# Patient Record
Sex: Male | Born: 1935 | Race: White | Hispanic: No | Marital: Married | Smoking: Never smoker
Health system: Southern US, Community
[De-identification: ages and names within clinical notes are randomized; demographics above are authoritative.]

## PROBLEM LIST (undated history)

## (undated) DIAGNOSIS — G459 Transient cerebral ischemic attack, unspecified: Secondary | ICD-10-CM

## (undated) DIAGNOSIS — I5022 Chronic systolic (congestive) heart failure: Secondary | ICD-10-CM

## (undated) DIAGNOSIS — I272 Pulmonary hypertension, unspecified: Secondary | ICD-10-CM

## (undated) DIAGNOSIS — I340 Nonrheumatic mitral (valve) insufficiency: Secondary | ICD-10-CM

## (undated) DIAGNOSIS — E785 Hyperlipidemia, unspecified: Secondary | ICD-10-CM

## (undated) DIAGNOSIS — A0472 Enterocolitis due to Clostridium difficile, not specified as recurrent: Secondary | ICD-10-CM

## (undated) DIAGNOSIS — I472 Ventricular tachycardia: Secondary | ICD-10-CM

## (undated) DIAGNOSIS — I10 Essential (primary) hypertension: Secondary | ICD-10-CM

## (undated) DIAGNOSIS — R600 Localized edema: Secondary | ICD-10-CM

## (undated) DIAGNOSIS — I4891 Unspecified atrial fibrillation: Secondary | ICD-10-CM

## (undated) DIAGNOSIS — R7989 Other specified abnormal findings of blood chemistry: Secondary | ICD-10-CM

## (undated) DIAGNOSIS — R55 Syncope and collapse: Secondary | ICD-10-CM

## (undated) DIAGNOSIS — Z952 Presence of prosthetic heart valve: Secondary | ICD-10-CM

## (undated) DIAGNOSIS — Z5189 Encounter for other specified aftercare: Secondary | ICD-10-CM

## (undated) DIAGNOSIS — I4821 Permanent atrial fibrillation: Secondary | ICD-10-CM

## (undated) DIAGNOSIS — I4729 Other ventricular tachycardia: Secondary | ICD-10-CM

## (undated) DIAGNOSIS — N183 Chronic kidney disease, stage 3 unspecified: Secondary | ICD-10-CM

## (undated) DIAGNOSIS — E119 Type 2 diabetes mellitus without complications: Secondary | ICD-10-CM

## (undated) DIAGNOSIS — I493 Ventricular premature depolarization: Secondary | ICD-10-CM

## (undated) DIAGNOSIS — IMO0001 Reserved for inherently not codable concepts without codable children: Secondary | ICD-10-CM

## (undated) DIAGNOSIS — R609 Edema, unspecified: Secondary | ICD-10-CM

## (undated) HISTORY — DX: Hyperlipidemia, unspecified: E78.5

## (undated) HISTORY — DX: Unspecified atrial fibrillation: I48.91

## (undated) HISTORY — DX: Ventricular tachycardia: I47.2

## (undated) HISTORY — DX: Other specified abnormal findings of blood chemistry: R79.89

## (undated) HISTORY — DX: Localized edema: R60.0

## (undated) HISTORY — DX: Pulmonary hypertension, unspecified: I27.20

## (undated) HISTORY — DX: Permanent atrial fibrillation: I48.21

## (undated) HISTORY — DX: Other ventricular tachycardia: I47.29

## (undated) HISTORY — DX: Chronic kidney disease, stage 3 unspecified: N18.30

## (undated) HISTORY — DX: Chronic systolic (congestive) heart failure: I50.22

## (undated) HISTORY — PX: MITRAL VALVE REPLACEMENT: SHX147

## (undated) HISTORY — DX: Type 2 diabetes mellitus without complications: E11.9

## (undated) HISTORY — DX: Nonrheumatic mitral (valve) insufficiency: I34.0

## (undated) HISTORY — DX: Ventricular premature depolarization: I49.3

## (undated) HISTORY — DX: Enterocolitis due to Clostridium difficile, not specified as recurrent: A04.72

## (undated) HISTORY — DX: Essential (primary) hypertension: I10

## (undated) HISTORY — DX: Reserved for inherently not codable concepts without codable children: IMO0001

## (undated) HISTORY — DX: Edema, unspecified: R60.9

## (undated) HISTORY — DX: Syncope and collapse: R55

## (undated) HISTORY — DX: Transient cerebral ischemic attack, unspecified: G45.9

## (undated) HISTORY — DX: Presence of prosthetic heart valve: Z95.2

---

## 2001-03-26 HISTORY — PX: CHOLECYSTECTOMY, LAPAROSCOPIC: SHX56

## 2001-12-24 ENCOUNTER — Encounter: Payer: Self-pay | Admitting: Surgery

## 2001-12-24 ENCOUNTER — Encounter: Admission: RE | Admit: 2001-12-24 | Discharge: 2001-12-24 | Payer: Self-pay | Admitting: Surgery

## 2001-12-25 DIAGNOSIS — IMO0001 Reserved for inherently not codable concepts without codable children: Secondary | ICD-10-CM

## 2001-12-25 HISTORY — DX: Reserved for inherently not codable concepts without codable children: IMO0001

## 2002-01-19 ENCOUNTER — Inpatient Hospital Stay (HOSPITAL_COMMUNITY): Admission: RE | Admit: 2002-01-19 | Discharge: 2002-01-28 | Payer: Self-pay | Admitting: Cardiology

## 2002-01-22 ENCOUNTER — Encounter: Payer: Self-pay | Admitting: Cardiology

## 2002-01-22 ENCOUNTER — Encounter: Payer: Self-pay | Admitting: Surgery

## 2002-01-22 ENCOUNTER — Encounter (INDEPENDENT_AMBULATORY_CARE_PROVIDER_SITE_OTHER): Payer: Self-pay | Admitting: *Deleted

## 2004-02-03 ENCOUNTER — Ambulatory Visit: Payer: Self-pay | Admitting: Internal Medicine

## 2004-02-15 ENCOUNTER — Ambulatory Visit: Payer: Self-pay | Admitting: Internal Medicine

## 2004-03-14 ENCOUNTER — Ambulatory Visit: Payer: Self-pay | Admitting: Internal Medicine

## 2004-04-12 ENCOUNTER — Ambulatory Visit: Payer: Self-pay

## 2004-05-12 ENCOUNTER — Ambulatory Visit: Payer: Self-pay | Admitting: Internal Medicine

## 2004-06-01 ENCOUNTER — Ambulatory Visit: Payer: Self-pay | Admitting: Internal Medicine

## 2004-06-09 ENCOUNTER — Ambulatory Visit: Payer: Self-pay | Admitting: Cardiology

## 2004-07-19 ENCOUNTER — Ambulatory Visit: Payer: Self-pay | Admitting: Cardiology

## 2004-08-16 ENCOUNTER — Ambulatory Visit: Payer: Self-pay | Admitting: *Deleted

## 2004-09-13 ENCOUNTER — Ambulatory Visit: Payer: Self-pay | Admitting: *Deleted

## 2004-10-11 ENCOUNTER — Ambulatory Visit: Payer: Self-pay | Admitting: Cardiology

## 2004-11-08 ENCOUNTER — Ambulatory Visit: Payer: Self-pay | Admitting: Cardiology

## 2004-12-07 ENCOUNTER — Ambulatory Visit: Payer: Self-pay | Admitting: Internal Medicine

## 2005-01-03 ENCOUNTER — Ambulatory Visit: Payer: Self-pay | Admitting: Cardiology

## 2005-01-19 ENCOUNTER — Ambulatory Visit: Payer: Self-pay | Admitting: Internal Medicine

## 2005-01-31 ENCOUNTER — Ambulatory Visit: Payer: Self-pay | Admitting: Cardiology

## 2005-02-21 ENCOUNTER — Ambulatory Visit: Payer: Self-pay | Admitting: Cardiology

## 2005-03-06 ENCOUNTER — Ambulatory Visit: Payer: Self-pay | Admitting: Cardiology

## 2005-03-21 ENCOUNTER — Ambulatory Visit: Payer: Self-pay | Admitting: *Deleted

## 2005-04-18 ENCOUNTER — Ambulatory Visit: Payer: Self-pay | Admitting: Cardiology

## 2005-04-26 ENCOUNTER — Ambulatory Visit: Payer: Self-pay | Admitting: Internal Medicine

## 2005-05-11 ENCOUNTER — Ambulatory Visit: Payer: Self-pay | Admitting: Cardiovascular Disease

## 2005-05-21 ENCOUNTER — Ambulatory Visit: Payer: Self-pay | Admitting: Internal Medicine

## 2005-06-08 ENCOUNTER — Ambulatory Visit: Payer: Self-pay | Admitting: Cardiovascular Disease

## 2005-07-05 ENCOUNTER — Ambulatory Visit: Payer: Self-pay | Admitting: Cardiology

## 2005-07-27 ENCOUNTER — Ambulatory Visit: Payer: Self-pay | Admitting: Cardiology

## 2005-08-24 ENCOUNTER — Ambulatory Visit: Payer: Self-pay | Admitting: Cardiology

## 2005-09-21 ENCOUNTER — Ambulatory Visit: Payer: Self-pay | Admitting: Cardiology

## 2005-10-12 ENCOUNTER — Ambulatory Visit: Payer: Self-pay | Admitting: Cardiology

## 2005-11-09 ENCOUNTER — Ambulatory Visit: Payer: Self-pay | Admitting: Internal Medicine

## 2005-11-19 ENCOUNTER — Ambulatory Visit: Payer: Self-pay | Admitting: Cardiology

## 2005-12-07 ENCOUNTER — Ambulatory Visit: Payer: Self-pay | Admitting: Cardiology

## 2005-12-28 ENCOUNTER — Ambulatory Visit: Payer: Self-pay | Admitting: Cardiovascular Disease

## 2006-01-25 ENCOUNTER — Ambulatory Visit: Payer: Self-pay | Admitting: Cardiology

## 2006-02-12 ENCOUNTER — Ambulatory Visit: Payer: Self-pay | Admitting: Cardiovascular Disease

## 2006-03-12 ENCOUNTER — Ambulatory Visit: Payer: Self-pay | Admitting: Cardiology

## 2006-04-09 ENCOUNTER — Ambulatory Visit: Payer: Self-pay | Admitting: Cardiology

## 2006-05-06 ENCOUNTER — Ambulatory Visit: Payer: Self-pay | Admitting: Cardiology

## 2006-05-28 ENCOUNTER — Ambulatory Visit: Payer: Self-pay | Admitting: Cardiology

## 2006-06-28 ENCOUNTER — Ambulatory Visit: Payer: Self-pay | Admitting: Cardiology

## 2006-07-26 ENCOUNTER — Ambulatory Visit: Payer: Self-pay | Admitting: Cardiology

## 2006-08-26 ENCOUNTER — Ambulatory Visit: Payer: Self-pay | Admitting: Cardiovascular Disease

## 2006-09-23 ENCOUNTER — Ambulatory Visit: Payer: Self-pay | Admitting: Cardiology

## 2006-10-14 ENCOUNTER — Ambulatory Visit: Payer: Self-pay | Admitting: Cardiology

## 2006-10-25 ENCOUNTER — Ambulatory Visit: Payer: Self-pay | Admitting: Internal Medicine

## 2006-11-06 ENCOUNTER — Ambulatory Visit: Payer: Self-pay | Admitting: Internal Medicine

## 2006-11-06 ENCOUNTER — Ambulatory Visit: Payer: Self-pay | Admitting: Cardiology

## 2006-11-22 ENCOUNTER — Ambulatory Visit: Payer: Self-pay | Admitting: Internal Medicine

## 2006-11-22 ENCOUNTER — Encounter: Payer: Self-pay | Admitting: Cardiology

## 2006-11-29 ENCOUNTER — Ambulatory Visit: Payer: Self-pay

## 2006-12-12 ENCOUNTER — Ambulatory Visit: Payer: Self-pay | Admitting: Cardiology

## 2007-01-10 ENCOUNTER — Ambulatory Visit: Payer: Self-pay | Admitting: Cardiology

## 2007-01-31 ENCOUNTER — Ambulatory Visit: Payer: Self-pay | Admitting: Cardiology

## 2007-01-31 ENCOUNTER — Encounter: Payer: Self-pay | Admitting: Internal Medicine

## 2007-01-31 DIAGNOSIS — I480 Paroxysmal atrial fibrillation: Secondary | ICD-10-CM | POA: Insufficient documentation

## 2007-01-31 DIAGNOSIS — I4891 Unspecified atrial fibrillation: Secondary | ICD-10-CM | POA: Insufficient documentation

## 2007-01-31 DIAGNOSIS — Z9889 Other specified postprocedural states: Secondary | ICD-10-CM | POA: Insufficient documentation

## 2007-01-31 DIAGNOSIS — G459 Transient cerebral ischemic attack, unspecified: Secondary | ICD-10-CM | POA: Insufficient documentation

## 2007-01-31 DIAGNOSIS — I2510 Atherosclerotic heart disease of native coronary artery without angina pectoris: Secondary | ICD-10-CM | POA: Insufficient documentation

## 2007-01-31 DIAGNOSIS — E785 Hyperlipidemia, unspecified: Secondary | ICD-10-CM | POA: Insufficient documentation

## 2007-01-31 DIAGNOSIS — I6350 Cerebral infarction due to unspecified occlusion or stenosis of unspecified cerebral artery: Secondary | ICD-10-CM | POA: Insufficient documentation

## 2007-01-31 DIAGNOSIS — I10 Essential (primary) hypertension: Secondary | ICD-10-CM | POA: Insufficient documentation

## 2007-01-31 DIAGNOSIS — Z8673 Personal history of transient ischemic attack (TIA), and cerebral infarction without residual deficits: Secondary | ICD-10-CM | POA: Insufficient documentation

## 2007-02-07 ENCOUNTER — Ambulatory Visit: Payer: Self-pay | Admitting: Internal Medicine

## 2007-02-08 DIAGNOSIS — E119 Type 2 diabetes mellitus without complications: Secondary | ICD-10-CM | POA: Insufficient documentation

## 2007-02-28 ENCOUNTER — Ambulatory Visit: Payer: Self-pay | Admitting: Cardiology

## 2007-03-28 ENCOUNTER — Ambulatory Visit: Payer: Self-pay | Admitting: Cardiology

## 2007-04-04 ENCOUNTER — Telehealth: Payer: Self-pay | Admitting: Internal Medicine

## 2007-04-25 ENCOUNTER — Telehealth: Payer: Self-pay | Admitting: Internal Medicine

## 2007-04-30 ENCOUNTER — Ambulatory Visit: Payer: Self-pay | Admitting: Internal Medicine

## 2007-05-01 ENCOUNTER — Encounter: Payer: Self-pay | Admitting: Internal Medicine

## 2007-05-30 ENCOUNTER — Ambulatory Visit: Payer: Self-pay | Admitting: Cardiology

## 2007-06-23 ENCOUNTER — Ambulatory Visit: Payer: Self-pay | Admitting: Internal Medicine

## 2007-06-23 DIAGNOSIS — R1013 Epigastric pain: Secondary | ICD-10-CM

## 2007-06-23 DIAGNOSIS — K3189 Other diseases of stomach and duodenum: Secondary | ICD-10-CM | POA: Insufficient documentation

## 2007-06-27 ENCOUNTER — Ambulatory Visit: Payer: Self-pay | Admitting: Cardiology

## 2007-07-25 ENCOUNTER — Ambulatory Visit: Payer: Self-pay | Admitting: Internal Medicine

## 2007-07-28 ENCOUNTER — Encounter: Payer: Self-pay | Admitting: Internal Medicine

## 2007-08-13 ENCOUNTER — Telehealth: Payer: Self-pay | Admitting: Internal Medicine

## 2007-08-22 ENCOUNTER — Ambulatory Visit: Payer: Self-pay | Admitting: Cardiology

## 2007-08-28 ENCOUNTER — Ambulatory Visit: Payer: Self-pay | Admitting: Internal Medicine

## 2007-09-19 ENCOUNTER — Ambulatory Visit: Payer: Self-pay | Admitting: Cardiology

## 2007-09-23 ENCOUNTER — Telehealth: Payer: Self-pay | Admitting: Internal Medicine

## 2007-10-02 ENCOUNTER — Encounter: Payer: Self-pay | Admitting: Internal Medicine

## 2007-10-02 ENCOUNTER — Telehealth: Payer: Self-pay | Admitting: Internal Medicine

## 2007-10-10 ENCOUNTER — Ambulatory Visit: Payer: Self-pay | Admitting: Cardiology

## 2007-10-22 ENCOUNTER — Encounter: Payer: Self-pay | Admitting: Internal Medicine

## 2007-10-27 ENCOUNTER — Encounter: Payer: Self-pay | Admitting: Internal Medicine

## 2007-10-29 ENCOUNTER — Ambulatory Visit: Payer: Self-pay | Admitting: Cardiology

## 2007-11-17 ENCOUNTER — Ambulatory Visit: Payer: Self-pay | Admitting: Internal Medicine

## 2007-11-17 ENCOUNTER — Ambulatory Visit: Payer: Self-pay | Admitting: Cardiology

## 2007-11-17 LAB — CONVERTED CEMR LAB
BUN: 15 mg/dL (ref 6–23)
Basophils Absolute: 0 10*3/uL (ref 0.0–0.1)
Basophils Relative: 0.2 % (ref 0.0–3.0)
CO2: 30 meq/L (ref 19–32)
Calcium: 9.3 mg/dL (ref 8.4–10.5)
Chloride: 105 meq/L (ref 96–112)
Creatinine, Ser: 0.9 mg/dL (ref 0.4–1.5)
Eosinophils Absolute: 0.4 10*3/uL (ref 0.0–0.7)
Eosinophils Relative: 5.4 % — ABNORMAL HIGH (ref 0.0–5.0)
GFR calc Af Amer: 107 mL/min
GFR calc non Af Amer: 88 mL/min
Glucose, Bld: 100 mg/dL — ABNORMAL HIGH (ref 70–99)
HCT: 46.7 % (ref 39.0–52.0)
Hemoglobin: 15.7 g/dL (ref 13.0–17.0)
Lymphocytes Relative: 16.3 % (ref 12.0–46.0)
MCHC: 33.7 g/dL (ref 30.0–36.0)
MCV: 98.2 fL (ref 78.0–100.0)
Monocytes Absolute: 0.7 10*3/uL (ref 0.1–1.0)
Monocytes Relative: 8.8 % (ref 3.0–12.0)
Neutro Abs: 5.7 10*3/uL (ref 1.4–7.7)
Neutrophils Relative %: 69.3 % (ref 43.0–77.0)
Platelets: 237 10*3/uL (ref 150–400)
Potassium: 3.8 meq/L (ref 3.5–5.1)
RBC: 4.75 M/uL (ref 4.22–5.81)
RDW: 14.5 % (ref 11.5–14.6)
Sodium: 141 meq/L (ref 135–145)
WBC: 8.1 10*3/uL (ref 4.5–10.5)

## 2007-12-16 ENCOUNTER — Ambulatory Visit: Payer: Self-pay | Admitting: Cardiology

## 2008-01-06 ENCOUNTER — Telehealth: Payer: Self-pay | Admitting: Internal Medicine

## 2008-01-13 ENCOUNTER — Ambulatory Visit: Payer: Self-pay | Admitting: Internal Medicine

## 2008-02-10 ENCOUNTER — Ambulatory Visit: Payer: Self-pay | Admitting: Cardiovascular Disease

## 2008-03-08 ENCOUNTER — Ambulatory Visit: Payer: Self-pay | Admitting: Cardiology

## 2008-04-08 ENCOUNTER — Ambulatory Visit: Payer: Self-pay | Admitting: Cardiovascular Disease

## 2008-04-14 ENCOUNTER — Telehealth: Payer: Self-pay | Admitting: Internal Medicine

## 2008-05-06 ENCOUNTER — Ambulatory Visit: Payer: Self-pay | Admitting: Cardiovascular Disease

## 2008-06-03 ENCOUNTER — Ambulatory Visit: Payer: Self-pay | Admitting: Cardiovascular Disease

## 2008-07-05 ENCOUNTER — Ambulatory Visit: Payer: Self-pay | Admitting: Internal Medicine

## 2008-08-02 ENCOUNTER — Ambulatory Visit: Payer: Self-pay | Admitting: Cardiovascular Disease

## 2008-08-24 ENCOUNTER — Encounter: Payer: Self-pay | Admitting: *Deleted

## 2008-09-02 ENCOUNTER — Ambulatory Visit: Payer: Self-pay | Admitting: Internal Medicine

## 2008-09-06 ENCOUNTER — Ambulatory Visit: Payer: Self-pay | Admitting: Cardiology

## 2008-09-06 LAB — CONVERTED CEMR LAB
POC INR: 3.4
Protime: 22.2

## 2008-09-08 ENCOUNTER — Telehealth: Payer: Self-pay | Admitting: Internal Medicine

## 2008-09-29 ENCOUNTER — Encounter: Payer: Self-pay | Admitting: *Deleted

## 2008-10-04 ENCOUNTER — Ambulatory Visit: Payer: Self-pay | Admitting: Cardiology

## 2008-10-04 ENCOUNTER — Encounter (INDEPENDENT_AMBULATORY_CARE_PROVIDER_SITE_OTHER): Payer: Self-pay | Admitting: Cardiology

## 2008-10-04 LAB — CONVERTED CEMR LAB
POC INR: 5.1
Prothrombin Time: 27.3 s

## 2008-10-25 ENCOUNTER — Encounter (INDEPENDENT_AMBULATORY_CARE_PROVIDER_SITE_OTHER): Payer: Self-pay | Admitting: Cardiology

## 2008-10-25 ENCOUNTER — Ambulatory Visit: Payer: Self-pay | Admitting: Internal Medicine

## 2008-10-25 LAB — CONVERTED CEMR LAB
POC INR: 2.5
Prothrombin Time: 19.2 s

## 2008-11-03 ENCOUNTER — Encounter: Payer: Self-pay | Admitting: Cardiology

## 2008-11-09 ENCOUNTER — Ambulatory Visit: Payer: Self-pay | Admitting: Cardiology

## 2008-11-22 ENCOUNTER — Ambulatory Visit: Payer: Self-pay | Admitting: Cardiovascular Disease

## 2008-11-22 ENCOUNTER — Encounter: Payer: Self-pay | Admitting: Cardiology

## 2008-11-22 ENCOUNTER — Ambulatory Visit: Payer: Self-pay

## 2008-11-22 LAB — CONVERTED CEMR LAB: POC INR: 2.8

## 2008-12-20 ENCOUNTER — Ambulatory Visit: Payer: Self-pay | Admitting: Cardiology

## 2008-12-20 LAB — CONVERTED CEMR LAB: POC INR: 3.3

## 2009-01-17 ENCOUNTER — Ambulatory Visit: Payer: Self-pay | Admitting: Cardiovascular Disease

## 2009-02-16 ENCOUNTER — Ambulatory Visit: Payer: Self-pay | Admitting: Internal Medicine

## 2009-03-16 ENCOUNTER — Ambulatory Visit: Payer: Self-pay | Admitting: Internal Medicine

## 2009-03-28 ENCOUNTER — Telehealth: Payer: Self-pay | Admitting: Cardiology

## 2009-03-28 ENCOUNTER — Encounter: Payer: Self-pay | Admitting: Cardiology

## 2009-04-07 ENCOUNTER — Ambulatory Visit: Payer: Self-pay | Admitting: Internal Medicine

## 2009-04-07 ENCOUNTER — Telehealth: Payer: Self-pay | Admitting: Internal Medicine

## 2009-04-08 ENCOUNTER — Ambulatory Visit: Payer: Self-pay | Admitting: Internal Medicine

## 2009-04-20 ENCOUNTER — Encounter: Payer: Self-pay | Admitting: Internal Medicine

## 2009-04-21 ENCOUNTER — Encounter: Payer: Self-pay | Admitting: Internal Medicine

## 2009-05-05 ENCOUNTER — Ambulatory Visit: Payer: Self-pay | Admitting: Cardiology

## 2009-05-05 LAB — CONVERTED CEMR LAB: POC INR: 3.1

## 2009-06-02 ENCOUNTER — Ambulatory Visit: Payer: Self-pay | Admitting: Cardiology

## 2009-06-02 LAB — CONVERTED CEMR LAB: POC INR: 3.3

## 2009-06-30 ENCOUNTER — Ambulatory Visit: Payer: Self-pay | Admitting: Cardiovascular Disease

## 2009-06-30 LAB — CONVERTED CEMR LAB: POC INR: 3.2

## 2009-07-07 ENCOUNTER — Ambulatory Visit: Payer: Self-pay | Admitting: Internal Medicine

## 2009-07-07 DIAGNOSIS — H9190 Unspecified hearing loss, unspecified ear: Secondary | ICD-10-CM | POA: Insufficient documentation

## 2009-07-28 ENCOUNTER — Ambulatory Visit: Payer: Self-pay | Admitting: Cardiology

## 2009-08-04 ENCOUNTER — Ambulatory Visit (HOSPITAL_COMMUNITY): Admission: RE | Admit: 2009-08-04 | Discharge: 2009-08-04 | Payer: Self-pay | Admitting: Internal Medicine

## 2009-08-04 ENCOUNTER — Ambulatory Visit: Payer: Self-pay | Admitting: Internal Medicine

## 2009-08-04 DIAGNOSIS — H8309 Labyrinthitis, unspecified ear: Secondary | ICD-10-CM | POA: Insufficient documentation

## 2009-08-05 ENCOUNTER — Telehealth: Payer: Self-pay | Admitting: Internal Medicine

## 2009-08-08 ENCOUNTER — Telehealth: Payer: Self-pay | Admitting: Internal Medicine

## 2009-08-11 ENCOUNTER — Ambulatory Visit: Payer: Self-pay | Admitting: Internal Medicine

## 2009-08-11 LAB — CONVERTED CEMR LAB: POC INR: 3

## 2009-08-25 ENCOUNTER — Encounter: Payer: Self-pay | Admitting: Internal Medicine

## 2009-09-08 ENCOUNTER — Ambulatory Visit: Payer: Self-pay | Admitting: Cardiology

## 2009-09-08 LAB — CONVERTED CEMR LAB: POC INR: 2.6

## 2009-10-06 ENCOUNTER — Ambulatory Visit: Payer: Self-pay | Admitting: Internal Medicine

## 2009-10-25 ENCOUNTER — Ambulatory Visit: Payer: Self-pay | Admitting: Cardiology

## 2009-11-02 ENCOUNTER — Telehealth: Payer: Self-pay | Admitting: Cardiology

## 2009-11-04 ENCOUNTER — Ambulatory Visit: Payer: Self-pay | Admitting: Internal Medicine

## 2009-12-02 ENCOUNTER — Ambulatory Visit: Payer: Self-pay | Admitting: Internal Medicine

## 2009-12-23 ENCOUNTER — Ambulatory Visit: Payer: Self-pay | Admitting: Internal Medicine

## 2009-12-26 ENCOUNTER — Ambulatory Visit: Payer: Self-pay | Admitting: Internal Medicine

## 2010-01-24 ENCOUNTER — Ambulatory Visit: Payer: Self-pay | Admitting: Cardiovascular Disease

## 2010-01-24 LAB — CONVERTED CEMR LAB: POC INR: 3.5

## 2010-02-21 ENCOUNTER — Ambulatory Visit: Payer: Self-pay | Admitting: Cardiology

## 2010-02-21 LAB — CONVERTED CEMR LAB: POC INR: 3.1

## 2010-02-28 ENCOUNTER — Encounter: Payer: Self-pay | Admitting: Physician Assistant

## 2010-02-28 ENCOUNTER — Ambulatory Visit: Payer: Self-pay | Admitting: Cardiovascular Disease

## 2010-02-28 DIAGNOSIS — R079 Chest pain, unspecified: Secondary | ICD-10-CM | POA: Insufficient documentation

## 2010-03-06 ENCOUNTER — Telehealth (INDEPENDENT_AMBULATORY_CARE_PROVIDER_SITE_OTHER): Payer: Self-pay

## 2010-03-07 ENCOUNTER — Encounter (HOSPITAL_COMMUNITY)
Admission: RE | Admit: 2010-03-07 | Discharge: 2010-04-25 | Payer: Self-pay | Source: Home / Self Care | Attending: Cardiology | Admitting: Cardiology

## 2010-03-07 ENCOUNTER — Encounter: Payer: Self-pay | Admitting: Cardiology

## 2010-03-07 ENCOUNTER — Ambulatory Visit: Payer: Self-pay

## 2010-03-14 ENCOUNTER — Encounter: Payer: Self-pay | Admitting: Cardiology

## 2010-03-14 ENCOUNTER — Ambulatory Visit: Payer: Self-pay | Admitting: Cardiology

## 2010-03-21 ENCOUNTER — Ambulatory Visit: Payer: Self-pay | Admitting: Internal Medicine

## 2010-03-21 LAB — CONVERTED CEMR LAB: POC INR: 3.1

## 2010-04-10 ENCOUNTER — Telehealth: Payer: Self-pay | Admitting: Cardiology

## 2010-04-10 ENCOUNTER — Encounter: Payer: Self-pay | Admitting: Cardiology

## 2010-04-18 ENCOUNTER — Ambulatory Visit: Admit: 2010-04-18 | Payer: Self-pay

## 2010-04-25 ENCOUNTER — Ambulatory Visit: Admission: RE | Admit: 2010-04-25 | Discharge: 2010-04-25 | Payer: Self-pay | Source: Home / Self Care

## 2010-04-25 LAB — CONVERTED CEMR LAB: POC INR: 2.9

## 2010-04-25 NOTE — Medication Information (Signed)
Summary: rov/tm  Anticoagulant Therapy  Managed by: [name], RN, BSN Referring MD: Charlies Constable MD PCP: Link Snuffer MD: Graciela Husbands MD, Viviann Spare Indication 1: Mitral Valve Replacement (ICD-V43.3) Indication 2: TIA (ICD-435.9) Lab Used: [facility] Site: [facility] INR POC 3.2 INR RANGE 3-3.5  Dietary changes: no    Health status changes: no    Bleeding/hemorrhagic complications: no    Recent/future hospitalizations: no    Any changes in medication regimen? no    Recent/future dental: no  Any missed doses?: yes     Details: Missed 1 dosage 09/22/09  Is patient compliant with meds? yes       Allergies: 1)  ! Sulfa  Anticoagulation Management History:      The patient is taking warfarin and comes in today for a routine follow up visit.  Positive risk factors for bleeding include an age of 75 years or older, history of CVA/TIA, and presence of serious comorbidities.  The bleeding index is 'high risk'.  Positive CHADS2 values include History of HTN, History of Diabetes, and Prior Stroke/CVA/TIA.  Negative CHADS2 values include Age > 75 years old.  The start date was 06/18/1997.  Anticoagulation responsible provider: Graciela Husbands MD, Viviann Spare.  INR POC: 3.2.  Cuvette Lot#: [id number].  Exp: 11/2010.    Anticoagulation Management Assessment/Plan:      The patient's current anticoagulation dose is Warfarin sodium 5 mg tabs: Use as directed by Anticoagulation Clinic.  The target INR is 3-3.5.  The next INR is due 11/03/2009.  Anticoagulation instructions were given to patient.  Results were reviewed/authorized by [name], RN, BSN.  He was notified by [name] RN.         Prior Anticoagulation Instructions: INR 2.6 Today take 10mg s , on Friday take 7.5mg s then resume 7.5mg s daily except 5mg s on Mondays, Wednesdays and  Fridays. Recheck in 4 weeks.   Current Anticoagulation Instructions: INR 3.2  Continue on same dosage 7.5mg  daily except 5mg  on MOndays, Wednesdays, and  Fridays.  Recheck in 4 weeks.

## 2010-04-25 NOTE — Assessment & Plan Note (Signed)
Summary: HAD DOUBLE VISION AT LUNCH TIME THUR/ CARDIOLOGY ADVISED HER ...   Vital Signs:  Patient profile:   77 year old male Height:      69 inches Weight:      192 pounds BMI:     28.46 O2 Sat:      98 % on Room air Temp:     98.2 degrees F oral Pulse rate:   110 / minute BP sitting:   144 / 70  (left arm) Cuff size:   large  Vitals Entered By: Bill Salinas CMA (April 08, 2009 10:36 AM)  O2 Flow:  Room air CC: pt had an episode yesterday of "double vision" lasting about 1 min. He also states his BP has been elevated, pt denies any other symptoms at the time of episode/ ab   Primary Care Provider:  [facility]  CC:  pt had an episode yesterday of "double vision" lasting about 1 min. He also states his BP has been elevated and pt denies any other symptoms at the time of episode/ ab.  History of Present Illness: Patient presents after having a brief, 3-5 minutes, of double vision. He had no other symptoms: no HA, paresthesia, weakness, tremor or other focal neurologic symptoms. He is on a full anticoagulation regimen of persantine, warfarin and aspirin.  Current Medications (verified): 1)  Persantine 50 Mg  Tabs (Dipyridamole) .... Take 1 By Mouth Three Times A Day Qd 2)  Enalapril Maleate 20 Mg  Tabs (Enalapril Maleate) .[name].. 1 Two Times A Day 3)  Allopurinol 300 Mg  Tabs (Allopurinol) .... Take 1 By Mouth Qd 4)  Nexium 40 Mg  Cpdr (Esomeprazole Magnesium) .... Take 1 By Mouth Once Daily Prn 5)  Lipitor 20 Mg  Tabs (Atorvastatin Calcium) .... Take 1 By Mouth Qd 6)  Norvasc 10 Mg  Tabs (Amlodipine Besylate) .... Take 1 By Mouth Qd 7)  Adult Aspirin Low Strength 81 Mg  Tbdp (Aspirin) .[name].. 1 By Mouth Once Daily 8)  Furosemide 40 Mg Tabs (Furosemide) .... Take 1 Tablet By Mouth Once A Day 9)  Warfarin Sodium 5 Mg Tabs (Warfarin Sodium) .... Use As Directed By Anticoagulation Clinic  Allergies (verified): 1)  ! Sulfa  Past History:  Past Medical History: Last updated:  11/07/2008 Hyperlipidemia Hypertension Atrial fibrillation-last Echo 11/22/06- EF 50-55%, mild dilation LV, mild MR w/ gradient Coronary artery disease- last Nuclear Stress 12/25/01 with no ischemia Diabetes mellitus, type II-lifestyle management Peripheral edema-venous insufficiency Elevated TSH, hx of Transient Ischemic attacks, hx of while on coumadin Ventricular ectopy, hx of- symptomatic 1. 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 2. Chronic atrial fibrillation, on Coumadin therapy. 3. History of recurrent transient ischemic attacks, now on Coumadin,     aspirin, and Persantine. 4. Hypertension. 5. Hyperlipidemia. 6. Positive family history of coronary heart disease.  Physician Roster:      Card - B. Juanda Chance      GS-Streck  Past Surgical History: Last updated: 02/07/2007 Mitral valve replacement -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 Cholecystectomy, laparoscopic '03  Family History: Last updated: December 04, 2008 Father - deceased @ 36:  CAD/MI,  Mother - deceased @ 3: Died secondary to blood clot Neg - colon or prostate cancer; positive for CAD; DM  Social History: Last updated: 09/02/2008 HSG, many management courses married '58 2 sons - '61, '62: 1 grandchild Work- Psychologist, educational in Designer, fashion/clothing  Risk Factors: Smoking Status: never (01/31/2007)  Review of Systems  The patient denies anorexia, fever, chest pain, syncope, headaches, abdominal pain, muscle weakness, transient blindness, difficulty walking, depression, and enlarged lymph nodes.    Physical Exam  General:  Well-developed,well-nourished,in no acute distress; alert,appropriate and cooperative throughout examination Head:  Normocephalic and atraumatic without obvious abnormalities. No apparent alopecia or balding. Eyes:  corneas and lenses clear and no injection.   Lungs:  normal respiratory effort.   Heart:  normal rate and regular rhythm.    Neurologic:  alert & oriented X3, cranial nerves II-XII intact, and gait normal.   Skin:  turgor normal and color normal.   Psych:  Oriented X3, memory intact for recent and remote, normally interactive, and good eye contact.     Impression & Recommendations:  Problem # 1:  TRANSIENT ISCHEMIC ATTACK (ICD-435.9) Patient with transient visual change-diploplia with full recovery and no sequellae. He is fully anticoagulated. We discussed the potential causes including carotid artery disease. No indication for neuro imaging given that he is on warfarin and persantine.  Plan - watchfull waiting. For recurrent symptoms will get a carotid doppler.  His updated medication list for this problem includes:    Persantine 50 Mg Tabs (Dipyridamole) .[name]... Take 1 by mouth three times a day qd    Adult Aspirin Low Strength 81 Mg Tbdp (Aspirin) .[name]... 1 by mouth once daily    Warfarin Sodium 5 Mg Tabs (Warfarin sodium) ..... Use as directed by anticoagulation clinic  Complete Medication List: 1)  Persantine 50 Mg Tabs (Dipyridamole) .... Take 1 by mouth three times a day qd 2)  Enalapril Maleate 20 Mg Tabs (Enalapril maleate) .[name].. 1 two times a day 3)  Allopurinol 300 Mg Tabs (Allopurinol) .... Take 1 by mouth qd 4)  Nexium 40 Mg Cpdr (Esomeprazole magnesium) .... Take 1 by mouth once daily prn 5)  Lipitor 20 Mg Tabs (Atorvastatin calcium) .... Take 1 by mouth qd 6)  Norvasc 10 Mg Tabs (Amlodipine besylate) .... Take 1 by mouth qd 7)  Adult Aspirin Low Strength 81 Mg Tbdp (Aspirin) .[name].. 1 by mouth once daily 8)  Furosemide 40 Mg Tabs (Furosemide) .... Take 1 tablet by mouth once a day 9)  Warfarin Sodium 5 Mg Tabs (Warfarin sodium) .... Use as directed by anticoagulation clinic   Preventive Care Screening  Last Flu Shot:    Date:  12/28/2008    Results:  given

## 2010-04-25 NOTE — Assessment & Plan Note (Signed)
Summary: FLU SHOT /MEN /NWS   Nurse Visit   Allergies: 1)  ! Sulfa  Orders Added: 1)  Flu Vaccine 3yrs + MEDICARE PATIENTS [Q2039] 2)  Administration Flu vaccine - MCR [G0008] Flu Vaccine Consent Questions     Do you have a history of severe allergic reactions to this vaccine? no    Any prior history of allergic reactions to egg and/or gelatin? no    Do you have a sensitivity to the preservative Thimersol? no    Do you have a past history of Guillan-Barre Syndrome? no    Do you currently have an acute febrile illness? no    Have you ever had a severe reaction to latex? no    Vaccine information given and explained to patient? yes    Are you currently pregnant? no    Lot Number:[id number]   Exp Date:09/23/2010   Site Given  Left Deltoid IM.lbmedflu 

## 2010-04-25 NOTE — Progress Notes (Signed)
Summary: Querstion about medication  Phone Note From Pharmacy Call back at [phone]   Caller: [facility] ref# [phone] Summary of Call: Question about Warfarin Tabs 5mg  Initial call taken by: Judie Grieve,  November 02, 2009 1:01 PM  Follow-up for Phone Call        Spoke with pharmacist at [facility].  Question about coumadin Rx and number of tablets.  Verifed dose.  They will get Rx ready for pt.  Follow-up by: Weston Brass PharmD,  November 02, 2009 3:35 PM

## 2010-04-25 NOTE — Assessment & Plan Note (Signed)
Summary: pt having CP off and on/lg   Anthony Gould:  Anthony Gould   History of Present Illness: Anthony Cardiologist:  Dr. Charlies Constable (he will be est. with Dr. Shirlee Latch in Dr. Regino Schultze retirement)  Anthony Gould is a 77 yo male with a h/o permanent AFib on chronic coumadin therapy.  He has a h/o recurrent TIAs while on coumadin and has also been placed on Persantine.  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  His last myoview study was 2003.  His last echo was in Aug. 2010 and demonstrated an EF 50-55%; normally functioning mechanical MV prosthesis, mod BAE and PASP 40 mmHg.  He presents today with chest pain.  Over the last several weeks, he describes chest discomfort off and on that he attributes to his past history of GERD/hiatal hernia.  He has had this over the years.  He has usually been able to obtain relief with extra doses of Nexium.  About 2 weeks ago, he noted some discomfort in his chest that he describes as a tightness while walking on the treadmill.  This symptom was similar to what he had in the past with his acid reflux.  He has not gone back to walking on the treadmill since.   He had been taking 2 Nexium a day for a while.  He then started cutting back on this to 2 pills every other day, et Karie Soda.  Three days ago, the patient had chest discomfort that lasted several hours.  The pain was gone when he awoke in the morning.  He did note some discomfort in his back.  Again, he has had back discomfort with his acid reflux in the past.  His diet is poor in terms of acid reflux prevention.  He denies any symptoms radiating to his arms or jaw.  He denies any associated shortness of breath.  He denies any associated nausea or diaphoresis.  He denies any orthopnea, PND or edema.  He denies any syncope or near syncope.  He has felt normal since his episode of pain 3 days ago.   Current Medications (verified): 1)  Persantine 50 Mg  Tabs (Dipyridamole) .... Take 1 By Mouth  Three Times A Day Qd 2)  Enalapril Maleate 20 Mg  Tabs (Enalapril Maleate) .[name].. 1 Two Times A Day 3)  Allopurinol 300 Mg  Tabs (Allopurinol) .... Take 1 By Mouth Qd 4)  Nexium 40 Mg  Cpdr (Esomeprazole Magnesium) .... Take 1 By Mouth Once Daily Prn 5)  Lipitor 20 Mg  Tabs (Atorvastatin Calcium) .... Take 1 By Mouth Qd 6)  Norvasc 10 Mg  Tabs (Amlodipine Besylate) .... Take 1 By Mouth Qd 7)  Adult Aspirin Low Strength 81 Mg  Tbdp (Aspirin) .[name].. 1 By Mouth Once Daily 8)  Furosemide 40 Mg Tabs (Furosemide) .... Take 1 Tablet By Mouth Once A Day 9)  Warfarin Sodium 5 Mg Tabs (Warfarin Sodium) .... Use As Directed By Anticoagulation Clinic 10)  Magox 400 Otc .... Take 1 Tab Three Times A Day  Allergies (verified): 1)  ! Sulfa  Past History:  Past Medical History: Hyperlipidemia Hypertension Atrial fibrillation-last Echo 11/22/06- EF 50-55%, mild dilation LV, mild MR w/ gradient   a.  Echo 10/2008:  EF 50-55%; mech MV prosthesis; mod BAE; mild to mod TR; PASP 40 mmHg Nuclear Stress 12/25/01 with no ischemia Diabetes mellitus, type II-lifestyle management Peripheral edema-venous insufficiency Elevated TSH, hx of Transient Ischemic attacks, hx of while on  coumadin Ventricular ectopy, hx of- symptomatic 1. 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 2. Chronic atrial fibrillation, on Coumadin therapy. 3. History of recurrent transient ischemic attacks, now on Coumadin,     aspirin, and Persantine. 4. Hypertension. 5. Hyperlipidemia. 6. Positive family history of coronary heart disease.  Physician Roster:      Card - B. Brodie      GS-Streck  Family History: Reviewed history from 11/08/2008 and no changes required. Father - deceased @ 54:  CAD/MI,  Mother - deceased @ 12: Died secondary to blood clot Neg - colon or prostate cancer; positive for CAD; DM  Social History: Reviewed history from 09/02/2008 and no changes  required. HSG, many management courses married '58 2 sons - '61, '62: 1 grandchild Work- Psychologist, educational in Designer, fashion/clothing nonsmoker  Review of Systems       As per  the HPI.  All other systems reviewed and negative.   Vital Signs:  Patient profile:   77 year old male Height:      69 inches Weight:      189 pounds BMI:     28.01 Pulse rate:   100 / minute Resp:     16 per minute BP sitting:   143 / 77  (right arm)  Vitals Entered By: Anthony Coy, CNA (February 28, 2010 9:51 AM)  Physical Exam  General:  Well nourished, well developed, in no acute distress HEENT: normal Neck: no JVD Cardiac:  mechanical S1, normal S2; RRR; no murmur Lungs:  clear to auscultation bilaterally, no wheezing, rhonchi or rales Abd: soft, nontender, no hepatomegaly Ext: no edema Vascular: no carotid  bruits Skin: warm and dry Neuro:  CNs 2-12 intact, no focal abnormalities noted Lymph: no adenopathy Endo: no thyromegaly   EKG  Procedure date:  02/28/2010  Findings:      Atrial Fibrillation Heart rate 102 Left axis deviation Nonspecific ST-T wave changes Rare PVC  Impression & Recommendations:  Problem # 1:  CHEST PAIN (ICD-786.50)  Symptoms with atypical > typical features. He had normal cors at his cath 25 years ago prior to his MVR. He had a negative stress test in 2003.   His ECG is unchanged from prior tracing in 2010. He is at significant risk for CAD with HTN, DM2, HLP, +FHx. After a long discussion with the patient, we will proceed with Lexiscan Myoview. He will follow up with Dr. Juanda Chance after his test. If his stress test is ok, I would suggest he consider seeing GI to further evaluate his symptoms and make recommendations (?EGD).  Orders: Nuclear Stress Test (Nuc Stress Test)  Problem # 2:  ATRIAL FIBRILLATION (ICD-427.31)  HR 102 today. He has been reluctant to take rate controlling meds in the past. He follows with our coumadin clinic for INRs.  Orders: EKG w/  Interpretation (93000)  Problem # 3:  MITRAL VALVE REPLACEMENT, HX OF (ICD-V15.1) Normal MV prosthesis on echo 10/2008. He is on coumadin.  Problem # 4:  HYPERTENSION (ICD-401.9)  BP elevated today. I suspect if he would consider something like Cardizem instead of amlodipine, he would probably have better BP control. Monitor for now.  Patient Instructions: 1)  Your physician recommends that you schedule a follow-up appointment in: 2 weeks with Dr. Juanda Chance after having Myoview 2)  Your physician has requested that you have an Laxiscan myoview.  For further information please visit [email or web address]  Please follow instruction  sheet, as given. 3)  Your physician recommends that you continue on your current medications as directed. Please refer to the Current Medication list given to you today.

## 2010-04-25 NOTE — Medication Information (Signed)
Summary: rov/sl  Anticoagulant Therapy  Managed by: [name], RN, BSN Referring MD: Charlies Constable MD PCP: Link Snuffer MD: Clifton Jaxsin MD, Cristal Deer Indication 1: Mitral Valve Replacement (ICD-V43.3) Indication 2: TIA (ICD-435.9) Lab Used: [facility] Site: [facility] INR POC 3.5 INR RANGE 3-3.5  Dietary changes: no    Health status changes: no    Bleeding/hemorrhagic complications: no    Recent/future hospitalizations: no    Any changes in medication regimen? no    Recent/future dental: no  Any missed doses?: no       Is patient compliant with meds? yes       Allergies: 1)  ! Sulfa  Anticoagulation Management History:      The patient is taking warfarin and comes in today for a routine follow up visit.  Positive risk factors for bleeding include an age of 75 years or older, history of CVA/TIA, and presence of serious comorbidities.  The bleeding index is 'high risk'.  Positive CHADS2 values include History of HTN, History of Diabetes, and Prior Stroke/CVA/TIA.  Negative CHADS2 values include Age > 76 years old.  The start date was 06/18/1997.  Anticoagulation responsible provider: Clifton Imanol MD, Cristal Deer.  INR POC: 3.5.  Cuvette Lot#: [id number].  Exp: 01/2011.    Anticoagulation Management Assessment/Plan:      The patient's current anticoagulation dose is Warfarin sodium 5 mg tabs: Use as directed by Anticoagulation Clinic.  The target INR is 3-3.5.  The next INR is due 02/21/2010.  Anticoagulation instructions were given to patient.  Results were reviewed/authorized by [name], RN, BSN.  He was notified by [name] RN.         Prior Anticoagulation Instructions: INR 3.5  Continue taking Coumadin 1.5 tabs (7.5 mg) on Sun, Tues, Thur, Sat and Coumadin 1 tab (5 mg) on Mon, Wed, Fri. Return to clinic in  4 weeks.   Current Anticoagulation Instructions: INR 3.5  Continue on same dosage 1.5 tablets daily except 1 tablet on Mondays, Wednesdays, and  Fridays. Recheck in 4 weeks.

## 2010-04-25 NOTE — Medication Information (Signed)
Summary: rov/jm  Anticoagulant Therapy  Managed by: Reina Fuse, PharmD Referring MD: Charlies Constable MD PCP: Link Snuffer MD: Gala Romney MD, Reuel Boom Indication 1: Mitral Valve Replacement (ICD-V43.3) Indication 2: TIA (ICD-435.9) Lab Used: [facility] Site: [facility] INR RANGE 3-3.5  Dietary changes: no    Health status changes: no    Bleeding/hemorrhagic complications: no    Recent/future hospitalizations: no    Any changes in medication regimen? no    Recent/future dental: no  Any missed doses?: no       Is patient compliant with meds? yes       Current Medications (verified): 1)  Persantine 50 Mg  Tabs (Dipyridamole) .... Take 1 By Mouth Three Times A Day Qd 2)  Enalapril Maleate 20 Mg  Tabs (Enalapril Maleate) .[name].. 1 Two Times A Day 3)  Allopurinol 300 Mg  Tabs (Allopurinol) .... Take 1 By Mouth Qd 4)  Nexium 40 Mg  Cpdr (Esomeprazole Magnesium) .... Take 1 By Mouth Once Daily Prn 5)  Lipitor 20 Mg  Tabs (Atorvastatin Calcium) .... Take 1 By Mouth Qd 6)  Norvasc 10 Mg  Tabs (Amlodipine Besylate) .... Take 1 By Mouth Qd 7)  Adult Aspirin Low Strength 81 Mg  Tbdp (Aspirin) .[name].. 1 By Mouth Once Daily 8)  Furosemide 40 Mg Tabs (Furosemide) .... Take 1 Tablet By Mouth Once A Day 9)  Warfarin Sodium 5 Mg Tabs (Warfarin Sodium) .... Use As Directed By Anticoagulation Clinic 10)  Magox 400 Otc .... Take 1 Tab Three Times A Day  Allergies (verified): 1)  ! Sulfa  Anticoagulation Management History:      The patient is taking warfarin and comes in today for a routine follow up visit.  Positive risk factors for bleeding include an age of 8 years or older, history of CVA/TIA, and presence of serious comorbidities.  The bleeding index is 'high risk'.  Positive CHADS2 values include History of HTN, History of Diabetes, and Prior Stroke/CVA/TIA.  Negative CHADS2 values include Age > 41 years old.  The start date was 06/18/1997.  Anticoagulation responsible Elizah Lydon: Bensimhon  MD, Reuel Boom.  Cuvette Lot#: [id number].  Exp: 01/2011.    Anticoagulation Management Assessment/Plan:      The patient's current anticoagulation dose is Warfarin sodium 5 mg tabs: Use as directed by Anticoagulation Clinic.  The target INR is 3-3.5.  The next INR is due 01/23/2010.  Anticoagulation instructions were given to patient.  Results were reviewed/authorized by Reina Fuse, PharmD.  He was notified by Reina Fuse PharmD.         Prior Anticoagulation Instructions: INR 4.2  Take one-half tablet tomorrow, then resume one and one-half tablets every day except one tablet Monday, Wednesday, and Friday.  We will see you in three weeks.  Current Anticoagulation Instructions: INR 3.5  Continue taking Coumadin 1.5 tabs (7.5 mg) on Sun, Tues, Thur, Sat and Coumadin 1 tab (5 mg) on Mon, Wed, Fri. Return to clinic in  4 weeks.

## 2010-04-25 NOTE — Medication Information (Signed)
Summary: rov/sp  Anticoagulant Therapy  Managed by: Eda Keys, PharmD Referring MD: Charlies Constable MD PCP: Link Snuffer MD: Tenny Craw MD, Gunnar Fusi Indication 1: Mitral Valve Replacement (ICD-V43.3) Indication 2: TIA (ICD-435.9) Lab Used: [facility] Site: [facility] INR RANGE 3-3.5  Dietary changes: no    Health status changes: no    Bleeding/hemorrhagic complications: no    Recent/future hospitalizations: no    Any changes in medication regimen? no    Recent/future dental: no  Any missed doses?: no       Is patient compliant with meds? yes       Allergies: 1)  ! Sulfa  Anticoagulation Management History:      The patient is taking warfarin and comes in today for a routine follow up visit.  Positive risk factors for bleeding include an age of 75 years or older, history of CVA/TIA, and presence of serious comorbidities.  The bleeding index is 'high risk'.  Positive CHADS2 values include History of HTN, History of Diabetes, and Prior Stroke/CVA/TIA.  Negative CHADS2 values include Age > 4 years old.  The start date was 06/18/1997.  Anticoagulation responsible Ambrea Hegler: Tenny Craw MD, Gunnar Fusi.  Cuvette Lot#: [id number].  Exp: 01/2011.    Anticoagulation Management Assessment/Plan:      The patient's current anticoagulation dose is Warfarin sodium 5 mg tabs: Use as directed by Anticoagulation Clinic.  The target INR is 3-3.5.  The next INR is due 12/26/2009.  Anticoagulation instructions were given to patient.  Results were reviewed/authorized by Eda Keys, PharmD.  He was notified by Kennieth Francois.         Prior Anticoagulation Instructions: INR 3.2  Continue with 1.5 tablets daily except 1 tablet Mon, Wed, and Fri.  Return to clinic in 4 weeks.   Current Anticoagulation Instructions: INR 4.2  Take one-half tablet tomorrow, then resume one and one-half tablets every day except one tablet Monday, Wednesday, and Friday.  We will see you in three weeks.

## 2010-04-25 NOTE — Progress Notes (Signed)
Summary: RESULTS  Phone Note Call from Patient Call back at Home Phone [phone] Call back at Work Phone [phone]   Summary of Call: Patient is requesting results of scan.  Initial call taken by: Lamar Sprinkles, CMA,  Aug 05, 2009 4:30 PM

## 2010-04-25 NOTE — Assessment & Plan Note (Signed)
Summary: PER CHECK OUT   Visit Type:  Follow-up Primary Provider:  Norins  CC:  none.  History of Present Illness: The patient is a 77 years old and return for management of atrial fibrillation and his prosthetic valve. He had a Hall mitral prosthesis placed in [place] many years ago he he has had TIAs while on Coumadin and is now on Persantin as well. He has chronic atrial fibrillation.  He has been doing quite well with no chest pain shortness of breath or palpitations. He had an echo not too5 and good prosthhowed an ejection fraction of 55% and good prosthetic valve function.  His other problems include hypertension and hyperlipidemia. He has monitored his blood pressures at home and they have been quite good.  Current Medications (verified): 1)  Persantine 50 Mg  Tabs (Dipyridamole) .... Take 1 By Mouth Three Times A Day Qd 2)  Enalapril Maleate 20 Mg  Tabs (Enalapril Maleate) .[name].. 1 Two Times A Day 3)  Allopurinol 300 Mg  Tabs (Allopurinol) .... Take 1 By Mouth Qd 4)  Nexium 40 Mg  Cpdr (Esomeprazole Magnesium) .... Take 1 By Mouth Once Daily Prn 5)  Lipitor 20 Mg  Tabs (Atorvastatin Calcium) .... Take 1 By Mouth Qd 6)  Norvasc 10 Mg  Tabs (Amlodipine Besylate) .... Take 1 By Mouth Qd 7)  Adult Aspirin Low Strength 81 Mg  Tbdp (Aspirin) .[name].. 1 By Mouth Once Daily 8)  Furosemide 40 Mg Tabs (Furosemide) .... Take 1 Tablet By Mouth Once A Day 9)  Warfarin Sodium 5 Mg Tabs (Warfarin Sodium) .... Use As Directed By Anticoagulation Clinic 10)  Magox 400 Otc .... Take 1 Tab Three Times A Day  Allergies (verified): 1)  ! Sulfa  Past History:  Past Medical History: Reviewed history from 11/07/2008 and no changes required. Hyperlipidemia Hypertension Atrial fibrillation-last Echo 11/22/06- EF 50-55%, mild dilation LV, mild MR w/ gradient Coronary artery disease- last Nuclear Stress 12/25/01 with no ischemia Diabetes mellitus, type II-lifestyle management Peripheral  edema-venous insufficiency Elevated TSH, hx of Transient Ischemic attacks, hx of while on coumadin Ventricular ectopy, hx of- symptomatic 1. 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 2. Chronic atrial fibrillation, on Coumadin therapy. 3. History of recurrent transient ischemic attacks, now on Coumadin,     aspirin, and Persantine. 4. Hypertension. 5. Hyperlipidemia. 6. Positive family history of coronary heart disease.  Physician Roster:      Card - B. Brodie      GS-Streck  Review of Systems       ROS is negative except as outlined in HPI.   Vital Signs:  Patient profile:   77 year old male Height:      69 inches Weight:      191 pounds BMI:     28.31 Pulse rate:   106 / minute BP sitting:   132 / 72  (left arm)  Vitals Entered By: Burnett Kanaris, CNA (October 25, 2009 9:47 AM)  Physical Exam  Additional Exam:  Gen. Well-nourished, in no distress   Neck: No JVD, thyroid not enlarged, no carotid bruits Lungs: No tachypnea, clear without rales, rhonchi or wheezes Cardiovascular: Rhythm irregular, PMI not displaced, normal mitral prosthetic closure soundl, no murmurs or gallops, no peripheral edema, pulses normal in all 4 extremities. Abdomen: BS normal, abdomen soft and non-tender without masses or organomegaly, no hepatosplenomegaly. MS: No deformities, no cyanosis or clubbing  Neuro:  No focal sns   Skin:  no lesions    Impression & Recommendations:  Problem # 1:  MITRAL VALVE REPLACEMENT, HX OF (ICD-V15.1) He has had previous mitral valve replacement on chronic Coumadin therapy. He's had no symptoms related to this. This problem appears stable.  Problem # 2:  ATRIAL FIBRILLATION (ICD-427.31)  He has chronic atrial fibrillation managed with rate control and Coumadin. His rate is a little fast today but has had no symptoms of palpitations. We will continue current therapy. His updated medication list for this  problem includes:    Persantine 50 Mg Tabs (Dipyridamole) .[name]... Take 1 by mouth three times a day qd    Adult Aspirin Low Strength 81 Mg Tbdp (Aspirin) .[name]... 1 by mouth once daily    Warfarin Sodium 5 Mg Tabs (Warfarin sodium) ..... Use as directed by anticoagulation clinic  Orders: EKG w/ Interpretation (93000)  His updated medication list for this problem includes:    Persantine 50 Mg Tabs (Dipyridamole) .[name]... Take 1 by mouth three times a day qd    Adult Aspirin Low Strength 81 Mg Tbdp (Aspirin) .[name]... 1 by mouth once daily    Warfarin Sodium 5 Mg Tabs (Warfarin sodium) ..... Use as directed by anticoagulation clinic  Problem # 3:  HYPERTENSION (ICD-401.9)  His blood pressure is well controlled on current medications. His updated medication list for this problem includes:    Enalapril Maleate 20 Mg Tabs (Enalapril maleate) .[name]... 1 two times a day    Norvasc 10 Mg Tabs (Amlodipine besylate) .[name]... Take 1 by mouth qd    Adult Aspirin Low Strength 81 Mg Tbdp (Aspirin) .[name]... 1 by mouth once daily    Furosemide 40 Mg Tabs (Furosemide) .[name]... Take 1 tablet by mouth once a day  His updated medication list for this problem includes:    Enalapril Maleate 20 Mg Tabs (Enalapril maleate) .[name]... 1 two times a day    Norvasc 10 Mg Tabs (Amlodipine besylate) .[name]... Take 1 by mouth qd    Adult Aspirin Low Strength 81 Mg Tbdp (Aspirin) .[name]... 1 by mouth once daily    Furosemide 40 Mg Tabs (Furosemide) .[name]... Take 1 tablet by mouth once a day  Patient Instructions: 1)  Your physician recommends that you continue on your current medications as directed. Please refer to the Current Medication list given to you today. 2)  Your physician wants you to follow-up in:  1 year with Dr. Shirlee Latch. You will receive a reminder letter in the mail two months in advance. If you don't receive a letter, please call our office to schedule the follow-up appointment. Prescriptions: WARFARIN SODIUM 5 MG TABS (WARFARIN SODIUM) Use  as directed by Anticoagulation Clinic  #270 x 3   Entered by:   Sherri Rad, RN, BSN   Authorized by:   Lenoria Farrier, MD, [facility]   Signed by:   Sherri Rad, RN, BSN on 10/25/2009   Method used:   Faxed to ...       [facility] (mail-order)             , [place]         Ph: [phone]       Fax: [phone]   RxID:   [id number] PERSANTINE 50 MG  TABS (DIPYRIDAMOLE) take 1 by mouth three times a day qd  #270 x 3   Entered by:   Sherri Rad, RN, BSN   Authorized by:   Lenoria Farrier, MD, [facility]  Signed by:   Sherri Rad, RN, BSN on 10/25/2009   Method used:   Faxed to ...       [facility] (mail-order)             , [place]         Ph: [phone]       Fax: [phone]   RxID:   [id number]

## 2010-04-25 NOTE — Medication Information (Signed)
Summary: rov-tp  Anticoagulant Therapy  Managed by: Bethena Midget, RN, BSN Referring MD: Charlies Constable MD PCP: Link Snuffer MD: Myrtis Ser MD, Tinnie Gens Indication 1: Mitral Valve Replacement (ICD-V43.3) Indication 2: TIA (ICD-435.9) Lab Used: [facility] Site: [facility] INR POC 2.6 INR RANGE 3-3.5  Dietary changes: no    Health status changes: no    Bleeding/hemorrhagic complications: no    Recent/future hospitalizations: no    Any changes in medication regimen? no    Recent/future dental: no  Any missed doses?: yes     Details: missed yesterdays dose  Is patient compliant with meds? yes       Allergies: 1)  ! Sulfa  Anticoagulation Management History:      The patient is taking warfarin and comes in today for a routine follow up visit.  Positive risk factors for bleeding include an age of 75 years or older, history of CVA/TIA, and presence of serious comorbidities.  The bleeding index is 'high risk'.  Positive CHADS2 values include History of HTN, History of Diabetes, and Prior Stroke/CVA/TIA.  Negative CHADS2 values include Age > 75 years old.  The start date was 06/18/1997.  Anticoagulation responsible provider: Myrtis Ser MD, Tinnie Gens.  INR POC: 2.6.  Cuvette Lot#: [id number].  Exp: 10/25/2010.    Anticoagulation Management Assessment/Plan:      The patient's current anticoagulation dose is Warfarin sodium 5 mg tabs: Use as directed by Anticoagulation Clinic.  The target INR is 3-3.5.  The next INR is due 10/06/2009.  Anticoagulation instructions were given to patient.  Results were reviewed/authorized by Bethena Midget, RN, BSN.  He was notified by Bethena Midget, RN, BSN.         Prior Anticoagulation Instructions: Continue same dosage: 7.5mg  daily except 5mg  MWF  Current Anticoagulation Instructions: INR 2.6 Today take 10mg s , on Friday take 7.5mg s then resume 7.5mg s daily except 5mg s on Mondays, Wednesdays and  Fridays. Recheck in 4 weeks.

## 2010-04-25 NOTE — Medication Information (Signed)
Summary: rov/tm  Anticoagulant Therapy  Managed by: [name], RN, BSN Referring MD: Charlies Constable MD PCP: Link Snuffer MD: Eden Emms MD, Theron Arista Indication 1: Mitral Valve Replacement (ICD-V43.3) Indication 2: TIA (ICD-435.9) Lab Used: [facility] Site: [facility] INR POC 3.2 INR RANGE 3-3.5  Dietary changes: no    Health status changes: no    Bleeding/hemorrhagic complications: no    Recent/future hospitalizations: no    Any changes in medication regimen? no    Recent/future dental: no  Any missed doses?: no       Is patient compliant with meds? yes       Allergies (verified): 1)  ! Sulfa  Anticoagulation Management History:      The patient is taking warfarin and comes in today for a routine follow up visit.  Positive risk factors for bleeding include an age of 75 years or older, history of CVA/TIA, and presence of serious comorbidities.  The bleeding index is 'high risk'.  Positive CHADS2 values include History of HTN, History of Diabetes, and Prior Stroke/CVA/TIA.  Negative CHADS2 values include Age > 31 years old.  The start date was 06/18/1997.  Anticoagulation responsible provider: Eden Emms MD, Theron Arista.  INR POC: 3.2.  Cuvette Lot#: [id number].  Exp: 07/2010.    Anticoagulation Management Assessment/Plan:      The patient's current anticoagulation dose is Warfarin sodium 5 mg tabs: Use as directed by Anticoagulation Clinic.  The target INR is 3-3.5.  The next INR is due 07/28/2009.  Anticoagulation instructions were given to patient.  Results were reviewed/authorized by [name], RN, BSN.  He was notified by [name] RN.         Prior Anticoagulation Instructions: INR 3.3 Continue 7.5mg s daily except 5mg s on Mondays, Wednesdays and Fridays. Recheck in 4 weeks.   Current Anticoagulation Instructions: INR 3.2  Continue on same dosage 7.5mg  daily except 5mg  on Mondays, Wednesdays, and Fridays.  Recheck in 4 weeks.

## 2010-04-25 NOTE — Medication Information (Signed)
Summary: rov/ewj  Anticoagulant Therapy  Managed by: Bethena Midget, RN, BSN Referring MD: Charlies Constable MD PCP: Link Snuffer MD: Ladona Ridgel MD, Sharlot Gowda Indication 1: Mitral Valve Replacement (ICD-V43.3) Indication 2: TIA (ICD-435.9) Lab Used: [facility] Site: [facility] INR POC 3.3 INR RANGE 3-3.5  Dietary changes: no    Health status changes: no    Bleeding/hemorrhagic complications: no    Recent/future hospitalizations: no     Recent/future dental: no  Any missed doses?: no       Is patient compliant with meds? yes       Current Medications (verified): 1)  Persantine 50 Mg  Tabs (Dipyridamole) .... Take 1 By Mouth Three Times A Day Qd 2)  Enalapril Maleate 20 Mg  Tabs (Enalapril Maleate) .[name].. 1 Two Times A Day 3)  Allopurinol 300 Mg  Tabs (Allopurinol) .... Take 1 By Mouth Qd 4)  Nexium 40 Mg  Cpdr (Esomeprazole Magnesium) .... Take 1 By Mouth Once Daily Prn 5)  Lipitor 20 Mg  Tabs (Atorvastatin Calcium) .... Take 1 By Mouth Qd 6)  Norvasc 10 Mg  Tabs (Amlodipine Besylate) .... Take 1 By Mouth Qd 7)  Adult Aspirin Low Strength 81 Mg  Tbdp (Aspirin) .[name].. 1 By Mouth Once Daily 8)  Furosemide 40 Mg Tabs (Furosemide) .... Take 1 Tablet By Mouth Once A Day 9)  Warfarin Sodium 5 Mg Tabs (Warfarin Sodium) .... Use As Directed By Anticoagulation Clinic  Allergies (verified): 1)  ! Sulfa  Anticoagulation Management History:      The patient is taking warfarin and comes in today for a routine follow up visit.  Positive risk factors for bleeding include an age of 75 years or older, history of CVA/TIA, and presence of serious comorbidities.  The bleeding index is 'high risk'.  Positive CHADS2 values include History of HTN, History of Diabetes, and Prior Stroke/CVA/TIA.  Negative CHADS2 values include Age > 86 years old.  The start date was 06/18/1997.  Anticoagulation responsible provider: Ladona Ridgel MD, Sharlot Gowda.  INR POC: 3.3.  Cuvette Lot#: [id number].  Exp: 07/24/2010.     Anticoagulation Management Assessment/Plan:      The patient's current anticoagulation dose is Warfarin sodium 5 mg tabs: Use as directed by Anticoagulation Clinic.  The target INR is 3-3.5.  The next INR is due 05/05/2009.  Anticoagulation instructions were given to patient.  Results were reviewed/authorized by Bethena Midget, RN, BSN.  He was notified by Bethena Midget, RN, BSN.         Prior Anticoagulation Instructions: INR 2.4  Take an extra 1/2 tab today then start taking 1.5 tablets daily except 1 tablet on Mondays, Wednesdays, and Fridays.  Recheck in 3 weeks.         Current Anticoagulation Instructions: INR 3.3 Continue 7.5mg  daily except 5mg s on Mondays, Wednesdays and Fridays.   Appended Document: rov/ewj Pt. complained of having some double vision and some incohert episode today will while backing out of his driveway. Circled [place] sac and parked back in driveway, called wife on cell phone she came out to sit with him. It last only about 2 minutes. He states that after he might have had a mild headache but now it's resolved. He states that he is now having a "funny" feeling in his chest. Denies at present HA, dizziness, or  visual changes. Triage nurse(Anne Lankford) called and she came over and retrived pt.

## 2010-04-25 NOTE — Medication Information (Signed)
Summary: rov coumadin - [facility]  Anticoagulant Therapy  Managed by: Charolotte Eke, PharmD Referring MD: Charlies Constable MD PCP: Link Snuffer MD: Daleen Squibb MD, Maisie Fus Indication 1: Mitral Valve Replacement (ICD-V43.3) Indication 2: TIA (ICD-435.9) Lab Used: [facility]  Site: [facility] INR POC 3.1 INR RANGE 3-3.5  Dietary changes: no    Health status changes: no    Bleeding/hemorrhagic complications: no    Recent/future hospitalizations: no    Any changes in medication regimen? no    Recent/future dental: no  Any missed doses?: no       Is patient compliant with meds? yes       Current Medications (verified): 1)  Persantine 50 Mg  Tabs (Dipyridamole) .... Take 1 By Mouth Three Times A Day Qd 2)  Enalapril Maleate 20 Mg  Tabs (Enalapril Maleate) .[name].. 1 Two Times A Day 3)  Allopurinol 300 Mg  Tabs (Allopurinol) .... Take 1 By Mouth Qd 4)  Nexium 40 Mg  Cpdr (Esomeprazole Magnesium) .... Take 1 By Mouth Once Daily Prn 5)  Lipitor 20 Mg  Tabs (Atorvastatin Calcium) .... Take 1 By Mouth Qd 6)  Norvasc 10 Mg  Tabs (Amlodipine Besylate) .... Take 1 By Mouth Qd 7)  Adult Aspirin Low Strength 81 Mg  Tbdp (Aspirin) .[name].. 1 By Mouth Once Daily 8)  Furosemide 40 Mg Tabs (Furosemide) .... Take 1 Tablet By Mouth Once A Day 9)  Warfarin Sodium 5 Mg Tabs (Warfarin Sodium) .... Use As Directed By Anticoagulation Clinic  Allergies (verified): 1)  ! Sulfa  Anticoagulation Management History:      The patient is taking warfarin and comes in today for a routine follow up visit.  Positive risk factors for bleeding include an age of 58 years or older, history of CVA/TIA, and presence of serious comorbidities.  The bleeding index is 'high risk'.  Positive CHADS2 values include History of HTN, History of Diabetes, and Prior Stroke/CVA/TIA.  Negative CHADS2 values include Age > 61 years old.  The start date was 06/18/1997.  Anticoagulation responsible provider: Daleen Squibb MD, Maisie Fus.  INR POC: 3.1.  Cuvette  Lot#: [id number].  Exp: 07/24/2010.    Anticoagulation Management Assessment/Plan:      The patient's current anticoagulation dose is Warfarin sodium 5 mg tabs: Use as directed by Anticoagulation Clinic.  The target INR is 3-3.5.  The next INR is due 06/02/2009.  Anticoagulation instructions were given to patient.  Results were reviewed/authorized by Charolotte Eke, PharmD.  He was notified by Charolotte Eke, PharmD.         Prior Anticoagulation Instructions: INR 3.3 Continue 7.5mg  daily except 5mg s on Mondays, Wednesdays and Fridays.   Current Anticoagulation Instructions: The patient is to continue with the same dose of coumadin.  This dosage includes: 7.5mg  daily except 5mg  on MWF.

## 2010-04-25 NOTE — Assessment & Plan Note (Signed)
Summary: CANNOT HEAR OUT OF LEFT EAR/NO EAR PAIN/CD   Vital Signs:  Patient profile:   77 year old male Height:      69 inches (175.26 cm) Weight:      194.75 pounds (88.52 kg) BMI:     28.86 O2 Sat:      98 % on Room air Temp:     98.2 degrees F (36.78 degrees C) oral Pulse rate:   73 / minute Pulse rhythm:   regular BP sitting:   128 / 74  (left arm) Cuff size:   large  Vitals Entered By: Brenton Grills (July 07, 2009 1:53 PM)  O2 Flow:  Room air CC: pt c/o not being able to hear out of left ear since 4/4./pt states he heard loud noise on 4/3 couldn't hear afterward/pt states he has no pain/ringing in left ear and a "roaring"  sound/pt states he has seen an ear dr. previously/aj   Primary Care Provider:  Norins  CC:  pt c/o not being able to hear out of left ear since 4/4./pt states he heard loud noise on 4/3 couldn't hear afterward/pt states he has no pain/ringing in left ear and a "roaring"  sound/pt states he has seen an ear dr. previously/aj.  History of Present Illness: Loss of hearing left. Sudden onset 12 days ago. Had a loud squealing in the hear than loss of hearing. He had a similar episode on the right. He did see Dr. Lazarus Salines last Tuesday. The Dr. was recommending neurology consultation. He did have hearing test that day which revealed significant hearing loss. Prednisone treatment was offered but declined. Evidently no mechanical or anatomical problem was identified.  Current Medications (verified): 1)  Persantine 50 Mg  Tabs (Dipyridamole) .... Take 1 By Mouth Three Times A Day Qd 2)  Enalapril Maleate 20 Mg  Tabs (Enalapril Maleate) .[name].. 1 Two Times A Day 3)  Allopurinol 300 Mg  Tabs (Allopurinol) .... Take 1 By Mouth Qd 4)  Nexium 40 Mg  Cpdr (Esomeprazole Magnesium) .... Take 1 By Mouth Once Daily Prn 5)  Lipitor 20 Mg  Tabs (Atorvastatin Calcium) .... Take 1 By Mouth Qd 6)  Norvasc 10 Mg  Tabs (Amlodipine Besylate) .... Take 1 By Mouth Qd 7)  Adult Aspirin Low  Strength 81 Mg  Tbdp (Aspirin) .[name].. 1 By Mouth Once Daily 8)  Furosemide 40 Mg Tabs (Furosemide) .... Take 1 Tablet By Mouth Once A Day 9)  Warfarin Sodium 5 Mg Tabs (Warfarin Sodium) .... Use As Directed By Anticoagulation Clinic 10)  Magox 400 Otc .... Take 1 Tab Three Times A Day  Allergies (verified): 1)  ! Sulfa PMH-FH-SH reviewed-no changes except otherwise noted  Review of Systems       The patient complains of decreased hearing.  The patient denies anorexia, fever, weight loss, chest pain, dyspnea on exertion, abdominal pain, muscle weakness, and enlarged lymph nodes.    Physical Exam  General:  WNWD white male with an audible mechanical valve click Head:  normocephalic and atraumatic.   Lungs:  normal respiratory effort and normal breath sounds.   Heart:  normal rate and regular rhythm.   Neurologic:  alert & oriented X3 and gait normal.   Skin:  turgor normal and color normal.   Psych:  Oriented X3, normally interactive, and good eye contact.     Impression & Recommendations:  Problem # 1:  HEARING LOSS, LEFT EAR (ICD-389.9) Sudden hearing loss left with ENT evaluation being unrevealing. We  discussed the possibllity of a CNS cause or possibly an acoustic neuroma. He is on full anticoagulation and feels that he doesn't want to pursue neuro-imaging or neuro consult since if this is a CNs event there is not much additional treatment that could be offered. He doesn't to wish to pursue a r/o acoustic neuroma since he would not consider surgery.  Plan - after time has past and his hearing stabilizes he should have repeat audiogram to see if he will be a candidate for amplification devices.  Complete Medication List: 1)  Persantine 50 Mg Tabs (Dipyridamole) .... Take 1 by mouth three times a day qd 2)  Enalapril Maleate 20 Mg Tabs (Enalapril maleate) .[name].. 1 two times a day 3)  Allopurinol 300 Mg Tabs (Allopurinol) .... Take 1 by mouth qd 4)  Nexium 40 Mg Cpdr (Esomeprazole  magnesium) .... Take 1 by mouth once daily prn 5)  Lipitor 20 Mg Tabs (Atorvastatin calcium) .... Take 1 by mouth qd 6)  Norvasc 10 Mg Tabs (Amlodipine besylate) .... Take 1 by mouth qd 7)  Adult Aspirin Low Strength 81 Mg Tbdp (Aspirin) .[name].. 1 by mouth once daily 8)  Furosemide 40 Mg Tabs (Furosemide) .... Take 1 tablet by mouth once a day 9)  Warfarin Sodium 5 Mg Tabs (Warfarin sodium) .... Use as directed by anticoagulation clinic 10)  Magox 400 Otc  .... Take 1 tab three times a day

## 2010-04-25 NOTE — Medication Information (Signed)
Summary: rov/ewj  Anticoagulant Therapy  Managed by: Eda Keys, PharmD Referring MD: Charlies Constable MD PCP: Link Snuffer MD: Shirlee Latch MD, Malike Foglio Indication 1: Mitral Valve Replacement (ICD-V43.3) Indication 2: TIA (ICD-435.9) Lab Used: [facility] Site: [facility] INR POC 5.5 INR RANGE 3-3.5  Dietary changes: yes       Details: couple Artist  Health status changes: no    Bleeding/hemorrhagic complications: no    Recent/future hospitalizations: no    Any changes in medication regimen? yes       Details: Pt changed to generic warfarin  Recent/future dental: no  Any missed doses?: no       Is patient compliant with meds? yes       Allergies: 1)  ! Sulfa  Anticoagulation Management History:      The patient is taking warfarin and comes in today for a routine follow up visit.  Positive risk factors for bleeding include an age of 20 years or older, history of CVA/TIA, and presence of serious comorbidities.  The bleeding index is 'high risk'.  Positive CHADS2 values include History of HTN, History of Diabetes, and Prior Stroke/CVA/TIA.  Negative CHADS2 values include Age > 10 years old.  The start date was 06/18/1997.  Anticoagulation responsible provider: Shirlee Latch MD, Raul Winterhalter.  INR POC: 5.5.  Cuvette Lot#: [id number].  Exp: 08/2010.    Anticoagulation Management Assessment/Plan:      The patient's current anticoagulation dose is Warfarin sodium 5 mg tabs: Use as directed by Anticoagulation Clinic.  The target INR is 3-3.5.  The next INR is due 08/11/2009.  Anticoagulation instructions were given to patient.  Results were reviewed/authorized by Eda Keys, PharmD.  He was notified by Eda Keys.         Prior Anticoagulation Instructions: INR 3.2  Continue on same dosage 7.5mg  daily except 5mg  on Mondays, Wednesdays, and Fridays.  Recheck in 4 weeks.    Current Anticoagulation Instructions: INR 5.5  Do NOT take coumadin Friday or Saturday.  Then return to  normal dosing schedule of 1 tablet on Monday, Wednesday, and Friday, and 1.5 tablets all other days.  Return to clinic in 2 weeks.

## 2010-04-25 NOTE — Progress Notes (Signed)
Summary: INR 3.3--double vision  Phone Note Outgoing Call   Call placed by: Katina Dung, RN, BSN,  April 07, 2009 5:12 PM Call placed to: Patient Summary of Call: I was asked by Tiffany in [facility] to talk with pt  Follow-up for Phone Call        pt to [facility] today for INR--INR today was 3.3 --when pt was in [facility] he c/o double vision earlier today--I was asked by Tiffany in [facility]  to talk with patient--I talked with pt--pt states he had been home for lunch and was going back to work--he was in his car trying to fasten his seatbelt and looked up and had double vision that lasted 1-2 minutes--pt denies any other symptoms except feeling jittery in his chest after this happened--I reviewed this with Dr Ladona Ridgel while pt was here--no new recommendations by Dr Ladona Ridgel today except for pt to follow-up with Dr Coralee North will forward to Dr Ladona Ridgel for signature and Dr Juanda Chance for review

## 2010-04-25 NOTE — Assessment & Plan Note (Signed)
Summary: dizzy / SD   Vital Signs:  Patient profile:   77 year old male Weight:      193 pounds Temp:     98.0 degrees F oral Pulse rate:   101 / minute Pulse rhythm:   irregular BP sitting:   132 / 62  (left arm)  Vitals Entered By: Lamar Sprinkles, CMA (Aug 04, 2009 9:23 AM) CC: Dizzy since 8 am, recent ear infection. Sinus complaints this week.   Primary Care Provider:  Heavan Francom  CC:  Dizzy since 8 am and recent ear infection. Sinus complaints this week.[name]  History of Present Illness: Patient is seen acutely for the on-set this AM of marked dizziness, such that it limited his ability to walk. This was of sudden on-set at work. There were no associated symptoms: no facial assymetry, slurred speech, focal weakness or cognitive impairment. Of note he did recently have ENT evaluation for hearing loss left ear which is now better. He also c/o persistent left frontal HA. His symptoms of dizziness are better at the time of exam.   Current Medications (verified): 1)  Persantine 50 Mg  Tabs (Dipyridamole) .... Take 1 By Mouth Three Times A Day Qd 2)  Enalapril Maleate 20 Mg  Tabs (Enalapril Maleate) .[name].. 1 Two Times A Day 3)  Allopurinol 300 Mg  Tabs (Allopurinol) .... Take 1 By Mouth Qd 4)  Nexium 40 Mg  Cpdr (Esomeprazole Magnesium) .... Take 1 By Mouth Once Daily Prn 5)  Lipitor 20 Mg  Tabs (Atorvastatin Calcium) .... Take 1 By Mouth Qd 6)  Norvasc 10 Mg  Tabs (Amlodipine Besylate) .... Take 1 By Mouth Qd 7)  Adult Aspirin Low Strength 81 Mg  Tbdp (Aspirin) .[name].. 1 By Mouth Once Daily 8)  Furosemide 40 Mg Tabs (Furosemide) .... Take 1 Tablet By Mouth Once A Day 9)  Warfarin Sodium 5 Mg Tabs (Warfarin Sodium) .... Use As Directed By Anticoagulation Clinic 10)  Magox 400 Otc .... Take 1 Tab Three Times A Day 11)  Nasacort Aq 55 Mcg/act Aers (Triamcinolone Acetonide(Nasal)) .... As Needed  Allergies (verified): 1)  ! Sulfa  Past History:  Past Medical History: Last updated:  11/07/2008 Hyperlipidemia Hypertension Atrial fibrillation-last Echo 11/22/06- EF 50-55%, mild dilation LV, mild MR w/ gradient Coronary artery disease- last Nuclear Stress 12/25/01 with no ischemia Diabetes mellitus, type II-lifestyle management Peripheral edema-venous insufficiency Elevated TSH, hx of Transient Ischemic attacks, hx of while on coumadin Ventricular ectopy, hx of- symptomatic 1. 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 2. Chronic atrial fibrillation, on Coumadin therapy. 3. History of recurrent transient ischemic attacks, now on Coumadin,     aspirin, and Persantine. 4. Hypertension. 5. Hyperlipidemia. 6. Positive family history of coronary heart disease.  Physician Roster:      Card - B. Juanda Chance      GS-Streck  Past Surgical History: Last updated: 02/07/2007 Mitral valve replacement -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 Cholecystectomy, laparoscopic '03  Family History: Last updated: 2008-11-26 Father - deceased @ 59:  CAD/MI,  Mother - deceased @ 72: Died secondary to blood clot Neg - colon or prostate cancer; positive for CAD; DM  Social History: Last updated: 09/02/2008 HSG, many management courses married '58 2 sons - '61, '62: 1 grandchild Work- Psychologist, educational in Designer, fashion/clothing  Risk Factors: Smoking Status: never (01/31/2007)  Review of Systems       The patient complains of headaches.  The patient denies anorexia, fever, weight loss, chest pain, syncope, dyspnea on exertion, abdominal pain, hematochezia, muscle weakness, suspicious skin lesions, depression, unusual weight change, and angioedema.    Physical Exam  General:  WNWD white male in no acute distress. Head:  normocephalic and atraumatic.   Eyes:  vision grossly intact, pupils equal, pupils round, corneas and lenses clear, no injection, and no retinal abnormalitiies.   Neck:  supple and full ROM.   Lungs:  normal respiratory  effort and normal breath sounds.   Heart:  Prominent mechanical valve click normal rate, regular rhythm, and no JVD.   Msk:  normal ROM and no joint swelling.   Pulses:  2 radial  Neurologic:  alert & oriented X3, cranial nerves II-XII intact, strength normal in all extremities, gait normal, DTRs symmetrical and normal, finger-to-nose normal, heel-to-shin normal, and Romberg negative.  Normal rapid alternating finger movement, no dysdiadochokinesia.  Skin:  turgor normal and color normal.   Cervical Nodes:  no anterior cervical adenopathy and no posterior cervical adenopathy.   Psych:  Oriented X3, memory intact for recent and remote, normally interactive, and good eye contact.     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 His exam, 2+ hours, after on-set of symptoms is non-focal. His situation re: CNS abnormality is of more concern with persistent HA and with transient loss of hearing left ear.  Plan - Urgent MRI to r/o CVA, mass occupying lesion.  Orders: Radiology Referral (Radiology)  Addendum- MRI brain was unremarkable. Patient was informed.   Complete Medication List: 1)  Persantine 50 Mg Tabs (Dipyridamole) .... Take 1 by mouth three times a day qd 2)  Enalapril Maleate 20 Mg Tabs (Enalapril maleate) .[name].. 1 two times a day 3)  Allopurinol 300 Mg Tabs (Allopurinol) .... Take 1 by mouth qd 4)  Nexium 40 Mg Cpdr (Esomeprazole magnesium) .... Take 1 by mouth once daily prn 5)  Lipitor 20 Mg Tabs (Atorvastatin calcium) .... Take 1 by mouth qd 6)  Norvasc 10 Mg Tabs (Amlodipine besylate) .... Take 1 by mouth qd 7)  Adult Aspirin Low Strength 81 Mg Tbdp (Aspirin) .[name].. 1 by mouth once daily 8)  Furosemide 40 Mg Tabs (Furosemide) .... Take 1 tablet by mouth once a day 9)  Warfarin Sodium 5 Mg Tabs (Warfarin sodium) .... Use as directed by anticoagulation clinic 10)  Magox 400 Otc  .... Take 1 tab three  times a day 11)  Nasacort Aq 55 Mcg/act Aers (Triamcinolone acetonide(nasal)) .... As needed

## 2010-04-25 NOTE — Progress Notes (Signed)
Summary: changed target INR per Dr Juanda Chance  ---- Converted from flag ---- ---- 03/20/2009 2:31 PM, Lenoria Farrier, MD, [facility] wrote: I think it would be ok to decrease inr goal to 2.5 -3.5. BB  ---- 03/16/2009 3:11 PM, [name] RN wrote: Pt states he is no longer having TIA symptoms since start of 81mg  ASA daily approx 2 yrs ago.  Pt was wanting to know if decreasing INR range to 2.5-3.5 would be appropriate since he no longer is having these symptoms.   Please advise.  Thanks. ------------------------------

## 2010-04-25 NOTE — Medication Information (Signed)
Summary: rov-tp  Anticoagulant Therapy  Managed by: Bethena Midget, RN, BSN Referring MD: Charlies Constable MD PCP: Link Snuffer MD: Daleen Squibb MD, Maisie Fus Indication 1: Mitral Valve Replacement (ICD-V43.3) Indication 2: TIA (ICD-435.9) Lab Used: [facility] Site: [facility] INR POC 3.3 INR RANGE 3-3.5  Dietary changes: no    Health status changes: no    Bleeding/hemorrhagic complications: no    Recent/future hospitalizations: no    Any changes in medication regimen? no    Recent/future dental: no  Any missed doses?: no       Is patient compliant with meds? yes       Allergies: 1)  ! Sulfa  Anticoagulation Management History:      The patient is taking warfarin and comes in today for a routine follow up visit.  Positive risk factors for bleeding include an age of 75 years or older, history of CVA/TIA, and presence of serious comorbidities.  The bleeding index is 'high risk'.  Positive CHADS2 values include History of HTN, History of Diabetes, and Prior Stroke/CVA/TIA.  Negative CHADS2 values include Age > 39 years old.  The start date was 06/18/1997.  Anticoagulation responsible provider: Daleen Squibb MD, Maisie Fus.  INR POC: 3.3.  Cuvette Lot#: [id number].  Exp: 07/2010.    Anticoagulation Management Assessment/Plan:      The patient's current anticoagulation dose is Warfarin sodium 5 mg tabs: Use as directed by Anticoagulation Clinic.  The target INR is 3-3.5.  The next INR is due 06/30/2009.  Anticoagulation instructions were given to patient.  Results were reviewed/authorized by Bethena Midget, RN, BSN.  He was notified by Bethena Midget, RN, BSN.         Prior Anticoagulation Instructions: The patient is to continue with the same dose of coumadin.  This dosage includes: 7.5mg  daily except 5mg  on MWF.  Current Anticoagulation Instructions: INR 3.3 Continue 7.5mg s daily except 5mg s on Mondays, Wednesdays and Fridays. Recheck in 4 weeks.

## 2010-04-25 NOTE — Progress Notes (Signed)
Summary: REFILL - [facility]  Phone Note Call from Patient   Summary of Call: Patient is requesting refill of Furosemide 40mg  from [facility]. Per pt [facility] told pt that he could not get refills w/o office visit. OK FOR REFILL?  Initial call taken by: Lamar Sprinkles, CMA,  Aug 08, 2009 10:46 AM  Follow-up for Phone Call        OK for as needed refills. He has had several office visits Follow-up by: Jacques Navy MD,  Aug 08, 2009 1:25 PM    Prescriptions: FUROSEMIDE 40 MG TABS (FUROSEMIDE) Take 1 tablet by mouth once a day  #90 x 3   Entered by:   Ami Bullins CMA   Authorized by:   Jacques Navy MD   Signed by:   Bill Salinas CMA on 08/08/2009   Method used:   Electronically to        [facility]* (mail-order)             ,          Ph: [phone]       Fax: [phone]   RxID:   [id number]

## 2010-04-25 NOTE — Letter (Signed)
Summary: Screening/BGF  Screening/BGF   Imported By: Lester [place] 08/25/2009 10:23:26  _____________________________________________________________________  External Attachment:    Type:   Image     Comment:   External Document

## 2010-04-25 NOTE — Medication Information (Signed)
Summary: rov/ewj  Anticoagulant Therapy  Managed by: Weston Brass, PharmD Referring MD: Charlies Constable MD PCP: Link Snuffer MD: Tenny Craw MD, Gunnar Fusi Indication 1: Mitral Valve Replacement (ICD-V43.3) Indication 2: TIA (ICD-435.9) Lab Used: [facility]  Site: [facility] INR POC 3.2 INR RANGE 3-3.5  Dietary changes: no    Health status changes: no    Bleeding/hemorrhagic complications: no    Recent/future hospitalizations: no    Any changes in medication regimen? no    Recent/future dental: no  Any missed doses?: no       Is patient compliant with meds? yes       Allergies: 1)  ! Sulfa  Anticoagulation Management History:      The patient is taking warfarin and comes in today for a routine follow up visit.  Positive risk factors for bleeding include an age of 75 years or older, history of CVA/TIA, and presence of serious comorbidities.  The bleeding index is 'high risk'.  Positive CHADS2 values include History of HTN, History of Diabetes, and Prior Stroke/CVA/TIA.  Negative CHADS2 values include Age > 25 years old.  The start date was 06/18/1997.  Anticoagulation responsible provider: Tenny Craw MD, Gunnar Fusi.  INR POC: 3.2.  Cuvette Lot#: [id number].  Exp: 11/2010.    Anticoagulation Management Assessment/Plan:      The patient's current anticoagulation dose is Warfarin sodium 5 mg tabs: Use as directed by Anticoagulation Clinic.  The target INR is 3-3.5.  The next INR is due 12/02/2009.  Anticoagulation instructions were given to patient.  Results were reviewed/authorized by Weston Brass, PharmD.  He was notified by Liana Gerold, PharmD Candidate.         Prior Anticoagulation Instructions: INR 3.2  Continue on same dosage 7.5mg  daily except 5mg  on MOndays, Wednesdays, and Fridays.  Recheck in 4 weeks.    Current Anticoagulation Instructions: INR 3.2  Continue with 1.5 tablets daily except 1 tablet Mon, Wed, and Fri.  Return to clinic in 4 weeks.

## 2010-04-25 NOTE — Medication Information (Signed)
Summary: rov/eac  Anticoagulant Therapy  Managed by: Charolotte Eke, PharmD Referring MD: Charlies Constable MD PCP: Link Snuffer MD: Ladona Ridgel MD, Sharlot Gowda Indication 1: Mitral Valve Replacement (ICD-V43.3) Indication 2: TIA (ICD-435.9) Lab Used: [facility] Site: [facility] INR POC 3.0 INR RANGE 3-3.5  Dietary changes: no    Health status changes: no    Bleeding/hemorrhagic complications: no    Recent/future hospitalizations: no    Any changes in medication regimen? no    Recent/future dental: no  Any missed doses?: no       Is patient compliant with meds? yes       Current Medications (verified): 1)  Persantine 50 Mg  Tabs (Dipyridamole) .... Take 1 By Mouth Three Times A Day Qd 2)  Enalapril Maleate 20 Mg  Tabs (Enalapril Maleate) .[name].. 1 Two Times A Day 3)  Allopurinol 300 Mg  Tabs (Allopurinol) .... Take 1 By Mouth Qd 4)  Nexium 40 Mg  Cpdr (Esomeprazole Magnesium) .... Take 1 By Mouth Once Daily Prn 5)  Lipitor 20 Mg  Tabs (Atorvastatin Calcium) .... Take 1 By Mouth Qd 6)  Norvasc 10 Mg  Tabs (Amlodipine Besylate) .... Take 1 By Mouth Qd 7)  Adult Aspirin Low Strength 81 Mg  Tbdp (Aspirin) .[name].. 1 By Mouth Once Daily 8)  Furosemide 40 Mg Tabs (Furosemide) .... Take 1 Tablet By Mouth Once A Day 9)  Warfarin Sodium 5 Mg Tabs (Warfarin Sodium) .... Use As Directed By Anticoagulation Clinic 10)  Magox 400 Otc .... Take 1 Tab Three Times A Day 11)  Nasacort Aq 55 Mcg/act Aers (Triamcinolone Acetonide(Nasal)) .... As Needed  Allergies (verified): 1)  ! Sulfa  Anticoagulation Management History:      The patient is taking warfarin and comes in today for a routine follow up visit.  Positive risk factors for bleeding include an age of 75 years or older, history of CVA/TIA, and presence of serious comorbidities.  The bleeding index is 'high risk'.  Positive CHADS2 values include History of HTN, History of Diabetes, and Prior Stroke/CVA/TIA.  Negative CHADS2 values include Age >  48 years old.  The start date was 06/18/1997.  Anticoagulation responsible provider: Ladona Ridgel MD, Sharlot Gowda.  INR POC: 3.0.  Cuvette Lot#: [id number].  Exp: 10/25/2010.    Anticoagulation Management Assessment/Plan:      The patient's current anticoagulation dose is Warfarin sodium 5 mg tabs: Use as directed by Anticoagulation Clinic.  The target INR is 3-3.5.  The next INR is due 09/08/2009.  Anticoagulation instructions were given to patient.  Results were reviewed/authorized by Charolotte Eke, PharmD.  He was notified by Charolotte Eke, PharmD.         Prior Anticoagulation Instructions: INR 5.5  Do NOT take coumadin Friday or Saturday.  Then return to normal dosing schedule of 1 tablet on Monday, Wednesday, and Friday, and 1.5 tablets all other days.  Return to clinic in 2 weeks.     Current Anticoagulation Instructions: Continue same dosage: 7.5mg  daily except 5mg  MWF

## 2010-04-25 NOTE — Medication Information (Signed)
Summary: rov/ewj  Anticoagulant Therapy  Managed by: Weston Brass, PharmD Referring MD: Charlies Constable MD PCP: Link Snuffer MD: Myrtis Ser MD, Tinnie Gens Indication 1: Mitral Valve Replacement (ICD-V43.3) Indication 2: TIA (ICD-435.9) Lab Used: [facility] Site: [facility] INR POC 3.1 INR RANGE 3-3.5  Dietary changes: no    Health status changes: no    Bleeding/hemorrhagic complications: no    Recent/future hospitalizations: no    Any changes in medication regimen? no    Recent/future dental: no  Any missed doses?: no       Is patient compliant with meds? yes       Allergies: 1)  ! Sulfa  Anticoagulation Management History:      The patient is taking warfarin and comes in today for a routine follow up visit.  Positive risk factors for bleeding include an age of 75 years or older, history of CVA/TIA, and presence of serious comorbidities.  The bleeding index is 'high risk'.  Positive CHADS2 values include History of HTN, History of Diabetes, and Prior Stroke/CVA/TIA.  Negative CHADS2 values include Age > 52 years old.  The start date was 06/18/1997.  Anticoagulation responsible provider: Myrtis Ser MD, Tinnie Gens.  INR POC: 3.1.  Cuvette Lot#: [id number].  Exp: 02/2011.    Anticoagulation Management Assessment/Plan:      The patient's current anticoagulation dose is Warfarin sodium 5 mg tabs: Use as directed by Anticoagulation Clinic.  The target INR is 3-3.5.  The next INR is due 03/21/2010.  Anticoagulation instructions were given to patient.  Results were reviewed/authorized by Weston Brass, PharmD.  He was notified by Weston Brass PharmD.         Prior Anticoagulation Instructions: INR 3.5  Continue on same dosage 1.5 tablets daily except 1 tablet on Mondays, Wednesdays, and Fridays. Recheck in 4 weeks.   Current Anticoagulation Instructions: INR 3.1  Continue same dose of 1 1/2 tablets every day except 1 tablet on Monday, Wednesday and Friday.  Recheck INR in 4 weeks.

## 2010-04-27 NOTE — Assessment & Plan Note (Signed)
Summary: 2wk f/u    Visit Type:  2 week follow up Primary Provider:  Norins  CC:  no complaints.  History of Present Illness: Primary Cardiologist:  Dr. Charlies Constable (he will be est. with Dr. Shirlee Latch in Dr. Regino Schultze retirement)  Anthony Gould is a 77 yo male with a h/o permanent AFib on chronic coumadin therapy.  He has a h/o recurrent TIAs while on coumadin and has also been placed on Persantine.  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  His last myoview study was 2003.  His last echo was in Aug. 2010 and demonstrated an EF 50-55%; normally functioning mechanical MV prosthesis, mod BAE and PASP 40 mmHg.    He was seen recently by Tereso Newcomer for chest pain and it was unclear whether this was due to reflux or possibly ischemia. Had a Myoview scan which showed no ischemia and showed an ejection fraction of 46%. Since that time his symptoms have improved. He has been back on the treadmill without much in the way of symptoms.  Current Medications (verified): 1)  Persantine 50 Mg  Tabs (Dipyridamole) .... Take 1 By Mouth Three Times A Day Qd 2)  Enalapril Maleate 20 Mg  Tabs (Enalapril Maleate) .[name].. 1 Two Times A Day 3)  Allopurinol 300 Mg  Tabs (Allopurinol) .... Take 1 By Mouth Qd 4)  Nexium 40 Mg  Cpdr (Esomeprazole Magnesium) .... Take 1 By Mouth Once Daily Prn 5)  Lipitor 20 Mg  Tabs (Atorvastatin Calcium) .... Take 1 By Mouth Qd 6)  Norvasc 10 Mg  Tabs (Amlodipine Besylate) .... Take 1 By Mouth Qd 7)  Adult Aspirin Low Strength 81 Mg  Tbdp (Aspirin) .[name].. 1 By Mouth Once Daily 8)  Furosemide 40 Mg Tabs (Furosemide) .... Take 1 Tablet By Mouth Once A Day 9)  Warfarin Sodium 5 Mg Tabs (Warfarin Sodium) .... Use As Directed By Anticoagulation Clinic 10)  Magox 400 Otc .... Take 1 Tab Three Times A Day  Allergies (verified): 1)  ! Sulfa  Past History:  Past Medical History: Reviewed history from 02/28/2010 and no changes  required. Hyperlipidemia Hypertension Atrial fibrillation-last Echo 11/22/06- EF 50-55%, mild dilation LV, mild MR w/ gradient   a.  Echo 10/2008:  EF 50-55%; mech MV prosthesis; mod BAE; mild to mod TR; PASP 40 mmHg Nuclear Stress 12/25/01 with no ischemia Diabetes mellitus, type II-lifestyle management Peripheral edema-venous insufficiency Elevated TSH, hx of Transient Ischemic attacks, hx of while on coumadin Ventricular ectopy, hx of- symptomatic 1. 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 2. Chronic atrial fibrillation, on Coumadin therapy. 3. History of recurrent transient ischemic attacks, now on Coumadin,     aspirin, and Persantine. 4. Hypertension. 5. Hyperlipidemia. 6. Positive family history of coronary heart disease.  Physician Roster:      Card - B. Brodie      GS-Streck  Review of Systems       ROS is negative except as outlined in HPI.   Vital Signs:  Patient profile:   77 year old male Height:      69 inches Weight:      190.50 pounds BMI:     28.23 Pulse (ortho):   105 / minute BP sitting:   147 / 87  (left arm) Cuff size:   regular  Vitals Entered By: Caralee Ates CMA (March 14, 2010 2:58 PM)  Physical  Exam  Additional Exam:  Gen. Well-nourished, in no distress   Neck: No JVD, thyroid not enlarged, no carotid bruits Lungs: No tachypnea, clear without rales, rhonchi or wheezes Cardiovascular: Rhythm irregular, PMI not displaced,  normal mitral prosthetic closure soundl, no murmurs or gallops, no peripheral edema, pulses normal in all 4 extremities. Abdomen: BS normal, abdomen soft and non-tender without masses or organomegaly, no hepatosplenomegaly. MS: No deformities, no cyanosis or clubbing   Neuro:  No focal sns   Skin:  no lesions    Impression & Recommendations:  Problem # 1:  CHEST PAIN (ICD-786.50) He recently had a Myoview for dilation of chest pain which was negative for  ischemia. His symptoms have improved and he feels they were related to reflux. He is currently on Nexium for this and in addition takes antiacids. I think this problem is stable. His updated medication list for this problem includes:    Persantine 50 Mg Tabs (Dipyridamole) .[name]... Take 1 by mouth three times a day qd    Enalapril Maleate 20 Mg Tabs (Enalapril maleate) .[name]... 1 two times a day    Cardizem Cd 180 Mg Xr24h-cap (Diltiazem hcl coated beads) .[name]... Take one capsule by mouth once daily    Adult Aspirin Low Strength 81 Mg Tbdp (Aspirin) .[name]... 1 by mouth once daily    Warfarin Sodium 5 Mg Tabs (Warfarin sodium) ..... Use as directed by anticoagulation clinic  Problem # 2:  ATRIAL FIBRILLATION (ICD-427.31)  He has chronic atrial fibrillation. His rates have been faster than ideal. He is currently on no rate control medications. His ejection fraction is somewhat depressed and this may be related to inadequate rate control. We will switch him from Norvasc 10 mg 2 Cardizem CD 180 mg. He will check his blood pressures and pulses are home and call us next week and we'll decide if we need to titrate his Cardizem dosage upward.  His updated medication list for this problem includes:    Persantine 50 Mg Tabs (Dipyridamole) .[name]... Take 1 by mouth three times a day qd    Adult Aspirin Low Strength 81 Mg Tbdp (Aspirin) .[name]... 1 by mouth once daily    Warfarin Sodium 5 Mg Tabs (Warfarin sodium) ..... Use as directed by anticoagulation clinic  His updated medication list for this problem includes:    Persantine 50 Mg Tabs (Dipyridamole) .[name]... Take 1 by mouth three times a day qd    Adult Aspirin Low Strength 81 Mg Tbdp (Aspirin) .[name]... 1 by mouth once daily    Warfarin Sodium 5 Mg Tabs (Warfarin sodium) ..... Use as directed by anticoagulation clinic  Orders: EKG w/ Interpretation (93000)  Problem # 3:  MITRAL VALVE REPLACEMENT, HX OF (ICD-V15.1) He has a Hall mitral prosthesis. He has had no problems  in this problem appears stable. He has had TIAs on warfarin and is currently on warfarin aspirin and Persantine for stroke prevention.  Problem # 4:  HYPERTENSION (ICD-401.9) This appears well controlled on current medications. His updated medication list for this problem includes:    Enalapril Maleate 20 Mg Tabs (Enalapril maleate) .[name]... 1 two times a day    Cardizem Cd 180 Mg Xr24h-cap (Diltiazem hcl coated beads) .[name]... Take one capsule by mouth once daily    Adult Aspirin Low Strength 81 Mg Tbdp (Aspirin) .[name]... 1 by mouth once daily    Furosemide 40 Mg Tabs (Furosemide) .[name]... Take 1 tablet by mouth once a day  Patient Instructions: 1)  Stop Norvasc (  amlodipine). 2)  Start Cardizem (diltiazem) 180mg  once daily. 3)  Please take and record your blood pressure and pulse readings over the next week and call us on 03/21/10 with your readings. 4)  Your physician wants you to follow-up in: 6 months with Dr. Shirlee Latch.  You will receive a reminder letter in the mail two months in advance. If you don't receive a letter, please call our office to schedule the follow-up appointment. Prescriptions: CARDIZEM CD 180 MG XR24H-CAP (DILTIAZEM HCL COATED BEADS) take one capsule by mouth once daily  #90 x 3   Entered by:   Sherri Rad, RN, BSN   Authorized by:   Lenoria Farrier, MD, [facility]   Signed by:   Sherri Rad, RN, BSN on 03/14/2010   Method used:   Electronically to        [facility]* (retail)             ,          Ph: [phone]       Fax: [phone]   RxID:   [id number] CARDIZEM CD 180 MG XR24H-CAP (DILTIAZEM HCL COATED BEADS) take one capsule by mouth once daily  #30 x 3   Entered by:   Sherri Rad, RN, BSN   Authorized by:   Lenoria Farrier, MD, [facility]   Signed by:   Sherri Rad, RN, BSN on 03/14/2010   Method used:   Electronically to        [facility]. [phone]* (retail)       [address]       [place], [place]  [address]        Ph: [phone]       Fax: [phone]   RxID:   [id number]

## 2010-04-27 NOTE — Progress Notes (Signed)
Summary: BP f/u   Phone Note Outgoing Call   Call placed by: Sherri Rad, RN, BSN,  April 10, 2010 5:25 PM Call placed to: Patient Summary of Call: BP readings received on 03/21/10. I have left message for the pt to call and see how he is feeling. Dr. Juanda Chance did not order any changes at that time, but he did have a few readings of 141/71 (12/25), 142/62 (12/25), & 152/73 (12/27). I wanted to f/u and make sure his readings are still ok. Sherri Rad, RN, BSN  April 10, 2010 5:27 PM   I spoke with the pt. He states he is feeling well. He reports that he is not recording readings every day, but did report 03/23/10- 138/61 HR- 66, 04/10/10- (8:40am)- 142/72 HR- 87, (8:44am) 130/67 HR- 83, (4:10pm) 149/66 HR- 76, (5:15pm) 137/63 HR- 68. I explained that if his readings consistently stay in the 140-150's systolic, then he should contact our office and let us know. He verbalizes understanding. Initial call taken by: Sherri Rad, RN, BSN,  April 11, 2010 9:08 AM

## 2010-04-27 NOTE — Assessment & Plan Note (Signed)
Summary: Cardiology Nuclear Testing  Nuclear Med Background Indications for Stress Test: Evaluation for Ischemia   History: Echo, Heart Catheterization, Myocardial Perfusion Study  History Comments: 25 yrs ago Cath:normal coronaries>MVR; '03 ZOX:WRUEAV,WU=98%; '10 Echo:EF=50-55%; h/o chronic afib, symptomatic V-Ectopy ( PVC's, Couplets, NSVT).  Symptoms: Chest Tightness, Chest Tightness with Exertion, Palpitations, Rapid HR  Symptoms Comments: Last episode of JX:BJYNWGNFAO feels like "I need to burp", last episode of sig. CP>back pain, 1 week ago.   Nuclear Pre-Procedure Cardiac Risk Factors: Family History - CAD, Hypertension, Lipids, NIDDM, TIA Caffeine/Decaff Intake: none NPO After: 6:00 PM Lungs: Clear. IV 0.9% NS with Angio Cath: 22g     IV Site: R Hand IV Started by: Cathlyn Parsons, RN Chest Size (in) 44     Height (in): 69 Weight (lb): 188 BMI: 27.86  Nuclear Med Study 1 or 2 day study:  1 day     Stress Test Type:  Stress Reading MD:  Olga Millers, MD     Referring MD:  Charlies Constable, MD Resting Radionuclide:  Technetium 54m Tetrofosmin     Resting Radionuclide Dose:  11.0 mCi  Stress Radionuclide:  Technetium 56m Tetrofosmin     Stress Radionuclide Dose:  33.0 mCi   Stress Protocol Exercise Time (min):  5:01 min     Max HR:  160 bpm     Predicted Max HR:  146 bpm  Max Systolic BP: 179 mm Hg     Percent Max HR:  109.59 %     METS: 7.0 Rate Pressure Product:  13086    Stress Test Technologist:  [employer]a [employer], CMA-N     Nuclear Technologist:  Domenic Polite, CNMT  Rest Procedure  Myocardial perfusion imaging was performed at rest 45 minutes following the intravenous administration of Technetium 62m Tetrofosmin.  Stress Procedure  The patient exercised for 5:01.  The patient stopped due to fatigue and denied any chest pain.  There were no diagnostic ST-T wave changes, but there was frequent ectopy with nonsustained v-tach, PVC's and couplets.  Technetium 20m  Tetrofosmin was injected at peak exercise and myocardial perfusion imaging was performed after a brief delay.  QPS Raw Data Images:  There is no interference from other nuclear activity. Stress Images:  Normal homogeneous uptake in all areas of the myocardium. Rest Images:  Normal homogeneous uptake in all areas of the myocardium. Subtraction (SDS):  No evidence of ischemia. Transient Ischemic Dilatation:  1.06  (Normal <1.22)  Lung/Heart Ratio:  0.21  (Normal <0.45)  Quantitative Gated Spect Images QGS EDV:  125 ml QGS ESV:  67 ml QGS EF:  46 % QGS cine images:  Global hypokinesis; left ventricular enlargement.   Overall Impression  Exercise Capacity: Fair exercise capacity. BP Response: Normal blood pressure response. Clinical Symptoms: No chest pain ECG Impression: No significant ST segment change suggestive of ischemia; patient in atrial fibrillation during the study; frequent PVCs and nonsustained VT. Overall Impression: Abnormal stress nuclear study with no ischemia or infarction; reduced LV function.  Appended Document: Cardiology Nuclear Testing Results reviewed with Dr. Juanda Chance- EF may be down to due increased HR's. The pt has an appt. on 12/20 with Dr. Juanda Chance- he states he will review this with the pt at his office visit.  Appended Document: Cardiology Nuclear Testing Results discussed with pt today at his office visit.

## 2010-04-27 NOTE — Medication Information (Signed)
Summary: rov/sp  Anticoagulant Therapy  Managed by: Weston Brass, PharmD Referring MD: [name] PCP: Link Snuffer MD: Graciela Husbands MD, Viviann Spare Indication 1: Mitral Valve Replacement (ICD-V43.3) Indication 2: TIA (ICD-435.9) Lab Used: [facility] Site: [facility] INR POC 3.1 INR RANGE 3-3.5  Dietary changes: no    Health status changes: no    Bleeding/hemorrhagic complications: no    Recent/future hospitalizations: no    Any changes in medication regimen? yes       Details: changed amlodipine to cardizem  Recent/future dental: no  Any missed doses?: no       Is patient compliant with meds? yes       Allergies: 1)  ! Sulfa  Anticoagulation Management History:      The patient is taking warfarin and comes in today for a routine follow up visit.  Positive risk factors for bleeding include an age of 75 years or older, history of CVA/TIA, and presence of serious comorbidities.  The bleeding index is 'high risk'.  Positive CHADS2 values include History of HTN, History of Diabetes, and Prior Stroke/CVA/TIA.  Negative CHADS2 values include Age > 75 years old.  The start date was 06/18/1997.  Anticoagulation responsible provider: Graciela Husbands MD, Viviann Spare.  INR POC: 3.1.  Cuvette Lot#: [id number].  Exp: 03/2011.    Anticoagulation Management Assessment/Plan:      The patient's current anticoagulation dose is Warfarin sodium 5 mg tabs: Use as directed by Anticoagulation Clinic.  The target INR is 3-3.5.  The next INR is due 04/18/2010.  Anticoagulation instructions were given to patient.  Results were reviewed/authorized by Weston Brass, PharmD.  He was notified by Weston Brass PharmD.         Current Anticoagulation Instructions: Same as Prior Instructions.

## 2010-04-27 NOTE — Progress Notes (Signed)
Summary: Nuc. Pre-Procedure  Phone Note Outgoing Call Call back at [facility] Phone [phone]   Call placed by: Irean Hong, RN,  March 06, 2010 11:35 AM Summary of Call: Reviewed information on Myoview Information Sheet (see scanned document for further details).  Spoke with patient.     Nuclear Med Background Indications for Stress Test: Evaluation for Ischemia    History Comments: 25 yrs ago Cath: NL coronaries>MVR. '03 MPS: (-) ischemia,EF=56%. 8/10 Echo: EF=50-55%. Hx. AFIB,Symptomatic V-Ectopy ( PVC's, Couplets, NSVT.  Symptoms: Chest Tightness with Exertion    Nuclear Pre-Procedure Cardiac Risk Factors: Family History - CAD, Hypertension, Lipids, NIDDM, TIA Height (in): 69  Nuclear Med Study Referring MD:  Charlies Constable MD

## 2010-05-03 NOTE — Medication Information (Signed)
Summary: rov/ewj  Anticoagulant Therapy  Managed by: [name], RN, BSN Referring MD: [name] PCP: Link Snuffer MD: Myrtis Ser MD, Tinnie Gens Indication 1: Mitral Valve Replacement (ICD-V43.3) Indication 2: TIA (ICD-435.9) Lab Used: [facility] Site: [facility] INR POC 2.9 INR RANGE 3-3.5  Dietary changes: no    Health status changes: no    Bleeding/hemorrhagic complications: no    Recent/future hospitalizations: no    Any changes in medication regimen? no    Recent/future dental: no  Any missed doses?: no       Is patient compliant with meds? yes       Allergies: 1)  ! Sulfa  Anticoagulation Management History:      The patient is taking warfarin and comes in today for a routine follow up visit.  Positive risk factors for bleeding include an age of 75 years or older, history of CVA/TIA, and presence of serious comorbidities.  The bleeding index is 'high risk'.  Positive CHADS2 values include History of HTN, History of Diabetes, and Prior Stroke/CVA/TIA.  Negative CHADS2 values include Age > 75 years old.  The start date was 06/18/1997.  Anticoagulation responsible provider: Myrtis Ser MD, Tinnie Gens.  INR POC: 2.9.  Cuvette Lot#: [id number].  Exp: 03/2011.    Anticoagulation Management Assessment/Plan:      The patient's current anticoagulation dose is Warfarin sodium 5 mg tabs: Use as directed by Anticoagulation Clinic.  The target INR is 3-3.5.  The next INR is due 05/23/2010.  Anticoagulation instructions were given to patient.  Results were reviewed/authorized by [name], RN, BSN.  He was notified by [name] RN.         Prior Anticoagulation Instructions: INR 3.1  Continue same dose of 1 1/2 tablets every day except 1 tablet on Monday, Wednesday and Friday.  Recheck INR in 4 weeks.   Current Anticoagulation Instructions: INR 2.9  Continue on same dosage 1.5 tablets daily except 1 tablet on Monday, Wednesdays, and Friday.  Recheck in 4 weeks.

## 2010-05-23 ENCOUNTER — Encounter (INDEPENDENT_AMBULATORY_CARE_PROVIDER_SITE_OTHER): Payer: Medicare Other

## 2010-05-23 ENCOUNTER — Encounter: Payer: Self-pay | Admitting: Cardiology

## 2010-05-23 DIAGNOSIS — G459 Transient cerebral ischemic attack, unspecified: Secondary | ICD-10-CM

## 2010-05-23 DIAGNOSIS — I059 Rheumatic mitral valve disease, unspecified: Secondary | ICD-10-CM

## 2010-05-23 DIAGNOSIS — Z7901 Long term (current) use of anticoagulants: Secondary | ICD-10-CM

## 2010-05-23 NOTE — Progress Notes (Signed)
Summary: Patients Blood Pressure Vitals   Patients Blood Pressure Vitals   Imported By: Roderic Ovens 05/12/2010 16:14:05  _____________________________________________________________________  External Attachment:    Type:   Image     Comment:   External Document

## 2010-06-01 NOTE — Medication Information (Signed)
Summary: rov/ewj  Anticoagulant Therapy  Managed by: Weston Brass, PharmD Referring MD: [name] PCP: Link Snuffer MD: Shirlee Latch MD, Freida Busman Indication 1: Mitral Valve Replacement (ICD-V43.3) Indication 2: TIA (ICD-435.9) Lab Used: [facility] Site: [facility] INR POC 2.7 INR RANGE 3-3.5  Dietary changes: no    Health status changes: no    Bleeding/hemorrhagic complications: no    Recent/future hospitalizations: no    Any changes in medication regimen? no    Recent/future dental: no  Any missed doses?: no       Is patient compliant with meds? yes       Allergies: 1)  ! Sulfa  Anticoagulation Management History:      The patient is taking warfarin and comes in today for a routine follow up visit.  Positive risk factors for bleeding include an age of 26 years or older, history of CVA/TIA, and presence of serious comorbidities.  The bleeding index is 'high risk'.  Positive CHADS2 values include History of HTN, History of Diabetes, and Prior Stroke/CVA/TIA.  Negative CHADS2 values include Age > 71 years old.  The start date was 06/18/1997.  Anticoagulation responsible provider: Shirlee Latch MD, Madai Nuccio.  INR POC: 2.7.  Cuvette Lot#: [id number].  Exp: 03/2011.    Anticoagulation Management Assessment/Plan:      The patient's current anticoagulation dose is Warfarin sodium 5 mg tabs: Use as directed by Anticoagulation Clinic.  The target INR is 3-3.5.  The next INR is due 06/06/2010.  Anticoagulation instructions were given to patient.  Results were reviewed/authorized by Weston Brass, PharmD.  He was notified by Margot Chimes PharmD Candidate.         Prior Anticoagulation Instructions: INR 2.9  Continue on same dosage 1.5 tablets daily except 1 tablet on Monday, Wednesdays, and Friday.  Recheck in 4 weeks.   Current Anticoagulation Instructions: INR 2.7  Take additional 1/2 tablet tonight.  Then start your new dose of 1.5 tablets daily except on Mondays and Fridays when  you only take 1 tablet. Recheck INR in 2 weeks.

## 2010-06-05 ENCOUNTER — Telehealth (INDEPENDENT_AMBULATORY_CARE_PROVIDER_SITE_OTHER): Payer: Self-pay | Admitting: *Deleted

## 2010-06-05 ENCOUNTER — Encounter: Payer: Self-pay | Admitting: Cardiology

## 2010-06-06 ENCOUNTER — Encounter: Payer: Self-pay | Admitting: Cardiology

## 2010-06-06 ENCOUNTER — Encounter: Payer: Self-pay | Admitting: Cardiovascular Disease

## 2010-06-06 ENCOUNTER — Encounter (INDEPENDENT_AMBULATORY_CARE_PROVIDER_SITE_OTHER): Payer: Medicare Other

## 2010-06-06 DIAGNOSIS — G459 Transient cerebral ischemic attack, unspecified: Secondary | ICD-10-CM

## 2010-06-06 DIAGNOSIS — Z954 Presence of other heart-valve replacement: Secondary | ICD-10-CM

## 2010-06-06 DIAGNOSIS — I059 Rheumatic mitral valve disease, unspecified: Secondary | ICD-10-CM

## 2010-06-06 DIAGNOSIS — Z9889 Other specified postprocedural states: Secondary | ICD-10-CM

## 2010-06-06 DIAGNOSIS — I6789 Other cerebrovascular disease: Secondary | ICD-10-CM

## 2010-06-06 DIAGNOSIS — I4891 Unspecified atrial fibrillation: Secondary | ICD-10-CM

## 2010-06-06 DIAGNOSIS — I6350 Cerebral infarction due to unspecified occlusion or stenosis of unspecified cerebral artery: Secondary | ICD-10-CM

## 2010-06-13 NOTE — Medication Information (Signed)
Summary: rov/tm  Anticoagulant Therapy  Managed by: Bethena Midget, RN, BSN Referring MD: [name] PCP: Link Snuffer MD: Clifton Sher MD, Cristal Deer Indication 1: Mitral Valve Replacement (ICD-V43.3) Indication 2: TIA (ICD-435.9) Lab Used: [facility] Site: [facility] INR POC 3.3 INR RANGE 3-3.5  Dietary changes: no    Health status changes: no    Bleeding/hemorrhagic complications: no    Recent/future hospitalizations: no    Any changes in medication regimen? yes       Details: Pt states he has taking a couple doses of Cholchicine and Mobic  Recent/future dental: no  Any missed doses?: no       Is patient compliant with meds? yes      Comments: He's aware to call if he experiences any bleeding or bruising and will continue to eat his leafy veggies.   Allergies: 1)  ! Sulfa  Anticoagulation Management History:      The patient is taking warfarin and comes in today for a routine follow up visit.  Positive risk factors for bleeding include an age of 3 years or older, history of CVA/TIA, and presence of serious comorbidities.  The bleeding index is 'high risk'.  Positive CHADS2 values include History of HTN, History of Diabetes, and Prior Stroke/CVA/TIA.  Negative CHADS2 values include Age > 24 years old.  The start date was 06/18/1997.  Anticoagulation responsible provider: Clifton Jabree MD, Cristal Deer.  INR POC: 3.3.  Cuvette Lot#: [id number].  Exp: 05/2011.    Anticoagulation Management Assessment/Plan:      The patient's current anticoagulation dose is Warfarin sodium 5 mg tabs: Use as directed by Anticoagulation Clinic.  The target INR is 3-3.5.  The next INR is due 07/04/2010.  Anticoagulation instructions were given to patient.  Results were reviewed/authorized by Bethena Midget, RN, BSN.  He was notified by Bethena Midget, RN, BSN.         Prior Anticoagulation Instructions: INR 2.7  Take additional 1/2 tablet tonight.  Then start your new dose of 1.5 tablets daily  except on Mondays and Fridays when you only take 1 tablet. Recheck INR in 2 weeks.    Current Anticoagulation Instructions: INR 3.3 Continue 1.5 pills everyday except 1 pill on Mondays and Fridays. Recheck in 3-4 weeks.

## 2010-06-13 NOTE — Progress Notes (Signed)
Summary: NEEDS OV PLEASE HELP  Phone Note Call from Patient   Summary of Call: Patient is requesting labs - Used to get this at his wk but is retired now.  Initial call taken by: Lamar Sprinkles, CMA,  June 05, 2010 10:05 AM  Follow-up for Phone Call        What labs are we talking about. As a medicare patient - even with blue med will labs be covered without an ov  to allow for incident to criteria. If he has coverage or is willing to take the risk (ABN) then : lipid 272.4, hepatic 995.20 metabolic panel 401.9; A1C 250.00, PSA V76.44 Follow-up by: Jacques Navy MD,  June 05, 2010 1:04 PM  Additional Follow-up for Phone Call Additional follow up Details #1::        Spoke w/pt - he would like office visit w/Norins first. Please help set pt & wife (see her phone note) for f/u apt (pt does not want cpx) 30 min for each please  [facility]!! Additional Follow-up by: Lamar Sprinkles, CMA,  June 05, 2010 6:48 PM    Additional Follow-up for Phone Call Additional follow up Details #2::    PT made 30 min appt,for 4/3. Follow-up by: Verdell Face,  June 06, 2010 8:42 AM

## 2010-06-27 ENCOUNTER — Encounter: Payer: Self-pay | Admitting: Internal Medicine

## 2010-06-27 ENCOUNTER — Ambulatory Visit (INDEPENDENT_AMBULATORY_CARE_PROVIDER_SITE_OTHER): Payer: Medicare Other | Admitting: Internal Medicine

## 2010-06-27 DIAGNOSIS — I2510 Atherosclerotic heart disease of native coronary artery without angina pectoris: Secondary | ICD-10-CM

## 2010-06-27 DIAGNOSIS — I10 Essential (primary) hypertension: Secondary | ICD-10-CM

## 2010-06-27 DIAGNOSIS — E785 Hyperlipidemia, unspecified: Secondary | ICD-10-CM

## 2010-06-27 DIAGNOSIS — M109 Gout, unspecified: Secondary | ICD-10-CM | POA: Insufficient documentation

## 2010-06-27 DIAGNOSIS — E119 Type 2 diabetes mellitus without complications: Secondary | ICD-10-CM

## 2010-06-27 DIAGNOSIS — I4891 Unspecified atrial fibrillation: Secondary | ICD-10-CM

## 2010-06-27 MED ORDER — DILTIAZEM HCL ER COATED BEADS 240 MG PO CP24: 240.0000 mg | ORAL_CAPSULE | Freq: Every day | ORAL | Status: DC

## 2010-06-27 NOTE — Progress Notes (Signed)
  Subjective:    Patient ID: Anthony Gould, male    DOB: 1934-03-16, 77 y.o.   MRN: [id number]  HPI Anthony Gould presents today to review recent BP readings since he has been switched from norvasc to diltiazem. Reviewed Dr. Regino Schultze note from Dec 20th, change was made for better rate control of a. Fib. He had c/o chest pain and underwent myoview stress testing Dec 13th - reviewed report - which revealed no ischemia or infarction, decreased EF @ 46% with global hypokinesis. reviewed echo from August '10 with EF 55-60%. Shared all this information with the patient.   In addition he is interested in routine lab follow-up since he is retired and no longer has this done at work.   Lastly, he is asking if it is ok to return to a regular exercise program.   PMH, FamHs and SocHx reveiwed for relevance and change.    Review of Systems  Constitutional: Negative.   HENT: Negative.   Eyes: Negative.   Respiratory: Negative.   Cardiovascular: Negative.   Gastrointestinal: Negative.   Genitourinary: Negative.   Musculoskeletal: Negative.   Skin: Negative.   Neurological: Negative.        Objective:   Physical Exam  Nursing note and vitals reviewed. Constitutional: He is oriented to person, place, and time. He appears well-developed and well-nourished.  HENT:  Head: Normocephalic and atraumatic.  Eyes: Conjunctivae and EOM are normal.  Cardiovascular:       IRIR rate controlled. Audible mechanical valve click  Pulmonary/Chest: Effort normal.  Musculoskeletal: Normal range of motion.  Neurological: He is alert and oriented to person, place, and time.  Skin: Skin is warm and dry.  Psychiatric: He has a normal mood and affect. His behavior is normal. Thought content normal.          Assessment & Plan:  1. Hypetension - suboptimal control after a review of home readings  Plan - will continue all meds but increase diltiazem to 240mg  once a day.            He will keep a record of  BP readings           He is cautioned to watch for bradycardia.  2. Coronary disease - He had a recent negative myoview stress test.  Plan - may resume all normal activity including exercise.   3. Lipids - due for lab  4. Diabetes - diet managed. Due for A1C with recommendations to follow

## 2010-06-28 ENCOUNTER — Other Ambulatory Visit (INDEPENDENT_AMBULATORY_CARE_PROVIDER_SITE_OTHER): Payer: Medicare Other

## 2010-06-28 DIAGNOSIS — I6350 Cerebral infarction due to unspecified occlusion or stenosis of unspecified cerebral artery: Secondary | ICD-10-CM

## 2010-06-28 DIAGNOSIS — G459 Transient cerebral ischemic attack, unspecified: Secondary | ICD-10-CM

## 2010-06-28 DIAGNOSIS — E785 Hyperlipidemia, unspecified: Secondary | ICD-10-CM

## 2010-06-28 DIAGNOSIS — E119 Type 2 diabetes mellitus without complications: Secondary | ICD-10-CM

## 2010-06-28 DIAGNOSIS — M109 Gout, unspecified: Secondary | ICD-10-CM

## 2010-06-28 DIAGNOSIS — I10 Essential (primary) hypertension: Secondary | ICD-10-CM

## 2010-06-28 DIAGNOSIS — Z9889 Other specified postprocedural states: Secondary | ICD-10-CM

## 2010-06-28 DIAGNOSIS — I4891 Unspecified atrial fibrillation: Secondary | ICD-10-CM

## 2010-06-28 DIAGNOSIS — I059 Rheumatic mitral valve disease, unspecified: Secondary | ICD-10-CM

## 2010-06-28 LAB — COMPREHENSIVE METABOLIC PANEL
AST: 30 U/L (ref 0–37)
Albumin: 4 g/dL (ref 3.5–5.2)
Alkaline Phosphatase: 80 U/L (ref 39–117)
Potassium: 4.9 mEq/L (ref 3.5–5.1)
Sodium: 141 mEq/L (ref 135–145)
Total Protein: 6.9 g/dL (ref 6.0–8.3)

## 2010-06-28 LAB — HEMOGLOBIN A1C: Hgb A1c MFr Bld: 5.9 % (ref 4.6–6.5)

## 2010-06-28 LAB — T4, FREE: Free T4: 1 ng/dL (ref 0.60–1.60)

## 2010-06-28 LAB — HEPATIC FUNCTION PANEL
ALT: 22 U/L (ref 0–53)
Bilirubin, Direct: 0.3 mg/dL (ref 0.0–0.3)
Total Bilirubin: 1.2 mg/dL (ref 0.3–1.2)

## 2010-06-28 LAB — LIPID PANEL
LDL Cholesterol: 70 mg/dL (ref 0–99)
VLDL: 13.2 mg/dL (ref 0.0–40.0)

## 2010-06-28 LAB — URIC ACID: Uric Acid, Serum: 4.3 mg/dL (ref 4.0–7.8)

## 2010-06-28 LAB — MAGNESIUM: Magnesium: 1.6 mg/dL (ref 1.5–2.5)

## 2010-06-29 ENCOUNTER — Encounter: Payer: Self-pay | Admitting: Internal Medicine

## 2010-07-02 ENCOUNTER — Encounter: Payer: Self-pay | Admitting: Internal Medicine

## 2010-07-04 ENCOUNTER — Ambulatory Visit (INDEPENDENT_AMBULATORY_CARE_PROVIDER_SITE_OTHER): Payer: Medicare Other | Admitting: *Deleted

## 2010-07-04 DIAGNOSIS — I4891 Unspecified atrial fibrillation: Secondary | ICD-10-CM

## 2010-07-04 DIAGNOSIS — Z9889 Other specified postprocedural states: Secondary | ICD-10-CM

## 2010-07-04 DIAGNOSIS — I6350 Cerebral infarction due to unspecified occlusion or stenosis of unspecified cerebral artery: Secondary | ICD-10-CM

## 2010-07-04 DIAGNOSIS — Z7901 Long term (current) use of anticoagulants: Secondary | ICD-10-CM | POA: Insufficient documentation

## 2010-07-04 DIAGNOSIS — I059 Rheumatic mitral valve disease, unspecified: Secondary | ICD-10-CM

## 2010-07-04 DIAGNOSIS — G459 Transient cerebral ischemic attack, unspecified: Secondary | ICD-10-CM

## 2010-07-25 ENCOUNTER — Telehealth: Payer: Self-pay

## 2010-07-25 NOTE — Telephone Encounter (Signed)
Patient called lmovm stating that BP medication was changed at last appointment. He would like a call back to report bp readings to decide if med will remain the same.

## 2010-08-01 ENCOUNTER — Ambulatory Visit (INDEPENDENT_AMBULATORY_CARE_PROVIDER_SITE_OTHER): Payer: Medicare Other | Admitting: *Deleted

## 2010-08-01 DIAGNOSIS — I059 Rheumatic mitral valve disease, unspecified: Secondary | ICD-10-CM

## 2010-08-01 DIAGNOSIS — I6350 Cerebral infarction due to unspecified occlusion or stenosis of unspecified cerebral artery: Secondary | ICD-10-CM

## 2010-08-01 DIAGNOSIS — I4891 Unspecified atrial fibrillation: Secondary | ICD-10-CM

## 2010-08-01 DIAGNOSIS — Z9889 Other specified postprocedural states: Secondary | ICD-10-CM

## 2010-08-01 DIAGNOSIS — G459 Transient cerebral ischemic attack, unspecified: Secondary | ICD-10-CM

## 2010-08-01 LAB — POCT INR: INR: 3.3

## 2010-08-08 NOTE — Assessment & Plan Note (Signed)
[facility]                            CARDIOLOGY OFFICE NOTE   Anthony Gould, Anthony Gould                    MRN:          [id number]  DATE:11/06/2006                            DOB:          1933-07-13    PRIMARY CARE PHYSICIAN:  Rosalyn Gess. 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 He had  a TIA with some visual symptoms while on Coumadin and had been on  Persantine for about 20 years. When I saw him a year ago, we found he  was in atrial fibrillation for the first time. He was asymptomatic from  this and was already on Coumadin for his valve. His rate was borderline  and we talked about rate control medications, but he had not tolerated  beta-blockers in the past and was reluctant to begin anything new.   He says he has done quite well since that time with no symptoms of chest  pain, shortness of breath or palpitations. He has had some more visual  symptoms over the past two weeks with some eye symptoms and he was  advised to go on a baby aspirin along with his Persantine and Coumadin  by our Coumadin Clinic.   PAST MEDICAL HISTORY:  1. Hypertension.  2. Hyperlipidemia.   CURRENT MEDICATIONS:  1. Coumadin.  2. Persantine.  3. Vasotec.  4. Allopurinol.  5. Indapamide.  6. Nexium.  7. Lipitor.  8. Magnesium.  9. Norvasc.  10.He is now on baby aspirin.   PHYSICAL EXAMINATION:  Today, the blood pressure was 154/88, pulse 79  and irregular. There was no venous distention. The carotid pulses were  full and there were no bruits.  CHEST: Was clear without rales or rhonchi.  CARDIAC: Rhythm was irregular. There was a normal prosthetic closure  sound followed by an aortic closure sound and a very soft opening sound  of the prosthesis. There was a short systolic murmur at the left sternal  edge. I could hear no diastolic murmur.  ABDOMEN:  Was soft with normal bowel sounds. There was no  hepatosplenomegaly.  The peripheral pulses were full and there was no peripheral edema.   An electrocardiogram showed what appeared to be atrial fibrillation with  PVCs. There is left axis deviation and intraventricular conduction  delay.   Recent laboratory studies showed excellent lipid profile with a total  cholesterol of 169 and HDL 69, LDL of 83. His fasting sugar was slightly  elevated 111. His TSH was also slightly elevated at 7.97.   IMPRESSION:  1. 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  2. Right visual symptoms, possibly indicative of transient ischemic      attacks.  3. Atrial fibrillation, now chronic and asymptomatic with borderline      rate control.  4. Hypertension.  5. Hyperlipidemia.  6. Positive family history of coronary heart disease.   RECOMMENDATIONS:  I think Anthony Gould is doing fairly well. His rate  control is borderline, but he is somewhat reluctant to be on any rate  control medications. His recent eye symptoms could be TIAs and I think I  agree with putting him on a baby aspirin. We are currently targeting his  INRs from 3 to 4. We will plan to evaluate him with a transthoracic  echocardiogram to look at his mitral prosthesis and his left atrial  size. I am also interested in his LV function to make sure that he does  not have left ventricular dysfunction related to inadequately controlled  ventricular rate from his atrial fibrillation. If his echo looks good, I  will plan to see him back in followup in one year.     Bruce Elvera Lennox Juanda Chance, MD, [facility]  Electronically Signed    BRB/MedQ  DD: 11/06/2006  DT: 11/07/2006  Job #: [id number]

## 2010-08-08 NOTE — Assessment & Plan Note (Signed)
[facility]                            CARDIOLOGY OFFICE NOTE   Anthony Gould, Anthony Gould                    MRN:          [id number]  DATE:11/17/2007                            DOB:          15-Sep-1933    PRIMARY CARE PHYSICIAN:  Anthony Gess. Norins, MD   CLINICAL HISTORY:  Anthony Gould is 77 years old who returned for a  followup of valvular heart disease.  He had a Hall mitral valve  replacement using __________ ruptured chord from [place].  He had  been put on Persantine 20 years ago, and we recently started him on  aspirin, which he says there is much great improvement and things  __________.   He says he has been quite well from the standpoint of his heart.  He has  had no recent transplant or shortness breath or pulmonary insufficiency.  He checks his blood pressures __________ in the range of 140 systolic.  His heart rate he says has not been over 100 __________.  He is in for  hypertension and hyperlipidemia.   CURRENT MEDICATIONS:  __________, Norvasc, aspirin, Vasotec, furosemide,  __________.   On examination, the blood pressure is 150/90 and pulse 95 and regular.  There is no venous distension.  The carotid pulses are full without  bruit.  His chest was clear.  His heart rhythm was irregular.  There was  a normal __________ closure sound and normal opening sound.  There is a  short systolic murmur.  The abdomen was soft with normal bowel sounds.  There is no hepatosplenomegaly.  Peripheral pulses are full, and there  is no peripheral edema.   Electrocardiogram showed atrial fibrillation with a rate of 100 and  there were nonspecific ST-T changes.   IMPRESSION:  1. 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  2. Chronic atrial fibrillation, on Coumadin therapy.  3. History of recurrent transient ischemic attacks, now on Coumadin,      aspirin, and Persantine.  4.  Hypertension.  5. Hyperlipidemia.  6. Positive family history of coronary heart disease.   RECOMMENDATIONS:  Overall, I think Anthony Gould is doing fairly well.  I am not real happy about his rate control, but he has been very  reluctant to be on beta-blockers.  I will switched his medicines for  better rate control.  He is intent on getting better blood pressure  control, and we plan to increase his Vasotec from 30 mg a day to 20 mg  b.i.d.  He also indicated he had a friend who did very well with  Micardis.  If Anthony Gould monitors his blood pressure at home and if  they are not in the target range of 130, then I asked him to follow up  with Dr. Debby Gould.  He can  decide about further adjustments including the possibility of adding  Micardis.  We will get a BMP and CBC on him today, and I will see him  back in followup in 1 year.     Anthony Elvera Lennox Juanda Chance,  MD, [facility]  Electronically Signed    BRB/MedQ  DD: 11/17/2007  DT: 11/18/2007  Job #: [id number]   cc:   Anthony Gess. Norins, MD

## 2010-08-09 ENCOUNTER — Telehealth: Payer: Self-pay | Admitting: *Deleted

## 2010-08-09 NOTE — Telephone Encounter (Signed)
Patient requesting REFILL of BP med to go to [facility] but req a call back to give his BP readings first.

## 2010-08-09 NOTE — Telephone Encounter (Signed)
Left mess to call office back.   

## 2010-08-10 MED ORDER — DILTIAZEM HCL ER COATED BEADS 240 MG PO CP24: 240.0000 mg | ORAL_CAPSULE | Freq: Every day | ORAL | Status: DC

## 2010-08-10 NOTE — Telephone Encounter (Signed)
Pt left mess - he says he has left BP readings for MD to review and would like RF of BP med to go to [facility]. OK?

## 2010-08-10 NOTE — Telephone Encounter (Signed)
Rx sent in, Patient informed

## 2010-08-10 NOTE — Telephone Encounter (Signed)
Not having much luck locating BP readings. Will continue to look. He may have a refill on his BP medication for 90 days to [facility].

## 2010-08-11 NOTE — Assessment & Plan Note (Signed)
[facility]                              CARDIOLOGY OFFICE NOTE   SATHVIK, TIEDT                    MRN:          [id number]  DATE:11/19/2005                            DOB:          Jul 24, 1933    PRIMARY CARE PHYSICIAN:  Rosalyn Gess. Norins, MD.   CLINICAL HISTORY:  [name] is 77 years old and has had a Hall mitral  valve replaced 20 years ago for a ruptured cordae.  This was performed in  [place].  He has had a TIA while on Coumadin and has been taking  Persantine in addition to his Coumadin because of this.  He says he has been  doing quite well with his heart and has had no chest pain, shortness of  breath or palpitations.   PAST MEDICAL HISTORY:  1. Hyperlipidemia.  2. Hypertension.   CURRENT MEDICATIONS:  1. Coumadin.  2. Persantine.  3. Vasotec.  4. Allopurinol.  5. Indapamide.  6. Nexium.  7. Lipitor.  8. Magnesium.  9. Norvasc.   PHYSICAL EXAMINATION:  VITAL SIGNS:  Blood pressure 122/70, pulse 94 and  irregular.  NECK:  There was no vein distension.  The carotid pulses were full without  bruits.  CHEST:  Clear.  CARDIOVASCULAR:  Irregular rhythm.  There was normal prosthetic closure  sound.  Short _________ left sternal edge.  ABDOMEN:  Soft without organomegaly.  EXTREMITIES:  There was trace peripheral edema and pedal pulses were equal.   Electrocardiogram showed atrial fibrillation which was new with a  ventricular rate of 90 and there was occasional PVCs.  There was also a left  axis deviation and poor R-wave progression.   IMPRESSION:  1. Status post Hall mitral valve replacement for a ruptured cordae 20      years ago, stable.  2. Atrial fibrillation, new onset, asymptomatic.  3. Hypertension.  4. Hyperlipidemia.  5. Positive family history for coronary artery disease.  6. Status post cholecystectomy.  7. Shrimp allergy.  8. Venous distension in lower extremities.   RECOMMENDATIONS:  I think  Mr. Heemstra is doing well.  He does have what  appears to be new onset atrial fibrillation but he is currently on Coumadin  for his prosthetic valve and he is asymptomatic.  I discussed the  possibility of adding something for rate control but he had terrible side  effects with beta-blockers.  He is a little reluctant to change anything and  his rate is not very fast.  He has checked his rates and they have been in  the 70s to 80s at home.  I have told him if he gets up in the 90s  consistently, that we want to make a change and will either add Lanoxin or  switch from Norvasc to Cardizem.  He also had some lower extremity edema  which Dr. Debby Bud has attributed to venous insufficiency but chief concern  may be partly related to the Norvasc.  I reassured him that I did not think  Norvasc put him at risk for congestive heart failure, although it could  contribute to peripheral edema.  We decided not to change any of his  medications.  He will call if his heart rate gets up and otherwise will see  him back in a year.   His laboratory studies were good with a good cholesterol panel, mildly  elevated TSH and a borderline elevated glucose.                               Bruce Elvera Lennox Juanda Chance, MD, [facility]    BRB/MedQ  DD:  11/19/2005  DT:  11/20/2005  Job #:  [id number]   cc:   Rosalyn Gess. Norins, MD

## 2010-08-11 NOTE — Op Note (Signed)
NAME:  Anthony Gould, Anthony Gould                       ACCOUNT NO.:  [id number]   MEDICAL RECORD NO.:  [id number]                   PATIENT TYPE:  INP   LOCATION:  0373                                 FACILITY:  [facility]   PHYSICIAN:  Currie Paris, M.D.           DATE OF BIRTH:  October 03, 1933   DATE OF PROCEDURE:  DATE OF DISCHARGE:                                 OPERATIVE REPORT   CCS [phone].   PREOPERATIVE DIAGNOSIS:  Chronic calculous cholecystitis.   POSTOPERATIVE DIAGNOSIS:  Chronic calculous cholecystitis.   PROCEDURE:  Laparoscopic cholecystectomy with operative cholangiogram.   SURGEON:  Currie Paris, M.D.   ASSISTANT:  Ollen Gross. Carolynne Edouard, M.D.   ANESTHESIA:  General endotracheal.   CLINICAL HISTORY:  This patient is a 77 year old with biliary-type symptoms  and multiple stones.  He has an artificial valve, has been on Coumadin.  He  was admitted by Dr. Regino Schultze service a couple of days preoperatively for  conversion to heparin therapy.   DESCRIPTION OF PROCEDURE:  The patient was seen today in the holding area  and had no further questions.  He had been off his heparin for six hours.  He was taken to the operating room and after satisfactory general  endotracheal anesthesia had been obtained, the abdomen was prepped and  draped.  Marcaine 0.25% was used for the incisions and an umbilical incision  made first.  The fascia was opened and the peritoneal cavity entered under  direct vision and a pursestring placed.  The Hasson was introduced and the  abdomen insufflated to 15.   Three additional trocars were placed in the usual positions.  The  gallbladder was a little tense but not acutely inflamed.  Upon grasping it,  it was thin-walled and we spilled some bile from it, which was suctioned up.  The gallbladder was retracted over the liver and with traction on the  infundibulum of the gallbladder, we were able to open a window to see the  cystic artery, both the  anterior and posterior branches, as well as the  cystic duct.  One clip was placed on the anterior branch of the cystic  artery, another on the cystic duct near the gallbladder.   The cystic duct was opened and a Cook catheter introduced and operative  cholangiography done.  He had what looked like a long cystic duct, good  filling of the common duct into the duodenum and up into the right and left  hepatic ducts.   The cystic duct catheter was removed and three clips placed in the stay side  of the duct, and it was divided.  Additional clips were placed on both  anterior and posterior branches of the cystic arteries, and they were  divided.  The gallbladder was removed from below to above.   Because the patient was going back on Coumadin postoperatively, we spent a  lot of time making sure everything along  the bed was dry.  We had actually  very minimal bleeding during this procedure, but I did leave some Surgicel  in there nevertheless.   The gallbladder was placed in a bag and brought out the umbilical port.  A  final irrigation and final check for hemostasis was made, and again  everything was dry.  I thought we had gotten all the bile debrided out and  suctioned out.   The lateral ports were removed under direct vision.  There was no bleeding.  The umbilical port was removed and the pursestring tied down.  The abdomen  was deflated through the epigastric port.  Skin was closed with 4-0 Monocryl  subcuticular plus Steri-Strips.   The patient tolerated the procedure well.  There were no operative  complications.  All counts were correct.                                                 Currie Paris, M.D.    CJS/MEDQ  D:  01/22/2002  T:  01/22/2002  Job:  [id number]   cc:   Charlies Constable, MD [facility]  [address]  [place]  [place] [address]

## 2010-08-11 NOTE — Discharge Summary (Signed)
NAME:  Anthony Gould, Anthony Gould                       ACCOUNT NO.:  [id number]   MEDICAL RECORD NO.:  [id number]                   PATIENT TYPE:  INP   LOCATION:  0373                                 FACILITY:  [facility]   PHYSICIAN:  Charlies Constable, M.D. [facility]              DATE OF BIRTH:  01-10-1934   DATE OF ADMISSION:  01/19/2002  DATE OF DISCHARGE:  01/28/2002                                 DISCHARGE SUMMARY   DISCHARGE DIAGNOSES:  1. Cholelithiasis, status post cholecystectomy.  2. Status post mitral valve replacement with ____________ prosthesis in     October 1986.  3. Chronic anticoagulation therapy.  4. History of transient ischemic attack.  5. Hyperlipidemia.  6. Hypertension.   HOSPITAL COURSE:  The patient is a 77 year old male who was scheduled for  cholecystectomy per Dr. Jamey Ripa on January 22, 2002.  He was admitted two  days early so that he could be given IV heparin in place of Coumadin  to  bridge him until the time of surgery.  It was also noted that he would need  prophylaxis for subacute bacterial endocarditis.   On January 22, 2002, the patient was taken to the operating room by Dr.  Cyndia Bent.  The patient underwent a laparoscopic cholecystectomy  without difficulty.  Dr. Jamey Ripa ordered that heparin be started the next  morning so that it would be clear to restart Coumadin the following evening.   Over the next several days the patient did well from a surgical standpoint.  However, his INR remained resistant to increasing doses of Coumadin.  On the  evening of January 27, 2002, the patient received a single dose of 20 mg of  Coumadin.   The next day the patient was doing well, had no complaints.  It was noted  his INR was 2.7.  It was felt he was ready for discharge.  The patient's IV  heparin was stopped, and approximately one hour later he was given 40 mg of  subcutaneous Lovenox.  The plan was for him to use Lovenox at home until  seen in the Coumadin  clinic the following day.   DISCHARGE MEDICATIONS:  1. Lovenox 40 mg subcutaneous q.12h. x 2.  2. Persantine 50 mg t.i.d.  3. Vasotec 20 mg q.d.  4. Lipitor 10 mg q.h.s.  5. Allopurinol 300 mg q.d.  6. Lozol 5 mg q.d.  7. Coumadin 10 mg on the night of discharge, with further dosing to be     adjusted by the Coumadin clinic.   DISCHARGE INSTRUCTIONS:  1. The patient is to follow discharge instructions from surgery on pain     management, activity, and follow-up appointments.  2.     He is to follow a low-fat, low-cholesterol diet.  3. He will have his PT/INR checked at the [facility] on     January 29, 2002.  4. He is to follow up with Dr.  Brodie's P.A. on March 02, 2002, at 10:30     in the morning.     Annett Fabian, P.A. [facility]                  Charlies Constable, M.D. [facility]    CKM/MEDQ  D:  01/28/2002  T:  01/28/2002  Job:  [id number]   cc:   Rosalyn Gess. Norins, M.D. [facility]   Currie Paris, M.D.  Fax: [phone]

## 2010-08-20 ENCOUNTER — Other Ambulatory Visit: Payer: Self-pay | Admitting: Internal Medicine

## 2010-08-29 ENCOUNTER — Encounter: Payer: Medicare Other | Admitting: *Deleted

## 2010-09-01 ENCOUNTER — Ambulatory Visit (INDEPENDENT_AMBULATORY_CARE_PROVIDER_SITE_OTHER): Payer: Medicare Other | Admitting: *Deleted

## 2010-09-01 DIAGNOSIS — I6350 Cerebral infarction due to unspecified occlusion or stenosis of unspecified cerebral artery: Secondary | ICD-10-CM

## 2010-09-01 DIAGNOSIS — I4891 Unspecified atrial fibrillation: Secondary | ICD-10-CM

## 2010-09-01 DIAGNOSIS — G459 Transient cerebral ischemic attack, unspecified: Secondary | ICD-10-CM

## 2010-09-01 DIAGNOSIS — I059 Rheumatic mitral valve disease, unspecified: Secondary | ICD-10-CM

## 2010-09-01 DIAGNOSIS — Z9889 Other specified postprocedural states: Secondary | ICD-10-CM

## 2010-09-01 LAB — POCT INR: INR: 3.3

## 2010-09-12 ENCOUNTER — Encounter: Payer: Self-pay | Admitting: Cardiology

## 2010-09-12 ENCOUNTER — Ambulatory Visit (INDEPENDENT_AMBULATORY_CARE_PROVIDER_SITE_OTHER): Payer: Medicare Other | Admitting: Cardiology

## 2010-09-12 DIAGNOSIS — G459 Transient cerebral ischemic attack, unspecified: Secondary | ICD-10-CM

## 2010-09-12 DIAGNOSIS — Z952 Presence of prosthetic heart valve: Secondary | ICD-10-CM

## 2010-09-12 DIAGNOSIS — I4891 Unspecified atrial fibrillation: Secondary | ICD-10-CM

## 2010-09-12 DIAGNOSIS — I059 Rheumatic mitral valve disease, unspecified: Secondary | ICD-10-CM

## 2010-09-12 DIAGNOSIS — I10 Essential (primary) hypertension: Secondary | ICD-10-CM

## 2010-09-12 DIAGNOSIS — I2510 Atherosclerotic heart disease of native coronary artery without angina pectoris: Secondary | ICD-10-CM

## 2010-09-12 DIAGNOSIS — Z9889 Other specified postprocedural states: Secondary | ICD-10-CM

## 2010-09-12 MED ORDER — DILTIAZEM HCL ER COATED BEADS 360 MG PO CP24: 360.0000 mg | ORAL_CAPSULE | Freq: Every day | ORAL | Status: DC

## 2010-09-12 NOTE — Patient Instructions (Signed)
Increase Diltiazem to 360mg  daily.  Take and record your blood pressure. I will call you in 10 days to get the readings.  Schedule an appointment for an echocardiogram in 1 year.(June 2013).  Your physician wants you to follow-up in: 1 year with Dr. Shirlee Latch a few days after you have the echocardiogram. You will receive a reminder letter in the mail two months in advance. If you don't receive a letter, please call our office to schedule the follow-up appointment.

## 2010-09-13 NOTE — Assessment & Plan Note (Signed)
History of TIAs while on coumadin.  Has been on dipyridamole long-termn.

## 2010-09-13 NOTE — Progress Notes (Signed)
PCP: Dr. Debby Bud  77 yo with history of mechanical mitral valve replacement for severe mitral regurgitation and permanent atrial fibrillation presents for cardiology followup.  Patient has a history of possible TIAs for which he has has been on aspirin and dipyridamole long-term.  Every 2-3 weeks, he has an episode where he gets a "funny feeling" in his chest.  This can be with and without exertion.  Sometimes it is relieved by belching.  This is a chronic symptom, and patient had an ETT-myoview in 12/11 with no ischemia or infarction.  Patient golfs once a week and does yardwork with no exertional shortness of breath.  No recent stroke-like symptoms.    BP is high today.  Patient checks it at home occasionally and it runs 130s-140s systolic.   ECG: atrial fibrillation at 97, left axis deviation, PVC  Labs (4/12): LDL 70, HDL 59, K 4.9, creatinine 1.0, TSH 6.5 (elevated), LFTs normal  PMH: 1. Atrial fibrillation: Permanent, on coumadin.  He has not been able to tolerate beta blockers due to severe fatigue.  2. DM2: diet-controlled 3. HTN 4. Hyperlipidemia 5. 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  Patient was started on dipyridamole years ago for this.   6. 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  Last echo in 8/10 with EF 50-55%, mildly enlarged LV, mechanical mitral valve, mildly dilated RV, PA systolic pressure 40 mmHg.  7. ETT-myoview (12/11): EF 46%, no evidence for ischemia or infarction.   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  Former Writer.  Lives in [place].   FH: Father with MI at 57.  Mother with CVA.   ROS: All systems reviewed and negative except as per HPI.    Current Outpatient Prescriptions  Medication Sig Dispense Refill  . allopurinol (ZYLOPRIM) 300 MG tablet Take 300 mg by mouth daily.        [name] aspirin 81 MG EC tablet Take 81 mg by mouth daily.        [name] atorvastatin (LIPITOR) 20 MG tablet Take 20 mg by  mouth daily.        . celecoxib (CELEBREX) 100 MG capsule Take 100 mg by mouth 2 (two) times daily as needed.        . dipyridamole (PERSANTINE) 50 MG tablet Take 50 mg by mouth 3 (three) times daily.        . enalapril (VASOTEC) 20 MG tablet Take 20 mg by mouth 2 (two) times daily.        [name] esomeprazole (NEXIUM) 40 MG capsule Take 40 mg by mouth daily before breakfast.        . furosemide (LASIX) 40 MG tablet TAKE 1 TABLET DAILY  90 tablet  2  . magnesium oxide (MAG-OX) 400 MG tablet Take 400 mg by mouth 3 (three) times daily.        [name] warfarin (COUMADIN) 5 MG tablet Take by mouth as directed.        . diltiazem (CARDIZEM CD) 360 MG 24 hr capsule Take 1 capsule (360 mg total) by mouth daily.  30 capsule  11    BP 170/80  Pulse 97  Resp 18  Ht 5\' 10"  (1.778 m)  Wt 189 lb 12.8 oz (86.093 kg)  BMI 27.23 kg/m2 General: NAD Neck: No JVD, no thyromegaly or thyroid nodule.  Lungs: Clear to auscultation bilaterally with normal respiratory effort. CV: Nondisplaced PMI.  Heart regular S1/S2, mechanical S1, no S3/S4, 1/6 systolic murmur along the sternal  border.  No peripheral edema.  No carotid bruit.  Normal pedal pulses.  Abdomen: Soft, nontender, no hepatosplenomegaly, no distention.  Neurologic: Alert and oriented x 3.  Psych: Normal affect. Extremities: No clubbing or cyanosis.

## 2010-09-13 NOTE — Assessment & Plan Note (Signed)
BP has been running high.  He has not been able to tolerate beta blockers in the past.  I will increase diltiazem to 360 mg daily.  We will call him for a BP check in 2 weeks.

## 2010-09-13 NOTE — Assessment & Plan Note (Signed)
Chronic occasional atypical chest pain.  Negative myoview in 12/11.  Doubt this represents ischemia.

## 2010-09-13 NOTE — Assessment & Plan Note (Signed)
Medtronic-Hall mechanical mitral valve.  Continue coumadin and ASA 81 mg daily.  Normal valve function on last echo in 8/10. Will get repeat echo to follow valve next year.

## 2010-09-13 NOTE — Assessment & Plan Note (Signed)
Stable on coumadin.  HR is a bit high resting (in the 90s).  Given elevated BP, I will increase diltiazem to 360 mg daily.

## 2010-09-20 ENCOUNTER — Telehealth: Payer: Self-pay | Admitting: *Deleted

## 2010-09-20 NOTE — Telephone Encounter (Signed)
Pt left VM - need to call pt back - he would like to give [place] update w/BP readings.

## 2010-09-21 NOTE — Telephone Encounter (Signed)
Spoke w/pt - he was seen by Dr Shirlee Latch who increased diltiazem to 360 mg. BP has improved.   Recent readings: 137/66 136/66 143/66 137/68 135/64 Pulse is in the 70's and upper 60's  Pt will f/u with Dr Shirlee Latch w/BP readings also.

## 2010-09-21 NOTE — Telephone Encounter (Signed)
Results noted: great!!

## 2010-09-22 ENCOUNTER — Telehealth: Payer: Self-pay | Admitting: *Deleted

## 2010-09-22 DIAGNOSIS — Z952 Presence of prosthetic heart valve: Secondary | ICD-10-CM

## 2010-09-22 DIAGNOSIS — I4891 Unspecified atrial fibrillation: Secondary | ICD-10-CM

## 2010-09-22 MED ORDER — DILTIAZEM HCL ER COATED BEADS 360 MG PO CP24: 360.0000 mg | ORAL_CAPSULE | Freq: Every day | ORAL | Status: DC

## 2010-09-22 NOTE — Telephone Encounter (Signed)
HYPERTENSION - [name], MD 09/13/2010 5:08 PM Signed  BP has been running high. He has not been able to tolerate beta blockers in the past. I will increase diltiazem to 360 mg daily. We will call him for a BP check in 2 weeks.    6//29/12 I talked with pt by telephone.  Pt started increased dose of Diltiazem 360mg  09/13/10. BP readings since increasing Diltiazem:  09/13/10 139/69 74 09/14/10 137/83 84  126/60  75 09/15/10  132/65 69 09/16/10 131/66 73  137/66  71 09/18/10  136/66 69  143/66 66  145/69  68 09/19/10  137/68 68 09/21/10  135/64  62  Pt feeling good. I will forward to Dr Shirlee Latch for review.

## 2010-09-22 NOTE — Telephone Encounter (Signed)
LMCTB

## 2010-09-22 NOTE — Telephone Encounter (Signed)
BP better 

## 2010-09-22 NOTE — Telephone Encounter (Signed)
I talked with pt. 

## 2010-09-22 NOTE — Telephone Encounter (Signed)
Pt rtn call 

## 2010-10-06 ENCOUNTER — Ambulatory Visit (INDEPENDENT_AMBULATORY_CARE_PROVIDER_SITE_OTHER): Payer: Medicare Other | Admitting: *Deleted

## 2010-10-06 DIAGNOSIS — Z9889 Other specified postprocedural states: Secondary | ICD-10-CM

## 2010-10-06 DIAGNOSIS — G459 Transient cerebral ischemic attack, unspecified: Secondary | ICD-10-CM

## 2010-10-06 DIAGNOSIS — I059 Rheumatic mitral valve disease, unspecified: Secondary | ICD-10-CM

## 2010-10-06 DIAGNOSIS — I4891 Unspecified atrial fibrillation: Secondary | ICD-10-CM

## 2010-10-06 DIAGNOSIS — I6350 Cerebral infarction due to unspecified occlusion or stenosis of unspecified cerebral artery: Secondary | ICD-10-CM

## 2010-10-06 LAB — POCT INR: INR: 3.3

## 2010-11-03 ENCOUNTER — Encounter: Payer: Medicare Other | Admitting: *Deleted

## 2010-11-06 ENCOUNTER — Encounter (INDEPENDENT_AMBULATORY_CARE_PROVIDER_SITE_OTHER): Payer: Medicare Other | Admitting: *Deleted

## 2010-11-06 DIAGNOSIS — I059 Rheumatic mitral valve disease, unspecified: Secondary | ICD-10-CM

## 2010-11-06 DIAGNOSIS — I4891 Unspecified atrial fibrillation: Secondary | ICD-10-CM

## 2010-11-06 DIAGNOSIS — I6350 Cerebral infarction due to unspecified occlusion or stenosis of unspecified cerebral artery: Secondary | ICD-10-CM

## 2010-11-06 DIAGNOSIS — G459 Transient cerebral ischemic attack, unspecified: Secondary | ICD-10-CM

## 2010-11-06 DIAGNOSIS — Z9889 Other specified postprocedural states: Secondary | ICD-10-CM

## 2010-11-09 ENCOUNTER — Other Ambulatory Visit: Payer: Self-pay | Admitting: Cardiology

## 2010-11-09 NOTE — Telephone Encounter (Signed)
3 REFILL. 90 DAY SUPPLY.

## 2010-11-10 MED ORDER — DIPYRIDAMOLE 50 MG PO TABS: 50.0000 mg | ORAL_TABLET | Freq: Three times a day (TID) | ORAL | Status: DC

## 2010-12-01 ENCOUNTER — Telehealth: Payer: Self-pay | Admitting: Cardiology

## 2010-12-01 NOTE — Telephone Encounter (Signed)
Pharmacy calling regarding diltiazem. Please return call to discuss further.  Ref # [id number]

## 2010-12-04 ENCOUNTER — Ambulatory Visit (INDEPENDENT_AMBULATORY_CARE_PROVIDER_SITE_OTHER): Payer: Medicare Other | Admitting: *Deleted

## 2010-12-04 DIAGNOSIS — G459 Transient cerebral ischemic attack, unspecified: Secondary | ICD-10-CM

## 2010-12-04 DIAGNOSIS — I4891 Unspecified atrial fibrillation: Secondary | ICD-10-CM

## 2010-12-04 DIAGNOSIS — Z9889 Other specified postprocedural states: Secondary | ICD-10-CM

## 2010-12-04 DIAGNOSIS — I059 Rheumatic mitral valve disease, unspecified: Secondary | ICD-10-CM

## 2010-12-04 DIAGNOSIS — I6350 Cerebral infarction due to unspecified occlusion or stenosis of unspecified cerebral artery: Secondary | ICD-10-CM

## 2010-12-25 ENCOUNTER — Other Ambulatory Visit: Payer: Self-pay | Admitting: Cardiology

## 2010-12-25 MED ORDER — WARFARIN SODIUM 5 MG PO TABS: ORAL_TABLET | ORAL | Status: DC

## 2011-01-01 ENCOUNTER — Ambulatory Visit (INDEPENDENT_AMBULATORY_CARE_PROVIDER_SITE_OTHER): Payer: Medicare Other | Admitting: *Deleted

## 2011-01-01 DIAGNOSIS — G459 Transient cerebral ischemic attack, unspecified: Secondary | ICD-10-CM

## 2011-01-01 DIAGNOSIS — I059 Rheumatic mitral valve disease, unspecified: Secondary | ICD-10-CM

## 2011-01-01 DIAGNOSIS — I4891 Unspecified atrial fibrillation: Secondary | ICD-10-CM

## 2011-01-01 DIAGNOSIS — Z9889 Other specified postprocedural states: Secondary | ICD-10-CM

## 2011-01-01 DIAGNOSIS — I6350 Cerebral infarction due to unspecified occlusion or stenosis of unspecified cerebral artery: Secondary | ICD-10-CM

## 2011-01-01 LAB — POCT INR: INR: 3.1

## 2011-01-08 ENCOUNTER — Ambulatory Visit (INDEPENDENT_AMBULATORY_CARE_PROVIDER_SITE_OTHER): Payer: Medicare Other | Admitting: *Deleted

## 2011-01-08 DIAGNOSIS — Z23 Encounter for immunization: Secondary | ICD-10-CM

## 2011-01-09 ENCOUNTER — Ambulatory Visit (INDEPENDENT_AMBULATORY_CARE_PROVIDER_SITE_OTHER): Payer: Medicare Other | Admitting: Internal Medicine

## 2011-01-09 DIAGNOSIS — I4891 Unspecified atrial fibrillation: Secondary | ICD-10-CM

## 2011-01-09 DIAGNOSIS — I10 Essential (primary) hypertension: Secondary | ICD-10-CM

## 2011-01-09 MED ORDER — AMLODIPINE BESYLATE 10 MG PO TABS: 10.0000 mg | ORAL_TABLET | Freq: Every day | ORAL | Status: DC

## 2011-01-12 NOTE — Assessment & Plan Note (Signed)
BP Readings from Last 3 Encounters:  01/09/11 146/82  09/12/10 170/80  06/27/10 168/70   Patient with white coat phenomena but he is compulsive about home monitoring and is usually well controlled. He attributes a decrease in control to medication change. Cardiology notes were reviewed. He does remain asymptomatic  Plan - resume use of amlodipine 10 mg qd.

## 2011-01-12 NOTE — Assessment & Plan Note (Signed)
Patient's last cardiology notes reviewed. He generally has good rate control. He has seen no difference on diltiazem.  Plan - resume amlodipine - see BP note.

## 2011-01-12 NOTE — Progress Notes (Signed)
  Subjective:    Patient ID: Anthony Gould, male    DOB: Jun 24, 1933, 77 y.o.   MRN: [id number]  HPI Anthony Gould has been tracking his BP which has been consistently higher with SBP 140's since stopping norvasc and taking diltiazem that was started for better rate control. He has had good rate control and does see any difference with the diltiazem. Cardiology had made the medication change due to a concern for cardiac output that may be compromised by increased rate. By Anthony Gould's report his heart rate is usually better controlled.[name] He is otherwise doing OK.  I have reviewed the patient's medical history in detail and updated the computerized patient record.    Review of Systems System review is negative for any constitutional, cardiac, pulmonary, GI or neuro symptoms or complaints other than as described in the HPI.     Objective:   Physical Exam Vitals noted with BP running a little high. Gen'l - WNWD white man in no distress Cor - IRIR but rate controlled.       Assessment & Plan:

## 2011-01-26 ENCOUNTER — Other Ambulatory Visit: Payer: Self-pay | Admitting: Internal Medicine

## 2011-01-29 ENCOUNTER — Ambulatory Visit (INDEPENDENT_AMBULATORY_CARE_PROVIDER_SITE_OTHER): Payer: Medicare Other | Admitting: *Deleted

## 2011-01-29 DIAGNOSIS — I4891 Unspecified atrial fibrillation: Secondary | ICD-10-CM

## 2011-01-29 DIAGNOSIS — G459 Transient cerebral ischemic attack, unspecified: Secondary | ICD-10-CM

## 2011-01-29 DIAGNOSIS — Z9889 Other specified postprocedural states: Secondary | ICD-10-CM

## 2011-01-29 DIAGNOSIS — I6350 Cerebral infarction due to unspecified occlusion or stenosis of unspecified cerebral artery: Secondary | ICD-10-CM

## 2011-01-29 DIAGNOSIS — I059 Rheumatic mitral valve disease, unspecified: Secondary | ICD-10-CM

## 2011-01-29 DIAGNOSIS — Z7901 Long term (current) use of anticoagulants: Secondary | ICD-10-CM

## 2011-02-18 ENCOUNTER — Other Ambulatory Visit: Payer: Self-pay | Admitting: Internal Medicine

## 2011-02-18 ENCOUNTER — Encounter: Payer: Self-pay | Admitting: Internal Medicine

## 2011-03-08 ENCOUNTER — Encounter: Payer: Medicare Other | Admitting: *Deleted

## 2011-03-09 ENCOUNTER — Encounter: Payer: Medicare Other | Admitting: *Deleted

## 2011-03-12 ENCOUNTER — Ambulatory Visit (INDEPENDENT_AMBULATORY_CARE_PROVIDER_SITE_OTHER): Payer: Medicare Other | Admitting: *Deleted

## 2011-03-12 DIAGNOSIS — G459 Transient cerebral ischemic attack, unspecified: Secondary | ICD-10-CM

## 2011-03-12 DIAGNOSIS — Z7901 Long term (current) use of anticoagulants: Secondary | ICD-10-CM

## 2011-03-12 DIAGNOSIS — I059 Rheumatic mitral valve disease, unspecified: Secondary | ICD-10-CM

## 2011-03-12 DIAGNOSIS — I6350 Cerebral infarction due to unspecified occlusion or stenosis of unspecified cerebral artery: Secondary | ICD-10-CM

## 2011-03-12 DIAGNOSIS — I4891 Unspecified atrial fibrillation: Secondary | ICD-10-CM

## 2011-03-12 DIAGNOSIS — Z9889 Other specified postprocedural states: Secondary | ICD-10-CM

## 2011-04-23 ENCOUNTER — Encounter: Payer: Medicare Other | Admitting: *Deleted

## 2011-04-25 ENCOUNTER — Ambulatory Visit (INDEPENDENT_AMBULATORY_CARE_PROVIDER_SITE_OTHER): Payer: Medicare Other | Admitting: *Deleted

## 2011-04-25 DIAGNOSIS — Z9889 Other specified postprocedural states: Secondary | ICD-10-CM

## 2011-04-25 DIAGNOSIS — I059 Rheumatic mitral valve disease, unspecified: Secondary | ICD-10-CM

## 2011-04-25 DIAGNOSIS — I6350 Cerebral infarction due to unspecified occlusion or stenosis of unspecified cerebral artery: Secondary | ICD-10-CM

## 2011-04-25 DIAGNOSIS — I4891 Unspecified atrial fibrillation: Secondary | ICD-10-CM

## 2011-04-25 DIAGNOSIS — Z7901 Long term (current) use of anticoagulants: Secondary | ICD-10-CM

## 2011-04-25 DIAGNOSIS — G459 Transient cerebral ischemic attack, unspecified: Secondary | ICD-10-CM

## 2011-06-06 ENCOUNTER — Ambulatory Visit (INDEPENDENT_AMBULATORY_CARE_PROVIDER_SITE_OTHER): Payer: Medicare Other | Admitting: *Deleted

## 2011-06-06 DIAGNOSIS — Z7901 Long term (current) use of anticoagulants: Secondary | ICD-10-CM

## 2011-06-06 DIAGNOSIS — G459 Transient cerebral ischemic attack, unspecified: Secondary | ICD-10-CM

## 2011-06-06 DIAGNOSIS — I6350 Cerebral infarction due to unspecified occlusion or stenosis of unspecified cerebral artery: Secondary | ICD-10-CM

## 2011-06-06 DIAGNOSIS — Z9889 Other specified postprocedural states: Secondary | ICD-10-CM

## 2011-06-06 DIAGNOSIS — I4891 Unspecified atrial fibrillation: Secondary | ICD-10-CM

## 2011-06-06 DIAGNOSIS — I059 Rheumatic mitral valve disease, unspecified: Secondary | ICD-10-CM

## 2011-06-06 LAB — POCT INR: INR: 3.4

## 2011-07-02 ENCOUNTER — Other Ambulatory Visit: Payer: Self-pay

## 2011-07-02 MED ORDER — FUROSEMIDE 40 MG PO TABS: 40.0000 mg | ORAL_TABLET | Freq: Every day | ORAL | Status: DC

## 2011-07-03 ENCOUNTER — Other Ambulatory Visit: Payer: Self-pay | Admitting: *Deleted

## 2011-07-03 MED ORDER — DIPYRIDAMOLE 50 MG PO TABS: 50.0000 mg | ORAL_TABLET | Freq: Three times a day (TID) | ORAL | Status: DC

## 2011-07-18 ENCOUNTER — Ambulatory Visit (INDEPENDENT_AMBULATORY_CARE_PROVIDER_SITE_OTHER): Payer: Medicare Other | Admitting: Pharmacist

## 2011-07-18 DIAGNOSIS — Z9889 Other specified postprocedural states: Secondary | ICD-10-CM

## 2011-07-18 DIAGNOSIS — G459 Transient cerebral ischemic attack, unspecified: Secondary | ICD-10-CM

## 2011-07-18 DIAGNOSIS — I4891 Unspecified atrial fibrillation: Secondary | ICD-10-CM

## 2011-07-18 DIAGNOSIS — I6350 Cerebral infarction due to unspecified occlusion or stenosis of unspecified cerebral artery: Secondary | ICD-10-CM

## 2011-07-18 DIAGNOSIS — Z7901 Long term (current) use of anticoagulants: Secondary | ICD-10-CM

## 2011-07-18 DIAGNOSIS — I059 Rheumatic mitral valve disease, unspecified: Secondary | ICD-10-CM

## 2011-08-10 ENCOUNTER — Ambulatory Visit (INDEPENDENT_AMBULATORY_CARE_PROVIDER_SITE_OTHER): Payer: Medicare Other | Admitting: *Deleted

## 2011-08-10 DIAGNOSIS — I059 Rheumatic mitral valve disease, unspecified: Secondary | ICD-10-CM

## 2011-08-10 DIAGNOSIS — G459 Transient cerebral ischemic attack, unspecified: Secondary | ICD-10-CM

## 2011-08-10 DIAGNOSIS — I6350 Cerebral infarction due to unspecified occlusion or stenosis of unspecified cerebral artery: Secondary | ICD-10-CM

## 2011-08-10 DIAGNOSIS — Z7901 Long term (current) use of anticoagulants: Secondary | ICD-10-CM

## 2011-08-10 DIAGNOSIS — Z9889 Other specified postprocedural states: Secondary | ICD-10-CM

## 2011-08-10 DIAGNOSIS — I4891 Unspecified atrial fibrillation: Secondary | ICD-10-CM

## 2011-08-29 ENCOUNTER — Ambulatory Visit (INDEPENDENT_AMBULATORY_CARE_PROVIDER_SITE_OTHER): Payer: Medicare Other | Admitting: *Deleted

## 2011-08-29 ENCOUNTER — Ambulatory Visit (HOSPITAL_COMMUNITY): Payer: Medicare Other | Attending: Cardiovascular Disease | Admitting: Radiology

## 2011-08-29 DIAGNOSIS — I4891 Unspecified atrial fibrillation: Secondary | ICD-10-CM

## 2011-08-29 DIAGNOSIS — I6350 Cerebral infarction due to unspecified occlusion or stenosis of unspecified cerebral artery: Secondary | ICD-10-CM

## 2011-08-29 DIAGNOSIS — E119 Type 2 diabetes mellitus without complications: Secondary | ICD-10-CM | POA: Insufficient documentation

## 2011-08-29 DIAGNOSIS — G459 Transient cerebral ischemic attack, unspecified: Secondary | ICD-10-CM

## 2011-08-29 DIAGNOSIS — Z8673 Personal history of transient ischemic attack (TIA), and cerebral infarction without residual deficits: Secondary | ICD-10-CM | POA: Insufficient documentation

## 2011-08-29 DIAGNOSIS — I059 Rheumatic mitral valve disease, unspecified: Secondary | ICD-10-CM

## 2011-08-29 DIAGNOSIS — R011 Cardiac murmur, unspecified: Secondary | ICD-10-CM | POA: Insufficient documentation

## 2011-08-29 DIAGNOSIS — I2510 Atherosclerotic heart disease of native coronary artery without angina pectoris: Secondary | ICD-10-CM | POA: Insufficient documentation

## 2011-08-29 DIAGNOSIS — Z952 Presence of prosthetic heart valve: Secondary | ICD-10-CM

## 2011-08-29 DIAGNOSIS — I10 Essential (primary) hypertension: Secondary | ICD-10-CM | POA: Insufficient documentation

## 2011-08-29 DIAGNOSIS — Z9889 Other specified postprocedural states: Secondary | ICD-10-CM

## 2011-08-29 DIAGNOSIS — Z7901 Long term (current) use of anticoagulants: Secondary | ICD-10-CM

## 2011-08-29 DIAGNOSIS — E785 Hyperlipidemia, unspecified: Secondary | ICD-10-CM | POA: Insufficient documentation

## 2011-08-29 DIAGNOSIS — I517 Cardiomegaly: Secondary | ICD-10-CM | POA: Insufficient documentation

## 2011-08-29 LAB — POCT INR: INR: 3.2

## 2011-08-29 NOTE — Progress Notes (Signed)
Echocardiogram performed.  

## 2011-09-05 ENCOUNTER — Other Ambulatory Visit: Payer: Self-pay | Admitting: Cardiology

## 2011-09-05 ENCOUNTER — Encounter: Payer: Self-pay | Admitting: Cardiology

## 2011-09-05 ENCOUNTER — Ambulatory Visit (INDEPENDENT_AMBULATORY_CARE_PROVIDER_SITE_OTHER): Payer: Medicare Other | Admitting: Cardiology

## 2011-09-05 VITALS — BP 162/70 | HR 73 | Ht 70.0 in | Wt 191.0 lb

## 2011-09-05 DIAGNOSIS — I509 Heart failure, unspecified: Secondary | ICD-10-CM

## 2011-09-05 DIAGNOSIS — E785 Hyperlipidemia, unspecified: Secondary | ICD-10-CM

## 2011-09-05 DIAGNOSIS — I5032 Chronic diastolic (congestive) heart failure: Secondary | ICD-10-CM

## 2011-09-05 DIAGNOSIS — G459 Transient cerebral ischemic attack, unspecified: Secondary | ICD-10-CM

## 2011-09-05 DIAGNOSIS — I059 Rheumatic mitral valve disease, unspecified: Secondary | ICD-10-CM

## 2011-09-05 DIAGNOSIS — I4891 Unspecified atrial fibrillation: Secondary | ICD-10-CM

## 2011-09-05 DIAGNOSIS — I10 Essential (primary) hypertension: Secondary | ICD-10-CM

## 2011-09-05 DIAGNOSIS — I2510 Atherosclerotic heart disease of native coronary artery without angina pectoris: Secondary | ICD-10-CM

## 2011-09-05 MED ORDER — WARFARIN SODIUM 5 MG PO TABS: ORAL_TABLET | ORAL | Status: DC

## 2011-09-05 NOTE — Patient Instructions (Addendum)
Your physician recommends that you return for a FASTING lipid profile /liver profile/TSH/Free T4/Uric Acid/Hgb A1C/CBCd/Magnesium/BMET. This is scheduled for Wednesday June 19,2013 at the [facility] lab.  Dr Shirlee Latch recommends that you follow a low sodium diet. Try to limit your sodium intake to 1500-2000mg  daily.  Your physician wants you to follow-up in: 1 year with Dr Shirlee Latch. (June 2014). You will receive a reminder letter in the mail two months in advance. If you don't receive a letter, please call our office to schedule the follow-up appointment.

## 2011-09-06 DIAGNOSIS — I480 Paroxysmal atrial fibrillation: Secondary | ICD-10-CM | POA: Insufficient documentation

## 2011-09-06 NOTE — Assessment & Plan Note (Signed)
Medtronic-Hall mechanical mitral valve.  Continue coumadin and ASA 81 mg daily.  Normal valve function on last echo earlier this month.

## 2011-09-06 NOTE — Progress Notes (Signed)
Patient ID: Anthony Gould, male   DOB: 10/02/1933, 78 y.o.   MRN: [id number] PCP: Dr. Debby Bud  78 yo with history of mechanical mitral valve replacement for severe mitral regurgitation and permanent atrial fibrillation presents for cardiology followup.  Patient has a history of possible TIAs for which he has has been on aspirin and dipyridamole long-term.  Patient had an ETT-myoview in 12/11 with no ischemia or infarction.    Symptomatically, Anthony Gould has been doing well.  He golfs once a week.  He denies chest pain or exertional dyspnea.  He can climb a flight of steps without problems.  No recent TIA-like symptoms.  He does have some ankle edema that has been chronic.  Echo was done this month showing EF 50-55%, mildly dilated LV, normally functioning mechanical aortic valve, and PA systolic pressure 38 mmHg.    BP his high today in the office.  He checks it every day or two at home and it consistently runs in the 130s systolic.   Labs (4/12): LDL 70, HDL 59, K 4.9, creatinine 1.0, TSH 6.5 (elevated), LFTs normal  PMH: 1. Atrial fibrillation: Permanent, on coumadin.  He has not been able to tolerate beta blockers due to severe fatigue.  2. DM2: diet-controlled 3. HTN 4. Hyperlipidemia 5. 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  Patient was started on dipyridamole years ago for this.   6. 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  Echo (6/13) with EF 50-55%, mild LVH, mildly dilated LV, mechanical mitral valve functioning normally, biatrial enlargement, PA systolic pressure 38 mmHg.  7. ETT-myoview (12/11): EF 46%, no evidence for ischemia or infarction.  8. Diastolic CHF.   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   FH: Father with MI at 64.  Mother with CVA.   ROS: All systems reviewed and negative except as per HPI.    Current Outpatient Prescriptions  Medication Sig Dispense Refill  .  allopurinol (ZYLOPRIM) 300 MG tablet TAKE 1 TABLET DAILY  90 tablet  10  . aspirin 81 MG EC tablet Take 81 mg by mouth daily.        [name] atorvastatin (LIPITOR) 20 MG tablet TAKE 1 TABLET DAILY  90 tablet  10  . diltiazem (CARDIZEM CD) 360 MG 24 hr capsule Take 1 tablet by mouth Daily.      [name] dipyridamole (PERSANTINE) 50 MG tablet Take 1 tablet (50 mg total) by mouth 3 (three) times daily.  270 tablet  1  . enalapril (VASOTEC) 20 MG tablet TAKE 1 TABLET TWICE A DAY  180 tablet  10  . furosemide (LASIX) 40 MG tablet Take 1 tablet (40 mg total) by mouth daily.  14 tablet  1  . magnesium oxide (MAG-OX) 400 MG tablet Take 400 mg by mouth 3 (three) times daily.        [name] NEXIUM 40 MG capsule TAKE 1 CAPSULE DAILY AS NEEDED  90 capsule  10  . warfarin (COUMADIN) 5 MG tablet Take as directed by the Anticoagulation Clinic  135 tablet  1    BP 162/70  Pulse 73  Ht 5\' 10"  (1.778 m)  Wt 86.637 kg (191 lb)  BMI 27.41 kg/m2  SpO2 97% General: NAD Neck: JVP 8-9 cm, no thyromegaly or thyroid nodule.  Lungs: Clear to auscultation bilaterally with normal respiratory effort. CV: Nondisplaced PMI.  Heart regular S1/S2, mechanical S1, no S3/S4, 1/6 systolic murmur along the sternal  border.  1+ bilateral ankle edema.  No carotid bruit.  Normal pedal pulses.  Abdomen: Soft, nontender, no hepatosplenomegaly, no distention.  Neurologic: Alert and oriented x 3.  Psych: Normal affect. Extremities: No clubbing or cyanosis.

## 2011-09-06 NOTE — Assessment & Plan Note (Signed)
Rare atypical chest pain.  Negative myoview in 12/11.  Doubt this represents ischemia.

## 2011-09-06 NOTE — Assessment & Plan Note (Signed)
Patient does appear mildlly volume overloaded on exam but really has no CHF symptoms.  I asked him to limit his sodium intake.  If he were to develop any new dyspnea, I would increase his Lasix. For now, continue 40 mg daily with BMET today.

## 2011-09-06 NOTE — Assessment & Plan Note (Signed)
BP elevated here but his readings from home have been consistently under good control.  Continue current meds.

## 2011-09-06 NOTE — Assessment & Plan Note (Signed)
Due for lipids check.

## 2011-09-06 NOTE — Assessment & Plan Note (Signed)
Stable on coumadin.  Good rate control.

## 2011-09-06 NOTE — Assessment & Plan Note (Signed)
History of TIAs while on coumadin.  Has been on dipyridamole long-term with no recurrent TIA symptoms.

## 2011-09-10 ENCOUNTER — Other Ambulatory Visit: Payer: Self-pay | Admitting: *Deleted

## 2011-09-10 MED ORDER — DILTIAZEM HCL ER COATED BEADS 360 MG PO CP24: 360.0000 mg | ORAL_CAPSULE | Freq: Every day | ORAL | Status: DC

## 2011-09-10 NOTE — Telephone Encounter (Signed)
Refilled Diltiazem. 

## 2011-09-12 ENCOUNTER — Other Ambulatory Visit (INDEPENDENT_AMBULATORY_CARE_PROVIDER_SITE_OTHER): Payer: Medicare Other

## 2011-09-12 DIAGNOSIS — Z79899 Other long term (current) drug therapy: Secondary | ICD-10-CM

## 2011-09-12 DIAGNOSIS — I10 Essential (primary) hypertension: Secondary | ICD-10-CM

## 2011-09-12 DIAGNOSIS — I059 Rheumatic mitral valve disease, unspecified: Secondary | ICD-10-CM

## 2011-09-12 DIAGNOSIS — I4891 Unspecified atrial fibrillation: Secondary | ICD-10-CM

## 2011-09-12 LAB — BASIC METABOLIC PANEL
CO2: 28 mEq/L (ref 19–32)
Calcium: 9.2 mg/dL (ref 8.4–10.5)
Creatinine, Ser: 1 mg/dL (ref 0.4–1.5)
Glucose, Bld: 127 mg/dL — ABNORMAL HIGH (ref 70–99)

## 2011-09-12 LAB — CBC WITH DIFFERENTIAL/PLATELET
Basophils Relative: 0.9 % (ref 0.0–3.0)
Eosinophils Relative: 7.7 % — ABNORMAL HIGH (ref 0.0–5.0)
MCV: 99.5 fl (ref 78.0–100.0)
Monocytes Absolute: 0.6 10*3/uL (ref 0.1–1.0)
Monocytes Relative: 7.9 % (ref 3.0–12.0)
Neutrophils Relative %: 64.8 % (ref 43.0–77.0)
RBC: 4.47 Mil/uL (ref 4.22–5.81)
WBC: 8.1 10*3/uL (ref 4.5–10.5)

## 2011-09-12 LAB — HEPATIC FUNCTION PANEL
ALT: 26 U/L (ref 0–53)
AST: 32 U/L (ref 0–37)
Albumin: 4.1 g/dL (ref 3.5–5.2)
Alkaline Phosphatase: 106 U/L (ref 39–117)
Total Protein: 7.3 g/dL (ref 6.0–8.3)

## 2011-09-12 LAB — LIPID PANEL
Cholesterol: 126 mg/dL (ref 0–200)
HDL: 50.8 mg/dL (ref >39.00)
Total CHOL/HDL Ratio: 2
Triglycerides: 76 mg/dL (ref 0.0–149.0)

## 2011-09-12 LAB — URIC ACID: Uric Acid, Serum: 4.3 mg/dL (ref 4.0–7.8)

## 2011-09-26 ENCOUNTER — Ambulatory Visit (INDEPENDENT_AMBULATORY_CARE_PROVIDER_SITE_OTHER): Payer: Medicare Other | Admitting: Pharmacist

## 2011-09-26 DIAGNOSIS — I059 Rheumatic mitral valve disease, unspecified: Secondary | ICD-10-CM

## 2011-09-26 DIAGNOSIS — G459 Transient cerebral ischemic attack, unspecified: Secondary | ICD-10-CM

## 2011-09-26 DIAGNOSIS — Z9889 Other specified postprocedural states: Secondary | ICD-10-CM

## 2011-09-26 DIAGNOSIS — I4891 Unspecified atrial fibrillation: Secondary | ICD-10-CM

## 2011-09-26 DIAGNOSIS — I6350 Cerebral infarction due to unspecified occlusion or stenosis of unspecified cerebral artery: Secondary | ICD-10-CM

## 2011-09-26 DIAGNOSIS — Z7901 Long term (current) use of anticoagulants: Secondary | ICD-10-CM

## 2011-10-01 ENCOUNTER — Other Ambulatory Visit: Payer: Self-pay | Admitting: Cardiology

## 2011-10-01 MED ORDER — DIPYRIDAMOLE 50 MG PO TABS: 50.0000 mg | ORAL_TABLET | Freq: Three times a day (TID) | ORAL | Status: DC

## 2011-10-24 ENCOUNTER — Ambulatory Visit (INDEPENDENT_AMBULATORY_CARE_PROVIDER_SITE_OTHER): Payer: Medicare Other | Admitting: *Deleted

## 2011-10-24 DIAGNOSIS — Z7901 Long term (current) use of anticoagulants: Secondary | ICD-10-CM

## 2011-10-24 DIAGNOSIS — I6350 Cerebral infarction due to unspecified occlusion or stenosis of unspecified cerebral artery: Secondary | ICD-10-CM

## 2011-10-24 DIAGNOSIS — Z9889 Other specified postprocedural states: Secondary | ICD-10-CM

## 2011-10-24 DIAGNOSIS — I4891 Unspecified atrial fibrillation: Secondary | ICD-10-CM

## 2011-10-24 DIAGNOSIS — G459 Transient cerebral ischemic attack, unspecified: Secondary | ICD-10-CM

## 2011-10-24 DIAGNOSIS — I059 Rheumatic mitral valve disease, unspecified: Secondary | ICD-10-CM

## 2011-10-24 LAB — POCT INR: INR: 3.3

## 2011-11-21 ENCOUNTER — Ambulatory Visit (INDEPENDENT_AMBULATORY_CARE_PROVIDER_SITE_OTHER): Payer: Medicare Other | Admitting: *Deleted

## 2011-11-21 DIAGNOSIS — I059 Rheumatic mitral valve disease, unspecified: Secondary | ICD-10-CM

## 2011-11-21 DIAGNOSIS — I6350 Cerebral infarction due to unspecified occlusion or stenosis of unspecified cerebral artery: Secondary | ICD-10-CM

## 2011-11-21 DIAGNOSIS — Z7901 Long term (current) use of anticoagulants: Secondary | ICD-10-CM

## 2011-11-21 DIAGNOSIS — Z9889 Other specified postprocedural states: Secondary | ICD-10-CM

## 2011-11-21 DIAGNOSIS — I4891 Unspecified atrial fibrillation: Secondary | ICD-10-CM

## 2011-11-21 DIAGNOSIS — G459 Transient cerebral ischemic attack, unspecified: Secondary | ICD-10-CM

## 2011-11-21 LAB — POCT INR: INR: 3.4

## 2011-12-17 ENCOUNTER — Other Ambulatory Visit: Payer: Self-pay | Admitting: *Deleted

## 2011-12-17 MED ORDER — FUROSEMIDE 40 MG PO TABS: 40.0000 mg | ORAL_TABLET | Freq: Every day | ORAL | Status: DC

## 2011-12-19 ENCOUNTER — Ambulatory Visit (INDEPENDENT_AMBULATORY_CARE_PROVIDER_SITE_OTHER): Payer: Medicare Other | Admitting: *Deleted

## 2011-12-19 DIAGNOSIS — G459 Transient cerebral ischemic attack, unspecified: Secondary | ICD-10-CM

## 2011-12-19 DIAGNOSIS — Z7901 Long term (current) use of anticoagulants: Secondary | ICD-10-CM

## 2011-12-19 DIAGNOSIS — I4891 Unspecified atrial fibrillation: Secondary | ICD-10-CM

## 2011-12-19 DIAGNOSIS — I6350 Cerebral infarction due to unspecified occlusion or stenosis of unspecified cerebral artery: Secondary | ICD-10-CM

## 2011-12-19 DIAGNOSIS — I059 Rheumatic mitral valve disease, unspecified: Secondary | ICD-10-CM

## 2011-12-19 DIAGNOSIS — Z9889 Other specified postprocedural states: Secondary | ICD-10-CM

## 2012-01-09 ENCOUNTER — Ambulatory Visit (INDEPENDENT_AMBULATORY_CARE_PROVIDER_SITE_OTHER): Payer: Medicare Other | Admitting: *Deleted

## 2012-01-09 DIAGNOSIS — Z7901 Long term (current) use of anticoagulants: Secondary | ICD-10-CM

## 2012-01-09 DIAGNOSIS — Z9889 Other specified postprocedural states: Secondary | ICD-10-CM

## 2012-01-09 DIAGNOSIS — G459 Transient cerebral ischemic attack, unspecified: Secondary | ICD-10-CM

## 2012-01-09 DIAGNOSIS — I6350 Cerebral infarction due to unspecified occlusion or stenosis of unspecified cerebral artery: Secondary | ICD-10-CM

## 2012-01-09 DIAGNOSIS — I4891 Unspecified atrial fibrillation: Secondary | ICD-10-CM

## 2012-01-09 DIAGNOSIS — I059 Rheumatic mitral valve disease, unspecified: Secondary | ICD-10-CM

## 2012-01-09 LAB — POCT INR: INR: 2.9

## 2012-01-10 ENCOUNTER — Ambulatory Visit: Payer: Medicare Other

## 2012-01-23 ENCOUNTER — Ambulatory Visit (INDEPENDENT_AMBULATORY_CARE_PROVIDER_SITE_OTHER): Payer: Medicare Other | Admitting: General Practice

## 2012-01-23 DIAGNOSIS — Z23 Encounter for immunization: Secondary | ICD-10-CM

## 2012-01-28 ENCOUNTER — Other Ambulatory Visit: Payer: Self-pay | Admitting: *Deleted

## 2012-01-28 MED ORDER — ALLOPURINOL 300 MG PO TABS: 300.0000 mg | ORAL_TABLET | Freq: Every day | ORAL | Status: DC

## 2012-01-28 NOTE — Telephone Encounter (Signed)
REFILL ON ALLOPURINOL SENT TO [facility]. PER PATIENT REQUEST TILL HAS VISIT WITH DOCTOR .

## 2012-02-06 ENCOUNTER — Encounter: Payer: Self-pay | Admitting: Internal Medicine

## 2012-02-06 ENCOUNTER — Other Ambulatory Visit: Payer: Self-pay | Admitting: *Deleted

## 2012-02-06 ENCOUNTER — Ambulatory Visit (INDEPENDENT_AMBULATORY_CARE_PROVIDER_SITE_OTHER): Payer: Medicare Other | Admitting: *Deleted

## 2012-02-06 ENCOUNTER — Other Ambulatory Visit: Payer: Self-pay | Admitting: Internal Medicine

## 2012-02-06 ENCOUNTER — Ambulatory Visit (INDEPENDENT_AMBULATORY_CARE_PROVIDER_SITE_OTHER): Payer: Medicare Other | Admitting: Internal Medicine

## 2012-02-06 VITALS — BP 140/78 | HR 97 | Temp 97.2°F | Resp 12 | Wt 190.1 lb

## 2012-02-06 DIAGNOSIS — Z7901 Long term (current) use of anticoagulants: Secondary | ICD-10-CM

## 2012-02-06 DIAGNOSIS — Z0000 Encounter for general adult medical examination without abnormal findings: Secondary | ICD-10-CM

## 2012-02-06 DIAGNOSIS — G459 Transient cerebral ischemic attack, unspecified: Secondary | ICD-10-CM

## 2012-02-06 DIAGNOSIS — I059 Rheumatic mitral valve disease, unspecified: Secondary | ICD-10-CM

## 2012-02-06 DIAGNOSIS — E119 Type 2 diabetes mellitus without complications: Secondary | ICD-10-CM

## 2012-02-06 DIAGNOSIS — I4891 Unspecified atrial fibrillation: Secondary | ICD-10-CM

## 2012-02-06 DIAGNOSIS — E785 Hyperlipidemia, unspecified: Secondary | ICD-10-CM

## 2012-02-06 DIAGNOSIS — Z23 Encounter for immunization: Secondary | ICD-10-CM

## 2012-02-06 DIAGNOSIS — I10 Essential (primary) hypertension: Secondary | ICD-10-CM

## 2012-02-06 DIAGNOSIS — I6350 Cerebral infarction due to unspecified occlusion or stenosis of unspecified cerebral artery: Secondary | ICD-10-CM

## 2012-02-06 DIAGNOSIS — M109 Gout, unspecified: Secondary | ICD-10-CM

## 2012-02-06 DIAGNOSIS — Z9889 Other specified postprocedural states: Secondary | ICD-10-CM

## 2012-02-06 MED ORDER — ATORVASTATIN CALCIUM 20 MG PO TABS: 20.0000 mg | ORAL_TABLET | Freq: Every day | ORAL | Status: DC

## 2012-02-06 MED ORDER — ALLOPURINOL 300 MG PO TABS: 300.0000 mg | ORAL_TABLET | Freq: Every day | ORAL | Status: DC

## 2012-02-06 MED ORDER — ENALAPRIL MALEATE 20 MG PO TABS: 20.0000 mg | ORAL_TABLET | Freq: Two times a day (BID) | ORAL | Status: DC

## 2012-02-06 MED ORDER — ESOMEPRAZOLE MAGNESIUM 40 MG PO CPDR: 40.0000 mg | DELAYED_RELEASE_CAPSULE | Freq: Every day | ORAL | Status: DC

## 2012-02-06 NOTE — Assessment & Plan Note (Signed)
No recent flares of gout

## 2012-02-06 NOTE — Telephone Encounter (Signed)
Patient needs his medication sent to [facility] and not to the [facility], he would like a call once that has been completed

## 2012-02-06 NOTE — Assessment & Plan Note (Signed)
BP Readings from Last 3 Encounters:  02/06/12 140/78  09/05/11 162/70  01/09/11 146/82   Home readings reveal majority readings with SBP 130's  Plan - continue present meds.

## 2012-02-06 NOTE — Progress Notes (Signed)
Subjective:    Patient ID: Anthony Gould, male    DOB: 01/24/1934, 78 y.o.   MRN: [id number]  HPI The patient is here for annual Medicare wellness examination and management of other chronic and acute problems.   The risk factors are reflected in the social history.  The roster of all physicians providing medical care to patient - is listed in the Snapshot section of the chart.  Activities of daily living:  The patient is 100% inedpendent in all ADLs: dressing, toileting, feeding as well as independent mobility  Home safety : The patient has smoke detectors in the home. Falls - no falls. Home is fall safe. They wear seatbelts. No firearms at home. There is no violence in the home.   There is no risks for hepatitis, STDs or HIV. There is no history of blood transfusion. They have no travel history to infectious disease endemic areas of the world.  The patient has seen their dentist in the last 12 months. They have not seen their eye doctor in the last year. They deny any hearing difficulty and have not had audiologic testing in the last year.   They do not  have excessive sun exposure. Discussed the need for sun protection: hats, long sleeves and use of sunscreen if there is significant sun exposure.   Diet: the importance of a healthy diet is discussed. They do have a healthy diet.  The patient has no regular exercise program.  The benefits of regular aerobic exercise were discussed.  Depression screen: there are no signs or vegative symptoms of depression- irritability, change in appetite, anhedonia, sadness/tearfullness.  Cognitive assessment: the patient manages all their financial and personal affairs and is actively engaged.   The following portions of the patient's history were reviewed and updated as appropriate: allergies, current medications, past family history, past medical history,  past surgical history, past social history  and problem list.  Vision, hearing, body  mass index were assessed and reviewed.   During the course of the visit the patient was educated and counseled about appropriate screening and preventive services including : fall prevention , diabetes screening, nutrition counseling, colorectal cancer screening, and recommended immunizations.  Current Outpatient Prescriptions on File Prior to Visit  Medication Sig Dispense Refill  . allopurinol (ZYLOPRIM) 300 MG tablet Take 1 tablet (300 mg total) by mouth daily.  30 tablet  0  . aspirin 81 MG EC tablet Take 81 mg by mouth daily.        [name] atorvastatin (LIPITOR) 20 MG tablet TAKE 1 TABLET DAILY  90 tablet  10  . diltiazem (CARDIZEM CD) 360 MG 24 hr capsule Take 1 capsule (360 mg total) by mouth daily.  90 capsule  3  . dipyridamole (PERSANTINE) 50 MG tablet Take 1 tablet (50 mg total) by mouth 3 (three) times daily.  270 tablet  1  . enalapril (VASOTEC) 20 MG tablet TAKE 1 TABLET TWICE A DAY  180 tablet  10  . furosemide (LASIX) 40 MG tablet Take 1 tablet (40 mg total) by mouth daily. Need to make yearly physical appt. With Dr. Debby Bud  For further refills  90 tablet  3  . magnesium oxide (MAG-OX) 400 MG tablet Take 400 mg by mouth 3 (three) times daily.        [name] NEXIUM 40 MG capsule TAKE 1 CAPSULE DAILY AS NEEDED  90 capsule  10  . warfarin (COUMADIN) 5 MG tablet Take as directed by the Anticoagulation Clinic  135 tablet  1     Review of Systems Constitutional:  Negative for fever, chills, activity change and unexpected weight change.  HEENT:  Negative for hearing loss, ear pain, congestion, neck stiffness and postnasal drip. Negative for sore throat or swallowing problems. Negative for dental complaints.   Eyes: Negative for vision loss or change in visual acuity.  Respiratory: Negative for chest tightness and wheezing. Negative for DOE.   Cardiovascular: Negative for chest pain or palpitations. No decreased exercise tolerance Gastrointestinal: No change in bowel habit. No bloating or gas.  No reflux or indigestion Genitourinary: Negative for urgency, frequency, flank pain and difficulty urinating.  Musculoskeletal: Negative for myalgias, back pain, arthralgias and gait problem.  Neurological: Negative for dizziness, tremors, weakness and headaches.  Hematological: Negative for adenopathy.  Psychiatric/Behavioral: Negative for behavioral problems and dysphoric mood.       Objective:   Physical Exam Filed Vitals:   02/06/12 0957  BP: 140/78  Pulse: 97  Temp: 97.2 F (36.2 C)  Resp: 12   Wt Readings from Last 3 Encounters:  02/06/12 190 lb 1.9 oz (86.238 kg)  09/05/11 191 lb (86.637 kg)  01/09/11 190 lb (86.183 kg)   Gen'l- WNWD white man in no distress He declines further physical exam - has been seen and examined by his cardiologist.       Assessment & Plan:  Doing well - medically stable

## 2012-02-06 NOTE — Assessment & Plan Note (Signed)
Lab Results  Component Value Date   HGBA1C 5.7 09/12/2011  `

## 2012-02-06 NOTE — Assessment & Plan Note (Signed)
Stable and feels well. He has had no major illness, injury or surgery in the last year. He is current in regard to labs. He declines colorectal cancer screening.  He has aged out of prostate cancer screening.   In summary - medically stable and doing really well. He will return prn.

## 2012-02-06 NOTE — Assessment & Plan Note (Signed)
Reviewed last labs - excellent control on present dose lipitor. Reviewed side effects: patient read about increased risk of cataracts - ePocrates list no cataract issues under adverse effects.  Plan - continue present meds.

## 2012-02-25 ENCOUNTER — Other Ambulatory Visit: Payer: Self-pay | Admitting: *Deleted

## 2012-02-25 MED ORDER — ALLOPURINOL 300 MG PO TABS: 300.0000 mg | ORAL_TABLET | Freq: Every day | ORAL | Status: DC

## 2012-03-05 ENCOUNTER — Ambulatory Visit (INDEPENDENT_AMBULATORY_CARE_PROVIDER_SITE_OTHER): Payer: Medicare Other | Admitting: *Deleted

## 2012-03-05 DIAGNOSIS — I6350 Cerebral infarction due to unspecified occlusion or stenosis of unspecified cerebral artery: Secondary | ICD-10-CM

## 2012-03-05 DIAGNOSIS — I4891 Unspecified atrial fibrillation: Secondary | ICD-10-CM

## 2012-03-05 DIAGNOSIS — Z7901 Long term (current) use of anticoagulants: Secondary | ICD-10-CM

## 2012-03-05 DIAGNOSIS — Z9889 Other specified postprocedural states: Secondary | ICD-10-CM

## 2012-03-05 DIAGNOSIS — I059 Rheumatic mitral valve disease, unspecified: Secondary | ICD-10-CM

## 2012-03-05 DIAGNOSIS — G459 Transient cerebral ischemic attack, unspecified: Secondary | ICD-10-CM

## 2012-03-05 MED ORDER — WARFARIN SODIUM 5 MG PO TABS: ORAL_TABLET | ORAL | Status: DC

## 2012-03-17 ENCOUNTER — Other Ambulatory Visit: Payer: Self-pay | Admitting: *Deleted

## 2012-03-17 MED ORDER — ALLOPURINOL 300 MG PO TABS: 300.0000 mg | ORAL_TABLET | Freq: Every day | ORAL | Status: DC

## 2012-04-02 ENCOUNTER — Ambulatory Visit (INDEPENDENT_AMBULATORY_CARE_PROVIDER_SITE_OTHER): Payer: Medicare Other | Admitting: *Deleted

## 2012-04-02 DIAGNOSIS — Z9889 Other specified postprocedural states: Secondary | ICD-10-CM

## 2012-04-02 DIAGNOSIS — G459 Transient cerebral ischemic attack, unspecified: Secondary | ICD-10-CM

## 2012-04-02 DIAGNOSIS — I6350 Cerebral infarction due to unspecified occlusion or stenosis of unspecified cerebral artery: Secondary | ICD-10-CM

## 2012-04-02 DIAGNOSIS — Z7901 Long term (current) use of anticoagulants: Secondary | ICD-10-CM

## 2012-04-02 DIAGNOSIS — I4891 Unspecified atrial fibrillation: Secondary | ICD-10-CM

## 2012-04-02 DIAGNOSIS — I059 Rheumatic mitral valve disease, unspecified: Secondary | ICD-10-CM

## 2012-04-22 ENCOUNTER — Ambulatory Visit (INDEPENDENT_AMBULATORY_CARE_PROVIDER_SITE_OTHER): Payer: Medicare Other | Admitting: *Deleted

## 2012-04-22 DIAGNOSIS — Z9889 Other specified postprocedural states: Secondary | ICD-10-CM

## 2012-04-22 DIAGNOSIS — I6350 Cerebral infarction due to unspecified occlusion or stenosis of unspecified cerebral artery: Secondary | ICD-10-CM

## 2012-04-22 DIAGNOSIS — I059 Rheumatic mitral valve disease, unspecified: Secondary | ICD-10-CM

## 2012-04-22 DIAGNOSIS — Z7901 Long term (current) use of anticoagulants: Secondary | ICD-10-CM

## 2012-04-22 DIAGNOSIS — I4891 Unspecified atrial fibrillation: Secondary | ICD-10-CM

## 2012-04-22 DIAGNOSIS — G459 Transient cerebral ischemic attack, unspecified: Secondary | ICD-10-CM

## 2012-04-30 ENCOUNTER — Other Ambulatory Visit: Payer: Self-pay | Admitting: *Deleted

## 2012-04-30 MED ORDER — DIPYRIDAMOLE 50 MG PO TABS: 50.0000 mg | ORAL_TABLET | Freq: Three times a day (TID) | ORAL | Status: DC

## 2012-05-06 ENCOUNTER — Other Ambulatory Visit: Payer: Self-pay | Admitting: *Deleted

## 2012-05-06 NOTE — Telephone Encounter (Signed)
Pt called wanting office to refill hisdipyridamole (PERSANTINE) 50 MG tablet    Called patient back to inform him that office did send his medication to his pharmacy.

## 2012-05-20 ENCOUNTER — Ambulatory Visit (INDEPENDENT_AMBULATORY_CARE_PROVIDER_SITE_OTHER): Payer: Medicare Other

## 2012-05-20 ENCOUNTER — Encounter: Payer: Self-pay | Admitting: *Deleted

## 2012-05-20 DIAGNOSIS — I4891 Unspecified atrial fibrillation: Secondary | ICD-10-CM

## 2012-05-20 DIAGNOSIS — Z9889 Other specified postprocedural states: Secondary | ICD-10-CM

## 2012-05-20 DIAGNOSIS — G459 Transient cerebral ischemic attack, unspecified: Secondary | ICD-10-CM

## 2012-05-20 DIAGNOSIS — Z7901 Long term (current) use of anticoagulants: Secondary | ICD-10-CM

## 2012-05-20 DIAGNOSIS — I6350 Cerebral infarction due to unspecified occlusion or stenosis of unspecified cerebral artery: Secondary | ICD-10-CM

## 2012-05-20 DIAGNOSIS — I059 Rheumatic mitral valve disease, unspecified: Secondary | ICD-10-CM

## 2012-06-17 ENCOUNTER — Ambulatory Visit (INDEPENDENT_AMBULATORY_CARE_PROVIDER_SITE_OTHER): Payer: Medicare Other

## 2012-06-17 DIAGNOSIS — I059 Rheumatic mitral valve disease, unspecified: Secondary | ICD-10-CM

## 2012-06-17 DIAGNOSIS — G459 Transient cerebral ischemic attack, unspecified: Secondary | ICD-10-CM

## 2012-06-17 DIAGNOSIS — I6350 Cerebral infarction due to unspecified occlusion or stenosis of unspecified cerebral artery: Secondary | ICD-10-CM

## 2012-06-17 DIAGNOSIS — I4891 Unspecified atrial fibrillation: Secondary | ICD-10-CM

## 2012-06-17 DIAGNOSIS — Z9889 Other specified postprocedural states: Secondary | ICD-10-CM

## 2012-06-17 DIAGNOSIS — Z7901 Long term (current) use of anticoagulants: Secondary | ICD-10-CM

## 2012-06-17 LAB — POCT INR: INR: 4.3

## 2012-07-09 ENCOUNTER — Ambulatory Visit (INDEPENDENT_AMBULATORY_CARE_PROVIDER_SITE_OTHER): Payer: Medicare Other | Admitting: *Deleted

## 2012-07-09 DIAGNOSIS — I6350 Cerebral infarction due to unspecified occlusion or stenosis of unspecified cerebral artery: Secondary | ICD-10-CM

## 2012-07-09 DIAGNOSIS — Z9889 Other specified postprocedural states: Secondary | ICD-10-CM

## 2012-07-09 DIAGNOSIS — I4891 Unspecified atrial fibrillation: Secondary | ICD-10-CM

## 2012-07-09 DIAGNOSIS — Z7901 Long term (current) use of anticoagulants: Secondary | ICD-10-CM

## 2012-07-09 DIAGNOSIS — G459 Transient cerebral ischemic attack, unspecified: Secondary | ICD-10-CM

## 2012-07-09 DIAGNOSIS — I059 Rheumatic mitral valve disease, unspecified: Secondary | ICD-10-CM

## 2012-07-09 LAB — POCT INR: INR: 3.5

## 2012-08-07 ENCOUNTER — Ambulatory Visit (INDEPENDENT_AMBULATORY_CARE_PROVIDER_SITE_OTHER): Payer: Medicare Other | Admitting: *Deleted

## 2012-08-07 DIAGNOSIS — Z7901 Long term (current) use of anticoagulants: Secondary | ICD-10-CM

## 2012-08-07 DIAGNOSIS — I059 Rheumatic mitral valve disease, unspecified: Secondary | ICD-10-CM

## 2012-08-07 DIAGNOSIS — G459 Transient cerebral ischemic attack, unspecified: Secondary | ICD-10-CM

## 2012-08-07 DIAGNOSIS — I6350 Cerebral infarction due to unspecified occlusion or stenosis of unspecified cerebral artery: Secondary | ICD-10-CM

## 2012-08-07 DIAGNOSIS — I4891 Unspecified atrial fibrillation: Secondary | ICD-10-CM

## 2012-08-07 DIAGNOSIS — Z9889 Other specified postprocedural states: Secondary | ICD-10-CM

## 2012-08-21 ENCOUNTER — Other Ambulatory Visit: Payer: Self-pay | Admitting: *Deleted

## 2012-08-21 MED ORDER — DIPYRIDAMOLE 50 MG PO TABS: 50.0000 mg | ORAL_TABLET | Freq: Three times a day (TID) | ORAL | Status: DC

## 2012-09-04 ENCOUNTER — Ambulatory Visit (INDEPENDENT_AMBULATORY_CARE_PROVIDER_SITE_OTHER): Payer: Medicare Other

## 2012-09-04 DIAGNOSIS — I6350 Cerebral infarction due to unspecified occlusion or stenosis of unspecified cerebral artery: Secondary | ICD-10-CM

## 2012-09-04 DIAGNOSIS — I4891 Unspecified atrial fibrillation: Secondary | ICD-10-CM

## 2012-09-04 DIAGNOSIS — Z7901 Long term (current) use of anticoagulants: Secondary | ICD-10-CM

## 2012-09-04 DIAGNOSIS — Z9889 Other specified postprocedural states: Secondary | ICD-10-CM

## 2012-09-04 DIAGNOSIS — G459 Transient cerebral ischemic attack, unspecified: Secondary | ICD-10-CM

## 2012-09-04 DIAGNOSIS — I059 Rheumatic mitral valve disease, unspecified: Secondary | ICD-10-CM

## 2012-09-16 ENCOUNTER — Encounter: Payer: Self-pay | Admitting: Cardiology

## 2012-09-16 ENCOUNTER — Ambulatory Visit (INDEPENDENT_AMBULATORY_CARE_PROVIDER_SITE_OTHER): Payer: Medicare Other | Admitting: Cardiology

## 2012-09-16 VITALS — BP 144/70 | HR 86 | Ht 70.0 in | Wt 183.0 lb

## 2012-09-16 DIAGNOSIS — E785 Hyperlipidemia, unspecified: Secondary | ICD-10-CM

## 2012-09-16 DIAGNOSIS — I5032 Chronic diastolic (congestive) heart failure: Secondary | ICD-10-CM

## 2012-09-16 DIAGNOSIS — I059 Rheumatic mitral valve disease, unspecified: Secondary | ICD-10-CM

## 2012-09-16 DIAGNOSIS — I4891 Unspecified atrial fibrillation: Secondary | ICD-10-CM

## 2012-09-16 DIAGNOSIS — G459 Transient cerebral ischemic attack, unspecified: Secondary | ICD-10-CM

## 2012-09-16 DIAGNOSIS — I509 Heart failure, unspecified: Secondary | ICD-10-CM

## 2012-09-16 NOTE — Patient Instructions (Addendum)
Your physician recommends that you return for a FASTING lipid profile /liver profile/BMET/CBCd. This is scheduled for Friday June 27,2014. The lab is open from 7:30AM -5PM.  Your physician wants you to follow-up in: 1 year with Dr Shirlee Latch. (June 2015).  You will receive a reminder letter in the mail two months in advance. If you don't receive a letter, please call our office to schedule the follow-up appointment.

## 2012-09-17 NOTE — Progress Notes (Signed)
Patient ID: Anthony Gould, male   DOB: 1933/07/19, 79 y.o.   MRN: [id number] PCP: Dr. Debby Bud  79 yo with history of mechanical mitral valve replacement for severe mitral regurgitation and permanent atrial fibrillation presents for cardiology followup.  Patient has a history of possible TIAs for which he has has been on aspirin and dipyridamole long-term.  Patient had an ETT-myoview in 12/11 with no ischemia or infarction.    Symptomatically, Anthony Gould has been doing well.  He golfs once a week.  He denies chest pain or exertional dyspnea.  He can climb a flight of steps without problems.  No recent TIA-like symptoms.   Echo (6/13) showed EF 50-55%, mildly dilated LV, normally functioning mechanical aortic valve, and PA systolic pressure 38 mmHg.    BP is borderline elevated.  He checks it every day or two at home and it consistently runs in the 130s systolic at home.   ECG: atrial fibrillation with PVC  Labs (4/12): LDL 70, HDL 59, K 4.9, creatinine 1.0, TSH 6.5 (elevated), LFTs normal Labs (6/13): K 4.5, creatinine 1.0, LDL 60 HDL 51  PMH: 1. Atrial fibrillation: Permanent, on coumadin.  He has not been able to tolerate beta blockers due to severe fatigue.  2. DM2: diet-controlled 3. HTN 4. Hyperlipidemia 5. 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  Patient was started on dipyridamole years ago for this.   6. 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  Echo (6/13) with EF 50-55%, mild LVH, mildly dilated LV, mechanical mitral valve functioning normally, biatrial enlargement, PA systolic pressure 38 mmHg.  7. ETT-myoview (12/11): EF 46%, no evidence for ischemia or infarction.  8. Diastolic CHF.  9. 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   FH: Father with MI at 45.  Mother with CVA.   ROS: All systems reviewed and negative except as per HPI.    Current Outpatient Prescriptions   Medication Sig Dispense Refill  . allopurinol (ZYLOPRIM) 300 MG tablet Take 1 tablet (300 mg total) by mouth daily.  90 tablet  3  . aspirin 81 MG EC tablet Take 81 mg by mouth daily.        [name] atorvastatin (LIPITOR) 20 MG tablet Take 1 tablet (20 mg total) by mouth daily.  90 tablet  3  . diltiazem (CARDIZEM CD) 360 MG 24 hr capsule Take 1 capsule (360 mg total) by mouth daily.  90 capsule  3  . dipyridamole (PERSANTINE) 50 MG tablet Take 1 tablet (50 mg total) by mouth 3 (three) times daily.  270 tablet  0  . enalapril (VASOTEC) 20 MG tablet Take 1 tablet (20 mg total) by mouth 2 (two) times daily.  180 tablet  3  . esomeprazole (NEXIUM) 40 MG capsule Take 1 capsule (40 mg total) by mouth daily before breakfast.  90 capsule  3  . furosemide (LASIX) 40 MG tablet Take 1 tablet (40 mg total) by mouth daily. Need to make yearly physical appt. With Dr. Debby Bud  For further refills  90 tablet  3  . magnesium oxide (MAG-OX) 400 MG tablet Take 400 mg by mouth 3 (three) times daily.        [name] warfarin (COUMADIN) 5 MG tablet Take as directed by the Anticoagulation Clinic  135 tablet  1   No current facility-administered medications for this visit.    BP 144/70  Pulse 86  Ht 5\' 10"  (1.778  m)  Wt 183 lb (83.008 kg)  BMI 26.26 kg/m2 General: NAD Neck: JVP 7 cm, no thyromegaly or thyroid nodule.  Lungs: Clear to auscultation bilaterally with normal respiratory effort. CV: Nondisplaced PMI.  Heart irregular S1/S2, mechanical S1, no S3/S4, 1/6 systolic murmur along the sternal border.  No ankle edema.  No carotid bruit.  Normal pedal pulses.  Abdomen: Soft, nontender, no hepatosplenomegaly, no distention.  Neurologic: Alert and oriented x 3.  Psych: Normal affect. Extremities: No clubbing or cyanosis.   Assessment/Plan:  1. Atrial fibrillation:  Stable on coumadin.  Good rate control.    2. HYPERTENSION:   BP elevated here but his readings from home have been consistently under good control.   Continue current meds.    3.  Medtronic-Hall mechanical mitral valve:  Continue coumadin and ASA 81 mg daily.  Last echo in 6/13 showed well-seated valve.  Goal INR 3-3.5 given TIAs while on coumadin with lower INR goal.  I will get a CBC.  4.  TIA:  History of TIAs while on coumadin.  Has been on dipyridamole long-term with no recurrent TIA symptoms.    5. Hyperlipidemia: Check lipids.     6. Chronic diastolic CHF: Patient does not appear significantly volume overloaded.  Continue current Lasix dosing.  Check BMET today.   Anthony Gould 09/17/2012

## 2012-09-19 ENCOUNTER — Other Ambulatory Visit (INDEPENDENT_AMBULATORY_CARE_PROVIDER_SITE_OTHER): Payer: Medicare Other

## 2012-09-19 ENCOUNTER — Other Ambulatory Visit: Payer: Medicare Other

## 2012-09-19 DIAGNOSIS — I4891 Unspecified atrial fibrillation: Secondary | ICD-10-CM

## 2012-09-19 DIAGNOSIS — I059 Rheumatic mitral valve disease, unspecified: Secondary | ICD-10-CM

## 2012-09-19 LAB — LIPID PANEL
Cholesterol: 116 mg/dL (ref 0–200)
HDL: 51.3 mg/dL (ref >39.00)
Triglycerides: 109 mg/dL (ref 0.0–149.0)
VLDL: 21.8 mg/dL (ref 0.0–40.0)

## 2012-09-19 LAB — HEPATIC FUNCTION PANEL
ALT: 43 U/L (ref 0–53)
Albumin: 4.3 g/dL (ref 3.5–5.2)
Total Protein: 7.7 g/dL (ref 6.0–8.3)

## 2012-09-19 LAB — CBC WITH DIFFERENTIAL/PLATELET
Eosinophils Relative: 6.2 % — ABNORMAL HIGH (ref 0.0–5.0)
HCT: 41.9 % (ref 39.0–52.0)
Hemoglobin: 14.1 g/dL (ref 13.0–17.0)
Lymphocytes Relative: 18.7 % (ref 12.0–46.0)
Lymphs Abs: 1.4 10*3/uL (ref 0.7–4.0)
Monocytes Relative: 6.4 % (ref 3.0–12.0)
Neutro Abs: 4.9 10*3/uL (ref 1.4–7.7)
Platelets: 234 10*3/uL (ref 150.0–400.0)
WBC: 7.3 10*3/uL (ref 4.5–10.5)

## 2012-09-19 LAB — BASIC METABOLIC PANEL
GFR: 80.68 mL/min (ref >60.00)
Potassium: 4.7 mEq/L (ref 3.5–5.1)
Sodium: 140 mEq/L (ref 135–145)

## 2012-09-24 ENCOUNTER — Other Ambulatory Visit: Payer: Self-pay | Admitting: *Deleted

## 2012-09-24 MED ORDER — WARFARIN SODIUM 5 MG PO TABS: ORAL_TABLET | ORAL | Status: DC

## 2012-09-30 ENCOUNTER — Telehealth: Payer: Self-pay | Admitting: Cardiology

## 2012-10-16 ENCOUNTER — Ambulatory Visit (INDEPENDENT_AMBULATORY_CARE_PROVIDER_SITE_OTHER): Payer: Medicare Other

## 2012-10-16 DIAGNOSIS — I6350 Cerebral infarction due to unspecified occlusion or stenosis of unspecified cerebral artery: Secondary | ICD-10-CM

## 2012-10-16 DIAGNOSIS — I059 Rheumatic mitral valve disease, unspecified: Secondary | ICD-10-CM

## 2012-10-16 DIAGNOSIS — Z9889 Other specified postprocedural states: Secondary | ICD-10-CM

## 2012-10-16 DIAGNOSIS — I4891 Unspecified atrial fibrillation: Secondary | ICD-10-CM

## 2012-10-16 DIAGNOSIS — G459 Transient cerebral ischemic attack, unspecified: Secondary | ICD-10-CM

## 2012-10-16 DIAGNOSIS — Z7901 Long term (current) use of anticoagulants: Secondary | ICD-10-CM

## 2012-10-16 LAB — POCT INR: INR: 3.8

## 2012-10-27 ENCOUNTER — Other Ambulatory Visit: Payer: Self-pay | Admitting: *Deleted

## 2012-10-27 MED ORDER — DILTIAZEM HCL ER COATED BEADS 360 MG PO CP24: 360.0000 mg | ORAL_CAPSULE | Freq: Every day | ORAL | Status: DC

## 2012-11-20 ENCOUNTER — Ambulatory Visit (INDEPENDENT_AMBULATORY_CARE_PROVIDER_SITE_OTHER): Payer: Medicare Other | Admitting: *Deleted

## 2012-11-20 DIAGNOSIS — I6350 Cerebral infarction due to unspecified occlusion or stenosis of unspecified cerebral artery: Secondary | ICD-10-CM

## 2012-11-20 DIAGNOSIS — Z9889 Other specified postprocedural states: Secondary | ICD-10-CM

## 2012-11-20 DIAGNOSIS — Z7901 Long term (current) use of anticoagulants: Secondary | ICD-10-CM

## 2012-11-20 DIAGNOSIS — I059 Rheumatic mitral valve disease, unspecified: Secondary | ICD-10-CM

## 2012-11-20 DIAGNOSIS — I4891 Unspecified atrial fibrillation: Secondary | ICD-10-CM

## 2012-11-20 DIAGNOSIS — G459 Transient cerebral ischemic attack, unspecified: Secondary | ICD-10-CM

## 2012-11-20 LAB — POCT INR: INR: 3.3

## 2012-12-02 ENCOUNTER — Other Ambulatory Visit: Payer: Self-pay | Admitting: *Deleted

## 2012-12-02 MED ORDER — DIPYRIDAMOLE 50 MG PO TABS: 50.0000 mg | ORAL_TABLET | Freq: Three times a day (TID) | ORAL | Status: DC

## 2012-12-05 ENCOUNTER — Other Ambulatory Visit: Payer: Self-pay

## 2012-12-05 MED ORDER — DIPYRIDAMOLE 50 MG PO TABS: 50.0000 mg | ORAL_TABLET | Freq: Three times a day (TID) | ORAL | Status: DC

## 2012-12-15 ENCOUNTER — Other Ambulatory Visit: Payer: Self-pay

## 2012-12-15 MED ORDER — FUROSEMIDE 40 MG PO TABS: 40.0000 mg | ORAL_TABLET | Freq: Every day | ORAL | Status: DC

## 2012-12-23 ENCOUNTER — Ambulatory Visit (INDEPENDENT_AMBULATORY_CARE_PROVIDER_SITE_OTHER): Payer: Medicare Other

## 2012-12-23 DIAGNOSIS — Z23 Encounter for immunization: Secondary | ICD-10-CM

## 2013-01-01 ENCOUNTER — Ambulatory Visit (INDEPENDENT_AMBULATORY_CARE_PROVIDER_SITE_OTHER): Payer: Medicare Other | Admitting: *Deleted

## 2013-01-01 DIAGNOSIS — I059 Rheumatic mitral valve disease, unspecified: Secondary | ICD-10-CM

## 2013-01-01 DIAGNOSIS — I4891 Unspecified atrial fibrillation: Secondary | ICD-10-CM

## 2013-01-01 DIAGNOSIS — I6350 Cerebral infarction due to unspecified occlusion or stenosis of unspecified cerebral artery: Secondary | ICD-10-CM

## 2013-01-01 DIAGNOSIS — Z7901 Long term (current) use of anticoagulants: Secondary | ICD-10-CM

## 2013-01-01 DIAGNOSIS — Z9889 Other specified postprocedural states: Secondary | ICD-10-CM

## 2013-01-01 DIAGNOSIS — G459 Transient cerebral ischemic attack, unspecified: Secondary | ICD-10-CM

## 2013-01-12 ENCOUNTER — Other Ambulatory Visit: Payer: Self-pay

## 2013-01-12 MED ORDER — ENALAPRIL MALEATE 20 MG PO TABS: 20.0000 mg | ORAL_TABLET | Freq: Two times a day (BID) | ORAL | Status: DC

## 2013-01-12 MED ORDER — ESOMEPRAZOLE MAGNESIUM 40 MG PO CPDR: 40.0000 mg | DELAYED_RELEASE_CAPSULE | Freq: Every day | ORAL | Status: DC

## 2013-01-12 MED ORDER — ATORVASTATIN CALCIUM 20 MG PO TABS: 20.0000 mg | ORAL_TABLET | Freq: Every day | ORAL | Status: DC

## 2013-01-26 ENCOUNTER — Other Ambulatory Visit: Payer: Self-pay | Admitting: *Deleted

## 2013-01-26 MED ORDER — DIPYRIDAMOLE 50 MG PO TABS: 50.0000 mg | ORAL_TABLET | Freq: Three times a day (TID) | ORAL | Status: DC

## 2013-02-09 ENCOUNTER — Encounter: Payer: Self-pay | Admitting: Nurse Practitioner

## 2013-02-09 ENCOUNTER — Telehealth: Payer: Self-pay | Admitting: Cardiology

## 2013-02-09 ENCOUNTER — Ambulatory Visit (INDEPENDENT_AMBULATORY_CARE_PROVIDER_SITE_OTHER): Payer: Medicare Other | Admitting: *Deleted

## 2013-02-09 ENCOUNTER — Ambulatory Visit (INDEPENDENT_AMBULATORY_CARE_PROVIDER_SITE_OTHER): Payer: Medicare Other | Admitting: Nurse Practitioner

## 2013-02-09 VITALS — BP 158/70 | HR 91 | Ht 70.0 in | Wt 187.0 lb

## 2013-02-09 DIAGNOSIS — G459 Transient cerebral ischemic attack, unspecified: Secondary | ICD-10-CM

## 2013-02-09 DIAGNOSIS — Z9889 Other specified postprocedural states: Secondary | ICD-10-CM

## 2013-02-09 DIAGNOSIS — R079 Chest pain, unspecified: Secondary | ICD-10-CM

## 2013-02-09 DIAGNOSIS — M109 Gout, unspecified: Secondary | ICD-10-CM

## 2013-02-09 DIAGNOSIS — R946 Abnormal results of thyroid function studies: Secondary | ICD-10-CM

## 2013-02-09 DIAGNOSIS — I4891 Unspecified atrial fibrillation: Secondary | ICD-10-CM

## 2013-02-09 DIAGNOSIS — I6350 Cerebral infarction due to unspecified occlusion or stenosis of unspecified cerebral artery: Secondary | ICD-10-CM

## 2013-02-09 DIAGNOSIS — Z7901 Long term (current) use of anticoagulants: Secondary | ICD-10-CM

## 2013-02-09 DIAGNOSIS — E119 Type 2 diabetes mellitus without complications: Secondary | ICD-10-CM

## 2013-02-09 DIAGNOSIS — I059 Rheumatic mitral valve disease, unspecified: Secondary | ICD-10-CM

## 2013-02-09 LAB — BASIC METABOLIC PANEL
BUN: 19 mg/dL (ref 6–23)
CO2: 28 mEq/L (ref 19–32)
Calcium: 9.6 mg/dL (ref 8.4–10.5)
Chloride: 101 mEq/L (ref 96–112)
Creatinine, Ser: 1.1 mg/dL (ref 0.4–1.5)
GFR: 68.88 mL/min (ref >60.00)
Glucose, Bld: 106 mg/dL — ABNORMAL HIGH (ref 70–99)
Potassium: 4.1 mEq/L (ref 3.5–5.1)
Sodium: 137 mEq/L (ref 135–145)

## 2013-02-09 LAB — CBC WITH DIFFERENTIAL/PLATELET
Basophils Absolute: 0 10*3/uL (ref 0.0–0.1)
Basophils Relative: 0.4 % (ref 0.0–3.0)
Eosinophils Absolute: 0.3 10*3/uL (ref 0.0–0.7)
Eosinophils Relative: 3.1 % (ref 0.0–5.0)
HCT: 42.6 % (ref 39.0–52.0)
Hemoglobin: 14.2 g/dL (ref 13.0–17.0)
Lymphocytes Relative: 11.1 % — ABNORMAL LOW (ref 12.0–46.0)
Lymphs Abs: 1.1 10*3/uL (ref 0.7–4.0)
MCHC: 33.3 g/dL (ref 30.0–36.0)
MCV: 99.1 fl (ref 78.0–100.0)
Monocytes Absolute: 0.9 10*3/uL (ref 0.1–1.0)
Monocytes Relative: 9 % (ref 3.0–12.0)
Neutro Abs: 7.6 10*3/uL (ref 1.4–7.7)
Neutrophils Relative %: 76.4 % (ref 43.0–77.0)
Platelets: 231 10*3/uL (ref 150.0–400.0)
RBC: 4.3 Mil/uL (ref 4.22–5.81)
RDW: 15.3 % — ABNORMAL HIGH (ref 11.5–14.6)
WBC: 10 10*3/uL (ref 4.5–10.5)

## 2013-02-09 LAB — HEMOGLOBIN A1C: Hgb A1c MFr Bld: 5.8 % (ref 4.6–6.5)

## 2013-02-09 LAB — HEPATIC FUNCTION PANEL
ALT: 20 U/L (ref 0–53)
AST: 35 U/L (ref 0–37)
Albumin: 4.5 g/dL (ref 3.5–5.2)
Alkaline Phosphatase: 86 U/L (ref 39–117)
Bilirubin, Direct: 0.2 mg/dL (ref 0.0–0.3)
Total Bilirubin: 1 mg/dL (ref 0.3–1.2)
Total Protein: 7.6 g/dL (ref 6.0–8.3)

## 2013-02-09 LAB — TSH: TSH: 5.13 u[IU]/mL (ref 0.35–5.50)

## 2013-02-09 LAB — URIC ACID: Uric Acid, Serum: 4 mg/dL (ref 4.0–7.8)

## 2013-02-09 LAB — MAGNESIUM: Magnesium: 1.6 mg/dL (ref 1.5–2.5)

## 2013-02-09 MED ORDER — ESOMEPRAZOLE MAGNESIUM 40 MG PO CPDR: 40.0000 mg | DELAYED_RELEASE_CAPSULE | Freq: Two times a day (BID) | ORAL | Status: DC

## 2013-02-09 NOTE — Progress Notes (Signed)
Anthony Gould Date of Birth: 1934/03/22 Medical Record #[id number]  History of Present Illness: Anthony Gould is seen back today for a work in visit. Seen for Dr. Shirlee Latch. He has had severe MR with past MVR (1986). Other issues include chronic atrial fib, possible TIAs (on aspirin and persantine - multiple TIAs in the past on coumadin), type 2 DM, HTN, HLD, diastolic HF, last echo with EF of 50 to 55% from June of 2013. ETT myoview in 12/11 with no ischemia or infarction.   Comes back today. Here alone. Has a multitude of complaints. He was here in June - was doing well. Has done well up until the past 2 weeks. Woke up one night with his heart beating "hard". Got up and played golf the next day - did fine. Has continued to have this "heart pounding" for the past 2 weeks. He has also had an indigestion like feeling for the past 2 weeks - this has been constant. Feels heavy. He feels nauseated. "Sick inside". Not short of breath. Not sweating. No radiation. Increased his Nexium for one day with no help. BP has been running up. Last night the heart pounding stopped but this heavy feeling has been constant and persistent. Some pain thru his back (separate feeling) - he will have this on occasion. No exertional symptoms. Does not exercise. Not really wanting to take a beta blocker - says that about killed him in the remote past (dyspnea). Not really interested in having another stress test. Admits that he is anxious. No fever or chills. No diarrhea. Upset that his thyroid and A1C haven't been checked. He remains on ASA, persantine, and coumadin.    Current Outpatient Prescriptions  Medication Sig Dispense Refill  . allopurinol (ZYLOPRIM) 300 MG tablet Take 1 tablet (300 mg total) by mouth daily.  90 tablet  3  . aspirin 81 MG EC tablet Take 81 mg by mouth daily.        [name] atorvastatin (LIPITOR) 20 MG tablet Take 1 tablet (20 mg total) by mouth daily.  90 tablet  3  . diltiazem (CARDIZEM CD) 360 MG 24 hr  capsule Take 1 capsule (360 mg total) by mouth daily.  90 capsule  3  . dipyridamole (PERSANTINE) 50 MG tablet Take 1 tablet (50 mg total) by mouth 3 (three) times daily.  270 tablet  1  . enalapril (VASOTEC) 20 MG tablet Take 1 tablet (20 mg total) by mouth 2 (two) times daily.  180 tablet  3  . esomeprazole (NEXIUM) 40 MG capsule Take 1 capsule (40 mg total) by mouth daily before breakfast.  90 capsule  3  . furosemide (LASIX) 40 MG tablet Take 1 tablet (40 mg total) by mouth daily.  90 tablet  3  . magnesium oxide (MAG-OX) 400 MG tablet Take 400 mg by mouth 3 (three) times daily.        [name] warfarin (COUMADIN) 5 MG tablet Take as directed by the Anticoagulation Clinic  135 tablet  1   No current facility-administered medications for this visit.    Allergies  Allergen Reactions  . Sulfonamide Derivatives    PMH:  1. Atrial fibrillation: Permanent, on coumadin. He has not been able to tolerate beta blockers due to severe fatigue.  2. DM2: diet-controlled  3. HTN  4. Hyperlipidemia  5. History of TIA: Multiple TIAs while on coumadin. Patient was started on dipyridamole years ago for this.  6. 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 Echo (6/13) with EF 50-55%, mild LVH, mildly dilated LV, mechanical mitral valve functioning normally, biatrial enlargement, PA systolic pressure 38 mmHg.  7. ETT-myoview (12/11): EF 46%, no evidence for ischemia or infarction.  8. Diastolic CHF.  9. Gout  Past Medical History  Diagnosis Date  . Hyperlipidemia   . HTN (hypertension)   . Atrial fibrillation     last echo 11/22/06 - EF 50-55%, mild dilation LV, mild MR w/41mmHg gradient   . Type II or unspecified type diabetes mellitus without mention of complication, not stated as uncontrolled     lifestyle mangement  . Peripheral edema     venous insufficiency  . Elevated TSH   . TIA (transient ischemic attack)     while on coumadin  . Ventricular ectopy      symptomatic  . Normal nuclear stress test 12/25/01    no ischemia  . Mitral regurgitation     s/p MVR with Medtronic Hall MVR 1986    Past Surgical History  Procedure Laterality Date  . 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 Cholecystectomy, laparoscopic  2003    History  Smoking status  . Never Smoker   Smokeless tobacco  . Not on file    History  Alcohol Use  . Yes    Comment: wine    Family History  Problem Relation Age of Onset  . Coronary artery disease Father   . Prostate cancer Neg Hx   . Colon cancer Neg Hx   . Diabetes Other   . Coronary artery disease Other     Review of Systems: The review of systems is per the HPI.  All other systems were reviewed and are negative.  Physical Exam: BP 158/70  Pulse 91  Ht 5\' 10"  (1.778 m)  Wt 187 lb (84.823 kg)  BMI 26.83 kg/m2 Patient is very pleasant and in no acute distress. He is quite anxious. Skin is warm and dry. Color is normal.  HEENT is unremarkable. Normocephalic/atraumatic. PERRL. Sclera are nonicteric. Neck is supple. No masses. No JVD. Lungs are clear. Cardiac exam shows an irregular rhythm. Abdomen is soft. Extremities are without edema. Gait and ROM are intact. No gross neurologic deficits noted.  LABORATORY DATA: PENDING  EKG today shows atrial fib with PVC's. Rate of 91. Tracing reviewed with Dr. Shirlee Latch  Lab Results  Component Value Date   WBC 7.3 09/19/2012   HGB 14.1 09/19/2012   HCT 41.9 09/19/2012   PLT 234.0 09/19/2012   GLUCOSE 138* 09/19/2012   CHOL 116 09/19/2012   TRIG 109.0 09/19/2012   HDL 51.30 09/19/2012   LDLCALC 43 09/19/2012   ALT 43 09/19/2012   AST 41* 09/19/2012   NA 140 09/19/2012   K 4.7 09/19/2012   CL 105 09/19/2012   CREATININE 1.0 09/19/2012   BUN 20 09/19/2012   CO2 29 09/19/2012   TSH 5.75* 09/12/2011   INR 3.5 01/01/2013   HGBA1C 5.7 09/12/2011   Echo Study Conclusions from June 2013  - Left ventricle: The cavity size was mildly  dilated. Wall thickness was increased in a pattern of mild LVH. Systolic function was normal. The estimated ejection fraction was in the range of 50% to 55%. - Mitral valve: A mechanical prosthesis was present. Valve area by pressure half-time: 2.27cm^2. Valve area by continuity equation (using LVOT flow): 1.25cm^2. - Left atrium: The atrium was moderately to severely dilated. - Right atrium: The atrium  was moderately dilated. - Pulmonary arteries: Systolic pressure was mildly increased. PA peak pressure: 38mm Hg (S).   Assessment / Plan: 1. Palpitations - rate is fairly controlled. He has not tolerated beta blockers in the past.   2. Chest pain/indigestion - some atypical features - one aspirin/persantine/coumadin - seems to be more GI but has multiple CV risk factors  - have discussed with Dr. Shirlee Latch - will arrange for Myoview this week and update the echo. Nexium increased to BID. May need to see GI. Checking labs today as well.   3. Chronic atrial fib - no change in therapy for now.  4. Remote MVR - update his echo per Dr. Shirlee Latch  5. HTN - will see how his tests turn out but for now will leave his medicines as they are per Dr. Alford Highland recommendation.   6. Hypothyroidism - recheck TSH  7. Gout - wants his uric acid rechecked.   Further disposition to follow.   Patient is agreeable to this plan and will call if any problems develop in the interim.   Rosalio Macadamia, RN, ANP-C [facility] [address] [place], [place]  [address]

## 2013-02-09 NOTE — Telephone Encounter (Signed)
Pt states for 2 weeks he has uneasiness in his chest. It actually feels better when he plays golf or does yard work. He also is having hard, but not fast heart beat. He denies chest pain/tightness, SOB, lightheadedness or dizziness. He has a hiatal hernia and wonders if his symptoms are associated with that. He is requesting and I have given him an appt today to be checked.

## 2013-02-09 NOTE — Telephone Encounter (Signed)
New Problem:  Pt states he needs to be seen today by Dr. Shirlee Latch or a PA. Pt states he took his BP yesterday and it was 165/70. Pt states his heart is beating really hard. Pt states he hasn't been able to get a BP reading with his machine this morning.... Pt wants a nurse to call him back letting him know if he can be worked in today.

## 2013-02-09 NOTE — Patient Instructions (Signed)
We are checking labs today  We will update your echo  We will update your stress test (stress Myoview)  Increase your Nexium to 2 pills per day  Call the [facility] office at [phone] if you have any questions, problems or concerns.

## 2013-02-10 ENCOUNTER — Encounter: Payer: Self-pay | Admitting: Cardiology

## 2013-02-11 ENCOUNTER — Other Ambulatory Visit: Payer: Self-pay | Admitting: *Deleted

## 2013-02-11 ENCOUNTER — Other Ambulatory Visit: Payer: Self-pay | Admitting: Physician Assistant

## 2013-02-11 ENCOUNTER — Telehealth: Payer: Self-pay | Admitting: Physician Assistant

## 2013-02-11 ENCOUNTER — Ambulatory Visit (HOSPITAL_COMMUNITY): Payer: Medicare Other | Attending: Cardiovascular Disease | Admitting: Radiology

## 2013-02-11 ENCOUNTER — Encounter: Payer: Self-pay | Admitting: Cardiovascular Disease

## 2013-02-11 VITALS — BP 154/71 | Ht 70.0 in | Wt 186.0 lb

## 2013-02-11 DIAGNOSIS — R079 Chest pain, unspecified: Secondary | ICD-10-CM | POA: Insufficient documentation

## 2013-02-11 DIAGNOSIS — I4729 Other ventricular tachycardia: Secondary | ICD-10-CM | POA: Insufficient documentation

## 2013-02-11 DIAGNOSIS — E785 Hyperlipidemia, unspecified: Secondary | ICD-10-CM | POA: Insufficient documentation

## 2013-02-11 DIAGNOSIS — I472 Ventricular tachycardia, unspecified: Secondary | ICD-10-CM | POA: Insufficient documentation

## 2013-02-11 DIAGNOSIS — I4949 Other premature depolarization: Secondary | ICD-10-CM

## 2013-02-11 DIAGNOSIS — E119 Type 2 diabetes mellitus without complications: Secondary | ICD-10-CM | POA: Insufficient documentation

## 2013-02-11 DIAGNOSIS — Z8673 Personal history of transient ischemic attack (TIA), and cerebral infarction without residual deficits: Secondary | ICD-10-CM | POA: Insufficient documentation

## 2013-02-11 DIAGNOSIS — I2510 Atherosclerotic heart disease of native coronary artery without angina pectoris: Secondary | ICD-10-CM

## 2013-02-11 DIAGNOSIS — I4891 Unspecified atrial fibrillation: Secondary | ICD-10-CM

## 2013-02-11 DIAGNOSIS — R0602 Shortness of breath: Secondary | ICD-10-CM | POA: Insufficient documentation

## 2013-02-11 DIAGNOSIS — M109 Gout, unspecified: Secondary | ICD-10-CM

## 2013-02-11 DIAGNOSIS — I10 Essential (primary) hypertension: Secondary | ICD-10-CM | POA: Insufficient documentation

## 2013-02-11 DIAGNOSIS — R002 Palpitations: Secondary | ICD-10-CM | POA: Insufficient documentation

## 2013-02-11 DIAGNOSIS — Z8249 Family history of ischemic heart disease and other diseases of the circulatory system: Secondary | ICD-10-CM | POA: Insufficient documentation

## 2013-02-11 DIAGNOSIS — R0789 Other chest pain: Secondary | ICD-10-CM

## 2013-02-11 MED ORDER — BISOPROLOL FUMARATE 5 MG PO TABS: 2.5000 mg | ORAL_TABLET | Freq: Every day | ORAL | Status: DC

## 2013-02-11 MED ORDER — TECHNETIUM TC 99M SESTAMIBI GENERIC - CARDIOLITE
30.0000 | Freq: Once | INTRAVENOUS | Status: AC | PRN
  Administered 2013-02-11: 30 via INTRAVENOUS

## 2013-02-11 MED ORDER — TECHNETIUM TC 99M SESTAMIBI GENERIC - CARDIOLITE
10.0000 | Freq: Once | INTRAVENOUS | Status: AC | PRN
  Administered 2013-02-11: 10 via INTRAVENOUS

## 2013-02-11 NOTE — Progress Notes (Addendum)
[facility] SITE 3 NUCLEAR MED [address] [place], [place] [address] [phone]    Cardiology Nuclear Med Study  Anthony Gould is a 79 y.o. male     MRN : [id number]     DOB: [birth year]  Procedure Date: 02/11/2013  Nuclear Med Background Indication for Stress Test:  Evaluation for Ischemia History:  > 25 yrs NL,MVR, '11 NL EF: 46% NSVT, 08/29/11 ECHO: EF: 50-55%, Cardiac Risk Factors: Family History - CAD, Hypertension, Lipids, NIDDM and TIA  Symptoms:  Chest Pain, Palpitations and SOB   Nuclear Pre-Procedure Caffeine/Decaff Intake:  None > 12 hrs NPO After: 8:00pm   Lungs:  clear O2 Sat: 97% on room air. IV 0.9% NS with Angio Cath:  22g  IV Site: L Antecubital, tolerated well IV Started by:  Irean Hong, RN  Chest Size (in):  44 Cup Size: n/a  Height: 5\' 10"  (1.778 m)  Weight:  186 lb (84.369 kg)  BMI:  Body mass index is 26.69 kg/(m^2). Tech Comments:  Took Persantine this am. This patient has extreme amounts of ventricular ectopy with the Afib.    Nuclear Med Study 1 or 2 day study: 1 day  Stress Test Type:  Stress  Reading MD: Kristeen Miss, MD  Order Authorizing Provider:  [name], MD  Resting Radionuclide: Technetium 64m Sestamibi  Resting Radionuclide Dose: 11.0 mCi   Stress Radionuclide:  Technetium 78m Sestamibi  Stress Radionuclide Dose: 32.7 mCi           Stress Protocol Rest HR: 90 Stress HR: 132  Rest BP: 154/71 Stress BP: 159/59  Exercise Time (min): 5:02 METS: 7.0   Predicted Max HR: 143 bpm % Max HR: 92.31 bpm Rate Pressure Product: 21308   Dose of Adenosine (mg):  n/a Dose of Lexiscan: n/a mg  Dose of Atropine (mg): n/a Dose of Dobutamine: n/a mcg/kg/min (at max HR)  Stress Test Technologist: Milana Na, EMT-P  Nuclear Technologist:  Domenic Polite, CNMT     Rest Procedure:  Myocardial perfusion imaging was performed at rest 45 minutes following the intravenous administration of Technetium 60m Sestamibi. Rest ECG:  Atrial Fibrilliation with PVCs  Stress Procedure:  The patient exercised on the treadmill utilizing the Bruce Protocol for 5:02 minutes. The patient stopped due to fatigue, extreme sob, and denied any chest pain.  Technetium 19m Sestamibi was injected at peak exercise and myocardial perfusion imaging was performed after a brief delay. Stress ECG: There were no ST or T wave changes to suggest ischemia.   He had several episodes of NSVT immediately after the exercise.   QPS Raw Data Images:  Normal; no motion artifact; normal heart/lung ratio. Stress Images:  Normal homogeneous uptake in all areas of the myocardium. Rest Images:  Normal homogeneous uptake in all areas of the myocardium. Subtraction (SDS):  No evidence of ischemia. Transient Ischemic Dilatation (Normal <1.22):  1.04 Lung/Heart Ratio (Normal <0.45):  0.29  Quantitative Gated Spect Images QGS EDV:  n/a ml QGS ESV:  n/a ml  Impression Exercise Capacity:  Fair exercise capacity. BP Response:  Normal blood pressure response. Clinical Symptoms:  No significant symptoms noted. ECG Impression:  No significant ST segment change suggestive of ischemia.  He had many salvos of NSVT during and after the treadmill. Comparison with Prior Nuclear Study: No significant change from previous study  Overall Impression:  Normal stress nuclear study.  No ischemia.  LV Ejection Fraction: Study not gated.  LV Wall Motion:  study not gated.  Vesta Mixer, Montez Hageman., MD, [facility] 02/11/2013, 4:20 PM Office - [phone] Pager [phone]

## 2013-02-11 NOTE — Telephone Encounter (Addendum)
Pt called in after-hours with question about medicine. Was prescribed bisoprolol earlier today but it was not at his usual pharmacy for picking up. It looks like this was sent in to [facility]. Pt would prefer it prescribed locally and only wants short-term rx to make sure he tolerates it OK. I re-routed rx to [facility] at his request - bisoprolol 5mg  1/2 tablet daily disp #30 with 1 refill. Pt verbalized understanding and gratitude. [facility] mail order is closed for the night so I cannot call them to cancel out that first prescription. Will forward to Dr. Shirlee Latch and Tresa Endo, RN for their information and request that nurse call [facility] tomorrow to cancel the original rx. Thanks!   [name] Dunn [name]

## 2013-02-12 NOTE — Telephone Encounter (Signed)
Called [facility] and canceled the RX as it was sent into the regular pharmacy.

## 2013-02-16 ENCOUNTER — Encounter: Payer: Self-pay | Admitting: Cardiology

## 2013-02-16 ENCOUNTER — Ambulatory Visit (INDEPENDENT_AMBULATORY_CARE_PROVIDER_SITE_OTHER): Payer: Medicare Other | Admitting: Cardiology

## 2013-02-16 VITALS — BP 153/72 | HR 72 | Ht 70.0 in | Wt 190.8 lb

## 2013-02-16 DIAGNOSIS — E785 Hyperlipidemia, unspecified: Secondary | ICD-10-CM

## 2013-02-16 DIAGNOSIS — I2510 Atherosclerotic heart disease of native coronary artery without angina pectoris: Secondary | ICD-10-CM

## 2013-02-16 DIAGNOSIS — I509 Heart failure, unspecified: Secondary | ICD-10-CM

## 2013-02-16 DIAGNOSIS — I472 Ventricular tachycardia: Secondary | ICD-10-CM

## 2013-02-16 DIAGNOSIS — I4891 Unspecified atrial fibrillation: Secondary | ICD-10-CM

## 2013-02-16 DIAGNOSIS — I5032 Chronic diastolic (congestive) heart failure: Secondary | ICD-10-CM

## 2013-02-16 DIAGNOSIS — I10 Essential (primary) hypertension: Secondary | ICD-10-CM

## 2013-02-16 NOTE — Progress Notes (Signed)
Patient ID: Anthony Gould, male   DOB: Feb 29, 1934, 79 y.o.   MRN: [id number] PCP: Dr. Debby Bud  79 yo with history of mechanical mitral valve replacement for severe mitral regurgitation and permanent atrial fibrillation presents for cardiology followup.  Patient has a history of possible TIAs for which he has has been on aspirin and dipyridamole long-term.  Patient had an ETT-myoview in 12/11 with no ischemia or infarction.  Echo (6/13) showed EF 50-55%, mildly dilated LV, normally functioning mechanical aortic valve, and PA systolic pressure 38 mmHg.    Since I last saw Mr Penick, he was seen by Norma Fredrickson as a work-in visit.  He reported having episodes where his heart would "pound" periodically during the day.  It did not feel particularly fast but just like it was beating hard.  No lightheadedness or syncope.  This lasted for about 2 weeks on and off then subsided.  He also reported chest and abdominal tightness/fullness that would always occur after eating, not with exertion.  He increased his Nexium without much effect.   He was set up for ETT-Cardiolite.  Nuclear images showed no evidence for ischemia or infarction, but he had multiple runs of NSVT while on the treadmill.  He did not have lightheadedness with these episodes.  He was started on bisoprolol 2.5 mg daily.  He has not had any palpitations since that time.  He does not get exertional chest pain.  He is able to play golf, do yardwork, etc without dyspnea.  He has some mild dyspnea after walking 200 yards.  He has had trouble with beta blockers in the past (fatigue) but is tolerating bisoprolol so far.  SBP in the 130s-140s at home (improved since bisoprolol started).    ECG: atrial fibrillation with PVC  Labs (4/12): LDL 70, HDL 59, K 4.9, creatinine 1.0, TSH 6.5 (elevated), LFTs normal Labs (6/13): K 4.5, creatinine 1.0, LDL 60 HDL 51 Labs (6/14): LDL 43, HDL 51 Labs (11/14): K 4.1, creatinine 1.1, TSH normal, HCT  42.6  PMH: 1. Atrial fibrillation: Permanent, on coumadin.  He has not been able to tolerate metoprolol due to severe fatigue.  2. DM2: diet-controlled 3. HTN 4. Hyperlipidemia 5. 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  Patient was started on dipyridamole years ago for this.   6. 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  Echo (6/13) with EF 50-55%, mild LVH, mildly dilated LV, mechanical mitral valve functioning normally, biatrial enlargement, PA systolic pressure 38 mmHg.  7. ETT-myoview (12/11): EF 46%, no evidence for ischemia or infarction, there were runs of NSVT reported.  ETT-Cardiolite (11/14): not gated, 5:02 exercise, no ischemia or infarction by perfusion images but had multiple runs of NSVT.  8. Diastolic CHF.  9. Gout  SH: Married with children.  Former Writer.  Lives in [place].   FH: Father with MI at 94.  Mother with CVA.   ROS: All systems reviewed and negative except as per HPI.    Current Outpatient Prescriptions  Medication Sig Dispense Refill  . allopurinol (ZYLOPRIM) 300 MG tablet Take 1 tablet (300 mg total) by mouth daily.  90 tablet  3  . aspirin 81 MG EC tablet Take 81 mg by mouth daily.        [name] atorvastatin (LIPITOR) 20 MG tablet Take 1 tablet (20 mg total) by mouth daily.  90 tablet  3  . bisoprolol (ZEBETA) 5 MG tablet Take 0.5 tablets (2.5 mg total)  by mouth daily.  30 tablet  1  . diltiazem (CARDIZEM CD) 360 MG 24 hr capsule Take 1 capsule (360 mg total) by mouth daily.  90 capsule  3  . dipyridamole (PERSANTINE) 50 MG tablet Take 1 tablet (50 mg total) by mouth 3 (three) times daily.  270 tablet  1  . enalapril (VASOTEC) 20 MG tablet Take 1 tablet (20 mg total) by mouth 2 (two) times daily.  180 tablet  3  . esomeprazole (NEXIUM) 40 MG capsule Take 1 capsule (40 mg total) by mouth 2 (two) times daily before a meal.  90 capsule  3  . furosemide (LASIX) 40 MG tablet Take 1 tablet (40  mg total) by mouth daily.  90 tablet  3  . magnesium oxide (MAG-OX) 400 MG tablet Take 400 mg by mouth 3 (three) times daily.        [name] warfarin (COUMADIN) 5 MG tablet Take as directed by the Anticoagulation Clinic  135 tablet  1   No current facility-administered medications for this visit.    BP 153/72  Pulse 72  Ht 5\' 10"  (1.778 m)  Wt 86.546 kg (190 lb 12.8 oz)  BMI 27.38 kg/m2 General: NAD Neck: JVP 7 cm, no thyromegaly or thyroid nodule.  Lungs: Clear to auscultation bilaterally with normal respiratory effort. CV: Nondisplaced PMI.  Heart irregular S1/S2, mechanical S1, no S3/S4, 1/6 systolic murmur along the sternal border.  No ankle edema.  No carotid bruit.  Normal pedal pulses.  Abdomen: Soft, nontender, no hepatosplenomegaly, no distention.  Neurologic: Alert and oriented x 3.  Psych: Normal affect. Extremities: No clubbing or cyanosis.   Assessment/Plan:  1. Atrial fibrillation:  Stable on coumadin.  Good rate control.    2. HYPERTENSION:   BP elevated here.  Recently started on bisoprolol and SBP 130s-140s at home (improved).  Would continue current regimen for now.  3.  Medtronic-Hall mechanical mitral valve:  Continue coumadin and ASA 81 mg daily.  Last echo in 6/13 showed well-seated valve.  Goal INR 3-3.5 given TIAs while on coumadin with lower INR goal. Will get echo today as Cardiolite was non-gated and he had NSVT on stress test => assess valves and LV/RV function.  4.  TIA:  History of TIAs while on coumadin.  Has been on dipyridamole long-term with no recurrent TIA symptoms.    5. Hyperlipidemia: Good lipids 6/14.     6. Chronic diastolic CHF: Patient does not appear significantly volume overloaded.  Continue current Lasix dosing.  Recent BMET was normal.  As above, echo to reassess EF given NSVT.  7. NSVT: Multiple runs while doing ETT.  Looking back, it appears that he also had NSVT on his ETT-Myoview back in 2011.  Perfusion images were normal on that study also.   He did not have chest pain or lightheadedness.  This could be an indicator of ischemia though it is reassuring that the NSVT was present on the prior study as well.  I have started him on bisoprolol, which he has tolerated (had trouble with metoprolol in the past).  I am going to get an echo to reassess EF and a 48 hour holter to see if he has runs of NSVT while on bisoprolol.  We talked about left heart cath today.  He is very reticent to undergo cath (concerned about bridging off coumadin given his mechanical mitral valve).  If EF is significantly decreased on echo or if he has a significant amount of NSVT on  holter while on bisoprolol, he will need LHC.   Followup in 1 month.   [name] 02/16/2013   [name] 02/16/2013

## 2013-02-16 NOTE — Patient Instructions (Signed)
Your physician has recommended that you wear a holter monitor. Holter monitors are medical devices that record the heart's electrical activity. Doctors most often use these monitors to diagnose arrhythmias. Arrhythmias are problems with the speed or rhythm of the heartbeat. The monitor is a small, portable device. You can wear one while you do your normal daily activities. This is usually used to diagnose what is causing palpitations/syncope (passing out). 48 hour  Your physician recommends that you schedule a follow-up appointment in: 1 month with Dr Shirlee Latch.

## 2013-02-17 ENCOUNTER — Ambulatory Visit (HOSPITAL_COMMUNITY): Payer: Medicare Other | Attending: Internal Medicine | Admitting: Radiology

## 2013-02-17 ENCOUNTER — Encounter: Payer: Self-pay | Admitting: Radiology

## 2013-02-17 ENCOUNTER — Encounter (INDEPENDENT_AMBULATORY_CARE_PROVIDER_SITE_OTHER): Payer: Medicare Other

## 2013-02-17 DIAGNOSIS — I059 Rheumatic mitral valve disease, unspecified: Secondary | ICD-10-CM

## 2013-02-17 DIAGNOSIS — I4891 Unspecified atrial fibrillation: Secondary | ICD-10-CM | POA: Insufficient documentation

## 2013-02-17 DIAGNOSIS — E785 Hyperlipidemia, unspecified: Secondary | ICD-10-CM | POA: Insufficient documentation

## 2013-02-17 DIAGNOSIS — M109 Gout, unspecified: Secondary | ICD-10-CM

## 2013-02-17 DIAGNOSIS — I472 Ventricular tachycardia: Secondary | ICD-10-CM

## 2013-02-17 DIAGNOSIS — I509 Heart failure, unspecified: Secondary | ICD-10-CM | POA: Insufficient documentation

## 2013-02-17 DIAGNOSIS — E119 Type 2 diabetes mellitus without complications: Secondary | ICD-10-CM | POA: Insufficient documentation

## 2013-02-17 DIAGNOSIS — I10 Essential (primary) hypertension: Secondary | ICD-10-CM | POA: Insufficient documentation

## 2013-02-17 DIAGNOSIS — Z8673 Personal history of transient ischemic attack (TIA), and cerebral infarction without residual deficits: Secondary | ICD-10-CM | POA: Insufficient documentation

## 2013-02-17 DIAGNOSIS — I5032 Chronic diastolic (congestive) heart failure: Secondary | ICD-10-CM

## 2013-02-17 DIAGNOSIS — I359 Nonrheumatic aortic valve disorder, unspecified: Secondary | ICD-10-CM | POA: Insufficient documentation

## 2013-02-17 DIAGNOSIS — I079 Rheumatic tricuspid valve disease, unspecified: Secondary | ICD-10-CM | POA: Insufficient documentation

## 2013-02-17 DIAGNOSIS — R079 Chest pain, unspecified: Secondary | ICD-10-CM

## 2013-02-17 NOTE — Progress Notes (Signed)
Echocardiogram performed.  

## 2013-02-17 NOTE — Progress Notes (Signed)
Patient ID: Anthony Gould, male   DOB: [birth year], 79 y.o.   MRN: [id number]  E cardio 48hr holter monitor applied

## 2013-02-25 ENCOUNTER — Telehealth: Payer: Self-pay | Admitting: *Deleted

## 2013-02-25 NOTE — Telephone Encounter (Signed)
Not typically.  ? If it affected his appetite.  Make sure he is not short of breath.  If he feels better overall, would continue.

## 2013-02-25 NOTE — Telephone Encounter (Signed)
Spoke with patient about monitor results. Pt mentioned he had a 5 pound weight gain since 02/16/13. He has a total of 9 pound weight gain since starting bisoprolol. Pt denies increase in SOB/edema/ abdominal fullness. He states his weight gain started right after starting bisoprolol and is asking if weight gain is related to starting bisoprolol. He overall feels better since starting bisoprolol. I will forward to Dr Shirlee Latch for review.

## 2013-02-25 NOTE — Telephone Encounter (Signed)
Pt denies change in SOB. He will continue to monitor symptoms and call if changes.

## 2013-03-09 ENCOUNTER — Ambulatory Visit (INDEPENDENT_AMBULATORY_CARE_PROVIDER_SITE_OTHER): Payer: Medicare Other | Admitting: *Deleted

## 2013-03-09 DIAGNOSIS — I059 Rheumatic mitral valve disease, unspecified: Secondary | ICD-10-CM

## 2013-03-09 DIAGNOSIS — Z9889 Other specified postprocedural states: Secondary | ICD-10-CM

## 2013-03-09 DIAGNOSIS — I4891 Unspecified atrial fibrillation: Secondary | ICD-10-CM

## 2013-03-09 DIAGNOSIS — G459 Transient cerebral ischemic attack, unspecified: Secondary | ICD-10-CM

## 2013-03-09 DIAGNOSIS — I6350 Cerebral infarction due to unspecified occlusion or stenosis of unspecified cerebral artery: Secondary | ICD-10-CM

## 2013-03-09 DIAGNOSIS — Z7901 Long term (current) use of anticoagulants: Secondary | ICD-10-CM

## 2013-03-09 LAB — POCT INR: INR: 4.3

## 2013-03-23 ENCOUNTER — Ambulatory Visit (INDEPENDENT_AMBULATORY_CARE_PROVIDER_SITE_OTHER): Payer: Medicare Other

## 2013-03-23 DIAGNOSIS — I6350 Cerebral infarction due to unspecified occlusion or stenosis of unspecified cerebral artery: Secondary | ICD-10-CM

## 2013-03-23 DIAGNOSIS — Z7901 Long term (current) use of anticoagulants: Secondary | ICD-10-CM

## 2013-03-23 DIAGNOSIS — G459 Transient cerebral ischemic attack, unspecified: Secondary | ICD-10-CM

## 2013-03-23 DIAGNOSIS — I4891 Unspecified atrial fibrillation: Secondary | ICD-10-CM

## 2013-03-23 DIAGNOSIS — I059 Rheumatic mitral valve disease, unspecified: Secondary | ICD-10-CM

## 2013-03-23 DIAGNOSIS — Z9889 Other specified postprocedural states: Secondary | ICD-10-CM

## 2013-03-23 LAB — POCT INR: INR: 4

## 2013-03-30 ENCOUNTER — Encounter: Payer: Self-pay | Admitting: Cardiology

## 2013-03-30 ENCOUNTER — Ambulatory Visit (INDEPENDENT_AMBULATORY_CARE_PROVIDER_SITE_OTHER): Payer: Medicare Other | Admitting: Cardiology

## 2013-03-30 VITALS — BP 150/90 | HR 64 | Ht 70.0 in | Wt 190.4 lb

## 2013-03-30 DIAGNOSIS — I4729 Other ventricular tachycardia: Secondary | ICD-10-CM

## 2013-03-30 DIAGNOSIS — I059 Rheumatic mitral valve disease, unspecified: Secondary | ICD-10-CM

## 2013-03-30 DIAGNOSIS — I509 Heart failure, unspecified: Secondary | ICD-10-CM

## 2013-03-30 DIAGNOSIS — I472 Ventricular tachycardia: Secondary | ICD-10-CM

## 2013-03-30 DIAGNOSIS — I6350 Cerebral infarction due to unspecified occlusion or stenosis of unspecified cerebral artery: Secondary | ICD-10-CM

## 2013-03-30 DIAGNOSIS — I5032 Chronic diastolic (congestive) heart failure: Secondary | ICD-10-CM

## 2013-03-30 DIAGNOSIS — I4891 Unspecified atrial fibrillation: Secondary | ICD-10-CM

## 2013-03-30 NOTE — Progress Notes (Signed)
Patient ID: Anthony Gould, male   DOB: 1934-03-14, 80 y.o.   MRN: [id number] PCP: Dr. Linda Hedges  80 yo with history of mechanical mitral valve replacement for severe mitral regurgitation and permanent atrial fibrillation presents for cardiology followup.  Patient has a history of possible TIAs for which he has has been on aspirin and dipyridamole long-term.  Patient had an ETT-myoview in 12/11 with no ischemia or infarction.  This fall, he reported episodes of tachypalpitations.  He ended up being set up for ETT-Cardiolite in 11/14.  Nuclear images showed no evidence for ischemia or infarction, but he had multiple runs of NSVT while on the treadmill.  He did not have lightheadedness with these episodes.  He was started on bisoprolol 2.5 mg daily.  Echo in 11/14 showed EF 50% with mild diffuse hypokinesis, moderately dilated RV with normal function, and normal mechanical mitral valve.  Holter in 11/14 showed occasional PVCs and up to 3 beats NSVT but no long runs.    He has not had any palpitations since that time after starting bisoprolol.  He does not get exertional chest pain.  He is able to play golf, do yardwork, etc without dyspnea.  He has some mild dyspnea after walking 200 yards.  He has had trouble with beta blockers in the past (fatigue) but is tolerating bisoprolol.  SBP in the 130s-140s at home.  Overall, he feels like he is at his baseline.   Labs (4/12): LDL 70, HDL 59, K 4.9, creatinine 1.0, TSH 6.5 (elevated), LFTs normal Labs (6/13): K 4.5, creatinine 1.0, LDL 60 HDL 51 Labs (6/14): LDL 43, HDL 51 Labs (11/14): K 4.1, creatinine 1.1, TSH normal, HCT 44  PMH: 1. Atrial fibrillation: Permanent, on coumadin.  He has not been able to tolerate metoprolol due to severe fatigue.  2. DM2: diet-controlled 3. HTN 4. Hyperlipidemia 5. 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  Patient was started on dipyridamole years ago for this.   6. 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  Echo (6/13) with EF 50-55%, mild LVH, mildly dilated LV, mechanical mitral valve functioning normally, biatrial enlargement, PA systolic pressure 38 mmHg.  Echo (11/14) with EF 50%, mild diffuse hypokinesis, normal mechanical mitral valve, moderately dilated RV with normal systolic function.  7. ETT-myoview (12/11): EF 46%, no evidence for ischemia or infarction, there were runs of NSVT reported.  ETT-Cardiolite (11/14): not gated, 5:02 exercise, no ischemia or infarction by perfusion images but had multiple runs of NSVT.  8. Diastolic CHF.  9. Gout 10. NSVT: ETT-Cardiolite in 11/14 with runs of NSVT.  Holter monitor (11/14) showed predominant atrial fibrillation with some PVCs, he had a few run of 3 beats NSVT.   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   FH: Father with MI at 42.  Mother with CVA.   ROS: All systems reviewed and negative except as per HPI.    Current Outpatient Prescriptions  Medication Sig Dispense Refill  . allopurinol (ZYLOPRIM) 300 MG tablet Take 1 tablet (300 mg total) by mouth daily.  90 tablet  3  . aspirin 81 MG EC tablet Take 81 mg by mouth daily.        [name] atorvastatin (LIPITOR) 20 MG tablet Take 1 tablet (20 mg total) by mouth daily.  90 tablet  3  . bisoprolol (ZEBETA) 5 MG tablet Take 0.5 tablets (2.5 mg total) by mouth daily.  30 tablet  1  . diltiazem (CARDIZEM CD) 360 MG 24 hr capsule Take 1 capsule (360 mg total) by mouth daily.  90 capsule  3  . dipyridamole (PERSANTINE) 50 MG tablet Take 1 tablet (50 mg total) by mouth 3 (three) times daily.  270 tablet  1  . enalapril (VASOTEC) 20 MG tablet Take 1 tablet (20 mg total) by mouth 2 (two) times daily.  180 tablet  3  . esomeprazole (NEXIUM) 40 MG capsule Take 1 capsule (40 mg total) by mouth 2 (two) times daily before a meal.  90 capsule  3  . furosemide (LASIX) 40 MG tablet Take 1 tablet (40 mg total) by mouth daily.  90 tablet   3  . magnesium oxide (MAG-OX) 400 MG tablet Take 400 mg by mouth 3 (three) times daily.        [name] warfarin (COUMADIN) 5 MG tablet Take as directed by the Anticoagulation Clinic  135 tablet  1   No current facility-administered medications for this visit.    BP 150/90  Pulse 64  Ht 5\' 10"  (1.778 m)  Wt 86.365 kg (190 lb 6.4 oz)  BMI 27.32 kg/m2 General: NAD Neck: JVP 8 cm, no thyromegaly or thyroid nodule.  Lungs: Clear to auscultation bilaterally with normal respiratory effort. CV: Nondisplaced PMI.  Heart irregular S1/S2, mechanical S1, no I7/T2, 1/6 systolic murmur along the sternal border.  No ankle edema.  No carotid bruit.  Normal pedal pulses.  Abdomen: Soft, nontender, no hepatosplenomegaly, no distention.  Neurologic: Alert and oriented x 3.  Psych: Normal affect. Extremities: No clubbing or cyanosis.   Assessment/Plan:  1. Atrial fibrillation:  Stable on coumadin.  Good rate control.    2. HYPERTENSION:   BP elevated here.  SBP 130s-140s at home .  Would continue current regimen for now.  3.  Medtronic-Hall mechanical mitral valve:  Continue coumadin and ASA 81 mg daily.  Last echo in 11/14 showed well-seated valve.  Goal INR 3-3.5 given TIAs while on coumadin with lower INR goal.  4.  TIA:  History of TIAs while on coumadin.  Has been on dipyridamole long-term with no recurrent TIA symptoms.    5. Hyperlipidemia: Good lipids 6/14.     6. Chronic diastolic CHF: Patient appears mildly volume overloaded by elevated JVP but is comfortable with no significant symptoms.  I am not going to change current Lasix dosing.   7. NSVT: Multiple runs while doing ETT in 11/14.  Looking back, it appears that he also had NSVT on his ETT-Myoview in 2011.  Perfusion images were normal on both studies.  He did not have chest pain or lightheadedness.  I have started him on bisoprolol, which he has tolerated (had trouble with metoprolol in the past).  Echo showed preserved EF and holter showed  occasional PVCs with a couple runs of 3 beats NSVT.  No further ischemic workup at this time, will hold off on LHC.   Followup in 4 months.   Loralie Champagne 03/30/2013   Loralie Champagne 03/30/2013

## 2013-03-30 NOTE — Patient Instructions (Addendum)
Your physician recommends that you continue on your current medications as directed. Please refer to the Current Medication list given to you today.  Your physician wants you to follow-up in: 4 months with fasting labs (lp/bmet)  You will receive a reminder letter in the mail two months in advance. If you don't receive a letter, please call our office to schedule the follow-up appointment.

## 2013-04-13 ENCOUNTER — Ambulatory Visit (INDEPENDENT_AMBULATORY_CARE_PROVIDER_SITE_OTHER): Payer: Medicare Other | Admitting: *Deleted

## 2013-04-13 DIAGNOSIS — I059 Rheumatic mitral valve disease, unspecified: Secondary | ICD-10-CM

## 2013-04-13 DIAGNOSIS — G459 Transient cerebral ischemic attack, unspecified: Secondary | ICD-10-CM

## 2013-04-13 DIAGNOSIS — Z9889 Other specified postprocedural states: Secondary | ICD-10-CM

## 2013-04-13 DIAGNOSIS — I4891 Unspecified atrial fibrillation: Secondary | ICD-10-CM

## 2013-04-13 DIAGNOSIS — Z7901 Long term (current) use of anticoagulants: Secondary | ICD-10-CM

## 2013-04-13 DIAGNOSIS — I6350 Cerebral infarction due to unspecified occlusion or stenosis of unspecified cerebral artery: Secondary | ICD-10-CM

## 2013-04-13 LAB — POCT INR: INR: 3.6

## 2013-04-20 ENCOUNTER — Telehealth: Payer: Self-pay | Admitting: Cardiology

## 2013-04-20 MED ORDER — WARFARIN SODIUM 5 MG PO TABS: ORAL_TABLET | ORAL | Status: DC

## 2013-04-20 NOTE — Telephone Encounter (Signed)
New message     Refill coumadin---[facility]

## 2013-04-20 NOTE — Telephone Encounter (Signed)
Rx sent 

## 2013-05-11 ENCOUNTER — Ambulatory Visit (INDEPENDENT_AMBULATORY_CARE_PROVIDER_SITE_OTHER): Payer: Medicare Other | Admitting: *Deleted

## 2013-05-11 DIAGNOSIS — I059 Rheumatic mitral valve disease, unspecified: Secondary | ICD-10-CM

## 2013-05-11 DIAGNOSIS — G459 Transient cerebral ischemic attack, unspecified: Secondary | ICD-10-CM

## 2013-05-11 DIAGNOSIS — I6350 Cerebral infarction due to unspecified occlusion or stenosis of unspecified cerebral artery: Secondary | ICD-10-CM

## 2013-05-11 DIAGNOSIS — Z9889 Other specified postprocedural states: Secondary | ICD-10-CM

## 2013-05-11 DIAGNOSIS — I4891 Unspecified atrial fibrillation: Secondary | ICD-10-CM

## 2013-05-11 DIAGNOSIS — Z7901 Long term (current) use of anticoagulants: Secondary | ICD-10-CM

## 2013-05-11 LAB — POCT INR: INR: 2.6

## 2013-06-01 ENCOUNTER — Ambulatory Visit (INDEPENDENT_AMBULATORY_CARE_PROVIDER_SITE_OTHER): Payer: Medicare Other | Admitting: Pharmacist

## 2013-06-01 DIAGNOSIS — G459 Transient cerebral ischemic attack, unspecified: Secondary | ICD-10-CM

## 2013-06-01 DIAGNOSIS — I4891 Unspecified atrial fibrillation: Secondary | ICD-10-CM

## 2013-06-01 DIAGNOSIS — I059 Rheumatic mitral valve disease, unspecified: Secondary | ICD-10-CM

## 2013-06-01 DIAGNOSIS — Z9889 Other specified postprocedural states: Secondary | ICD-10-CM

## 2013-06-01 DIAGNOSIS — Z7901 Long term (current) use of anticoagulants: Secondary | ICD-10-CM

## 2013-06-01 DIAGNOSIS — I6350 Cerebral infarction due to unspecified occlusion or stenosis of unspecified cerebral artery: Secondary | ICD-10-CM

## 2013-06-01 LAB — POCT INR: INR: 2.7

## 2013-06-22 ENCOUNTER — Telehealth: Payer: Self-pay

## 2013-06-22 ENCOUNTER — Other Ambulatory Visit: Payer: Self-pay | Admitting: *Deleted

## 2013-06-22 MED ORDER — ALLOPURINOL 300 MG PO TABS: 300.0000 mg | ORAL_TABLET | Freq: Every day | ORAL | Status: DC

## 2013-06-22 NOTE — Telephone Encounter (Signed)
Patient transferring care to Dr.Plotnikov as instructed by Dr.Norins.  Pt medication being refilled until transfer apt. Thanks!

## 2013-06-25 ENCOUNTER — Ambulatory Visit (INDEPENDENT_AMBULATORY_CARE_PROVIDER_SITE_OTHER): Payer: Medicare Other

## 2013-06-25 DIAGNOSIS — I6350 Cerebral infarction due to unspecified occlusion or stenosis of unspecified cerebral artery: Secondary | ICD-10-CM

## 2013-06-25 DIAGNOSIS — Z9889 Other specified postprocedural states: Secondary | ICD-10-CM

## 2013-06-25 DIAGNOSIS — I4891 Unspecified atrial fibrillation: Secondary | ICD-10-CM

## 2013-06-25 DIAGNOSIS — I059 Rheumatic mitral valve disease, unspecified: Secondary | ICD-10-CM

## 2013-06-25 DIAGNOSIS — Z7901 Long term (current) use of anticoagulants: Secondary | ICD-10-CM

## 2013-06-25 DIAGNOSIS — G459 Transient cerebral ischemic attack, unspecified: Secondary | ICD-10-CM

## 2013-06-25 LAB — POCT INR: INR: 3.1

## 2013-06-29 ENCOUNTER — Ambulatory Visit (INDEPENDENT_AMBULATORY_CARE_PROVIDER_SITE_OTHER): Payer: Medicare Other | Admitting: Internal Medicine

## 2013-06-29 ENCOUNTER — Encounter: Payer: Self-pay | Admitting: Internal Medicine

## 2013-06-29 VITALS — BP 160/92 | HR 72 | Temp 97.7°F | Resp 16 | Wt 189.0 lb

## 2013-06-29 DIAGNOSIS — I10 Essential (primary) hypertension: Secondary | ICD-10-CM

## 2013-06-29 DIAGNOSIS — Z23 Encounter for immunization: Secondary | ICD-10-CM

## 2013-06-29 DIAGNOSIS — I2510 Atherosclerotic heart disease of native coronary artery without angina pectoris: Secondary | ICD-10-CM

## 2013-06-29 DIAGNOSIS — E119 Type 2 diabetes mellitus without complications: Secondary | ICD-10-CM

## 2013-06-29 DIAGNOSIS — I4891 Unspecified atrial fibrillation: Secondary | ICD-10-CM

## 2013-06-29 DIAGNOSIS — M109 Gout, unspecified: Secondary | ICD-10-CM

## 2013-06-29 MED ORDER — CELECOXIB 200 MG PO CAPS: 200.0000 mg | ORAL_CAPSULE | Freq: Two times a day (BID) | ORAL | Status: DC

## 2013-06-29 MED ORDER — ALLOPURINOL 300 MG PO TABS: 300.0000 mg | ORAL_TABLET | Freq: Every day | ORAL | Status: DC

## 2013-06-29 NOTE — Progress Notes (Signed)
   Subjective:    HPI  Transfer w/Dr Norins  The patient presents for a follow-up of  chronic hypertension, chronic dyslipidemia, palpitations, anticoagulation, OA. Refusing wellness exams/care Taking Celebrex prn   BP Readings from Last 3 Encounters:  06/29/13 160/92  03/30/13 150/90  02/16/13 153/72   Wt Readings from Last 3 Encounters:  06/29/13 189 lb (85.73 kg)  03/30/13 190 lb 6.4 oz (86.365 kg)  02/16/13 190 lb 12.8 oz (86.546 kg)        Review of Systems  Constitutional: Negative for appetite change, fatigue and unexpected weight change.  HENT: Negative for congestion, nosebleeds, sneezing, sore throat and trouble swallowing.   Eyes: Negative for itching and visual disturbance.  Respiratory: Negative for cough.   Cardiovascular: Negative for chest pain, palpitations and leg swelling.  Gastrointestinal: Negative for nausea, diarrhea, blood in stool and abdominal distention.  Genitourinary: Negative for frequency and hematuria.  Musculoskeletal: Positive for arthralgias. Negative for back pain, gait problem, joint swelling and neck pain.  Skin: Negative for rash.  Neurological: Negative for dizziness, tremors, speech difficulty and weakness.  Psychiatric/Behavioral: Negative for suicidal ideas, sleep disturbance, dysphoric mood and agitation. The patient is not nervous/anxious.        Objective:   Physical Exam  Constitutional: He is oriented to person, place, and time. He appears well-developed. No distress.  NAD  HENT:  Mouth/Throat: Oropharynx is clear and moist.  Eyes: Conjunctivae are normal. Pupils are equal, round, and reactive to light.  Neck: Normal range of motion. No JVD present. No thyromegaly present.  Cardiovascular: Normal rate and intact distal pulses.  Exam reveals no gallop and no friction rub.   No murmur heard. irreg irreg  Pulmonary/Chest: Effort normal and breath sounds normal. No respiratory distress. He has no wheezes. He has no rales.  He exhibits no tenderness.  Abdominal: Soft. Bowel sounds are normal. He exhibits no distension and no mass. There is no tenderness. There is no rebound and no guarding.  Musculoskeletal: Normal range of motion. He exhibits no edema and no tenderness.  Lymphadenopathy:    He has no cervical adenopathy.  Neurological: He is alert and oriented to person, place, and time. He has normal reflexes. No cranial nerve deficit. He exhibits normal muscle tone. He displays a negative Romberg sign. Coordination and gait normal.  No meningeal signs  Skin: Skin is warm and dry. No rash noted.  Psychiatric: He has a normal mood and affect. His behavior is normal. Judgment and thought content normal.    Lab Results  Component Value Date   WBC 10.0 02/09/2013   HGB 14.2 02/09/2013   HCT 42.6 02/09/2013   PLT 231.0 02/09/2013   GLUCOSE 106* 02/09/2013   CHOL 116 09/19/2012   TRIG 109.0 09/19/2012   HDL 51.30 09/19/2012   LDLCALC 43 09/19/2012   ALT 20 02/09/2013   AST 35 02/09/2013   NA 137 02/09/2013   K 4.1 02/09/2013   CL 101 02/09/2013   CREATININE 1.1 02/09/2013   BUN 19 02/09/2013   CO2 28 02/09/2013   TSH 5.13 02/09/2013   INR 3.1 06/25/2013   HGBA1C 5.8 02/09/2013         Assessment & Plan:

## 2013-06-29 NOTE — Assessment & Plan Note (Signed)
Labs

## 2013-06-29 NOTE — Assessment & Plan Note (Signed)
Continue with current prescription therapy as reflected on the Med list. PB is better at home

## 2013-06-29 NOTE — Assessment & Plan Note (Signed)
  On diet  

## 2013-06-29 NOTE — Assessment & Plan Note (Signed)
Continue with current prescription therapy as reflected on the Med list.  

## 2013-06-29 NOTE — Progress Notes (Signed)
Pre visit review using our clinic review tool, if applicable. No additional management support is needed unless otherwise documented below in the visit note. 

## 2013-06-30 ENCOUNTER — Telehealth: Payer: Self-pay | Admitting: Internal Medicine

## 2013-06-30 NOTE — Telephone Encounter (Signed)
Relevant patient education assigned to patient using Emmi. ° °

## 2013-07-13 ENCOUNTER — Telehealth: Payer: Self-pay

## 2013-07-13 ENCOUNTER — Telehealth: Payer: Self-pay | Admitting: *Deleted

## 2013-07-13 NOTE — Telephone Encounter (Signed)
Relevant patient education assigned to patient using Emmi. ° °

## 2013-07-13 NOTE — Telephone Encounter (Signed)
Izora Gala pharmacist from [facility] called states Celebrex taken with Warfarin can increase bleeding.  Please confirm whether pt is to still take Celebrex.    Will need a 90 day supply.  Please advise.  The reference number is [id number].

## 2013-07-13 NOTE — Telephone Encounter (Signed)
Spoke with Huyen advised of MDs message

## 2013-07-13 NOTE — Telephone Encounter (Signed)
OK to fill, no ref Thx

## 2013-07-21 ENCOUNTER — Other Ambulatory Visit: Payer: Self-pay | Admitting: *Deleted

## 2013-07-21 MED ORDER — CELECOXIB 200 MG PO CAPS: 200.0000 mg | ORAL_CAPSULE | Freq: Two times a day (BID) | ORAL | Status: DC

## 2013-07-23 ENCOUNTER — Ambulatory Visit (INDEPENDENT_AMBULATORY_CARE_PROVIDER_SITE_OTHER): Payer: Medicare Other

## 2013-07-23 DIAGNOSIS — I6350 Cerebral infarction due to unspecified occlusion or stenosis of unspecified cerebral artery: Secondary | ICD-10-CM

## 2013-07-23 DIAGNOSIS — Z7901 Long term (current) use of anticoagulants: Secondary | ICD-10-CM

## 2013-07-23 DIAGNOSIS — I4891 Unspecified atrial fibrillation: Secondary | ICD-10-CM

## 2013-07-23 DIAGNOSIS — I059 Rheumatic mitral valve disease, unspecified: Secondary | ICD-10-CM

## 2013-07-23 DIAGNOSIS — G459 Transient cerebral ischemic attack, unspecified: Secondary | ICD-10-CM

## 2013-07-23 DIAGNOSIS — Z9889 Other specified postprocedural states: Secondary | ICD-10-CM

## 2013-07-23 LAB — POCT INR: INR: 3.1

## 2013-08-07 ENCOUNTER — Encounter: Payer: Self-pay | Admitting: Cardiology

## 2013-08-07 ENCOUNTER — Ambulatory Visit (INDEPENDENT_AMBULATORY_CARE_PROVIDER_SITE_OTHER): Payer: Medicare Other | Admitting: Cardiology

## 2013-08-07 VITALS — BP 141/62 | HR 75 | Ht 70.0 in | Wt 186.0 lb

## 2013-08-07 DIAGNOSIS — G459 Transient cerebral ischemic attack, unspecified: Secondary | ICD-10-CM

## 2013-08-07 DIAGNOSIS — M109 Gout, unspecified: Secondary | ICD-10-CM

## 2013-08-07 DIAGNOSIS — I472 Ventricular tachycardia: Secondary | ICD-10-CM

## 2013-08-07 DIAGNOSIS — I4729 Other ventricular tachycardia: Secondary | ICD-10-CM

## 2013-08-07 DIAGNOSIS — Z9889 Other specified postprocedural states: Secondary | ICD-10-CM

## 2013-08-07 DIAGNOSIS — I5032 Chronic diastolic (congestive) heart failure: Secondary | ICD-10-CM

## 2013-08-07 DIAGNOSIS — E119 Type 2 diabetes mellitus without complications: Secondary | ICD-10-CM

## 2013-08-07 DIAGNOSIS — I4891 Unspecified atrial fibrillation: Secondary | ICD-10-CM

## 2013-08-07 DIAGNOSIS — E785 Hyperlipidemia, unspecified: Secondary | ICD-10-CM

## 2013-08-07 DIAGNOSIS — I509 Heart failure, unspecified: Secondary | ICD-10-CM

## 2013-08-07 DIAGNOSIS — I2510 Atherosclerotic heart disease of native coronary artery without angina pectoris: Secondary | ICD-10-CM

## 2013-08-07 DIAGNOSIS — I10 Essential (primary) hypertension: Secondary | ICD-10-CM

## 2013-08-07 LAB — LIPID PANEL
Cholesterol: 153 mg/dL (ref 0–200)
HDL: 72.1 mg/dL (ref >39.00)
LDL Cholesterol: 63 mg/dL (ref 0–99)
Total CHOL/HDL Ratio: 2
Triglycerides: 90 mg/dL (ref 0.0–149.0)
VLDL: 18 mg/dL (ref 0.0–40.0)

## 2013-08-07 LAB — HEPATIC FUNCTION PANEL
ALBUMIN: 4.6 g/dL (ref 3.5–5.2)
ALK PHOS: 82 U/L (ref 39–117)
ALT: 18 U/L (ref 0–53)
AST: 29 U/L (ref 0–37)
BILIRUBIN DIRECT: 0.2 mg/dL (ref 0.0–0.3)
Total Bilirubin: 1.3 mg/dL — ABNORMAL HIGH (ref 0.2–1.2)
Total Protein: 7.9 g/dL (ref 6.0–8.3)

## 2013-08-07 LAB — BASIC METABOLIC PANEL
BUN: 23 mg/dL (ref 6–23)
CALCIUM: 9.9 mg/dL (ref 8.4–10.5)
CO2: 28 mEq/L (ref 19–32)
Chloride: 100 mEq/L (ref 96–112)
Creatinine, Ser: 1.1 mg/dL (ref 0.4–1.5)
GFR: 71.02 mL/min (ref >60.00)
Glucose, Bld: 120 mg/dL — ABNORMAL HIGH (ref 70–99)
Potassium: 4.4 mEq/L (ref 3.5–5.1)
SODIUM: 136 meq/L (ref 135–145)

## 2013-08-07 LAB — CBC WITH DIFFERENTIAL/PLATELET
BASOS ABS: 0.1 10*3/uL (ref 0.0–0.1)
Basophils Relative: 1.3 % (ref 0.0–3.0)
EOS ABS: 0.5 10*3/uL (ref 0.0–0.7)
Eosinophils Relative: 6.2 % — ABNORMAL HIGH (ref 0.0–5.0)
HEMATOCRIT: 44.3 % (ref 39.0–52.0)
Hemoglobin: 14.5 g/dL (ref 13.0–17.0)
LYMPHS ABS: 1.3 10*3/uL (ref 0.7–4.0)
Lymphocytes Relative: 18 % (ref 12.0–46.0)
MCHC: 32.8 g/dL (ref 30.0–36.0)
MCV: 100.9 fl — ABNORMAL HIGH (ref 78.0–100.0)
MONOS PCT: 8 % (ref 3.0–12.0)
Monocytes Absolute: 0.6 10*3/uL (ref 0.1–1.0)
Neutro Abs: 4.9 10*3/uL (ref 1.4–7.7)
Neutrophils Relative %: 66.5 % (ref 43.0–77.0)
PLATELETS: 207 10*3/uL (ref 150.0–400.0)
RBC: 4.39 Mil/uL (ref 4.22–5.81)
RDW: 16 % — AB (ref 11.5–15.5)
WBC: 7.4 10*3/uL (ref 4.0–10.5)

## 2013-08-07 LAB — T4, FREE: Free T4: 0.91 ng/dL (ref 0.60–1.60)

## 2013-08-07 LAB — TSH: TSH: 6.16 u[IU]/mL — ABNORMAL HIGH (ref 0.35–4.50)

## 2013-08-07 LAB — HEMOGLOBIN A1C: Hgb A1c MFr Bld: 6 % (ref 4.6–6.5)

## 2013-08-07 LAB — URIC ACID: Uric Acid, Serum: 4.3 mg/dL (ref 4.0–7.8)

## 2013-08-07 NOTE — Patient Instructions (Addendum)
Your physician wants you to follow-up in: 12 months with Dr Kendall Flack will receive a reminder letter in the mail two months in advance. If you don't receive a letter, please call our office to schedule the follow-up appointment.  Your physician recommends that you return for lab work today

## 2013-08-08 LAB — URINALYSIS
Bilirubin Urine: NEGATIVE
GLUCOSE, UA: NEGATIVE mg/dL
KETONES UR: NEGATIVE mg/dL
Leukocytes, UA: NEGATIVE
Nitrite: NEGATIVE
Protein, ur: 30 mg/dL — AB
SPECIFIC GRAVITY, URINE: 1.014 (ref 1.005–1.030)
Urobilinogen, UA: 0.2 mg/dL (ref 0.0–1.0)
pH: 7 (ref 5.0–8.0)

## 2013-08-09 NOTE — Progress Notes (Signed)
Patient ID: Anthony Gould, male   DOB: 1933/08/05, 80 y.o.   MRN: [id number] PCP: Dr. Alain Marion  80 yo with history of mechanical mitral valve replacement for severe mitral regurgitation and permanent atrial fibrillation presents for cardiology followup.  Patient has a history of possible TIAs for which he has has been on aspirin and dipyridamole long-term.  Patient had an ETT-myoview in 12/11 with no ischemia or infarction.  This fall, he reported episodes of tachypalpitations.  He ended up being set up for ETT-Cardiolite in 11/14.  Nuclear images showed no evidence for ischemia or infarction, but he had multiple runs of NSVT while on the treadmill.  He did not have lightheadedness with these episodes.  He was started on bisoprolol 2.5 mg daily.  Echo in 11/14 showed EF 50% with mild diffuse hypokinesis, moderately dilated RV with normal function, and normal mechanical mitral valve.  Holter in 11/14 showed occasional PVCs and up to 3 beats NSVT but no long runs.    He has not had any palpitations since that time after starting bisoprolol.  He does not get exertional chest pain.  He is able to play golf, do yardwork, etc without dyspnea.  No dyspnea walking on flat ground or with yardwork.  He has had trouble with beta blockers in the past (fatigue) but is tolerating bisoprolol.  SBP in the 130s at home.  Overall, he feels like he is at his baseline.   Labs (4/12): LDL 70, HDL 59, K 4.9, creatinine 1.0, TSH 6.5 (elevated), LFTs normal Labs (6/13): K 4.5, creatinine 1.0, LDL 60 HDL 51 Labs (6/14): LDL 43, HDL 51 Labs (11/14): K 4.1, creatinine 1.1, TSH normal, HCT 44  PMH: 1. Atrial fibrillation: Permanent, on coumadin.  He has not been able to tolerate metoprolol due to severe fatigue.  2. DM2: diet-controlled 3. HTN 4. Hyperlipidemia 5. 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  Patient was started on dipyridamole years ago for this.   6. 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  Echo (6/13) with EF 50-55%, mild LVH, mildly dilated LV, mechanical mitral valve functioning normally, biatrial enlargement, PA systolic pressure 38 mmHg.  Echo (11/14) with EF 50%, mild diffuse hypokinesis, normal mechanical mitral valve, moderately dilated RV with normal systolic function.  7. ETT-myoview (12/11): EF 46%, no evidence for ischemia or infarction, there were runs of NSVT reported.  ETT-Cardiolite (11/14): not gated, 5:02 exercise, no ischemia or infarction by perfusion images but had multiple runs of NSVT.  8. Diastolic CHF.  9. Gout 10. NSVT: ETT-Cardiolite in 11/14 with runs of NSVT.  Holter monitor (11/14) showed predominant atrial fibrillation with some PVCs, he had a few run of 3 beats NSVT.   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   FH: Father with MI at 18.  Mother with CVA.   ROS: All systems reviewed and negative except as per HPI.    Current Outpatient Prescriptions  Medication Sig Dispense Refill  . allopurinol (ZYLOPRIM) 300 MG tablet Take 1 tablet (300 mg total) by mouth daily.  90 tablet  3  . aspirin 81 MG EC tablet Take 81 mg by mouth daily.        [name] atorvastatin (LIPITOR) 20 MG tablet Take 1 tablet (20 mg total) by mouth daily.  90 tablet  3  . bisoprolol (ZEBETA) 5 MG tablet Take 0.5 tablets (2.5 mg total) by mouth daily.  30 tablet  1  . celecoxib (CELEBREX) 200 MG capsule Take 200 mg by mouth. One tab once a week      . diltiazem (CARDIZEM CD) 360 MG 24 hr capsule Take 1 capsule (360 mg total) by mouth daily.  90 capsule  3  . dipyridamole (PERSANTINE) 50 MG tablet Take 1 tablet (50 mg total) by mouth 3 (three) times daily.  270 tablet  1  . enalapril (VASOTEC) 20 MG tablet Take 1 tablet (20 mg total) by mouth 2 (two) times daily.  180 tablet  3  . esomeprazole (NEXIUM) 40 MG capsule Take 1 capsule (40 mg total) by mouth 2 (two) times daily before a meal.  90 capsule  3  .  furosemide (LASIX) 40 MG tablet Take 1 tablet (40 mg total) by mouth daily.  90 tablet  3  . magnesium oxide (MAG-OX) 400 MG tablet Take 400 mg by mouth 3 (three) times daily.        [name] warfarin (COUMADIN) 5 MG tablet Take as directed by the Anticoagulation Clinic  135 tablet  1   No current facility-administered medications for this visit.    BP 141/62  Pulse 75  Ht 5\' 10"  (1.778 m)  Wt 84.369 kg (186 lb)  BMI 26.69 kg/m2 General: NAD Neck: JVP 8 cm, no thyromegaly or thyroid nodule.  Lungs: Clear to auscultation bilaterally with normal respiratory effort. CV: Nondisplaced PMI.  Heart irregular S1/S2, mechanical S1, no X3/K4, 1/6 systolic murmur along the sternal border.  No ankle edema.  No carotid bruit.  Normal pedal pulses.  Abdomen: Soft, nontender, no hepatosplenomegaly, no distention.  Neurologic: Alert and oriented x 3.  Psych: Normal affect. Extremities: No clubbing or cyanosis.   Assessment/Plan:  1. Atrial fibrillation:  Stable on coumadin.  Good rate control.    2. HYPERTENSION:   BP slightly elevated here.  SBP 130s at home .  Would continue current regimen for now.  3.  Medtronic-Hall mechanical mitral valve:  Continue coumadin and ASA 81 mg daily.  Last echo in 11/14 showed well-seated valve.  Goal INR 3-3.5 given TIAs while on coumadin with lower INR goal.  CBC today.  4.  TIA:  History of TIAs while on coumadin.  Has been on dipyridamole long-term with no recurrent TIA symptoms.    5. Hyperlipidemia: Check lipids today.      6. Chronic diastolic CHF: Patient appears mildly volume overloaded by elevated JVP but is comfortable with no significant symptoms.  I am not going to change current Lasix dosing. Check BMET.  7. NSVT: Multiple runs while doing ETT in 11/14.  Looking back, it appears that he also had NSVT on his ETT-Myoview in 2011.  Perfusion images were normal on both studies.  He did not have chest pain or lightheadedness.  I have started him on bisoprolol, which  he has tolerated (had trouble with metoprolol in the past).  Echo showed preserved EF and holter showed occasional PVCs with a couple runs of 3 beats NSVT.  No further ischemic workup at this time.   Followup 1 year.   Larey Dresser 08/09/2013

## 2013-08-10 ENCOUNTER — Telehealth: Payer: Self-pay | Admitting: *Deleted

## 2013-08-10 NOTE — Telephone Encounter (Signed)
Message copied by Earvin Hansen on Mon Aug 10, 2013  8:45 AM ------      Message from: Larey Dresser      Created: Sun Aug 09, 2013 10:33 PM       ok ------

## 2013-08-10 NOTE — Telephone Encounter (Signed)
Advised patient of lab results  

## 2013-08-14 ENCOUNTER — Telehealth: Payer: Self-pay | Admitting: *Deleted

## 2013-08-14 NOTE — Telephone Encounter (Signed)
Pt called in reference to his lab results, he does not want to start Thyroid medication because his Free T4 was normal.

## 2013-08-20 ENCOUNTER — Ambulatory Visit (INDEPENDENT_AMBULATORY_CARE_PROVIDER_SITE_OTHER): Payer: Medicare Other | Admitting: *Deleted

## 2013-08-20 DIAGNOSIS — Z9889 Other specified postprocedural states: Secondary | ICD-10-CM

## 2013-08-20 DIAGNOSIS — G459 Transient cerebral ischemic attack, unspecified: Secondary | ICD-10-CM

## 2013-08-20 DIAGNOSIS — I6350 Cerebral infarction due to unspecified occlusion or stenosis of unspecified cerebral artery: Secondary | ICD-10-CM

## 2013-08-20 DIAGNOSIS — I059 Rheumatic mitral valve disease, unspecified: Secondary | ICD-10-CM

## 2013-08-20 DIAGNOSIS — Z7901 Long term (current) use of anticoagulants: Secondary | ICD-10-CM

## 2013-08-20 DIAGNOSIS — I4891 Unspecified atrial fibrillation: Secondary | ICD-10-CM

## 2013-08-20 LAB — POCT INR: INR: 2.4

## 2013-09-07 ENCOUNTER — Ambulatory Visit (INDEPENDENT_AMBULATORY_CARE_PROVIDER_SITE_OTHER): Payer: Medicare Other

## 2013-09-07 DIAGNOSIS — I6350 Cerebral infarction due to unspecified occlusion or stenosis of unspecified cerebral artery: Secondary | ICD-10-CM

## 2013-09-07 DIAGNOSIS — I059 Rheumatic mitral valve disease, unspecified: Secondary | ICD-10-CM

## 2013-09-07 DIAGNOSIS — G459 Transient cerebral ischemic attack, unspecified: Secondary | ICD-10-CM

## 2013-09-07 DIAGNOSIS — Z7901 Long term (current) use of anticoagulants: Secondary | ICD-10-CM

## 2013-09-07 DIAGNOSIS — Z9889 Other specified postprocedural states: Secondary | ICD-10-CM

## 2013-09-07 DIAGNOSIS — I4891 Unspecified atrial fibrillation: Secondary | ICD-10-CM

## 2013-09-07 LAB — POCT INR: INR: 3.9

## 2013-09-18 ENCOUNTER — Other Ambulatory Visit: Payer: Self-pay

## 2013-09-18 MED ORDER — DIPYRIDAMOLE 50 MG PO TABS: 50.0000 mg | ORAL_TABLET | Freq: Three times a day (TID) | ORAL | Status: DC

## 2013-09-28 ENCOUNTER — Ambulatory Visit (INDEPENDENT_AMBULATORY_CARE_PROVIDER_SITE_OTHER): Payer: Medicare Other

## 2013-09-28 DIAGNOSIS — Z9889 Other specified postprocedural states: Secondary | ICD-10-CM

## 2013-09-28 DIAGNOSIS — I059 Rheumatic mitral valve disease, unspecified: Secondary | ICD-10-CM

## 2013-09-28 DIAGNOSIS — G459 Transient cerebral ischemic attack, unspecified: Secondary | ICD-10-CM

## 2013-09-28 DIAGNOSIS — I4891 Unspecified atrial fibrillation: Secondary | ICD-10-CM

## 2013-09-28 DIAGNOSIS — I6350 Cerebral infarction due to unspecified occlusion or stenosis of unspecified cerebral artery: Secondary | ICD-10-CM

## 2013-09-28 DIAGNOSIS — Z7901 Long term (current) use of anticoagulants: Secondary | ICD-10-CM

## 2013-09-28 LAB — POCT INR: INR: 3.9

## 2013-10-19 ENCOUNTER — Ambulatory Visit (INDEPENDENT_AMBULATORY_CARE_PROVIDER_SITE_OTHER): Payer: Medicare Other | Admitting: Pharmacist

## 2013-10-19 DIAGNOSIS — G459 Transient cerebral ischemic attack, unspecified: Secondary | ICD-10-CM

## 2013-10-19 DIAGNOSIS — I4891 Unspecified atrial fibrillation: Secondary | ICD-10-CM

## 2013-10-19 DIAGNOSIS — I6350 Cerebral infarction due to unspecified occlusion or stenosis of unspecified cerebral artery: Secondary | ICD-10-CM

## 2013-10-19 DIAGNOSIS — Z9889 Other specified postprocedural states: Secondary | ICD-10-CM

## 2013-10-19 DIAGNOSIS — Z7901 Long term (current) use of anticoagulants: Secondary | ICD-10-CM

## 2013-10-19 DIAGNOSIS — I059 Rheumatic mitral valve disease, unspecified: Secondary | ICD-10-CM

## 2013-10-19 LAB — POCT INR: INR: 2.4

## 2013-11-02 ENCOUNTER — Telehealth: Payer: Self-pay

## 2013-11-02 ENCOUNTER — Other Ambulatory Visit: Payer: Self-pay

## 2013-11-02 ENCOUNTER — Other Ambulatory Visit: Payer: Self-pay | Admitting: *Deleted

## 2013-11-02 MED ORDER — WARFARIN SODIUM 5 MG PO TABS: ORAL_TABLET | ORAL | Status: DC

## 2013-11-02 MED ORDER — DILTIAZEM HCL ER COATED BEADS 360 MG PO CP24: 360.0000 mg | ORAL_CAPSULE | Freq: Every day | ORAL | Status: DC

## 2013-11-02 MED ORDER — FUROSEMIDE 40 MG PO TABS: 40.0000 mg | ORAL_TABLET | Freq: Every day | ORAL | Status: DC

## 2013-11-02 MED ORDER — BISOPROLOL FUMARATE 5 MG PO TABS: 2.5000 mg | ORAL_TABLET | Freq: Every day | ORAL | Status: DC

## 2013-11-02 NOTE — Telephone Encounter (Signed)
Rx sent 

## 2013-11-09 ENCOUNTER — Ambulatory Visit (INDEPENDENT_AMBULATORY_CARE_PROVIDER_SITE_OTHER): Payer: Medicare Other | Admitting: *Deleted

## 2013-11-09 DIAGNOSIS — I4891 Unspecified atrial fibrillation: Secondary | ICD-10-CM

## 2013-11-09 DIAGNOSIS — I6350 Cerebral infarction due to unspecified occlusion or stenosis of unspecified cerebral artery: Secondary | ICD-10-CM

## 2013-11-09 DIAGNOSIS — I059 Rheumatic mitral valve disease, unspecified: Secondary | ICD-10-CM

## 2013-11-09 DIAGNOSIS — G459 Transient cerebral ischemic attack, unspecified: Secondary | ICD-10-CM

## 2013-11-09 DIAGNOSIS — Z9889 Other specified postprocedural states: Secondary | ICD-10-CM

## 2013-11-09 DIAGNOSIS — Z7901 Long term (current) use of anticoagulants: Secondary | ICD-10-CM

## 2013-11-09 LAB — POCT INR: INR: 3.2

## 2013-12-04 ENCOUNTER — Ambulatory Visit (INDEPENDENT_AMBULATORY_CARE_PROVIDER_SITE_OTHER): Payer: Medicare Other | Admitting: *Deleted

## 2013-12-04 DIAGNOSIS — Z7901 Long term (current) use of anticoagulants: Secondary | ICD-10-CM

## 2013-12-04 DIAGNOSIS — I4891 Unspecified atrial fibrillation: Secondary | ICD-10-CM

## 2013-12-04 DIAGNOSIS — I059 Rheumatic mitral valve disease, unspecified: Secondary | ICD-10-CM

## 2013-12-04 DIAGNOSIS — I6350 Cerebral infarction due to unspecified occlusion or stenosis of unspecified cerebral artery: Secondary | ICD-10-CM

## 2013-12-04 DIAGNOSIS — G459 Transient cerebral ischemic attack, unspecified: Secondary | ICD-10-CM

## 2013-12-04 DIAGNOSIS — Z9889 Other specified postprocedural states: Secondary | ICD-10-CM

## 2013-12-04 LAB — POCT INR: INR: 3.8

## 2013-12-28 ENCOUNTER — Ambulatory Visit (INDEPENDENT_AMBULATORY_CARE_PROVIDER_SITE_OTHER): Payer: Medicare Other | Admitting: *Deleted

## 2013-12-28 DIAGNOSIS — Z9889 Other specified postprocedural states: Secondary | ICD-10-CM

## 2013-12-28 DIAGNOSIS — G459 Transient cerebral ischemic attack, unspecified: Secondary | ICD-10-CM

## 2013-12-28 DIAGNOSIS — I639 Cerebral infarction, unspecified: Secondary | ICD-10-CM

## 2013-12-28 DIAGNOSIS — I059 Rheumatic mitral valve disease, unspecified: Secondary | ICD-10-CM

## 2013-12-28 DIAGNOSIS — I6350 Cerebral infarction due to unspecified occlusion or stenosis of unspecified cerebral artery: Secondary | ICD-10-CM

## 2013-12-28 DIAGNOSIS — Z23 Encounter for immunization: Secondary | ICD-10-CM

## 2013-12-28 DIAGNOSIS — I4891 Unspecified atrial fibrillation: Secondary | ICD-10-CM

## 2013-12-28 DIAGNOSIS — Z7901 Long term (current) use of anticoagulants: Secondary | ICD-10-CM

## 2013-12-28 LAB — POCT INR: INR: 2.9

## 2014-01-05 NOTE — Telephone Encounter (Signed)
error 

## 2014-01-25 ENCOUNTER — Ambulatory Visit (INDEPENDENT_AMBULATORY_CARE_PROVIDER_SITE_OTHER): Payer: Medicare Other | Admitting: *Deleted

## 2014-01-25 DIAGNOSIS — I6350 Cerebral infarction due to unspecified occlusion or stenosis of unspecified cerebral artery: Secondary | ICD-10-CM

## 2014-01-25 DIAGNOSIS — Z9889 Other specified postprocedural states: Secondary | ICD-10-CM

## 2014-01-25 DIAGNOSIS — G459 Transient cerebral ischemic attack, unspecified: Secondary | ICD-10-CM

## 2014-01-25 DIAGNOSIS — I4891 Unspecified atrial fibrillation: Secondary | ICD-10-CM

## 2014-01-25 DIAGNOSIS — Z7901 Long term (current) use of anticoagulants: Secondary | ICD-10-CM

## 2014-01-25 DIAGNOSIS — I059 Rheumatic mitral valve disease, unspecified: Secondary | ICD-10-CM

## 2014-01-25 DIAGNOSIS — I639 Cerebral infarction, unspecified: Secondary | ICD-10-CM

## 2014-01-25 LAB — POCT INR: INR: 3.5

## 2014-02-22 ENCOUNTER — Ambulatory Visit (INDEPENDENT_AMBULATORY_CARE_PROVIDER_SITE_OTHER): Payer: Medicare Other | Admitting: Pharmacist

## 2014-02-22 DIAGNOSIS — I639 Cerebral infarction, unspecified: Secondary | ICD-10-CM

## 2014-02-22 DIAGNOSIS — I059 Rheumatic mitral valve disease, unspecified: Secondary | ICD-10-CM

## 2014-02-22 DIAGNOSIS — I4891 Unspecified atrial fibrillation: Secondary | ICD-10-CM

## 2014-02-22 DIAGNOSIS — Z7901 Long term (current) use of anticoagulants: Secondary | ICD-10-CM

## 2014-02-22 DIAGNOSIS — Z9889 Other specified postprocedural states: Secondary | ICD-10-CM

## 2014-02-22 DIAGNOSIS — G459 Transient cerebral ischemic attack, unspecified: Secondary | ICD-10-CM

## 2014-02-22 DIAGNOSIS — I6350 Cerebral infarction due to unspecified occlusion or stenosis of unspecified cerebral artery: Secondary | ICD-10-CM

## 2014-02-22 LAB — POCT INR: INR: 3.2

## 2014-03-01 ENCOUNTER — Other Ambulatory Visit: Payer: Self-pay | Admitting: Internal Medicine

## 2014-03-01 NOTE — Telephone Encounter (Signed)
Pt is requesting Atorvastatin (generic for Lipitor) called to [facility]. Pt is requesting Nexium. Pt needs Nenalapril (generic for Vasotec) pt needs this called to [facility] for immediate refill, and then long term prescription called to [facility]

## 2014-03-01 NOTE — Telephone Encounter (Signed)
Ok all three. Sch ROV q 6 mo Thx

## 2014-03-02 MED ORDER — ENALAPRIL MALEATE 20 MG PO TABS: 20.0000 mg | ORAL_TABLET | Freq: Two times a day (BID) | ORAL | Status: DC

## 2014-03-02 MED ORDER — ESOMEPRAZOLE MAGNESIUM 40 MG PO CPDR: 40.0000 mg | DELAYED_RELEASE_CAPSULE | Freq: Two times a day (BID) | ORAL | Status: DC

## 2014-03-02 MED ORDER — ATORVASTATIN CALCIUM 20 MG PO TABS: 20.0000 mg | ORAL_TABLET | Freq: Every day | ORAL | Status: DC

## 2014-03-02 NOTE — Telephone Encounter (Signed)
Notified pt refills sent to mail service & local for 30 day...Anthony Gould

## 2014-03-02 NOTE — Telephone Encounter (Signed)
Patient is out of nenalapril.  He is requesting a script to be sent to [facility] [address] to get him through until he gets meds from [facility].

## 2014-03-03 ENCOUNTER — Other Ambulatory Visit: Payer: Self-pay

## 2014-03-03 MED ORDER — ENALAPRIL MALEATE 20 MG PO TABS: 20.0000 mg | ORAL_TABLET | Freq: Two times a day (BID) | ORAL | Status: DC

## 2014-03-03 MED ORDER — FUROSEMIDE 40 MG PO TABS: 40.0000 mg | ORAL_TABLET | Freq: Every day | ORAL | Status: DC

## 2014-03-03 MED ORDER — ATORVASTATIN CALCIUM 20 MG PO TABS: 20.0000 mg | ORAL_TABLET | Freq: Every day | ORAL | Status: DC

## 2014-03-16 ENCOUNTER — Ambulatory Visit (INDEPENDENT_AMBULATORY_CARE_PROVIDER_SITE_OTHER): Payer: Medicare Other | Admitting: *Deleted

## 2014-03-16 DIAGNOSIS — G459 Transient cerebral ischemic attack, unspecified: Secondary | ICD-10-CM

## 2014-03-16 DIAGNOSIS — I059 Rheumatic mitral valve disease, unspecified: Secondary | ICD-10-CM

## 2014-03-16 DIAGNOSIS — Z7901 Long term (current) use of anticoagulants: Secondary | ICD-10-CM

## 2014-03-16 DIAGNOSIS — Z9889 Other specified postprocedural states: Secondary | ICD-10-CM

## 2014-03-16 DIAGNOSIS — I6350 Cerebral infarction due to unspecified occlusion or stenosis of unspecified cerebral artery: Secondary | ICD-10-CM

## 2014-03-16 DIAGNOSIS — I639 Cerebral infarction, unspecified: Secondary | ICD-10-CM

## 2014-03-16 DIAGNOSIS — I4891 Unspecified atrial fibrillation: Secondary | ICD-10-CM

## 2014-03-16 LAB — POCT INR: INR: 3.4

## 2014-03-22 ENCOUNTER — Other Ambulatory Visit: Payer: Self-pay | Admitting: Cardiology

## 2014-03-29 ENCOUNTER — Other Ambulatory Visit: Payer: Self-pay

## 2014-03-29 MED ORDER — ESOMEPRAZOLE MAGNESIUM 40 MG PO CPDR: 40.0000 mg | DELAYED_RELEASE_CAPSULE | Freq: Two times a day (BID) | ORAL | Status: DC

## 2014-04-19 ENCOUNTER — Ambulatory Visit (INDEPENDENT_AMBULATORY_CARE_PROVIDER_SITE_OTHER): Payer: Medicare Other | Admitting: *Deleted

## 2014-04-19 DIAGNOSIS — I059 Rheumatic mitral valve disease, unspecified: Secondary | ICD-10-CM

## 2014-04-19 DIAGNOSIS — Z9889 Other specified postprocedural states: Secondary | ICD-10-CM

## 2014-04-19 DIAGNOSIS — I639 Cerebral infarction, unspecified: Secondary | ICD-10-CM

## 2014-04-19 DIAGNOSIS — I4891 Unspecified atrial fibrillation: Secondary | ICD-10-CM

## 2014-04-19 DIAGNOSIS — G459 Transient cerebral ischemic attack, unspecified: Secondary | ICD-10-CM

## 2014-04-19 DIAGNOSIS — Z7901 Long term (current) use of anticoagulants: Secondary | ICD-10-CM

## 2014-04-19 DIAGNOSIS — I6350 Cerebral infarction due to unspecified occlusion or stenosis of unspecified cerebral artery: Secondary | ICD-10-CM

## 2014-04-19 LAB — POCT INR: INR: 3.6

## 2014-04-26 ENCOUNTER — Other Ambulatory Visit: Payer: Self-pay

## 2014-04-29 ENCOUNTER — Telehealth: Payer: Self-pay | Admitting: Internal Medicine

## 2014-04-29 ENCOUNTER — Other Ambulatory Visit: Payer: Self-pay | Admitting: Internal Medicine

## 2014-04-29 NOTE — Telephone Encounter (Signed)
What is covered? Thx

## 2014-04-30 MED ORDER — PANTOPRAZOLE SODIUM 40 MG PO TBEC: 40.0000 mg | DELAYED_RELEASE_TABLET | Freq: Two times a day (BID) | ORAL | Status: DC

## 2014-04-30 NOTE — Telephone Encounter (Signed)
No other info listed. Please advise what med?

## 2014-04-30 NOTE — Telephone Encounter (Signed)
See meds 

## 2014-05-12 ENCOUNTER — Other Ambulatory Visit: Payer: Self-pay | Admitting: Geriatric Medicine

## 2014-05-12 MED ORDER — PANTOPRAZOLE SODIUM 40 MG PO TBEC: 40.0000 mg | DELAYED_RELEASE_TABLET | Freq: Two times a day (BID) | ORAL | Status: DC

## 2014-05-18 ENCOUNTER — Other Ambulatory Visit: Payer: Self-pay | Admitting: Internal Medicine

## 2014-05-26 ENCOUNTER — Other Ambulatory Visit: Payer: Self-pay | Admitting: *Deleted

## 2014-05-26 ENCOUNTER — Ambulatory Visit (INDEPENDENT_AMBULATORY_CARE_PROVIDER_SITE_OTHER): Payer: Medicare Other | Admitting: *Deleted

## 2014-05-26 ENCOUNTER — Telehealth: Payer: Self-pay

## 2014-05-26 DIAGNOSIS — Z7901 Long term (current) use of anticoagulants: Secondary | ICD-10-CM

## 2014-05-26 DIAGNOSIS — I6350 Cerebral infarction due to unspecified occlusion or stenosis of unspecified cerebral artery: Secondary | ICD-10-CM

## 2014-05-26 DIAGNOSIS — G459 Transient cerebral ischemic attack, unspecified: Secondary | ICD-10-CM

## 2014-05-26 DIAGNOSIS — Z9889 Other specified postprocedural states: Secondary | ICD-10-CM

## 2014-05-26 DIAGNOSIS — I639 Cerebral infarction, unspecified: Secondary | ICD-10-CM

## 2014-05-26 DIAGNOSIS — I4891 Unspecified atrial fibrillation: Secondary | ICD-10-CM

## 2014-05-26 DIAGNOSIS — I059 Rheumatic mitral valve disease, unspecified: Secondary | ICD-10-CM

## 2014-05-26 LAB — POCT INR: INR: 4.1

## 2014-05-26 MED ORDER — WARFARIN SODIUM 5 MG PO TABS: ORAL_TABLET | ORAL | Status: DC

## 2014-05-27 NOTE — Telephone Encounter (Signed)
Form pending MD sig

## 2014-05-27 NOTE — Telephone Encounter (Signed)
Req for fax for PA of PROTONIX 40 mg bid due to quantity limit. Fax rec'd 3/3.

## 2014-05-28 NOTE — Telephone Encounter (Signed)
Signed completed form and faxed to plan

## 2014-06-10 ENCOUNTER — Encounter: Payer: Self-pay | Admitting: *Deleted

## 2014-06-10 ENCOUNTER — Ambulatory Visit (INDEPENDENT_AMBULATORY_CARE_PROVIDER_SITE_OTHER): Payer: Medicare Other | Admitting: Cardiology

## 2014-06-10 ENCOUNTER — Encounter: Payer: Self-pay | Admitting: Cardiology

## 2014-06-10 ENCOUNTER — Ambulatory Visit (INDEPENDENT_AMBULATORY_CARE_PROVIDER_SITE_OTHER): Payer: Medicare Other | Admitting: *Deleted

## 2014-06-10 VITALS — BP 152/64 | HR 83 | Ht 70.0 in | Wt 192.0 lb

## 2014-06-10 DIAGNOSIS — I4891 Unspecified atrial fibrillation: Secondary | ICD-10-CM

## 2014-06-10 DIAGNOSIS — I059 Rheumatic mitral valve disease, unspecified: Secondary | ICD-10-CM

## 2014-06-10 DIAGNOSIS — I639 Cerebral infarction, unspecified: Secondary | ICD-10-CM

## 2014-06-10 DIAGNOSIS — I4729 Other ventricular tachycardia: Secondary | ICD-10-CM

## 2014-06-10 DIAGNOSIS — E785 Hyperlipidemia, unspecified: Secondary | ICD-10-CM

## 2014-06-10 DIAGNOSIS — I5032 Chronic diastolic (congestive) heart failure: Secondary | ICD-10-CM

## 2014-06-10 DIAGNOSIS — Z7901 Long term (current) use of anticoagulants: Secondary | ICD-10-CM

## 2014-06-10 DIAGNOSIS — I6350 Cerebral infarction due to unspecified occlusion or stenosis of unspecified cerebral artery: Secondary | ICD-10-CM

## 2014-06-10 DIAGNOSIS — I4819 Other persistent atrial fibrillation: Secondary | ICD-10-CM

## 2014-06-10 DIAGNOSIS — I10 Essential (primary) hypertension: Secondary | ICD-10-CM

## 2014-06-10 DIAGNOSIS — Z9889 Other specified postprocedural states: Secondary | ICD-10-CM

## 2014-06-10 DIAGNOSIS — I481 Persistent atrial fibrillation: Secondary | ICD-10-CM

## 2014-06-10 DIAGNOSIS — I472 Ventricular tachycardia: Secondary | ICD-10-CM

## 2014-06-10 DIAGNOSIS — G459 Transient cerebral ischemic attack, unspecified: Secondary | ICD-10-CM

## 2014-06-10 DIAGNOSIS — I2510 Atherosclerotic heart disease of native coronary artery without angina pectoris: Secondary | ICD-10-CM

## 2014-06-10 LAB — BASIC METABOLIC PANEL
BUN: 26 mg/dL — ABNORMAL HIGH (ref 6–23)
CO2: 30 mEq/L (ref 19–32)
Calcium: 9.9 mg/dL (ref 8.4–10.5)
Chloride: 100 mEq/L (ref 96–112)
Creatinine, Ser: 1.31 mg/dL (ref 0.40–1.50)
GFR: 56.11 mL/min — ABNORMAL LOW (ref >60.00)
Glucose, Bld: 99 mg/dL (ref 70–99)
POTASSIUM: 4.7 meq/L (ref 3.5–5.1)
SODIUM: 137 meq/L (ref 135–145)

## 2014-06-10 LAB — POCT INR: INR: 3.5

## 2014-06-10 LAB — CBC WITH DIFFERENTIAL/PLATELET
BASOS ABS: 0.1 10*3/uL (ref 0.0–0.1)
BASOS PCT: 0.7 % (ref 0.0–3.0)
EOS ABS: 0.3 10*3/uL (ref 0.0–0.7)
Eosinophils Relative: 3.4 % (ref 0.0–5.0)
HEMATOCRIT: 42.7 % (ref 39.0–52.0)
Hemoglobin: 14.1 g/dL (ref 13.0–17.0)
LYMPHS ABS: 1 10*3/uL (ref 0.7–4.0)
LYMPHS PCT: 11.9 % — AB (ref 12.0–46.0)
MCHC: 33.1 g/dL (ref 30.0–36.0)
MCV: 101.1 fl — ABNORMAL HIGH (ref 78.0–100.0)
MONOS PCT: 9.6 % (ref 3.0–12.0)
Monocytes Absolute: 0.8 10*3/uL (ref 0.1–1.0)
Neutro Abs: 6.1 10*3/uL (ref 1.4–7.7)
Neutrophils Relative %: 74.4 % (ref 43.0–77.0)
Platelets: 198 10*3/uL (ref 150.0–400.0)
RBC: 4.22 Mil/uL (ref 4.22–5.81)
RDW: 16.1 % — AB (ref 11.5–15.5)
WBC: 8.2 10*3/uL (ref 4.0–10.5)

## 2014-06-10 LAB — BRAIN NATRIURETIC PEPTIDE: Pro B Natriuretic peptide (BNP): 249 pg/mL — ABNORMAL HIGH (ref 0.0–100.0)

## 2014-06-10 MED ORDER — SPIRONOLACTONE 25 MG PO TABS: ORAL_TABLET | ORAL | Status: DC

## 2014-06-10 MED ORDER — FUROSEMIDE 40 MG PO TABS
40.0000 mg | ORAL_TABLET | Freq: Two times a day (BID) | ORAL | Status: DC
Start: 2014-06-10 — End: 2014-07-06

## 2014-06-10 NOTE — Progress Notes (Signed)
Patient ID: Anthony Gould, male   DOB: 11-05-1933, 81 y.o.   MRN: [id number] PCP: Dr. Alain Marion  81 yo with history of mechanical mitral valve replacement for severe mitral regurgitation and permanent atrial fibrillation presents for cardiology followup.  Patient has a history of possible TIAs for which he has has been on aspirin and dipyridamole long-term.  Patient had an ETT-myoview in 12/11 with no ischemia or infarction.  This fall, he reported episodes of tachypalpitations.  He ended up being set up for ETT-Cardiolite in 11/14.  Nuclear images showed no evidence for ischemia or infarction, but he had multiple runs of NSVT while on the treadmill.  He did not have lightheadedness with these episodes.  He was started on bisoprolol 2.5 mg daily.  Echo in 11/14 showed EF 50% with mild diffuse hypokinesis, moderately dilated RV with normal function, and normal mechanical mitral valve.  Holter in 11/14 showed occasional PVCs and up to 3 beats NSVT but no long runs.    He has not had any palpitations since that time after starting bisoprolol.  He does not get exertional chest pain.  Recently, he has had more fatigue and dyspnea.  He is short of breath when he bends over.  He is short of breath walking up a hill or carrying a heavy load.  SBP running 140s-160s at home.  He continues to play golf.    Labs (4/12): LDL 70, HDL 59, K 4.9, creatinine 1.0, TSH 6.5 (elevated), LFTs normal Labs (6/13): K 4.5, creatinine 1.0, LDL 60 HDL 51 Labs (6/14): LDL 43, HDL 51 Labs (11/14): K 4.1, creatinine 1.1, TSH normal, HCT 44 Labs (5/15): K 4.4, creatinine 1.1, HCT 44.3, LDL 63, HDL 72  PMH: 1. Atrial fibrillation: Permanent, on coumadin.  He has not been able to tolerate metoprolol due to severe fatigue.  2. DM2: diet-controlled 3. HTN 4. Hyperlipidemia 5. 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  Patient was started on dipyridamole years ago for this.   6. 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  Echo (6/13) with EF 50-55%, mild LVH, mildly dilated LV, mechanical mitral valve functioning normally, biatrial enlargement, PA systolic pressure 38 mmHg.  Echo (11/14) with EF 50%, mild diffuse hypokinesis, normal mechanical mitral valve, moderately dilated RV with normal systolic function.  7. ETT-myoview (12/11): EF 46%, no evidence for ischemia or infarction, there were runs of NSVT reported.  ETT-Cardiolite (11/14): not gated, 5:02 exercise, no ischemia or infarction by perfusion images but had multiple runs of NSVT.  8. Diastolic CHF.  9. Gout 10. NSVT: ETT-Cardiolite in 11/14 with runs of NSVT.  Holter monitor (11/14) showed predominant atrial fibrillation with some PVCs, he had a few run of 3 beats NSVT.   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   FH: Father with MI at 51.  Mother with CVA.   ROS: All systems reviewed and negative except as per HPI.    Current Outpatient Prescriptions  Medication Sig Dispense Refill  . allopurinol (ZYLOPRIM) 300 MG tablet TAKE 1 BY MOUTH DAILY 90 tablet 0  . aspirin 81 MG EC tablet Take 81 mg by mouth daily.      [name] atorvastatin (LIPITOR) 20 MG tablet Take 1 tablet (20 mg total) by mouth daily. 90 tablet 3  . bisoprolol (ZEBETA) 5 MG tablet Take 0.5 tablets (2.5 mg total) by mouth daily. 90 tablet 1  . celecoxib (CELEBREX) 200 MG capsule  Take 200 mg by mouth. One tab once a week    . diltiazem (CARDIZEM CD) 360 MG 24 hr capsule Take 1 capsule (360 mg total) by mouth daily. 90 capsule 3  . dipyridamole (PERSANTINE) 50 MG tablet TAKE 1 BY MOUTH 3 TIMES DAILY 270 tablet 2  . enalapril (VASOTEC) 20 MG tablet Take 1 tablet (20 mg total) by mouth 2 (two) times daily. 60 tablet 0  . furosemide (LASIX) 40 MG tablet Take 1 tablet (40 mg total) by mouth 2 (two) times daily. 180 tablet 3  . magnesium oxide (MAG-OX) 400 MG tablet Take 400 mg by mouth 3 (three) times daily.      .  pantoprazole (PROTONIX) 40 MG tablet Take 1 tablet (40 mg total) by mouth 2 (two) times daily. 180 tablet 3  . spironolactone (ALDACTONE) 25 MG tablet 1/2 tablet (12.5mg ) by mouth daily 45 tablet 3  . warfarin (COUMADIN) 5 MG tablet Take as directed by the Anticoagulation Clinic 135 tablet 1   No current facility-administered medications for this visit.    BP 152/64 mmHg  Pulse 83  Ht 5\' 10"  (1.778 m)  Wt 192 lb (87.091 kg)  BMI 27.55 kg/m2 General: NAD Neck: JVP 12 cm, no thyromegaly or thyroid nodule.  Lungs: Clear to auscultation bilaterally with normal respiratory effort. CV: Nondisplaced PMI.  Heart irregular S1/S2, mechanical S1, no N9/G9, 1/6 systolic murmur along the sternal border.  1+ edema 1/3 up lower legs bilaterally.  No carotid bruit.  Normal pedal pulses.  Abdomen: Soft, nontender, no hepatosplenomegaly, no distention.  Neurologic: Alert and oriented x 3.  Psych: Normal affect. Extremities: No clubbing or cyanosis.   Assessment/Plan:  1. Atrial fibrillation:  Stable on coumadin.  Good rate control.    2. Hypertension:   BP has been elevated.  I do not think he would tolerate increasing bisoprolol.  He is on maximal enalapril and diltiazem CD.  I will start spironolactone 12.5 mg daily with BMET in 10 days.  3.  Medtronic-Hall mechanical mitral valve:  Continue coumadin and ASA 81 mg daily.  Last echo in 11/14 showed well-seated valve.  Goal INR 3-3.5 given TIAs while on coumadin with lower INR goal.  CBC today.  4.  TIA:  History of TIAs while on coumadin.  Has been on dipyridamole long-term with no recurrent TIA symptoms.    5. Hyperlipidemia: Check lipids today.      6. Chronic diastolic CHF: Patient appears volume overloaded on exam, NYHA class II symptoms.  - Repeat echo - Increase Lasix to 40 mg bid.  BMET/BNP in 10 days.  - Cut back on dietary sodium.  7. NSVT: Multiple runs while doing ETT in 11/14.  Looking back, it appears that he also had NSVT on his  ETT-Myoview in 2011.  Perfusion images were normal on both studies.  He did not have chest pain or lightheadedness.  I have started him on bisoprolol, which he has tolerated (had trouble with metoprolol in the past).  Echo showed preserved EF and holter showed occasional PVCs with a couple runs of 3 beats NSVT.   Followup in 6 wks.  Loralie Champagne 06/10/2014

## 2014-06-10 NOTE — Patient Instructions (Addendum)
Increase lasix (furosemide) to 40mg  two times a day.   Start spironolactone 12.5mg  daily-this will be 1/2 of a 25mg  tablet daily  Your physician recommends that you have  lab work today--CBCd/BMET/BNP.  Your physician has requested that you have an echocardiogram. Echocardiography is a painless test that uses sound waves to create images of your heart. It provides your doctor with information about the size and shape of your heart and how well your heart's chambers and valves are working. This procedure takes approximately one hour. There are no restrictions for this procedure.  Your physician recommends that you return for a FASTING lipid profile/BMET/BNP in about 10 days.   Your physician recommends that you schedule a follow-up appointment in: 6 weeks with Dr Aundra Dubin.

## 2014-06-11 ENCOUNTER — Other Ambulatory Visit: Payer: Self-pay | Admitting: *Deleted

## 2014-06-11 DIAGNOSIS — I5032 Chronic diastolic (congestive) heart failure: Secondary | ICD-10-CM

## 2014-06-11 MED ORDER — SPIRONOLACTONE 25 MG PO TABS
ORAL_TABLET | ORAL | Status: DC
Start: 2014-06-11 — End: 2014-07-06

## 2014-06-21 ENCOUNTER — Other Ambulatory Visit (INDEPENDENT_AMBULATORY_CARE_PROVIDER_SITE_OTHER): Payer: Medicare Other | Admitting: *Deleted

## 2014-06-21 ENCOUNTER — Ambulatory Visit (HOSPITAL_COMMUNITY): Payer: Medicare Other | Attending: Internal Medicine | Admitting: Radiology

## 2014-06-21 DIAGNOSIS — I4891 Unspecified atrial fibrillation: Secondary | ICD-10-CM

## 2014-06-21 DIAGNOSIS — I5032 Chronic diastolic (congestive) heart failure: Secondary | ICD-10-CM

## 2014-06-21 DIAGNOSIS — I481 Persistent atrial fibrillation: Secondary | ICD-10-CM | POA: Diagnosis not present

## 2014-06-21 DIAGNOSIS — I059 Rheumatic mitral valve disease, unspecified: Secondary | ICD-10-CM

## 2014-06-21 DIAGNOSIS — I4819 Other persistent atrial fibrillation: Secondary | ICD-10-CM

## 2014-06-21 LAB — BASIC METABOLIC PANEL
BUN: 37 mg/dL — AB (ref 6–23)
CALCIUM: 9.9 mg/dL (ref 8.4–10.5)
CO2: 29 mEq/L (ref 19–32)
Chloride: 102 mEq/L (ref 96–112)
Creatinine, Ser: 1.43 mg/dL (ref 0.40–1.50)
GFR: 50.71 mL/min — AB (ref >60.00)
GLUCOSE: 140 mg/dL — AB (ref 70–99)
Potassium: 4.9 mEq/L (ref 3.5–5.1)
Sodium: 136 mEq/L (ref 135–145)

## 2014-06-21 LAB — LIPID PANEL
CHOLESTEROL: 153 mg/dL (ref 0–200)
HDL: 67.3 mg/dL (ref >39.00)
LDL Cholesterol: 65 mg/dL (ref 0–99)
NonHDL: 85.7
TRIGLYCERIDES: 105 mg/dL (ref 0.0–149.0)
Total CHOL/HDL Ratio: 2
VLDL: 21 mg/dL (ref 0.0–40.0)

## 2014-06-21 LAB — BRAIN NATRIURETIC PEPTIDE: PRO B NATRI PEPTIDE: 198 pg/mL — AB (ref 0.0–100.0)

## 2014-06-21 NOTE — Progress Notes (Signed)
Echocardiogram performed.  

## 2014-06-25 ENCOUNTER — Telehealth: Payer: Self-pay | Admitting: Cardiology

## 2014-06-25 NOTE — Telephone Encounter (Signed)
Pt states he was prescribed dipyridamole after valve replacement surgery years ago.

## 2014-06-25 NOTE — Telephone Encounter (Signed)
Pt states he had blurred vision after valve replacement surgery years ago.

## 2014-06-25 NOTE — Telephone Encounter (Signed)
New message      Pt c/o medication issue:  1. Name of Medication: dipyridamole  2. How are you currently taking this medication (dosage and times per day)?50mg   3. Are you having a reaction (difficulty breathing--STAT)? no 4. What is your medication issue? Need prior approval--pt has 8 days of pills left.  [employer]/Medicare/BCBS

## 2014-06-25 NOTE — Telephone Encounter (Signed)
Anthony Gould at [facility] [phone], states that she will fax prior authorization request form for dipyridamole. She states she is unable to process request over the telephone and will make a one time exception and fax prior authorization request form to Dr Aundra Dubin.   Anthony Gould states in the future BCBS is requiring prior authorization requests  for medications be made through [employer] site or Cover My Meds.

## 2014-06-29 NOTE — Telephone Encounter (Signed)
Pt states his MVR was done at [facility] 12/26/1984.

## 2014-07-05 NOTE — Telephone Encounter (Signed)
Pt advised prior authorization for dipyridamole completed by Dr Aundra Dubin.  Pt advised I will forward to HIM to be faxed to BC/BS.

## 2014-07-06 ENCOUNTER — Ambulatory Visit (INDEPENDENT_AMBULATORY_CARE_PROVIDER_SITE_OTHER): Payer: Medicare Other | Admitting: *Deleted

## 2014-07-06 ENCOUNTER — Other Ambulatory Visit (INDEPENDENT_AMBULATORY_CARE_PROVIDER_SITE_OTHER): Payer: Medicare Other

## 2014-07-06 ENCOUNTER — Other Ambulatory Visit: Payer: Self-pay | Admitting: *Deleted

## 2014-07-06 DIAGNOSIS — I059 Rheumatic mitral valve disease, unspecified: Secondary | ICD-10-CM

## 2014-07-06 DIAGNOSIS — I2510 Atherosclerotic heart disease of native coronary artery without angina pectoris: Secondary | ICD-10-CM | POA: Diagnosis not present

## 2014-07-06 DIAGNOSIS — E785 Hyperlipidemia, unspecified: Secondary | ICD-10-CM

## 2014-07-06 DIAGNOSIS — I4891 Unspecified atrial fibrillation: Secondary | ICD-10-CM | POA: Diagnosis not present

## 2014-07-06 DIAGNOSIS — Z9889 Other specified postprocedural states: Secondary | ICD-10-CM

## 2014-07-06 DIAGNOSIS — I639 Cerebral infarction, unspecified: Secondary | ICD-10-CM

## 2014-07-06 DIAGNOSIS — G459 Transient cerebral ischemic attack, unspecified: Secondary | ICD-10-CM | POA: Diagnosis not present

## 2014-07-06 DIAGNOSIS — I5032 Chronic diastolic (congestive) heart failure: Secondary | ICD-10-CM

## 2014-07-06 DIAGNOSIS — I6350 Cerebral infarction due to unspecified occlusion or stenosis of unspecified cerebral artery: Secondary | ICD-10-CM

## 2014-07-06 DIAGNOSIS — I10 Essential (primary) hypertension: Secondary | ICD-10-CM

## 2014-07-06 DIAGNOSIS — Z7901 Long term (current) use of anticoagulants: Secondary | ICD-10-CM

## 2014-07-06 LAB — POCT INR: INR: 2.3

## 2014-07-06 LAB — BASIC METABOLIC PANEL
BUN: 50 mg/dL — ABNORMAL HIGH (ref 6–23)
CHLORIDE: 99 meq/L (ref 96–112)
CO2: 24 meq/L (ref 19–32)
Calcium: 9.8 mg/dL (ref 8.4–10.5)
Creatinine, Ser: 1.73 mg/dL — ABNORMAL HIGH (ref 0.40–1.50)
GFR: 40.7 mL/min — ABNORMAL LOW (ref >60.00)
Glucose, Bld: 147 mg/dL — ABNORMAL HIGH (ref 70–99)
Potassium: 5.2 mEq/L — ABNORMAL HIGH (ref 3.5–5.1)
Sodium: 129 mEq/L — ABNORMAL LOW (ref 135–145)

## 2014-07-06 NOTE — Addendum Note (Signed)
Addended by: Stephannie Peters on: 07/06/2014 09:03 AM   Modules accepted: Orders

## 2014-07-13 ENCOUNTER — Ambulatory Visit (INDEPENDENT_AMBULATORY_CARE_PROVIDER_SITE_OTHER): Payer: Medicare Other | Admitting: *Deleted

## 2014-07-13 ENCOUNTER — Other Ambulatory Visit (INDEPENDENT_AMBULATORY_CARE_PROVIDER_SITE_OTHER): Payer: Medicare Other | Admitting: *Deleted

## 2014-07-13 DIAGNOSIS — G459 Transient cerebral ischemic attack, unspecified: Secondary | ICD-10-CM | POA: Diagnosis not present

## 2014-07-13 DIAGNOSIS — I639 Cerebral infarction, unspecified: Secondary | ICD-10-CM | POA: Diagnosis not present

## 2014-07-13 DIAGNOSIS — I6350 Cerebral infarction due to unspecified occlusion or stenosis of unspecified cerebral artery: Secondary | ICD-10-CM

## 2014-07-13 DIAGNOSIS — I4891 Unspecified atrial fibrillation: Secondary | ICD-10-CM

## 2014-07-13 DIAGNOSIS — I059 Rheumatic mitral valve disease, unspecified: Secondary | ICD-10-CM

## 2014-07-13 DIAGNOSIS — Z9889 Other specified postprocedural states: Secondary | ICD-10-CM

## 2014-07-13 DIAGNOSIS — I5032 Chronic diastolic (congestive) heart failure: Secondary | ICD-10-CM | POA: Diagnosis not present

## 2014-07-13 DIAGNOSIS — Z7901 Long term (current) use of anticoagulants: Secondary | ICD-10-CM

## 2014-07-13 LAB — BASIC METABOLIC PANEL
BUN: 44 mg/dL — ABNORMAL HIGH (ref 6–23)
CHLORIDE: 102 meq/L (ref 96–112)
CO2: 25 mEq/L (ref 19–32)
Calcium: 9.8 mg/dL (ref 8.4–10.5)
Creatinine, Ser: 1.39 mg/dL (ref 0.40–1.50)
GFR: 52.39 mL/min — AB (ref >60.00)
Glucose, Bld: 116 mg/dL — ABNORMAL HIGH (ref 70–99)
POTASSIUM: 4.9 meq/L (ref 3.5–5.1)
SODIUM: 133 meq/L — AB (ref 135–145)

## 2014-07-13 LAB — POCT INR: INR: 2.6

## 2014-07-19 ENCOUNTER — Telehealth: Payer: Self-pay | Admitting: Cardiology

## 2014-07-19 NOTE — Telephone Encounter (Signed)
New message     [employer] calling has questions regarding prior authorization

## 2014-07-19 NOTE — Telephone Encounter (Signed)
I spoke with Charee L at [phone]. She is stating pt went to get refill on Bisoprolol and it requires prior authorization.  Charee is transferring me to prior authorization since she is in Customer Service and cannot help me.  After being on the telephone for more than 15 minutes I was never connected with anyone and hung up the telephone.

## 2014-07-20 NOTE — Telephone Encounter (Signed)
Spoke with customer service rep at [employer]. Apparently they are needing more clinical information before approving patient's Bisoprolol. i advised them that he was on Metoprolol, but that he could not tolerate it due to severe fatigue. He will forward this on to the nurses for clinical review.

## 2014-07-22 ENCOUNTER — Telehealth: Payer: Self-pay | Admitting: *Deleted

## 2014-07-22 ENCOUNTER — Telehealth: Payer: Self-pay

## 2014-07-22 NOTE — Telephone Encounter (Signed)
S/w Shawna @ [phone] is sending a new form for Bisoprolol. The form I received had the wrong fax number on it. Will receive today.

## 2014-07-22 NOTE — Telephone Encounter (Signed)
Dr Aundra Dubin received notice of approval from [place] [employer] [place] [phone]) of non-formulary exception request for Dipyridamole 25mg -authorization until April 12,2017.

## 2014-07-22 NOTE — Telephone Encounter (Signed)
Prior auth form filled out for Bisoprolol 5 mg sent to [employer]

## 2014-07-26 ENCOUNTER — Telehealth: Payer: Self-pay | Admitting: Cardiology

## 2014-07-26 NOTE — Telephone Encounter (Signed)
New problem    Medication has been approved for 1 year. Will received a letter in mail concerning approval. Please call if any questions.

## 2014-07-28 ENCOUNTER — Encounter: Payer: Self-pay | Admitting: Cardiology

## 2014-07-28 ENCOUNTER — Ambulatory Visit (INDEPENDENT_AMBULATORY_CARE_PROVIDER_SITE_OTHER): Payer: Medicare Other | Admitting: Cardiology

## 2014-07-28 ENCOUNTER — Ambulatory Visit (INDEPENDENT_AMBULATORY_CARE_PROVIDER_SITE_OTHER): Payer: Medicare Other | Admitting: *Deleted

## 2014-07-28 VITALS — BP 152/86 | HR 72 | Ht 70.0 in | Wt 194.0 lb

## 2014-07-28 DIAGNOSIS — G459 Transient cerebral ischemic attack, unspecified: Secondary | ICD-10-CM

## 2014-07-28 DIAGNOSIS — I2510 Atherosclerotic heart disease of native coronary artery without angina pectoris: Secondary | ICD-10-CM | POA: Diagnosis not present

## 2014-07-28 DIAGNOSIS — I6350 Cerebral infarction due to unspecified occlusion or stenosis of unspecified cerebral artery: Secondary | ICD-10-CM

## 2014-07-28 DIAGNOSIS — I482 Chronic atrial fibrillation, unspecified: Secondary | ICD-10-CM

## 2014-07-28 DIAGNOSIS — Z9889 Other specified postprocedural states: Secondary | ICD-10-CM | POA: Diagnosis not present

## 2014-07-28 DIAGNOSIS — I4891 Unspecified atrial fibrillation: Secondary | ICD-10-CM

## 2014-07-28 DIAGNOSIS — I639 Cerebral infarction, unspecified: Secondary | ICD-10-CM

## 2014-07-28 DIAGNOSIS — I5032 Chronic diastolic (congestive) heart failure: Secondary | ICD-10-CM | POA: Diagnosis not present

## 2014-07-28 DIAGNOSIS — I059 Rheumatic mitral valve disease, unspecified: Secondary | ICD-10-CM

## 2014-07-28 DIAGNOSIS — Z7901 Long term (current) use of anticoagulants: Secondary | ICD-10-CM

## 2014-07-28 LAB — POCT INR: INR: 3.4

## 2014-07-28 MED ORDER — FUROSEMIDE 40 MG PO TABS
ORAL_TABLET | ORAL | Status: DC
Start: 2014-07-28 — End: 2015-10-05

## 2014-07-28 NOTE — Patient Instructions (Signed)
Medication Instructions:  Increase lasix (furosemide) to 40mg  in the AM and 20mg  in the PM. This will be 1 of your 40mg  tablets in the AM and 1/2 of your 40mg  tablets in the PM  Labwork: BMET/BNP in 2 weeks.  Testing/Procedures: None today  Follow-Up: Your physician wants you to follow-up in: 6 months with Dr Aundra Dubin. (November 2016).  You will receive a reminder letter in the mail two months in advance. If you don't receive a letter, please call our office to schedule the follow-up appointment.

## 2014-07-29 NOTE — Progress Notes (Signed)
Patient ID: Anthony Gould, male   DOB: [birth year], 81 y.o.   MRN: [id number] PCP: Dr. Alain Marion  81 yo with history of mechanical mitral valve replacement for severe mitral regurgitation and permanent atrial fibrillation presents for cardiology followup.  Patient has a history of possible TIAs for which he has has been on aspirin and dipyridamole long-term.  Patient had an ETT-myoview in 12/11 with no ischemia or infarction.  This fall, he reported episodes of tachypalpitations.  He ended up being set up for ETT-Cardiolite in 11/14.  Nuclear images showed no evidence for ischemia or infarction, but he had multiple runs of NSVT while on the treadmill.  He did not have lightheadedness with these episodes.  He was started on bisoprolol 2.5 mg daily.  Echo in 11/14 showed EF 50% with mild diffuse hypokinesis, moderately dilated RV with normal function, and normal mechanical mitral valve.  Holter in 11/14 showed occasional PVCs and up to 3 beats NSVT but no long runs.    He has not had any palpitations since that time after starting bisoprolol.  He does not get exertional chest pain.  At last appointment, he reported increased exertional dyspnea.  I increased his Lasix to 40 mg bid and added spironolactone for BP control.  He diuresed and felt better, but his creatinine rose up to 1.7 and K went to 5.2.  I decreased Lasix back to 40 daily and stopped spironolactone.  Weight is now rising a bit off the increased Lasix, however he continues to feel good.  No exertional dyspnea now.  No orthopnea or PND.  BP high in office, but SBP running in 120s-140s when he checks at home (brings record with him).    Labs (4/12): LDL 70, HDL 59, K 4.9, creatinine 1.0, TSH 6.5 (elevated), LFTs normal Labs (6/13): K 4.5, creatinine 1.0, LDL 60 HDL 51 Labs (6/14): LDL 43, HDL 51 Labs (11/14): K 4.1, creatinine 1.1, TSH normal, HCT 44 Labs (5/15): K 4.4, creatinine 1.1, HCT 44.3, LDL 63, HDL 72 Labs (3/16): LDL 65, HDl 67, K  5.2 => 4.9, creatinine 1.31 => 1.73 => 1.39  PMH: 1. Atrial fibrillation: Permanent, on coumadin.  He has not been able to tolerate metoprolol due to severe fatigue.  2. DM2: diet-controlled 3. HTN 4. Hyperlipidemia 5. 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  Patient was started on dipyridamole years ago for this.   6. 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  Echo (6/13) with EF 50-55%, mild LVH, mildly dilated LV, mechanical mitral valve functioning normally, biatrial enlargement, PA systolic pressure 38 mmHg.  Echo (11/14) with EF 50%, mild diffuse hypokinesis, normal mechanical mitral valve, moderately dilated RV with normal systolic function. Echo (3/16) with mild LVH, EF 50-55%, mechanical mitral valve with mean gradient 5 mmHg, moderately dilated RV with normal systolic function, severe LAE, moderate TR, PASP 46 mmHg.  7. ETT-myoview (12/11): EF 46%, no evidence for ischemia or infarction, there were runs of NSVT reported.  ETT-Cardiolite (11/14): not gated, 5:02 exercise, no ischemia or infarction by perfusion images but had multiple runs of NSVT.  8. Diastolic CHF.  9. Gout 10. NSVT: ETT-Cardiolite in 11/14 with runs of NSVT.  Holter monitor (11/14) showed predominant atrial fibrillation with some PVCs, he had a few run of 3 beats NSVT.   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   FH: Father with MI at 50.  Mother with CVA.  ROS: All systems reviewed and negative except as per HPI.    Current Outpatient Prescriptions  Medication Sig Dispense Refill  . allopurinol (ZYLOPRIM) 300 MG tablet TAKE 1 BY MOUTH DAILY 90 tablet 0  . aspirin 81 MG EC tablet Take 81 mg by mouth daily.      [name] atorvastatin (LIPITOR) 20 MG tablet Take 1 tablet (20 mg total) by mouth daily. 90 tablet 3  . bisoprolol (ZEBETA) 5 MG tablet Take 0.5 tablets (2.5 mg total) by mouth daily. 90 tablet 1  . celecoxib (CELEBREX)  200 MG capsule Take 200 mg by mouth. One tab once a week    . diltiazem (CARDIZEM CD) 360 MG 24 hr capsule Take 1 capsule (360 mg total) by mouth daily. 90 capsule 3  . dipyridamole (PERSANTINE) 50 MG tablet TAKE 1 BY MOUTH 3 TIMES DAILY 270 tablet 2  . enalapril (VASOTEC) 20 MG tablet Take 1 tablet (20 mg total) by mouth 2 (two) times daily. 60 tablet 0  . magnesium oxide (MAG-OX) 400 MG tablet Take 400 mg by mouth 3 (three) times daily.      [name] warfarin (COUMADIN) 5 MG tablet Take as directed by the Anticoagulation Clinic 135 tablet 1  . furosemide (LASIX) 40 MG tablet 1 tablet (40mg ) by mouth in the AM and 1/2 tablet (20mg ) by mouth in the PM 135 tablet 3   No current facility-administered medications for this visit.    BP 152/86 mmHg  Pulse 72  Ht 5\' 10"  (1.778 m)  Wt 194 lb (87.998 kg)  BMI 27.84 kg/m2 General: NAD Neck: JVP 9-10 cm, no thyromegaly or thyroid nodule.  Lungs: Clear to auscultation bilaterally with normal respiratory effort. CV: Nondisplaced PMI.  Heart irregular S1/S2, mechanical S1, no Z3/G9, 1/6 systolic murmur along the sternal border.  1+ ankle edema.  No carotid bruit.  Normal pedal pulses.  Abdomen: Soft, nontender, no hepatosplenomegaly, no distention.  Neurologic: Alert and oriented x 3.  Psych: Normal affect. Extremities: No clubbing or cyanosis.   Assessment/Plan:  1. Atrial fibrillation:  Stable on coumadin.  Good rate control.    2. Hypertension:   BP is doing ok at home.  Continue current diltiazem CD, bisoprolol, and enalapril.   3. Medtronic-Hall mechanical mitral valve:  Continue coumadin and ASA 81 mg daily.  Last echo in 11/14 showed well-seated valve.  Goal INR 3-3.5 given TIAs while on coumadin with lower INR goal.   4. TIA:  History of TIAs while on coumadin.  Has been on dipyridamole long-term with no recurrent TIA symptoms.    5. Hyperlipidemia: Recent lipids at goal.      6. Chronic diastolic CHF: Patient remains volume overloaded on exam,  NYHA class II symptoms. 3/16 echo stable compared to the past.  - Increase Lasix to 40 qam, 20 qpm with BMET in 10 days.   - Cut back on dietary sodium.  7. NSVT: Multiple runs while doing ETT in 11/14.  Looking back, it appears that he also had NSVT on his ETT-Myoview in 2011.  Perfusion images were normal on both studies.  He did not have chest pain or lightheadedness.  I have started him on bisoprolol, which he has tolerated (had trouble with metoprolol in the past).  Echo showed preserved EF and holter showed occasional PVCs with a couple runs of 3 beats NSVT.    Loralie Champagne 07/29/2014

## 2014-08-09 ENCOUNTER — Other Ambulatory Visit: Payer: Medicare Other

## 2014-08-10 ENCOUNTER — Other Ambulatory Visit (INDEPENDENT_AMBULATORY_CARE_PROVIDER_SITE_OTHER): Payer: Medicare Other

## 2014-08-10 DIAGNOSIS — I5032 Chronic diastolic (congestive) heart failure: Secondary | ICD-10-CM | POA: Diagnosis not present

## 2014-08-10 LAB — BASIC METABOLIC PANEL
BUN: 29 mg/dL — ABNORMAL HIGH (ref 6–23)
CALCIUM: 10 mg/dL (ref 8.4–10.5)
CO2: 30 meq/L (ref 19–32)
Chloride: 99 mEq/L (ref 96–112)
Creatinine, Ser: 1.4 mg/dL (ref 0.40–1.50)
GFR: 51.95 mL/min — ABNORMAL LOW (ref >60.00)
GLUCOSE: 80 mg/dL (ref 70–99)
Potassium: 4.9 mEq/L (ref 3.5–5.1)
Sodium: 134 mEq/L — ABNORMAL LOW (ref 135–145)

## 2014-08-10 LAB — BRAIN NATRIURETIC PEPTIDE: Pro B Natriuretic peptide (BNP): 257 pg/mL — ABNORMAL HIGH (ref 0.0–100.0)

## 2014-08-18 ENCOUNTER — Ambulatory Visit (INDEPENDENT_AMBULATORY_CARE_PROVIDER_SITE_OTHER): Payer: Medicare Other | Admitting: *Deleted

## 2014-08-18 DIAGNOSIS — I4891 Unspecified atrial fibrillation: Secondary | ICD-10-CM

## 2014-08-18 DIAGNOSIS — I059 Rheumatic mitral valve disease, unspecified: Secondary | ICD-10-CM

## 2014-08-18 DIAGNOSIS — Z9889 Other specified postprocedural states: Secondary | ICD-10-CM

## 2014-08-18 DIAGNOSIS — I6350 Cerebral infarction due to unspecified occlusion or stenosis of unspecified cerebral artery: Secondary | ICD-10-CM

## 2014-08-18 DIAGNOSIS — I639 Cerebral infarction, unspecified: Secondary | ICD-10-CM | POA: Diagnosis not present

## 2014-08-18 DIAGNOSIS — Z7901 Long term (current) use of anticoagulants: Secondary | ICD-10-CM

## 2014-08-18 DIAGNOSIS — G459 Transient cerebral ischemic attack, unspecified: Secondary | ICD-10-CM | POA: Diagnosis not present

## 2014-08-18 LAB — POCT INR: INR: 2.5

## 2014-08-30 ENCOUNTER — Ambulatory Visit (INDEPENDENT_AMBULATORY_CARE_PROVIDER_SITE_OTHER): Payer: Medicare Other

## 2014-08-30 DIAGNOSIS — G459 Transient cerebral ischemic attack, unspecified: Secondary | ICD-10-CM

## 2014-08-30 DIAGNOSIS — Z9889 Other specified postprocedural states: Secondary | ICD-10-CM | POA: Diagnosis not present

## 2014-08-30 DIAGNOSIS — I059 Rheumatic mitral valve disease, unspecified: Secondary | ICD-10-CM

## 2014-08-30 DIAGNOSIS — Z7901 Long term (current) use of anticoagulants: Secondary | ICD-10-CM

## 2014-08-30 DIAGNOSIS — I639 Cerebral infarction, unspecified: Secondary | ICD-10-CM

## 2014-08-30 DIAGNOSIS — I6350 Cerebral infarction due to unspecified occlusion or stenosis of unspecified cerebral artery: Secondary | ICD-10-CM

## 2014-08-30 DIAGNOSIS — I4891 Unspecified atrial fibrillation: Secondary | ICD-10-CM | POA: Diagnosis not present

## 2014-08-30 LAB — POCT INR: INR: 2.9

## 2014-08-31 ENCOUNTER — Other Ambulatory Visit: Payer: Self-pay | Admitting: Cardiology

## 2014-09-20 ENCOUNTER — Ambulatory Visit (INDEPENDENT_AMBULATORY_CARE_PROVIDER_SITE_OTHER): Payer: Medicare Other | Admitting: *Deleted

## 2014-09-20 DIAGNOSIS — I639 Cerebral infarction, unspecified: Secondary | ICD-10-CM

## 2014-09-20 DIAGNOSIS — G459 Transient cerebral ischemic attack, unspecified: Secondary | ICD-10-CM | POA: Diagnosis not present

## 2014-09-20 DIAGNOSIS — I4891 Unspecified atrial fibrillation: Secondary | ICD-10-CM | POA: Diagnosis not present

## 2014-09-20 DIAGNOSIS — I6350 Cerebral infarction due to unspecified occlusion or stenosis of unspecified cerebral artery: Secondary | ICD-10-CM

## 2014-09-20 DIAGNOSIS — I059 Rheumatic mitral valve disease, unspecified: Secondary | ICD-10-CM

## 2014-09-20 DIAGNOSIS — Z9889 Other specified postprocedural states: Secondary | ICD-10-CM | POA: Diagnosis not present

## 2014-09-20 DIAGNOSIS — Z7901 Long term (current) use of anticoagulants: Secondary | ICD-10-CM

## 2014-09-20 LAB — POCT INR: INR: 3.4

## 2014-09-28 ENCOUNTER — Other Ambulatory Visit: Payer: Self-pay | Admitting: Internal Medicine

## 2014-10-18 ENCOUNTER — Ambulatory Visit (INDEPENDENT_AMBULATORY_CARE_PROVIDER_SITE_OTHER): Payer: Medicare Other | Admitting: Pharmacist

## 2014-10-18 DIAGNOSIS — G459 Transient cerebral ischemic attack, unspecified: Secondary | ICD-10-CM

## 2014-10-18 DIAGNOSIS — Z7901 Long term (current) use of anticoagulants: Secondary | ICD-10-CM

## 2014-10-18 DIAGNOSIS — I6350 Cerebral infarction due to unspecified occlusion or stenosis of unspecified cerebral artery: Secondary | ICD-10-CM

## 2014-10-18 DIAGNOSIS — I4891 Unspecified atrial fibrillation: Secondary | ICD-10-CM | POA: Diagnosis not present

## 2014-10-18 DIAGNOSIS — Z9889 Other specified postprocedural states: Secondary | ICD-10-CM | POA: Diagnosis not present

## 2014-10-18 DIAGNOSIS — I059 Rheumatic mitral valve disease, unspecified: Secondary | ICD-10-CM

## 2014-10-18 DIAGNOSIS — I639 Cerebral infarction, unspecified: Secondary | ICD-10-CM | POA: Diagnosis not present

## 2014-10-18 LAB — POCT INR: INR: 3.6

## 2014-10-20 ENCOUNTER — Ambulatory Visit (INDEPENDENT_AMBULATORY_CARE_PROVIDER_SITE_OTHER): Payer: Medicare Other | Admitting: Internal Medicine

## 2014-10-20 ENCOUNTER — Encounter: Payer: Self-pay | Admitting: Internal Medicine

## 2014-10-20 ENCOUNTER — Other Ambulatory Visit (INDEPENDENT_AMBULATORY_CARE_PROVIDER_SITE_OTHER): Payer: Medicare Other

## 2014-10-20 VITALS — BP 178/80 | HR 77 | Wt 192.0 lb

## 2014-10-20 DIAGNOSIS — I10 Essential (primary) hypertension: Secondary | ICD-10-CM | POA: Diagnosis not present

## 2014-10-20 DIAGNOSIS — I5032 Chronic diastolic (congestive) heart failure: Secondary | ICD-10-CM

## 2014-10-20 DIAGNOSIS — I6350 Cerebral infarction due to unspecified occlusion or stenosis of unspecified cerebral artery: Secondary | ICD-10-CM

## 2014-10-20 DIAGNOSIS — I639 Cerebral infarction, unspecified: Secondary | ICD-10-CM

## 2014-10-20 DIAGNOSIS — N320 Bladder-neck obstruction: Secondary | ICD-10-CM

## 2014-10-20 DIAGNOSIS — I2510 Atherosclerotic heart disease of native coronary artery without angina pectoris: Secondary | ICD-10-CM | POA: Diagnosis not present

## 2014-10-20 DIAGNOSIS — E1159 Type 2 diabetes mellitus with other circulatory complications: Secondary | ICD-10-CM

## 2014-10-20 DIAGNOSIS — E785 Hyperlipidemia, unspecified: Secondary | ICD-10-CM

## 2014-10-20 DIAGNOSIS — L570 Actinic keratosis: Secondary | ICD-10-CM

## 2014-10-20 LAB — HEPATIC FUNCTION PANEL
ALK PHOS: 75 U/L (ref 39–117)
ALT: 18 U/L (ref 0–53)
AST: 26 U/L (ref 0–37)
Albumin: 4.7 g/dL (ref 3.5–5.2)
Bilirubin, Direct: 0.3 mg/dL (ref 0.0–0.3)
TOTAL PROTEIN: 8 g/dL (ref 6.0–8.3)
Total Bilirubin: 0.9 mg/dL (ref 0.2–1.2)

## 2014-10-20 LAB — URINALYSIS, ROUTINE W REFLEX MICROSCOPIC
Bilirubin Urine: NEGATIVE
KETONES UR: NEGATIVE
Leukocytes, UA: NEGATIVE
NITRITE: NEGATIVE
PH: 5.5 (ref 5.0–8.0)
Specific Gravity, Urine: <=1.005 — AB (ref 1.000–1.030)
Total Protein, Urine: NEGATIVE
UROBILINOGEN UA: 0.2 (ref 0.0–1.0)
Urine Glucose: NEGATIVE
WBC, UA: NONE SEEN (ref 0–?)

## 2014-10-20 LAB — LIPID PANEL
Cholesterol: 150 mg/dL (ref 0–200)
HDL: 70.4 mg/dL (ref >39.00)
LDL Cholesterol: 62 mg/dL (ref 0–99)
NONHDL: 79.6
Total CHOL/HDL Ratio: 2
Triglycerides: 87 mg/dL (ref 0.0–149.0)
VLDL: 17.4 mg/dL (ref 0.0–40.0)

## 2014-10-20 LAB — CBC WITH DIFFERENTIAL/PLATELET
BASOS ABS: 0 10*3/uL (ref 0.0–0.1)
Basophils Relative: 0.3 % (ref 0.0–3.0)
EOS ABS: 0.3 10*3/uL (ref 0.0–0.7)
EOS PCT: 3.6 % (ref 0.0–5.0)
HEMATOCRIT: 44 % (ref 39.0–52.0)
HEMOGLOBIN: 14.6 g/dL (ref 13.0–17.0)
Lymphocytes Relative: 18.1 % (ref 12.0–46.0)
Lymphs Abs: 1.3 10*3/uL (ref 0.7–4.0)
MCHC: 33.2 g/dL (ref 30.0–36.0)
MCV: 101.3 fl — ABNORMAL HIGH (ref 78.0–100.0)
MONO ABS: 0.6 10*3/uL (ref 0.1–1.0)
Monocytes Relative: 9 % (ref 3.0–12.0)
Neutro Abs: 4.9 10*3/uL (ref 1.4–7.7)
Neutrophils Relative %: 69 % (ref 43.0–77.0)
Platelets: 203 10*3/uL (ref 150.0–400.0)
RBC: 4.34 Mil/uL (ref 4.22–5.81)
RDW: 14.5 % (ref 11.5–15.5)
WBC: 7.1 10*3/uL (ref 4.0–10.5)

## 2014-10-20 LAB — HEMOGLOBIN A1C: Hgb A1c MFr Bld: 5.6 % (ref 4.6–6.5)

## 2014-10-20 LAB — BASIC METABOLIC PANEL
BUN: 25 mg/dL — ABNORMAL HIGH (ref 6–23)
CHLORIDE: 97 meq/L (ref 96–112)
CO2: 27 mEq/L (ref 19–32)
CREATININE: 1.2 mg/dL (ref 0.40–1.50)
Calcium: 9.9 mg/dL (ref 8.4–10.5)
GFR: 62.03 mL/min (ref >60.00)
GLUCOSE: 133 mg/dL — AB (ref 70–99)
Potassium: 5.1 mEq/L (ref 3.5–5.1)
SODIUM: 133 meq/L — AB (ref 135–145)

## 2014-10-20 LAB — TSH: TSH: 6.69 u[IU]/mL — AB (ref 0.35–4.50)

## 2014-10-20 LAB — PSA: PSA: 0.87 ng/mL (ref 0.10–4.00)

## 2014-10-20 LAB — T4, FREE: FREE T4: 0.96 ng/dL (ref 0.60–1.60)

## 2014-10-20 MED ORDER — VITAMIN D 1000 UNITS PO TABS: 1000.0000 [IU] | ORAL_TABLET | Freq: Every day | ORAL | Status: DC

## 2014-10-20 MED ORDER — ALLOPURINOL 300 MG PO TABS: 300.0000 mg | ORAL_TABLET | Freq: Every day | ORAL | Status: DC | PRN

## 2014-10-20 NOTE — Progress Notes (Signed)
Subjective:  Patient ID: Anthony Gould, male    DOB: [birth year]  Age: 81 y.o. MRN: [id number]  CC: No chief complaint on file.   HPI [name] presents for TIA, CHF, DM f/u  Outpatient Prescriptions Prior to Visit  Medication Sig Dispense Refill  . aspirin 81 MG EC tablet Take 81 mg by mouth daily.      [name] atorvastatin (LIPITOR) 20 MG tablet Take 1 tablet (20 mg total) by mouth daily. 90 tablet 3  . bisoprolol (ZEBETA) 5 MG tablet Take 0.5 tablets (2.5 mg total) by mouth daily. 90 tablet 1  . celecoxib (CELEBREX) 200 MG capsule Take 200 mg by mouth. One tab once a week    . diltiazem (CARDIZEM CD) 360 MG 24 hr capsule TAKE 1 BY MOUTH DAILY 90 capsule 1  . dipyridamole (PERSANTINE) 50 MG tablet TAKE 1 BY MOUTH 3 TIMES DAILY 270 tablet 2  . enalapril (VASOTEC) 20 MG tablet Take 1 tablet (20 mg total) by mouth 2 (two) times daily. 60 tablet 0  . furosemide (LASIX) 40 MG tablet 1 tablet (40mg ) by mouth in the AM and 1/2 tablet (20mg ) by mouth in the PM 135 tablet 3  . magnesium oxide (MAG-OX) 400 MG tablet Take 400 mg by mouth 3 (three) times daily.      [name] warfarin (COUMADIN) 5 MG tablet Take as directed by the Anticoagulation Clinic 135 tablet 1  . allopurinol (ZYLOPRIM) 300 MG tablet Take 1 tablet (300 mg total) by mouth daily as needed. 30 tablet 0   No facility-administered medications prior to visit.    ROS Review of Systems  Constitutional: Negative for appetite change, fatigue and unexpected weight change.  HENT: Negative for congestion, nosebleeds, sneezing, sore throat and trouble swallowing.   Eyes: Negative for itching and visual disturbance.  Respiratory: Negative for cough.   Cardiovascular: Negative for chest pain, palpitations and leg swelling.  Gastrointestinal: Negative for nausea, diarrhea, blood in stool and abdominal distention.  Genitourinary: Negative for frequency and hematuria.  Musculoskeletal: Negative for back pain, joint swelling, gait problem  and neck pain.  Skin: Positive for rash.  Neurological: Negative for dizziness, tremors, speech difficulty and weakness.  Psychiatric/Behavioral: Negative for sleep disturbance, dysphoric mood and agitation. The patient is not nervous/anxious.     Objective:  BP 178/80 mmHg  Pulse 77  Wt 192 lb (87.091 kg)  SpO2 94%  BP Readings from Last 3 Encounters:  10/20/14 178/80  07/28/14 152/86  06/10/14 152/64    Wt Readings from Last 3 Encounters:  10/20/14 192 lb (87.091 kg)  07/28/14 194 lb (87.998 kg)  06/10/14 192 lb (87.091 kg)    Physical Exam  Constitutional: He is oriented to person, place, and time. He appears well-developed. No distress.  NAD  HENT:  Mouth/Throat: Oropharynx is clear and moist.  Eyes: Conjunctivae are normal. Pupils are equal, round, and reactive to light.  Neck: Normal range of motion. No JVD present. No thyromegaly present.  Cardiovascular: Normal rate, regular rhythm, normal heart sounds and intact distal pulses.  Exam reveals no gallop and no friction rub.   No murmur heard. Pulmonary/Chest: Effort normal and breath sounds normal. No respiratory distress. He has no wheezes. He has no rales. He exhibits no tenderness.  Abdominal: Soft. Bowel sounds are normal. He exhibits no distension and no mass. There is no tenderness. There is no rebound and no guarding.  Musculoskeletal: Normal range of motion. He exhibits no edema or tenderness.  Lymphadenopathy:    He has no cervical adenopathy.  Neurological: He is alert and oriented to person, place, and time. He has normal reflexes. No cranial nerve deficit. He exhibits normal muscle tone. He displays a negative Romberg sign. Coordination and gait normal.  Skin: Skin is warm and dry. No rash noted.  Psychiatric: He has a normal mood and affect. His behavior is normal. Judgment and thought content normal.   AKs Lab Results  Component Value Date   WBC 8.2 06/10/2014   HGB 14.1 06/10/2014   HCT 42.7  06/10/2014   PLT 198.0 06/10/2014   GLUCOSE 80 08/10/2014   CHOL 153 06/21/2014   TRIG 105.0 06/21/2014   HDL 67.30 06/21/2014   LDLCALC 65 06/21/2014   ALT 18 08/07/2013   AST 29 08/07/2013   NA 134* 08/10/2014   K 4.9 08/10/2014   CL 99 08/10/2014   CREATININE 1.40 08/10/2014   BUN 29* 08/10/2014   CO2 30 08/10/2014   TSH 6.16* 08/07/2013   INR 3.6 10/18/2014   HGBA1C 6.0 08/07/2013    No results found.  Assessment & Plan:   Diagnoses and all orders for this visit:  Atherosclerosis of native coronary artery of native heart without angina pectoris Orders: -     TSH; Future -     T4, free; Future -     Urinalysis; Future -     PSA; Future -     Lipid panel; Future -     Basic metabolic panel; Future -     CBC with Differential/Platelet; Future -     Hemoglobin A1c; Future -     Hepatic function panel; Future  Essential hypertension Orders: -     TSH; Future -     T4, free; Future -     Urinalysis; Future -     PSA; Future -     Lipid panel; Future -     Basic metabolic panel; Future -     CBC with Differential/Platelet; Future -     Hemoglobin A1c; Future -     Hepatic function panel; Future  Cerebral artery occlusion with cerebral infarction Orders: -     TSH; Future -     T4, free; Future -     Urinalysis; Future -     PSA; Future -     Lipid panel; Future -     Basic metabolic panel; Future -     CBC with Differential/Platelet; Future -     Hemoglobin A1c; Future -     Hepatic function panel; Future  Chronic diastolic CHF (congestive heart failure) Orders: -     TSH; Future -     T4, free; Future -     Urinalysis; Future -     PSA; Future -     Lipid panel; Future -     Basic metabolic panel; Future -     CBC with Differential/Platelet; Future -     Hemoglobin A1c; Future -     Hepatic function panel; Future  Type 2 diabetes mellitus with other circulatory complications Orders: -     TSH; Future -     T4, free; Future -     Urinalysis;  Future -     PSA; Future -     Lipid panel; Future -     Basic metabolic panel; Future -     CBC with Differential/Platelet; Future -     Hemoglobin A1c; Future -  Hepatic function panel; Future  Hyperlipidemia Orders: -     TSH; Future -     T4, free; Future -     Urinalysis; Future -     PSA; Future -     Lipid panel; Future -     Basic metabolic panel; Future -     CBC with Differential/Platelet; Future -     Hemoglobin A1c; Future -     Hepatic function panel; Future  Bladder neck obstruction Orders: -     PSA; Future  Other orders -     allopurinol (ZYLOPRIM) 300 MG tablet; Take 1 tablet (300 mg total) by mouth daily as needed.  I am having Mr. Brusseau maintain his aspirin, magnesium oxide, celecoxib, bisoprolol, atorvastatin, enalapril, dipyridamole, warfarin, furosemide, diltiazem, and allopurinol.  Meds ordered this encounter  Medications  . allopurinol (ZYLOPRIM) 300 MG tablet    Sig: Take 1 tablet (300 mg total) by mouth daily as needed.    Dispense:  90 tablet    Refill:  3     Follow-up: No Follow-up on file.  Walker Kehr, MD

## 2014-10-20 NOTE — Assessment & Plan Note (Signed)
Chronic  SBP 130-150 at home - OK per Dr Aundra Dubin On Diltiazem, Bisoprolol, Enalapril, Furosemide

## 2014-10-20 NOTE — Progress Notes (Signed)
Pre visit review using our clinic review tool, if applicable. No additional management support is needed unless otherwise documented below in the visit note. 

## 2014-10-20 NOTE — Assessment & Plan Note (Signed)
Compensated SBP 130-150 at home - OK per Dr Aundra Dubin On Diltiazem, Bisoprolol, Enalapril, Furosemide, Coumadin, ASA

## 2014-10-20 NOTE — Assessment & Plan Note (Signed)
Cryo or derm ref suggested

## 2014-10-20 NOTE — Assessment & Plan Note (Signed)
Chronic  ON DIET  Labs

## 2014-10-20 NOTE — Assessment & Plan Note (Signed)
labs

## 2014-10-20 NOTE — Assessment & Plan Note (Signed)
Remote SBP 130-150 at home - OK per Dr Aundra Dubin On Diltiazem, Bisoprolol, Enalapril, Furosemide, Coumadin, ASA

## 2014-10-20 NOTE — Assessment & Plan Note (Signed)
Chronic  ASA, diltiazem, Lipitor, Bisoprolol

## 2014-11-15 ENCOUNTER — Ambulatory Visit (INDEPENDENT_AMBULATORY_CARE_PROVIDER_SITE_OTHER): Payer: Medicare Other | Admitting: *Deleted

## 2014-11-15 DIAGNOSIS — I4891 Unspecified atrial fibrillation: Secondary | ICD-10-CM | POA: Diagnosis not present

## 2014-11-15 DIAGNOSIS — I6350 Cerebral infarction due to unspecified occlusion or stenosis of unspecified cerebral artery: Secondary | ICD-10-CM

## 2014-11-15 DIAGNOSIS — Z7901 Long term (current) use of anticoagulants: Secondary | ICD-10-CM

## 2014-11-15 DIAGNOSIS — Z9889 Other specified postprocedural states: Secondary | ICD-10-CM

## 2014-11-15 DIAGNOSIS — I059 Rheumatic mitral valve disease, unspecified: Secondary | ICD-10-CM

## 2014-11-15 DIAGNOSIS — I639 Cerebral infarction, unspecified: Secondary | ICD-10-CM

## 2014-11-15 DIAGNOSIS — G459 Transient cerebral ischemic attack, unspecified: Secondary | ICD-10-CM

## 2014-11-15 LAB — POCT INR: INR: 3.9

## 2014-11-18 ENCOUNTER — Other Ambulatory Visit: Payer: Self-pay | Admitting: Internal Medicine

## 2014-11-18 ENCOUNTER — Other Ambulatory Visit: Payer: Self-pay | Admitting: Cardiology

## 2014-11-19 ENCOUNTER — Other Ambulatory Visit: Payer: Self-pay

## 2014-11-19 MED ORDER — WARFARIN SODIUM 5 MG PO TABS: ORAL_TABLET | ORAL | Status: DC

## 2014-11-19 MED ORDER — ATORVASTATIN CALCIUM 20 MG PO TABS: 20.0000 mg | ORAL_TABLET | Freq: Every day | ORAL | Status: DC

## 2014-11-19 NOTE — Telephone Encounter (Signed)
Medication Detail      Disp Refills Start End     bisoprolol (ZEBETA) 5 MG tablet 45 tablet 3 11/18/2014     Sig: TAKE 1/2 BY MOUTH DAILY    E-Prescribing Status: Receipt confirmed by pharmacy (11/18/2014 5:50 PM EDT)      Medication Detail      Disp Refills Start End     dipyridamole (PERSANTINE) 50 MG tablet 270 tablet 0 11/18/2014     Sig: TAKE 1 BY MOUTH 3 TIMES DAILY    E-Prescribing Status: Receipt confirmed by pharmacy (11/18/2014 5:50 PM EDT)     Pharmacy    [facility] [place], [place]

## 2014-12-06 ENCOUNTER — Ambulatory Visit (INDEPENDENT_AMBULATORY_CARE_PROVIDER_SITE_OTHER): Payer: Medicare Other | Admitting: *Deleted

## 2014-12-06 DIAGNOSIS — I6350 Cerebral infarction due to unspecified occlusion or stenosis of unspecified cerebral artery: Secondary | ICD-10-CM

## 2014-12-06 DIAGNOSIS — Z9889 Other specified postprocedural states: Secondary | ICD-10-CM | POA: Diagnosis not present

## 2014-12-06 DIAGNOSIS — I059 Rheumatic mitral valve disease, unspecified: Secondary | ICD-10-CM

## 2014-12-06 DIAGNOSIS — Z7901 Long term (current) use of anticoagulants: Secondary | ICD-10-CM

## 2014-12-06 DIAGNOSIS — I4891 Unspecified atrial fibrillation: Secondary | ICD-10-CM

## 2014-12-06 DIAGNOSIS — G459 Transient cerebral ischemic attack, unspecified: Secondary | ICD-10-CM | POA: Diagnosis not present

## 2014-12-06 DIAGNOSIS — I639 Cerebral infarction, unspecified: Secondary | ICD-10-CM | POA: Diagnosis not present

## 2014-12-06 LAB — POCT INR: INR: 2.8

## 2014-12-27 ENCOUNTER — Ambulatory Visit (INDEPENDENT_AMBULATORY_CARE_PROVIDER_SITE_OTHER): Payer: Medicare Other

## 2014-12-27 DIAGNOSIS — Z7901 Long term (current) use of anticoagulants: Secondary | ICD-10-CM

## 2014-12-27 DIAGNOSIS — I6350 Cerebral infarction due to unspecified occlusion or stenosis of unspecified cerebral artery: Secondary | ICD-10-CM | POA: Diagnosis not present

## 2014-12-27 DIAGNOSIS — I4891 Unspecified atrial fibrillation: Secondary | ICD-10-CM

## 2014-12-27 DIAGNOSIS — I482 Chronic atrial fibrillation, unspecified: Secondary | ICD-10-CM

## 2014-12-27 DIAGNOSIS — Z9889 Other specified postprocedural states: Secondary | ICD-10-CM

## 2014-12-27 DIAGNOSIS — I059 Rheumatic mitral valve disease, unspecified: Secondary | ICD-10-CM

## 2014-12-27 DIAGNOSIS — G459 Transient cerebral ischemic attack, unspecified: Secondary | ICD-10-CM

## 2014-12-27 LAB — POCT INR: INR: 4.3

## 2015-01-12 ENCOUNTER — Other Ambulatory Visit: Payer: Self-pay | Admitting: *Deleted

## 2015-01-12 MED ORDER — PANTOPRAZOLE SODIUM 40 MG PO TBEC: 40.0000 mg | DELAYED_RELEASE_TABLET | Freq: Every day | ORAL | Status: DC

## 2015-01-17 ENCOUNTER — Ambulatory Visit (INDEPENDENT_AMBULATORY_CARE_PROVIDER_SITE_OTHER): Payer: Medicare Other | Admitting: Pharmacist

## 2015-01-17 DIAGNOSIS — I6350 Cerebral infarction due to unspecified occlusion or stenosis of unspecified cerebral artery: Secondary | ICD-10-CM

## 2015-01-17 DIAGNOSIS — Z9889 Other specified postprocedural states: Secondary | ICD-10-CM

## 2015-01-17 DIAGNOSIS — Z23 Encounter for immunization: Secondary | ICD-10-CM

## 2015-01-17 DIAGNOSIS — I482 Chronic atrial fibrillation, unspecified: Secondary | ICD-10-CM

## 2015-01-17 DIAGNOSIS — Z7901 Long term (current) use of anticoagulants: Secondary | ICD-10-CM | POA: Diagnosis not present

## 2015-01-17 DIAGNOSIS — I059 Rheumatic mitral valve disease, unspecified: Secondary | ICD-10-CM

## 2015-01-17 DIAGNOSIS — G459 Transient cerebral ischemic attack, unspecified: Secondary | ICD-10-CM | POA: Diagnosis not present

## 2015-01-17 DIAGNOSIS — I4891 Unspecified atrial fibrillation: Secondary | ICD-10-CM | POA: Diagnosis not present

## 2015-01-17 LAB — POCT INR: INR: 4.9

## 2015-01-31 ENCOUNTER — Ambulatory Visit (INDEPENDENT_AMBULATORY_CARE_PROVIDER_SITE_OTHER): Payer: Medicare Other | Admitting: *Deleted

## 2015-01-31 DIAGNOSIS — G459 Transient cerebral ischemic attack, unspecified: Secondary | ICD-10-CM | POA: Diagnosis not present

## 2015-01-31 DIAGNOSIS — I4891 Unspecified atrial fibrillation: Secondary | ICD-10-CM | POA: Diagnosis not present

## 2015-01-31 DIAGNOSIS — I059 Rheumatic mitral valve disease, unspecified: Secondary | ICD-10-CM

## 2015-01-31 DIAGNOSIS — Z9889 Other specified postprocedural states: Secondary | ICD-10-CM

## 2015-01-31 DIAGNOSIS — Z7901 Long term (current) use of anticoagulants: Secondary | ICD-10-CM | POA: Diagnosis not present

## 2015-01-31 DIAGNOSIS — I6350 Cerebral infarction due to unspecified occlusion or stenosis of unspecified cerebral artery: Secondary | ICD-10-CM

## 2015-01-31 DIAGNOSIS — I482 Chronic atrial fibrillation, unspecified: Secondary | ICD-10-CM

## 2015-01-31 LAB — POCT INR: INR: 3.7

## 2015-02-26 ENCOUNTER — Ambulatory Visit (INDEPENDENT_AMBULATORY_CARE_PROVIDER_SITE_OTHER): Payer: Medicare Other | Admitting: Internal Medicine

## 2015-02-26 VITALS — BP 128/68 | HR 78 | Temp 97.7°F | Ht 70.0 in | Wt 192.8 lb

## 2015-02-26 DIAGNOSIS — H109 Unspecified conjunctivitis: Secondary | ICD-10-CM | POA: Diagnosis not present

## 2015-02-26 MED ORDER — POLYMYXIN B-TRIMETHOPRIM 10000-0.1 UNIT/ML-% OP SOLN: 1.0000 [drp] | OPHTHALMIC | Status: DC

## 2015-02-26 NOTE — Patient Instructions (Signed)
An antibiotic eye drop was sent to your pharmacy.  Follow up with your eye doctor and call over the weekend with any concerns.

## 2015-02-26 NOTE — Progress Notes (Signed)
Pre visit review using our clinic review tool, if applicable. No additional management support is needed unless otherwise documented below in the visit note. 

## 2015-02-26 NOTE — Progress Notes (Signed)
Subjective:    Patient ID: Anthony Gould, male    DOB: [birth year], 81 y.o.   MRN: [id number]  HPI He is here for an acute visit for eye symptoms.   His right eye was very painful two nights ago.  The pain was sudden onset.  The eye was sore if he pushed on the eye.  It was red.  He put some drops in the next day  - contact lens disinfectant.  The eye improved dramatically.  He put it in a few more times throughout the day.  The eye is still red, but it is much better.  He called his eye doctor, but can't get in until next week.  He denies discharge, change in vision, photophobia, itching or feeling like something is in his eye.  Medications and allergies reviewed with patient and updated if appropriate.  Patient Active Problem List   Diagnosis Date Noted  . Actinic keratoses 10/20/2014  . NSVT (nonsustained ventricular tachycardia) ([place]) 02/16/2013  . Routine health maintenance 02/06/2012  . Chronic diastolic CHF (congestive heart failure) ([place]) 09/06/2011  . Long term (current) use of anticoagulants 07/04/2010  . Gout 06/27/2010  . Mitral valve disorder 06/06/2010  . Chest pain 02/28/2010  . TOXIC LABYRINTHITIS 08/04/2009  . HEARING LOSS, LEFT EAR 07/07/2009  . DYSPEPSIA, CHRONIC 06/23/2007  . DM2 (diabetes mellitus, type 2) ([place]) 02/08/2007  . Hyperlipidemia 01/31/2007  . Essential hypertension 01/31/2007  . Coronary atherosclerosis 01/31/2007  . Atrial fibrillation ([place]) 01/31/2007  . Cerebral artery occlusion with cerebral infarction ([place]) 01/31/2007  . TRANSIENT ISCHEMIC ATTACK 01/31/2007  . MITRAL VALVE REPLACEMENT, HX OF 01/31/2007    Current Outpatient Prescriptions on File Prior to Visit  Medication Sig Dispense Refill  . allopurinol (ZYLOPRIM) 300 MG tablet Take 1 tablet (300 mg total) by mouth daily as needed. 90 tablet 3  . aspirin 81 MG EC tablet Take 81 mg by mouth daily.      [name] atorvastatin (LIPITOR) 20 MG tablet TAKE 1 BY MOUTH DAILY 90 tablet 3  .  atorvastatin (LIPITOR) 20 MG tablet Take 1 tablet (20 mg total) by mouth daily. 90 tablet 3  . bisoprolol (ZEBETA) 5 MG tablet TAKE 1/2 BY MOUTH DAILY 45 tablet 3  . celecoxib (CELEBREX) 200 MG capsule Take 200 mg by mouth. One tab once a week    . diltiazem (CARDIZEM CD) 360 MG 24 hr capsule TAKE 1 BY MOUTH DAILY 90 capsule 1  . dipyridamole (PERSANTINE) 50 MG tablet TAKE 1 BY MOUTH 3 TIMES DAILY 270 tablet 0  . enalapril (VASOTEC) 20 MG tablet Take 1 tablet (20 mg total) by mouth 2 (two) times daily. 60 tablet 0  . furosemide (LASIX) 40 MG tablet 1 tablet (40mg ) by mouth in the AM and 1/2 tablet (20mg ) by mouth in the PM 135 tablet 3  . magnesium oxide (MAG-OX) 400 MG tablet Take 400 mg by mouth 3 (three) times daily.      . pantoprazole (PROTONIX) 40 MG tablet Take 1 tablet (40 mg total) by mouth daily. 90 tablet 3  . warfarin (COUMADIN) 5 MG tablet Take as directed by the Anticoagulation Clinic 135 tablet 1   No current facility-administered medications on file prior to visit.    Past Medical History  Diagnosis Date  . Hyperlipidemia   . HTN (hypertension)   . Atrial fibrillation     last echo 11/22/06 - EF 50-55%, mild dilation LV, mild MR w/10mmHg gradient   .  Type II or unspecified type diabetes mellitus without mention of complication, not stated as uncontrolled     lifestyle mangement  . Peripheral edema     venous insufficiency  . Elevated TSH   . TIA (transient ischemic attack)     while on coumadin  . Ventricular ectopy     symptomatic  . Normal nuclear stress test 12/25/01    no ischemia  . Mitral regurgitation     s/p MVR with Medtronic Hall MVR 1986    Past Surgical History  Procedure Laterality Date  . 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 Cholecystectomy, laparoscopic  2003    Social History   Social History  . Marital Status: Married    Spouse Name: N/A  . Number of Children: N/A  . Years of Education: N/A    Occupational History  . Executive in [place] History Main Topics  . Smoking status: Never Smoker   . Smokeless tobacco: Not on file  . Alcohol Use: Yes     Comment: wine  . Drug Use: No  . Sexual Activity: Not on file   Other Topics Concern  . Not on file   Social History Narrative   HSG, many management courses   Married '58   2 sons - '61, '62; 1 grandchild   Nonsmoker          Review of Systems  Constitutional: Negative for fever and chills.  HENT: Negative for congestion, ear pain, sinus pressure and sore throat.   Eyes: Positive for pain and redness. Negative for discharge, itching and visual disturbance.  Respiratory: Negative for cough.   Neurological: Negative for dizziness, light-headedness and headaches.       Objective:   Filed Vitals:   02/26/15 0928  BP: 128/68  Pulse: 78  Temp: 97.7 F (36.5 C)   Filed Weights   02/26/15 0928  Weight: 192 lb 12 oz (87.431 kg)   Body mass index is 27.66 kg/(m^2).   Physical Exam  Constitutional: He appears well-developed and well-nourished. No distress.  HENT:  Head: Normocephalic and atraumatic.  Nose: Nose normal.  Mouth/Throat: Oropharynx is clear and moist. No oropharyngeal exudate.  Eyes: EOM are normal. Pupils are equal, round, and reactive to light. Right eye exhibits no discharge. Left eye exhibits no discharge. No scleral icterus.  Right conjunctiva slightly pink, hypervascular, left conjunctiva normal  Skin: Skin is warm and dry. No rash noted.          Assessment & Plan:   Conjunctivitis, right eye His symptoms are not classic for bacterial conjunctivitis, but given his dramatic improvement to the contact lens disinfectant he may have an bacterial infection Will start an antibiotic eye drop If he has any concerning symptoms over the weekend he will call, but is aware there is not much we will be able to do for him He will follow up with his eye doctor next week

## 2015-02-28 ENCOUNTER — Ambulatory Visit (INDEPENDENT_AMBULATORY_CARE_PROVIDER_SITE_OTHER): Payer: Medicare Other | Admitting: *Deleted

## 2015-02-28 DIAGNOSIS — G459 Transient cerebral ischemic attack, unspecified: Secondary | ICD-10-CM | POA: Diagnosis not present

## 2015-02-28 DIAGNOSIS — I482 Chronic atrial fibrillation, unspecified: Secondary | ICD-10-CM

## 2015-02-28 DIAGNOSIS — I4891 Unspecified atrial fibrillation: Secondary | ICD-10-CM | POA: Diagnosis not present

## 2015-02-28 DIAGNOSIS — Z9889 Other specified postprocedural states: Secondary | ICD-10-CM

## 2015-02-28 DIAGNOSIS — Z7901 Long term (current) use of anticoagulants: Secondary | ICD-10-CM | POA: Diagnosis not present

## 2015-02-28 DIAGNOSIS — I6350 Cerebral infarction due to unspecified occlusion or stenosis of unspecified cerebral artery: Secondary | ICD-10-CM | POA: Diagnosis not present

## 2015-02-28 DIAGNOSIS — I059 Rheumatic mitral valve disease, unspecified: Secondary | ICD-10-CM

## 2015-02-28 LAB — POCT INR: INR: 3.8

## 2015-03-07 ENCOUNTER — Other Ambulatory Visit: Payer: Self-pay | Admitting: Cardiology

## 2015-03-25 ENCOUNTER — Other Ambulatory Visit: Payer: Self-pay | Admitting: Internal Medicine

## 2015-03-28 ENCOUNTER — Other Ambulatory Visit: Payer: Self-pay | Admitting: Internal Medicine

## 2015-03-29 ENCOUNTER — Other Ambulatory Visit: Payer: Self-pay

## 2015-03-29 ENCOUNTER — Other Ambulatory Visit: Payer: Self-pay | Admitting: Internal Medicine

## 2015-04-01 ENCOUNTER — Encounter: Payer: Self-pay | Admitting: Cardiology

## 2015-04-01 ENCOUNTER — Ambulatory Visit (INDEPENDENT_AMBULATORY_CARE_PROVIDER_SITE_OTHER): Payer: Medicare Other

## 2015-04-01 ENCOUNTER — Ambulatory Visit (INDEPENDENT_AMBULATORY_CARE_PROVIDER_SITE_OTHER): Payer: Medicare Other | Admitting: Cardiology

## 2015-04-01 VITALS — BP 118/76 | HR 76 | Ht 70.0 in | Wt 189.8 lb

## 2015-04-01 DIAGNOSIS — Z7901 Long term (current) use of anticoagulants: Secondary | ICD-10-CM

## 2015-04-01 DIAGNOSIS — Z9889 Other specified postprocedural states: Secondary | ICD-10-CM

## 2015-04-01 DIAGNOSIS — I482 Chronic atrial fibrillation, unspecified: Secondary | ICD-10-CM

## 2015-04-01 DIAGNOSIS — I059 Rheumatic mitral valve disease, unspecified: Secondary | ICD-10-CM

## 2015-04-01 DIAGNOSIS — I5032 Chronic diastolic (congestive) heart failure: Secondary | ICD-10-CM

## 2015-04-01 DIAGNOSIS — I4821 Permanent atrial fibrillation: Secondary | ICD-10-CM

## 2015-04-01 DIAGNOSIS — G459 Transient cerebral ischemic attack, unspecified: Secondary | ICD-10-CM | POA: Diagnosis not present

## 2015-04-01 DIAGNOSIS — I4891 Unspecified atrial fibrillation: Secondary | ICD-10-CM

## 2015-04-01 DIAGNOSIS — I2510 Atherosclerotic heart disease of native coronary artery without angina pectoris: Secondary | ICD-10-CM

## 2015-04-01 DIAGNOSIS — I6350 Cerebral infarction due to unspecified occlusion or stenosis of unspecified cerebral artery: Secondary | ICD-10-CM

## 2015-04-01 LAB — CBC WITH DIFFERENTIAL/PLATELET
BASOS ABS: 0.1 10*3/uL (ref 0.0–0.1)
BASOS PCT: 1 % (ref 0–1)
EOS ABS: 0.4 10*3/uL (ref 0.0–0.7)
EOS PCT: 5 % (ref 0–5)
HCT: 40.2 % (ref 39.0–52.0)
Hemoglobin: 14.1 g/dL (ref 13.0–17.0)
LYMPHS ABS: 1.6 10*3/uL (ref 0.7–4.0)
Lymphocytes Relative: 21 % (ref 12–46)
MCH: 33.7 pg (ref 26.0–34.0)
MCHC: 35.1 g/dL (ref 30.0–36.0)
MCV: 96.2 fL (ref 78.0–100.0)
MONOS PCT: 8 % (ref 3–12)
MPV: 9.4 fL (ref 8.6–12.4)
Monocytes Absolute: 0.6 10*3/uL (ref 0.1–1.0)
NEUTROS PCT: 65 % (ref 43–77)
Neutro Abs: 4.9 10*3/uL (ref 1.7–7.7)
PLATELETS: 207 10*3/uL (ref 150–400)
RBC: 4.18 MIL/uL — ABNORMAL LOW (ref 4.22–5.81)
RDW: 14.8 % (ref 11.5–15.5)
WBC: 7.6 10*3/uL (ref 4.0–10.5)

## 2015-04-01 LAB — BASIC METABOLIC PANEL
BUN: 30 mg/dL — ABNORMAL HIGH (ref 7–25)
CO2: 25 mmol/L (ref 20–31)
Calcium: 9.4 mg/dL (ref 8.6–10.3)
Chloride: 97 mmol/L — ABNORMAL LOW (ref 98–110)
Creat: 1.4 mg/dL — ABNORMAL HIGH (ref 0.70–1.18)
Glucose, Bld: 158 mg/dL — ABNORMAL HIGH (ref 65–99)
Potassium: 4 mmol/L (ref 3.5–5.3)
Sodium: 134 mmol/L — ABNORMAL LOW (ref 135–146)

## 2015-04-01 LAB — POCT INR: INR: 3.6

## 2015-04-01 NOTE — Patient Instructions (Signed)
Medication Instructions:  No changes today  Labwork: BMET/CBCd today  Testing/Procedures: None today  Follow-Up: Your physician wants you to follow-up in: 1 year with Dr Aundra Dubin. (January 2018).  You will receive a reminder letter in the mail two months in advance. If you don't receive a letter, please call our office to schedule the follow-up appointment.        If you need a refill on your cardiac medications before your next appointment, please call your pharmacy.

## 2015-04-02 NOTE — Progress Notes (Signed)
Patient ID: Anthony Gould, male   DOB: 07/09/1933, 82 y.o.   MRN: [id number] PCP: Dr. Alain Marion  82 yo with history of mechanical mitral valve replacement for severe mitral regurgitation and permanent atrial fibrillation presents for cardiology followup.  Patient has a history of possible TIAs for which he has has been on aspirin and dipyridamole long-term.  Patient had an ETT-myoview in 12/11 with no ischemia or infarction.  This fall, he reported episodes of tachypalpitations.  He ended up being set up for ETT-Cardiolite in 11/14.  Nuclear images showed no evidence for ischemia or infarction, but he had multiple runs of NSVT while on the treadmill.  He did not have lightheadedness with these episodes.  He was started on bisoprolol 2.5 mg daily.  Echo in 11/14 showed EF 50% with mild diffuse hypokinesis, moderately dilated RV with normal function, and normal mechanical mitral valve.  Holter in 11/14 showed occasional PVCs and up to 3 beats NSVT but no long runs.    He has not had any palpitations since that time after starting bisoprolol.  He does not get exertional chest pain.  Weight is down 5 lbs.  No exertional dyspnea now.  Plays golf every week.  No orthopnea or PND.  BP controlled.  No melena/BRBPR.    ECG: atrial fibrillation, left axis deviation, PVC  Labs (4/12): LDL 70, HDL 59, K 4.9, creatinine 1.0, TSH 6.5 (elevated), LFTs normal Labs (6/13): K 4.5, creatinine 1.0, LDL 60 HDL 51 Labs (6/14): LDL 43, HDL 51 Labs (11/14): K 4.1, creatinine 1.1, TSH normal, HCT 44 Labs (5/15): K 4.4, creatinine 1.1, HCT 44.3, LDL 63, HDL 72 Labs (3/16): LDL 65, HDl 67, K 5.2 => 4.9, creatinine 1.31 => 1.73 => 1.39 Labs (7/16): LDL 62, HDL 70  PMH: 1. Atrial fibrillation: Permanent, on coumadin.  He has not been able to tolerate metoprolol due to severe fatigue.  2. DM2: diet-controlled 3. HTN 4. Hyperlipidemia 5. 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  Patient was started on dipyridamole  years ago for this.   6. 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  Echo (6/13) with EF 50-55%, mild LVH, mildly dilated LV, mechanical mitral valve functioning normally, biatrial enlargement, PA systolic pressure 38 mmHg.  Echo (11/14) with EF 50%, mild diffuse hypokinesis, normal mechanical mitral valve, moderately dilated RV with normal systolic function. Echo (3/16) with mild LVH, EF 50-55%, mechanical mitral valve with mean gradient 5 mmHg, moderately dilated RV with normal systolic function, severe LAE, moderate TR, PASP 46 mmHg.  7. ETT-myoview (12/11): EF 46%, no evidence for ischemia or infarction, there were runs of NSVT reported.  ETT-Cardiolite (11/14): not gated, 5:02 exercise, no ischemia or infarction by perfusion images but had multiple runs of NSVT.  8. Diastolic CHF.  9. Gout 10. NSVT: ETT-Cardiolite in 11/14 with runs of NSVT.  Holter monitor (11/14) showed predominant atrial fibrillation with some PVCs, he had a few run of 3 beats NSVT.   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  Former Therapist, sports.  Lives in [place].   FH: Father with MI at 64.  Mother with CVA.   ROS: All systems reviewed and negative except as per HPI.    Current Outpatient Prescriptions  Medication Sig Dispense Refill  . allopurinol (ZYLOPRIM) 300 MG tablet Take 1 tablet (300 mg total) by mouth daily as needed. 90 tablet 3  . aspirin 81 MG EC tablet Take 81 mg by mouth daily.      [name]  atorvastatin (LIPITOR) 20 MG tablet Take 1 tablet (20 mg total) by mouth daily. 90 tablet 3  . bisoprolol (ZEBETA) 5 MG tablet TAKE 1/2 BY MOUTH DAILY 45 tablet 3  . celecoxib (CELEBREX) 200 MG capsule Take 200 mg by mouth. One tab once a week    . diltiazem (CARDIZEM CD) 360 MG 24 hr capsule TAKE 1 BY MOUTH DAILY 90 capsule 1  . dipyridamole (PERSANTINE) 50 MG tablet Take 1 tablet (50 mg total) by mouth 3 (three) times daily. 270 tablet 0  . enalapril (VASOTEC) 20 MG tablet take 1  tablet by mouth twice a day 60 tablet 0  . furosemide (LASIX) 40 MG tablet 1 tablet (40mg ) by mouth in the AM and 1/2 tablet (20mg ) by mouth in the PM 135 tablet 3  . latanoprost (XALATAN) 0.005 % ophthalmic solution Place 1 drop into both eyes at bedtime.    . magnesium oxide (MAG-OX) 400 MG tablet Take 400 mg by mouth 3 (three) times daily.      . pantoprazole (PROTONIX) 40 MG tablet Take 1 tablet (40 mg total) by mouth daily. 90 tablet 3  . trimethoprim-polymyxin b (POLYTRIM) ophthalmic solution Place 1 drop into the right eye every 4 (four) hours. 10 mL 0  . warfarin (COUMADIN) 5 MG tablet Take as directed by the Anticoagulation Clinic 135 tablet 1   No current facility-administered medications for this visit.    BP 118/76 mmHg  Pulse 76  Ht 5\' 10"  (1.778 m)  Wt 189 lb 12.8 oz (86.093 kg)  BMI 27.23 kg/m2 General: NAD Neck: JVP 8 cm, no thyromegaly or thyroid nodule.  Lungs: Clear to auscultation bilaterally with normal respiratory effort. CV: Nondisplaced PMI.  Heart irregular S1/S2, mechanical S1, no [id number], 1/6 systolic murmur along the sternal border.  Trace ankle edema.  No carotid bruit.  Normal pedal pulses.  Abdomen: Soft, nontender, no hepatosplenomegaly, no distention.  Neurologic: Alert and oriented x 3.  Psych: Normal affect. Extremities: No clubbing or cyanosis.   Assessment/Plan:  1. Atrial fibrillation:  Stable on coumadin.  Good rate control.   Check CBC today.  2. Hypertension:   BP controlled.  Continue current diltiazem CD, bisoprolol, and enalapril.   3. Medtronic-Hall mechanical mitral valve:  Continue coumadin and ASA 81 mg daily.  Last echo in 11/14 showed well-seated valve.  Goal INR 3-3.5 given TIAs while on coumadin with lower INR goal.   4. TIA:  History of TIAs while on coumadin.  Has been on dipyridamole long-term with no recurrent TIA symptoms.    5. Hyperlipidemia: 7/16 lipids at goal.      6. Chronic diastolic CHF: NYHA class II, not significantly  volume overloaded. - Continue current Lasix.  BMET today.  7. NSVT: Multiple runs while doing ETT in 11/14.  Looking back, it appears that he also had NSVT on his ETT-Myoview in 2011.  Perfusion images were normal on both studies.  He did not have chest pain or lightheadedness.  I have started him on bisoprolol, which he has tolerated (had trouble with metoprolol in the past).  Echo showed preserved EF and holter showed occasional PVCs with a couple runs of 3 beats NSVT.    Loralie Champagne 04/02/2015

## 2015-04-05 ENCOUNTER — Other Ambulatory Visit: Payer: Self-pay | Admitting: Cardiology

## 2015-04-28 ENCOUNTER — Other Ambulatory Visit: Payer: Self-pay

## 2015-04-28 ENCOUNTER — Encounter (HOSPITAL_COMMUNITY): Payer: Self-pay | Admitting: Emergency Medicine

## 2015-04-28 ENCOUNTER — Emergency Department (HOSPITAL_COMMUNITY)
Admission: EM | Admit: 2015-04-28 | Discharge: 2015-04-28 | Disposition: A | Discharge status: Home / Self Care | Payer: Medicare Other | Attending: Emergency Medicine | Admitting: Emergency Medicine

## 2015-04-28 ENCOUNTER — Emergency Department (HOSPITAL_COMMUNITY): Payer: Medicare Other

## 2015-04-28 ENCOUNTER — Telehealth: Payer: Self-pay | Admitting: Cardiology

## 2015-04-28 DIAGNOSIS — Z7901 Long term (current) use of anticoagulants: Secondary | ICD-10-CM | POA: Insufficient documentation

## 2015-04-28 DIAGNOSIS — E785 Hyperlipidemia, unspecified: Secondary | ICD-10-CM | POA: Diagnosis not present

## 2015-04-28 DIAGNOSIS — Z792 Long term (current) use of antibiotics: Secondary | ICD-10-CM | POA: Insufficient documentation

## 2015-04-28 DIAGNOSIS — I10 Essential (primary) hypertension: Secondary | ICD-10-CM | POA: Diagnosis not present

## 2015-04-28 DIAGNOSIS — Z7982 Long term (current) use of aspirin: Secondary | ICD-10-CM | POA: Diagnosis not present

## 2015-04-28 DIAGNOSIS — Z8673 Personal history of transient ischemic attack (TIA), and cerebral infarction without residual deficits: Secondary | ICD-10-CM | POA: Diagnosis not present

## 2015-04-28 DIAGNOSIS — R110 Nausea: Secondary | ICD-10-CM | POA: Insufficient documentation

## 2015-04-28 DIAGNOSIS — I4891 Unspecified atrial fibrillation: Secondary | ICD-10-CM | POA: Diagnosis not present

## 2015-04-28 DIAGNOSIS — Z79899 Other long term (current) drug therapy: Secondary | ICD-10-CM | POA: Diagnosis not present

## 2015-04-28 DIAGNOSIS — E119 Type 2 diabetes mellitus without complications: Secondary | ICD-10-CM | POA: Insufficient documentation

## 2015-04-28 DIAGNOSIS — M25551 Pain in right hip: Secondary | ICD-10-CM | POA: Diagnosis not present

## 2015-04-28 HISTORY — DX: Encounter for other specified aftercare: Z51.89

## 2015-04-28 LAB — I-STAT CHEM 8, ED
BUN: 24 mg/dL — ABNORMAL HIGH (ref 6–20)
CREATININE: 1.2 mg/dL (ref 0.61–1.24)
Calcium, Ion: 1.15 mmol/L (ref 1.13–1.30)
Chloride: 99 mmol/L — ABNORMAL LOW (ref 101–111)
GLUCOSE: 161 mg/dL — AB (ref 65–99)
HCT: 46 % (ref 39.0–52.0)
HEMOGLOBIN: 15.6 g/dL (ref 13.0–17.0)
Potassium: 4.3 mmol/L (ref 3.5–5.1)
Sodium: 134 mmol/L — ABNORMAL LOW (ref 135–145)
TCO2: 21 mmol/L (ref 0–100)

## 2015-04-28 LAB — PROTIME-INR
INR: 2.36 — AB (ref 0.00–1.49)
PROTHROMBIN TIME: 25.6 s — AB (ref 11.6–15.2)

## 2015-04-28 LAB — CBC WITH DIFFERENTIAL/PLATELET
BASOS PCT: 1 %
Basophils Absolute: 0 10*3/uL (ref 0.0–0.1)
Eosinophils Absolute: 0.5 10*3/uL (ref 0.0–0.7)
Eosinophils Relative: 6 %
HEMATOCRIT: 40.5 % (ref 39.0–52.0)
Hemoglobin: 14.3 g/dL (ref 13.0–17.0)
LYMPHS PCT: 18 %
Lymphs Abs: 1.5 10*3/uL (ref 0.7–4.0)
MCH: 34 pg (ref 26.0–34.0)
MCHC: 35.3 g/dL (ref 30.0–36.0)
MCV: 96.2 fL (ref 78.0–100.0)
MONO ABS: 0.6 10*3/uL (ref 0.1–1.0)
MONOS PCT: 8 %
NEUTROS ABS: 5.7 10*3/uL (ref 1.7–7.7)
Neutrophils Relative %: 67 %
Platelets: 208 10*3/uL (ref 150–400)
RBC: 4.21 MIL/uL — ABNORMAL LOW (ref 4.22–5.81)
RDW: 14.2 % (ref 11.5–15.5)
WBC: 8.4 10*3/uL (ref 4.0–10.5)

## 2015-04-28 LAB — I-STAT TROPONIN, ED: Troponin i, poc: 0.01 ng/mL (ref 0.00–0.08)

## 2015-04-28 MED ORDER — KETOROLAC TROMETHAMINE 30 MG/ML IJ SOLN
60.0000 mg | Freq: Once | INTRAMUSCULAR | Status: AC
  Administered 2015-04-28: 60 mg via INTRAMUSCULAR
  Filled 2015-04-28: qty 2

## 2015-04-28 MED ORDER — DICYCLOMINE HCL 10 MG/ML IM SOLN
20.0000 mg | Freq: Once | INTRAMUSCULAR | Status: AC
  Administered 2015-04-28: 20 mg via INTRAMUSCULAR
  Filled 2015-04-28: qty 2

## 2015-04-28 MED ORDER — ONDANSETRON 8 MG PO TBDP
8.0000 mg | ORAL_TABLET | Freq: Once | ORAL | Status: AC
  Administered 2015-04-28: 8 mg via ORAL
  Filled 2015-04-28: qty 1

## 2015-04-28 NOTE — Telephone Encounter (Signed)
Spoke with pt , he states that he was return a call to [facility], and this was not a stat called. Pt is aware that I will rout Anthony Gould this message so she can call him back when she get the time.

## 2015-04-28 NOTE — Telephone Encounter (Signed)
Pt rtn call to Anne-pls call

## 2015-04-28 NOTE — Telephone Encounter (Signed)
I spoke with patient about sending refills to his new mail order service, [employer].   Pt states he does not need refills right now but when he does he will have [employer] [facility] contact our office.

## 2015-04-28 NOTE — ED Provider Notes (Signed)
CSN: [id number]     Arrival date & time 04/28/15  0509 History   First MD Initiated Contact with Patient 04/28/15 [phone]     Chief Complaint  Patient presents with  . Hip Pain  . Nausea   HPI  Anthony Gould is a 82 y.o. male PMH significant for hyperlipidemia, hypertension, A-fib, diabetes type 2, TIA presenting with a 1 day history of right hip pain. He states Monday through Wednesday he had diarrhea, which has since resolved. On Monday, he went out and played a few rounds of golf. He describes the pain as 8 out of 10 pain scale radiating from his right hip to his back to suprapubic region, intermittent, worse when lying flat, improved with walking and sitting up. He endorses nausea without emesis. No fevers, chills, chest pain, shortness of breath, loss of bowel or bladder control. He was concerned because he was worried he may have appendicitis, constipation, or kidney stone.  Past Medical History  Diagnosis Date  . Hyperlipidemia   . HTN (hypertension)   . Atrial fibrillation ([place])     last echo 11/22/06 - EF 50-55%, mild dilation LV, mild MR w/68mHg gradient   . Type II or unspecified type diabetes mellitus without mention of complication, not stated as uncontrolled     lifestyle mangement  . Peripheral edema     venous insufficiency  . Elevated TSH   . TIA (transient ischemic attack)     while on coumadin  . Ventricular ectopy     symptomatic  . Normal nuclear stress test 12/25/01    no ischemia  . 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 Blood transfusion without reported diagnosis    Past Surgical History  Procedure Laterality Date  . 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 Cholecystectomy, laparoscopic  2003   Family History  Problem Relation Age of Onset  . Coronary artery disease Father   . Prostate cancer Neg Hx   . Colon cancer Neg Hx   . Diabetes Other   . Coronary artery disease Other    Social  History  Substance Use Topics  . Smoking status: Never Smoker   . Smokeless tobacco: None  . Alcohol Use: Yes     Comment: wine    Review of Systems  Ten systems are reviewed and are negative for acute change except as noted in the HPI  Allergies  Sulfonamide derivatives  Home Medications   Prior to Admission medications   Medication Sig Start Date End Date Taking? Authorizing Provider  allopurinol (ZYLOPRIM) 300 MG tablet Take 1 tablet (300 mg total) by mouth daily as needed. 10/20/14   Aleksei Plotnikov V, MD  aspirin 81 MG EC tablet Take 81 mg by mouth daily.      Historical Provider, MD  atorvastatin (LIPITOR) 20 MG tablet Take 1 tablet (20 mg total) by mouth daily. 11/19/14   Aleksei Plotnikov V, MD  bisoprolol (ZEBETA) 5 MG tablet TAKE 1/2 BY MOUTH DAILY 11/18/14   DLarey Dresser MD  celecoxib (CELEBREX) 200 MG capsule Take 200 mg by mouth. One tab once a week 07/21/13   ACassandria Anger MD  diltiazem (CARDIZEM CD) 360 MG 24 hr capsule TAKE 1 BY MOUTH DAILY 04/05/15   DLarey Dresser MD  dipyridamole (PERSANTINE) 50 MG tablet Take 1 tablet (50 mg total) by mouth 3 (three) times daily. 03/08/15  Larey Dresser, MD  enalapril (VASOTEC) 20 MG tablet take 1 tablet by mouth twice a day 03/29/15   Cassandria Anger, MD  furosemide (LASIX) 40 MG tablet 1 tablet (83m) by mouth in the AM and 1/2 tablet (230m by mouth in the PM 07/28/14   DaLarey DresserMD  latanoprost (XALATAN) 0.005 % ophthalmic solution Place 1 drop into both eyes at bedtime.    Historical Provider, MD  magnesium oxide (MAG-OX) 400 MG tablet Take 400 mg by mouth 3 (three) times daily.      Historical Provider, MD  pantoprazole (PROTONIX) 40 MG tablet Take 1 tablet (40 mg total) by mouth daily. 01/12/15   Aleksei Plotnikov V, MD  trimethoprim-polymyxin b (POLYTRIM) ophthalmic solution Place 1 drop into the right eye every 4 (four) hours. 02/26/15   StBinnie RailMD  warfarin (COUMADIN) 5 MG tablet Take as directed  by the Anticoagulation Clinic 11/19/14   DaLarey DresserMD   BP 145/78 mmHg  Pulse 74  Temp(Src) 97.5 F (36.4 C)  Resp 18  Wt 83.008 kg  SpO2 97% Physical Exam  Constitutional: He appears well-developed and well-nourished. No distress.  HENT:  Head: Normocephalic and atraumatic.  Mouth/Throat: Oropharynx is clear and moist. No oropharyngeal exudate.  Eyes: Conjunctivae are normal. Pupils are equal, round, and reactive to light. Right eye exhibits no discharge. Left eye exhibits no discharge. No scleral icterus.  Neck: No tracheal deviation present.  Cardiovascular: Normal rate, regular rhythm, normal heart sounds and intact distal pulses.  Exam reveals no gallop and no friction rub.   No murmur heard. Pulmonary/Chest: Effort normal and breath sounds normal. No respiratory distress. He has no wheezes. He has no rales. He exhibits no tenderness.  Abdominal: Soft. Bowel sounds are normal. He exhibits no distension and no mass. There is no tenderness. There is no rebound and no guarding.  No CVA tenderness.  Musculoskeletal: Normal range of motion. He exhibits no edema or tenderness.  Neurovascularly intact bilaterally. Patient ambulates without difficulty.  Lymphadenopathy:    He has no cervical adenopathy.  Neurological: He is alert. Coordination normal.  Skin: Skin is warm and dry. No rash noted. He is not diaphoretic. No erythema.  Psychiatric: He has a normal mood and affect. His behavior is normal.  Nursing note and vitals reviewed.   ED Course  Procedures  Labs Review Labs Reviewed  CBC WITH DIFFERENTIAL/PLATELET - Abnormal; Notable for the following:    RBC 4.21 (*)    All other components within normal limits  PROTIME-INR - Abnormal; Notable for the following:    Prothrombin Time 25.6 (*)    INR 2.36 (*)    All other components within normal limits  I-STAT CHEM 8, ED - Abnormal; Notable for the following:    Sodium 134 (*)    Chloride 99 (*)    BUN 24 (*)     Glucose, Bld 161 (*)    All other components within normal limits  I-STAT TROPOININ, ED   Imaging Review Dg Chest 2 View  04/28/2015  CLINICAL DATA:  Nausea EXAM: CHEST  2 VIEW COMPARISON:  None FINDINGS: Cardiomegaly. The patient is status post median sternotomy for mitral valve replacement. Negative aortic and hilar contours. There is no edema, consolidation, effusion, or pneumothorax. Cholecystectomy changes. IMPRESSION: 1. No acute finding. 2. Cardiomegaly. Electronically Signed   By: JoMonte Fantasia.D.   On: 04/28/2015 06:21   Dg Hip Unilat With Pelvis 2-3 Views Right  04/28/2015  CLINICAL DATA:  New generalized right hip and groin pain since yesterday. No specific trauma. EXAM: DG HIP (WITH OR WITHOUT PELVIS) 2-3V RIGHT COMPARISON:  None. FINDINGS: Negative for fracture or dislocation. No erosion or focal bone lesion. Heterotopic ossification asymmetrically about the right ischial tuberosity compatible with remote avulsion injury. No degenerative hip narrowing. Atherosclerosis. IMPRESSION: No acute finding or degenerative joint narrowing. Electronically Signed   By: Monte Fantasia M.D.   On: 04/28/2015 06:17   I have personally reviewed and evaluated these images and lab results as part of my medical decision-making.   EKG Interpretation   Date/Time:  Thursday April 28 2015 05:34:19 EST Ventricular Rate:  80 PR Interval:    QRS Duration: 127 QT Interval:  397 QTC Calculation: 458 R Axis:   -74 Text Interpretation:  Atrial fibrillation Nonspecific IVCD with LAD  Confirmed by [facility]  MD, APRIL ([id number]) on 04/28/2015 6:05:25 AM      MDM   Final diagnoses:  Hip pain, right   Patient nontoxic-appearing. Vital signs stable. Based on patient history and physical exam, most likely etiologies include MSK pain. Less likely etiologies include ruptured AAA, pancreatitis, cholecystitis, ulcer, vertebral compression fracture, retroperitoneal bleed, spinal epidural abscess, discitis,  osteomyelitis, pyelonephritis/perinephric abscess, cauda equina syndrome, herniated disc, spinal stenosis, fibromyalgia, rheumatoid arthritis, spondylitis, nephrolithiasis, multiple myeloma, bony metastasis. There is no evidence for constipation, appendicitis, or kidney stone.  Chem 8, troponin, CBC, INR unremarkable. Chest x-ray and right hip and pelvis x-ray unremarkable. Dr. Randal Buba evaluated patient as well. She agrees with treatment plan and advises discharge with close primary care follow-up. Patient may be safely discharged. Patient to follow-up with primary care provider within 1 week. Discussed return precautions. Patient and understanding and agreement with the plan.  [place] Lions, [name] 04/28/15 [id number]  April Palumbo, MD 04/29/15 [phone]

## 2015-04-28 NOTE — Discharge Instructions (Signed)
Mr. NEMIAH SCALLION,  Nice meeting you! Please follow-up with your primary care provider. Return to the emergency department if you develop fevers, chills, lose control of your bladder/bowel, have increasing pain. Feel better soon!  S. Wendie Simmer, [name]

## 2015-04-28 NOTE — ED Notes (Signed)
Pt states he has had "the flu" since Monday and he is still feeling nauseous. Pt states he has had diarrhea since then  Primary reason for pt's visit is for right hip pain that started yesterday around 2000 while he was sitting in a chair. No known injury or obvious deformity. Pt ambulatory

## 2015-04-28 NOTE — ED Notes (Signed)
Patient transported to X-ray 

## 2015-04-29 ENCOUNTER — Ambulatory Visit (INDEPENDENT_AMBULATORY_CARE_PROVIDER_SITE_OTHER): Payer: Medicare Other | Admitting: *Deleted

## 2015-04-29 DIAGNOSIS — I059 Rheumatic mitral valve disease, unspecified: Secondary | ICD-10-CM

## 2015-04-29 DIAGNOSIS — I6350 Cerebral infarction due to unspecified occlusion or stenosis of unspecified cerebral artery: Secondary | ICD-10-CM

## 2015-04-29 DIAGNOSIS — I4891 Unspecified atrial fibrillation: Secondary | ICD-10-CM

## 2015-04-29 DIAGNOSIS — G459 Transient cerebral ischemic attack, unspecified: Secondary | ICD-10-CM

## 2015-04-29 DIAGNOSIS — Z9889 Other specified postprocedural states: Secondary | ICD-10-CM

## 2015-04-29 DIAGNOSIS — Z7901 Long term (current) use of anticoagulants: Secondary | ICD-10-CM | POA: Diagnosis not present

## 2015-04-29 LAB — POCT INR: INR: 3

## 2015-05-02 ENCOUNTER — Ambulatory Visit (INDEPENDENT_AMBULATORY_CARE_PROVIDER_SITE_OTHER): Payer: Medicare Other | Admitting: Internal Medicine

## 2015-05-02 ENCOUNTER — Encounter: Payer: Self-pay | Admitting: Internal Medicine

## 2015-05-02 ENCOUNTER — Other Ambulatory Visit (INDEPENDENT_AMBULATORY_CARE_PROVIDER_SITE_OTHER): Payer: Medicare Other

## 2015-05-02 VITALS — BP 162/74 | HR 63 | Wt 186.0 lb

## 2015-05-02 DIAGNOSIS — R1010 Upper abdominal pain, unspecified: Secondary | ICD-10-CM | POA: Diagnosis not present

## 2015-05-02 DIAGNOSIS — E1159 Type 2 diabetes mellitus with other circulatory complications: Secondary | ICD-10-CM

## 2015-05-02 DIAGNOSIS — I482 Chronic atrial fibrillation, unspecified: Secondary | ICD-10-CM

## 2015-05-02 DIAGNOSIS — R109 Unspecified abdominal pain: Secondary | ICD-10-CM

## 2015-05-02 LAB — URINALYSIS
BILIRUBIN URINE: NEGATIVE
Hgb urine dipstick: NEGATIVE
Ketones, ur: NEGATIVE
Leukocytes, UA: NEGATIVE
NITRITE: NEGATIVE
PH: 5 (ref 5.0–8.0)
Specific Gravity, Urine: 1.01 (ref 1.000–1.030)
TOTAL PROTEIN, URINE-UPE24: NEGATIVE
Urine Glucose: NEGATIVE
Urobilinogen, UA: 0.2 (ref 0.0–1.0)

## 2015-05-02 MED ORDER — TRAMADOL HCL 50 MG PO TABS: 25.0000 mg | ORAL_TABLET | Freq: Four times a day (QID) | ORAL | Status: DC | PRN

## 2015-05-02 MED ORDER — ENALAPRIL MALEATE 20 MG PO TABS: 20.0000 mg | ORAL_TABLET | Freq: Two times a day (BID) | ORAL | Status: DC

## 2015-05-02 NOTE — Progress Notes (Signed)
Pre visit review using our clinic review tool, if applicable. No additional management support is needed unless otherwise documented below in the visit note. 

## 2015-05-02 NOTE — Assessment & Plan Note (Addendum)
2/17 R flank ?etiology MSK vs hematoma vs other ??zoster UA ER notes reviewed RTC if not better

## 2015-05-02 NOTE — Progress Notes (Signed)
Subjective:  Patient ID: Anthony Gould, male    DOB: 08/15/1933  Age: 82 y.o. MRN: [id number]  CC: No chief complaint on file.   HPI Anthony Gould presents for ER visit on 04/28/15 f/u for R flank pain and R hip pain. EKG, labs, R hip x ry were ok: " Hip pain, right   Patient nontoxic-appearing. Vital signs stable. Based on patient history and physical exam, most likely etiologies include MSK pain. Less likely etiologies include ruptured AAA, pancreatitis, cholecystitis, ulcer, vertebral compression fracture, retroperitoneal bleed, spinal epidural abscess, discitis, osteomyelitis, pyelonephritis/perinephric abscess, cauda equina syndrome, herniated disc, spinal stenosis, fibromyalgia, rheumatoid arthritis, spondylitis, nephrolithiasis, multiple myeloma, bony metastasis. There is no evidence for constipation, appendicitis, or kidney stone.  Chem 8, troponin, CBC, INR unremarkable. Chest x-ray and right hip and pelvis x-ray unremarkable. Dr. Randal Buba evaluated patient as well. She agrees with treatment plan and advises discharge with close primary care follow-up. Patient may be safely discharged. Patient to follow-up with primary care provider within 1 week. Discussed return precautions. Patient and understanding and agreement with the plan.  [place] Lions, [name] 04/28/15 0708"      Now no pain in at rest, upright. C/o pain when laying on the R side  Outpatient Prescriptions Prior to Visit  Medication Sig Dispense Refill  . allopurinol (ZYLOPRIM) 300 MG tablet Take 1 tablet (300 mg total) by mouth daily as needed. 90 tablet 3  . aspirin 81 MG EC tablet Take 81 mg by mouth daily.      [name] atorvastatin (LIPITOR) 20 MG tablet Take 1 tablet (20 mg total) by mouth daily. 90 tablet 3  . bisoprolol (ZEBETA) 5 MG tablet TAKE 1/2 BY MOUTH DAILY 45 tablet 3  . diltiazem (CARDIZEM CD) 360 MG 24 hr capsule TAKE 1 BY MOUTH DAILY 90 capsule 0  . dipyridamole (PERSANTINE) 50 MG tablet  Take 1 tablet (50 mg total) by mouth 3 (three) times daily. 270 tablet 0  . furosemide (LASIX) 40 MG tablet 1 tablet (65m) by mouth in the AM and 1/2 tablet (27m by mouth in the PM 135 tablet 3  . latanoprost (XALATAN) 0.005 % ophthalmic solution Place 1 drop into both eyes at bedtime.    . magnesium oxide (MAG-OX) 400 MG tablet Take 400 mg by mouth 3 (three) times daily.      . pantoprazole (PROTONIX) 40 MG tablet Take 1 tablet (40 mg total) by mouth daily. 90 tablet 3  . warfarin (COUMADIN) 5 MG tablet Take as directed by the Anticoagulation Clinic 135 tablet 1  . enalapril (VASOTEC) 20 MG tablet take 1 tablet by mouth twice a day 60 tablet 0  . celecoxib (CELEBREX) 200 MG capsule Take 200 mg by mouth. Reported on 05/02/2015    . trimethoprim-polymyxin b (POLYTRIM) ophthalmic solution Place 1 drop into the right eye every 4 (four) hours. (Patient not taking: Reported on 05/02/2015) 10 mL 0   No facility-administered medications prior to visit.    ROS Review of Systems  Constitutional: Negative for appetite change, fatigue and unexpected weight change.  HENT: Negative for congestion, nosebleeds, sneezing, sore throat and trouble swallowing.   Eyes: Negative for itching and visual disturbance.  Respiratory: Negative for cough.   Cardiovascular: Negative for chest pain, palpitations and leg swelling.  Gastrointestinal: Negative for nausea, diarrhea, blood in stool and abdominal distention.  Genitourinary: Negative for frequency and hematuria.  Musculoskeletal: Positive for back pain. Negative for joint swelling, gait problem and neck pain.  Skin: Negative for color change, pallor, rash and wound.  Neurological: Negative for dizziness, tremors, speech difficulty and weakness.  Psychiatric/Behavioral: Negative for sleep disturbance, dysphoric mood and agitation. The patient is not nervous/anxious.     Objective:  BP 162/74 mmHg  Pulse 63  Wt 186 lb (84.369 kg)  SpO2 94%  BP Readings  from Last 3 Encounters:  05/02/15 162/74  04/28/15 145/78  04/01/15 118/76    Wt Readings from Last 3 Encounters:  05/02/15 186 lb (84.369 kg)  04/28/15 183 lb (83.008 kg)  04/01/15 189 lb 12.8 oz (86.093 kg)    Physical Exam  Constitutional: He is oriented to person, place, and time. He appears well-developed. No distress.  NAD  HENT:  Mouth/Throat: Oropharynx is clear and moist.  Eyes: Conjunctivae are normal. Pupils are equal, round, and reactive to light.  Neck: Normal range of motion. No JVD present. No thyromegaly present.  Cardiovascular: Normal rate, regular rhythm, normal heart sounds and intact distal pulses.  Exam reveals no gallop and no friction rub.   No murmur heard. Pulmonary/Chest: Effort normal and breath sounds normal. No respiratory distress. He has no wheezes. He has no rales. He exhibits no tenderness.  Abdominal: Soft. Bowel sounds are normal. He exhibits no distension and no mass. There is no tenderness. There is no rebound and no guarding.  Musculoskeletal: Normal range of motion. He exhibits no edema or tenderness.  Lymphadenopathy:    He has no cervical adenopathy.  Neurological: He is alert and oriented to person, place, and time. He has normal reflexes. No cranial nerve deficit. He exhibits normal muscle tone. He displays a negative Romberg sign. Coordination and gait normal.  Skin: Skin is warm and dry. No rash noted.  Psychiatric: He has a normal mood and affect. His behavior is normal. Judgment and thought content normal.    Lab Results  Component Value Date   WBC 8.4 04/28/2015   HGB 15.6 04/28/2015   HCT 46.0 04/28/2015   PLT 208 04/28/2015   GLUCOSE 161* 04/28/2015   CHOL 150 10/20/2014   TRIG 87.0 10/20/2014   HDL 70.40 10/20/2014   LDLCALC 62 10/20/2014   ALT 18 10/20/2014   AST 26 10/20/2014   NA 134* 04/28/2015   K 4.3 04/28/2015   CL 99* 04/28/2015   CREATININE 1.20 04/28/2015   BUN 24* 04/28/2015   CO2 25 04/01/2015   TSH  6.69* 10/20/2014   PSA 0.87 10/20/2014   INR 3.0 04/29/2015   HGBA1C 5.6 10/20/2014    Dg Chest 2 View  04/28/2015  CLINICAL DATA:  Nausea EXAM: CHEST  2 VIEW COMPARISON:  None FINDINGS: Cardiomegaly. The patient is status post median sternotomy for mitral valve replacement. Negative aortic and hilar contours. There is no edema, consolidation, effusion, or pneumothorax. Cholecystectomy changes. IMPRESSION: 1. No acute finding. 2. Cardiomegaly. Electronically Signed   By: Monte Fantasia M.D.   On: 04/28/2015 06:21   Dg Hip Unilat With Pelvis 2-3 Views Right  04/28/2015  CLINICAL DATA:  New generalized right hip and groin pain since yesterday. No specific trauma. EXAM: DG HIP (WITH OR WITHOUT PELVIS) 2-3V RIGHT COMPARISON:  None. FINDINGS: Negative for fracture or dislocation. No erosion or focal bone lesion. Heterotopic ossification asymmetrically about the right ischial tuberosity compatible with remote avulsion injury. No degenerative hip narrowing. Atherosclerosis. IMPRESSION: No acute finding or degenerative joint narrowing. Electronically Signed   By: Monte Fantasia M.D.   On: 04/28/2015 06:17    Assessment & Plan:  Diagnoses and all orders for this visit:  Flank pain, acute -     Urinalysis; Future  Other orders -     Cancel: allopurinol (ZYLOPRIM) 300 MG tablet; Take 1 tablet (300 mg total) by mouth daily as needed. -     enalapril (VASOTEC) 20 MG tablet; Take 1 tablet (20 mg total) by mouth 2 (two) times daily. -     Discontinue: traMADol (ULTRAM) 50 MG tablet; Take 0.5-1 tablets (25-50 mg total) by mouth every 6 (six) hours as needed for moderate pain or severe pain. -     traMADol (ULTRAM) 50 MG tablet; Take 0.5-1 tablets (25-50 mg total) by mouth every 6 (six) hours as needed for moderate pain or severe pain.   I have changed Anthony Gould's enalapril. I am also having him maintain his aspirin, magnesium oxide, celecoxib, furosemide, allopurinol, bisoprolol, atorvastatin,  warfarin, pantoprazole, trimethoprim-polymyxin b, dipyridamole, latanoprost, diltiazem, and traMADol.  Meds ordered this encounter  Medications  . enalapril (VASOTEC) 20 MG tablet    Sig: Take 1 tablet (20 mg total) by mouth 2 (two) times daily.    Dispense:  180 tablet    Refill:  3  . DISCONTD: traMADol (ULTRAM) 50 MG tablet    Sig: Take 0.5-1 tablets (25-50 mg total) by mouth every 6 (six) hours as needed for moderate pain or severe pain.    Dispense:  60 tablet    Refill:  0  . traMADol (ULTRAM) 50 MG tablet    Sig: Take 0.5-1 tablets (25-50 mg total) by mouth every 6 (six) hours as needed for moderate pain or severe pain.    Dispense:  60 tablet    Refill:  0     Follow-up: Return in about 4 weeks (around 05/30/2015) for a follow-up visit.  Walker Kehr, MD

## 2015-05-05 ENCOUNTER — Encounter: Payer: Self-pay | Admitting: Internal Medicine

## 2015-05-05 NOTE — Assessment & Plan Note (Signed)
  On diet  

## 2015-05-05 NOTE — Assessment & Plan Note (Signed)
Chronic  Bisoprolol, Diltiazem, ASA, Coumadin

## 2015-05-16 ENCOUNTER — Telehealth: Payer: Self-pay | Admitting: Cardiology

## 2015-05-16 MED ORDER — WARFARIN SODIUM 5 MG PO TABS: ORAL_TABLET | ORAL | Status: DC

## 2015-05-16 NOTE — Telephone Encounter (Signed)
New message      *STAT* If patient is at the pharmacy, call can be transferred to refill team.   1. Which medications need to be refilled? (please list name of each medication and dose if known) warfarin 5mg  2. Which pharmacy/location (including street and city if local pharmacy) is medication to be sent to? [facility] mail order service  3. Do they need a 30 day or 90 day supply? 135 tablets

## 2015-05-16 NOTE — Telephone Encounter (Signed)
Done, refill sent to pharmacy as requested.

## 2015-05-16 NOTE — Telephone Encounter (Signed)
Follow up       *STAT* If patient is at the pharmacy, call can be transferred to refill team.   1. Which medications need to be refilled? (please list name of each medication and dose if known)  Diltiazem 360mg , furosemide 40mg , dipyradamole 50mg  2. Which pharmacy/location (including street and city if local pharmacy) is medication to be sent to? [facility] order [facility] 3. Do they need a 30 day or 90 day supply? 90 day supply

## 2015-05-27 ENCOUNTER — Ambulatory Visit (INDEPENDENT_AMBULATORY_CARE_PROVIDER_SITE_OTHER): Payer: Medicare Other | Admitting: Pharmacist

## 2015-05-27 DIAGNOSIS — Z7901 Long term (current) use of anticoagulants: Secondary | ICD-10-CM | POA: Diagnosis not present

## 2015-05-27 DIAGNOSIS — G459 Transient cerebral ischemic attack, unspecified: Secondary | ICD-10-CM

## 2015-05-27 DIAGNOSIS — I482 Chronic atrial fibrillation, unspecified: Secondary | ICD-10-CM

## 2015-05-27 DIAGNOSIS — I6350 Cerebral infarction due to unspecified occlusion or stenosis of unspecified cerebral artery: Secondary | ICD-10-CM | POA: Diagnosis not present

## 2015-05-27 DIAGNOSIS — I4891 Unspecified atrial fibrillation: Secondary | ICD-10-CM | POA: Diagnosis not present

## 2015-05-27 DIAGNOSIS — Z9889 Other specified postprocedural states: Secondary | ICD-10-CM

## 2015-05-27 DIAGNOSIS — I059 Rheumatic mitral valve disease, unspecified: Secondary | ICD-10-CM

## 2015-05-27 LAB — POCT INR: INR: 2.4

## 2015-06-06 ENCOUNTER — Telehealth: Payer: Self-pay | Admitting: Internal Medicine

## 2015-06-06 NOTE — Telephone Encounter (Signed)
OV w/any provider or ER if not better Thx

## 2015-06-06 NOTE — Telephone Encounter (Signed)
Called and spoke to pt. Pt states that he is having some improvement in how he is feeling.

## 2015-06-06 NOTE — Telephone Encounter (Signed)
Patient Name: DEMETRIES ARNAU DOB: [birth year] Initial Comment caller states he has weakness all over and nausea Nurse Assessment Nurse: Ronnald Ramp, RN, Miranda Date/Time (Eastern Time): 06/06/2015 2:10:52 PM Confirm and document reason for call. If symptomatic, describe symptoms. You must click the next button to save text entered. ---Caller states he feels weak and has some nausea when he stands up and is active. Thursday afternoon he started having fever (which has resolved). He also had vomiting on Friday and diarrhea until yesterday. Has the patient traveled out of the country within the last 30 days? ---No Does the patient have any new or worsening symptoms? ---Yes Will a triage be completed? ---Yes Related visit to physician within the last 2 weeks? ---No Does the PT have any chronic conditions? (i.e. diabetes, asthma, etc.) ---Yes List chronic conditions. ---Heart, HTN Is this a behavioral health or substance abuse call? ---No Guidelines Guideline Title Affirmed Question Affirmed Notes Weakness (Generalized) and Fatigue Mild weakness or fatigue with acute minor illness (e.g., colds) (all triage questions negative) Final Disposition User Elliott, RN, Miranda Disagree/Comply: Leta Baptist

## 2015-06-09 ENCOUNTER — Telehealth: Payer: Self-pay | Admitting: Internal Medicine

## 2015-06-09 NOTE — Telephone Encounter (Signed)
[facility]  Patient Name: Anthony Gould  DOB: Aug 19, 1933    Initial Comment Caller states called about few days ago in regards to a virus, thought he was well and notice that the symptoms seems to be meningitis, currently stiff neck.    Nurse Assessment  Nurse: Justine Null, RN, Rodena Piety Date/Time (Eastern Time): 06/09/2015 6:43:36 PM  Confirm and document reason for call. If symptomatic, describe symptoms. You must click the next button to save text entered. ---Caller states called about few days ago in regards to a virus, thought he was well and notice that the symptoms seems to be meningitis, currently stiff neck. caller stated that he has been having fever about a week ago and ha s been having slight headache and has felt bad over the weekend and has no further fevers over the weekend and has been having the symptom of a stiff neck and has been able to bend down to the chest  Has the patient traveled out of the country within the last 30 days? ---No  Does the patient have any new or worsening symptoms? ---Yes  Will a triage be completed? ---Yes  Related visit to physician within the last 2 weeks? ---No  Does the PT have any chronic conditions? (i.e. diabetes, asthma, etc.) ---Yes  List chronic conditions. ---HTN heart disease heart valve  Is this a behavioral health or substance abuse call? ---No     Guidelines    Guideline Title Affirmed Question Affirmed Notes  Neck Pain or Stiffness [1] MODERATE neck pain (e.g., interferes with normal activities AND [2] present > 3 days    Final Disposition User   See PCP When Office is Open (within 3 days) Justine Null, RN, Rodena Piety    Referrals  REFERRED TO PCP OFFICE   Disagree/Comply: Leta Baptist

## 2015-06-13 ENCOUNTER — Telehealth: Payer: Self-pay | Admitting: Internal Medicine

## 2015-06-13 NOTE — Telephone Encounter (Signed)
Pt had a terrible virus last week that lasted a few days and he has gotten over it.  His wife had seen a commercial for meningitis and he had the same symptoms.  She got real worried and so he called the office and was transferred to the [facility] nurse line. The told him from the info he told them they do not feel he has meningitis and told him to call the office today. He feels absolutely fine but he thought he would ask Dr. Alain Marion. I went ahead and scheduled him for tomorrow since it was open. Please advise

## 2015-06-13 NOTE — Telephone Encounter (Signed)
Meningitis is a very severe disease that would normally require a hospital stay. He did not have meningitis Thx

## 2015-06-14 ENCOUNTER — Ambulatory Visit: Payer: Medicare Other | Admitting: Internal Medicine

## 2015-06-14 NOTE — Telephone Encounter (Signed)
Called pt gave him md response. Pt states that his wife had a virus first which then he got it. They both are feeling better. The team health nurse made him appt for this afternoon, but he don't need to [name] he feel much better. Inform pt will cancel appt...Anthony Gould

## 2015-06-17 ENCOUNTER — Ambulatory Visit (INDEPENDENT_AMBULATORY_CARE_PROVIDER_SITE_OTHER): Payer: Medicare Other | Admitting: Pharmacist

## 2015-06-17 DIAGNOSIS — I482 Chronic atrial fibrillation, unspecified: Secondary | ICD-10-CM

## 2015-06-17 DIAGNOSIS — I4891 Unspecified atrial fibrillation: Secondary | ICD-10-CM | POA: Diagnosis not present

## 2015-06-17 DIAGNOSIS — I059 Rheumatic mitral valve disease, unspecified: Secondary | ICD-10-CM

## 2015-06-17 DIAGNOSIS — I6350 Cerebral infarction due to unspecified occlusion or stenosis of unspecified cerebral artery: Secondary | ICD-10-CM

## 2015-06-17 DIAGNOSIS — Z9889 Other specified postprocedural states: Secondary | ICD-10-CM

## 2015-06-17 DIAGNOSIS — G459 Transient cerebral ischemic attack, unspecified: Secondary | ICD-10-CM

## 2015-06-17 DIAGNOSIS — Z7901 Long term (current) use of anticoagulants: Secondary | ICD-10-CM | POA: Diagnosis not present

## 2015-06-17 LAB — POCT INR: INR: 3.2

## 2015-07-04 ENCOUNTER — Other Ambulatory Visit: Payer: Self-pay

## 2015-07-04 MED ORDER — BISOPROLOL FUMARATE 5 MG PO TABS: ORAL_TABLET | ORAL | Status: DC

## 2015-07-06 ENCOUNTER — Other Ambulatory Visit: Payer: Self-pay | Admitting: *Deleted

## 2015-07-06 MED ORDER — DIPYRIDAMOLE 50 MG PO TABS: 50.0000 mg | ORAL_TABLET | Freq: Three times a day (TID) | ORAL | Status: DC

## 2015-07-06 MED ORDER — DILTIAZEM HCL ER COATED BEADS 360 MG PO CP24: ORAL_CAPSULE | ORAL | Status: DC

## 2015-07-08 ENCOUNTER — Ambulatory Visit (INDEPENDENT_AMBULATORY_CARE_PROVIDER_SITE_OTHER): Payer: Medicare Other

## 2015-07-08 DIAGNOSIS — G459 Transient cerebral ischemic attack, unspecified: Secondary | ICD-10-CM

## 2015-07-08 DIAGNOSIS — I4891 Unspecified atrial fibrillation: Secondary | ICD-10-CM

## 2015-07-08 DIAGNOSIS — I059 Rheumatic mitral valve disease, unspecified: Secondary | ICD-10-CM

## 2015-07-08 DIAGNOSIS — Z9889 Other specified postprocedural states: Secondary | ICD-10-CM

## 2015-07-08 DIAGNOSIS — I482 Chronic atrial fibrillation, unspecified: Secondary | ICD-10-CM

## 2015-07-08 DIAGNOSIS — Z7901 Long term (current) use of anticoagulants: Secondary | ICD-10-CM | POA: Diagnosis not present

## 2015-07-08 DIAGNOSIS — I6350 Cerebral infarction due to unspecified occlusion or stenosis of unspecified cerebral artery: Secondary | ICD-10-CM | POA: Diagnosis not present

## 2015-07-08 LAB — POCT INR: INR: 3.2

## 2015-08-05 ENCOUNTER — Ambulatory Visit (INDEPENDENT_AMBULATORY_CARE_PROVIDER_SITE_OTHER): Payer: Medicare Other

## 2015-08-05 DIAGNOSIS — I059 Rheumatic mitral valve disease, unspecified: Secondary | ICD-10-CM

## 2015-08-05 DIAGNOSIS — I6350 Cerebral infarction due to unspecified occlusion or stenosis of unspecified cerebral artery: Secondary | ICD-10-CM | POA: Diagnosis not present

## 2015-08-05 DIAGNOSIS — I482 Chronic atrial fibrillation, unspecified: Secondary | ICD-10-CM

## 2015-08-05 DIAGNOSIS — Z9889 Other specified postprocedural states: Secondary | ICD-10-CM

## 2015-08-05 DIAGNOSIS — Z7901 Long term (current) use of anticoagulants: Secondary | ICD-10-CM

## 2015-08-05 DIAGNOSIS — G459 Transient cerebral ischemic attack, unspecified: Secondary | ICD-10-CM | POA: Diagnosis not present

## 2015-08-05 DIAGNOSIS — I4891 Unspecified atrial fibrillation: Secondary | ICD-10-CM | POA: Diagnosis not present

## 2015-08-05 LAB — POCT INR: INR: 3

## 2015-08-24 ENCOUNTER — Other Ambulatory Visit: Payer: Self-pay | Admitting: *Deleted

## 2015-08-24 MED ORDER — ALLOPURINOL 300 MG PO TABS: 300.0000 mg | ORAL_TABLET | Freq: Every day | ORAL | Status: DC | PRN

## 2015-09-02 ENCOUNTER — Ambulatory Visit (INDEPENDENT_AMBULATORY_CARE_PROVIDER_SITE_OTHER)
Admission: RE | Admit: 2015-09-02 | Discharge: 2015-09-02 | Disposition: A | Discharge status: Home / Self Care | Payer: Medicare Other | Source: Ambulatory Visit | Attending: Internal Medicine | Admitting: Internal Medicine

## 2015-09-02 ENCOUNTER — Encounter: Payer: Self-pay | Admitting: Internal Medicine

## 2015-09-02 ENCOUNTER — Ambulatory Visit (INDEPENDENT_AMBULATORY_CARE_PROVIDER_SITE_OTHER): Payer: Medicare Other | Admitting: Internal Medicine

## 2015-09-02 ENCOUNTER — Other Ambulatory Visit (INDEPENDENT_AMBULATORY_CARE_PROVIDER_SITE_OTHER): Payer: Medicare Other

## 2015-09-02 VITALS — BP 150/80 | HR 72 | Temp 98.9°F | Wt 182.0 lb

## 2015-09-02 DIAGNOSIS — J690 Pneumonitis due to inhalation of food and vomit: Secondary | ICD-10-CM | POA: Diagnosis not present

## 2015-09-02 DIAGNOSIS — K920 Hematemesis: Secondary | ICD-10-CM | POA: Diagnosis not present

## 2015-09-02 DIAGNOSIS — A059 Bacterial foodborne intoxication, unspecified: Secondary | ICD-10-CM

## 2015-09-02 DIAGNOSIS — R05 Cough: Secondary | ICD-10-CM

## 2015-09-02 DIAGNOSIS — J189 Pneumonia, unspecified organism: Secondary | ICD-10-CM | POA: Diagnosis not present

## 2015-09-02 DIAGNOSIS — R110 Nausea: Secondary | ICD-10-CM | POA: Diagnosis not present

## 2015-09-02 DIAGNOSIS — R059 Cough, unspecified: Secondary | ICD-10-CM

## 2015-09-02 LAB — CBC WITH DIFFERENTIAL/PLATELET
BASOS PCT: 0.5 % (ref 0.0–3.0)
Basophils Absolute: 0.1 10*3/uL (ref 0.0–0.1)
EOS PCT: 2.1 % (ref 0.0–5.0)
Eosinophils Absolute: 0.3 10*3/uL (ref 0.0–0.7)
HCT: 41.9 % (ref 39.0–52.0)
Hemoglobin: 13.6 g/dL (ref 13.0–17.0)
LYMPHS ABS: 1.5 10*3/uL (ref 0.7–4.0)
Lymphocytes Relative: 12.7 % (ref 12.0–46.0)
MCHC: 32.6 g/dL (ref 30.0–36.0)
MCV: 101.1 fl — AB (ref 78.0–100.0)
MONO ABS: 1.3 10*3/uL — AB (ref 0.1–1.0)
MONOS PCT: 10.7 % (ref 3.0–12.0)
NEUTROS ABS: 8.8 10*3/uL — AB (ref 1.4–7.7)
NEUTROS PCT: 74 % (ref 43.0–77.0)
PLATELETS: 226 10*3/uL (ref 150.0–400.0)
RBC: 4.14 Mil/uL — AB (ref 4.22–5.81)
RDW: 15 % (ref 11.5–15.5)
WBC: 11.9 10*3/uL — ABNORMAL HIGH (ref 4.0–10.5)

## 2015-09-02 LAB — HEPATIC FUNCTION PANEL
ALT: 20 U/L (ref 0–53)
AST: 29 U/L (ref 0–37)
Albumin: 4.5 g/dL (ref 3.5–5.2)
Alkaline Phosphatase: 91 U/L (ref 39–117)
BILIRUBIN DIRECT: 0.3 mg/dL (ref 0.0–0.3)
BILIRUBIN TOTAL: 1.2 mg/dL (ref 0.2–1.2)
Total Protein: 8 g/dL (ref 6.0–8.3)

## 2015-09-02 LAB — BASIC METABOLIC PANEL
BUN: 29 mg/dL — AB (ref 6–23)
CHLORIDE: 96 meq/L (ref 96–112)
CO2: 30 meq/L (ref 19–32)
Calcium: 9.8 mg/dL (ref 8.4–10.5)
Creatinine, Ser: 1.29 mg/dL (ref 0.40–1.50)
GFR: 56.94 mL/min — ABNORMAL LOW (ref >60.00)
GLUCOSE: 109 mg/dL — AB (ref 70–99)
POTASSIUM: 4.7 meq/L (ref 3.5–5.1)
SODIUM: 133 meq/L — AB (ref 135–145)

## 2015-09-02 LAB — PROTIME-INR
INR: 3.1 ratio — ABNORMAL HIGH (ref 0.8–1.0)
Prothrombin Time: 33.2 s — ABNORMAL HIGH (ref 9.6–13.1)

## 2015-09-02 MED ORDER — CEFDINIR 300 MG PO CAPS: 300.0000 mg | ORAL_CAPSULE | Freq: Two times a day (BID) | ORAL | Status: DC

## 2015-09-02 NOTE — Assessment & Plan Note (Signed)
Due to M-W tears - food poisoning 6/17 On [facility]

## 2015-09-02 NOTE — Assessment & Plan Note (Signed)
Due to aspiration/M-W tears - food poisoning 6/17 CXR Labs  Omnicef if cough/fever relapsed

## 2015-09-02 NOTE — Progress Notes (Signed)
Pre visit review using our clinic review tool, if applicable. No additional management support is needed unless otherwise documented below in the visit note. 

## 2015-09-02 NOTE — Progress Notes (Signed)
Subjective:  Patient ID: Anthony Gould, male    DOB: 1934/01/19  Age: 82 y.o. MRN: [id number]  CC: No chief complaint on file.   HPI Anthony Gould presents for n/v after eating mushroom tortellini at [employer] on Tue. The pt threw up x3, felt sick on Wed. He also had a cough and coughed up bloody mucus - resolved. He had a fever of 102. On Thur - felt ok. Today he is feeling normal Not taking Celebrex except for a very rare occasion  Outpatient Prescriptions Prior to Visit  Medication Sig Dispense Refill  . allopurinol (ZYLOPRIM) 300 MG tablet Take 1 tablet (300 mg total) by mouth daily as needed. 90 tablet 1  . aspirin 81 MG EC tablet Take 81 mg by mouth daily.      [name] atorvastatin (LIPITOR) 20 MG tablet Take 1 tablet (20 mg total) by mouth daily. 90 tablet 3  . bisoprolol (ZEBETA) 5 MG tablet TAKE 1/2 BY MOUTH DAILY 45 tablet 3  . celecoxib (CELEBREX) 200 MG capsule Take 200 mg by mouth. Reported on 05/02/2015    . diltiazem (CARDIZEM CD) 360 MG 24 hr capsule TAKE 1 TABLET BY MOUTH DAILY 90 capsule 2  . dipyridamole (PERSANTINE) 50 MG tablet Take 1 tablet (50 mg total) by mouth 3 (three) times daily. 270 tablet 2  . enalapril (VASOTEC) 20 MG tablet Take 1 tablet (20 mg total) by mouth 2 (two) times daily. 180 tablet 3  . furosemide (LASIX) 40 MG tablet 1 tablet (40mg ) by mouth in the AM and 1/2 tablet (20mg ) by mouth in the PM 135 tablet 3  . latanoprost (XALATAN) 0.005 % ophthalmic solution Place 1 drop into both eyes at bedtime.    . magnesium oxide (MAG-OX) 400 MG tablet Take 400 mg by mouth 3 (three) times daily.      . pantoprazole (PROTONIX) 40 MG tablet Take 1 tablet (40 mg total) by mouth daily. 90 tablet 3  . warfarin (COUMADIN) 5 MG tablet Take as directed by the Anticoagulation Clinic 135 tablet 1  . traMADol (ULTRAM) 50 MG tablet Take 0.5-1 tablets (25-50 mg total) by mouth every 6 (six) hours as needed for moderate pain or severe pain. (Patient not taking: Reported on  09/02/2015) 60 tablet 0  . trimethoprim-polymyxin b (POLYTRIM) ophthalmic solution Place 1 drop into the right eye every 4 (four) hours. (Patient not taking: Reported on 09/02/2015) 10 mL 0   No facility-administered medications prior to visit.    ROS Review of Systems  Constitutional: Negative for appetite change, fatigue and unexpected weight change.  HENT: Negative for congestion, nosebleeds, rhinorrhea, sneezing, sore throat and trouble swallowing.   Eyes: Negative for itching and visual disturbance.  Respiratory: Positive for cough.   Cardiovascular: Negative for chest pain, palpitations and leg swelling.  Gastrointestinal: Positive for nausea and vomiting. Negative for diarrhea, blood in stool and abdominal distention.  Genitourinary: Negative for frequency and hematuria.  Musculoskeletal: Negative for back pain, joint swelling, gait problem and neck pain.  Skin: Negative for rash.  Neurological: Negative for dizziness, tremors, speech difficulty and weakness.  Psychiatric/Behavioral: Negative for sleep disturbance, dysphoric mood and agitation. The patient is not nervous/anxious.     Objective:  BP 150/80 mmHg  Pulse 72  Temp(Src) 98.9 F (37.2 C) (Oral)  Wt 182 lb (82.555 kg)  SpO2 96%  BP Readings from Last 3 Encounters:  09/02/15 150/80  05/02/15 162/74  04/28/15 145/78    Wt Readings from Last  3 Encounters:  09/02/15 182 lb (82.555 kg)  05/02/15 186 lb (84.369 kg)  04/28/15 183 lb (83.008 kg)    Physical Exam  Constitutional: He is oriented to person, place, and time. He appears well-developed. No distress.  NAD  HENT:  Mouth/Throat: Oropharynx is clear and moist.  Eyes: Conjunctivae are normal. Pupils are equal, round, and reactive to light.  Neck: Normal range of motion. No JVD present. No thyromegaly present.  Cardiovascular: Normal rate, regular rhythm, normal heart sounds and intact distal pulses.  Exam reveals no gallop and no friction rub.   No murmur  heard. Pulmonary/Chest: Effort normal and breath sounds normal. No respiratory distress. He has no wheezes. He has no rales. He exhibits no tenderness.  Abdominal: Soft. Bowel sounds are normal. He exhibits no distension and no mass. There is no tenderness. There is no rebound and no guarding.  Musculoskeletal: Normal range of motion. He exhibits no edema or tenderness.  Lymphadenopathy:    He has no cervical adenopathy.  Neurological: He is alert and oriented to person, place, and time. He has normal reflexes. No cranial nerve deficit. He exhibits normal muscle tone. He displays a negative Romberg sign. Coordination and gait normal.  Skin: Skin is warm and dry. No rash noted.  Psychiatric: He has a normal mood and affect. His behavior is normal. Judgment and thought content normal.    Lab Results  Component Value Date   WBC 8.4 04/28/2015   HGB 15.6 04/28/2015   HCT 46.0 04/28/2015   PLT 208 04/28/2015   GLUCOSE 161* 04/28/2015   CHOL 150 10/20/2014   TRIG 87.0 10/20/2014   HDL 70.40 10/20/2014   LDLCALC 62 10/20/2014   ALT 18 10/20/2014   AST 26 10/20/2014   NA 134* 04/28/2015   K 4.3 04/28/2015   CL 99* 04/28/2015   CREATININE 1.20 04/28/2015   BUN 24* 04/28/2015   CO2 25 04/01/2015   TSH 6.69* 10/20/2014   PSA 0.87 10/20/2014   INR 3.0 08/05/2015   HGBA1C 5.6 10/20/2014    Dg Chest 2 View  04/28/2015  CLINICAL DATA:  Nausea EXAM: CHEST  2 VIEW COMPARISON:  None FINDINGS: Cardiomegaly. The patient is status post median sternotomy for mitral valve replacement. Negative aortic and hilar contours. There is no edema, consolidation, effusion, or pneumothorax. Cholecystectomy changes. IMPRESSION: 1. No acute finding. 2. Cardiomegaly. Electronically Signed   By: Monte Fantasia M.D.   On: 04/28/2015 06:21   Dg Hip Unilat With Pelvis 2-3 Views Right  04/28/2015  CLINICAL DATA:  New generalized right hip and groin pain since yesterday. No specific trauma. EXAM: DG HIP (WITH OR WITHOUT  PELVIS) 2-3V RIGHT COMPARISON:  None. FINDINGS: Negative for fracture or dislocation. No erosion or focal bone lesion. Heterotopic ossification asymmetrically about the right ischial tuberosity compatible with remote avulsion injury. No degenerative hip narrowing. Atherosclerosis. IMPRESSION: No acute finding or degenerative joint narrowing. Electronically Signed   By: Monte Fantasia M.D.   On: 04/28/2015 06:17    Assessment & Plan:   There are no diagnoses linked to this encounter. I am having Mr. Flinders maintain his aspirin, magnesium oxide, celecoxib, furosemide, atorvastatin, pantoprazole, trimethoprim-polymyxin b, latanoprost, enalapril, traMADol, warfarin, bisoprolol, diltiazem, dipyridamole, and allopurinol.  No orders of the defined types were placed in this encounter.     Follow-up: No Follow-up on file.  Walker Kehr, MD

## 2015-09-02 NOTE — Assessment & Plan Note (Signed)
6/17 acute

## 2015-09-03 ENCOUNTER — Other Ambulatory Visit: Payer: Self-pay | Admitting: Internal Medicine

## 2015-09-03 DIAGNOSIS — J690 Pneumonitis due to inhalation of food and vomit: Secondary | ICD-10-CM | POA: Insufficient documentation

## 2015-09-03 MED ORDER — CEFDINIR 300 MG PO CAPS: 300.0000 mg | ORAL_CAPSULE | Freq: Two times a day (BID) | ORAL | Status: DC

## 2015-09-03 NOTE — Assessment & Plan Note (Signed)
Cefdinir RTC 4-6 wks

## 2015-09-08 ENCOUNTER — Ambulatory Visit (INDEPENDENT_AMBULATORY_CARE_PROVIDER_SITE_OTHER): Payer: Medicare Other

## 2015-09-08 DIAGNOSIS — G459 Transient cerebral ischemic attack, unspecified: Secondary | ICD-10-CM | POA: Diagnosis not present

## 2015-09-08 DIAGNOSIS — I4891 Unspecified atrial fibrillation: Secondary | ICD-10-CM

## 2015-09-08 DIAGNOSIS — I6350 Cerebral infarction due to unspecified occlusion or stenosis of unspecified cerebral artery: Secondary | ICD-10-CM | POA: Diagnosis not present

## 2015-09-08 DIAGNOSIS — Z7901 Long term (current) use of anticoagulants: Secondary | ICD-10-CM | POA: Diagnosis not present

## 2015-09-08 DIAGNOSIS — Z9889 Other specified postprocedural states: Secondary | ICD-10-CM

## 2015-09-08 DIAGNOSIS — I059 Rheumatic mitral valve disease, unspecified: Secondary | ICD-10-CM

## 2015-09-08 DIAGNOSIS — I482 Chronic atrial fibrillation, unspecified: Secondary | ICD-10-CM

## 2015-09-08 LAB — POCT INR: INR: 2.5

## 2015-09-12 ENCOUNTER — Telehealth: Payer: Self-pay

## 2015-09-12 NOTE — Telephone Encounter (Signed)
Call to schedule apt for AWV; LVM to call and schedule or ask for Manuela Schwartz for questions

## 2015-09-19 ENCOUNTER — Telehealth: Payer: Self-pay | Admitting: Internal Medicine

## 2015-09-19 NOTE — Telephone Encounter (Signed)
No - too soon for a CXR. OV tomorrow Thx

## 2015-09-19 NOTE — Telephone Encounter (Signed)
Notified pt w/MD response.../lmb 

## 2015-09-19 NOTE — Telephone Encounter (Signed)
Patient states that he is having same stomach issues as three weeks ago.  I have scheduled patient for tomorrow at 9:45am.  Patient states that Dr. Camila Li wanted him to have xray in 6 weeks.  He would like to know if Dr. Camila Li wants him to have xray done before appt?

## 2015-09-20 ENCOUNTER — Ambulatory Visit (INDEPENDENT_AMBULATORY_CARE_PROVIDER_SITE_OTHER): Payer: Medicare Other | Admitting: Internal Medicine

## 2015-09-20 ENCOUNTER — Telehealth: Payer: Self-pay

## 2015-09-20 ENCOUNTER — Other Ambulatory Visit (INDEPENDENT_AMBULATORY_CARE_PROVIDER_SITE_OTHER): Payer: Medicare Other

## 2015-09-20 ENCOUNTER — Encounter: Payer: Self-pay | Admitting: Internal Medicine

## 2015-09-20 VITALS — BP 170/90 | HR 90 | Temp 98.3°F | Wt 178.0 lb

## 2015-09-20 DIAGNOSIS — R1110 Vomiting, unspecified: Secondary | ICD-10-CM | POA: Insufficient documentation

## 2015-09-20 DIAGNOSIS — R112 Nausea with vomiting, unspecified: Secondary | ICD-10-CM | POA: Diagnosis not present

## 2015-09-20 DIAGNOSIS — R110 Nausea: Secondary | ICD-10-CM

## 2015-09-20 DIAGNOSIS — R7989 Other specified abnormal findings of blood chemistry: Secondary | ICD-10-CM | POA: Diagnosis not present

## 2015-09-20 DIAGNOSIS — R739 Hyperglycemia, unspecified: Secondary | ICD-10-CM

## 2015-09-20 DIAGNOSIS — K920 Hematemesis: Secondary | ICD-10-CM

## 2015-09-20 LAB — CBC WITH DIFFERENTIAL/PLATELET
BASOS ABS: 0.1 10*3/uL (ref 0.0–0.1)
Basophils Relative: 1.1 % (ref 0.0–3.0)
EOS ABS: 0.3 10*3/uL (ref 0.0–0.7)
Eosinophils Relative: 3.2 % (ref 0.0–5.0)
HEMATOCRIT: 44.5 % (ref 39.0–52.0)
HEMOGLOBIN: 14.8 g/dL (ref 13.0–17.0)
LYMPHS PCT: 16.2 % (ref 12.0–46.0)
Lymphs Abs: 1.5 10*3/uL (ref 0.7–4.0)
MCHC: 33.3 g/dL (ref 30.0–36.0)
MCV: 99.4 fl (ref 78.0–100.0)
MONOS PCT: 7.4 % (ref 3.0–12.0)
Monocytes Absolute: 0.7 10*3/uL (ref 0.1–1.0)
NEUTROS ABS: 6.5 10*3/uL (ref 1.4–7.7)
Neutrophils Relative %: 72.1 % (ref 43.0–77.0)
PLATELETS: 292 10*3/uL (ref 150.0–400.0)
RBC: 4.48 Mil/uL (ref 4.22–5.81)
RDW: 14.6 % (ref 11.5–15.5)
WBC: 9 10*3/uL (ref 4.0–10.5)

## 2015-09-20 LAB — HEMOGLOBIN A1C: HEMOGLOBIN A1C: 5.8 % (ref 4.6–6.5)

## 2015-09-20 LAB — HEPATIC FUNCTION PANEL
ALK PHOS: 95 U/L (ref 39–117)
ALT: 21 U/L (ref 0–53)
AST: 32 U/L (ref 0–37)
Albumin: 4.8 g/dL (ref 3.5–5.2)
BILIRUBIN DIRECT: 0.2 mg/dL (ref 0.0–0.3)
Total Bilirubin: 0.8 mg/dL (ref 0.2–1.2)
Total Protein: 8.4 g/dL — ABNORMAL HIGH (ref 6.0–8.3)

## 2015-09-20 LAB — URIC ACID: URIC ACID, SERUM: 4.5 mg/dL (ref 4.0–7.8)

## 2015-09-20 LAB — BASIC METABOLIC PANEL
BUN: 36 mg/dL — ABNORMAL HIGH (ref 6–23)
CALCIUM: 10.3 mg/dL (ref 8.4–10.5)
CHLORIDE: 97 meq/L (ref 96–112)
CO2: 29 meq/L (ref 19–32)
CREATININE: 1.49 mg/dL (ref 0.40–1.50)
GFR: 48.21 mL/min — ABNORMAL LOW (ref >60.00)
Glucose, Bld: 163 mg/dL — ABNORMAL HIGH (ref 70–99)
Potassium: 4.9 mEq/L (ref 3.5–5.1)
SODIUM: 133 meq/L — AB (ref 135–145)

## 2015-09-20 LAB — PROTIME-INR
INR: 2.5 ratio — ABNORMAL HIGH (ref 0.8–1.0)
PROTHROMBIN TIME: 27.2 s — AB (ref 9.6–13.1)

## 2015-09-20 LAB — T4, FREE: FREE T4: 0.88 ng/dL (ref 0.60–1.60)

## 2015-09-20 LAB — LIPASE: LIPASE: 26 U/L (ref 11.0–59.0)

## 2015-09-20 LAB — MAGNESIUM: MAGNESIUM: 2 mg/dL (ref 1.5–2.5)

## 2015-09-20 LAB — TSH: TSH: 6.61 u[IU]/mL — ABNORMAL HIGH (ref 0.35–4.50)

## 2015-09-20 MED ORDER — ONDANSETRON HCL 4 MG PO TABS: 4.0000 mg | ORAL_TABLET | Freq: Three times a day (TID) | ORAL | Status: DC | PRN

## 2015-09-20 NOTE — Assessment & Plan Note (Signed)
No relapse 

## 2015-09-20 NOTE — Patient Instructions (Addendum)
Hold Lipitor x 1-2 weeks. No wine for now

## 2015-09-20 NOTE — Telephone Encounter (Signed)
PA initiated via CoverMyMeds key [id number]

## 2015-09-20 NOTE — Progress Notes (Signed)
Subjective:  Patient ID: Anthony Gould, male    DOB: [birth year]  Age: 82 y.o. MRN: [id number]  CC: No chief complaint on file.   HPI Anthony Gould presents for n/v after eating dinner at home on fri. The pt threw up x3, felt sick on Sat. He did not a cough. He had a fever of 99. Drinks wine 2 glasses of wine. Taking a lot of Magnesium OTC. H/o abn TSH. He lost wt. Today he is feeling normal, except for some nausea   Outpatient Prescriptions Prior to Visit  Medication Sig Dispense Refill  . allopurinol (ZYLOPRIM) 300 MG tablet Take 1 tablet (300 mg total) by mouth daily as needed. 90 tablet 1  . aspirin 81 MG EC tablet Take 81 mg by mouth daily.      [name] atorvastatin (LIPITOR) 20 MG tablet Take 1 tablet (20 mg total) by mouth daily. 90 tablet 3  . bisoprolol (ZEBETA) 5 MG tablet TAKE 1/2 BY MOUTH DAILY 45 tablet 3  . celecoxib (CELEBREX) 200 MG capsule Take 200 mg by mouth. Reported on 05/02/2015    . diltiazem (CARDIZEM CD) 360 MG 24 hr capsule TAKE 1 TABLET BY MOUTH DAILY 90 capsule 2  . dipyridamole (PERSANTINE) 50 MG tablet Take 1 tablet (50 mg total) by mouth 3 (three) times daily. 270 tablet 2  . enalapril (VASOTEC) 20 MG tablet Take 1 tablet (20 mg total) by mouth 2 (two) times daily. 180 tablet 3  . furosemide (LASIX) 40 MG tablet 1 tablet (40mg ) by mouth in the AM and 1/2 tablet (20mg ) by mouth in the PM 135 tablet 3  . latanoprost (XALATAN) 0.005 % ophthalmic solution Place 1 drop into both eyes at bedtime.    . magnesium oxide (MAG-OX) 400 MG tablet Take 400 mg by mouth 3 (three) times daily.      . pantoprazole (PROTONIX) 40 MG tablet Take 1 tablet (40 mg total) by mouth daily. 90 tablet 3  . warfarin (COUMADIN) 5 MG tablet Take as directed by the Anticoagulation Clinic 135 tablet 1  . cefdinir (OMNICEF) 300 MG capsule Take 1 capsule (300 mg total) by mouth 2 (two) times daily. 20 capsule 0  . traMADol (ULTRAM) 50 MG tablet Take 0.5-1 tablets (25-50 mg total) by mouth  every 6 (six) hours as needed for moderate pain or severe pain. (Patient not taking: Reported on 09/20/2015) 60 tablet 0  . trimethoprim-polymyxin b (POLYTRIM) ophthalmic solution Place 1 drop into the right eye every 4 (four) hours. (Patient not taking: Reported on 09/20/2015) 10 mL 0   No facility-administered medications prior to visit.    ROS Review of Systems  Constitutional: Positive for unexpected weight change. Negative for appetite change and fatigue.  HENT: Negative for congestion, nosebleeds, sneezing, sore throat and trouble swallowing.   Eyes: Negative for itching and visual disturbance.  Respiratory: Negative for cough.   Cardiovascular: Negative for chest pain, palpitations and leg swelling.  Gastrointestinal: Positive for nausea. Negative for diarrhea, blood in stool and abdominal distention.  Genitourinary: Negative for frequency and hematuria.  Musculoskeletal: Negative for back pain, joint swelling, gait problem and neck pain.  Skin: Negative for rash.  Neurological: Negative for dizziness, tremors, speech difficulty and weakness.  Psychiatric/Behavioral: Negative for sleep disturbance, dysphoric mood and agitation. The patient is not nervous/anxious.     Objective:  BP 170/90 mmHg  Pulse 90  Temp(Src) 98.3 F (36.8 C) (Oral)  Wt 178 lb (80.74 kg)  SpO2 97%  BP Readings from Last 3 Encounters:  09/20/15 170/90  09/02/15 150/80  05/02/15 162/74    Wt Readings from Last 3 Encounters:  09/20/15 178 lb (80.74 kg)  09/02/15 182 lb (82.555 kg)  05/02/15 186 lb (84.369 kg)    Physical Exam  Constitutional: He is oriented to person, place, and time. He appears well-developed. No distress.  NAD  HENT:  Mouth/Throat: Oropharynx is clear and moist.  Eyes: Conjunctivae are normal. Pupils are equal, round, and reactive to light.  Neck: Normal range of motion. No JVD present. No thyromegaly present.  Cardiovascular: Normal rate, regular rhythm, normal heart sounds  and intact distal pulses.  Exam reveals no gallop and no friction rub.   No murmur heard. Pulmonary/Chest: Effort normal and breath sounds normal. No respiratory distress. He has no wheezes. He has no rales. He exhibits no tenderness.  Abdominal: Soft. Bowel sounds are normal. He exhibits no distension and no mass. There is no tenderness. There is no rebound and no guarding.  Musculoskeletal: Normal range of motion. He exhibits no edema or tenderness.  Lymphadenopathy:    He has no cervical adenopathy.  Neurological: He is alert and oriented to person, place, and time. He has normal reflexes. No cranial nerve deficit. He exhibits normal muscle tone. He displays a negative Romberg sign. Coordination and gait normal.  Skin: Skin is warm and dry. No rash noted.  Psychiatric: He has a normal mood and affect. His behavior is normal. Judgment and thought content normal.    Lab Results  Component Value Date   WBC 11.9* 09/02/2015   HGB 13.6 09/02/2015   HCT 41.9 09/02/2015   PLT 226.0 09/02/2015   GLUCOSE 109* 09/02/2015   CHOL 150 10/20/2014   TRIG 87.0 10/20/2014   HDL 70.40 10/20/2014   LDLCALC 62 10/20/2014   ALT 20 09/02/2015   AST 29 09/02/2015   NA 133* 09/02/2015   K 4.7 09/02/2015   CL 96 09/02/2015   CREATININE 1.29 09/02/2015   BUN 29* 09/02/2015   CO2 30 09/02/2015   TSH 6.69* 10/20/2014   PSA 0.87 10/20/2014   INR 2.5 09/08/2015   HGBA1C 5.6 10/20/2014    Dg Chest 2 View  09/02/2015  CLINICAL DATA:  Cough with vomiting for the past 2 days; history of mitral valve replacement and CABG. EXAM: CHEST  2 VIEW COMPARISON:  PA and lateral chest x-ray of April 28, 2015 FINDINGS: There is new infiltrate in the right mid lung. This likely lies in the inferior aspect of the upper lobe. The remainder of the right lung is clear as is the left lung. The cardiac silhouette remains enlarged. The pulmonary vascularity is normal. The prosthetic mitral valve ring is in stable position. The  sternal wires are intact. The mediastinum is normal in width. There is no pleural effusion. The observed bony thorax exhibits no acute abnormality. IMPRESSION: Patchy pneumonia inferiorly in the right upper lobe. No evidence of CHF. Followup PA and lateral chest X-ray is recommended in 3-4 weeks following trial of antibiotic therapy to ensure resolution and exclude underlying malignancy. Electronically Signed   By: David  Martinique M.D.   On: 09/02/2015 12:45    Assessment & Plan:   There are no diagnoses linked to this encounter. I have discontinued Mr. Helming's cefdinir. I am also having him maintain his aspirin, magnesium oxide, celecoxib, furosemide, atorvastatin, pantoprazole, trimethoprim-polymyxin b, latanoprost, enalapril, traMADol, warfarin, bisoprolol, diltiazem, dipyridamole, and allopurinol.  No orders of the defined types were placed  in this encounter.     Follow-up: No Follow-up on file.  Walker Kehr, MD

## 2015-09-20 NOTE — Progress Notes (Signed)
Pre visit review using our clinic review tool, if applicable. No additional management support is needed unless otherwise documented below in the visit note. 

## 2015-09-20 NOTE — Assessment & Plan Note (Addendum)
6/17 recurrent x2 ?etiology Labs CT abd Zofran prn Hold Lipitor; no ETOH May need a GI consult RTC 2 wks

## 2015-09-21 MED ORDER — PROMETHAZINE HCL 25 MG PO TABS: 25.0000 mg | ORAL_TABLET | Freq: Three times a day (TID) | ORAL | Status: DC | PRN

## 2015-09-21 NOTE — Telephone Encounter (Signed)
Ok phenergan Thx

## 2015-09-21 NOTE — Telephone Encounter (Signed)
PA DENIED, please advise on alternative medication. Thanks!

## 2015-09-23 ENCOUNTER — Ambulatory Visit (INDEPENDENT_AMBULATORY_CARE_PROVIDER_SITE_OTHER)
Admission: RE | Admit: 2015-09-23 | Discharge: 2015-09-23 | Disposition: A | Discharge status: Home / Self Care | Payer: Medicare Other | Source: Ambulatory Visit | Attending: Internal Medicine | Admitting: Internal Medicine

## 2015-09-23 ENCOUNTER — Other Ambulatory Visit: Payer: Self-pay | Admitting: Internal Medicine

## 2015-09-23 DIAGNOSIS — R112 Nausea with vomiting, unspecified: Secondary | ICD-10-CM

## 2015-09-23 DIAGNOSIS — K920 Hematemesis: Secondary | ICD-10-CM

## 2015-09-23 DIAGNOSIS — R1110 Vomiting, unspecified: Secondary | ICD-10-CM | POA: Diagnosis not present

## 2015-09-23 DIAGNOSIS — K7460 Unspecified cirrhosis of liver: Secondary | ICD-10-CM

## 2015-09-23 DIAGNOSIS — K7581 Nonalcoholic steatohepatitis (NASH): Principal | ICD-10-CM

## 2015-09-23 MED ORDER — IOPAMIDOL (ISOVUE-300) INJECTION 61%
100.0000 mL | Freq: Once | INTRAVENOUS | Status: AC | PRN
Start: 2015-09-23 — End: 2015-09-23
  Administered 2015-09-23: 100 mL via INTRAVENOUS

## 2015-10-03 ENCOUNTER — Telehealth: Payer: Self-pay | Admitting: Gastroenterology

## 2015-10-03 ENCOUNTER — Encounter: Payer: Self-pay | Admitting: Internal Medicine

## 2015-10-03 ENCOUNTER — Ambulatory Visit (INDEPENDENT_AMBULATORY_CARE_PROVIDER_SITE_OTHER): Payer: Medicare Other | Admitting: Internal Medicine

## 2015-10-03 VITALS — BP 140/74 | HR 84 | Temp 98.4°F | Wt 176.0 lb

## 2015-10-03 DIAGNOSIS — R112 Nausea with vomiting, unspecified: Secondary | ICD-10-CM | POA: Diagnosis not present

## 2015-10-03 DIAGNOSIS — R197 Diarrhea, unspecified: Secondary | ICD-10-CM | POA: Insufficient documentation

## 2015-10-03 DIAGNOSIS — K7581 Nonalcoholic steatohepatitis (NASH): Secondary | ICD-10-CM

## 2015-10-03 DIAGNOSIS — E1159 Type 2 diabetes mellitus with other circulatory complications: Secondary | ICD-10-CM

## 2015-10-03 DIAGNOSIS — K7460 Unspecified cirrhosis of liver: Secondary | ICD-10-CM

## 2015-10-03 MED ORDER — PROMETHAZINE HCL 25 MG PO TABS: 25.0000 mg | ORAL_TABLET | Freq: Three times a day (TID) | ORAL | Status: DC | PRN

## 2015-10-03 NOTE — Assessment & Plan Note (Addendum)
Will move up GI appt. The pt would like to see Dr Carlean Purl if possible. No wine CT report was discussed

## 2015-10-03 NOTE — Assessment & Plan Note (Signed)
Cont w/diet 

## 2015-10-03 NOTE — Progress Notes (Signed)
Subjective:  Patient ID: Anthony Gould, male    DOB: 10/09/1933  Age: 82 y.o. MRN: [id number]  CC: No chief complaint on file.   HPI GERASIMOS SCHMITH presents for abn abd CT scan, persistent nausea at night - stopping Lipitor did not help. He is taking Magnesium 3 times a day. He had diarrhea. He eats poorly due to nausea  Outpatient Prescriptions Prior to Visit  Medication Sig Dispense Refill  . allopurinol (ZYLOPRIM) 300 MG tablet Take 1 tablet (300 mg total) by mouth daily as needed. 90 tablet 1  . aspirin 81 MG EC tablet Take 81 mg by mouth daily.      [name] atorvastatin (LIPITOR) 20 MG tablet Take 1 tablet (20 mg total) by mouth daily. 90 tablet 3  . bisoprolol (ZEBETA) 5 MG tablet TAKE 1/2 BY MOUTH DAILY 45 tablet 3  . celecoxib (CELEBREX) 200 MG capsule Take 200 mg by mouth. Reported on 05/02/2015    . diltiazem (CARDIZEM CD) 360 MG 24 hr capsule TAKE 1 TABLET BY MOUTH DAILY 90 capsule 2  . dipyridamole (PERSANTINE) 50 MG tablet Take 1 tablet (50 mg total) by mouth 3 (three) times daily. 270 tablet 2  . enalapril (VASOTEC) 20 MG tablet Take 1 tablet (20 mg total) by mouth 2 (two) times daily. 180 tablet 3  . furosemide (LASIX) 40 MG tablet 1 tablet (40mg ) by mouth in the AM and 1/2 tablet (20mg ) by mouth in the PM 135 tablet 3  . latanoprost (XALATAN) 0.005 % ophthalmic solution Place 1 drop into both eyes at bedtime.    . magnesium oxide (MAG-OX) 400 MG tablet Take 400 mg by mouth 3 (three) times daily.      . pantoprazole (PROTONIX) 40 MG tablet Take 1 tablet (40 mg total) by mouth daily. 90 tablet 3  . promethazine (PHENERGAN) 25 MG tablet Take 1 tablet (25 mg total) by mouth every 8 (eight) hours as needed for nausea or vomiting. 20 tablet 0  . traMADol (ULTRAM) 50 MG tablet Take 0.5-1 tablets (25-50 mg total) by mouth every 6 (six) hours as needed for moderate pain or severe pain. 60 tablet 0  . trimethoprim-polymyxin b (POLYTRIM) ophthalmic solution Place 1 drop into the right  eye every 4 (four) hours. 10 mL 0  . warfarin (COUMADIN) 5 MG tablet Take as directed by the Anticoagulation Clinic 135 tablet 1   No facility-administered medications prior to visit.    ROS Review of Systems  Constitutional: Positive for unexpected weight change. Negative for appetite change and fatigue.  HENT: Negative for congestion, nosebleeds, sneezing, sore throat and trouble swallowing.   Eyes: Negative for itching and visual disturbance.  Respiratory: Negative for cough.   Cardiovascular: Negative for chest pain, palpitations and leg swelling.  Gastrointestinal: Positive for nausea and diarrhea. Negative for blood in stool and abdominal distention.  Genitourinary: Negative for frequency and hematuria.  Musculoskeletal: Negative for back pain, joint swelling, gait problem and neck pain.  Skin: Negative for rash.  Neurological: Negative for dizziness, tremors, speech difficulty and weakness.  Psychiatric/Behavioral: Negative for sleep disturbance, dysphoric mood and agitation. The patient is not nervous/anxious.     Objective:  BP 140/74 mmHg  Pulse 84  Temp(Src) 98.4 F (36.9 C) (Oral)  Wt 176 lb (79.833 kg)  SpO2 97%  BP Readings from Last 3 Encounters:  10/03/15 140/74  09/20/15 170/90  09/02/15 150/80    Wt Readings from Last 3 Encounters:  10/03/15 176 lb (79.833 kg)  09/20/15 178 lb (80.74 kg)  09/02/15 182 lb (82.555 kg)    Physical Exam  Constitutional: He is oriented to person, place, and time. He appears well-developed. No distress.  NAD  HENT:  Mouth/Throat: Oropharynx is clear and moist.  Eyes: Conjunctivae are normal. Pupils are equal, round, and reactive to light.  Neck: Normal range of motion. No JVD present. No thyromegaly present.  Cardiovascular: Normal rate, regular rhythm, normal heart sounds and intact distal pulses.  Exam reveals no gallop and no friction rub.   No murmur heard. Pulmonary/Chest: Effort normal and breath sounds normal. No  respiratory distress. He has no wheezes. He has no rales. He exhibits no tenderness.  Abdominal: Soft. Bowel sounds are normal. He exhibits no distension and no mass. There is no tenderness. There is no rebound and no guarding.  Musculoskeletal: Normal range of motion. He exhibits no edema or tenderness.  Lymphadenopathy:    He has no cervical adenopathy.  Neurological: He is alert and oriented to person, place, and time. He has normal reflexes. No cranial nerve deficit. He exhibits normal muscle tone. He displays a negative Romberg sign. Coordination and gait normal.  Skin: Skin is warm and dry. No rash noted.  Psychiatric: He has a normal mood and affect. His behavior is normal. Judgment and thought content normal.    Lab Results  Component Value Date   WBC 9.0 09/20/2015   HGB 14.8 09/20/2015   HCT 44.5 09/20/2015   PLT 292.0 09/20/2015   GLUCOSE 163* 09/20/2015   CHOL 150 10/20/2014   TRIG 87.0 10/20/2014   HDL 70.40 10/20/2014   LDLCALC 62 10/20/2014   ALT 21 09/20/2015   AST 32 09/20/2015   NA 133* 09/20/2015   K 4.9 09/20/2015   CL 97 09/20/2015   CREATININE 1.49 09/20/2015   BUN 36* 09/20/2015   CO2 29 09/20/2015   TSH 6.61* 09/20/2015   PSA 0.87 10/20/2014   INR 2.5* 09/20/2015   HGBA1C 5.8 09/20/2015    Ct Abdomen Pelvis W Contrast  09/23/2015  CLINICAL DATA:  Two episodes of nausea and vomiting. Fever. Rule out pancreatitis. EXAM: CT ABDOMEN AND PELVIS WITH CONTRAST TECHNIQUE: Multidetector CT imaging of the abdomen and pelvis was performed using the standard protocol following bolus administration of intravenous contrast. CONTRAST:  181mL ISOVUE-300 IOPAMIDOL (ISOVUE-300) INJECTION 61% COMPARISON:  None. FINDINGS: Lower chest and abdominal wall: Cardiomegaly with partly visualized chamber enlargement and mitral valve replacement. Hepatobiliary: Cirrhotic liver morphology. Negative for mass for ascites. Noted history of hematemesis. No visible varix. Cholecystectomy.  Normal diameter common bile duct. Pancreas: Coarse calcification in the head which is likely distinct from the ducts. No ductal enlargement or acute inflammation. Spleen: Unremarkable. Adrenals/Urinary Tract: Negative adrenals. No hydronephrosis or stone. Interpolar right renal cortical scarring. Unremarkable bladder. Stomach/Bowel: No obstruction. No inflammatory features. Appendix not clearly visualized. Reproductive:Negative. Vascular/Lymphatic: Aortic and branch vessel atherosclerosis. No high-grade SMA or celiac stenosis to suggest mesenteric ischemia. Patent portal venous system. No mass or adenopathy. Other: No ascites or pneumoperitoneum. Musculoskeletal: Spondylosis and usual degenerative changes. IMPRESSION: 1. No explanation for vomiting.  Negative for acute pancreatitis. 2. 3. Cirrhosis of liver. [phone]). No ascites or visible varix. 4.  Aortic Atherosclerosis (ICD10-170.0) 5. Additional chronic findings described above. Electronically Signed   By: Monte Fantasia M.D.   On: 09/23/2015 10:20    Assessment & Plan:   There are no diagnoses linked to this encounter. I am having Mr. Quincy maintain his aspirin, magnesium oxide, celecoxib, furosemide,  atorvastatin, pantoprazole, trimethoprim-polymyxin b, latanoprost, enalapril, traMADol, warfarin, bisoprolol, diltiazem, dipyridamole, allopurinol, and promethazine.  No orders of the defined types were placed in this encounter.     Follow-up: No Follow-up on file.  Walker Kehr, MD

## 2015-10-03 NOTE — Assessment & Plan Note (Signed)
D/c magnesium GI ref

## 2015-10-03 NOTE — Progress Notes (Signed)
Pre visit review using our clinic review tool, if applicable. No additional management support is needed unless otherwise documented below in the visit note. 

## 2015-10-03 NOTE — Assessment & Plan Note (Addendum)
recurrent - ?etiology stopping Lipitor did not help. He is taking Magnesium 3 times a day - will stop Will move up GI appt. The pt would like to see Dr Carlean Purl if possible. No wine CT report was discussed Promethazine prn; Zofran - not covered

## 2015-10-03 NOTE — Telephone Encounter (Signed)
I have spoken with the patient and scheduled him to see Alonza Bogus on 10/11/15 Thank you

## 2015-10-04 ENCOUNTER — Telehealth: Payer: Self-pay

## 2015-10-04 NOTE — Telephone Encounter (Signed)
Prior auth for Dipyridamole 75mg  submitted to [facility].

## 2015-10-05 ENCOUNTER — Telehealth: Payer: Self-pay

## 2015-10-05 ENCOUNTER — Other Ambulatory Visit: Payer: Self-pay

## 2015-10-05 DIAGNOSIS — I5032 Chronic diastolic (congestive) heart failure: Secondary | ICD-10-CM

## 2015-10-05 MED ORDER — FUROSEMIDE 40 MG PO TABS
ORAL_TABLET | ORAL | Status: DC
Start: 2015-10-05 — End: 2015-10-07

## 2015-10-05 NOTE — Telephone Encounter (Signed)
Dipyridamole 75 mg approved by [employer] till 10/03/2016.

## 2015-10-07 ENCOUNTER — Ambulatory Visit (INDEPENDENT_AMBULATORY_CARE_PROVIDER_SITE_OTHER): Payer: Medicare Other

## 2015-10-07 ENCOUNTER — Other Ambulatory Visit: Payer: Self-pay | Admitting: *Deleted

## 2015-10-07 DIAGNOSIS — G459 Transient cerebral ischemic attack, unspecified: Secondary | ICD-10-CM | POA: Diagnosis not present

## 2015-10-07 DIAGNOSIS — Z7901 Long term (current) use of anticoagulants: Secondary | ICD-10-CM

## 2015-10-07 DIAGNOSIS — Z9889 Other specified postprocedural states: Secondary | ICD-10-CM

## 2015-10-07 DIAGNOSIS — I059 Rheumatic mitral valve disease, unspecified: Secondary | ICD-10-CM

## 2015-10-07 DIAGNOSIS — I4891 Unspecified atrial fibrillation: Secondary | ICD-10-CM

## 2015-10-07 DIAGNOSIS — I5032 Chronic diastolic (congestive) heart failure: Secondary | ICD-10-CM

## 2015-10-07 DIAGNOSIS — I482 Chronic atrial fibrillation, unspecified: Secondary | ICD-10-CM

## 2015-10-07 DIAGNOSIS — I6350 Cerebral infarction due to unspecified occlusion or stenosis of unspecified cerebral artery: Secondary | ICD-10-CM

## 2015-10-07 LAB — POCT INR: INR: 2

## 2015-10-07 MED ORDER — FUROSEMIDE 40 MG PO TABS: ORAL_TABLET | ORAL | Status: DC

## 2015-10-11 ENCOUNTER — Ambulatory Visit: Payer: Medicare Other | Admitting: Gastroenterology

## 2015-10-12 ENCOUNTER — Other Ambulatory Visit (INDEPENDENT_AMBULATORY_CARE_PROVIDER_SITE_OTHER): Payer: Medicare Other

## 2015-10-12 ENCOUNTER — Encounter: Payer: Self-pay | Admitting: Gastroenterology

## 2015-10-12 ENCOUNTER — Ambulatory Visit (INDEPENDENT_AMBULATORY_CARE_PROVIDER_SITE_OTHER): Payer: Medicare Other | Admitting: Gastroenterology

## 2015-10-12 VITALS — BP 130/70 | HR 68 | Ht 70.0 in | Wt 180.0 lb

## 2015-10-12 DIAGNOSIS — K7460 Unspecified cirrhosis of liver: Secondary | ICD-10-CM | POA: Diagnosis not present

## 2015-10-12 DIAGNOSIS — R112 Nausea with vomiting, unspecified: Secondary | ICD-10-CM | POA: Diagnosis not present

## 2015-10-12 LAB — IBC PANEL
Iron: 95 ug/dL (ref 42–165)
SATURATION RATIOS: 25 % (ref 20.0–50.0)
Transferrin: 271 mg/dL (ref 212.0–360.0)

## 2015-10-12 LAB — FERRITIN: FERRITIN: 159.3 ng/mL (ref 22.0–322.0)

## 2015-10-12 MED ORDER — PANTOPRAZOLE SODIUM 40 MG PO TBEC: 40.0000 mg | DELAYED_RELEASE_TABLET | Freq: Two times a day (BID) | ORAL | Status: DC

## 2015-10-12 NOTE — Progress Notes (Signed)
10/12/2015 Anthony Gould [id number] [birth year]   HISTORY OF PRESENT ILLNESS:  This is an 82 year old male who is new to our practice and was referred by Dr. Alain Marion.  Has history of mechanical heart valve for which he is on coumadin (other PMH listed below).  Is here today with a couple of issues. First, he had some episodic nausea on four occasions since June 6, two of those episodes resulted in vomiting. He says that both times that he vomited he had drank some wine earlier in the evening. The days that the episodes of nausea only occurred he did not have any wine. He denies any other associated symptoms including abdominal pain. He does have reflux for which he takes Protonix 40 mg daily. He says that these episodes tend to occur at night when he lays down to go to bed. CT scan of the abdomen and pelvis with contrast was performed and showed no explanation for the vomiting, but did show cirrhosis of the liver with no ascites or visible varix. This is a new diagnosis for him. He admits to moderate alcohol use over the years with 2 glasses of wine almost every night for several years.  CBC normal including platelets. Hepatic function panel normal.  Past Medical History  Diagnosis Date  . Hyperlipidemia   . HTN (hypertension)   . Atrial fibrillation ([place])     last echo 11/22/06 - EF 50-55%, mild dilation LV, mild MR w/64mmHg gradient   . Type II or unspecified type diabetes mellitus without mention of complication, not stated as uncontrolled     lifestyle mangement  . Peripheral edema     venous insufficiency  . Elevated TSH   . TIA (transient ischemic attack)     while on coumadin  . Ventricular ectopy     symptomatic  . Normal nuclear stress test 12/25/01    no ischemia  . 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 Blood transfusion without reported diagnosis    Past Surgical History  Procedure Laterality Date  . Mitral valve replacement      Hall  mechanical valve due to ruptured chordae  . Cholecystectomy, laparoscopic  2003    reports that he has never smoked. He has never used smokeless tobacco. He reports that he does not drink alcohol or use illicit drugs. family history includes Coronary artery disease in his father and other; Diabetes in his other. There is no history of Prostate cancer or Colon cancer. Allergies  Allergen Reactions  . Sulfonamide Derivatives       Outpatient Encounter Prescriptions as of 10/12/2015  Medication Sig  . allopurinol (ZYLOPRIM) 300 MG tablet Take 1 tablet (300 mg total) by mouth daily as needed.  [name] aspirin 81 MG EC tablet Take 81 mg by mouth daily.    . bisoprolol (ZEBETA) 5 MG tablet TAKE 1/2 BY MOUTH DAILY  . diltiazem (CARDIZEM CD) 360 MG 24 hr capsule TAKE 1 TABLET BY MOUTH DAILY  . dipyridamole (PERSANTINE) 50 MG tablet Take 1 tablet (50 mg total) by mouth 3 (three) times daily.  . enalapril (VASOTEC) 20 MG tablet Take 1 tablet (20 mg total) by mouth 2 (two) times daily.  . furosemide (LASIX) 40 MG tablet Take 1 tablet by mouth in the AM and 1/2 tablet by mouth in the PM  . latanoprost (XALATAN) 0.005 % ophthalmic solution Place 1 drop into both eyes at bedtime.  . pantoprazole (PROTONIX)  40 MG tablet Take 1 tablet (40 mg total) by mouth 2 (two) times daily.  . promethazine (PHENERGAN) 25 MG tablet Take 1 tablet (25 mg total) by mouth every 8 (eight) hours as needed for nausea or vomiting.  . warfarin (COUMADIN) 5 MG tablet Take as directed by the Anticoagulation Clinic  . [DISCONTINUED] pantoprazole (PROTONIX) 40 MG tablet Take 1 tablet (40 mg total) by mouth daily.  . [DISCONTINUED] atorvastatin (LIPITOR) 20 MG tablet Take 1 tablet (20 mg total) by mouth daily.  . [DISCONTINUED] celecoxib (CELEBREX) 200 MG capsule Take 200 mg by mouth. Reported on 05/02/2015  . [DISCONTINUED] magnesium oxide (MAG-OX) 400 MG tablet Take 400 mg by mouth 3 (three) times daily.    . [DISCONTINUED] traMADol  (ULTRAM) 50 MG tablet Take 0.5-1 tablets (25-50 mg total) by mouth every 6 (six) hours as needed for moderate pain or severe pain.  . [DISCONTINUED] trimethoprim-polymyxin b (POLYTRIM) ophthalmic solution Place 1 drop into the right eye every 4 (four) hours.   No facility-administered encounter medications on file as of 10/12/2015.     REVIEW OF SYSTEMS  : All other systems reviewed and negative except where noted in the History of Present Illness.   PHYSICAL EXAM: BP 130/70 mmHg  Pulse 68  Ht 5\' 10"  (1.778 m)  Wt 180 lb (81.647 kg)  BMI 25.83 kg/m2 General: Well developed white male in no acute distress Head: Normocephalic and atraumatic Eyes:  Sclerae anicteric, conjunctiva pink. Ears: Normal auditory acuity Lungs: Clear throughout to auscultation Heart: Regular rate and rhythm Abdomen: Soft, non-distended.  Normal bowel sounds.  Non-tender. Musculoskeletal: Symmetrical with no gross deformities  Skin: No lesions on visible extremities Extremities: No edema  Neurological: Alert oriented x 4, grossly non-focal Psychological:  Alert and cooperative. Normal mood and affect  ASSESSMENT AND PLAN: -Cirrhosis:  New diagnosis on CT scan.  Likely from [place] versus EtOH. No common getting factor suggesting portal hypertension or other decompensation.  We'll perform some serologic testing to rule out other causes of chronic liver disease.  He is a 32 been avoiding alcohol and plans to continue to do so.  Will check AFP as well. -Nausea and two episodes of vomiting:  ? Reflux related since it tends to occur at night when he lies back. CT scan not suggesting any other issues. Offered him an endoscopy but he declined for now with his history of mechanical heart valve and Coumadin requirement. He did agree to increase his Protonix to twice daily for now and see if that helps. Tells me that if the episodes continue he would reconsider endoscopy. Will follow up with Dr. Carlean Purl in approximately 8 weeks  or sooner if needed.   CC:  Plotnikov, Evie Lacks, MD

## 2015-10-12 NOTE — Patient Instructions (Addendum)
Please go to the basement level to have your labs drawn.  We have given you a printed prescription for Pantoprazole sodium 40 mg to take to your pharmacy.  We made you a follow up appointment with Dr. Carlean Purl for 12-19-2015 at 4:00 PM.

## 2015-10-13 LAB — ANA: ANA: NEGATIVE

## 2015-10-13 LAB — ANTI-SMOOTH MUSCLE ANTIBODY, IGG

## 2015-10-13 LAB — HEPATITIS A ANTIBODY, TOTAL: Hep A Total Ab: REACTIVE — AB

## 2015-10-13 LAB — HEPATITIS B SURFACE ANTIGEN: HEP B S AG: NEGATIVE

## 2015-10-13 LAB — HEPATITIS B SURFACE ANTIBODY,QUALITATIVE: HEP B S AB: NEGATIVE

## 2015-10-13 LAB — HEPATITIS C ANTIBODY: HCV AB: NEGATIVE

## 2015-10-13 LAB — AFP TUMOR MARKER: AFP TUMOR MARKER: 2.5 ng/mL (ref <6.1)

## 2015-10-17 DIAGNOSIS — H2513 Age-related nuclear cataract, bilateral: Secondary | ICD-10-CM | POA: Diagnosis not present

## 2015-10-17 DIAGNOSIS — H40053 Ocular hypertension, bilateral: Secondary | ICD-10-CM | POA: Diagnosis not present

## 2015-10-21 ENCOUNTER — Ambulatory Visit (INDEPENDENT_AMBULATORY_CARE_PROVIDER_SITE_OTHER): Payer: Medicare Other | Admitting: Pharmacist

## 2015-10-21 DIAGNOSIS — G459 Transient cerebral ischemic attack, unspecified: Secondary | ICD-10-CM

## 2015-10-21 DIAGNOSIS — I059 Rheumatic mitral valve disease, unspecified: Secondary | ICD-10-CM

## 2015-10-21 DIAGNOSIS — I6350 Cerebral infarction due to unspecified occlusion or stenosis of unspecified cerebral artery: Secondary | ICD-10-CM | POA: Diagnosis not present

## 2015-10-21 DIAGNOSIS — Z7901 Long term (current) use of anticoagulants: Secondary | ICD-10-CM

## 2015-10-21 DIAGNOSIS — Z9889 Other specified postprocedural states: Secondary | ICD-10-CM

## 2015-10-21 DIAGNOSIS — I4891 Unspecified atrial fibrillation: Secondary | ICD-10-CM | POA: Diagnosis not present

## 2015-10-21 LAB — POCT INR: INR: 4

## 2015-11-11 ENCOUNTER — Ambulatory Visit (INDEPENDENT_AMBULATORY_CARE_PROVIDER_SITE_OTHER): Payer: Medicare Other | Admitting: Pharmacist

## 2015-11-11 DIAGNOSIS — G459 Transient cerebral ischemic attack, unspecified: Secondary | ICD-10-CM

## 2015-11-11 DIAGNOSIS — I059 Rheumatic mitral valve disease, unspecified: Secondary | ICD-10-CM

## 2015-11-11 DIAGNOSIS — I6350 Cerebral infarction due to unspecified occlusion or stenosis of unspecified cerebral artery: Secondary | ICD-10-CM | POA: Diagnosis not present

## 2015-11-11 DIAGNOSIS — Z9889 Other specified postprocedural states: Secondary | ICD-10-CM

## 2015-11-11 DIAGNOSIS — I4891 Unspecified atrial fibrillation: Secondary | ICD-10-CM

## 2015-11-11 DIAGNOSIS — Z7901 Long term (current) use of anticoagulants: Secondary | ICD-10-CM

## 2015-11-11 LAB — POCT INR: INR: 3.5

## 2015-11-14 ENCOUNTER — Encounter: Payer: Self-pay | Admitting: Internal Medicine

## 2015-11-14 ENCOUNTER — Ambulatory Visit (INDEPENDENT_AMBULATORY_CARE_PROVIDER_SITE_OTHER)
Admission: RE | Admit: 2015-11-14 | Discharge: 2015-11-14 | Disposition: A | Discharge status: Home / Self Care | Payer: Medicare Other | Source: Ambulatory Visit | Attending: Internal Medicine | Admitting: Internal Medicine

## 2015-11-14 ENCOUNTER — Ambulatory Visit (INDEPENDENT_AMBULATORY_CARE_PROVIDER_SITE_OTHER): Payer: Medicare Other | Admitting: Internal Medicine

## 2015-11-14 VITALS — BP 168/64 | HR 70 | Wt 178.0 lb

## 2015-11-14 DIAGNOSIS — I5032 Chronic diastolic (congestive) heart failure: Secondary | ICD-10-CM

## 2015-11-14 DIAGNOSIS — Z23 Encounter for immunization: Secondary | ICD-10-CM

## 2015-11-14 DIAGNOSIS — I480 Paroxysmal atrial fibrillation: Secondary | ICD-10-CM | POA: Diagnosis not present

## 2015-11-14 DIAGNOSIS — E1159 Type 2 diabetes mellitus with other circulatory complications: Secondary | ICD-10-CM | POA: Diagnosis not present

## 2015-11-14 DIAGNOSIS — R112 Nausea with vomiting, unspecified: Secondary | ICD-10-CM

## 2015-11-14 DIAGNOSIS — J189 Pneumonia, unspecified organism: Secondary | ICD-10-CM | POA: Diagnosis not present

## 2015-11-14 DIAGNOSIS — E785 Hyperlipidemia, unspecified: Secondary | ICD-10-CM

## 2015-11-14 DIAGNOSIS — K7460 Unspecified cirrhosis of liver: Secondary | ICD-10-CM | POA: Diagnosis not present

## 2015-11-14 DIAGNOSIS — K7581 Nonalcoholic steatohepatitis (NASH): Secondary | ICD-10-CM

## 2015-11-14 MED ORDER — MILK THISTLE 1000 MG PO CAPS: ORAL_CAPSULE | ORAL | 3 refills | Status: DC

## 2015-11-14 NOTE — Assessment & Plan Note (Addendum)
ASA, Lipitor Labs 

## 2015-11-14 NOTE — Addendum Note (Signed)
Addended by: Cresenciano Lick on: 11/14/2015 11:40 AM   Modules accepted: Orders

## 2015-11-14 NOTE — Progress Notes (Signed)
Pre visit review using our clinic review tool, if applicable. No additional management support is needed unless otherwise documented below in the visit note. 

## 2015-11-14 NOTE — Assessment & Plan Note (Signed)
Bisoprolol, Diltiazem, ASA, Coumadin 

## 2015-11-14 NOTE — Assessment & Plan Note (Signed)
Off Lipitor Re-start later 2/week

## 2015-11-14 NOTE — Assessment & Plan Note (Signed)
?  etiology:NASH vs alcoholic CT w/liver cirrhosis  Dr Carlean Purl No ETOH

## 2015-11-14 NOTE — Assessment & Plan Note (Signed)
?  etiology:NASH vs alcoholic No ETOH now Dr Carlean Purl

## 2015-11-14 NOTE — Progress Notes (Signed)
Subjective:  Patient ID: Anthony Gould, male    DOB: 1933/06/03  Age: 82 y.o. MRN: [id number]  CC: No chief complaint on file.   HPI DENVIL ALEXIOU presents for n/v spells, HTN, GERD, dyslipidemia and NASH vs alcoholic chirrosis.  Outpatient Medications Prior to Visit  Medication Sig Dispense Refill  . allopurinol (ZYLOPRIM) 300 MG tablet Take 1 tablet (300 mg total) by mouth daily as needed. 90 tablet 1  . aspirin 81 MG EC tablet Take 81 mg by mouth daily.      . bisoprolol (ZEBETA) 5 MG tablet TAKE 1/2 BY MOUTH DAILY 45 tablet 3  . diltiazem (CARDIZEM CD) 360 MG 24 hr capsule TAKE 1 TABLET BY MOUTH DAILY 90 capsule 2  . dipyridamole (PERSANTINE) 50 MG tablet Take 1 tablet (50 mg total) by mouth 3 (three) times daily. 270 tablet 2  . enalapril (VASOTEC) 20 MG tablet Take 1 tablet (20 mg total) by mouth 2 (two) times daily. 180 tablet 3  . furosemide (LASIX) 40 MG tablet Take 1 tablet by mouth in the AM and 1/2 tablet by mouth in the PM 135 tablet 1  . latanoprost (XALATAN) 0.005 % ophthalmic solution Place 1 drop into both eyes at bedtime.    . pantoprazole (PROTONIX) 40 MG tablet Take 1 tablet (40 mg total) by mouth 2 (two) times daily. 60 tablet 2  . promethazine (PHENERGAN) 25 MG tablet Take 1 tablet (25 mg total) by mouth every 8 (eight) hours as needed for nausea or vomiting. 20 tablet 1  . warfarin (COUMADIN) 5 MG tablet Take as directed by the Anticoagulation Clinic 135 tablet 1   No facility-administered medications prior to visit.     ROS Review of Systems  Constitutional: Negative for appetite change, fatigue and unexpected weight change.  HENT: Negative for congestion, nosebleeds, sneezing, sore throat and trouble swallowing.   Eyes: Negative for itching and visual disturbance.  Respiratory: Negative for cough.   Cardiovascular: Negative for chest pain, palpitations and leg swelling.  Gastrointestinal: Negative for abdominal distention, blood in stool, diarrhea  and nausea.  Genitourinary: Negative for frequency and hematuria.  Musculoskeletal: Negative for back pain, gait problem, joint swelling and neck pain.  Skin: Negative for rash.  Neurological: Negative for dizziness, tremors, speech difficulty and weakness.  Psychiatric/Behavioral: Negative for agitation, dysphoric mood and sleep disturbance. The patient is not nervous/anxious.     Objective:  BP (!) 168/64   Pulse 70   Wt 178 lb (80.7 kg)   SpO2 98%   BMI 25.54 kg/m   BP Readings from Last 3 Encounters:  11/14/15 (!) 168/64  10/12/15 130/70  10/03/15 140/74    Wt Readings from Last 3 Encounters:  11/14/15 178 lb (80.7 kg)  10/12/15 180 lb (81.6 kg)  10/03/15 176 lb (79.8 kg)    Physical Exam  Constitutional: He is oriented to person, place, and time. He appears well-developed. No distress.  NAD  HENT:  Mouth/Throat: Oropharynx is clear and moist.  Eyes: Conjunctivae are normal. Pupils are equal, round, and reactive to light.  Neck: Normal range of motion. No JVD present. No thyromegaly present.  Cardiovascular: Normal rate, regular rhythm, normal heart sounds and intact distal pulses.  Exam reveals no gallop and no friction rub.   No murmur heard. Pulmonary/Chest: Effort normal and breath sounds normal. No respiratory distress. He has no wheezes. He has no rales. He exhibits no tenderness.  Abdominal: Soft. Bowel sounds are normal. He exhibits no distension  and no mass. There is no tenderness. There is no rebound and no guarding.  Musculoskeletal: Normal range of motion. He exhibits no edema or tenderness.  Lymphadenopathy:    He has no cervical adenopathy.  Neurological: He is alert and oriented to person, place, and time. He has normal reflexes. No cranial nerve deficit. He exhibits normal muscle tone. He displays a negative Romberg sign. Coordination and gait normal.  Skin: Skin is warm and dry. No rash noted.  Psychiatric: He has a normal mood and affect. His behavior  is normal. Judgment and thought content normal.    Lab Results  Component Value Date   WBC 9.0 09/20/2015   HGB 14.8 09/20/2015   HCT 44.5 09/20/2015   PLT 292.0 09/20/2015   GLUCOSE 163 (H) 09/20/2015   CHOL 150 10/20/2014   TRIG 87.0 10/20/2014   HDL 70.40 10/20/2014   LDLCALC 62 10/20/2014   ALT 21 09/20/2015   AST 32 09/20/2015   NA 133 (L) 09/20/2015   K 4.9 09/20/2015   CL 97 09/20/2015   CREATININE 1.49 09/20/2015   BUN 36 (H) 09/20/2015   CO2 29 09/20/2015   TSH 6.61 (H) 09/20/2015   PSA 0.87 10/20/2014   INR 3.5 11/11/2015   HGBA1C 5.8 09/20/2015    Ct Abdomen Pelvis W Contrast  Result Date: 09/23/2015 CLINICAL DATA:  Two episodes of nausea and vomiting. Fever. Rule out pancreatitis. EXAM: CT ABDOMEN AND PELVIS WITH CONTRAST TECHNIQUE: Multidetector CT imaging of the abdomen and pelvis was performed using the standard protocol following bolus administration of intravenous contrast. CONTRAST:  172mL ISOVUE-300 IOPAMIDOL (ISOVUE-300) INJECTION 61% COMPARISON:  None. FINDINGS: Lower chest and abdominal wall: Cardiomegaly with partly visualized chamber enlargement and mitral valve replacement. Hepatobiliary: Cirrhotic liver morphology. Negative for mass for ascites. Noted history of hematemesis. No visible varix. Cholecystectomy. Normal diameter common bile duct. Pancreas: Coarse calcification in the head which is likely distinct from the ducts. No ductal enlargement or acute inflammation. Spleen: Unremarkable. Adrenals/Urinary Tract: Negative adrenals. No hydronephrosis or stone. Interpolar right renal cortical scarring. Unremarkable bladder. Stomach/Bowel: No obstruction. No inflammatory features. Appendix not clearly visualized. Reproductive:Negative. Vascular/Lymphatic: Aortic and branch vessel atherosclerosis. No high-grade SMA or celiac stenosis to suggest mesenteric ischemia. Patent portal venous system. No mass or adenopathy. Other: No ascites or pneumoperitoneum.  Musculoskeletal: Spondylosis and usual degenerative changes. IMPRESSION: 1. No explanation for vomiting.  Negative for acute pancreatitis. 2. 3. Cirrhosis of liver. [id number]). No ascites or visible varix. 4.  Aortic Atherosclerosis (ICD10-170.0) 5. Additional chronic findings described above. Electronically Signed   By: Monte Fantasia M.D.   On: 09/23/2015 10:20    Assessment & Plan:   There are no diagnoses linked to this encounter. I am having Mr. Cullers maintain his aspirin, latanoprost, enalapril, warfarin, bisoprolol, diltiazem, dipyridamole, allopurinol, promethazine, furosemide, and pantoprazole.  No orders of the defined types were placed in this encounter.    Follow-up: No Follow-up on file.  Walker Kehr, MD

## 2015-11-14 NOTE — Assessment & Plan Note (Signed)
?  etiology 2017 CT w/liver cirrhosis  Dr Carlean Purl

## 2015-11-14 NOTE — Assessment & Plan Note (Signed)
On Diltiazem, Bisoprolol, Enalapril, Furosemide, Coumadin, ASA 

## 2015-11-23 NOTE — Progress Notes (Signed)
Agree with Ms. Alphia Kava management.  Please have staff call him to schedule a f/u - does not have one - Oct/Nov OK  Gatha Mayer, MD, [facility]

## 2015-12-09 ENCOUNTER — Ambulatory Visit (INDEPENDENT_AMBULATORY_CARE_PROVIDER_SITE_OTHER): Payer: Medicare Other | Admitting: Pharmacist

## 2015-12-09 DIAGNOSIS — I6350 Cerebral infarction due to unspecified occlusion or stenosis of unspecified cerebral artery: Secondary | ICD-10-CM | POA: Diagnosis not present

## 2015-12-09 DIAGNOSIS — I059 Rheumatic mitral valve disease, unspecified: Secondary | ICD-10-CM | POA: Diagnosis not present

## 2015-12-09 DIAGNOSIS — Z7901 Long term (current) use of anticoagulants: Secondary | ICD-10-CM | POA: Diagnosis not present

## 2015-12-09 DIAGNOSIS — I4891 Unspecified atrial fibrillation: Secondary | ICD-10-CM

## 2015-12-09 DIAGNOSIS — Z9889 Other specified postprocedural states: Secondary | ICD-10-CM

## 2015-12-09 DIAGNOSIS — G459 Transient cerebral ischemic attack, unspecified: Secondary | ICD-10-CM

## 2015-12-09 LAB — POCT INR: INR: 5.7

## 2015-12-19 ENCOUNTER — Ambulatory Visit: Payer: Medicare Other | Admitting: Internal Medicine

## 2015-12-20 ENCOUNTER — Ambulatory Visit (INDEPENDENT_AMBULATORY_CARE_PROVIDER_SITE_OTHER): Payer: Medicare Other

## 2015-12-20 DIAGNOSIS — Z23 Encounter for immunization: Secondary | ICD-10-CM | POA: Diagnosis not present

## 2015-12-22 ENCOUNTER — Ambulatory Visit: Payer: Medicare Other

## 2015-12-23 ENCOUNTER — Ambulatory Visit (INDEPENDENT_AMBULATORY_CARE_PROVIDER_SITE_OTHER): Payer: Medicare Other | Admitting: *Deleted

## 2015-12-23 DIAGNOSIS — Z9889 Other specified postprocedural states: Secondary | ICD-10-CM

## 2015-12-23 DIAGNOSIS — I6350 Cerebral infarction due to unspecified occlusion or stenosis of unspecified cerebral artery: Secondary | ICD-10-CM | POA: Diagnosis not present

## 2015-12-23 DIAGNOSIS — I059 Rheumatic mitral valve disease, unspecified: Secondary | ICD-10-CM | POA: Diagnosis not present

## 2015-12-23 DIAGNOSIS — Z7901 Long term (current) use of anticoagulants: Secondary | ICD-10-CM

## 2015-12-23 LAB — POCT INR: INR: 3.4

## 2016-01-03 ENCOUNTER — Other Ambulatory Visit: Payer: Self-pay | Admitting: *Deleted

## 2016-01-03 MED ORDER — WARFARIN SODIUM 5 MG PO TABS: ORAL_TABLET | ORAL | 1 refills | Status: DC

## 2016-01-23 ENCOUNTER — Ambulatory Visit (INDEPENDENT_AMBULATORY_CARE_PROVIDER_SITE_OTHER): Payer: Medicare Other

## 2016-01-23 DIAGNOSIS — I059 Rheumatic mitral valve disease, unspecified: Secondary | ICD-10-CM | POA: Diagnosis not present

## 2016-01-23 DIAGNOSIS — I6350 Cerebral infarction due to unspecified occlusion or stenosis of unspecified cerebral artery: Secondary | ICD-10-CM | POA: Diagnosis not present

## 2016-01-23 DIAGNOSIS — Z9889 Other specified postprocedural states: Secondary | ICD-10-CM | POA: Diagnosis not present

## 2016-01-23 DIAGNOSIS — Z7901 Long term (current) use of anticoagulants: Secondary | ICD-10-CM | POA: Diagnosis not present

## 2016-01-23 LAB — POCT INR: INR: 4.2

## 2016-02-06 ENCOUNTER — Other Ambulatory Visit (INDEPENDENT_AMBULATORY_CARE_PROVIDER_SITE_OTHER): Payer: Medicare Other

## 2016-02-06 ENCOUNTER — Telehealth: Payer: Self-pay

## 2016-02-06 DIAGNOSIS — I480 Paroxysmal atrial fibrillation: Secondary | ICD-10-CM

## 2016-02-06 LAB — LIPID PANEL
CHOL/HDL RATIO: 3
Cholesterol: 132 mg/dL (ref 0–200)
HDL: 48.8 mg/dL (ref >39.00)
LDL Cholesterol: 68 mg/dL (ref 0–99)
NONHDL: 83.67
TRIGLYCERIDES: 79 mg/dL (ref 0.0–149.0)
VLDL: 15.8 mg/dL (ref 0.0–40.0)

## 2016-02-06 LAB — BASIC METABOLIC PANEL
BUN: 19 mg/dL (ref 6–23)
CHLORIDE: 105 meq/L (ref 96–112)
CO2: 30 mEq/L (ref 19–32)
Calcium: 9.4 mg/dL (ref 8.4–10.5)
Creatinine, Ser: 1.15 mg/dL (ref 0.40–1.50)
GFR: 64.94 mL/min (ref >60.00)
GLUCOSE: 115 mg/dL — AB (ref 70–99)
POTASSIUM: 4.3 meq/L (ref 3.5–5.1)
Sodium: 142 mEq/L (ref 135–145)

## 2016-02-06 LAB — HEMOGLOBIN A1C: Hgb A1c MFr Bld: 5.8 % (ref 4.6–6.5)

## 2016-02-06 LAB — HEPATIC FUNCTION PANEL
ALK PHOS: 105 U/L (ref 39–117)
ALT: 13 U/L (ref 0–53)
AST: 22 U/L (ref 0–37)
Albumin: 4.4 g/dL (ref 3.5–5.2)
BILIRUBIN DIRECT: 0.2 mg/dL (ref 0.0–0.3)
BILIRUBIN TOTAL: 0.7 mg/dL (ref 0.2–1.2)
Total Protein: 7.4 g/dL (ref 6.0–8.3)

## 2016-02-06 LAB — AMMONIA: Ammonia: 35 umol/L (ref 11–35)

## 2016-02-06 NOTE — Telephone Encounter (Signed)
error 

## 2016-02-10 ENCOUNTER — Ambulatory Visit (INDEPENDENT_AMBULATORY_CARE_PROVIDER_SITE_OTHER): Payer: Medicare Other | Admitting: *Deleted

## 2016-02-10 DIAGNOSIS — I059 Rheumatic mitral valve disease, unspecified: Secondary | ICD-10-CM | POA: Diagnosis not present

## 2016-02-10 DIAGNOSIS — I6350 Cerebral infarction due to unspecified occlusion or stenosis of unspecified cerebral artery: Secondary | ICD-10-CM

## 2016-02-10 DIAGNOSIS — Z9889 Other specified postprocedural states: Secondary | ICD-10-CM | POA: Diagnosis not present

## 2016-02-10 DIAGNOSIS — Z7901 Long term (current) use of anticoagulants: Secondary | ICD-10-CM | POA: Diagnosis not present

## 2016-02-10 LAB — POCT INR: INR: 3.7

## 2016-02-13 ENCOUNTER — Encounter: Payer: Self-pay | Admitting: Internal Medicine

## 2016-02-13 ENCOUNTER — Ambulatory Visit (INDEPENDENT_AMBULATORY_CARE_PROVIDER_SITE_OTHER): Payer: Medicare Other | Admitting: Internal Medicine

## 2016-02-13 DIAGNOSIS — I2510 Atherosclerotic heart disease of native coronary artery without angina pectoris: Secondary | ICD-10-CM | POA: Diagnosis not present

## 2016-02-13 DIAGNOSIS — G458 Other transient cerebral ischemic attacks and related syndromes: Secondary | ICD-10-CM

## 2016-02-13 DIAGNOSIS — K7460 Unspecified cirrhosis of liver: Secondary | ICD-10-CM | POA: Diagnosis not present

## 2016-02-13 DIAGNOSIS — I482 Chronic atrial fibrillation, unspecified: Secondary | ICD-10-CM

## 2016-02-13 DIAGNOSIS — I10 Essential (primary) hypertension: Secondary | ICD-10-CM | POA: Diagnosis not present

## 2016-02-13 DIAGNOSIS — E1159 Type 2 diabetes mellitus with other circulatory complications: Secondary | ICD-10-CM

## 2016-02-13 DIAGNOSIS — R112 Nausea with vomiting, unspecified: Secondary | ICD-10-CM | POA: Diagnosis not present

## 2016-02-13 DIAGNOSIS — Z91018 Allergy to other foods: Secondary | ICD-10-CM

## 2016-02-13 MED ORDER — PANTOPRAZOLE SODIUM 40 MG PO TBEC: 40.0000 mg | DELAYED_RELEASE_TABLET | Freq: Every day | ORAL | 3 refills | Status: DC

## 2016-02-13 MED ORDER — ATORVASTATIN CALCIUM 20 MG PO TABS: 20.0000 mg | ORAL_TABLET | ORAL | 11 refills | Status: DC

## 2016-02-13 MED ORDER — ALLOPURINOL 300 MG PO TABS: 300.0000 mg | ORAL_TABLET | Freq: Every day | ORAL | 1 refills | Status: DC | PRN

## 2016-02-13 NOTE — Assessment & Plan Note (Signed)
Labs OK

## 2016-02-13 NOTE — Progress Notes (Signed)
Pre visit review using our clinic review tool, if applicable. No additional management support is needed unless otherwise documented below in the visit note. 

## 2016-02-13 NOTE — Patient Instructions (Signed)
  Food diary  Benadryl as needed

## 2016-02-13 NOTE — Assessment & Plan Note (Signed)
No relapse 

## 2016-02-13 NOTE — Assessment & Plan Note (Signed)
Rate controlled 

## 2016-02-13 NOTE — Progress Notes (Signed)
Subjective:  Patient ID: Anthony Gould, male    DOB: [birth year]  Age: 82 y.o. MRN: [id number]  CC: No chief complaint on file.   HPI Anthony Gould presents for HTN, liver disease, anticoagulation f/u. He had 2 more episodes of n/v in Sept and Nov 2017 . No ETOH  Outpatient Medications Prior to Visit  Medication Sig Dispense Refill  . allopurinol (ZYLOPRIM) 300 MG tablet Take 1 tablet (300 mg total) by mouth daily as needed. 90 tablet 1  . aspirin 81 MG EC tablet Take 81 mg by mouth daily.      . bisoprolol (ZEBETA) 5 MG tablet TAKE 1/2 BY MOUTH DAILY 45 tablet 3  . diltiazem (CARDIZEM CD) 360 MG 24 hr capsule TAKE 1 TABLET BY MOUTH DAILY 90 capsule 2  . dipyridamole (PERSANTINE) 50 MG tablet Take 1 tablet (50 mg total) by mouth 3 (three) times daily. 270 tablet 2  . enalapril (VASOTEC) 20 MG tablet Take 1 tablet (20 mg total) by mouth 2 (two) times daily. 180 tablet 3  . furosemide (LASIX) 40 MG tablet Take 1 tablet by mouth in the AM and 1/2 tablet by mouth in the PM 135 tablet 1  . latanoprost (XALATAN) 0.005 % ophthalmic solution Place 1 drop into both eyes at bedtime.    . Milk Thistle 1000 MG CAPS 1 po qd 100 capsule 3  . pantoprazole (PROTONIX) 40 MG tablet Take 1 tablet (40 mg total) by mouth 2 (two) times daily. 60 tablet 2  . promethazine (PHENERGAN) 25 MG tablet Take 1 tablet (25 mg total) by mouth every 8 (eight) hours as needed for nausea or vomiting. 20 tablet 1  . warfarin (COUMADIN) 5 MG tablet Take as directed by the Anticoagulation Clinic 135 tablet 1   No facility-administered medications prior to visit.     ROS Review of Systems  Constitutional: Negative for appetite change, fatigue and unexpected weight change.  HENT: Negative for congestion, nosebleeds, sneezing, sore throat and trouble swallowing.   Eyes: Negative for itching and visual disturbance.  Respiratory: Negative for cough.   Cardiovascular: Negative for chest pain, palpitations and leg  swelling.  Gastrointestinal: Positive for abdominal distention. Negative for blood in stool, diarrhea and nausea.  Genitourinary: Negative for frequency and hematuria.  Musculoskeletal: Negative for back pain, gait problem, joint swelling and neck pain.  Skin: Negative for rash.  Neurological: Negative for dizziness, tremors, speech difficulty and weakness.  Psychiatric/Behavioral: Negative for agitation, dysphoric mood and sleep disturbance. The patient is not nervous/anxious.     Objective:  BP (!) 154/78   Pulse 64   Wt 180 lb (81.6 kg)   SpO2 97%   BMI 25.83 kg/m   BP Readings from Last 3 Encounters:  02/13/16 (!) 154/78  11/14/15 (!) 168/64  10/12/15 130/70    Wt Readings from Last 3 Encounters:  02/13/16 180 lb (81.6 kg)  11/14/15 178 lb (80.7 kg)  10/12/15 180 lb (81.6 kg)    Physical Exam  Constitutional: He is oriented to person, place, and time. He appears well-developed. No distress.  NAD  HENT:  Mouth/Throat: Oropharynx is clear and moist.  Eyes: Conjunctivae are normal. Pupils are equal, round, and reactive to light.  Neck: Normal range of motion. No JVD present. No thyromegaly present.  Cardiovascular: Normal rate, regular rhythm, normal heart sounds and intact distal pulses.  Exam reveals no gallop and no friction rub.   No murmur heard. Pulmonary/Chest: Effort normal and breath sounds normal.  No respiratory distress. He has no wheezes. He has no rales. He exhibits no tenderness.  Abdominal: Soft. Bowel sounds are normal. He exhibits no distension and no mass. There is no tenderness. There is no rebound and no guarding.  Musculoskeletal: Normal range of motion. He exhibits no edema or tenderness.  Lymphadenopathy:    He has no cervical adenopathy.  Neurological: He is alert and oriented to person, place, and time. He has normal reflexes. No cranial nerve deficit. He exhibits normal muscle tone. He displays a negative Romberg sign. Coordination and gait  normal.  Skin: Skin is warm and dry. No rash noted.  Psychiatric: He has a normal mood and affect. His behavior is normal. Judgment and thought content normal.    Lab Results  Component Value Date   WBC 9.0 09/20/2015   HGB 14.8 09/20/2015   HCT 44.5 09/20/2015   PLT 292.0 09/20/2015   GLUCOSE 115 (H) 02/06/2016   CHOL 132 02/06/2016   TRIG 79.0 02/06/2016   HDL 48.80 02/06/2016   LDLCALC 68 02/06/2016   ALT 13 02/06/2016   AST 22 02/06/2016   NA 142 02/06/2016   K 4.3 02/06/2016   CL 105 02/06/2016   CREATININE 1.15 02/06/2016   BUN 19 02/06/2016   CO2 30 02/06/2016   TSH 6.61 (H) 09/20/2015   PSA 0.87 10/20/2014   INR 3.7 02/10/2016   HGBA1C 5.8 02/06/2016    Dg Chest 2 View  Result Date: 11/14/2015 CLINICAL DATA:  Followup pneumonia. History of mitral valve replacement, hypertension, diabetes EXAM: CHEST  2 VIEW COMPARISON:  09/02/2015 FINDINGS: Previously seen right upper lobe pneumonia has resolved. No confluent opacities. No effusions. Heart is normal size. Prior valve replacement. No acute bony abnormality. IMPRESSION: Interval resolution of the previously seen right upper lobe pneumonia. Cardiomegaly. No acute findings. Electronically Signed   By: Rolm Baptise M.D.   On: 11/14/2015 10:19    Assessment & Plan:   There are no diagnoses linked to this encounter. I am having Anthony Gould maintain his aspirin, latanoprost, enalapril, bisoprolol, diltiazem, dipyridamole, allopurinol, promethazine, furosemide, pantoprazole, Milk Thistle, warfarin, and atorvastatin.  Meds ordered this encounter  Medications  . atorvastatin (LIPITOR) 20 MG tablet    Sig: Take 20 mg by mouth 2 (two) times a week.     Follow-up: No Follow-up on file.  Walker Kehr, MD

## 2016-02-13 NOTE — Assessment & Plan Note (Addendum)
Diltiazem, Bisoprolol, Enalapril,Furosemide Nl BP at home

## 2016-02-13 NOTE — Assessment & Plan Note (Signed)
The pt 2 more episodes of n/v in Sept and Nov 2017  No ETOH

## 2016-02-13 NOTE — Assessment & Plan Note (Signed)
ASA, Diltiazem, Lipitor, Bisoprolol

## 2016-02-13 NOTE — Assessment & Plan Note (Addendum)
2 more episodes of n/v in Sept and Nov 2017   Food diary   Benadryl prn

## 2016-02-13 NOTE — Assessment & Plan Note (Signed)
Seafood - n/v Food diary  Benadryl prn

## 2016-03-01 ENCOUNTER — Ambulatory Visit (INDEPENDENT_AMBULATORY_CARE_PROVIDER_SITE_OTHER): Payer: Medicare Other | Admitting: *Deleted

## 2016-03-01 DIAGNOSIS — Z7901 Long term (current) use of anticoagulants: Secondary | ICD-10-CM

## 2016-03-01 DIAGNOSIS — I6350 Cerebral infarction due to unspecified occlusion or stenosis of unspecified cerebral artery: Secondary | ICD-10-CM

## 2016-03-01 DIAGNOSIS — Z9889 Other specified postprocedural states: Secondary | ICD-10-CM | POA: Diagnosis not present

## 2016-03-01 DIAGNOSIS — I059 Rheumatic mitral valve disease, unspecified: Secondary | ICD-10-CM

## 2016-03-01 DIAGNOSIS — G458 Other transient cerebral ischemic attacks and related syndromes: Secondary | ICD-10-CM

## 2016-03-01 DIAGNOSIS — I482 Chronic atrial fibrillation, unspecified: Secondary | ICD-10-CM

## 2016-03-01 LAB — POCT INR: INR: 3.5

## 2016-03-09 ENCOUNTER — Encounter: Payer: Self-pay | Admitting: Cardiology

## 2016-03-09 ENCOUNTER — Ambulatory Visit (INDEPENDENT_AMBULATORY_CARE_PROVIDER_SITE_OTHER): Payer: Medicare Other | Admitting: Cardiology

## 2016-03-09 VITALS — BP 160/68 | HR 73 | Ht 70.0 in | Wt 174.0 lb

## 2016-03-09 DIAGNOSIS — I059 Rheumatic mitral valve disease, unspecified: Secondary | ICD-10-CM

## 2016-03-09 DIAGNOSIS — I10 Essential (primary) hypertension: Secondary | ICD-10-CM

## 2016-03-09 DIAGNOSIS — I2510 Atherosclerotic heart disease of native coronary artery without angina pectoris: Secondary | ICD-10-CM | POA: Diagnosis not present

## 2016-03-09 DIAGNOSIS — I482 Chronic atrial fibrillation, unspecified: Secondary | ICD-10-CM

## 2016-03-09 MED ORDER — DILTIAZEM HCL ER COATED BEADS 360 MG PO CP24: ORAL_CAPSULE | ORAL | 3 refills | Status: DC

## 2016-03-09 MED ORDER — DIPYRIDAMOLE 50 MG PO TABS: 50.0000 mg | ORAL_TABLET | Freq: Three times a day (TID) | ORAL | 3 refills | Status: DC

## 2016-03-09 NOTE — Patient Instructions (Signed)
Medication Instructions:  Your physician recommends that you continue on your current medications as directed. Please refer to the Current Medication list given to you today.   Labwork: None   Testing/Procedures: None   Follow-Up: Your physician wants you to follow-up in: 6 months with Dr End. (June 2018). You will receive a reminder letter in the mail two months in advance. If you don't receive a letter, please call our office to schedule the follow-up appointment.        If you need a refill on your cardiac medications before your next appointment, please call your pharmacy.

## 2016-03-11 NOTE — Progress Notes (Signed)
Patient ID: Anthony Gould, male   DOB: 04-14-1933, 82 y.o.   MRN: [id number] PCP: Dr. Alain Marion  82 yo with history of mechanical mitral valve replacement for severe mitral regurgitation and permanent atrial fibrillation presents for cardiology followup.  Patient has a history of possible TIAs for which he has has been on aspirin and dipyridamole long-term.  Patient had an ETT-myoview in 12/11 with no ischemia or infarction.  This fall, he reported episodes of tachypalpitations.  He ended up being set up for ETT-Cardiolite in 11/14.  Nuclear images showed no evidence for ischemia or infarction, but he had multiple runs of NSVT while on the treadmill.  He did not have lightheadedness with these episodes.  He was started on bisoprolol 2.5 mg daily.  Echo in 11/14 showed EF 50% with mild diffuse hypokinesis, moderately dilated RV with normal function, and normal mechanical mitral valve.  Holter in 11/14 showed occasional PVCs and up to 3 beats NSVT but no long runs.  Since last appointment, at CT abdomen was suggestive of cirrhosis, NASH versus ?ETOH.  He was a moderate ETOH drinker, now has stopped altogether.   No palpitations.  He does not get exertional chest pain.  Weight is down 15 lbs.  No exertional dyspnea.  Plays golf every week.  No orthopnea or PND.  No melena/BRBPR.  BP high in the office today, but at home, SBP runs 120s-130s (checks regularly).   ECG: atrial fibrillation, PVCs  Labs (4/12): LDL 70, HDL 59, K 4.9, creatinine 1.0, TSH 6.5 (elevated), LFTs normal Labs (6/13): K 4.5, creatinine 1.0, LDL 60 HDL 51 Labs (6/14): LDL 43, HDL 51 Labs (11/14): K 4.1, creatinine 1.1, TSH normal, HCT 44 Labs (5/15): K 4.4, creatinine 1.1, HCT 44.3, LDL 63, HDL 72 Labs (3/16): LDL 65, HDl 67, K 5.2 => 4.9, creatinine 1.31 => 1.73 => 1.39 Labs (7/16): LDL 62, HDL 70 Labs (11/17): LDL 68, HDL 49, LDTs normal, K 4.3, creatinine 1.15.   PMH: 1. Atrial fibrillation: Permanent, on coumadin.  He has not  been able to tolerate metoprolol due to severe fatigue.  2. DM2: diet-controlled 3. HTN 4. Hyperlipidemia 5. 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  Patient was started on dipyridamole years ago for this.   6. 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  Echo (6/13) with EF 50-55%, mild LVH, mildly dilated LV, mechanical mitral valve functioning normally, biatrial enlargement, PA systolic pressure 38 mmHg.  Echo (11/14) with EF 50%, mild diffuse hypokinesis, normal mechanical mitral valve, moderately dilated RV with normal systolic function. Echo (3/16) with mild LVH, EF 50-55%, mechanical mitral valve with mean gradient 5 mmHg, moderately dilated RV with normal systolic function, severe LAE, moderate TR, PASP 46 mmHg.  7. ETT-myoview (12/11): EF 46%, no evidence for ischemia or infarction, there were runs of NSVT reported.  ETT-Cardiolite (11/14): not gated, 5:02 exercise, no ischemia or infarction by perfusion images but had multiple runs of NSVT.  8. Diastolic CHF.  9. Gout 10. NSVT: ETT-Cardiolite in 11/14 with runs of NSVT.  Holter monitor (11/14) showed predominant atrial fibrillation with some PVCs, he had a few run of 3 beats NSVT.  11. Cirrhosis: Noted by CT. ?NASH versus ETOH.   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  Former Therapist, sports.  Lives in [place].   FH: Father with MI at 71.  Mother with CVA.   ROS: All systems reviewed and negative except as per HPI.    Current Outpatient  Prescriptions  Medication Sig Dispense Refill  . allopurinol (ZYLOPRIM) 300 MG tablet Take 1 tablet (300 mg total) by mouth daily as needed. 90 tablet 1  . aspirin 81 MG EC tablet Take 81 mg by mouth daily.      [name] atorvastatin (LIPITOR) 20 MG tablet Take 1 tablet (20 mg total) by mouth 2 (two) times a week. 30 tablet 11  . bisoprolol (ZEBETA) 5 MG tablet TAKE 1/2 BY MOUTH DAILY 45 tablet 3  . diltiazem (CARDIZEM CD) 360 MG 24 hr capsule TAKE 1 TABLET  BY MOUTH DAILY 90 capsule 3  . dipyridamole (PERSANTINE) 50 MG tablet Take 1 tablet (50 mg total) by mouth 3 (three) times daily. 270 tablet 3  . enalapril (VASOTEC) 20 MG tablet Take 1 tablet (20 mg total) by mouth 2 (two) times daily. 180 tablet 3  . furosemide (LASIX) 40 MG tablet Take 1 tablet by mouth in the AM and 1/2 tablet by mouth in the PM 135 tablet 1  . latanoprost (XALATAN) 0.005 % ophthalmic solution Place 1 drop into both eyes at bedtime.    . Milk Thistle 1000 MG CAPS 1 po qd 100 capsule 3  . pantoprazole (PROTONIX) 40 MG tablet Take 1 tablet (40 mg total) by mouth daily. 90 tablet 3  . promethazine (PHENERGAN) 25 MG tablet Take 1 tablet (25 mg total) by mouth every 8 (eight) hours as needed for nausea or vomiting. 20 tablet 1  . warfarin (COUMADIN) 5 MG tablet Take as directed by the Anticoagulation Clinic 135 tablet 1   No current facility-administered medications for this visit.     BP (!) 160/68   Pulse 73   Ht 5\' 10"  (1.778 m)   Wt 174 lb (78.9 kg)   SpO2 95%   BMI 24.97 kg/m  General: NAD Neck: JVP 8 cm, no thyromegaly or thyroid nodule.  Lungs: Clear to auscultation bilaterally with normal respiratory effort. CV: Nondisplaced PMI.  Heart irregular S1/S2, mechanical S1, no [id number], 1/6 systolic murmur along the sternal border.  Trace ankle edema.  No carotid bruit.  Normal pedal pulses.  Abdomen: Soft, nontender, no hepatosplenomegaly, no distention.  Neurologic: Alert and oriented x 3.  Psych: Normal affect. Extremities: No clubbing or cyanosis.   Assessment/Plan:  1. Atrial fibrillation:  Stable on coumadin.  Good rate control.   2. Hypertension:   BP controlled at home, checks regularly.  Continue current diltiazem CD, bisoprolol, and enalapril.   3. Medtronic-Hall mechanical mitral valve:  Continue coumadin and ASA 81 mg daily.  Last echo in 11/14 showed well-seated valve.  Goal INR 3-3.5 given TIAs while on coumadin with lower INR goal.   4. TIA:  History of  TIAs while on coumadin.  Has been on dipyridamole long-term with no recurrent TIA symptoms.    5. Hyperlipidemia: Good lipids 11/17.      6. Chronic diastolic CHF: NYHA class II, not significantly volume overloaded. - Continue current Lasix.  Recent BMET ok.  7. NSVT: Multiple runs while doing ETT in 11/14.  Looking back, it appears that he also had NSVT on his ETT-Myoview in 2011.  Perfusion images were normal on both studies.  He did not have chest pain or lightheadedness.  I have started him on bisoprolol, which he has tolerated (had trouble with metoprolol in the past).  Echo at that time showed preserved EF and holter showed occasional PVCs with a couple runs of 3 beats NSVT.   8. Cirrhosis: Etiology uncertain.  Noted on CT.  Could be NASH.  He used to drink moderate ETOH but has stopped totally.   Given my transition to CHF clinic, he will followup in 6 months with Dr. Saunders Revel.   Loralie Champagne 03/11/2016

## 2016-03-30 ENCOUNTER — Ambulatory Visit (INDEPENDENT_AMBULATORY_CARE_PROVIDER_SITE_OTHER): Payer: Medicare Other | Admitting: Pharmacist

## 2016-03-30 DIAGNOSIS — G458 Other transient cerebral ischemic attacks and related syndromes: Secondary | ICD-10-CM

## 2016-03-30 DIAGNOSIS — I6350 Cerebral infarction due to unspecified occlusion or stenosis of unspecified cerebral artery: Secondary | ICD-10-CM | POA: Diagnosis not present

## 2016-03-30 DIAGNOSIS — I059 Rheumatic mitral valve disease, unspecified: Secondary | ICD-10-CM | POA: Diagnosis not present

## 2016-03-30 DIAGNOSIS — Z7901 Long term (current) use of anticoagulants: Secondary | ICD-10-CM

## 2016-03-30 DIAGNOSIS — Z9889 Other specified postprocedural states: Secondary | ICD-10-CM

## 2016-03-30 DIAGNOSIS — I482 Chronic atrial fibrillation, unspecified: Secondary | ICD-10-CM

## 2016-03-30 LAB — POCT INR: INR: 3.2

## 2016-04-17 DIAGNOSIS — H40053 Ocular hypertension, bilateral: Secondary | ICD-10-CM | POA: Diagnosis not present

## 2016-04-17 DIAGNOSIS — H2513 Age-related nuclear cataract, bilateral: Secondary | ICD-10-CM | POA: Diagnosis not present

## 2016-04-25 ENCOUNTER — Other Ambulatory Visit: Payer: Self-pay | Admitting: *Deleted

## 2016-04-25 DIAGNOSIS — I5032 Chronic diastolic (congestive) heart failure: Secondary | ICD-10-CM

## 2016-04-25 MED ORDER — FUROSEMIDE 40 MG PO TABS: ORAL_TABLET | ORAL | 2 refills | Status: DC

## 2016-04-26 ENCOUNTER — Telehealth: Payer: Self-pay

## 2016-04-26 MED ORDER — ENALAPRIL MALEATE 20 MG PO TABS: 20.0000 mg | ORAL_TABLET | Freq: Two times a day (BID) | ORAL | 1 refills | Status: DC

## 2016-04-26 NOTE — Telephone Encounter (Signed)
[facility] mail service sent rx rq to rf the enalapril 20 mg tab.   Erx sent for 90d and 1 refill.   Forwarding to PCP as FYI.

## 2016-04-27 ENCOUNTER — Ambulatory Visit (INDEPENDENT_AMBULATORY_CARE_PROVIDER_SITE_OTHER): Payer: Medicare Other

## 2016-04-27 DIAGNOSIS — Z7901 Long term (current) use of anticoagulants: Secondary | ICD-10-CM | POA: Diagnosis not present

## 2016-04-27 DIAGNOSIS — I482 Chronic atrial fibrillation, unspecified: Secondary | ICD-10-CM

## 2016-04-27 DIAGNOSIS — I6350 Cerebral infarction due to unspecified occlusion or stenosis of unspecified cerebral artery: Secondary | ICD-10-CM | POA: Diagnosis not present

## 2016-04-27 DIAGNOSIS — Z9889 Other specified postprocedural states: Secondary | ICD-10-CM | POA: Diagnosis not present

## 2016-04-27 DIAGNOSIS — I059 Rheumatic mitral valve disease, unspecified: Secondary | ICD-10-CM

## 2016-04-27 DIAGNOSIS — G458 Other transient cerebral ischemic attacks and related syndromes: Secondary | ICD-10-CM

## 2016-04-27 LAB — POCT INR: INR: 3

## 2016-05-10 ENCOUNTER — Telehealth: Payer: Self-pay | Admitting: Emergency Medicine

## 2016-05-10 NOTE — Telephone Encounter (Signed)
Routing to dr plotnikov---would you like patient to put mask on and keep appt or just do labs?---please advise, I will call patient back, thanks

## 2016-05-10 NOTE — Telephone Encounter (Signed)
Pt called and stated he has an appt on tues 2/20. He is scared to come in the office with all the flu. He wants to know if he should go down to lab and have his blood work drawn but cancel his appt and reschedule for a later date so he doesn't take a chance of getting sick. If so can he have the blood work done and have someone call him with the results. Please advise thanks.

## 2016-05-11 ENCOUNTER — Other Ambulatory Visit (INDEPENDENT_AMBULATORY_CARE_PROVIDER_SITE_OTHER): Payer: Medicare Other

## 2016-05-11 DIAGNOSIS — E1159 Type 2 diabetes mellitus with other circulatory complications: Secondary | ICD-10-CM

## 2016-05-11 DIAGNOSIS — R112 Nausea with vomiting, unspecified: Secondary | ICD-10-CM

## 2016-05-11 DIAGNOSIS — K7460 Unspecified cirrhosis of liver: Secondary | ICD-10-CM

## 2016-05-11 DIAGNOSIS — I10 Essential (primary) hypertension: Secondary | ICD-10-CM | POA: Diagnosis not present

## 2016-05-11 LAB — HEPATIC FUNCTION PANEL
ALBUMIN: 4.5 g/dL (ref 3.5–5.2)
ALT: 15 U/L (ref 0–53)
AST: 23 U/L (ref 0–37)
Alkaline Phosphatase: 100 U/L (ref 39–117)
Bilirubin, Direct: 0.2 mg/dL (ref 0.0–0.3)
TOTAL PROTEIN: 7.6 g/dL (ref 6.0–8.3)
Total Bilirubin: 0.7 mg/dL (ref 0.2–1.2)

## 2016-05-11 LAB — BASIC METABOLIC PANEL
BUN: 34 mg/dL — ABNORMAL HIGH (ref 6–23)
CHLORIDE: 104 meq/L (ref 96–112)
CO2: 25 mEq/L (ref 19–32)
Calcium: 9.6 mg/dL (ref 8.4–10.5)
Creatinine, Ser: 1.37 mg/dL (ref 0.40–1.50)
GFR: 53.02 mL/min — AB (ref >60.00)
GLUCOSE: 140 mg/dL — AB (ref 70–99)
POTASSIUM: 4.3 meq/L (ref 3.5–5.1)
SODIUM: 138 meq/L (ref 135–145)

## 2016-05-11 LAB — CBC WITH DIFFERENTIAL/PLATELET
Basophils Absolute: 0.1 10*3/uL (ref 0.0–0.1)
Basophils Relative: 0.8 % (ref 0.0–3.0)
EOS PCT: 5.5 % — AB (ref 0.0–5.0)
Eosinophils Absolute: 0.5 10*3/uL (ref 0.0–0.7)
HCT: 40.2 % (ref 39.0–52.0)
HEMOGLOBIN: 13.5 g/dL (ref 13.0–17.0)
Lymphocytes Relative: 17 % (ref 12.0–46.0)
Lymphs Abs: 1.4 10*3/uL (ref 0.7–4.0)
MCHC: 33.5 g/dL (ref 30.0–36.0)
MCV: 98.3 fl (ref 78.0–100.0)
Monocytes Absolute: 0.7 10*3/uL (ref 0.1–1.0)
Monocytes Relative: 8.5 % (ref 3.0–12.0)
Neutro Abs: 5.8 10*3/uL (ref 1.4–7.7)
Neutrophils Relative %: 68.2 % (ref 43.0–77.0)
Platelets: 238 10*3/uL (ref 150.0–400.0)
RBC: 4.09 Mil/uL — AB (ref 4.22–5.81)
RDW: 15.2 % (ref 11.5–15.5)
WBC: 8.5 10*3/uL (ref 4.0–10.5)

## 2016-05-11 LAB — HEMOGLOBIN A1C: HEMOGLOBIN A1C: 6 % (ref 4.6–6.5)

## 2016-05-13 NOTE — Telephone Encounter (Addendum)
Labs are OK Resch appt in 6 weeks pls Thx

## 2016-05-14 NOTE — Telephone Encounter (Signed)
Labs were already done on last Friday 2/16 and patient has scheduled appt for 06/18/16

## 2016-05-15 ENCOUNTER — Ambulatory Visit: Payer: Medicare Other | Admitting: Internal Medicine

## 2016-05-21 DIAGNOSIS — H40053 Ocular hypertension, bilateral: Secondary | ICD-10-CM | POA: Diagnosis not present

## 2016-05-21 DIAGNOSIS — H2513 Age-related nuclear cataract, bilateral: Secondary | ICD-10-CM | POA: Diagnosis not present

## 2016-05-21 DIAGNOSIS — H3589 Other specified retinal disorders: Secondary | ICD-10-CM | POA: Diagnosis not present

## 2016-05-23 ENCOUNTER — Ambulatory Visit: Payer: Medicare Other | Admitting: Internal Medicine

## 2016-05-30 ENCOUNTER — Ambulatory Visit (INDEPENDENT_AMBULATORY_CARE_PROVIDER_SITE_OTHER): Payer: Medicare Other | Admitting: *Deleted

## 2016-05-30 DIAGNOSIS — I482 Chronic atrial fibrillation, unspecified: Secondary | ICD-10-CM

## 2016-05-30 DIAGNOSIS — I059 Rheumatic mitral valve disease, unspecified: Secondary | ICD-10-CM | POA: Diagnosis not present

## 2016-05-30 DIAGNOSIS — Z9889 Other specified postprocedural states: Secondary | ICD-10-CM

## 2016-05-30 DIAGNOSIS — G458 Other transient cerebral ischemic attacks and related syndromes: Secondary | ICD-10-CM | POA: Diagnosis not present

## 2016-05-30 DIAGNOSIS — Z7901 Long term (current) use of anticoagulants: Secondary | ICD-10-CM | POA: Diagnosis not present

## 2016-05-30 DIAGNOSIS — I6350 Cerebral infarction due to unspecified occlusion or stenosis of unspecified cerebral artery: Secondary | ICD-10-CM | POA: Diagnosis not present

## 2016-05-30 LAB — POCT INR: INR: 3.3

## 2016-06-18 ENCOUNTER — Ambulatory Visit (INDEPENDENT_AMBULATORY_CARE_PROVIDER_SITE_OTHER): Payer: Medicare Other | Admitting: Internal Medicine

## 2016-06-18 ENCOUNTER — Encounter: Payer: Self-pay | Admitting: Internal Medicine

## 2016-06-18 DIAGNOSIS — I481 Persistent atrial fibrillation: Secondary | ICD-10-CM | POA: Diagnosis not present

## 2016-06-18 DIAGNOSIS — T6291XA Toxic effect of unspecified noxious substance eaten as food, accidental (unintentional), initial encounter: Secondary | ICD-10-CM

## 2016-06-18 DIAGNOSIS — I10 Essential (primary) hypertension: Secondary | ICD-10-CM | POA: Diagnosis not present

## 2016-06-18 DIAGNOSIS — I4819 Other persistent atrial fibrillation: Secondary | ICD-10-CM

## 2016-06-18 DIAGNOSIS — I480 Paroxysmal atrial fibrillation: Secondary | ICD-10-CM | POA: Diagnosis not present

## 2016-06-18 DIAGNOSIS — IMO0001 Reserved for inherently not codable concepts without codable children: Secondary | ICD-10-CM

## 2016-06-18 NOTE — Assessment & Plan Note (Signed)
Coumadin Bisoprolol, Diltiazem

## 2016-06-18 NOTE — Progress Notes (Signed)
Pre-visit discussion using our clinic review tool. No additional management support is needed unless otherwise documented below in the visit note.  

## 2016-06-18 NOTE — Assessment & Plan Note (Signed)
Cont w/Bisoprolol, Diltiazem, ASA, Coumadin

## 2016-06-18 NOTE — Progress Notes (Signed)
Subjective:  Patient ID: Anthony Gould, male    DOB: 1934/02/10  Age: 83 y.o. MRN: [id number]  CC: Hypertension and Diabetes   HPI Anthony Gould presents for abd pain and fever 100.37F x 1 day (wife was sick too x 1 d prior). Sx's resolved in 24 h  F/u dyslipidemia, HTN, GERD f/u  Outpatient Medications Prior to Visit  Medication Sig Dispense Refill  . allopurinol (ZYLOPRIM) 300 MG tablet Take 1 tablet (300 mg total) by mouth daily as needed. 90 tablet 1  . aspirin 81 MG EC tablet Take 81 mg by mouth daily.      [name] atorvastatin (LIPITOR) 20 MG tablet Take 1 tablet (20 mg total) by mouth 2 (two) times a week. 30 tablet 11  . bisoprolol (ZEBETA) 5 MG tablet TAKE 1/2 BY MOUTH DAILY 45 tablet 3  . diltiazem (CARDIZEM CD) 360 MG 24 hr capsule TAKE 1 TABLET BY MOUTH DAILY 90 capsule 3  . dipyridamole (PERSANTINE) 50 MG tablet Take 1 tablet (50 mg total) by mouth 3 (three) times daily. 270 tablet 3  . enalapril (VASOTEC) 20 MG tablet Take 1 tablet (20 mg total) by mouth 2 (two) times daily. 180 tablet 1  . furosemide (LASIX) 40 MG tablet Take 1 tablet by mouth in the AM and 1/2 tablet by mouth in the PM 135 tablet 2  . latanoprost (XALATAN) 0.005 % ophthalmic solution Place 1 drop into both eyes at bedtime.    . Milk Thistle 1000 MG CAPS 1 po qd 100 capsule 3  . pantoprazole (PROTONIX) 40 MG tablet Take 1 tablet (40 mg total) by mouth daily. 90 tablet 3  . promethazine (PHENERGAN) 25 MG tablet Take 1 tablet (25 mg total) by mouth every 8 (eight) hours as needed for nausea or vomiting. 20 tablet 1  . warfarin (COUMADIN) 5 MG tablet Take as directed by the Anticoagulation Clinic 135 tablet 1   No facility-administered medications prior to visit.     ROS Review of Systems  Constitutional: Negative for appetite change, fatigue and unexpected weight change.  HENT: Negative for congestion, nosebleeds, sneezing, sore throat and trouble swallowing.   Eyes: Negative for itching and visual  disturbance.  Respiratory: Negative for cough.   Cardiovascular: Negative for chest pain, palpitations and leg swelling.  Gastrointestinal: Negative for abdominal distention, blood in stool, diarrhea and nausea.  Genitourinary: Negative for frequency and hematuria.  Musculoskeletal: Negative for back pain, gait problem, joint swelling and neck pain.  Skin: Negative for rash.  Neurological: Negative for dizziness, tremors, speech difficulty and weakness.  Psychiatric/Behavioral: Negative for agitation, dysphoric mood, sleep disturbance and suicidal ideas. The patient is not nervous/anxious.     Objective:  BP 126/60   Pulse (!) 54   Temp 97.9 F (36.6 C) (Oral)   Resp 16   Ht 5\' 10"  (1.778 m)   Wt 178 lb (80.7 kg)   SpO2 98%   BMI 25.54 kg/m   BP Readings from Last 3 Encounters:  06/18/16 126/60  03/09/16 (!) 160/68  02/13/16 (!) 154/78    Wt Readings from Last 3 Encounters:  06/18/16 178 lb (80.7 kg)  03/09/16 174 lb (78.9 kg)  02/13/16 180 lb (81.6 kg)    Physical Exam  Constitutional: He is oriented to person, place, and time. He appears well-developed. No distress.  NAD  HENT:  Mouth/Throat: Oropharynx is clear and moist.  Eyes: Conjunctivae are normal. Pupils are equal, round, and reactive to light.  Neck:  Normal range of motion. No JVD present. No thyromegaly present.  Cardiovascular: Normal rate, regular rhythm, normal heart sounds and intact distal pulses.  Exam reveals no gallop and no friction rub.   No murmur heard. Pulmonary/Chest: Effort normal and breath sounds normal. No respiratory distress. He has no wheezes. He has no rales. He exhibits no tenderness.  Abdominal: Soft. Bowel sounds are normal. He exhibits no distension and no mass. There is no tenderness. There is no rebound and no guarding.  Musculoskeletal: Normal range of motion. He exhibits no edema or tenderness.  Lymphadenopathy:    He has no cervical adenopathy.  Neurological: He is alert and  oriented to person, place, and time. He has normal reflexes. No cranial nerve deficit. He exhibits normal muscle tone. He displays a negative Romberg sign. Coordination and gait normal.  Skin: Skin is warm and dry. No rash noted.  Psychiatric: He has a normal mood and affect. His behavior is normal. Judgment and thought content normal.    Lab Results  Component Value Date   WBC 8.5 05/11/2016   HGB 13.5 05/11/2016   HCT 40.2 05/11/2016   PLT 238.0 05/11/2016   GLUCOSE 140 (H) 05/11/2016   CHOL 132 02/06/2016   TRIG 79.0 02/06/2016   HDL 48.80 02/06/2016   LDLCALC 68 02/06/2016   ALT 15 05/11/2016   AST 23 05/11/2016   NA 138 05/11/2016   K 4.3 05/11/2016   CL 104 05/11/2016   CREATININE 1.37 05/11/2016   BUN 34 (H) 05/11/2016   CO2 25 05/11/2016   TSH 6.61 (H) 09/20/2015   PSA 0.87 10/20/2014   INR 3.3 05/30/2016   HGBA1C 6.0 05/11/2016    Dg Chest 2 View  Result Date: 11/14/2015 CLINICAL DATA:  Followup pneumonia. History of mitral valve replacement, hypertension, diabetes EXAM: CHEST  2 VIEW COMPARISON:  09/02/2015 FINDINGS: Previously seen right upper lobe pneumonia has resolved. No confluent opacities. No effusions. Heart is normal size. Prior valve replacement. No acute bony abnormality. IMPRESSION: Interval resolution of the previously seen right upper lobe pneumonia. Cardiomegaly. No acute findings. Electronically Signed   By: Rolm Baptise M.D.   On: 11/14/2015 10:19    Assessment & Plan:   There are no diagnoses linked to this encounter. I am having [name] maintain his aspirin, latanoprost, bisoprolol, promethazine, Milk Thistle, warfarin, allopurinol, atorvastatin, pantoprazole, dipyridamole, diltiazem, furosemide, and enalapril.  No orders of the defined types were placed in this encounter.    Follow-up: No Follow-up on file.  Walker Kehr, MD

## 2016-06-18 NOTE — Assessment & Plan Note (Signed)
Cont w/ Diltiazem, Bisoprolol, Enalapril, Furosemide

## 2016-06-18 NOTE — Assessment & Plan Note (Signed)
abd pain x 1 day - resolved

## 2016-07-09 ENCOUNTER — Ambulatory Visit (INDEPENDENT_AMBULATORY_CARE_PROVIDER_SITE_OTHER): Payer: Medicare Other | Admitting: Pharmacist

## 2016-07-09 DIAGNOSIS — G458 Other transient cerebral ischemic attacks and related syndromes: Secondary | ICD-10-CM | POA: Diagnosis not present

## 2016-07-09 DIAGNOSIS — I6350 Cerebral infarction due to unspecified occlusion or stenosis of unspecified cerebral artery: Secondary | ICD-10-CM | POA: Diagnosis not present

## 2016-07-09 DIAGNOSIS — Z9889 Other specified postprocedural states: Secondary | ICD-10-CM

## 2016-07-09 DIAGNOSIS — I059 Rheumatic mitral valve disease, unspecified: Secondary | ICD-10-CM | POA: Diagnosis not present

## 2016-07-09 DIAGNOSIS — I481 Persistent atrial fibrillation: Secondary | ICD-10-CM

## 2016-07-09 DIAGNOSIS — I4819 Other persistent atrial fibrillation: Secondary | ICD-10-CM

## 2016-07-09 DIAGNOSIS — Z7901 Long term (current) use of anticoagulants: Secondary | ICD-10-CM

## 2016-07-09 LAB — POCT INR: INR: 3.7

## 2016-08-10 ENCOUNTER — Ambulatory Visit (INDEPENDENT_AMBULATORY_CARE_PROVIDER_SITE_OTHER): Payer: Medicare Other | Admitting: *Deleted

## 2016-08-10 DIAGNOSIS — Z9889 Other specified postprocedural states: Secondary | ICD-10-CM

## 2016-08-10 DIAGNOSIS — Z7901 Long term (current) use of anticoagulants: Secondary | ICD-10-CM

## 2016-08-10 DIAGNOSIS — G458 Other transient cerebral ischemic attacks and related syndromes: Secondary | ICD-10-CM | POA: Diagnosis not present

## 2016-08-10 DIAGNOSIS — I6350 Cerebral infarction due to unspecified occlusion or stenosis of unspecified cerebral artery: Secondary | ICD-10-CM | POA: Diagnosis not present

## 2016-08-10 DIAGNOSIS — I4819 Other persistent atrial fibrillation: Secondary | ICD-10-CM

## 2016-08-10 DIAGNOSIS — I059 Rheumatic mitral valve disease, unspecified: Secondary | ICD-10-CM | POA: Diagnosis not present

## 2016-08-10 DIAGNOSIS — I481 Persistent atrial fibrillation: Secondary | ICD-10-CM | POA: Diagnosis not present

## 2016-08-10 LAB — POCT INR: INR: 3.7

## 2016-08-31 ENCOUNTER — Ambulatory Visit (INDEPENDENT_AMBULATORY_CARE_PROVIDER_SITE_OTHER): Payer: Medicare Other | Admitting: *Deleted

## 2016-08-31 DIAGNOSIS — I6350 Cerebral infarction due to unspecified occlusion or stenosis of unspecified cerebral artery: Secondary | ICD-10-CM

## 2016-08-31 DIAGNOSIS — Z7901 Long term (current) use of anticoagulants: Secondary | ICD-10-CM

## 2016-08-31 DIAGNOSIS — I4819 Other persistent atrial fibrillation: Secondary | ICD-10-CM

## 2016-08-31 DIAGNOSIS — I059 Rheumatic mitral valve disease, unspecified: Secondary | ICD-10-CM | POA: Diagnosis not present

## 2016-08-31 DIAGNOSIS — I481 Persistent atrial fibrillation: Secondary | ICD-10-CM

## 2016-08-31 DIAGNOSIS — G458 Other transient cerebral ischemic attacks and related syndromes: Secondary | ICD-10-CM

## 2016-08-31 DIAGNOSIS — Z9889 Other specified postprocedural states: Secondary | ICD-10-CM

## 2016-08-31 LAB — POCT INR: INR: 3.9

## 2016-09-09 ENCOUNTER — Other Ambulatory Visit: Payer: Self-pay | Admitting: Cardiology

## 2016-09-10 NOTE — Telephone Encounter (Signed)
FOLLOWED BY DR END

## 2016-09-13 ENCOUNTER — Other Ambulatory Visit: Payer: Self-pay | Admitting: *Deleted

## 2016-09-13 MED ORDER — ALLOPURINOL 300 MG PO TABS: 300.0000 mg | ORAL_TABLET | Freq: Every day | ORAL | 2 refills | Status: DC | PRN

## 2016-09-13 MED ORDER — ENALAPRIL MALEATE 20 MG PO TABS: 20.0000 mg | ORAL_TABLET | Freq: Two times a day (BID) | ORAL | 2 refills | Status: DC

## 2016-09-14 ENCOUNTER — Encounter: Payer: Self-pay | Admitting: Internal Medicine

## 2016-09-14 ENCOUNTER — Ambulatory Visit (INDEPENDENT_AMBULATORY_CARE_PROVIDER_SITE_OTHER): Payer: Medicare Other | Admitting: Internal Medicine

## 2016-09-14 ENCOUNTER — Ambulatory Visit (INDEPENDENT_AMBULATORY_CARE_PROVIDER_SITE_OTHER): Payer: Medicare Other | Admitting: *Deleted

## 2016-09-14 VITALS — BP 138/70 | HR 64 | Ht 70.0 in | Wt 174.4 lb

## 2016-09-14 DIAGNOSIS — K7460 Unspecified cirrhosis of liver: Secondary | ICD-10-CM

## 2016-09-14 DIAGNOSIS — I059 Rheumatic mitral valve disease, unspecified: Secondary | ICD-10-CM

## 2016-09-14 DIAGNOSIS — I482 Chronic atrial fibrillation: Secondary | ICD-10-CM

## 2016-09-14 DIAGNOSIS — I5032 Chronic diastolic (congestive) heart failure: Secondary | ICD-10-CM | POA: Diagnosis not present

## 2016-09-14 DIAGNOSIS — I4819 Other persistent atrial fibrillation: Secondary | ICD-10-CM

## 2016-09-14 DIAGNOSIS — I10 Essential (primary) hypertension: Secondary | ICD-10-CM | POA: Diagnosis not present

## 2016-09-14 DIAGNOSIS — I4821 Permanent atrial fibrillation: Secondary | ICD-10-CM

## 2016-09-14 DIAGNOSIS — Z7901 Long term (current) use of anticoagulants: Secondary | ICD-10-CM | POA: Diagnosis not present

## 2016-09-14 DIAGNOSIS — I481 Persistent atrial fibrillation: Secondary | ICD-10-CM

## 2016-09-14 DIAGNOSIS — Z9889 Other specified postprocedural states: Secondary | ICD-10-CM

## 2016-09-14 DIAGNOSIS — I6350 Cerebral infarction due to unspecified occlusion or stenosis of unspecified cerebral artery: Secondary | ICD-10-CM

## 2016-09-14 DIAGNOSIS — G458 Other transient cerebral ischemic attacks and related syndromes: Secondary | ICD-10-CM

## 2016-09-14 LAB — POCT INR: INR: 2.9

## 2016-09-14 MED ORDER — DILTIAZEM HCL ER COATED BEADS 300 MG PO CP24: 300.0000 mg | ORAL_CAPSULE | Freq: Every day | ORAL | 1 refills | Status: DC

## 2016-09-14 NOTE — Patient Instructions (Signed)
Medication Instructions:  Decrease Diltiazem (cardizem) to 300 mg daily.  Take lasix 40 mg daily.  Labwork: none  Testing/Procedures: none  Follow-Up: Your physician recommends that you schedule a follow-up appointment in: 3 months with Dr End.         If you need a refill on your cardiac medications before your next appointment, please call your pharmacy.

## 2016-09-14 NOTE — Progress Notes (Signed)
Follow-up Outpatient Visit Date: 09/14/2016  Primary Care Provider: Cassandria Anger, MD [place] [id number]  Chief Complaint: Lightheadedness  HPI:  Anthony Gould is a 83 y.o. year-old male with history of severe mitral regurgitation status post mechanical mitral valve replacement, permanent atrial fibrillation, hypertension, hyperlipidemia, TIA on warfarin (now on warfarin, aspirin, and dipyridamole) who presents for follow-up of a-fib and MVR. He has experienced intermittent lightheadedness and dizziness for years. At time, he has lightheadedness when standing or sitting up quickly. At other times, he feels as though he is off-balance when he closes his eyes. He denies any new neurologic symptoms. He has lost 10-15 pounds over the last year by changing his diet. He has been trying to stay well-hydrated. He no longer consumes any alcohol, after having been diagnosed with cirrhosis last year. He denies chest pain, palpitations, shortness of breath, orthopnea, and PND. He has not had any significant bleeding. He continues to play golf several days a week without limitations (he does not play if the temperature is > 90 degrees Fahrenheit).  --------------------------------------------------------------------------------------------------  1. Atrial fibrillation: Permanent, on coumadin.  He has not been able to tolerate metoprolol due to severe fatigue.  2. DM2: diet-controlled 3. HTN 4. Hyperlipidemia 5. 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  Patient was started on dipyridamole years ago for this.   6. 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  Echo (6/13) with EF 50-55%, mild LVH, mildly dilated LV, mechanical mitral valve functioning normally, biatrial enlargement, PA systolic pressure 38 mmHg.  Echo (11/14) with EF 50%, mild diffuse hypokinesis, normal mechanical mitral valve, moderately dilated RV with normal systolic  function. Echo (3/16) with mild LVH, EF 50-55%, mechanical mitral valve with mean gradient 5 mmHg, moderately dilated RV with normal systolic function, severe LAE, moderate TR, PASP 46 mmHg.  7. ETT-myoview (12/11): EF 46%, no evidence for ischemia or infarction, there were runs of NSVT reported.  ETT-Cardiolite (11/14): not gated, 5:02 exercise, no ischemia or infarction by perfusion images but had multiple runs of NSVT.  8. Diastolic CHF.  9. Gout 10. NSVT: ETT-Cardiolite in 11/14 with runs of NSVT.  Holter monitor (11/14) showed predominant atrial fibrillation with some PVCs, he had a few run of 3 beats NSVT.  11. Cirrhosis: Noted by CT. ?NASH versus ETOH.   Recent CV Pertinent Labs: Lab Results  Component Value Date   CHOL 132 02/06/2016   HDL 48.80 02/06/2016   LDLCALC 68 02/06/2016   TRIG 79.0 02/06/2016   CHOLHDL 3 02/06/2016   INR 3.9 08/31/2016   INR 2.5 (H) 09/20/2015   INR 3.3 06/06/2010   K 4.3 05/11/2016   MG 2.0 09/20/2015   BUN 34 (H) 05/11/2016   CREATININE 1.37 05/11/2016   CREATININE 1.40 (H) 04/01/2015    Past medical and surgical history were reviewed and updated in EPIC.  Outpatient Encounter Prescriptions as of 09/14/2016  Medication Sig  . allopurinol (ZYLOPRIM) 300 MG tablet Take 1 tablet (300 mg total) by mouth daily as needed.  [name] aspirin 81 MG EC tablet Take 81 mg by mouth daily.    [name] atorvastatin (LIPITOR) 20 MG tablet Take 1 tablet (20 mg total) by mouth 2 (two) times a week.  . bisoprolol (ZEBETA) 5 MG tablet TAKE 1/2 TABLET BY MOUTH DAILY  . diltiazem (CARDIZEM CD) 360 MG 24 hr capsule TAKE 1 TABLET BY MOUTH DAILY  . dipyridamole (PERSANTINE) 50 MG tablet Take 1 tablet (  50 mg total) by mouth 3 (three) times daily.  . enalapril (VASOTEC) 20 MG tablet Take 1 tablet (20 mg total) by mouth 2 (two) times daily.  . furosemide (LASIX) 40 MG tablet Take 1 tablet by mouth in the AM and 1/2 tablet by mouth in the PM  . latanoprost (XALATAN) 0.005 % ophthalmic  solution Place 1 drop into both eyes at bedtime.  . pantoprazole (PROTONIX) 40 MG tablet Take 1 tablet (40 mg total) by mouth daily.  . promethazine (PHENERGAN) 25 MG tablet Take 1 tablet (25 mg total) by mouth every 8 (eight) hours as needed for nausea or vomiting.  . warfarin (COUMADIN) 5 MG tablet Take as directed by the Anticoagulation Clinic  . [DISCONTINUED] Milk Thistle 1000 MG CAPS 1 po qd   No facility-administered encounter medications on file as of 09/14/2016.     Allergies: Sulfonamide derivatives  Social History   Social History  . Marital status: Married    Spouse name: Anthony Gould  . Number of children: 2  . Years of education: N/A   Occupational History  . 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 Smoking status: Never Smoker  . Smokeless tobacco: Never Used  . Alcohol use No  . Drug use: No  . Sexual activity: Not on file   Other Topics Concern  . Not on file   Social History Narrative   HSG, many management courses   Married '58   2 sons - '61, '62; 1 grandchild   Nonsmoker          Family History  Problem Relation Age of Onset  . Coronary artery disease Father   . Diabetes Other   . Coronary artery disease Other   . Prostate cancer Neg Hx   . Colon cancer Neg Hx     Review of Systems: A 12-system review of systems was performed and was negative except as noted in the HPI.  --------------------------------------------------------------------------------------------------  Physical Exam: BP 138/70   Pulse 64   Ht 5\' 10"  (1.778 m)   Wt 174 lb 6.4 oz (79.1 kg)   BMI 25.02 kg/m   Position Blood pressure (mmHg) Heart rate (bpm)  Lying 143/43 56  Sitting 143/44 65  Standing 148/39 68  Standing (3 minutes) 148/38 58    General:  Well-developed, well-nourished man, seated comfortably in the exam room. HEENT: No conjunctival pallor or scleral icterus.  Moist mucous membranes.  OP clear. Neck: Supple without lymphadenopathy,  thyromegaly, JVD, or HJR.  No carotid bruit. Lungs: Normal work of breathing.  Clear to auscultation bilaterally without wheezes or crackles. Heart: Irregularly irregular without murmurs, rubs, or gallops.  Mechanical S1 noted. Non-displaced PMI. Abd: Bowel sounds present.  Soft, NT/ND without hepatosplenomegaly Ext: No lower extremity edema.  Radial, PT, and DP pulses are 2+ bilaterally. Skin: warm and dry without rash  EKG:  Atrial fibrillation (ventricular rate 64 bpm) with PVC versus aberrancy and rightward axis.  Lab Results  Component Value Date   WBC 8.5 05/11/2016   HGB 13.5 05/11/2016   HCT 40.2 05/11/2016   MCV 98.3 05/11/2016   PLT 238.0 05/11/2016    Lab Results  Component Value Date   NA 138 05/11/2016   K 4.3 05/11/2016   CL 104 05/11/2016   CO2 25 05/11/2016   BUN 34 (H) 05/11/2016   CREATININE 1.37 05/11/2016   GLUCOSE 140 (H) 05/11/2016   ALT 15 05/11/2016    Lab Results  Component Value  Date   CHOL 132 02/06/2016   HDL 48.80 02/06/2016   LDLCALC 68 02/06/2016   TRIG 79.0 02/06/2016   CHOLHDL 3 02/06/2016    --------------------------------------------------------------------------------------------------  ASSESSMENT AND PLAN: Permanent atrial fibrillation Heart rate has been persistently low normal to slow in the setting of atrial fibrillation with multiple AV-nodal blocking agents. We have agreed to decrease diltiazem to 300 mg daily and continue his current dose of bisoprolol (2.5 mg daily).  Chronic diastolic heart failure with history of mitral regurgitation status post mechanical MVR No symptoms of heart failure or new neurologic events. Last echo in 05/2014 showed low-normal LVEF with reasonable MVR parameters. We will continue with warfarin (goal INR 3-3.5), ASA, and dipyridamole, given h/o TIAs. Given orthostatic symptoms and low diastolic pressures on orthostatic VS today, we will decrease furosemide to 40 mg daily (rather than 40 mg QAM and 20  mg QPM). I encouraged Mr. Hallenbeck to stay well-hydrated, particularly when outdoors in warm temperatures.  Hypertension Systolic blood pressure borderline to mildly elevated. However, in the setting of orthostatic symptoms, we will decrease furosemide and diltiazem, as above.  Non-sustained ventricular tachycardia No symptoms of sustained ventricular arrhythmias. We will continue with low-dose bisoprolol.  Cirrhosis Incidentally noted on CT. Nausea has improved since last year. Mr. Mahoney no longer consumes alcohol, which I encouraged him to continue. I will defer further management to Dr. Alain Marion.  Follow-up: Return to clinic in 3 months.  Nelva Bush, MD 09/16/2016 11:44 AM

## 2016-09-16 ENCOUNTER — Encounter: Payer: Self-pay | Admitting: Internal Medicine

## 2016-09-16 DIAGNOSIS — I5032 Chronic diastolic (congestive) heart failure: Secondary | ICD-10-CM | POA: Insufficient documentation

## 2016-10-04 ENCOUNTER — Encounter: Payer: Self-pay | Admitting: Internal Medicine

## 2016-10-08 ENCOUNTER — Ambulatory Visit (INDEPENDENT_AMBULATORY_CARE_PROVIDER_SITE_OTHER): Payer: Medicare Other | Admitting: Pharmacist

## 2016-10-08 DIAGNOSIS — Z7901 Long term (current) use of anticoagulants: Secondary | ICD-10-CM | POA: Diagnosis not present

## 2016-10-08 DIAGNOSIS — G458 Other transient cerebral ischemic attacks and related syndromes: Secondary | ICD-10-CM | POA: Diagnosis not present

## 2016-10-08 DIAGNOSIS — I6350 Cerebral infarction due to unspecified occlusion or stenosis of unspecified cerebral artery: Secondary | ICD-10-CM

## 2016-10-08 DIAGNOSIS — Z9889 Other specified postprocedural states: Secondary | ICD-10-CM

## 2016-10-08 DIAGNOSIS — I481 Persistent atrial fibrillation: Secondary | ICD-10-CM

## 2016-10-08 DIAGNOSIS — I059 Rheumatic mitral valve disease, unspecified: Secondary | ICD-10-CM | POA: Diagnosis not present

## 2016-10-08 DIAGNOSIS — I4819 Other persistent atrial fibrillation: Secondary | ICD-10-CM

## 2016-10-08 LAB — POCT INR: INR: 3

## 2016-10-08 MED ORDER — WARFARIN SODIUM 5 MG PO TABS: ORAL_TABLET | ORAL | 1 refills | Status: DC

## 2016-10-17 ENCOUNTER — Encounter: Payer: Self-pay | Admitting: Internal Medicine

## 2016-10-17 ENCOUNTER — Ambulatory Visit (INDEPENDENT_AMBULATORY_CARE_PROVIDER_SITE_OTHER): Payer: Medicare Other | Admitting: Internal Medicine

## 2016-10-17 ENCOUNTER — Other Ambulatory Visit (INDEPENDENT_AMBULATORY_CARE_PROVIDER_SITE_OTHER): Payer: Medicare Other

## 2016-10-17 ENCOUNTER — Other Ambulatory Visit: Payer: Self-pay | Admitting: Internal Medicine

## 2016-10-17 DIAGNOSIS — E1159 Type 2 diabetes mellitus with other circulatory complications: Secondary | ICD-10-CM | POA: Diagnosis not present

## 2016-10-17 DIAGNOSIS — K7460 Unspecified cirrhosis of liver: Secondary | ICD-10-CM | POA: Diagnosis not present

## 2016-10-17 DIAGNOSIS — I480 Paroxysmal atrial fibrillation: Secondary | ICD-10-CM

## 2016-10-17 DIAGNOSIS — M1000 Idiopathic gout, unspecified site: Secondary | ICD-10-CM | POA: Diagnosis not present

## 2016-10-17 LAB — BASIC METABOLIC PANEL
BUN: 45 mg/dL — ABNORMAL HIGH (ref 6–23)
CO2: 27 meq/L (ref 19–32)
Calcium: 9.9 mg/dL (ref 8.4–10.5)
Chloride: 106 mEq/L (ref 96–112)
Creatinine, Ser: 1.56 mg/dL — ABNORMAL HIGH (ref 0.40–1.50)
GFR: 45.59 mL/min — AB (ref >60.00)
GLUCOSE: 125 mg/dL — AB (ref 70–99)
POTASSIUM: 4.9 meq/L (ref 3.5–5.1)
SODIUM: 138 meq/L (ref 135–145)

## 2016-10-17 LAB — CBC WITH DIFFERENTIAL/PLATELET
BASOS ABS: 0.1 10*3/uL (ref 0.0–0.1)
Basophils Relative: 0.9 % (ref 0.0–3.0)
Eosinophils Absolute: 0.5 10*3/uL (ref 0.0–0.7)
Eosinophils Relative: 7.3 % — ABNORMAL HIGH (ref 0.0–5.0)
HEMATOCRIT: 41.9 % (ref 39.0–52.0)
Hemoglobin: 14.1 g/dL (ref 13.0–17.0)
LYMPHS ABS: 1.4 10*3/uL (ref 0.7–4.0)
LYMPHS PCT: 20.3 % (ref 12.0–46.0)
MCHC: 33.6 g/dL (ref 30.0–36.0)
MCV: 98.5 fl (ref 78.0–100.0)
MONOS PCT: 6.9 % (ref 3.0–12.0)
Monocytes Absolute: 0.5 10*3/uL (ref 0.1–1.0)
NEUTROS ABS: 4.4 10*3/uL (ref 1.4–7.7)
NEUTROS PCT: 64.6 % (ref 43.0–77.0)
Platelets: 231 10*3/uL (ref 150.0–400.0)
RBC: 4.26 Mil/uL (ref 4.22–5.81)
RDW: 15 % (ref 11.5–15.5)
WBC: 6.8 10*3/uL (ref 4.0–10.5)

## 2016-10-17 LAB — HEMOGLOBIN A1C: Hgb A1c MFr Bld: 6 % (ref 4.6–6.5)

## 2016-10-17 LAB — TSH: TSH: 9.46 u[IU]/mL — ABNORMAL HIGH (ref 0.35–4.50)

## 2016-10-17 MED ORDER — CELECOXIB 200 MG PO CAPS: 200.0000 mg | ORAL_CAPSULE | Freq: Two times a day (BID) | ORAL | 0 refills | Status: DC

## 2016-10-17 MED ORDER — LEVOTHYROXINE SODIUM 25 MCG PO TABS: 25.0000 ug | ORAL_TABLET | Freq: Every day | ORAL | 11 refills | Status: DC

## 2016-10-17 NOTE — Progress Notes (Signed)
Subjective:  Patient ID: Anthony Gould, male    DOB: 12/27/1933  Age: 83 y.o. MRN: [id number]  CC: Follow-up   HPI Anthony Gould presents for n/v with abd pain - no relapse, HTN, gout f/u. No ETOH at all x12 months Asked for Celebrex - rare use   Outpatient Medications Prior to Visit  Medication Sig Dispense Refill  . allopurinol (ZYLOPRIM) 300 MG tablet Take 1 tablet (300 mg total) by mouth daily as needed. 90 tablet 2  . aspirin 81 MG EC tablet Take 81 mg by mouth daily.      [name] atorvastatin (LIPITOR) 20 MG tablet Take 1 tablet (20 mg total) by mouth 2 (two) times a week. 30 tablet 11  . bisoprolol (ZEBETA) 5 MG tablet TAKE 1/2 TABLET BY MOUTH DAILY 45 tablet 1  . diltiazem (CARDIZEM CD) 300 MG 24 hr capsule Take 1 capsule (300 mg total) by mouth daily. 90 capsule 1  . dipyridamole (PERSANTINE) 50 MG tablet Take 1 tablet (50 mg total) by mouth 3 (three) times daily. 270 tablet 3  . enalapril (VASOTEC) 20 MG tablet Take 1 tablet (20 mg total) by mouth 2 (two) times daily. 180 tablet 2  . furosemide (LASIX) 40 MG tablet Take 1 tablet (40 mg total) by mouth daily.    [name] latanoprost (XALATAN) 0.005 % ophthalmic solution Place 1 drop into both eyes at bedtime.    . Milk Thistle 1000 MG CAPS Take 1 capsule by mouth daily.    . pantoprazole (PROTONIX) 40 MG tablet Take 1 tablet (40 mg total) by mouth daily. 90 tablet 3  . warfarin (COUMADIN) 5 MG tablet Take as directed by the Anticoagulation Clinic 135 tablet 1  . promethazine (PHENERGAN) 25 MG tablet Take 1 tablet (25 mg total) by mouth every 8 (eight) hours as needed for nausea or vomiting. (Patient not taking: Reported on 10/17/2016) 20 tablet 1   No facility-administered medications prior to visit.     ROS Review of Systems  Constitutional: Negative for appetite change, fatigue and unexpected weight change.  HENT: Negative for congestion, nosebleeds, sneezing, sore throat and trouble swallowing.   Eyes: Negative for itching  and visual disturbance.  Respiratory: Negative for cough.   Cardiovascular: Negative for chest pain, palpitations and leg swelling.  Gastrointestinal: Negative for abdominal distention, blood in stool, diarrhea and nausea.  Genitourinary: Negative for frequency and hematuria.  Musculoskeletal: Negative for back pain, gait problem, joint swelling and neck pain.  Skin: Negative for rash.  Neurological: Negative for dizziness, tremors, speech difficulty and weakness.  Psychiatric/Behavioral: Negative for agitation, dysphoric mood and sleep disturbance. The patient is not nervous/anxious.     Objective:  BP (!) 152/80 (BP Location: Left Arm, Patient Position: Sitting, Cuff Size: Normal)   Pulse 91   Temp 97.9 F (36.6 C) (Oral)   Resp 12   Ht 5\' 10"  (1.778 m)   Wt 171 lb (77.6 kg)   SpO2 98%   BMI 24.54 kg/m   BP Readings from Last 3 Encounters:  10/17/16 (!) 152/80  09/14/16 138/70  06/18/16 126/60    Wt Readings from Last 3 Encounters:  10/17/16 171 lb (77.6 kg)  09/14/16 174 lb 6.4 oz (79.1 kg)  06/18/16 178 lb (80.7 kg)    Physical Exam  Constitutional: He is oriented to person, place, and time. He appears well-developed. No distress.  NAD  HENT:  Mouth/Throat: Oropharynx is clear and moist.  Eyes: Pupils are equal, round, and reactive  to light. Conjunctivae are normal.  Neck: Normal range of motion. No JVD present. No thyromegaly present.  Cardiovascular: Normal rate, regular rhythm, normal heart sounds and intact distal pulses.  Exam reveals no gallop and no friction rub.   No murmur heard. Pulmonary/Chest: Effort normal and breath sounds normal. No respiratory distress. He has no wheezes. He has no rales. He exhibits no tenderness.  Abdominal: Soft. Bowel sounds are normal. He exhibits no distension and no mass. There is no tenderness. There is no rebound and no guarding.  Musculoskeletal: Normal range of motion. He exhibits no edema or tenderness.  Lymphadenopathy:      He has no cervical adenopathy.  Neurological: He is alert and oriented to person, place, and time. He has normal reflexes. No cranial nerve deficit. He exhibits normal muscle tone. He displays a negative Romberg sign. Coordination and gait normal.  Skin: Skin is warm and dry. No rash noted.  Psychiatric: He has a normal mood and affect. His behavior is normal. Judgment and thought content normal.  valve click  Lab Results  Component Value Date   WBC 8.5 05/11/2016   HGB 13.5 05/11/2016   HCT 40.2 05/11/2016   PLT 238.0 05/11/2016   GLUCOSE 140 (H) 05/11/2016   CHOL 132 02/06/2016   TRIG 79.0 02/06/2016   HDL 48.80 02/06/2016   LDLCALC 68 02/06/2016   ALT 15 05/11/2016   AST 23 05/11/2016   NA 138 05/11/2016   K 4.3 05/11/2016   CL 104 05/11/2016   CREATININE 1.37 05/11/2016   BUN 34 (H) 05/11/2016   CO2 25 05/11/2016   TSH 6.61 (H) 09/20/2015   PSA 0.87 10/20/2014   INR 3.0 10/08/2016   HGBA1C 6.0 05/11/2016    Dg Chest 2 View  Result Date: 11/14/2015 CLINICAL DATA:  Followup pneumonia. History of mitral valve replacement, hypertension, diabetes EXAM: CHEST  2 VIEW COMPARISON:  09/02/2015 FINDINGS: Previously seen right upper lobe pneumonia has resolved. No confluent opacities. No effusions. Heart is normal size. Prior valve replacement. No acute bony abnormality. IMPRESSION: Interval resolution of the previously seen right upper lobe pneumonia. Cardiomegaly. No acute findings. Electronically Signed   By: Rolm Baptise M.D.   On: 11/14/2015 10:19    Assessment & Plan:   There are no diagnoses linked to this encounter. I am having Mr. Deeb maintain his aspirin, latanoprost, promethazine, atorvastatin, pantoprazole, dipyridamole, bisoprolol, enalapril, allopurinol, Milk Thistle, diltiazem, furosemide, and warfarin.  No orders of the defined types were placed in this encounter.    Follow-up: No Follow-up on file.  Walker Kehr, MD

## 2016-10-17 NOTE — Assessment & Plan Note (Signed)
n/v with abd pain - no relapse, HTN, gout f/u. No ETOH at all x12 months

## 2016-10-17 NOTE — Assessment & Plan Note (Signed)
Coumadin 

## 2016-10-17 NOTE — Assessment & Plan Note (Signed)
Celebrex - rare

## 2016-10-17 NOTE — Patient Instructions (Signed)
MC well w/Jill 

## 2016-10-17 NOTE — Assessment & Plan Note (Signed)
Labs

## 2016-10-18 ENCOUNTER — Other Ambulatory Visit (INDEPENDENT_AMBULATORY_CARE_PROVIDER_SITE_OTHER): Payer: Medicare Other

## 2016-10-18 DIAGNOSIS — R946 Abnormal results of thyroid function studies: Secondary | ICD-10-CM | POA: Diagnosis not present

## 2016-10-18 DIAGNOSIS — R7989 Other specified abnormal findings of blood chemistry: Secondary | ICD-10-CM

## 2016-10-18 LAB — T4, FREE: FREE T4: 0.8 ng/dL (ref 0.60–1.60)

## 2016-11-05 ENCOUNTER — Ambulatory Visit (INDEPENDENT_AMBULATORY_CARE_PROVIDER_SITE_OTHER): Payer: Medicare Other | Admitting: Pharmacist

## 2016-11-05 DIAGNOSIS — Z7901 Long term (current) use of anticoagulants: Secondary | ICD-10-CM

## 2016-11-05 DIAGNOSIS — G458 Other transient cerebral ischemic attacks and related syndromes: Secondary | ICD-10-CM | POA: Diagnosis not present

## 2016-11-05 DIAGNOSIS — I480 Paroxysmal atrial fibrillation: Secondary | ICD-10-CM | POA: Diagnosis not present

## 2016-11-05 DIAGNOSIS — Z9889 Other specified postprocedural states: Secondary | ICD-10-CM

## 2016-11-05 DIAGNOSIS — I6350 Cerebral infarction due to unspecified occlusion or stenosis of unspecified cerebral artery: Secondary | ICD-10-CM

## 2016-11-05 DIAGNOSIS — I059 Rheumatic mitral valve disease, unspecified: Secondary | ICD-10-CM

## 2016-11-05 LAB — POCT INR: INR: 2.1

## 2016-11-19 DIAGNOSIS — H2513 Age-related nuclear cataract, bilateral: Secondary | ICD-10-CM | POA: Diagnosis not present

## 2016-11-19 DIAGNOSIS — H35033 Hypertensive retinopathy, bilateral: Secondary | ICD-10-CM | POA: Diagnosis not present

## 2016-11-19 DIAGNOSIS — H40053 Ocular hypertension, bilateral: Secondary | ICD-10-CM | POA: Diagnosis not present

## 2016-11-22 ENCOUNTER — Ambulatory Visit (INDEPENDENT_AMBULATORY_CARE_PROVIDER_SITE_OTHER): Payer: Medicare Other

## 2016-11-22 DIAGNOSIS — G458 Other transient cerebral ischemic attacks and related syndromes: Secondary | ICD-10-CM

## 2016-11-22 DIAGNOSIS — Z7901 Long term (current) use of anticoagulants: Secondary | ICD-10-CM | POA: Diagnosis not present

## 2016-11-22 DIAGNOSIS — Z9889 Other specified postprocedural states: Secondary | ICD-10-CM

## 2016-11-22 DIAGNOSIS — I059 Rheumatic mitral valve disease, unspecified: Secondary | ICD-10-CM

## 2016-11-22 DIAGNOSIS — I480 Paroxysmal atrial fibrillation: Secondary | ICD-10-CM | POA: Diagnosis not present

## 2016-11-22 DIAGNOSIS — I6350 Cerebral infarction due to unspecified occlusion or stenosis of unspecified cerebral artery: Secondary | ICD-10-CM

## 2016-11-22 LAB — POCT INR: INR: 4.2

## 2016-11-27 ENCOUNTER — Ambulatory Visit: Payer: Medicare Other | Admitting: Internal Medicine

## 2016-11-30 ENCOUNTER — Telehealth: Payer: Self-pay

## 2016-11-30 NOTE — Telephone Encounter (Addendum)
Prior auth for Dipyridamole 50mg  submitted to [facility]. Fax received this PM from [facility] with approval of Dipyridamole 50 mg valid till 11/30/2017. Pharmacy notified.

## 2016-12-10 ENCOUNTER — Ambulatory Visit (INDEPENDENT_AMBULATORY_CARE_PROVIDER_SITE_OTHER): Payer: Medicare Other

## 2016-12-10 DIAGNOSIS — I6350 Cerebral infarction due to unspecified occlusion or stenosis of unspecified cerebral artery: Secondary | ICD-10-CM

## 2016-12-10 DIAGNOSIS — G458 Other transient cerebral ischemic attacks and related syndromes: Secondary | ICD-10-CM

## 2016-12-10 DIAGNOSIS — I059 Rheumatic mitral valve disease, unspecified: Secondary | ICD-10-CM

## 2016-12-10 DIAGNOSIS — Z9889 Other specified postprocedural states: Secondary | ICD-10-CM | POA: Diagnosis not present

## 2016-12-10 DIAGNOSIS — I480 Paroxysmal atrial fibrillation: Secondary | ICD-10-CM

## 2016-12-10 DIAGNOSIS — Z7901 Long term (current) use of anticoagulants: Secondary | ICD-10-CM | POA: Diagnosis not present

## 2016-12-10 LAB — POCT INR: INR: 2.8

## 2016-12-22 IMAGING — CR DG HIP (WITH OR WITHOUT PELVIS) 2-3V*R*
4 series · 4 of 4 positions shown · non-contrast
Comparison: None.

CLINICAL DATA: New generalized right hip and groin pain since
yesterday. No specific trauma.

EXAM:
DG HIP (WITH OR WITHOUT PELVIS) 2-3V RIGHT

[w hip lat right]
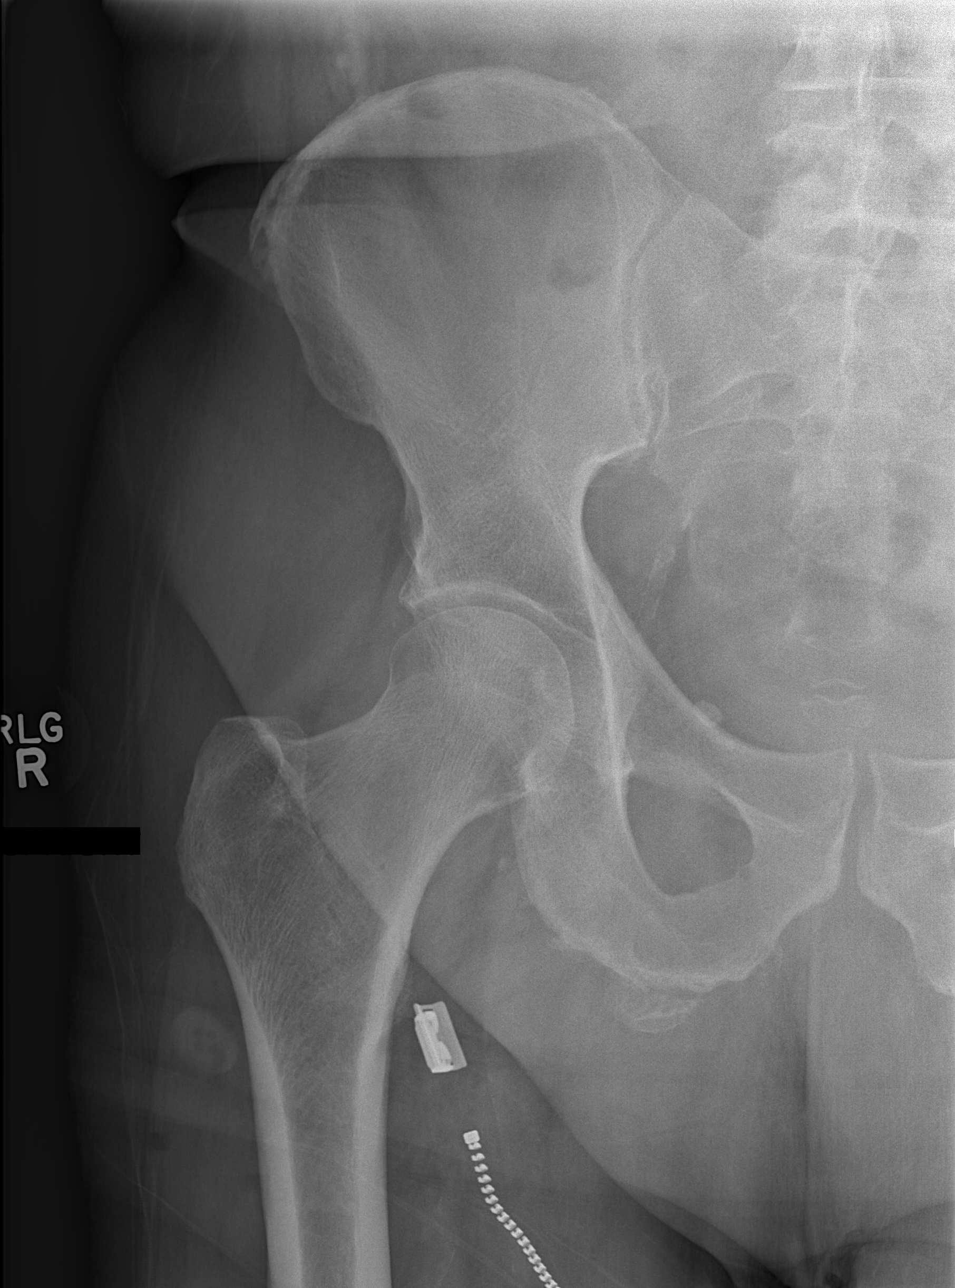

[t pelvis ap]
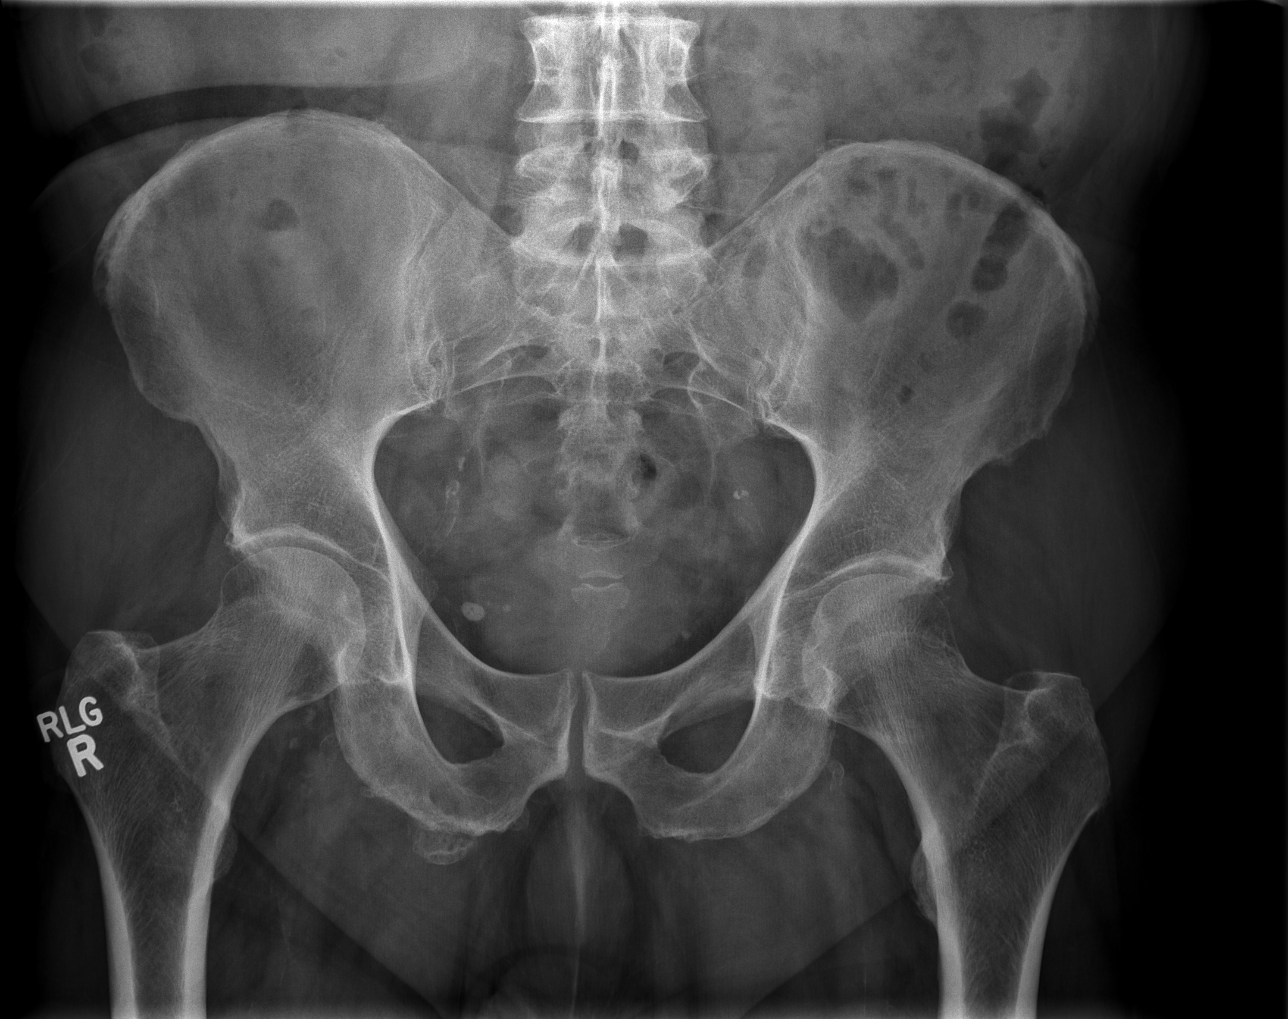

[t hip ap right]
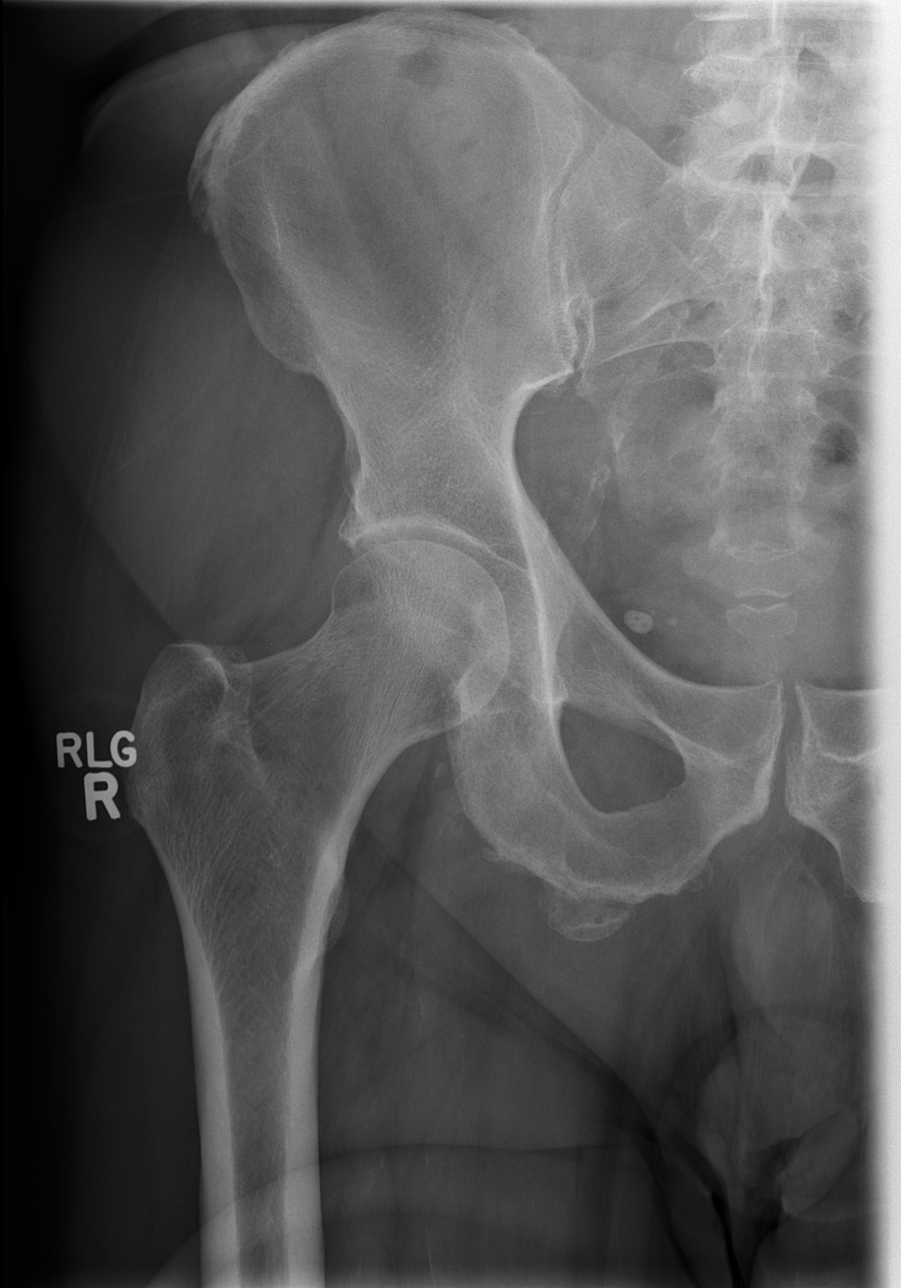

[t hip frog leg right]
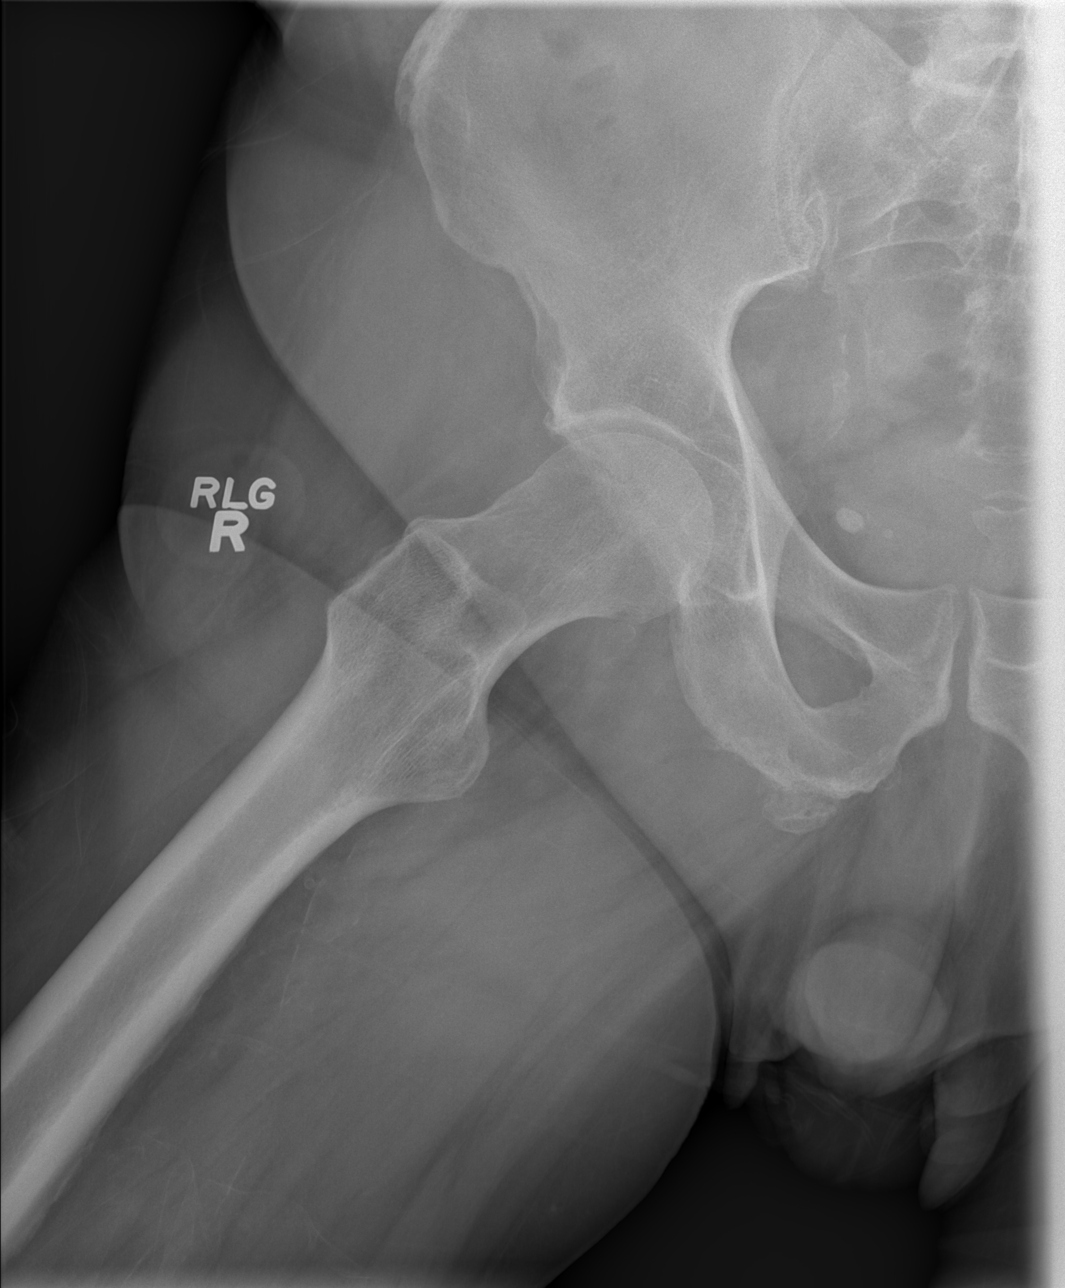

[4 of 4 positions shown; findings below may reference images not displayed]

FINDINGS: Negative for fracture or dislocation. No erosion or focal bone
lesion. Heterotopic ossification asymmetrically about the right
ischial tuberosity compatible with remote avulsion injury. No
degenerative hip narrowing. Atherosclerosis.
IMPRESSION: No acute finding or degenerative joint narrowing.

## 2016-12-22 IMAGING — CR DG CHEST 2V
2 series · 2 of 2 positions shown · non-contrast
Comparison: None

CLINICAL DATA: Nausea

EXAM:
CHEST  2 VIEW

[w chest pa]
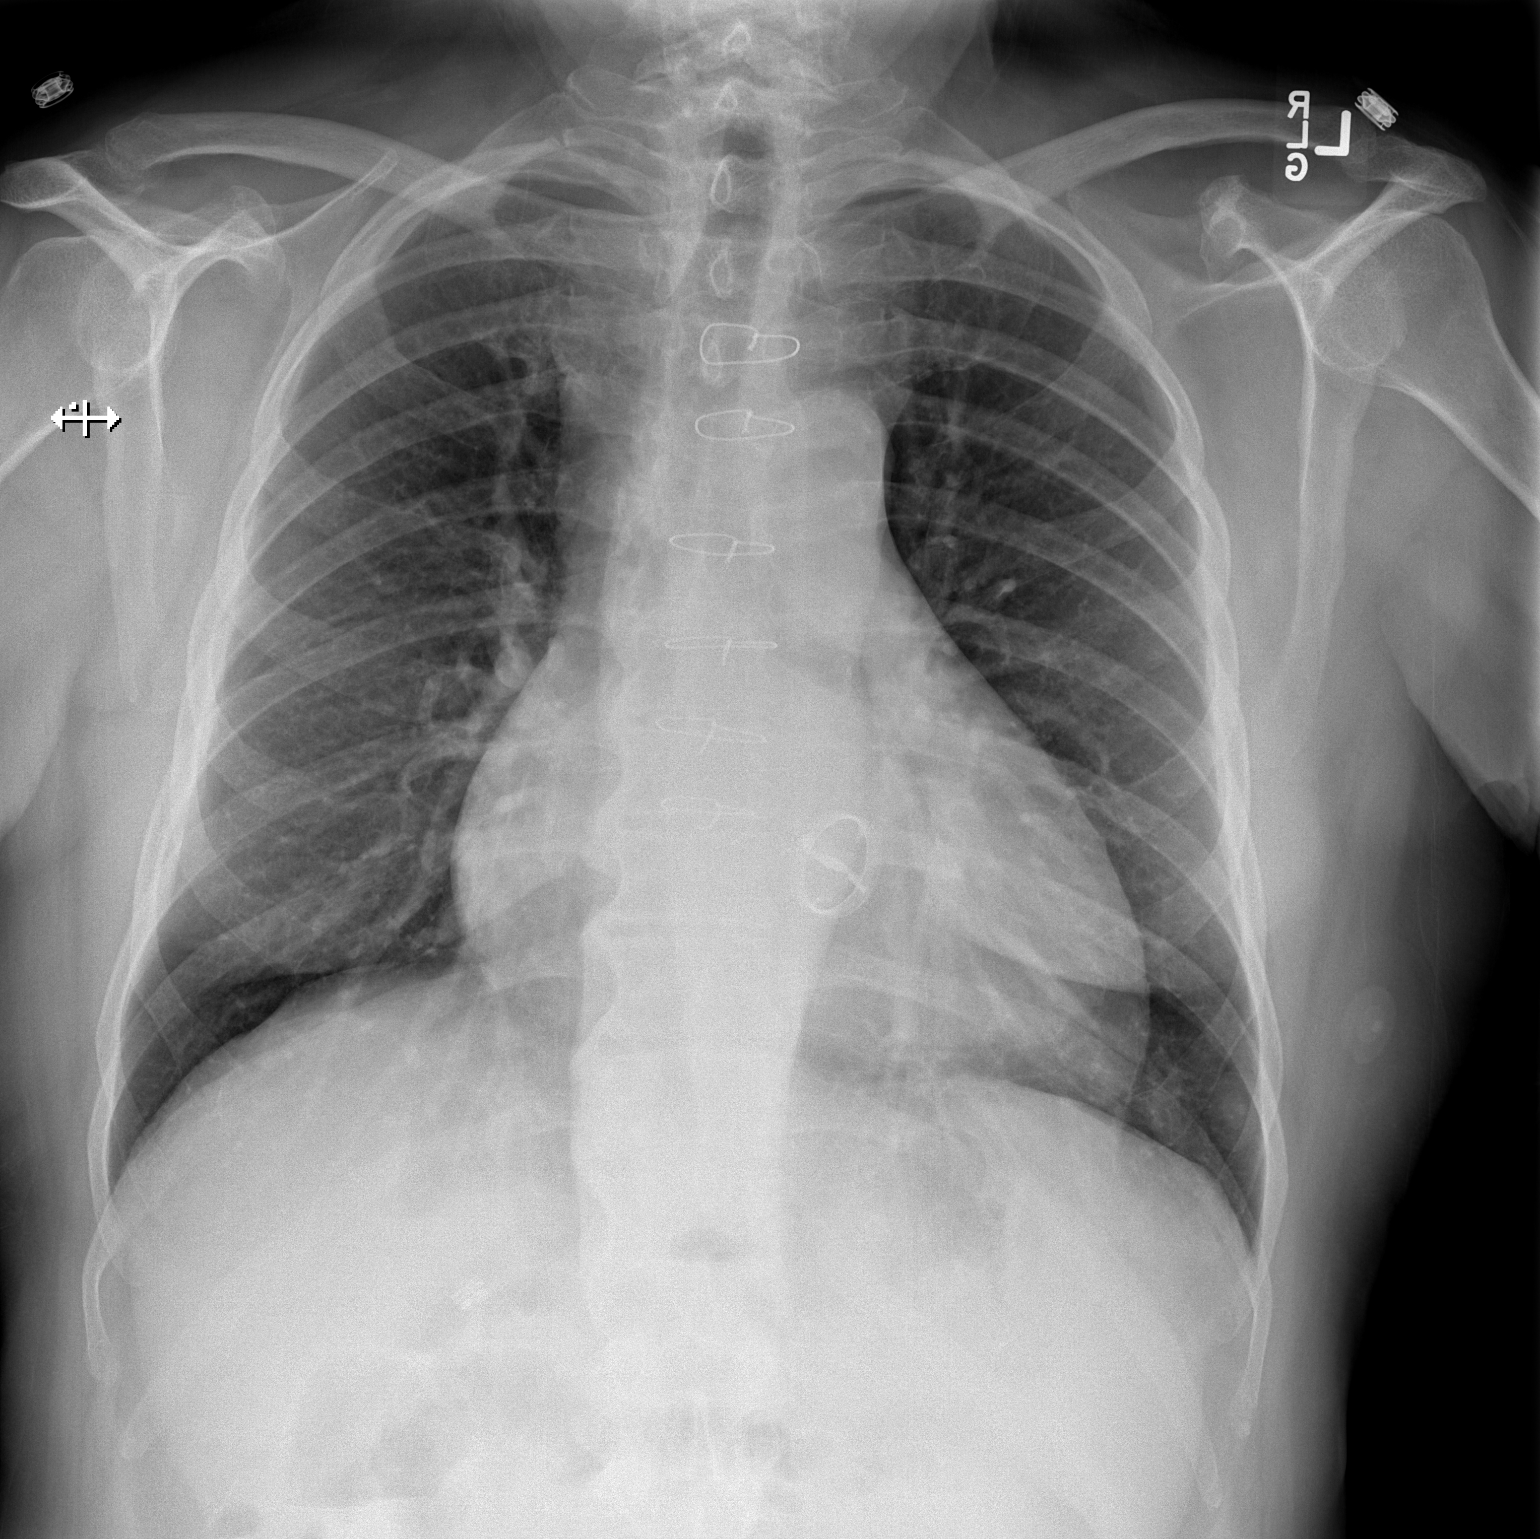

[w chest lat]
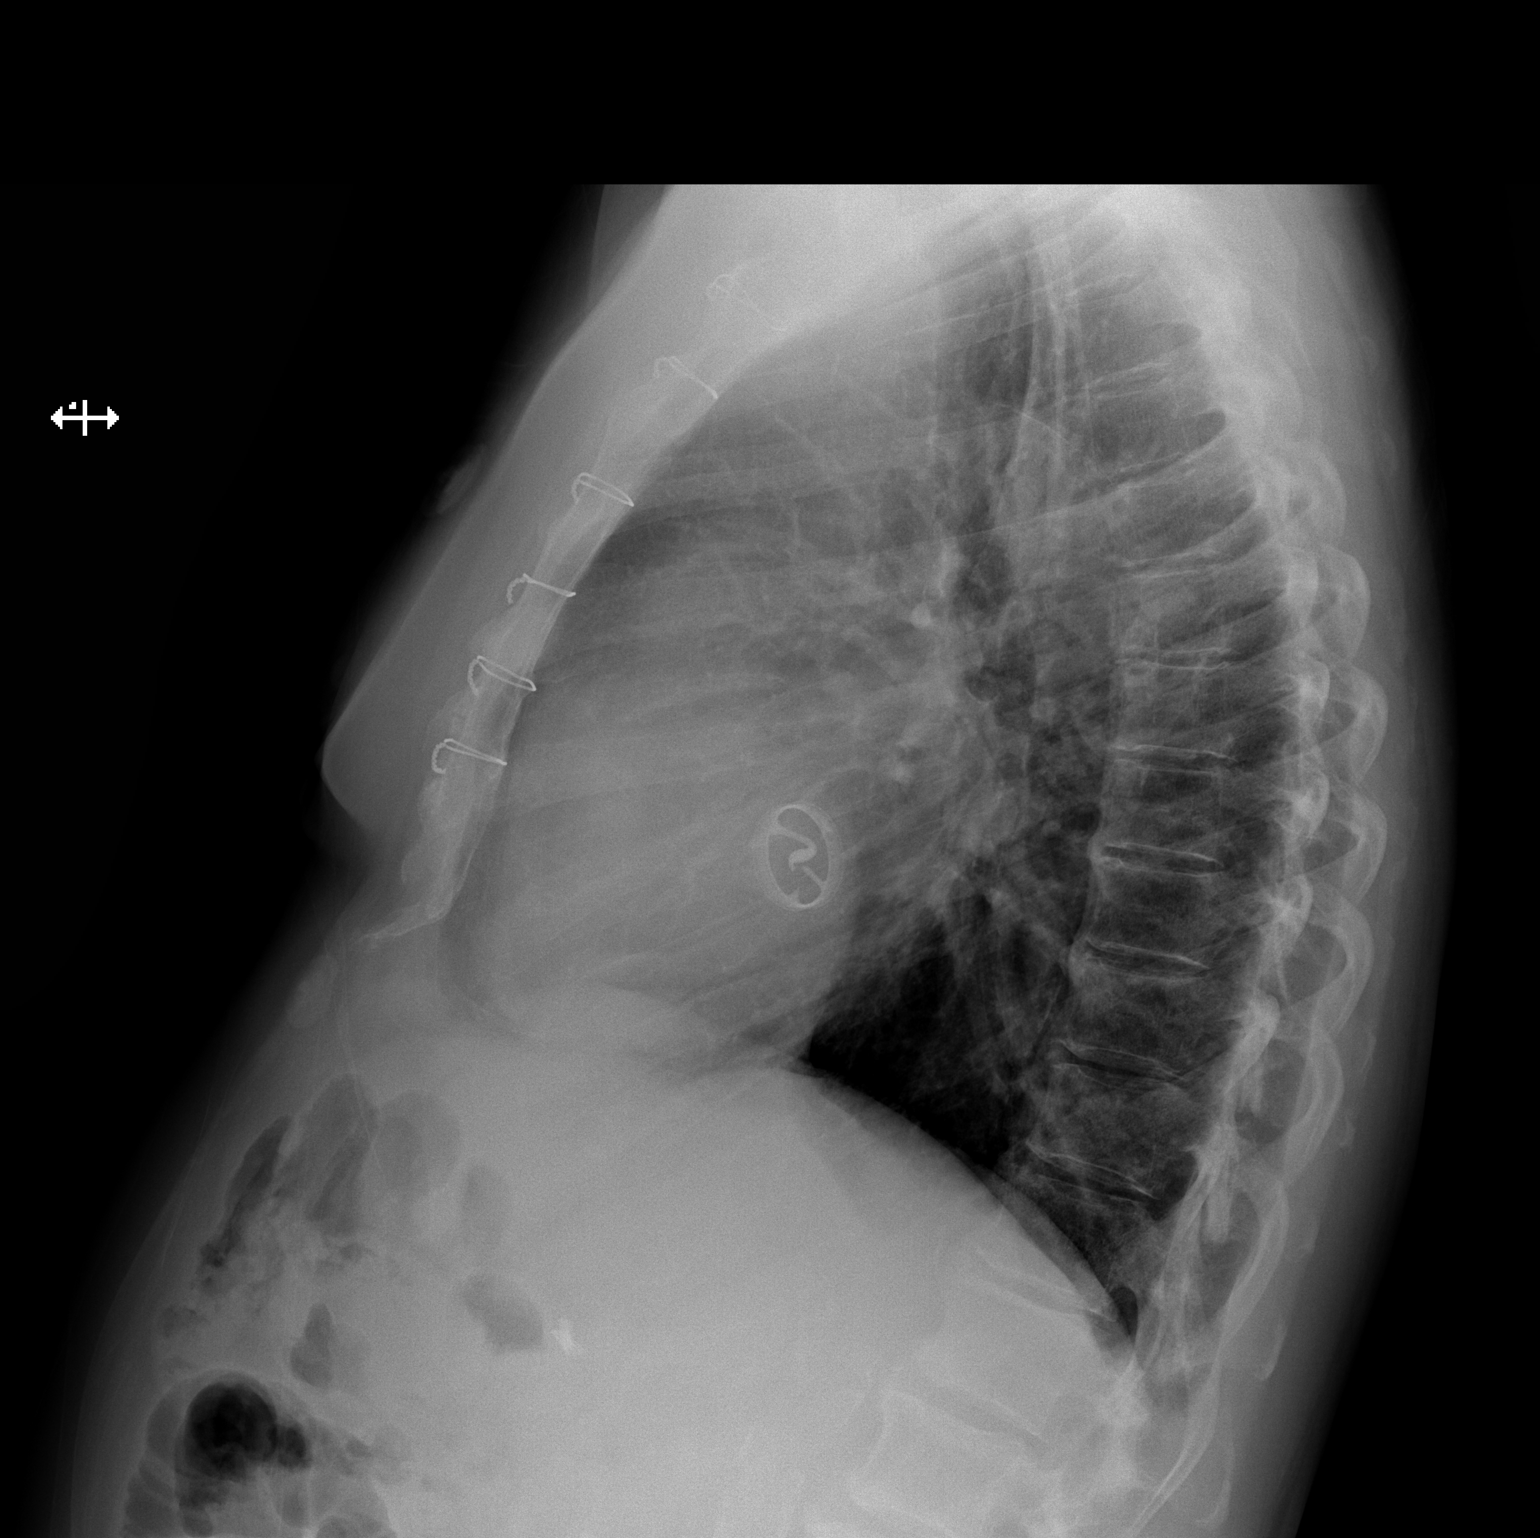

[2 of 2 positions shown; findings below may reference images not displayed]

FINDINGS: Cardiomegaly. The patient is status post median sternotomy for
mitral valve replacement. Negative aortic and hilar contours. There
is no edema, consolidation, effusion, or pneumothorax.
Cholecystectomy changes.
IMPRESSION: 1. No acute finding.
2. Cardiomegaly.

## 2016-12-24 ENCOUNTER — Ambulatory Visit: Payer: Medicare Other | Admitting: Internal Medicine

## 2016-12-28 ENCOUNTER — Ambulatory Visit (INDEPENDENT_AMBULATORY_CARE_PROVIDER_SITE_OTHER): Payer: Medicare Other | Admitting: Internal Medicine

## 2016-12-28 ENCOUNTER — Encounter (INDEPENDENT_AMBULATORY_CARE_PROVIDER_SITE_OTHER): Payer: Self-pay

## 2016-12-28 ENCOUNTER — Ambulatory Visit (INDEPENDENT_AMBULATORY_CARE_PROVIDER_SITE_OTHER): Payer: Medicare Other

## 2016-12-28 ENCOUNTER — Encounter: Payer: Self-pay | Admitting: Internal Medicine

## 2016-12-28 VITALS — BP 116/64 | HR 64 | Ht 70.0 in | Wt 173.0 lb

## 2016-12-28 DIAGNOSIS — I480 Paroxysmal atrial fibrillation: Secondary | ICD-10-CM

## 2016-12-28 DIAGNOSIS — I482 Chronic atrial fibrillation: Secondary | ICD-10-CM | POA: Diagnosis not present

## 2016-12-28 DIAGNOSIS — I4821 Permanent atrial fibrillation: Secondary | ICD-10-CM

## 2016-12-28 DIAGNOSIS — I10 Essential (primary) hypertension: Secondary | ICD-10-CM

## 2016-12-28 DIAGNOSIS — Z7901 Long term (current) use of anticoagulants: Secondary | ICD-10-CM

## 2016-12-28 DIAGNOSIS — I6350 Cerebral infarction due to unspecified occlusion or stenosis of unspecified cerebral artery: Secondary | ICD-10-CM

## 2016-12-28 DIAGNOSIS — I059 Rheumatic mitral valve disease, unspecified: Secondary | ICD-10-CM | POA: Diagnosis not present

## 2016-12-28 DIAGNOSIS — Z952 Presence of prosthetic heart valve: Secondary | ICD-10-CM

## 2016-12-28 DIAGNOSIS — Z9889 Other specified postprocedural states: Secondary | ICD-10-CM | POA: Diagnosis not present

## 2016-12-28 DIAGNOSIS — G458 Other transient cerebral ischemic attacks and related syndromes: Secondary | ICD-10-CM

## 2016-12-28 LAB — POCT INR: INR: 2.5

## 2016-12-28 MED ORDER — DILTIAZEM HCL ER COATED BEADS 240 MG PO CP24: 240.0000 mg | ORAL_CAPSULE | Freq: Every day | ORAL | 3 refills | Status: DC

## 2016-12-28 MED ORDER — ENALAPRIL MALEATE 20 MG PO TABS: 20.0000 mg | ORAL_TABLET | Freq: Every day | ORAL | 3 refills | Status: DC

## 2016-12-28 MED ORDER — WARFARIN SODIUM 5 MG PO TABS: ORAL_TABLET | ORAL | 1 refills | Status: DC

## 2016-12-28 MED ORDER — DIPYRIDAMOLE 50 MG PO TABS: 50.0000 mg | ORAL_TABLET | Freq: Three times a day (TID) | ORAL | 3 refills | Status: DC

## 2016-12-28 NOTE — Patient Instructions (Signed)
Medication Instructions:  Decrease Diltiazem to 240 mg daily.  Labwork: None  Testing/Procedures: None  Follow-Up: Your physician wants you to follow-up in: 6 months with Dr End. (April 2019). You will receive a reminder letter in the mail two months in advance. If you don't receive a letter, please call our office to schedule the follow-up appointment.        If you need a refill on your cardiac medications before your next appointment, please call your pharmacy.

## 2016-12-28 NOTE — Progress Notes (Signed)
Follow-up Outpatient Visit Date: 12/28/2016  Primary Care Provider: Cassandria Anger, MD [place] [id number]  Chief Complaint: Follow-up atrial fibrillation and mitral valve replacement  HPI:  Anthony Gould is a 83 y.o. year-old male with history of severe mitral regurgitation status post mechanical mitral valve replacement, permanent atrial fibrillation, hypertension, hyperlipidemia, TIA on warfarin (now on warfarin, aspirin, and dipyridamole), who presents for follow-up of atrial fibrillation and mitral valve replacement. When I last saw him in June, Anthony Gould noted some intermittent orthostatic lightheadedness. Due to relatively slow heart rates, we agreed to decrease diltiazem and also furosemide. Anthony Gould reports that he has felt better with these medication changes. Interestingly, his blood pressure has also declined. He is also now only taking enalapril 20 mg once daily. He has remained active without limitations. He denies chest pain, shortness of breath, palpitations, orthopnea, PND, and edema. No focal neurologic deficits.  --------------------------------------------------------------------------------------------------  1. Atrial fibrillation: Permanent, on coumadin. He has not been able to tolerate metoprolol due to severe fatigue.  2. DM2: diet-controlled 3. HTN 4. Hyperlipidemia 5. History of TIA: Multiple TIAs while on coumadin. Patient was started on dipyridamole years ago for this.  6. 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 Echo (6/13) with EF 50-55%, mild LVH, mildly dilated LV, mechanical mitral valve functioning normally, biatrial enlargement, PA systolic pressure 38 mmHg. Echo (11/14) with EF 50%, mild diffuse hypokinesis, normal mechanical mitral valve, moderately dilated RV with normal systolic function. Echo (3/16) with mild LVH, EF 50-55%, mechanical mitral valve with mean gradient 5 mmHg,  moderately dilated RV with normal systolic function, severe LAE, moderate TR, PASP 46 mmHg.  7. ETT-myoview (12/11): EF 46%, no evidence for ischemia or infarction, there were runs of NSVT reported. ETT-Cardiolite (11/14): not gated, 5:02 exercise, no ischemia or infarction by perfusion images but had multiple runs of NSVT.  8. Diastolic CHF.  9. Gout 10. NSVT: ETT-Cardiolite in 11/14 with runs of NSVT. Holter monitor (11/14) showed predominant atrial fibrillation with some PVCs, he had a few run of 3 beats NSVT.  11. Cirrhosis: Noted by CT. ?NASH versus ETOH.   Recent CV Pertinent Labs: Lab Results  Component Value Date   CHOL 132 02/06/2016   HDL 48.80 02/06/2016   LDLCALC 68 02/06/2016   TRIG 79.0 02/06/2016   CHOLHDL 3 02/06/2016   INR 2.5 12/28/2016   INR 2.5 (H) 09/20/2015   INR 3.3 06/06/2010   K 4.9 10/17/2016   MG 2.0 09/20/2015   BUN 45 (H) 10/17/2016   CREATININE 1.56 (H) 10/17/2016   CREATININE 1.40 (H) 04/01/2015    Past medical and surgical history were reviewed and updated in EPIC.  Current Meds  Medication Sig  . allopurinol (ZYLOPRIM) 300 MG tablet Take 1 tablet (300 mg total) by mouth daily as needed.  [name] aspirin 81 MG EC tablet Take 81 mg by mouth daily.    [name] atorvastatin (LIPITOR) 20 MG tablet Take 1 tablet (20 mg total) by mouth 2 (two) times a week.  . bisoprolol (ZEBETA) 5 MG tablet TAKE 1/2 TABLET BY MOUTH DAILY  . celecoxib (CELEBREX) 200 MG capsule Take 1 capsule (200 mg total) by mouth 2 (two) times daily.  [name] diltiazem (CARDIZEM CD) 300 MG 24 hr capsule Take 1 capsule (300 mg total) by mouth daily.  [name] dipyridamole (PERSANTINE) 50 MG tablet Take 1 tablet (50 mg total) by mouth 3 (three) times daily.  . enalapril (VASOTEC) 20 MG tablet  Take 1 tablet (20 mg total) by mouth 2 (two) times daily.  . furosemide (LASIX) 40 MG tablet Take 1 tablet (40 mg total) by mouth daily.  [name] latanoprost (XALATAN) 0.005 % ophthalmic solution Place 1 drop into both eyes at  bedtime.  [name] levothyroxine (SYNTHROID, LEVOTHROID) 25 MCG tablet Take 1 tablet (25 mcg total) by mouth daily before breakfast.  . Milk Thistle 1000 MG CAPS Take 1 capsule by mouth daily.  . pantoprazole (PROTONIX) 40 MG tablet Take 1 tablet (40 mg total) by mouth daily.  . promethazine (PHENERGAN) 25 MG tablet Take 1 tablet (25 mg total) by mouth every 8 (eight) hours as needed for nausea or vomiting.  . warfarin (COUMADIN) 5 MG tablet Take as directed by the Anticoagulation Clinic    Allergies: Sulfonamide derivatives  Social History   Social History  . Marital status: Married    Spouse name: [name]  . Number of children: 2  . Years of education: N/A   Occupational History  . 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 Smoking status: Never Smoker  . Smokeless tobacco: Never Used  . Alcohol use No  . Drug use: No  . Sexual activity: Not on file   Other Topics Concern  . Not on file   Social History Narrative   HSG, many management courses   Married '58   2 sons - '61, '62; 1 grandchild   Nonsmoker          Family History  Problem Relation Age of Onset  . Coronary artery disease Father   . Diabetes Other   . Coronary artery disease Other   . Prostate cancer Neg Hx   . Colon cancer Neg Hx     Review of Systems: A 12-system review of systems was performed and was negative except as noted in the HPI.  --------------------------------------------------------------------------------------------------  Physical Exam: BP 116/64   Pulse 64   Ht 5\' 10"  (1.778 m)   Wt 173 lb (78.5 kg)   SpO2 97%   BMI 24.82 kg/m   General:  Well-developed, well-nourished man, seated comfortably in the exam room. HEENT: No conjunctival pallor or scleral icterus. Moist mucous membranes.  OP clear. Neck: Supple without lymphadenopathy, thyromegaly, JVD, or HJR. No carotid bruit. Lungs: Normal work of breathing. Clear to auscultation bilaterally without wheezes or  crackles. Heart: Irregularly irregular with a candidate: S1. No murmurs or rubs. Nondisplaced PMI. Abd: Bowel sounds present. Soft, NT/ND without hepatosplenomegaly Ext: No lower extremity edema. Radial, PT, and DP pulses are 2+ bilaterally. Skin: Warm and dry without rash.  EKG:  Atrial fibrillation (ventricular rate 64 bpm) with PVC versus aberrancy, incomplete right bundle branch block and poor R-wave progression.  Lab Results  Component Value Date   WBC 6.8 10/17/2016   HGB 14.1 10/17/2016   HCT 41.9 10/17/2016   MCV 98.5 10/17/2016   PLT 231.0 10/17/2016    Lab Results  Component Value Date   NA 138 10/17/2016   K 4.9 10/17/2016   CL 106 10/17/2016   CO2 27 10/17/2016   BUN 45 (H) 10/17/2016   CREATININE 1.56 (H) 10/17/2016   GLUCOSE 125 (H) 10/17/2016   ALT 15 05/11/2016    Lab Results  Component Value Date   CHOL 132 02/06/2016   HDL 48.80 02/06/2016   LDLCALC 68 02/06/2016   TRIG 79.0 02/06/2016   CHOLHDL 3 02/06/2016    --------------------------------------------------------------------------------------------------  ASSESSMENT AND PLAN: Permanent atrial fibrillation Heart rate  today remains low-normal. It has been even lower at times at home based on Anthony Gould's readings that he brings with him today. We have agreed to decrease diltiazem further to 240 mg daily. We will continue current dose of bisoprolol as well as indefinite anticoagulation with warfarin, given his history of mechanical mitral valve replacement.  History of mitral valve replacement No evidence of valve dysfunction. Anthony Gould is tolerating warfarin, aspirin, and dipyridamole well, which we will continue with indefinitely. He also appears euvolemic and well compensated. We will continue his current dose of furosemide.  Hypertension Blood pressure is well controlled today. I think it is fine for Anthony Gould to continue with enalapril 20 mg daily, as he has been doing for the last  several weeks. We will not make any other medication changes today.  Follow-up: Return to clinic in 6 months.  Nelva Bush, MD 12/28/2016 3:24 PM

## 2017-01-14 ENCOUNTER — Ambulatory Visit (INDEPENDENT_AMBULATORY_CARE_PROVIDER_SITE_OTHER): Payer: Medicare Other

## 2017-01-14 DIAGNOSIS — Z23 Encounter for immunization: Secondary | ICD-10-CM

## 2017-01-18 ENCOUNTER — Ambulatory Visit (INDEPENDENT_AMBULATORY_CARE_PROVIDER_SITE_OTHER): Payer: Medicare Other | Admitting: *Deleted

## 2017-01-18 DIAGNOSIS — Z7901 Long term (current) use of anticoagulants: Secondary | ICD-10-CM | POA: Diagnosis not present

## 2017-01-18 DIAGNOSIS — I480 Paroxysmal atrial fibrillation: Secondary | ICD-10-CM | POA: Diagnosis not present

## 2017-01-18 DIAGNOSIS — I059 Rheumatic mitral valve disease, unspecified: Secondary | ICD-10-CM

## 2017-01-18 DIAGNOSIS — I4821 Permanent atrial fibrillation: Secondary | ICD-10-CM

## 2017-01-18 DIAGNOSIS — I6350 Cerebral infarction due to unspecified occlusion or stenosis of unspecified cerebral artery: Secondary | ICD-10-CM

## 2017-01-18 DIAGNOSIS — Z5181 Encounter for therapeutic drug level monitoring: Secondary | ICD-10-CM | POA: Diagnosis not present

## 2017-01-18 DIAGNOSIS — Z9889 Other specified postprocedural states: Secondary | ICD-10-CM

## 2017-01-18 DIAGNOSIS — G458 Other transient cerebral ischemic attacks and related syndromes: Secondary | ICD-10-CM | POA: Diagnosis not present

## 2017-01-18 DIAGNOSIS — I482 Chronic atrial fibrillation: Secondary | ICD-10-CM

## 2017-01-18 LAB — POCT INR: INR: 3.3

## 2017-01-21 ENCOUNTER — Encounter: Payer: Self-pay | Admitting: Nurse Practitioner

## 2017-01-21 ENCOUNTER — Ambulatory Visit (INDEPENDENT_AMBULATORY_CARE_PROVIDER_SITE_OTHER): Payer: Medicare Other | Admitting: Nurse Practitioner

## 2017-01-21 VITALS — BP 132/64 | HR 63 | Temp 97.5°F | Ht 70.0 in | Wt 174.0 lb

## 2017-01-21 DIAGNOSIS — B001 Herpesviral vesicular dermatitis: Secondary | ICD-10-CM | POA: Diagnosis not present

## 2017-01-21 MED ORDER — VALACYCLOVIR HCL 1 G PO TABS: 1000.0000 mg | ORAL_TABLET | Freq: Two times a day (BID) | ORAL | 0 refills | Status: DC

## 2017-01-21 NOTE — Patient Instructions (Addendum)
Cold Sore A cold sore, also called a fever blister, is a skin infection that is caused by a virus. This infection causes small, fluid-filled sores to form inside of the mouth or on the lips, gums, nose, chin, or cheeks. Cold sores can spread to other parts of the body, such as the eyes or fingers. Cold sores can be spread or passed from person to person (contagious) until the sores crust over completely. Cold sores can be spread through close contact, such as kissing or sharing a drinking glass. Follow these instructions at home: Medicines  Take or apply over-the-counter and prescription medicines only as told by your doctor.  Use a cotton-tip swab to apply creams or gels to your sores. Sore Care  Do not touch the sores or pick the scabs.  Wash your hands often. Do not touch your eyes without washing your hands first.  Keep the sores clean and dry.  If directed, apply ice to the sores:  Put ice in a plastic bag.  Place a towel between your skin and the bag.  Leave the ice on for 20 minutes, 2-3 times per day. Lifestyle  Do not kiss, have oral sex, or share personal items until your sores heal.  Eat a soft, bland diet. Avoid eating hot, cold, or salty foods. These can hurt your mouth.  Use a straw if it hurts to drink out of a glass.  Avoid the sun and limit your stress if these things trigger outbreaks. If sun causes cold sores, apply sunscreen on your lips before being out in the sun. Contact a doctor if:  You have symptoms for more than two weeks.  You have pus coming from the sores.  You have redness that is spreading.  You have pain or irritation in your eye.  You get sores on your genitals.  Your sores do not heal within two weeks.  You get cold sores often. Get help right away if:  You have a fever and your symptoms suddenly get worse.  You have a headache and confusion. This information is not intended to replace advice given to you by your health care  provider. Make sure you discuss any questions you have with your health care provider. Document Released: 09/11/2011 Document Revised: 08/18/2015 Document Reviewed: 12/31/2014 Elsevier Interactive Patient Education  2018 Elsevier Inc.  

## 2017-01-21 NOTE — Progress Notes (Signed)
Subjective:  Patient ID: Anthony Gould, male    DOB: 1933-12-16  Age: 83 y.o. MRN: [id number]  CC: Mouth Lesions (cold sore on lower lip, going on for 1 wk: how to take care of it,treat it at home,shingle inj can cause this?)   Mouth Lesions   The current episode started more than 1 week ago. The onset was gradual. The problem has been gradually improving. Nothing aggravates the symptoms. Associated symptoms include mouth sores. Pertinent negatives include no fever, no sore throat, no stridor, no swollen glands, no muscle aches, no neck stiffness and no URI. He has been eating and drinking normally. There were no sick contacts. He has received no recent medical care. Services performed: use of abreeva OTC with minimal improvement.   Outpatient Medications Prior to Visit  Medication Sig Dispense Refill  . allopurinol (ZYLOPRIM) 300 MG tablet Take 1 tablet (300 mg total) by mouth daily as needed. 90 tablet 2  . aspirin 81 MG EC tablet Take 81 mg by mouth daily.      [name] atorvastatin (LIPITOR) 20 MG tablet Take 1 tablet (20 mg total) by mouth 2 (two) times a week. 30 tablet 11  . bisoprolol (ZEBETA) 5 MG tablet TAKE 1/2 TABLET BY MOUTH DAILY 45 tablet 1  . celecoxib (CELEBREX) 200 MG capsule Take 1 capsule (200 mg total) by mouth 2 (two) times daily. 30 capsule 0  . diltiazem (CARDIZEM CD) 240 MG 24 hr capsule Take 1 capsule (240 mg total) by mouth daily. 90 capsule 3  . dipyridamole (PERSANTINE) 50 MG tablet Take 1 tablet (50 mg total) by mouth 3 (three) times daily. 270 tablet 3  . enalapril (VASOTEC) 20 MG tablet Take 1 tablet (20 mg total) by mouth daily. 90 tablet 3  . furosemide (LASIX) 40 MG tablet Take 1 tablet (40 mg total) by mouth daily.    [name] latanoprost (XALATAN) 0.005 % ophthalmic solution Place 1 drop into both eyes at bedtime.    . Milk Thistle 1000 MG CAPS Take 1 capsule by mouth daily.    . pantoprazole (PROTONIX) 40 MG tablet Take 1 tablet (40 mg total) by mouth daily. 90  tablet 3  . promethazine (PHENERGAN) 25 MG tablet Take 1 tablet (25 mg total) by mouth every 8 (eight) hours as needed for nausea or vomiting. 20 tablet 1  . warfarin (COUMADIN) 5 MG tablet Take as directed by the Anticoagulation Clinic 135 tablet 1  . levothyroxine (SYNTHROID, LEVOTHROID) 25 MCG tablet Take 1 tablet (25 mcg total) by mouth daily before breakfast. (Patient not taking: Reported on 01/21/2017) 30 tablet 11   No facility-administered medications prior to visit.     ROS See HPI  Objective:  BP 132/64   Pulse 63   Temp (!) 97.5 F (36.4 C)   Ht 5\' 10"  (1.778 m)   Wt 174 lb (78.9 kg)   SpO2 98%   BMI 24.97 kg/m   BP Readings from Last 3 Encounters:  01/21/17 132/64  12/28/16 116/64  10/17/16 (!) 152/80    Wt Readings from Last 3 Encounters:  01/21/17 174 lb (78.9 kg)  12/28/16 173 lb (78.5 kg)  10/17/16 171 lb (77.6 kg)    Physical Exam  Constitutional: He is oriented to person, place, and time.  HENT:  Right Ear: External ear normal.  Left Ear: External ear normal.  Nose: Nose normal.  Mouth/Throat: Oropharynx is clear and moist. No trismus in the jaw. No oropharyngeal exudate or posterior oropharyngeal  erythema.    Eyes: Pupils are equal, round, and reactive to light. Conjunctivae and EOM are normal.  Neck: Normal range of motion. Neck supple.  Lymphadenopathy:    He has no cervical adenopathy.  Neurological: He is alert and oriented to person, place, and time.    Lab Results  Component Value Date   WBC 6.8 10/17/2016   HGB 14.1 10/17/2016   HCT 41.9 10/17/2016   PLT 231.0 10/17/2016   GLUCOSE 125 (H) 10/17/2016   CHOL 132 02/06/2016   TRIG 79.0 02/06/2016   HDL 48.80 02/06/2016   LDLCALC 68 02/06/2016   ALT 15 05/11/2016   AST 23 05/11/2016   NA 138 10/17/2016   K 4.9 10/17/2016   CL 106 10/17/2016   CREATININE 1.56 (H) 10/17/2016   BUN 45 (H) 10/17/2016   CO2 27 10/17/2016   TSH 9.46 (H) 10/17/2016   PSA 0.87 10/20/2014   INR 3.3  01/18/2017   HGBA1C 6.0 10/17/2016    Dg Chest 2 View  Result Date: 11/14/2015 CLINICAL DATA:  Followup pneumonia. History of mitral valve replacement, hypertension, diabetes EXAM: CHEST  2 VIEW COMPARISON:  09/02/2015 FINDINGS: Previously seen right upper lobe pneumonia has resolved. No confluent opacities. No effusions. Heart is normal size. Prior valve replacement. No acute bony abnormality. IMPRESSION: Interval resolution of the previously seen right upper lobe pneumonia. Cardiomegaly. No acute findings. Electronically Signed   By: Rolm Baptise M.D.   On: 11/14/2015 10:19    Assessment & Plan:   Anthony Gould was seen today for mouth lesions.  Diagnoses and all orders for this visit:  Herpes labialis -     valACYclovir (VALTREX) 1000 MG tablet; Take 1 tablet (1,000 mg total) by mouth 2 (two) times daily.   I am having Anthony Gould start on valACYclovir. I am also having him maintain his aspirin, latanoprost, promethazine, atorvastatin, pantoprazole, bisoprolol, allopurinol, Milk Thistle, furosemide, celecoxib, levothyroxine, diltiazem, dipyridamole, enalapril, and warfarin.  Meds ordered this encounter  Medications  . valACYclovir (VALTREX) 1000 MG tablet    Sig: Take 1 tablet (1,000 mg total) by mouth 2 (two) times daily.    Dispense:  14 tablet    Refill:  0    Order Specific Question:   Supervising Provider    Answer:   Cassandria Anger [[id number]]    Follow-up: No Follow-up on file.  Wilfred Lacy, NP

## 2017-02-18 ENCOUNTER — Ambulatory Visit (INDEPENDENT_AMBULATORY_CARE_PROVIDER_SITE_OTHER): Payer: Medicare Other | Admitting: *Deleted

## 2017-02-18 DIAGNOSIS — Z7901 Long term (current) use of anticoagulants: Secondary | ICD-10-CM

## 2017-02-18 DIAGNOSIS — I059 Rheumatic mitral valve disease, unspecified: Secondary | ICD-10-CM

## 2017-02-18 DIAGNOSIS — I6350 Cerebral infarction due to unspecified occlusion or stenosis of unspecified cerebral artery: Secondary | ICD-10-CM

## 2017-02-18 DIAGNOSIS — I482 Chronic atrial fibrillation: Secondary | ICD-10-CM

## 2017-02-18 DIAGNOSIS — G458 Other transient cerebral ischemic attacks and related syndromes: Secondary | ICD-10-CM

## 2017-02-18 DIAGNOSIS — Z9889 Other specified postprocedural states: Secondary | ICD-10-CM

## 2017-02-18 DIAGNOSIS — I4821 Permanent atrial fibrillation: Secondary | ICD-10-CM

## 2017-02-18 LAB — POCT INR: INR: 3.2

## 2017-02-18 NOTE — Patient Instructions (Signed)
Continue  same dosage 5mg  daily except 7.5mg  every Mondays.  Recheck in 4 weeks.

## 2017-03-06 ENCOUNTER — Telehealth: Payer: Self-pay

## 2017-03-06 MED ORDER — PANTOPRAZOLE SODIUM 40 MG PO TBEC: 40.0000 mg | DELAYED_RELEASE_TABLET | Freq: Every day | ORAL | 3 refills | Status: DC

## 2017-03-06 NOTE — Telephone Encounter (Signed)
[facility] sent rf rq for pantoprazole. erx sent for 6 month supply

## 2017-03-07 ENCOUNTER — Other Ambulatory Visit: Payer: Self-pay | Admitting: *Deleted

## 2017-03-07 MED ORDER — BISOPROLOL FUMARATE 5 MG PO TABS: 2.5000 mg | ORAL_TABLET | Freq: Every day | ORAL | 2 refills | Status: DC

## 2017-03-13 ENCOUNTER — Telehealth: Payer: Self-pay | Admitting: Internal Medicine

## 2017-03-13 ENCOUNTER — Other Ambulatory Visit: Payer: Self-pay

## 2017-03-13 MED ORDER — ATORVASTATIN CALCIUM 20 MG PO TABS: 20.0000 mg | ORAL_TABLET | ORAL | 2 refills | Status: DC

## 2017-03-13 NOTE — Telephone Encounter (Signed)
Copied from [place] [id number]. Topic: General - Other >> Mar 13, 2017  1:48 PM Darl Householder, RMA wrote: Reason for CRM: [facility] is requesting a medication refill for atorvastatin 20 mg to be sent to pharmacy

## 2017-03-20 ENCOUNTER — Other Ambulatory Visit: Payer: Self-pay | Admitting: Internal Medicine

## 2017-03-20 ENCOUNTER — Ambulatory Visit (INDEPENDENT_AMBULATORY_CARE_PROVIDER_SITE_OTHER): Payer: Medicare Other

## 2017-03-20 DIAGNOSIS — I482 Chronic atrial fibrillation: Secondary | ICD-10-CM

## 2017-03-20 DIAGNOSIS — I059 Rheumatic mitral valve disease, unspecified: Secondary | ICD-10-CM

## 2017-03-20 DIAGNOSIS — G458 Other transient cerebral ischemic attacks and related syndromes: Secondary | ICD-10-CM | POA: Diagnosis not present

## 2017-03-20 DIAGNOSIS — Z9889 Other specified postprocedural states: Secondary | ICD-10-CM | POA: Diagnosis not present

## 2017-03-20 DIAGNOSIS — I4821 Permanent atrial fibrillation: Secondary | ICD-10-CM

## 2017-03-20 DIAGNOSIS — I6350 Cerebral infarction due to unspecified occlusion or stenosis of unspecified cerebral artery: Secondary | ICD-10-CM | POA: Diagnosis not present

## 2017-03-20 DIAGNOSIS — Z7901 Long term (current) use of anticoagulants: Secondary | ICD-10-CM

## 2017-03-20 LAB — POCT INR: INR: 3

## 2017-03-20 NOTE — Patient Instructions (Signed)
Continue  same dosage 5mg  daily except 7.5mg  every Mondays.  Recheck in 6 weeks.

## 2017-03-25 ENCOUNTER — Other Ambulatory Visit: Payer: Self-pay | Admitting: Internal Medicine

## 2017-04-22 ENCOUNTER — Other Ambulatory Visit (INDEPENDENT_AMBULATORY_CARE_PROVIDER_SITE_OTHER): Payer: Medicare Other

## 2017-04-22 ENCOUNTER — Ambulatory Visit (INDEPENDENT_AMBULATORY_CARE_PROVIDER_SITE_OTHER): Payer: Medicare Other | Admitting: Internal Medicine

## 2017-04-22 ENCOUNTER — Other Ambulatory Visit: Payer: Medicare Other

## 2017-04-22 ENCOUNTER — Encounter: Payer: Self-pay | Admitting: Internal Medicine

## 2017-04-22 DIAGNOSIS — I10 Essential (primary) hypertension: Secondary | ICD-10-CM | POA: Diagnosis not present

## 2017-04-22 DIAGNOSIS — E782 Mixed hyperlipidemia: Secondary | ICD-10-CM | POA: Diagnosis not present

## 2017-04-22 DIAGNOSIS — K7460 Unspecified cirrhosis of liver: Secondary | ICD-10-CM | POA: Diagnosis not present

## 2017-04-22 DIAGNOSIS — I480 Paroxysmal atrial fibrillation: Secondary | ICD-10-CM

## 2017-04-22 DIAGNOSIS — I4821 Permanent atrial fibrillation: Secondary | ICD-10-CM

## 2017-04-22 DIAGNOSIS — E1159 Type 2 diabetes mellitus with other circulatory complications: Secondary | ICD-10-CM

## 2017-04-22 DIAGNOSIS — I482 Chronic atrial fibrillation: Secondary | ICD-10-CM

## 2017-04-22 LAB — HEPATIC FUNCTION PANEL
ALBUMIN: 4.5 g/dL (ref 3.5–5.2)
ALK PHOS: 87 U/L (ref 39–117)
ALT: 15 U/L (ref 0–53)
AST: 25 U/L (ref 0–37)
BILIRUBIN DIRECT: 0.4 mg/dL — AB (ref 0.0–0.3)
TOTAL PROTEIN: 7.7 g/dL (ref 6.0–8.3)
Total Bilirubin: 1.5 mg/dL — ABNORMAL HIGH (ref 0.2–1.2)

## 2017-04-22 LAB — LIPID PANEL
CHOLESTEROL: 140 mg/dL (ref 0–200)
HDL: 48.5 mg/dL (ref >39.00)
LDL CALC: 69 mg/dL (ref 0–99)
NonHDL: 91.19
Total CHOL/HDL Ratio: 3
Triglycerides: 113 mg/dL (ref 0.0–149.0)
VLDL: 22.6 mg/dL (ref 0.0–40.0)

## 2017-04-22 LAB — BASIC METABOLIC PANEL
BUN: 28 mg/dL — AB (ref 6–23)
CALCIUM: 9.8 mg/dL (ref 8.4–10.5)
CO2: 28 mEq/L (ref 19–32)
CREATININE: 1.24 mg/dL (ref 0.40–1.50)
Chloride: 102 mEq/L (ref 96–112)
GFR: 59.35 mL/min — AB (ref >60.00)
Glucose, Bld: 119 mg/dL — ABNORMAL HIGH (ref 70–99)
Potassium: 4.6 mEq/L (ref 3.5–5.1)
Sodium: 139 mEq/L (ref 135–145)

## 2017-04-22 LAB — CBC WITH DIFFERENTIAL/PLATELET
BASOS ABS: 0.1 10*3/uL (ref 0.0–0.1)
BASOS PCT: 0.8 % (ref 0.0–3.0)
EOS ABS: 0.4 10*3/uL (ref 0.0–0.7)
Eosinophils Relative: 6.5 % — ABNORMAL HIGH (ref 0.0–5.0)
HEMATOCRIT: 40.3 % (ref 39.0–52.0)
HEMOGLOBIN: 13.7 g/dL (ref 13.0–17.0)
LYMPHS PCT: 16.2 % (ref 12.0–46.0)
Lymphs Abs: 1.1 10*3/uL (ref 0.7–4.0)
MCHC: 33.9 g/dL (ref 30.0–36.0)
MCV: 100.2 fl — AB (ref 78.0–100.0)
MONOS PCT: 6.9 % (ref 3.0–12.0)
Monocytes Absolute: 0.5 10*3/uL (ref 0.1–1.0)
NEUTROS ABS: 4.6 10*3/uL (ref 1.4–7.7)
Neutrophils Relative %: 69.6 % (ref 43.0–77.0)
Platelets: 210 10*3/uL (ref 150.0–400.0)
RBC: 4.02 Mil/uL — ABNORMAL LOW (ref 4.22–5.81)
RDW: 15.1 % (ref 11.5–15.5)
WBC: 6.6 10*3/uL (ref 4.0–10.5)

## 2017-04-22 LAB — HEMOGLOBIN A1C: Hgb A1c MFr Bld: 5.8 % (ref 4.6–6.5)

## 2017-04-22 LAB — T3, FREE: T3, Free: 3.6 pg/mL (ref 2.3–4.2)

## 2017-04-22 LAB — T4, FREE: FREE T4: 0.92 ng/dL (ref 0.60–1.60)

## 2017-04-22 LAB — TSH: TSH: 9.81 u[IU]/mL — AB (ref 0.35–4.50)

## 2017-04-22 NOTE — Progress Notes (Signed)
Subjective:  Patient ID: Anthony Gould, male    DOB: 1934/02/18  Age: 84 y.o. MRN: [id number]  CC: No chief complaint on file.   HPI Anthony Gould presents for HTN w/SBP 120-148 F/u dyslipidemia on Lipitor F/u abd pain - no relapse since Nov  Outpatient Medications Prior to Visit  Medication Sig Dispense Refill  . allopurinol (ZYLOPRIM) 300 MG tablet Take 1 tablet (300 mg total) by mouth daily as needed. 90 tablet 2  . aspirin 81 MG EC tablet Take 81 mg by mouth daily.      [name] atorvastatin (LIPITOR) 20 MG tablet Take 1 tablet (20 mg total) by mouth 2 (two) times a week. 30 tablet 2  . bisoprolol (ZEBETA) 5 MG tablet Take 0.5 tablets (2.5 mg total) by mouth daily. 45 tablet 2  . celecoxib (CELEBREX) 200 MG capsule Take 1 capsule (200 mg total) by mouth 2 (two) times daily. 30 capsule 0  . dipyridamole (PERSANTINE) 50 MG tablet Take 1 tablet (50 mg total) by mouth 3 (three) times daily. 270 tablet 3  . enalapril (VASOTEC) 20 MG tablet Take 1 tablet (20 mg total) by mouth daily. 90 tablet 3  . furosemide (LASIX) 40 MG tablet Take 1 tablet (40 mg total) by mouth daily.    [name] latanoprost (XALATAN) 0.005 % ophthalmic solution Place 1 drop into both eyes at bedtime.    . Milk Thistle 1000 MG CAPS Take 1 capsule by mouth daily.    . pantoprazole (PROTONIX) 40 MG tablet Take 1 tablet (40 mg total) by mouth daily. 90 tablet 3  . promethazine (PHENERGAN) 25 MG tablet Take 1 tablet (25 mg total) by mouth every 8 (eight) hours as needed for nausea or vomiting. 20 tablet 1  . warfarin (COUMADIN) 5 MG tablet Take as directed by the Anticoagulation Clinic 135 tablet 1  . diltiazem (CARDIZEM CD) 240 MG 24 hr capsule Take 1 capsule (240 mg total) by mouth daily. 90 capsule 3  . levothyroxine (SYNTHROID, LEVOTHROID) 25 MCG tablet Take 1 tablet (25 mcg total) by mouth daily before breakfast. (Patient not taking: Reported on 01/21/2017) 30 tablet 11  . valACYclovir (VALTREX) 1000 MG tablet Take 1  tablet (1,000 mg total) by mouth 2 (two) times daily. 14 tablet 0   No facility-administered medications prior to visit.     ROS Review of Systems  Constitutional: Negative for appetite change, fatigue and unexpected weight change.  HENT: Negative for congestion, nosebleeds, sneezing, sore throat and trouble swallowing.   Eyes: Negative for itching and visual disturbance.  Respiratory: Negative for cough.   Cardiovascular: Negative for chest pain, palpitations and leg swelling.  Gastrointestinal: Negative for abdominal distention, blood in stool, diarrhea and nausea.  Genitourinary: Negative for frequency and hematuria.  Musculoskeletal: Negative for back pain, gait problem, joint swelling and neck pain.  Skin: Negative for rash.  Neurological: Negative for dizziness, tremors, speech difficulty and weakness.  Psychiatric/Behavioral: Negative for agitation, dysphoric mood and sleep disturbance. The patient is not nervous/anxious.     Objective:  BP (!) 152/78 (BP Location: Left Arm, Patient Position: Sitting, Cuff Size: Large)   Pulse 78   Temp 97.7 F (36.5 C) (Oral)   Ht 5\' 10"  (1.778 m)   Wt 175 lb (79.4 kg)   SpO2 98%   BMI 25.11 kg/m   BP Readings from Last 3 Encounters:  04/22/17 (!) 152/78  01/21/17 132/64  12/28/16 116/64    Wt Readings from Last 3 Encounters:  04/22/17 175 lb (79.4 kg)  01/21/17 174 lb (78.9 kg)  12/28/16 173 lb (78.5 kg)    Physical Exam  Constitutional: He is oriented to person, place, and time. He appears well-developed. No distress.  NAD  HENT:  Mouth/Throat: Oropharynx is clear and moist.  Eyes: Conjunctivae are normal. Pupils are equal, round, and reactive to light.  Neck: Normal range of motion. No JVD present. No thyromegaly present.  Cardiovascular: Normal rate, regular rhythm, normal heart sounds and intact distal pulses. Exam reveals no gallop and no friction rub.  No murmur heard. Pulmonary/Chest: Effort normal and breath sounds  normal. No respiratory distress. He has no wheezes. He has no rales. He exhibits no tenderness.  Abdominal: Soft. Bowel sounds are normal. He exhibits no distension and no mass. There is no tenderness. There is no rebound and no guarding.  Musculoskeletal: Normal range of motion. He exhibits no edema or tenderness.  Lymphadenopathy:    He has no cervical adenopathy.  Neurological: He is alert and oriented to person, place, and time. He has normal reflexes. No cranial nerve deficit. He exhibits normal muscle tone. He displays a negative Romberg sign. Coordination and gait normal.  Skin: Skin is warm and dry. No rash noted.  Psychiatric: He has a normal mood and affect. His behavior is normal. Judgment and thought content normal.    Lab Results  Component Value Date   WBC 6.8 10/17/2016   HGB 14.1 10/17/2016   HCT 41.9 10/17/2016   PLT 231.0 10/17/2016   GLUCOSE 125 (H) 10/17/2016   CHOL 132 02/06/2016   TRIG 79.0 02/06/2016   HDL 48.80 02/06/2016   LDLCALC 68 02/06/2016   ALT 15 05/11/2016   AST 23 05/11/2016   NA 138 10/17/2016   K 4.9 10/17/2016   CL 106 10/17/2016   CREATININE 1.56 (H) 10/17/2016   BUN 45 (H) 10/17/2016   CO2 27 10/17/2016   TSH 9.46 (H) 10/17/2016   PSA 0.87 10/20/2014   INR 3.0 03/20/2017   HGBA1C 6.0 10/17/2016    Dg Chest 2 View  Result Date: 11/14/2015 CLINICAL DATA:  Followup pneumonia. History of mitral valve replacement, hypertension, diabetes EXAM: CHEST  2 VIEW COMPARISON:  09/02/2015 FINDINGS: Previously seen right upper lobe pneumonia has resolved. No confluent opacities. No effusions. Heart is normal size. Prior valve replacement. No acute bony abnormality. IMPRESSION: Interval resolution of the previously seen right upper lobe pneumonia. Cardiomegaly. No acute findings. Electronically Signed   By: Anthony Gould M.D.   On: 11/14/2015 10:19    Assessment & Plan:   There are no diagnoses linked to this encounter. I have discontinued Anthony Gould.  Anthony "Jim"'s levothyroxine and valACYclovir. I am also having him maintain his aspirin, latanoprost, promethazine, allopurinol, Milk Thistle, furosemide, celecoxib, diltiazem, dipyridamole, enalapril, warfarin, pantoprazole, bisoprolol, and atorvastatin.  No orders of the defined types were placed in this encounter.    Follow-up: No Follow-up on file.  Walker Kehr, MD

## 2017-04-22 NOTE — Assessment & Plan Note (Signed)
Lipitor 

## 2017-04-22 NOTE — Assessment & Plan Note (Signed)
Off ETOH

## 2017-04-22 NOTE — Assessment & Plan Note (Signed)
Coumadin 

## 2017-04-22 NOTE — Assessment & Plan Note (Signed)
Labs

## 2017-04-22 NOTE — Assessment & Plan Note (Signed)
On Diltiazem, Bisoprolol, Enalapril, Furosemide, Coumadin, ASA

## 2017-04-22 NOTE — Assessment & Plan Note (Signed)
On Diltiazem, Bisoprolol, Enalapril, Furosemide  Monitor BP at home

## 2017-04-26 ENCOUNTER — Telehealth: Payer: Self-pay | Admitting: Internal Medicine

## 2017-04-26 DIAGNOSIS — H21541 Posterior synechiae (iris), right eye: Secondary | ICD-10-CM | POA: Diagnosis not present

## 2017-04-26 DIAGNOSIS — H2000 Unspecified acute and subacute iridocyclitis: Secondary | ICD-10-CM | POA: Diagnosis not present

## 2017-04-26 NOTE — Telephone Encounter (Signed)
Pt called with B/P concerns for quit some time  B/p has been normal but noted lately systolic has been running in 150's with occ higher reading diastolic normal.Pt wanting to know if Dr Alain Marion needs to follow  B/p or Dr End.Pt  as well has adjusted meds on own increased Enalapril to 20 mg bid for 2 days , Bisoprolol  To 5 mg  For 2 days and Diltiazem 360 mg yesterday .Discussed with Dr End have pt follow up with Dr Alain Marion re B/p the patient aware of recommendations ./cy

## 2017-04-26 NOTE — Telephone Encounter (Signed)
Please call and schedule pt for monday

## 2017-04-26 NOTE — Telephone Encounter (Signed)
Copied from [place]. Topic: Inquiry >> Apr 26, 2017 10:14 AM Scherrie Gerlach wrote: Reason for CRM: pt saw Dr Alain Marion Monday 04/22/17 and states he was to let Dr Alain Marion know if his bp remained high. Pt states his bp top number has been consistent 150-170.  The bottom number is fine. Pt would like to know what he should do? No appts until thurs 2/07 and he said that is too far out.  He needs to know something or see Dr Alain Marion before then Please advise

## 2017-04-26 NOTE — Telephone Encounter (Signed)
Pt c/o BP issue: STAT if pt c/o blurred vision, one-sided weakness or slurred speech  1. What are your last 5 BP readings? 150/50 2. Are you having any other symptoms (ex. Dizziness, headache, blurred vision, passed out)? no 3. What is your BP issue? high

## 2017-04-26 NOTE — Telephone Encounter (Signed)
Appointment scheduled.

## 2017-04-29 ENCOUNTER — Ambulatory Visit: Payer: Medicare Other | Admitting: Internal Medicine

## 2017-04-29 ENCOUNTER — Encounter: Payer: Self-pay | Admitting: Internal Medicine

## 2017-04-29 ENCOUNTER — Ambulatory Visit: Payer: Medicare Other | Admitting: *Deleted

## 2017-04-29 DIAGNOSIS — I482 Chronic atrial fibrillation: Secondary | ICD-10-CM | POA: Diagnosis not present

## 2017-04-29 DIAGNOSIS — I059 Rheumatic mitral valve disease, unspecified: Secondary | ICD-10-CM | POA: Diagnosis not present

## 2017-04-29 DIAGNOSIS — I480 Paroxysmal atrial fibrillation: Secondary | ICD-10-CM | POA: Diagnosis not present

## 2017-04-29 DIAGNOSIS — I10 Essential (primary) hypertension: Secondary | ICD-10-CM

## 2017-04-29 DIAGNOSIS — Z7901 Long term (current) use of anticoagulants: Secondary | ICD-10-CM

## 2017-04-29 DIAGNOSIS — I4821 Permanent atrial fibrillation: Secondary | ICD-10-CM

## 2017-04-29 DIAGNOSIS — E1159 Type 2 diabetes mellitus with other circulatory complications: Secondary | ICD-10-CM

## 2017-04-29 DIAGNOSIS — Z9889 Other specified postprocedural states: Secondary | ICD-10-CM

## 2017-04-29 DIAGNOSIS — I6350 Cerebral infarction due to unspecified occlusion or stenosis of unspecified cerebral artery: Secondary | ICD-10-CM | POA: Diagnosis not present

## 2017-04-29 DIAGNOSIS — G458 Other transient cerebral ischemic attacks and related syndromes: Secondary | ICD-10-CM

## 2017-04-29 DIAGNOSIS — E782 Mixed hyperlipidemia: Secondary | ICD-10-CM

## 2017-04-29 LAB — POCT INR: INR: 2.7

## 2017-04-29 MED ORDER — DILTIAZEM HCL ER COATED BEADS 240 MG PO CP24: 240.0000 mg | ORAL_CAPSULE | Freq: Every day | ORAL | 3 refills | Status: DC

## 2017-04-29 MED ORDER — ENALAPRIL MALEATE 20 MG PO TABS: 20.0000 mg | ORAL_TABLET | Freq: Every day | ORAL | 3 refills | Status: DC

## 2017-04-29 MED ORDER — BISOPROLOL FUMARATE 5 MG PO TABS: 5.0000 mg | ORAL_TABLET | Freq: Every day | ORAL | 3 refills | Status: DC

## 2017-04-29 MED ORDER — FUROSEMIDE 40 MG PO TABS: 40.0000 mg | ORAL_TABLET | Freq: Every day | ORAL | 3 refills | Status: DC

## 2017-04-29 NOTE — Assessment & Plan Note (Addendum)
Worse Will increase Bisoprolol to 1 a day NAS diet

## 2017-04-29 NOTE — Assessment & Plan Note (Signed)
Worse Will increase Bisoprolol to 1 a day

## 2017-04-29 NOTE — Assessment & Plan Note (Signed)
Labs

## 2017-04-29 NOTE — Assessment & Plan Note (Signed)
Lipitor 

## 2017-04-29 NOTE — Progress Notes (Signed)
Subjective:  Patient ID: TRES GRZYWACZ, male    DOB: 1933-08-15  Age: 84 y.o. MRN: [id number]  CC: No chief complaint on file.   HPI EDINSON DOMEIER presents for HTN. C/o elevated BP at home - SBP 122-162 over past week.  Outpatient Medications Prior to Visit  Medication Sig Dispense Refill  . allopurinol (ZYLOPRIM) 300 MG tablet Take 1 tablet (300 mg total) by mouth daily as needed. 90 tablet 2  . aspirin 81 MG EC tablet Take 81 mg by mouth daily.      [name] atorvastatin (LIPITOR) 20 MG tablet Take 1 tablet (20 mg total) by mouth 2 (two) times a week. 30 tablet 2  . bisoprolol (ZEBETA) 5 MG tablet Take 0.5 tablets (2.5 mg total) by mouth daily. 45 tablet 2  . celecoxib (CELEBREX) 200 MG capsule Take 1 capsule (200 mg total) by mouth 2 (two) times daily. 30 capsule 0  . dipyridamole (PERSANTINE) 50 MG tablet Take 1 tablet (50 mg total) by mouth 3 (three) times daily. 270 tablet 3  . enalapril (VASOTEC) 20 MG tablet Take 1 tablet (20 mg total) by mouth daily. 90 tablet 3  . furosemide (LASIX) 40 MG tablet Take 1 tablet (40 mg total) by mouth daily.    [name] latanoprost (XALATAN) 0.005 % ophthalmic solution Place 1 drop into both eyes at bedtime.    . LOTEMAX 0.5 % ophthalmic suspension instill 1 drop into right eye three times a day as directed  0  . Milk Thistle 1000 MG CAPS Take 1 capsule by mouth daily.    . pantoprazole (PROTONIX) 40 MG tablet Take 1 tablet (40 mg total) by mouth daily. 90 tablet 3  . promethazine (PHENERGAN) 25 MG tablet Take 1 tablet (25 mg total) by mouth every 8 (eight) hours as needed for nausea or vomiting. 20 tablet 1  . warfarin (COUMADIN) 5 MG tablet Take as directed by the Anticoagulation Clinic 135 tablet 1  . diltiazem (CARDIZEM CD) 240 MG 24 hr capsule Take 1 capsule (240 mg total) by mouth daily. 90 capsule 3   No facility-administered medications prior to visit.     ROS Review of Systems  Constitutional: Negative for appetite change, fatigue and  unexpected weight change.  HENT: Negative for congestion, nosebleeds, sneezing, sore throat and trouble swallowing.   Eyes: Negative for itching and visual disturbance.  Respiratory: Negative for cough.   Cardiovascular: Negative for chest pain, palpitations and leg swelling.  Gastrointestinal: Negative for abdominal distention, blood in stool, diarrhea and nausea.  Genitourinary: Negative for frequency and hematuria.  Musculoskeletal: Negative for back pain, gait problem, joint swelling and neck pain.  Skin: Negative for rash.  Neurological: Negative for dizziness, tremors, speech difficulty and weakness.  Psychiatric/Behavioral: Negative for agitation, dysphoric mood, sleep disturbance and suicidal ideas. The patient is not nervous/anxious.     Objective:  BP (!) 146/72 (BP Location: Left Arm, Patient Position: Sitting, Cuff Size: Normal)   Pulse 77   Temp 98.2 F (36.8 C) (Oral)   Ht 5\' 10"  (1.778 m)   Wt 175 lb (79.4 kg)   SpO2 98%   BMI 25.11 kg/m   BP Readings from Last 3 Encounters:  04/29/17 (!) 146/72  04/22/17 (!) 152/78  01/21/17 132/64    Wt Readings from Last 3 Encounters:  04/29/17 175 lb (79.4 kg)  04/22/17 175 lb (79.4 kg)  01/21/17 174 lb (78.9 kg)    Physical Exam  Constitutional: He is oriented to person,  place, and time. He appears well-developed. No distress.  NAD  HENT:  Mouth/Throat: Oropharynx is clear and moist.  Eyes: Conjunctivae are normal. Pupils are equal, round, and reactive to light.  Neck: Normal range of motion. No JVD present. No thyromegaly present.  Cardiovascular: Normal rate, regular rhythm, normal heart sounds and intact distal pulses. Exam reveals no gallop and no friction rub.  No murmur heard. Pulmonary/Chest: Effort normal and breath sounds normal. No respiratory distress. He has no wheezes. He has no rales. He exhibits no tenderness.  Abdominal: Soft. Bowel sounds are normal. He exhibits no distension and no mass. There is no  tenderness. There is no rebound and no guarding.  Musculoskeletal: Normal range of motion. He exhibits no edema or tenderness.  Lymphadenopathy:    He has no cervical adenopathy.  Neurological: He is alert and oriented to person, place, and time. He has normal reflexes. No cranial nerve deficit. He exhibits normal muscle tone. He displays a negative Romberg sign. Coordination and gait normal.  Skin: Skin is warm and dry. No rash noted.  Psychiatric: He has a normal mood and affect. His behavior is normal. Judgment and thought content normal.    Lab Results  Component Value Date   WBC 6.6 04/22/2017   HGB 13.7 04/22/2017   HCT 40.3 04/22/2017   PLT 210.0 04/22/2017   GLUCOSE 119 (H) 04/22/2017   CHOL 140 04/22/2017   TRIG 113.0 04/22/2017   HDL 48.50 04/22/2017   LDLCALC 69 04/22/2017   ALT 15 04/22/2017   AST 25 04/22/2017   NA 139 04/22/2017   K 4.6 04/22/2017   CL 102 04/22/2017   CREATININE 1.24 04/22/2017   BUN 28 (H) 04/22/2017   CO2 28 04/22/2017   TSH 9.81 (H) 04/22/2017   PSA 0.87 10/20/2014   INR 3.0 03/20/2017   HGBA1C 5.8 04/22/2017    Dg Chest 2 View  Result Date: 11/14/2015 CLINICAL DATA:  Followup pneumonia. History of mitral valve replacement, hypertension, diabetes EXAM: CHEST  2 VIEW COMPARISON:  09/02/2015 FINDINGS: Previously seen right upper lobe pneumonia has resolved. No confluent opacities. No effusions. Heart is normal size. Prior valve replacement. No acute bony abnormality. IMPRESSION: Interval resolution of the previously seen right upper lobe pneumonia. Cardiomegaly. No acute findings. Electronically Signed   By: Rolm Baptise M.D.   On: 11/14/2015 10:19    Assessment & Plan:   There are no diagnoses linked to this encounter. I am having [name]. Struss "[name]" maintain his aspirin, latanoprost, promethazine, allopurinol, Milk Thistle, furosemide, celecoxib, diltiazem, dipyridamole, enalapril, warfarin, pantoprazole, bisoprolol, atorvastatin, and  LOTEMAX.  No orders of the defined types were placed in this encounter.    Follow-up: No Follow-up on file.  Walker Kehr, MD

## 2017-04-29 NOTE — Patient Instructions (Signed)
Description   Today take an extra 2.5mg , then Continue  same dosage 5mg  daily except 7.5mg  every Mondays.  Recheck in 4 weeks.

## 2017-04-29 NOTE — Patient Instructions (Signed)
  We will increase Bisoprolol to 1 a day  BP Readings from Last 3 Encounters:  04/29/17 (!) 146/72  04/22/17 (!) 152/78  01/21/17 132/64

## 2017-05-20 DIAGNOSIS — H35033 Hypertensive retinopathy, bilateral: Secondary | ICD-10-CM | POA: Diagnosis not present

## 2017-05-20 DIAGNOSIS — H2513 Age-related nuclear cataract, bilateral: Secondary | ICD-10-CM | POA: Diagnosis not present

## 2017-05-20 DIAGNOSIS — H3562 Retinal hemorrhage, left eye: Secondary | ICD-10-CM | POA: Diagnosis not present

## 2017-05-27 ENCOUNTER — Ambulatory Visit: Payer: Medicare Other | Admitting: *Deleted

## 2017-05-27 DIAGNOSIS — I6350 Cerebral infarction due to unspecified occlusion or stenosis of unspecified cerebral artery: Secondary | ICD-10-CM | POA: Diagnosis not present

## 2017-05-27 DIAGNOSIS — G458 Other transient cerebral ischemic attacks and related syndromes: Secondary | ICD-10-CM

## 2017-05-27 DIAGNOSIS — Z9889 Other specified postprocedural states: Secondary | ICD-10-CM

## 2017-05-27 DIAGNOSIS — Z7901 Long term (current) use of anticoagulants: Secondary | ICD-10-CM

## 2017-05-27 DIAGNOSIS — I059 Rheumatic mitral valve disease, unspecified: Secondary | ICD-10-CM | POA: Diagnosis not present

## 2017-05-27 DIAGNOSIS — I4821 Permanent atrial fibrillation: Secondary | ICD-10-CM

## 2017-05-27 LAB — POCT INR: INR: 2.7

## 2017-05-27 NOTE — Patient Instructions (Signed)
Description   Today take an extra 2.5mg , then change your dosage to 5mg  daily except 7.5mg  every Mondays and Fridays.  Recheck in 2-3 weeks.

## 2017-06-04 ENCOUNTER — Other Ambulatory Visit: Payer: Self-pay

## 2017-06-04 MED ORDER — ALLOPURINOL 300 MG PO TABS: 300.0000 mg | ORAL_TABLET | Freq: Every day | ORAL | 2 refills | Status: DC | PRN

## 2017-06-12 ENCOUNTER — Encounter: Payer: Self-pay | Admitting: Internal Medicine

## 2017-06-12 ENCOUNTER — Ambulatory Visit: Payer: Medicare Other | Admitting: *Deleted

## 2017-06-12 ENCOUNTER — Ambulatory Visit: Payer: Medicare Other | Admitting: Internal Medicine

## 2017-06-12 DIAGNOSIS — Z9889 Other specified postprocedural states: Secondary | ICD-10-CM | POA: Diagnosis not present

## 2017-06-12 DIAGNOSIS — E1159 Type 2 diabetes mellitus with other circulatory complications: Secondary | ICD-10-CM

## 2017-06-12 DIAGNOSIS — G458 Other transient cerebral ischemic attacks and related syndromes: Secondary | ICD-10-CM | POA: Diagnosis not present

## 2017-06-12 DIAGNOSIS — Z7901 Long term (current) use of anticoagulants: Secondary | ICD-10-CM | POA: Diagnosis not present

## 2017-06-12 DIAGNOSIS — I059 Rheumatic mitral valve disease, unspecified: Secondary | ICD-10-CM | POA: Diagnosis not present

## 2017-06-12 DIAGNOSIS — I10 Essential (primary) hypertension: Secondary | ICD-10-CM

## 2017-06-12 DIAGNOSIS — I2510 Atherosclerotic heart disease of native coronary artery without angina pectoris: Secondary | ICD-10-CM | POA: Diagnosis not present

## 2017-06-12 DIAGNOSIS — I480 Paroxysmal atrial fibrillation: Secondary | ICD-10-CM | POA: Diagnosis not present

## 2017-06-12 DIAGNOSIS — I6350 Cerebral infarction due to unspecified occlusion or stenosis of unspecified cerebral artery: Secondary | ICD-10-CM | POA: Diagnosis not present

## 2017-06-12 DIAGNOSIS — I4821 Permanent atrial fibrillation: Secondary | ICD-10-CM

## 2017-06-12 DIAGNOSIS — Z5181 Encounter for therapeutic drug level monitoring: Secondary | ICD-10-CM

## 2017-06-12 LAB — POCT INR: INR: 3.4

## 2017-06-12 MED ORDER — CELECOXIB 200 MG PO CAPS: 200.0000 mg | ORAL_CAPSULE | Freq: Two times a day (BID) | ORAL | 1 refills | Status: DC

## 2017-06-12 NOTE — Assessment & Plan Note (Signed)
ASA, diltiazem, Lipitor, Bisoprolol  

## 2017-06-12 NOTE — Assessment & Plan Note (Signed)
No relapse 

## 2017-06-12 NOTE — Patient Instructions (Signed)
Description   Continue same dose of coumadin  5mg  daily except 7.5mg  every Mondays and Fridays.  Recheck in 3 weeks.

## 2017-06-12 NOTE — Progress Notes (Signed)
Subjective:  Patient ID: Anthony Gould, male    DOB: [birth year]  Age: 84 y.o. MRN: [id number]  CC: No chief complaint on file.   HPI Anthony Gould presents for HTN, dyslipidemia, gout f/u BP nl at home  Outpatient Medications Prior to Visit  Medication Sig Dispense Refill  . allopurinol (ZYLOPRIM) 300 MG tablet Take 1 tablet (300 mg total) by mouth daily as needed. 90 tablet 2  . aspirin 81 MG EC tablet Take 81 mg by mouth daily.      [name] atorvastatin (LIPITOR) 20 MG tablet Take 1 tablet (20 mg total) by mouth 2 (two) times a week. 30 tablet 2  . bisoprolol (ZEBETA) 5 MG tablet Take 1 tablet (5 mg total) by mouth daily. 90 tablet 3  . celecoxib (CELEBREX) 200 MG capsule Take 1 capsule (200 mg total) by mouth 2 (two) times daily. 30 capsule 0  . diltiazem (CARDIZEM CD) 240 MG 24 hr capsule Take 1 capsule (240 mg total) by mouth daily. 90 capsule 3  . dipyridamole (PERSANTINE) 50 MG tablet Take 1 tablet (50 mg total) by mouth 3 (three) times daily. 270 tablet 3  . enalapril (VASOTEC) 20 MG tablet Take 1 tablet (20 mg total) by mouth daily. 90 tablet 3  . furosemide (LASIX) 40 MG tablet Take 1 tablet (40 mg total) by mouth daily. 90 tablet 3  . latanoprost (XALATAN) 0.005 % ophthalmic solution Place 1 drop into both eyes at bedtime.    . Milk Thistle 1000 MG CAPS Take 1 capsule by mouth daily.    . pantoprazole (PROTONIX) 40 MG tablet Take 1 tablet (40 mg total) by mouth daily. 90 tablet 3  . promethazine (PHENERGAN) 25 MG tablet Take 1 tablet (25 mg total) by mouth every 8 (eight) hours as needed for nausea or vomiting. 20 tablet 1  . warfarin (COUMADIN) 5 MG tablet Take as directed by the Anticoagulation Clinic 135 tablet 1  . LOTEMAX 0.5 % ophthalmic suspension instill 1 drop into right eye three times a day as directed  0   No facility-administered medications prior to visit.     ROS Review of Systems  Constitutional: Negative for appetite change, fatigue and unexpected  weight change.  HENT: Negative for congestion, nosebleeds, sneezing, sore throat and trouble swallowing.   Eyes: Negative for itching and visual disturbance.  Respiratory: Negative for cough.   Cardiovascular: Negative for chest pain, palpitations and leg swelling.  Gastrointestinal: Negative for abdominal distention, blood in stool, diarrhea and nausea.  Genitourinary: Negative for frequency and hematuria.  Musculoskeletal: Positive for arthralgias and back pain. Negative for gait problem, joint swelling and neck pain.  Skin: Negative for rash.  Neurological: Negative for dizziness, tremors, speech difficulty and weakness.  Psychiatric/Behavioral: Negative for agitation, dysphoric mood and sleep disturbance. The patient is not nervous/anxious.     Objective:  BP (!) 144/76 (BP Location: Left Arm, Patient Position: Sitting, Cuff Size: Normal)   Pulse 76   Temp 98.2 F (36.8 C) (Oral)   Ht 5\' 10"  (1.778 m)   Wt 177 lb (80.3 kg)   SpO2 98%   BMI 25.40 kg/m   BP Readings from Last 3 Encounters:  06/12/17 (!) 144/76  04/29/17 (!) 146/72  04/22/17 (!) 152/78    Wt Readings from Last 3 Encounters:  06/12/17 177 lb (80.3 kg)  04/29/17 175 lb (79.4 kg)  04/22/17 175 lb (79.4 kg)    Physical Exam  Constitutional: He is oriented to person,  place, and time. He appears well-developed. No distress.  NAD  HENT:  Mouth/Throat: Oropharynx is clear and moist.  Eyes: Conjunctivae are normal. Pupils are equal, round, and reactive to light.  Neck: Normal range of motion. No JVD present. No thyromegaly present.  Cardiovascular: Normal rate, regular rhythm, normal heart sounds and intact distal pulses. Exam reveals no gallop and no friction rub.  No murmur heard. Pulmonary/Chest: Effort normal and breath sounds normal. No respiratory distress. He has no wheezes. He has no rales. He exhibits no tenderness.  Abdominal: Soft. Bowel sounds are normal. He exhibits no distension and no mass. There  is no tenderness. There is no rebound and no guarding.  Musculoskeletal: Normal range of motion. He exhibits no edema or tenderness.  Lymphadenopathy:    He has no cervical adenopathy.  Neurological: He is alert and oriented to person, place, and time. He has normal reflexes. No cranial nerve deficit. He exhibits normal muscle tone. He displays a negative Romberg sign. Coordination and gait normal.  Skin: Skin is warm and dry. No rash noted.  Psychiatric: He has a normal mood and affect. His behavior is normal. Judgment and thought content normal.    Lab Results  Component Value Date   WBC 6.6 04/22/2017   HGB 13.7 04/22/2017   HCT 40.3 04/22/2017   PLT 210.0 04/22/2017   GLUCOSE 119 (H) 04/22/2017   CHOL 140 04/22/2017   TRIG 113.0 04/22/2017   HDL 48.50 04/22/2017   LDLCALC 69 04/22/2017   ALT 15 04/22/2017   AST 25 04/22/2017   NA 139 04/22/2017   K 4.6 04/22/2017   CL 102 04/22/2017   CREATININE 1.24 04/22/2017   BUN 28 (H) 04/22/2017   CO2 28 04/22/2017   TSH 9.81 (H) 04/22/2017   PSA 0.87 10/20/2014   INR 2.7 05/27/2017   HGBA1C 5.8 04/22/2017    Dg Chest 2 View  Result Date: 11/14/2015 CLINICAL DATA:  Followup pneumonia. History of mitral valve replacement, hypertension, diabetes EXAM: CHEST  2 VIEW COMPARISON:  09/02/2015 FINDINGS: Previously seen right upper lobe pneumonia has resolved. No confluent opacities. No effusions. Heart is normal size. Prior valve replacement. No acute bony abnormality. IMPRESSION: Interval resolution of the previously seen right upper lobe pneumonia. Cardiomegaly. No acute findings. Electronically Signed   By: Anthony Gould M.D.   On: 11/14/2015 10:19    Assessment & Plan:   There are no diagnoses linked to this encounter. I have discontinued Anthony Gould. Anthony Gould "Anthony Gould"'s LOTEMAX. I am also having him maintain his aspirin, latanoprost, promethazine, Milk Thistle, celecoxib, dipyridamole, warfarin, pantoprazole, atorvastatin, bisoprolol,  diltiazem, enalapril, furosemide, and allopurinol.  No orders of the defined types were placed in this encounter.    Follow-up: No Follow-up on file.  Walker Kehr, MD

## 2017-06-12 NOTE — Assessment & Plan Note (Signed)
Labs

## 2017-06-12 NOTE — Patient Instructions (Signed)
CBD oil for pain 

## 2017-06-12 NOTE — Assessment & Plan Note (Signed)
On Diltiazem, Bisoprolol, Enalapril, Furosemide, Coumadin, ASA Coumadin

## 2017-06-12 NOTE — Assessment & Plan Note (Signed)
On Diltiazem, Bisoprolol, Enalapril, Furosemide  

## 2017-07-03 ENCOUNTER — Ambulatory Visit (INDEPENDENT_AMBULATORY_CARE_PROVIDER_SITE_OTHER): Payer: Medicare Other | Admitting: Pharmacist

## 2017-07-03 DIAGNOSIS — I6350 Cerebral infarction due to unspecified occlusion or stenosis of unspecified cerebral artery: Secondary | ICD-10-CM | POA: Diagnosis not present

## 2017-07-03 DIAGNOSIS — Z9889 Other specified postprocedural states: Secondary | ICD-10-CM | POA: Diagnosis not present

## 2017-07-03 DIAGNOSIS — Z7901 Long term (current) use of anticoagulants: Secondary | ICD-10-CM | POA: Diagnosis not present

## 2017-07-03 DIAGNOSIS — I059 Rheumatic mitral valve disease, unspecified: Secondary | ICD-10-CM

## 2017-07-03 DIAGNOSIS — G458 Other transient cerebral ischemic attacks and related syndromes: Secondary | ICD-10-CM | POA: Diagnosis not present

## 2017-07-03 DIAGNOSIS — I482 Chronic atrial fibrillation: Secondary | ICD-10-CM | POA: Diagnosis not present

## 2017-07-03 DIAGNOSIS — I4821 Permanent atrial fibrillation: Secondary | ICD-10-CM

## 2017-07-03 LAB — POCT INR: INR: 4

## 2017-07-03 NOTE — Patient Instructions (Signed)
Description   No Coumadin tomorrow then change dose to 5mg  daily except 7.5mg  every Mondays.  Recheck in 2 weeks.

## 2017-07-18 ENCOUNTER — Other Ambulatory Visit: Payer: Self-pay

## 2017-07-18 MED ORDER — CELECOXIB 200 MG PO CAPS: 200.0000 mg | ORAL_CAPSULE | Freq: Two times a day (BID) | ORAL | 2 refills | Status: DC

## 2017-07-22 ENCOUNTER — Encounter: Payer: Self-pay | Admitting: Internal Medicine

## 2017-07-22 ENCOUNTER — Ambulatory Visit: Payer: Medicare Other | Admitting: Internal Medicine

## 2017-07-22 ENCOUNTER — Ambulatory Visit: Payer: Medicare Other | Admitting: *Deleted

## 2017-07-22 VITALS — BP 132/66 | HR 60 | Ht 70.0 in | Wt 176.8 lb

## 2017-07-22 DIAGNOSIS — I340 Nonrheumatic mitral (valve) insufficiency: Secondary | ICD-10-CM

## 2017-07-22 DIAGNOSIS — I059 Rheumatic mitral valve disease, unspecified: Secondary | ICD-10-CM | POA: Diagnosis not present

## 2017-07-22 DIAGNOSIS — I6350 Cerebral infarction due to unspecified occlusion or stenosis of unspecified cerebral artery: Secondary | ICD-10-CM

## 2017-07-22 DIAGNOSIS — Z952 Presence of prosthetic heart valve: Secondary | ICD-10-CM

## 2017-07-22 DIAGNOSIS — Z7901 Long term (current) use of anticoagulants: Secondary | ICD-10-CM

## 2017-07-22 DIAGNOSIS — I4821 Permanent atrial fibrillation: Secondary | ICD-10-CM

## 2017-07-22 DIAGNOSIS — G458 Other transient cerebral ischemic attacks and related syndromes: Secondary | ICD-10-CM

## 2017-07-22 DIAGNOSIS — E785 Hyperlipidemia, unspecified: Secondary | ICD-10-CM

## 2017-07-22 DIAGNOSIS — I482 Chronic atrial fibrillation: Secondary | ICD-10-CM

## 2017-07-22 DIAGNOSIS — I10 Essential (primary) hypertension: Secondary | ICD-10-CM | POA: Diagnosis not present

## 2017-07-22 DIAGNOSIS — Z9889 Other specified postprocedural states: Secondary | ICD-10-CM

## 2017-07-22 LAB — POCT INR: INR: 3.1

## 2017-07-22 NOTE — Progress Notes (Signed)
Follow-up Outpatient Visit Date: 07/22/2017  Primary Care Provider: Cassandria Anger, [place] [id number]  Chief Complaint: Follow-up atrial fibrillation and valvular heart disease  HPI:  Anthony Gould is a 84 y.o. year-old male with history of severe mitral regurgitation status post mechanical mitral valve replacement, permanent atrial fibrillation, hypertension, hyperlipidemia, TIA on warfarin (now on warfarin, aspirin, and dipyridamole), who presents for follow-up of atrial fibrillation and valvular heart disease.  I last saw Anthony Gould in October, which time he was doing well.  Today he reports "feeling great."  He does not have any chest pain, shortness of breath, palpitations, lightheadedness, orthopnea, PND, or edema.  He is golfing regularly, as the weather allows, without any limitations.  He remains compliant with his medications, including anticoagulation with warfarin, dipyridamole, and aspirin.  He bruises easily but has not had any significant bleeding.  He notes that his PCP decreased atorvastatin to 20 mg twice a week since our last visit due to episodes of nausea.  Nausea has resolved, though Anthony Gould is unsure if changing atorvastatin had any effect.  --------------------------------------------------------------------------------------------------  Past Medical/Surgical History: 1. Atrial fibrillation: Permanent, on coumadin. He has not been able to tolerate metoprolol due to severe fatigue.  2. DM2: diet-controlled 3. HTN 4. Hyperlipidemia 5. History of TIA: Multiple TIAs while on coumadin. Patient was started on dipyridamole years ago for this.  6. 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 Echo (6/13) with EF 50-55%, mild LVH, mildly dilated LV, mechanical mitral valve functioning normally, biatrial enlargement, PA systolic pressure 38 mmHg. Echo (11/14) with EF 50%, mild diffuse hypokinesis,  normal mechanical mitral valve, moderately dilated RV with normal systolic function. Echo (3/16) with mild LVH, EF 50-55%, mechanical mitral valve with mean gradient 5 mmHg, moderately dilated RV with normal systolic function, severe LAE, moderate TR, PASP 46 mmHg.  7. ETT-myoview (12/11): EF 46%, no evidence for ischemia or infarction, there were runs of NSVT reported. ETT-Cardiolite (11/14): not gated, 5:02 exercise, no ischemia or infarction by perfusion images but had multiple runs of NSVT.  8. Diastolic CHF.  9. Gout 10. NSVT: ETT-Cardiolite in 11/14 with runs of NSVT. Holter monitor (11/14) showed predominant atrial fibrillation with some PVCs, he had a few run of 3 beats NSVT.  11. Cirrhosis: Noted by CT. ?NASH versus ETOH.    Current Meds  Medication Sig  . allopurinol (ZYLOPRIM) 300 MG tablet Take 1 tablet (300 mg total) by mouth daily as needed.  [name] aspirin 81 MG EC tablet Take 81 mg by mouth daily.    [name] atorvastatin (LIPITOR) 20 MG tablet Take 1 tablet (20 mg total) by mouth 2 (two) times a week.  . bisoprolol (ZEBETA) 5 MG tablet Take 1 tablet (5 mg total) by mouth daily.  . celecoxib (CELEBREX) 200 MG capsule Take 1 capsule (200 mg total) by mouth 2 (two) times daily.  [name] diltiazem (CARDIZEM CD) 240 MG 24 hr capsule Take 1 capsule (240 mg total) by mouth daily.  [name] dipyridamole (PERSANTINE) 50 MG tablet Take 1 tablet (50 mg total) by mouth 3 (three) times daily.  . enalapril (VASOTEC) 20 MG tablet Take 1 tablet (20 mg total) by mouth daily.  . furosemide (LASIX) 40 MG tablet Take 1 tablet (40 mg total) by mouth daily.  [name] latanoprost (XALATAN) 0.005 % ophthalmic solution Place 1 drop into both eyes at bedtime.  . Milk Thistle 1000 MG CAPS Take 1 capsule by mouth daily.  [name]  pantoprazole (PROTONIX) 40 MG tablet Take 1 tablet (40 mg total) by mouth daily.  . promethazine (PHENERGAN) 25 MG tablet Take 1 tablet (25 mg total) by mouth every 8 (eight) hours as needed for nausea or vomiting.    . warfarin (COUMADIN) 5 MG tablet Take as directed by the Anticoagulation Clinic    Allergies: Sulfonamide derivatives  Social History   Tobacco Use  . Smoking status: Never Smoker  . Smokeless tobacco: Never Used  Substance Use Topics  . Alcohol use: No    Alcohol/week: 0.0 oz  . Drug use: No    Family History  Problem Relation Age of Onset  . Coronary artery disease Father   . Diabetes Other   . Coronary artery disease Other   . Prostate cancer Neg Hx   . Colon cancer Neg Hx     Review of Systems: A 12-system review of systems was performed and was negative except as noted in the HPI.  --------------------------------------------------------------------------------------------------  Physical Exam: BP 132/66   Pulse 60   Ht 5\' 10"  (1.778 m)   Wt 176 lb 12.8 oz (80.2 kg)   BMI 25.37 kg/m   General: NAD. HEENT: No conjunctival pallor or scleral icterus. Moist mucous membranes.  OP clear. Neck: Supple without lymphadenopathy, thyromegaly, JVD, or HJR.  Patch that Lungs: Normal work of breathing. Clear to auscultation bilaterally without wheezes or crackles. Heart: Irregular with mechanical S1 and S2.  No murmurs or rubs.  Nondisplaced PMI. Abd: Bowel sounds present. Soft, NT/ND without hepatosplenomegaly Ext: Trace pretibial edema bilaterally. Skin: Warm and dry without rash.  EKG: Fine atrial fibrillation with possible junctional rhythm.  Rightward axis.  No significant change from prior tracings.  Lab Results  Component Value Date   WBC 6.6 04/22/2017   HGB 13.7 04/22/2017   HCT 40.3 04/22/2017   MCV 100.2 (H) 04/22/2017   PLT 210.0 04/22/2017    Lab Results  Component Value Date   NA 139 04/22/2017   K 4.6 04/22/2017   CL 102 04/22/2017   CO2 28 04/22/2017   BUN 28 (H) 04/22/2017   CREATININE 1.24 04/22/2017   GLUCOSE 119 (H) 04/22/2017   ALT 15 04/22/2017    Lab Results  Component Value Date   CHOL 140 04/22/2017   HDL 48.50 04/22/2017    LDLCALC 69 04/22/2017   TRIG 113.0 04/22/2017   CHOLHDL 3 04/22/2017    --------------------------------------------------------------------------------------------------  ASSESSMENT AND PLAN: Permanent atrial fibrillation No symptoms.  Heart rate remains borderline low, though Anthony Gould is tolerating this well.  We have agreed to continue current doses of bisoprolol and diltiazem.  He will also need to remain on chronic anticoagulation in the setting of mechanical mitral valve and history of TIAs.  Severe mitral regurgitation status post mechanical mitral valve No symptoms to suggest valve dysfunction.  Continue indefinite anticoagulation with combination of warfarin, dipyridamole, and aspirin.  Continue with antimicrobial prophylaxis for dental procedures.  Hypertension Upper normal to borderline elevated blood pressure today.  We will not make any medication changes at this time.  Labs in January showed improved creatinine and normal potassium.  Hyperlipidemia Continue atorvastatin.  It is reasonable to continue twice weekly dosing, as directed by Dr. Alain Marion.  Follow-up: Return to clinic in 1 year.  Nelva Bush, MD 07/23/2017 7:21 PM

## 2017-07-22 NOTE — Patient Instructions (Signed)
Description   Continue same  dose of coumadin  5mg  daily except 7.5mg  every Mondays.  Recheck in 4 weeks.

## 2017-07-22 NOTE — Patient Instructions (Addendum)
Medication Instructions:  Your physician recommends that you continue on your current medications as directed. Please refer to the Current Medication list given to you today.  -- If you need a refill on your cardiac medications before your next appointment, please call your pharmacy. --  Labwork: None ordered  Testing/Procedures: None ordered  Follow-Up: Your physician wants you to follow-up in: 1 year with Dr. End.    You will receive a reminder letter in the mail two months in advance. If you don't receive a letter, please call our office to schedule the follow-up appointment.  Thank you for choosing [facility]!!    Any Other Special Instructions Will Be Listed Below (If Applicable).         

## 2017-07-23 ENCOUNTER — Encounter: Payer: Self-pay | Admitting: Internal Medicine

## 2017-08-16 ENCOUNTER — Ambulatory Visit: Payer: Medicare Other | Admitting: *Deleted

## 2017-08-16 DIAGNOSIS — G458 Other transient cerebral ischemic attacks and related syndromes: Secondary | ICD-10-CM

## 2017-08-16 DIAGNOSIS — Z7901 Long term (current) use of anticoagulants: Secondary | ICD-10-CM | POA: Diagnosis not present

## 2017-08-16 DIAGNOSIS — I6350 Cerebral infarction due to unspecified occlusion or stenosis of unspecified cerebral artery: Secondary | ICD-10-CM

## 2017-08-16 DIAGNOSIS — I059 Rheumatic mitral valve disease, unspecified: Secondary | ICD-10-CM | POA: Diagnosis not present

## 2017-08-16 DIAGNOSIS — Z9889 Other specified postprocedural states: Secondary | ICD-10-CM | POA: Diagnosis not present

## 2017-08-16 DIAGNOSIS — I4821 Permanent atrial fibrillation: Secondary | ICD-10-CM

## 2017-08-16 LAB — POCT INR: INR: 3.6 — AB (ref 2.0–3.0)

## 2017-08-16 NOTE — Patient Instructions (Addendum)
Description   Tomorrow take 2.5mg  then continue same dose of coumadin 5mg  daily except 7.5mg  every Mondays.  Recheck in 4 weeks.

## 2017-09-03 DIAGNOSIS — H25043 Posterior subcapsular polar age-related cataract, bilateral: Secondary | ICD-10-CM | POA: Diagnosis not present

## 2017-09-03 DIAGNOSIS — I10 Essential (primary) hypertension: Secondary | ICD-10-CM | POA: Diagnosis not present

## 2017-09-03 DIAGNOSIS — H2513 Age-related nuclear cataract, bilateral: Secondary | ICD-10-CM | POA: Diagnosis not present

## 2017-09-03 DIAGNOSIS — H02834 Dermatochalasis of left upper eyelid: Secondary | ICD-10-CM | POA: Diagnosis not present

## 2017-09-19 ENCOUNTER — Ambulatory Visit (INDEPENDENT_AMBULATORY_CARE_PROVIDER_SITE_OTHER): Payer: Medicare Other | Admitting: *Deleted

## 2017-09-19 DIAGNOSIS — Z5181 Encounter for therapeutic drug level monitoring: Secondary | ICD-10-CM

## 2017-09-19 DIAGNOSIS — Z7901 Long term (current) use of anticoagulants: Secondary | ICD-10-CM | POA: Diagnosis not present

## 2017-09-19 DIAGNOSIS — I059 Rheumatic mitral valve disease, unspecified: Secondary | ICD-10-CM

## 2017-09-19 DIAGNOSIS — I6350 Cerebral infarction due to unspecified occlusion or stenosis of unspecified cerebral artery: Secondary | ICD-10-CM | POA: Diagnosis not present

## 2017-09-19 DIAGNOSIS — I480 Paroxysmal atrial fibrillation: Secondary | ICD-10-CM

## 2017-09-19 DIAGNOSIS — I482 Chronic atrial fibrillation: Secondary | ICD-10-CM

## 2017-09-19 DIAGNOSIS — Z9889 Other specified postprocedural states: Secondary | ICD-10-CM | POA: Diagnosis not present

## 2017-09-19 DIAGNOSIS — I4821 Permanent atrial fibrillation: Secondary | ICD-10-CM

## 2017-09-19 LAB — POCT INR: INR: 2.9 (ref 2.0–3.0)

## 2017-09-19 NOTE — Patient Instructions (Signed)
Description   Today  June 27th take another 1/2 tablet as he has already taken 1 tablet then  continue same dose of coumadin 5mg  daily except 7.5mg  every Mondays.  Recheck in 2 weeks.

## 2017-10-02 ENCOUNTER — Ambulatory Visit: Payer: Medicare Other | Admitting: *Deleted

## 2017-10-02 DIAGNOSIS — Z9889 Other specified postprocedural states: Secondary | ICD-10-CM | POA: Diagnosis not present

## 2017-10-02 DIAGNOSIS — I059 Rheumatic mitral valve disease, unspecified: Secondary | ICD-10-CM | POA: Diagnosis not present

## 2017-10-02 DIAGNOSIS — Z7901 Long term (current) use of anticoagulants: Secondary | ICD-10-CM

## 2017-10-02 DIAGNOSIS — I6350 Cerebral infarction due to unspecified occlusion or stenosis of unspecified cerebral artery: Secondary | ICD-10-CM | POA: Diagnosis not present

## 2017-10-02 LAB — POCT INR: INR: 2.7 (ref 2.0–3.0)

## 2017-10-02 NOTE — Patient Instructions (Signed)
Description   Today take an  1/2 tablet as he has already taken 1 tablet then continue same dose of coumadin 5mg  daily except 7.5mg  every Mondays.  Recheck in 2 weeks.

## 2017-10-08 DIAGNOSIS — H3562 Retinal hemorrhage, left eye: Secondary | ICD-10-CM | POA: Diagnosis not present

## 2017-10-08 DIAGNOSIS — H35033 Hypertensive retinopathy, bilateral: Secondary | ICD-10-CM | POA: Diagnosis not present

## 2017-10-08 DIAGNOSIS — H2513 Age-related nuclear cataract, bilateral: Secondary | ICD-10-CM | POA: Diagnosis not present

## 2017-10-16 ENCOUNTER — Ambulatory Visit: Payer: Medicare Other | Admitting: *Deleted

## 2017-10-16 DIAGNOSIS — I4891 Unspecified atrial fibrillation: Secondary | ICD-10-CM | POA: Diagnosis not present

## 2017-10-16 DIAGNOSIS — Z7901 Long term (current) use of anticoagulants: Secondary | ICD-10-CM | POA: Diagnosis not present

## 2017-10-16 DIAGNOSIS — I6350 Cerebral infarction due to unspecified occlusion or stenosis of unspecified cerebral artery: Secondary | ICD-10-CM

## 2017-10-16 DIAGNOSIS — I059 Rheumatic mitral valve disease, unspecified: Secondary | ICD-10-CM | POA: Diagnosis not present

## 2017-10-16 DIAGNOSIS — Z9889 Other specified postprocedural states: Secondary | ICD-10-CM | POA: Diagnosis not present

## 2017-10-16 LAB — POCT INR: INR: 3.3 — AB (ref 2.0–3.0)

## 2017-10-16 NOTE — Patient Instructions (Signed)
Description   Continue same dose of coumadin 5mg  daily except 7.5mg  every Mondays.  Recheck in 3 weeks. [facility] [phone]

## 2017-10-21 ENCOUNTER — Encounter: Payer: Self-pay | Admitting: Internal Medicine

## 2017-10-21 ENCOUNTER — Other Ambulatory Visit: Payer: Self-pay | Admitting: *Deleted

## 2017-10-21 ENCOUNTER — Other Ambulatory Visit (INDEPENDENT_AMBULATORY_CARE_PROVIDER_SITE_OTHER): Payer: Medicare Other

## 2017-10-21 ENCOUNTER — Ambulatory Visit (INDEPENDENT_AMBULATORY_CARE_PROVIDER_SITE_OTHER): Payer: Medicare Other | Admitting: Internal Medicine

## 2017-10-21 VITALS — BP 134/72 | HR 61 | Temp 97.6°F | Ht 70.0 in | Wt 174.0 lb

## 2017-10-21 DIAGNOSIS — K7460 Unspecified cirrhosis of liver: Secondary | ICD-10-CM

## 2017-10-21 DIAGNOSIS — I4891 Unspecified atrial fibrillation: Secondary | ICD-10-CM | POA: Diagnosis not present

## 2017-10-21 DIAGNOSIS — I10 Essential (primary) hypertension: Secondary | ICD-10-CM

## 2017-10-21 DIAGNOSIS — E1159 Type 2 diabetes mellitus with other circulatory complications: Secondary | ICD-10-CM

## 2017-10-21 DIAGNOSIS — E785 Hyperlipidemia, unspecified: Secondary | ICD-10-CM

## 2017-10-21 LAB — BASIC METABOLIC PANEL
BUN: 28 mg/dL — ABNORMAL HIGH (ref 6–23)
CO2: 23 meq/L (ref 19–32)
Calcium: 9.9 mg/dL (ref 8.4–10.5)
Chloride: 103 mEq/L (ref 96–112)
Creatinine, Ser: 1.32 mg/dL (ref 0.40–1.50)
GFR: 55.15 mL/min — AB (ref >60.00)
GLUCOSE: 120 mg/dL — AB (ref 70–99)
POTASSIUM: 4.2 meq/L (ref 3.5–5.1)
SODIUM: 138 meq/L (ref 135–145)

## 2017-10-21 LAB — T4, FREE: Free T4: 0.95 ng/dL (ref 0.60–1.60)

## 2017-10-21 LAB — TSH: TSH: 7.91 u[IU]/mL — ABNORMAL HIGH (ref 0.35–4.50)

## 2017-10-21 LAB — HEPATIC FUNCTION PANEL
ALK PHOS: 79 U/L (ref 39–117)
ALT: 16 U/L (ref 0–53)
AST: 25 U/L (ref 0–37)
Albumin: 4.5 g/dL (ref 3.5–5.2)
Bilirubin, Direct: 0.2 mg/dL (ref 0.0–0.3)
Total Bilirubin: 0.9 mg/dL (ref 0.2–1.2)
Total Protein: 7.8 g/dL (ref 6.0–8.3)

## 2017-10-21 LAB — HEMOGLOBIN A1C: Hgb A1c MFr Bld: 5.9 % (ref 4.6–6.5)

## 2017-10-21 MED ORDER — WARFARIN SODIUM 5 MG PO TABS: ORAL_TABLET | ORAL | 1 refills | Status: DC

## 2017-10-21 NOTE — Assessment & Plan Note (Signed)
Labs

## 2017-10-21 NOTE — Assessment & Plan Note (Signed)
Watching labs 

## 2017-10-21 NOTE — Progress Notes (Signed)
Subjective:  Patient ID: Anthony Gould, male    DOB: 15-Nov-1933  Age: 84 y.o. MRN: [id number]  CC: No chief complaint on file.   HPI XIAN ALVES presents for CAD, dyslipidemia, OA. No abd pain  Outpatient Medications Prior to Visit  Medication Sig Dispense Refill  . allopurinol (ZYLOPRIM) 300 MG tablet Take 1 tablet (300 mg total) by mouth daily as needed. 90 tablet 2  . aspirin 81 MG EC tablet Take 81 mg by mouth daily.      [name] atorvastatin (LIPITOR) 20 MG tablet Take 1 tablet (20 mg total) by mouth 2 (two) times a week. 30 tablet 2  . bisoprolol (ZEBETA) 5 MG tablet Take 1 tablet (5 mg total) by mouth daily. 90 tablet 3  . celecoxib (CELEBREX) 200 MG capsule Take 1 capsule (200 mg total) by mouth 2 (two) times daily. 60 capsule 2  . dipyridamole (PERSANTINE) 50 MG tablet Take 1 tablet (50 mg total) by mouth 3 (three) times daily. 270 tablet 3  . enalapril (VASOTEC) 20 MG tablet Take 1 tablet (20 mg total) by mouth daily. 90 tablet 3  . furosemide (LASIX) 40 MG tablet Take 1 tablet (40 mg total) by mouth daily. 90 tablet 3  . latanoprost (XALATAN) 0.005 % ophthalmic solution Place 1 drop into both eyes at bedtime.    . Milk Thistle 1000 MG CAPS Take 1 capsule by mouth daily.    . pantoprazole (PROTONIX) 40 MG tablet Take 1 tablet (40 mg total) by mouth daily. 90 tablet 3  . promethazine (PHENERGAN) 25 MG tablet Take 1 tablet (25 mg total) by mouth every 8 (eight) hours as needed for nausea or vomiting. 20 tablet 1  . warfarin (COUMADIN) 5 MG tablet Take as directed by the Anticoagulation Clinic 135 tablet 1  . diltiazem (CARDIZEM CD) 240 MG 24 hr capsule Take 1 capsule (240 mg total) by mouth daily. 90 capsule 3   No facility-administered medications prior to visit.     ROS: Review of Systems  Constitutional: Negative for appetite change, fatigue and unexpected weight change.  HENT: Negative for congestion, nosebleeds, sneezing, sore throat and trouble swallowing.   Eyes:  Negative for itching and visual disturbance.  Respiratory: Negative for cough.   Cardiovascular: Negative for chest pain, palpitations and leg swelling.  Gastrointestinal: Negative for abdominal distention, blood in stool, diarrhea and nausea.  Genitourinary: Negative for frequency and hematuria.  Musculoskeletal: Negative for back pain, gait problem, joint swelling and neck pain.  Skin: Negative for rash.  Neurological: Negative for dizziness, tremors, speech difficulty and weakness.  Psychiatric/Behavioral: Negative for agitation, dysphoric mood and sleep disturbance. The patient is not nervous/anxious.     Objective:  BP 134/72 (BP Location: Left Arm, Patient Position: Sitting, Cuff Size: Normal)   Pulse 61   Temp 97.6 F (36.4 C) (Oral)   Ht 5\' 10"  (1.778 m)   Wt 174 lb (78.9 kg)   SpO2 96%   BMI 24.97 kg/m   BP Readings from Last 3 Encounters:  10/21/17 134/72  07/22/17 132/66  06/12/17 (!) 144/76    Wt Readings from Last 3 Encounters:  10/21/17 174 lb (78.9 kg)  07/22/17 176 lb 12.8 oz (80.2 kg)  06/12/17 177 lb (80.3 kg)    Physical Exam  Constitutional: He is oriented to person, place, and time. He appears well-developed. No distress.  NAD  HENT:  Mouth/Throat: Oropharynx is clear and moist.  Eyes: Pupils are equal, round, and reactive to  light. Conjunctivae are normal.  Neck: Normal range of motion. No JVD present. No thyromegaly present.  Cardiovascular: Normal rate, regular rhythm, normal heart sounds and intact distal pulses. Exam reveals no gallop and no friction rub.  No murmur heard. Pulmonary/Chest: Effort normal and breath sounds normal. No respiratory distress. He has no wheezes. He has no rales. He exhibits no tenderness.  Abdominal: Soft. Bowel sounds are normal. He exhibits no distension and no mass. There is no tenderness. There is no rebound and no guarding.  Musculoskeletal: Normal range of motion. He exhibits no edema or tenderness.    Lymphadenopathy:    He has no cervical adenopathy.  Neurological: He is alert and oriented to person, place, and time. He has normal reflexes. No cranial nerve deficit. He exhibits normal muscle tone. He displays a negative Romberg sign. Coordination and gait normal.  Skin: Skin is warm and dry. No rash noted.  Psychiatric: He has a normal mood and affect. His behavior is normal. Judgment and thought content normal.  bradycardic  Lab Results  Component Value Date   WBC 6.6 04/22/2017   HGB 13.7 04/22/2017   HCT 40.3 04/22/2017   PLT 210.0 04/22/2017   GLUCOSE 119 (H) 04/22/2017   CHOL 140 04/22/2017   TRIG 113.0 04/22/2017   HDL 48.50 04/22/2017   LDLCALC 69 04/22/2017   ALT 15 04/22/2017   AST 25 04/22/2017   NA 139 04/22/2017   K 4.6 04/22/2017   CL 102 04/22/2017   CREATININE 1.24 04/22/2017   BUN 28 (H) 04/22/2017   CO2 28 04/22/2017   TSH 9.81 (H) 04/22/2017   PSA 0.87 10/20/2014   INR 3.3 (A) 10/16/2017   HGBA1C 5.8 04/22/2017    Dg Chest 2 View  Result Date: 11/14/2015 CLINICAL DATA:  Followup pneumonia. History of mitral valve replacement, hypertension, diabetes EXAM: CHEST  2 VIEW COMPARISON:  09/02/2015 FINDINGS: Previously seen right upper lobe pneumonia has resolved. No confluent opacities. No effusions. Heart is normal size. Prior valve replacement. No acute bony abnormality. IMPRESSION: Interval resolution of the previously seen right upper lobe pneumonia. Cardiomegaly. No acute findings. Electronically Signed   By: Rolm Baptise M.D.   On: 11/14/2015 10:19    Assessment & Plan:   There are no diagnoses linked to this encounter.   No orders of the defined types were placed in this encounter.    Follow-up: No follow-ups on file.  Walker Kehr, MD

## 2017-10-21 NOTE — Assessment & Plan Note (Signed)
Diltiazem, Bisoprolol, Enalapril, Furosemide  

## 2017-10-21 NOTE — Assessment & Plan Note (Signed)
Bisoprolol, Diltiazem, ASA, Coumadin 

## 2017-10-21 NOTE — Assessment & Plan Note (Addendum)
Doing well. No alcohol LFTs AFP

## 2017-10-21 NOTE — Telephone Encounter (Signed)
Pt called for a refill on Warfarin 5mg  tablets to [facility]. Advised pt that I would send at this time.

## 2017-10-22 LAB — AFP TUMOR MARKER: AFP TUMOR MARKER: 2.1 ng/mL (ref <6.1)

## 2017-10-23 ENCOUNTER — Telehealth: Payer: Self-pay

## 2017-10-23 NOTE — Telephone Encounter (Signed)
Pt would like a 30 day supply of the next lowest dose of the Diltiazem.  Pt states this was discussed at [facility]

## 2017-10-24 MED ORDER — DILTIAZEM HCL ER COATED BEADS 180 MG PO CP24: 180.0000 mg | ORAL_CAPSULE | Freq: Every day | ORAL | 3 refills | Status: DC

## 2017-10-24 NOTE — Telephone Encounter (Signed)
Rx emailed to his mail pharmacy Thx

## 2017-10-28 DIAGNOSIS — H2512 Age-related nuclear cataract, left eye: Secondary | ICD-10-CM | POA: Diagnosis not present

## 2017-10-28 DIAGNOSIS — H25812 Combined forms of age-related cataract, left eye: Secondary | ICD-10-CM | POA: Diagnosis not present

## 2017-10-29 DIAGNOSIS — H2511 Age-related nuclear cataract, right eye: Secondary | ICD-10-CM | POA: Diagnosis not present

## 2017-10-29 DIAGNOSIS — Z961 Presence of intraocular lens: Secondary | ICD-10-CM | POA: Diagnosis not present

## 2017-11-06 ENCOUNTER — Ambulatory Visit (INDEPENDENT_AMBULATORY_CARE_PROVIDER_SITE_OTHER): Payer: Medicare Other | Admitting: Pharmacist

## 2017-11-06 DIAGNOSIS — Z7901 Long term (current) use of anticoagulants: Secondary | ICD-10-CM | POA: Diagnosis not present

## 2017-11-06 DIAGNOSIS — I059 Rheumatic mitral valve disease, unspecified: Secondary | ICD-10-CM

## 2017-11-06 DIAGNOSIS — I6350 Cerebral infarction due to unspecified occlusion or stenosis of unspecified cerebral artery: Secondary | ICD-10-CM | POA: Diagnosis not present

## 2017-11-06 DIAGNOSIS — Z9889 Other specified postprocedural states: Secondary | ICD-10-CM | POA: Diagnosis not present

## 2017-11-06 LAB — POCT INR: INR: 3.1 — AB (ref 2.0–3.0)

## 2017-11-06 NOTE — Patient Instructions (Signed)
Description   Continue same dose of coumadin 5mg  daily except 7.5mg  every Mondays.  Recheck in 4 weeks. [facility] Clinic [phone]

## 2017-11-18 DIAGNOSIS — H52201 Unspecified astigmatism, right eye: Secondary | ICD-10-CM | POA: Diagnosis not present

## 2017-11-18 DIAGNOSIS — H25811 Combined forms of age-related cataract, right eye: Secondary | ICD-10-CM | POA: Diagnosis not present

## 2017-11-18 DIAGNOSIS — H2511 Age-related nuclear cataract, right eye: Secondary | ICD-10-CM | POA: Diagnosis not present

## 2017-12-06 ENCOUNTER — Ambulatory Visit: Payer: Medicare Other

## 2017-12-06 DIAGNOSIS — I6350 Cerebral infarction due to unspecified occlusion or stenosis of unspecified cerebral artery: Secondary | ICD-10-CM

## 2017-12-06 DIAGNOSIS — I059 Rheumatic mitral valve disease, unspecified: Secondary | ICD-10-CM

## 2017-12-06 DIAGNOSIS — Z7901 Long term (current) use of anticoagulants: Secondary | ICD-10-CM | POA: Diagnosis not present

## 2017-12-06 DIAGNOSIS — Z9889 Other specified postprocedural states: Secondary | ICD-10-CM

## 2017-12-06 LAB — POCT INR: INR: 3.4 — AB (ref 2.0–3.0)

## 2017-12-06 NOTE — Patient Instructions (Signed)
Description   Continue same dose of coumadin 5mg  daily except 7.5mg  every Mondays.  Recheck in 6 weeks. [facility]madin Clinic [phone]

## 2017-12-18 ENCOUNTER — Other Ambulatory Visit (INDEPENDENT_AMBULATORY_CARE_PROVIDER_SITE_OTHER): Payer: Medicare Other

## 2017-12-18 ENCOUNTER — Ambulatory Visit: Payer: Medicare Other | Admitting: Internal Medicine

## 2017-12-18 ENCOUNTER — Encounter: Payer: Self-pay | Admitting: Internal Medicine

## 2017-12-18 VITALS — BP 150/74 | HR 88 | Temp 98.1°F | Ht 70.0 in | Wt 176.0 lb

## 2017-12-18 DIAGNOSIS — E1159 Type 2 diabetes mellitus with other circulatory complications: Secondary | ICD-10-CM

## 2017-12-18 DIAGNOSIS — R61 Generalized hyperhidrosis: Secondary | ICD-10-CM

## 2017-12-18 DIAGNOSIS — K7460 Unspecified cirrhosis of liver: Secondary | ICD-10-CM

## 2017-12-18 DIAGNOSIS — R319 Hematuria, unspecified: Secondary | ICD-10-CM | POA: Diagnosis not present

## 2017-12-18 DIAGNOSIS — I10 Essential (primary) hypertension: Secondary | ICD-10-CM | POA: Diagnosis not present

## 2017-12-18 LAB — CBC WITH DIFFERENTIAL/PLATELET
BASOS PCT: 1.1 % (ref 0.0–3.0)
Basophils Absolute: 0.1 10*3/uL (ref 0.0–0.1)
EOS PCT: 5.5 % — AB (ref 0.0–5.0)
Eosinophils Absolute: 0.4 10*3/uL (ref 0.0–0.7)
HEMATOCRIT: 37.4 % — AB (ref 39.0–52.0)
HEMOGLOBIN: 12.9 g/dL — AB (ref 13.0–17.0)
LYMPHS PCT: 16.5 % (ref 12.0–46.0)
Lymphs Abs: 1.1 10*3/uL (ref 0.7–4.0)
MCHC: 34.6 g/dL (ref 30.0–36.0)
MCV: 96.4 fl (ref 78.0–100.0)
MONO ABS: 0.6 10*3/uL (ref 0.1–1.0)
MONOS PCT: 8.4 % (ref 3.0–12.0)
Neutro Abs: 4.7 10*3/uL (ref 1.4–7.7)
Neutrophils Relative %: 68.5 % (ref 43.0–77.0)
Platelets: 336 10*3/uL (ref 150.0–400.0)
RBC: 3.88 Mil/uL — ABNORMAL LOW (ref 4.22–5.81)
RDW: 14.1 % (ref 11.5–15.5)
WBC: 6.8 10*3/uL (ref 4.0–10.5)

## 2017-12-18 LAB — HEPATIC FUNCTION PANEL
ALT: 17 U/L (ref 0–53)
AST: 22 U/L (ref 0–37)
Albumin: 4.2 g/dL (ref 3.5–5.2)
Alkaline Phosphatase: 117 U/L (ref 39–117)
BILIRUBIN TOTAL: 0.9 mg/dL (ref 0.2–1.2)
Bilirubin, Direct: 0.3 mg/dL (ref 0.0–0.3)
Total Protein: 7.8 g/dL (ref 6.0–8.3)

## 2017-12-18 LAB — URINALYSIS, ROUTINE W REFLEX MICROSCOPIC
Bilirubin Urine: NEGATIVE
Ketones, ur: NEGATIVE
Leukocytes, UA: NEGATIVE
Nitrite: NEGATIVE
PH: 6 (ref 5.0–8.0)
Total Protein, Urine: 30 — AB
Urine Glucose: NEGATIVE
Urobilinogen, UA: 0.2 (ref 0.0–1.0)

## 2017-12-18 LAB — BASIC METABOLIC PANEL
BUN: 15 mg/dL (ref 6–23)
CALCIUM: 9.9 mg/dL (ref 8.4–10.5)
CO2: 27 mEq/L (ref 19–32)
CREATININE: 1.02 mg/dL (ref 0.40–1.50)
Chloride: 98 mEq/L (ref 96–112)
GFR: 74.23 mL/min (ref >60.00)
GLUCOSE: 120 mg/dL — AB (ref 70–99)
Potassium: 4.5 mEq/L (ref 3.5–5.1)
Sodium: 133 mEq/L — ABNORMAL LOW (ref 135–145)

## 2017-12-18 LAB — LIPASE: Lipase: 15 U/L (ref 11.0–59.0)

## 2017-12-18 LAB — TSH: TSH: 5.84 u[IU]/mL — ABNORMAL HIGH (ref 0.35–4.50)

## 2017-12-18 LAB — HEMOGLOBIN A1C: Hgb A1c MFr Bld: 5.9 % (ref 4.6–6.5)

## 2017-12-18 LAB — SEDIMENTATION RATE: Sed Rate: 68 mm/hr — ABNORMAL HIGH (ref 0–20)

## 2017-12-18 MED ORDER — CEFUROXIME AXETIL 250 MG PO TABS: 250.0000 mg | ORAL_TABLET | Freq: Two times a day (BID) | ORAL | 0 refills | Status: AC

## 2017-12-18 NOTE — Progress Notes (Signed)
Subjective:  Patient ID: Anthony Gould, male    DOB: 19-Mar-1934  Age: 84 y.o. MRN: [id number]  CC: No chief complaint on file.   HPI JAVARIE CRISP presents for blood in urine x 1 episode of blood on underwear C/o "virus" 2 wks ago - not feeling well since then. He had nausea. No cough. C/o night sweats. T was 102 1 wk ago  Outpatient Medications Prior to Visit  Medication Sig Dispense Refill  . allopurinol (ZYLOPRIM) 300 MG tablet Take 1 tablet (300 mg total) by mouth daily as needed. 90 tablet 2  . aspirin 81 MG EC tablet Take 81 mg by mouth daily.      [name] atorvastatin (LIPITOR) 20 MG tablet Take 1 tablet (20 mg total) by mouth 2 (two) times a week. 30 tablet 2  . bisoprolol (ZEBETA) 5 MG tablet Take 1 tablet (5 mg total) by mouth daily. 90 tablet 3  . celecoxib (CELEBREX) 200 MG capsule Take 1 capsule (200 mg total) by mouth 2 (two) times daily. 60 capsule 2  . diltiazem (CARDIZEM CD) 180 MG 24 hr capsule Take 1 capsule (180 mg total) by mouth daily. 90 capsule 3  . dipyridamole (PERSANTINE) 50 MG tablet Take 1 tablet (50 mg total) by mouth 3 (three) times daily. 270 tablet 3  . enalapril (VASOTEC) 20 MG tablet Take 1 tablet (20 mg total) by mouth daily. 90 tablet 3  . furosemide (LASIX) 40 MG tablet Take 1 tablet (40 mg total) by mouth daily. 90 tablet 3  . latanoprost (XALATAN) 0.005 % ophthalmic solution Place 1 drop into both eyes at bedtime.    . Milk Thistle 1000 MG CAPS Take 1 capsule by mouth daily.    . pantoprazole (PROTONIX) 40 MG tablet Take 1 tablet (40 mg total) by mouth daily. 90 tablet 3  . promethazine (PHENERGAN) 25 MG tablet Take 1 tablet (25 mg total) by mouth every 8 (eight) hours as needed for nausea or vomiting. 20 tablet 1  . warfarin (COUMADIN) 5 MG tablet Take as directed by the Anticoagulation Clinic 135 tablet 1   No facility-administered medications prior to visit.     ROS: Review of Systems  Constitutional: Negative for appetite change, fatigue  and unexpected weight change.  HENT: Negative for congestion, nosebleeds, sneezing, sore throat and trouble swallowing.   Eyes: Negative for itching and visual disturbance.  Respiratory: Negative for cough.   Cardiovascular: Negative for chest pain, palpitations and leg swelling.  Gastrointestinal: Negative for abdominal distention, blood in stool, diarrhea and nausea.  Genitourinary: Positive for hematuria. Negative for frequency.  Musculoskeletal: Negative for back pain, gait problem, joint swelling and neck pain.  Skin: Negative for rash.  Neurological: Negative for dizziness, tremors, speech difficulty and weakness.  Psychiatric/Behavioral: Negative for agitation, dysphoric mood and sleep disturbance. The patient is not nervous/anxious.     Objective:  BP (!) 150/74 (BP Location: Left Arm, Patient Position: Sitting, Cuff Size: Normal)   Pulse 88   Temp 98.1 F (36.7 C) (Oral)   Ht 5\' 10"  (1.778 m)   Wt 176 lb (79.8 kg)   SpO2 92%   BMI 25.25 kg/m   BP Readings from Last 3 Encounters:  12/18/17 (!) 150/74  10/21/17 134/72  07/22/17 132/66    Wt Readings from Last 3 Encounters:  12/18/17 176 lb (79.8 kg)  10/21/17 174 lb (78.9 kg)  07/22/17 176 lb 12.8 oz (80.2 kg)    Physical Exam  Constitutional: He is  oriented to person, place, and time. He appears well-developed. No distress.  NAD  HENT:  Mouth/Throat: Oropharynx is clear and moist.  Eyes: Pupils are equal, round, and reactive to light. Conjunctivae are normal.  Neck: Normal range of motion. No JVD present. No thyromegaly present.  Cardiovascular: Normal rate, regular rhythm, normal heart sounds and intact distal pulses. Exam reveals no gallop and no friction rub.  No murmur heard. Pulmonary/Chest: Effort normal and breath sounds normal. No respiratory distress. He has no wheezes. He has no rales. He exhibits no tenderness.  Abdominal: Soft. Bowel sounds are normal. He exhibits no distension and no mass. There is  no tenderness. There is no rebound and no guarding.  Musculoskeletal: Normal range of motion. He exhibits no edema or tenderness.  Lymphadenopathy:    He has no cervical adenopathy.  Neurological: He is alert and oriented to person, place, and time. He has normal reflexes. No cranial nerve deficit. He exhibits normal muscle tone. He displays a negative Romberg sign. Coordination and gait normal.  Skin: Skin is warm and dry. No rash noted.  Psychiatric: He has a normal mood and affect. His behavior is normal. Judgment and thought content normal.    Lab Results  Component Value Date   WBC 6.6 04/22/2017   HGB 13.7 04/22/2017   HCT 40.3 04/22/2017   PLT 210.0 04/22/2017   GLUCOSE 120 (H) 10/21/2017   CHOL 140 04/22/2017   TRIG 113.0 04/22/2017   HDL 48.50 04/22/2017   LDLCALC 69 04/22/2017   ALT 16 10/21/2017   AST 25 10/21/2017   NA 138 10/21/2017   K 4.2 10/21/2017   CL 103 10/21/2017   CREATININE 1.32 10/21/2017   BUN 28 (H) 10/21/2017   CO2 23 10/21/2017   TSH 7.91 (H) 10/21/2017   PSA 0.87 10/20/2014   INR 3.4 (A) 12/06/2017   HGBA1C 5.9 10/21/2017    Dg Chest 2 View  Result Date: 11/14/2015 CLINICAL DATA:  Followup pneumonia. History of mitral valve replacement, hypertension, diabetes EXAM: CHEST  2 VIEW COMPARISON:  09/02/2015 FINDINGS: Previously seen right upper lobe pneumonia has resolved. No confluent opacities. No effusions. Heart is normal size. Prior valve replacement. No acute bony abnormality. IMPRESSION: Interval resolution of the previously seen right upper lobe pneumonia. Cardiomegaly. No acute findings. Electronically Signed   By: Rolm Baptise M.D.   On: 11/14/2015 10:19    Assessment & Plan:   There are no diagnoses linked to this encounter.   No orders of the defined types were placed in this encounter.    Follow-up: No follow-ups on file.  Walker Kehr, MD

## 2017-12-18 NOTE — Assessment & Plan Note (Signed)
Repeat abd [place] Labs

## 2017-12-18 NOTE — Assessment & Plan Note (Signed)
Labs

## 2017-12-18 NOTE — Assessment & Plan Note (Signed)
Poss UTI vs other Labs, UA

## 2017-12-25 ENCOUNTER — Encounter: Payer: Self-pay | Admitting: Internal Medicine

## 2017-12-25 ENCOUNTER — Ambulatory Visit: Payer: Medicare Other | Admitting: Internal Medicine

## 2017-12-25 VITALS — BP 144/76 | HR 72 | Temp 98.0°F | Ht 70.0 in | Wt 173.0 lb

## 2017-12-25 DIAGNOSIS — R319 Hematuria, unspecified: Secondary | ICD-10-CM | POA: Diagnosis not present

## 2017-12-25 DIAGNOSIS — Z23 Encounter for immunization: Secondary | ICD-10-CM | POA: Diagnosis not present

## 2017-12-25 DIAGNOSIS — R61 Generalized hyperhidrosis: Secondary | ICD-10-CM

## 2017-12-25 NOTE — Assessment & Plan Note (Signed)
Much better -- ?prostatitis - finish abx Night sweats  Abd [place] pending May need a Urology ref - pt refused cystoscopy

## 2017-12-25 NOTE — Progress Notes (Signed)
Subjective:  Patient ID: Anthony Gould, male    DOB: [birth year]  Age: 84 y.o. MRN: [id number]  CC: No chief complaint on file.   HPI Anthony Gould presents for blood in urine, fever. Feeling better - no relapse. [place] pending  Outpatient Medications Prior to Visit  Medication Sig Dispense Refill  . allopurinol (ZYLOPRIM) 300 MG tablet Take 1 tablet (300 mg total) by mouth daily as needed. 90 tablet 2  . aspirin 81 MG EC tablet Take 81 mg by mouth daily.      [name] atorvastatin (LIPITOR) 20 MG tablet Take 1 tablet (20 mg total) by mouth 2 (two) times a week. 30 tablet 2  . bisoprolol (ZEBETA) 5 MG tablet Take 1 tablet (5 mg total) by mouth daily. 90 tablet 3  . cefUROXime (CEFTIN) 250 MG tablet Take 1 tablet (250 mg total) by mouth 2 (two) times daily for 14 days. 20 tablet 0  . celecoxib (CELEBREX) 200 MG capsule Take 1 capsule (200 mg total) by mouth 2 (two) times daily. 60 capsule 2  . diltiazem (CARDIZEM CD) 180 MG 24 hr capsule Take 1 capsule (180 mg total) by mouth daily. 90 capsule 3  . dipyridamole (PERSANTINE) 50 MG tablet Take 1 tablet (50 mg total) by mouth 3 (three) times daily. 270 tablet 3  . enalapril (VASOTEC) 20 MG tablet Take 1 tablet (20 mg total) by mouth daily. 90 tablet 3  . furosemide (LASIX) 40 MG tablet Take 1 tablet (40 mg total) by mouth daily. 90 tablet 3  . latanoprost (XALATAN) 0.005 % ophthalmic solution Place 1 drop into both eyes at bedtime.    . Milk Thistle 1000 MG CAPS Take 1 capsule by mouth daily.    . pantoprazole (PROTONIX) 40 MG tablet Take 1 tablet (40 mg total) by mouth daily. 90 tablet 3  . promethazine (PHENERGAN) 25 MG tablet Take 1 tablet (25 mg total) by mouth every 8 (eight) hours as needed for nausea or vomiting. 20 tablet 1  . warfarin (COUMADIN) 5 MG tablet Take as directed by the Anticoagulation Clinic 135 tablet 1   No facility-administered medications prior to visit.     ROS: Review of Systems  Constitutional: Negative for  appetite change, fatigue and unexpected weight change.  HENT: Negative for congestion, nosebleeds, sneezing, sore throat and trouble swallowing.   Eyes: Negative for itching and visual disturbance.  Respiratory: Negative for cough.   Cardiovascular: Negative for chest pain, palpitations and leg swelling.  Gastrointestinal: Negative for abdominal distention, blood in stool, diarrhea and nausea.  Genitourinary: Negative for frequency and hematuria.  Musculoskeletal: Negative for back pain, gait problem, joint swelling and neck pain.  Skin: Negative for rash.  Neurological: Negative for dizziness, tremors, speech difficulty and weakness.  Psychiatric/Behavioral: Negative for agitation, dysphoric mood, sleep disturbance and suicidal ideas. The patient is not nervous/anxious.     Objective:  BP (!) 144/76 (BP Location: Left Arm, Patient Position: Sitting, Cuff Size: Normal)   Pulse 72   Temp 98 F (36.7 C) (Oral)   Ht 5\' 10"  (1.778 m)   Wt 173 lb (78.5 kg)   SpO2 96%   BMI 24.82 kg/m   BP Readings from Last 3 Encounters:  12/25/17 (!) 144/76  12/18/17 (!) 150/74  10/21/17 134/72    Wt Readings from Last 3 Encounters:  12/25/17 173 lb (78.5 kg)  12/18/17 176 lb (79.8 kg)  10/21/17 174 lb (78.9 kg)    Physical Exam  Constitutional: He  is oriented to person, place, and time. He appears well-developed. No distress.  NAD  HENT:  Mouth/Throat: Oropharynx is clear and moist.  Eyes: Pupils are equal, round, and reactive to light. Conjunctivae are normal.  Neck: Normal range of motion. No JVD present. No thyromegaly present.  Cardiovascular: Normal rate, regular rhythm, normal heart sounds and intact distal pulses. Exam reveals no gallop and no friction rub.  No murmur heard. Pulmonary/Chest: Effort normal and breath sounds normal. No respiratory distress. He has no wheezes. He has no rales. He exhibits no tenderness.  Abdominal: Soft. Bowel sounds are normal. He exhibits no  distension and no mass. There is no tenderness. There is no rebound and no guarding.  Musculoskeletal: Normal range of motion. He exhibits no edema or tenderness.  Lymphadenopathy:    He has no cervical adenopathy.  Neurological: He is alert and oriented to person, place, and time. He has normal reflexes. No cranial nerve deficit. He exhibits normal muscle tone. He displays a negative Romberg sign. Coordination and gait normal.  Skin: Skin is warm and dry. No rash noted.  Psychiatric: He has a normal mood and affect. His behavior is normal. Judgment and thought content normal.    Lab Results  Component Value Date   WBC 6.8 12/18/2017   HGB 12.9 (L) 12/18/2017   HCT 37.4 (L) 12/18/2017   PLT 336.0 12/18/2017   GLUCOSE 120 (H) 12/18/2017   CHOL 140 04/22/2017   TRIG 113.0 04/22/2017   HDL 48.50 04/22/2017   LDLCALC 69 04/22/2017   ALT 17 12/18/2017   AST 22 12/18/2017   NA 133 (L) 12/18/2017   K 4.5 12/18/2017   CL 98 12/18/2017   CREATININE 1.02 12/18/2017   BUN 15 12/18/2017   CO2 27 12/18/2017   TSH 5.84 (H) 12/18/2017   PSA 0.87 10/20/2014   INR 3.4 (A) 12/06/2017   HGBA1C 5.9 12/18/2017    Dg Chest 2 View  Result Date: 11/14/2015 CLINICAL DATA:  Followup pneumonia. History of mitral valve replacement, hypertension, diabetes EXAM: CHEST  2 VIEW COMPARISON:  09/02/2015 FINDINGS: Previously seen right upper lobe pneumonia has resolved. No confluent opacities. No effusions. Heart is normal size. Prior valve replacement. No acute bony abnormality. IMPRESSION: Interval resolution of the previously seen right upper lobe pneumonia. Cardiomegaly. No acute findings. Electronically Signed   By: Rolm Baptise M.D.   On: 11/14/2015 10:19    Assessment & Plan:   There are no diagnoses linked to this encounter.   No orders of the defined types were placed in this encounter.    Follow-up: No follow-ups on file.  Walker Kehr, MD

## 2017-12-25 NOTE — Assessment & Plan Note (Signed)
?  prostatitis - finish abx Night sweats  Abd [place] pending May need a Urology ref - pt refused cystoscopy

## 2017-12-25 NOTE — Addendum Note (Signed)
Addended by: Karren Cobble on: 12/25/2017 11:33 AM   Modules accepted: Orders

## 2017-12-26 ENCOUNTER — Ambulatory Visit
Admission: RE | Admit: 2017-12-26 | Discharge: 2017-12-26 | Disposition: A | Discharge status: Home / Self Care | Payer: Medicare Other | Source: Ambulatory Visit | Attending: Internal Medicine | Admitting: Internal Medicine

## 2017-12-26 DIAGNOSIS — K7460 Unspecified cirrhosis of liver: Secondary | ICD-10-CM | POA: Diagnosis not present

## 2017-12-30 ENCOUNTER — Other Ambulatory Visit: Payer: Self-pay | Admitting: Internal Medicine

## 2017-12-30 DIAGNOSIS — I4821 Permanent atrial fibrillation: Secondary | ICD-10-CM

## 2017-12-30 DIAGNOSIS — I10 Essential (primary) hypertension: Secondary | ICD-10-CM

## 2017-12-30 DIAGNOSIS — Z952 Presence of prosthetic heart valve: Secondary | ICD-10-CM

## 2017-12-30 NOTE — Telephone Encounter (Signed)
Please review for refill. Thanks!  

## 2018-01-02 ENCOUNTER — Telehealth: Payer: Self-pay | Admitting: *Deleted

## 2018-01-02 NOTE — Telephone Encounter (Signed)
[employer] Non-Formulary Exception Form completed and signed by Dr End.  For Dipyridamole tablets. Diagnosis codes used I48.91, Z98.890, I05.9.

## 2018-01-03 ENCOUNTER — Telehealth: Payer: Self-pay | Admitting: Internal Medicine

## 2018-01-03 NOTE — Telephone Encounter (Signed)
[employer] calling regarding non formulary exception request for Dipyridamole 50 mg tablets. States insurance coverage was approved for 1 year. Pt has been notified.

## 2018-01-03 NOTE — Telephone Encounter (Signed)
Called pharmacy and they had already been notified and the medication was in process to send to the patient.

## 2018-01-15 ENCOUNTER — Ambulatory Visit: Payer: Medicare Other | Admitting: *Deleted

## 2018-01-15 DIAGNOSIS — I059 Rheumatic mitral valve disease, unspecified: Secondary | ICD-10-CM | POA: Diagnosis not present

## 2018-01-15 DIAGNOSIS — Z7901 Long term (current) use of anticoagulants: Secondary | ICD-10-CM

## 2018-01-15 DIAGNOSIS — Z9889 Other specified postprocedural states: Secondary | ICD-10-CM

## 2018-01-15 DIAGNOSIS — I6350 Cerebral infarction due to unspecified occlusion or stenosis of unspecified cerebral artery: Secondary | ICD-10-CM | POA: Diagnosis not present

## 2018-01-15 DIAGNOSIS — G459 Transient cerebral ischemic attack, unspecified: Secondary | ICD-10-CM

## 2018-01-15 DIAGNOSIS — I4891 Unspecified atrial fibrillation: Secondary | ICD-10-CM

## 2018-01-15 LAB — POCT INR: INR: 4.6 — AB (ref 2.0–3.0)

## 2018-01-15 NOTE — Patient Instructions (Signed)
Description   Do not take any Coumadin tomorrow then continue same dose of coumadin 5mg  daily except 7.5mg  every Mondays.  Recheck in 3 weeks. [facility] [phone]

## 2018-02-05 ENCOUNTER — Ambulatory Visit: Payer: Medicare Other

## 2018-02-05 DIAGNOSIS — Z9889 Other specified postprocedural states: Secondary | ICD-10-CM | POA: Diagnosis not present

## 2018-02-05 DIAGNOSIS — I6350 Cerebral infarction due to unspecified occlusion or stenosis of unspecified cerebral artery: Secondary | ICD-10-CM

## 2018-02-05 DIAGNOSIS — I059 Rheumatic mitral valve disease, unspecified: Secondary | ICD-10-CM

## 2018-02-05 DIAGNOSIS — Z7901 Long term (current) use of anticoagulants: Secondary | ICD-10-CM

## 2018-02-05 DIAGNOSIS — I4891 Unspecified atrial fibrillation: Secondary | ICD-10-CM

## 2018-02-05 DIAGNOSIS — G459 Transient cerebral ischemic attack, unspecified: Secondary | ICD-10-CM

## 2018-02-05 LAB — POCT INR: INR: 4.4 — AB (ref 2.0–3.0)

## 2018-02-05 NOTE — Patient Instructions (Signed)
Description   Skip tomorrow's dosage of Coumadin, then start taking 5mg  daily.  Recheck in 2 weeks. [facility] [phone]

## 2018-02-07 ENCOUNTER — Other Ambulatory Visit: Payer: Self-pay

## 2018-02-07 MED ORDER — ATORVASTATIN CALCIUM 20 MG PO TABS: 20.0000 mg | ORAL_TABLET | ORAL | 2 refills | Status: DC

## 2018-02-19 ENCOUNTER — Encounter (INDEPENDENT_AMBULATORY_CARE_PROVIDER_SITE_OTHER): Payer: Self-pay

## 2018-02-19 ENCOUNTER — Ambulatory Visit: Payer: Medicare Other | Admitting: *Deleted

## 2018-02-19 DIAGNOSIS — Z9889 Other specified postprocedural states: Secondary | ICD-10-CM | POA: Diagnosis not present

## 2018-02-19 DIAGNOSIS — G459 Transient cerebral ischemic attack, unspecified: Secondary | ICD-10-CM

## 2018-02-19 DIAGNOSIS — Z7901 Long term (current) use of anticoagulants: Secondary | ICD-10-CM

## 2018-02-19 DIAGNOSIS — I059 Rheumatic mitral valve disease, unspecified: Secondary | ICD-10-CM

## 2018-02-19 DIAGNOSIS — I4891 Unspecified atrial fibrillation: Secondary | ICD-10-CM

## 2018-02-19 DIAGNOSIS — I6350 Cerebral infarction due to unspecified occlusion or stenosis of unspecified cerebral artery: Secondary | ICD-10-CM | POA: Diagnosis not present

## 2018-02-19 LAB — POCT INR: INR: 3.6 — AB (ref 2.0–3.0)

## 2018-02-19 NOTE — Patient Instructions (Addendum)
Description   Tomorrow only take 2.5mg , then continue taking 5mg  daily.  Recheck in 2-3 weeks. [facility] [phone]

## 2018-03-10 ENCOUNTER — Telehealth: Payer: Self-pay | Admitting: Internal Medicine

## 2018-03-10 ENCOUNTER — Ambulatory Visit: Payer: Medicare Other | Admitting: *Deleted

## 2018-03-10 DIAGNOSIS — Z9889 Other specified postprocedural states: Secondary | ICD-10-CM

## 2018-03-10 DIAGNOSIS — Z7901 Long term (current) use of anticoagulants: Secondary | ICD-10-CM | POA: Diagnosis not present

## 2018-03-10 DIAGNOSIS — G459 Transient cerebral ischemic attack, unspecified: Secondary | ICD-10-CM

## 2018-03-10 DIAGNOSIS — I6350 Cerebral infarction due to unspecified occlusion or stenosis of unspecified cerebral artery: Secondary | ICD-10-CM

## 2018-03-10 DIAGNOSIS — I4891 Unspecified atrial fibrillation: Secondary | ICD-10-CM | POA: Diagnosis not present

## 2018-03-10 DIAGNOSIS — I059 Rheumatic mitral valve disease, unspecified: Secondary | ICD-10-CM

## 2018-03-10 LAB — POCT INR: INR: 2.9 (ref 2.0–3.0)

## 2018-03-10 NOTE — Telephone Encounter (Signed)
New message   Since Dr End is leaving [facility] , patient would like to switch from Dr End to Dr Angelena Form, please advise

## 2018-03-10 NOTE — Patient Instructions (Signed)
Description   Today take another 2.5mg , then continue taking 5mg  daily.  Recheck in 2-3 weeks. [facility] [phone]

## 2018-03-10 NOTE — Telephone Encounter (Signed)
OK with me Chris  

## 2018-03-11 NOTE — Telephone Encounter (Signed)
That is fine with me.  Chris

## 2018-03-31 ENCOUNTER — Ambulatory Visit: Payer: Medicare Other | Admitting: Pharmacist

## 2018-03-31 DIAGNOSIS — I059 Rheumatic mitral valve disease, unspecified: Secondary | ICD-10-CM | POA: Diagnosis not present

## 2018-03-31 DIAGNOSIS — Z9889 Other specified postprocedural states: Secondary | ICD-10-CM

## 2018-03-31 DIAGNOSIS — Z7901 Long term (current) use of anticoagulants: Secondary | ICD-10-CM | POA: Diagnosis not present

## 2018-03-31 DIAGNOSIS — I6350 Cerebral infarction due to unspecified occlusion or stenosis of unspecified cerebral artery: Secondary | ICD-10-CM

## 2018-03-31 LAB — POCT INR: INR: 3.4 — AB (ref 2.0–3.0)

## 2018-03-31 NOTE — Patient Instructions (Signed)
Description   Continue taking 5mg  daily.  Recheck in 4 weeks. [facility] [phone]

## 2018-04-04 ENCOUNTER — Telehealth: Payer: Self-pay

## 2018-04-04 MED ORDER — AMOXICILLIN 500 MG PO CAPS: 2000.0000 mg | ORAL_CAPSULE | Freq: Every day | ORAL | 3 refills | Status: DC | PRN

## 2018-04-04 NOTE — Telephone Encounter (Signed)
Not true.  Anthony Gould will need antibiotic prophylaxis for dental work for the rest of his life because he has a mechanical heart valve.  I will renew the antibiotic. Thank you

## 2018-04-04 NOTE — Telephone Encounter (Signed)
Copied from [place] [id number]. Topic: General - Inquiry >> Apr 03, 2018  8:28 AM Conception Chancy, NT wrote: Reason for CRM: patient is calling and states that he has a dentist appointment on 04/07/18 Monday and he has always taken a antibiotic prior since he has a heart valve. He said he was told this time he no longer has to do that but he wants to make sure with Dr. Alain Marion first.

## 2018-04-07 NOTE — Telephone Encounter (Signed)
The patient is aware.  She will continue with antibiotic prophylaxis for dental work

## 2018-04-23 ENCOUNTER — Encounter: Payer: Self-pay | Admitting: Internal Medicine

## 2018-04-23 ENCOUNTER — Ambulatory Visit: Payer: Medicare Other | Admitting: Internal Medicine

## 2018-04-23 DIAGNOSIS — I10 Essential (primary) hypertension: Secondary | ICD-10-CM

## 2018-04-23 DIAGNOSIS — I4821 Permanent atrial fibrillation: Secondary | ICD-10-CM

## 2018-04-23 DIAGNOSIS — I2510 Atherosclerotic heart disease of native coronary artery without angina pectoris: Secondary | ICD-10-CM | POA: Diagnosis not present

## 2018-04-23 DIAGNOSIS — R61 Generalized hyperhidrosis: Secondary | ICD-10-CM

## 2018-04-23 DIAGNOSIS — I480 Paroxysmal atrial fibrillation: Secondary | ICD-10-CM

## 2018-04-23 DIAGNOSIS — K7460 Unspecified cirrhosis of liver: Secondary | ICD-10-CM

## 2018-04-23 DIAGNOSIS — E785 Hyperlipidemia, unspecified: Secondary | ICD-10-CM

## 2018-04-23 DIAGNOSIS — L989 Disorder of the skin and subcutaneous tissue, unspecified: Secondary | ICD-10-CM | POA: Insufficient documentation

## 2018-04-23 DIAGNOSIS — Z7901 Long term (current) use of anticoagulants: Secondary | ICD-10-CM

## 2018-04-23 MED ORDER — FUROSEMIDE 40 MG PO TABS: 40.0000 mg | ORAL_TABLET | Freq: Every day | ORAL | 3 refills | Status: DC

## 2018-04-23 MED ORDER — ALLOPURINOL 300 MG PO TABS: 300.0000 mg | ORAL_TABLET | Freq: Every day | ORAL | 3 refills | Status: DC | PRN

## 2018-04-23 NOTE — Assessment & Plan Note (Signed)
ASA, diltiazem, Lipitor, Bisoprolol

## 2018-04-23 NOTE — Assessment & Plan Note (Signed)
Coumadin 

## 2018-04-23 NOTE — Progress Notes (Signed)
Subjective:  Patient ID: Anthony Gould, male    DOB: 26-May-1933  Age: 85 y.o. MRN: [id number]  CC: No chief complaint on file.   HPI [name] presents for R foot lesion x 6 mo F/u sweats - resolved, HTN, CAD   Outpatient Medications Prior to Visit  Medication Sig Dispense Refill  . amoxicillin (AMOXIL) 500 MG capsule Take 4 capsules (2,000 mg total) by mouth daily as needed (Take 1 hour prior to dental work). 20 capsule 3  . aspirin 81 MG EC tablet Take 81 mg by mouth daily.      [name] atorvastatin (LIPITOR) 20 MG tablet Take 1 tablet (20 mg total) by mouth 2 (two) times a week. 30 tablet 2  . bisoprolol (ZEBETA) 5 MG tablet Take 1 tablet (5 mg total) by mouth daily. (Patient taking differently: Take 2.5 mg by mouth daily. ) 90 tablet 3  . celecoxib (CELEBREX) 200 MG capsule Take 1 capsule (200 mg total) by mouth 2 (two) times daily. 60 capsule 2  . diltiazem (CARTIA XT) 240 MG 24 hr capsule Take 240 mg by mouth daily.    [name] dipyridamole (PERSANTINE) 50 MG tablet TAKE 1 TABLET BY MOUTH THREE TIMES DAILY 270 tablet 1  . enalapril (VASOTEC) 20 MG tablet Take 1 tablet (20 mg total) by mouth daily. 90 tablet 3  . latanoprost (XALATAN) 0.005 % ophthalmic solution Place 1 drop into both eyes at bedtime.    . Milk Thistle 1000 MG CAPS Take 1 capsule by mouth daily.    . pantoprazole (PROTONIX) 40 MG tablet Take 1 tablet (40 mg total) by mouth daily. 90 tablet 3  . promethazine (PHENERGAN) 25 MG tablet Take 1 tablet (25 mg total) by mouth every 8 (eight) hours as needed for nausea or vomiting. 20 tablet 1  . warfarin (COUMADIN) 5 MG tablet Take as directed by the Anticoagulation Clinic 135 tablet 1  . allopurinol (ZYLOPRIM) 300 MG tablet Take 1 tablet (300 mg total) by mouth daily as needed. 90 tablet 2  . furosemide (LASIX) 40 MG tablet Take 1 tablet (40 mg total) by mouth daily. 90 tablet 3  . diltiazem (CARDIZEM CD) 180 MG 24 hr capsule Take 1 capsule (180 mg total) by mouth daily.  90 capsule 3   No facility-administered medications prior to visit.     ROS: Review of Systems  Constitutional: Negative for appetite change, fatigue and unexpected weight change.  HENT: Negative for congestion, nosebleeds, sneezing, sore throat and trouble swallowing.   Eyes: Negative for itching and visual disturbance.  Respiratory: Negative for cough.   Cardiovascular: Negative for chest pain, palpitations and leg swelling.  Gastrointestinal: Negative for abdominal distention, blood in stool, diarrhea and nausea.  Genitourinary: Negative for frequency and hematuria.  Musculoskeletal: Negative for back pain, gait problem, joint swelling and neck pain.  Skin: Positive for color change. Negative for rash.  Neurological: Negative for dizziness, tremors, speech difficulty and weakness.  Psychiatric/Behavioral: Negative for agitation, dysphoric mood and sleep disturbance. The patient is not nervous/anxious.     Objective:  BP (!) 146/74 (BP Location: Left Arm, Patient Position: Sitting, Cuff Size: Normal)   Pulse 79   Temp 98 F (36.7 C) (Oral)   Ht 5\' 10"  (1.778 m)   Wt 172 lb (78 kg)   SpO2 97%   BMI 24.68 kg/m   BP Readings from Last 3 Encounters:  04/23/18 (!) 146/74  12/25/17 (!) 144/76  12/18/17 (!) 150/74  Wt Readings from Last 3 Encounters:  04/23/18 172 lb (78 kg)  12/25/17 173 lb (78.5 kg)  12/18/17 176 lb (79.8 kg)    Physical Exam Constitutional:      General: He is not in acute distress.    Appearance: He is well-developed.     Comments: NAD  Eyes:     Conjunctiva/sclera: Conjunctivae normal.     Pupils: Pupils are equal, round, and reactive to light.  Neck:     Musculoskeletal: Normal range of motion.     Thyroid: No thyromegaly.     Vascular: No JVD.  Cardiovascular:     Rate and Rhythm: Normal rate and regular rhythm.     Heart sounds: Normal heart sounds. No murmur. No friction rub. No gallop.   Pulmonary:     Effort: Pulmonary effort is  normal. No respiratory distress.     Breath sounds: Normal breath sounds. No wheezing or rales.  Chest:     Chest wall: No tenderness.  Abdominal:     General: Bowel sounds are normal. There is no distension.     Palpations: Abdomen is soft. There is no mass.     Tenderness: There is no abdominal tenderness. There is no guarding or rebound.  Musculoskeletal: Normal range of motion.        General: No tenderness.  Lymphadenopathy:     Cervical: No cervical adenopathy.  Skin:    General: Skin is warm and dry.     Findings: Lesion present. No rash.  Neurological:     Mental Status: He is alert and oriented to person, place, and time.     Cranial Nerves: No cranial nerve deficit.     Motor: No abnormal muscle tone.     Coordination: Coordination normal.     Gait: Gait normal.     Deep Tendon Reflexes: Reflexes are normal and symmetric.  Psychiatric:        Behavior: Behavior normal.        Thought Content: Thought content normal.        Judgment: Judgment normal.   R dorsal foot lesion - ?skin cancer  Lab Results  Component Value Date   WBC 6.8 12/18/2017   HGB 12.9 (L) 12/18/2017   HCT 37.4 (L) 12/18/2017   PLT 336.0 12/18/2017   GLUCOSE 120 (H) 12/18/2017   CHOL 140 04/22/2017   TRIG 113.0 04/22/2017   HDL 48.50 04/22/2017   LDLCALC 69 04/22/2017   ALT 17 12/18/2017   AST 22 12/18/2017   NA 133 (L) 12/18/2017   K 4.5 12/18/2017   CL 98 12/18/2017   CREATININE 1.02 12/18/2017   BUN 15 12/18/2017   CO2 27 12/18/2017   TSH 5.84 (H) 12/18/2017   PSA 0.87 10/20/2014   INR 3.4 (A) 03/31/2018   HGBA1C 5.9 12/18/2017    US Abdomen Complete  Result Date: 12/26/2017 CLINICAL DATA:  Hematuria, night sweats, known hepatic cirrhosis. EXAM: ABDOMEN ULTRASOUND COMPLETE COMPARISON:  Abdominal and pelvic CT scan of September 22, 2017 FINDINGS: Gallbladder: The gallbladder surgically absent. Common bile duct: Diameter: 4 mm Liver: The hepatic echotexture is heterogeneously increased.  The surface contour is mildly irregular. There is no focal mass or ductal dilation. Portal vein is patent on color Doppler imaging with normal direction of blood flow towards the liver. IVC: No abnormality visualized. Pancreas: Bowel gas limits evaluation of the pancreatic head and tail. The pancreatic body is normal in appearance. Spleen: 11 cm in length.  The echotexture is  normal. Right Kidney: Length: 11.2 cm. There is an area of increased echotexture in the upper pole of the right kidney which corresponds to a focus of fat on the previous CT scan. The echotexture elsewhere the renal cortex is normal. There is no hydronephrosis. No stones are evident. Left Kidney: Length: 11.3 cm. Echogenicity within normal limits. No mass or hydronephrosis visualized. Abdominal aorta: There is mural plaque.  There is no aneurysm. Other findings: There is no ascites. IMPRESSION: Cirrhotic changes within the liver. No suspicious masses. Previous cholecystectomy. Limited visualization of the pancreas. Normal size spleen.  No ascites. 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 Electronically Signed   By: David  Martinique M.D.   On: 12/26/2017 08:50    Assessment & Plan:   Diagnoses and all orders for this visit:  Essential hypertension -     furosemide (LASIX) 40 MG tablet; Take 1 tablet (40 mg total) by mouth daily.  Permanent atrial fibrillation -     furosemide (LASIX) 40 MG tablet; Take 1 tablet (40 mg total) by mouth daily.  Other orders -     allopurinol (ZYLOPRIM) 300 MG tablet; Take 1 tablet (300 mg total) by mouth daily as needed.     Meds ordered this encounter  Medications  . allopurinol (ZYLOPRIM) 300 MG tablet    Sig: Take 1 tablet (300 mg total) by mouth daily as needed.    Dispense:  90 tablet    Refill:  3  . furosemide (LASIX) 40 MG tablet    Sig: Take 1 tablet (40 mg  total) by mouth daily.    Dispense:  90 tablet    Refill:  3     Follow-up: No follow-ups on file.  Walker Kehr, MD

## 2018-04-23 NOTE — Assessment & Plan Note (Signed)
On Diltiazem, Bisoprolol, Enalapril, Furosemide  

## 2018-04-23 NOTE — Patient Instructions (Signed)
Weighted blanket 

## 2018-04-23 NOTE — Assessment & Plan Note (Signed)
No sx's 

## 2018-04-23 NOTE — Assessment & Plan Note (Signed)
Resolved

## 2018-04-23 NOTE — Assessment & Plan Note (Signed)
R dorsal foot - ?skin cancer Derm ref

## 2018-04-23 NOTE — Assessment & Plan Note (Signed)
Lipitor 

## 2018-04-28 ENCOUNTER — Ambulatory Visit: Payer: Medicare Other

## 2018-04-28 ENCOUNTER — Other Ambulatory Visit: Payer: Self-pay | Admitting: Internal Medicine

## 2018-04-28 DIAGNOSIS — I6350 Cerebral infarction due to unspecified occlusion or stenosis of unspecified cerebral artery: Secondary | ICD-10-CM

## 2018-04-28 DIAGNOSIS — Z7901 Long term (current) use of anticoagulants: Secondary | ICD-10-CM | POA: Diagnosis not present

## 2018-04-28 DIAGNOSIS — Z9889 Other specified postprocedural states: Secondary | ICD-10-CM

## 2018-04-28 DIAGNOSIS — I4891 Unspecified atrial fibrillation: Secondary | ICD-10-CM

## 2018-04-28 DIAGNOSIS — G459 Transient cerebral ischemic attack, unspecified: Secondary | ICD-10-CM

## 2018-04-28 DIAGNOSIS — I059 Rheumatic mitral valve disease, unspecified: Secondary | ICD-10-CM

## 2018-04-28 LAB — POCT INR: INR: 2.6 (ref 2.0–3.0)

## 2018-04-28 NOTE — Patient Instructions (Signed)
Description   Take an extra 1/2 tablet today, then start taking 5mg  daily except 7.5mg  on Mondays.  Recheck in 3 weeks. [facility] [phone]

## 2018-05-16 ENCOUNTER — Ambulatory Visit: Payer: Medicare Other | Admitting: *Deleted

## 2018-05-16 DIAGNOSIS — I6350 Cerebral infarction due to unspecified occlusion or stenosis of unspecified cerebral artery: Secondary | ICD-10-CM

## 2018-05-16 DIAGNOSIS — G459 Transient cerebral ischemic attack, unspecified: Secondary | ICD-10-CM

## 2018-05-16 DIAGNOSIS — Z9889 Other specified postprocedural states: Secondary | ICD-10-CM

## 2018-05-16 DIAGNOSIS — I059 Rheumatic mitral valve disease, unspecified: Secondary | ICD-10-CM

## 2018-05-16 DIAGNOSIS — Z7901 Long term (current) use of anticoagulants: Secondary | ICD-10-CM

## 2018-05-16 DIAGNOSIS — I4891 Unspecified atrial fibrillation: Secondary | ICD-10-CM | POA: Diagnosis not present

## 2018-05-16 LAB — POCT INR: INR: 3.3 — AB (ref 2.0–3.0)

## 2018-05-16 NOTE — Patient Instructions (Signed)
Description   Continue taking 5mg daily except 7.5mg on Mondays. Recheck in 4 weeks. [facility] [phone]     

## 2018-06-11 ENCOUNTER — Other Ambulatory Visit: Payer: Self-pay | Admitting: Dermatology

## 2018-06-11 ENCOUNTER — Encounter: Payer: Self-pay | Admitting: Internal Medicine

## 2018-06-11 DIAGNOSIS — L570 Actinic keratosis: Secondary | ICD-10-CM | POA: Diagnosis not present

## 2018-06-11 DIAGNOSIS — D229 Melanocytic nevi, unspecified: Secondary | ICD-10-CM | POA: Diagnosis not present

## 2018-06-11 DIAGNOSIS — L989 Disorder of the skin and subcutaneous tissue, unspecified: Secondary | ICD-10-CM | POA: Diagnosis not present

## 2018-06-11 DIAGNOSIS — C4491 Basal cell carcinoma of skin, unspecified: Secondary | ICD-10-CM | POA: Diagnosis not present

## 2018-06-11 DIAGNOSIS — D485 Neoplasm of uncertain behavior of skin: Secondary | ICD-10-CM | POA: Diagnosis not present

## 2018-06-12 ENCOUNTER — Telehealth: Payer: Self-pay

## 2018-06-12 NOTE — Telephone Encounter (Signed)

## 2018-06-18 ENCOUNTER — Telehealth: Payer: Self-pay

## 2018-06-18 NOTE — Telephone Encounter (Signed)

## 2018-06-20 ENCOUNTER — Ambulatory Visit (INDEPENDENT_AMBULATORY_CARE_PROVIDER_SITE_OTHER): Payer: Medicare Other | Admitting: *Deleted

## 2018-06-20 DIAGNOSIS — Z9889 Other specified postprocedural states: Secondary | ICD-10-CM

## 2018-06-20 DIAGNOSIS — Z7901 Long term (current) use of anticoagulants: Secondary | ICD-10-CM | POA: Diagnosis not present

## 2018-06-20 DIAGNOSIS — I6350 Cerebral infarction due to unspecified occlusion or stenosis of unspecified cerebral artery: Secondary | ICD-10-CM

## 2018-06-20 DIAGNOSIS — G459 Transient cerebral ischemic attack, unspecified: Secondary | ICD-10-CM

## 2018-06-20 DIAGNOSIS — I059 Rheumatic mitral valve disease, unspecified: Secondary | ICD-10-CM

## 2018-06-20 DIAGNOSIS — I4891 Unspecified atrial fibrillation: Secondary | ICD-10-CM

## 2018-06-20 LAB — POCT INR: INR: 3.5 — AB (ref 2.0–3.0)

## 2018-06-30 ENCOUNTER — Other Ambulatory Visit: Payer: Self-pay | Admitting: Internal Medicine

## 2018-06-30 DIAGNOSIS — Z952 Presence of prosthetic heart valve: Secondary | ICD-10-CM

## 2018-06-30 DIAGNOSIS — I10 Essential (primary) hypertension: Secondary | ICD-10-CM

## 2018-06-30 DIAGNOSIS — I4821 Permanent atrial fibrillation: Secondary | ICD-10-CM

## 2018-06-30 NOTE — Telephone Encounter (Signed)
Please review for refill.  

## 2018-07-11 ENCOUNTER — Ambulatory Visit: Payer: Medicare Other | Admitting: Cardiovascular Disease

## 2018-07-16 ENCOUNTER — Telehealth: Payer: Self-pay

## 2018-07-16 NOTE — Telephone Encounter (Signed)

## 2018-07-18 ENCOUNTER — Other Ambulatory Visit: Payer: Self-pay

## 2018-07-18 ENCOUNTER — Ambulatory Visit (INDEPENDENT_AMBULATORY_CARE_PROVIDER_SITE_OTHER): Payer: Medicare Other

## 2018-07-18 DIAGNOSIS — I4891 Unspecified atrial fibrillation: Secondary | ICD-10-CM

## 2018-07-18 DIAGNOSIS — Z9889 Other specified postprocedural states: Secondary | ICD-10-CM | POA: Diagnosis not present

## 2018-07-18 DIAGNOSIS — I059 Rheumatic mitral valve disease, unspecified: Secondary | ICD-10-CM

## 2018-07-18 DIAGNOSIS — I6350 Cerebral infarction due to unspecified occlusion or stenosis of unspecified cerebral artery: Secondary | ICD-10-CM | POA: Diagnosis not present

## 2018-07-18 DIAGNOSIS — Z7901 Long term (current) use of anticoagulants: Secondary | ICD-10-CM | POA: Diagnosis not present

## 2018-07-18 LAB — POCT INR: INR: 3.9 — AB (ref 2.0–3.0)

## 2018-07-18 MED ORDER — BISOPROLOL FUMARATE 5 MG PO TABS: 2.5000 mg | ORAL_TABLET | Freq: Every day | ORAL | 3 refills | Status: DC

## 2018-07-18 MED ORDER — ENALAPRIL MALEATE 20 MG PO TABS: 20.0000 mg | ORAL_TABLET | Freq: Every day | ORAL | 3 refills | Status: DC

## 2018-07-18 NOTE — Patient Instructions (Signed)
Description   Spoke with pt and instructed pt to take take 1/2 tablet tomorrow then continue taking 5mg  daily except 7.5mg  on Mondays. Resume normal leafy intake.  Recheck in 4 weeks. [facility] [phone]

## 2018-08-02 ENCOUNTER — Other Ambulatory Visit: Payer: Self-pay | Admitting: Internal Medicine

## 2018-08-04 ENCOUNTER — Other Ambulatory Visit: Payer: Self-pay | Admitting: Internal Medicine

## 2018-08-04 DIAGNOSIS — I4821 Permanent atrial fibrillation: Secondary | ICD-10-CM

## 2018-08-04 DIAGNOSIS — I10 Essential (primary) hypertension: Secondary | ICD-10-CM

## 2018-08-04 DIAGNOSIS — Z952 Presence of prosthetic heart valve: Secondary | ICD-10-CM

## 2018-08-04 NOTE — Telephone Encounter (Signed)
Please review for refill.  

## 2018-08-04 NOTE — Telephone Encounter (Signed)
I've never refilled this med before.  Is this new?

## 2018-08-05 NOTE — Telephone Encounter (Signed)
Please review for refill.  

## 2018-08-05 NOTE — Telephone Encounter (Signed)
Please forward to Dr. Saunders Revel

## 2018-08-14 ENCOUNTER — Telehealth: Payer: Self-pay

## 2018-08-14 NOTE — Telephone Encounter (Signed)
lmom for prescreen  

## 2018-08-14 NOTE — Telephone Encounter (Signed)

## 2018-08-15 ENCOUNTER — Other Ambulatory Visit: Payer: Self-pay

## 2018-08-15 ENCOUNTER — Ambulatory Visit (INDEPENDENT_AMBULATORY_CARE_PROVIDER_SITE_OTHER): Payer: Medicare Other | Admitting: *Deleted

## 2018-08-15 DIAGNOSIS — Z9889 Other specified postprocedural states: Secondary | ICD-10-CM

## 2018-08-15 DIAGNOSIS — I059 Rheumatic mitral valve disease, unspecified: Secondary | ICD-10-CM

## 2018-08-15 DIAGNOSIS — G459 Transient cerebral ischemic attack, unspecified: Secondary | ICD-10-CM

## 2018-08-15 DIAGNOSIS — I4891 Unspecified atrial fibrillation: Secondary | ICD-10-CM

## 2018-08-15 DIAGNOSIS — Z7901 Long term (current) use of anticoagulants: Secondary | ICD-10-CM | POA: Diagnosis not present

## 2018-08-15 DIAGNOSIS — I6350 Cerebral infarction due to unspecified occlusion or stenosis of unspecified cerebral artery: Secondary | ICD-10-CM | POA: Diagnosis not present

## 2018-08-15 LAB — POCT INR
INR: 3.5 — AB (ref 2.0–3.0)
INR: 3.9 — AB (ref 2.0–3.0)

## 2018-08-15 NOTE — Patient Instructions (Signed)
Description   Spoke with pt and instructed pt to continue taking 5mg  daily except 7.5mg  on Mondays. Resume normal leafy intake.  Recheck in 5 weeks. [facility] [phone]

## 2018-08-26 ENCOUNTER — Encounter: Payer: Self-pay | Admitting: Internal Medicine

## 2018-08-26 ENCOUNTER — Other Ambulatory Visit (INDEPENDENT_AMBULATORY_CARE_PROVIDER_SITE_OTHER): Payer: Medicare Other

## 2018-08-26 ENCOUNTER — Other Ambulatory Visit: Payer: Self-pay

## 2018-08-26 ENCOUNTER — Ambulatory Visit (INDEPENDENT_AMBULATORY_CARE_PROVIDER_SITE_OTHER): Payer: Medicare Other | Admitting: Internal Medicine

## 2018-08-26 VITALS — BP 148/76 | HR 75 | Temp 98.0°F | Ht 70.0 in | Wt 171.0 lb

## 2018-08-26 DIAGNOSIS — I4891 Unspecified atrial fibrillation: Secondary | ICD-10-CM

## 2018-08-26 DIAGNOSIS — E1159 Type 2 diabetes mellitus with other circulatory complications: Secondary | ICD-10-CM | POA: Diagnosis not present

## 2018-08-26 DIAGNOSIS — I10 Essential (primary) hypertension: Secondary | ICD-10-CM | POA: Diagnosis not present

## 2018-08-26 DIAGNOSIS — Z7901 Long term (current) use of anticoagulants: Secondary | ICD-10-CM

## 2018-08-26 DIAGNOSIS — D038 Melanoma in situ of other sites: Secondary | ICD-10-CM

## 2018-08-26 DIAGNOSIS — K7460 Unspecified cirrhosis of liver: Secondary | ICD-10-CM | POA: Diagnosis not present

## 2018-08-26 DIAGNOSIS — E785 Hyperlipidemia, unspecified: Secondary | ICD-10-CM

## 2018-08-26 DIAGNOSIS — D039 Melanoma in situ, unspecified: Secondary | ICD-10-CM | POA: Insufficient documentation

## 2018-08-26 LAB — BASIC METABOLIC PANEL
BUN: 24 mg/dL — ABNORMAL HIGH (ref 6–23)
CO2: 26 mEq/L (ref 19–32)
Calcium: 9.8 mg/dL (ref 8.4–10.5)
Chloride: 99 mEq/L (ref 96–112)
Creatinine, Ser: 1.23 mg/dL (ref 0.40–1.50)
GFR: 56.18 mL/min — ABNORMAL LOW (ref >60.00)
Glucose, Bld: 127 mg/dL — ABNORMAL HIGH (ref 70–99)
Potassium: 4.5 mEq/L (ref 3.5–5.1)
Sodium: 135 mEq/L (ref 135–145)

## 2018-08-26 LAB — HEPATIC FUNCTION PANEL
ALT: 20 U/L (ref 0–53)
AST: 28 U/L (ref 0–37)
Albumin: 4.5 g/dL (ref 3.5–5.2)
Alkaline Phosphatase: 86 U/L (ref 39–117)
Bilirubin, Direct: 0.2 mg/dL (ref 0.0–0.3)
Total Bilirubin: 0.8 mg/dL (ref 0.2–1.2)
Total Protein: 7.8 g/dL (ref 6.0–8.3)

## 2018-08-26 LAB — AMMONIA: Ammonia: 32 umol/L (ref 11–35)

## 2018-08-26 LAB — TSH: TSH: 8.34 u[IU]/mL — ABNORMAL HIGH (ref 0.35–4.50)

## 2018-08-26 LAB — HEMOGLOBIN A1C: Hgb A1c MFr Bld: 6 % (ref 4.6–6.5)

## 2018-08-26 NOTE — Assessment & Plan Note (Signed)
Lipitor 

## 2018-08-26 NOTE — Assessment & Plan Note (Signed)
Bisoprolol, Diltiazem, ASA, Coumadin

## 2018-08-26 NOTE — Assessment & Plan Note (Addendum)
Labs ASA, Lipitor

## 2018-08-26 NOTE — Assessment & Plan Note (Signed)
?  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

## 2018-08-26 NOTE — Assessment & Plan Note (Signed)
On Diltiazem, Bisoprolol, Enalapril, Furosemide

## 2018-08-26 NOTE — Assessment & Plan Note (Signed)
Coumadin 

## 2018-08-26 NOTE — Progress Notes (Signed)
Subjective:  Patient ID: Anthony Gould, male    DOB: 1933/04/30  Age: 85 y.o. MRN: [id number]  CC: No chief complaint on file.   HPI Anthony Gould presents for HTN, A fib, cirrhosis f/u Dr Denna Haggard bx melanoma "stage 0" L scapula  Outpatient Medications Prior to Visit  Medication Sig Dispense Refill  . allopurinol (ZYLOPRIM) 300 MG tablet Take 1 tablet (300 mg total) by mouth daily as needed. 90 tablet 3  . amoxicillin (AMOXIL) 500 MG capsule Take 4 capsules (2,000 mg total) by mouth daily as needed (Take 1 hour prior to dental work). 20 capsule 3  . aspirin 81 MG EC tablet Take 81 mg by mouth daily.      [name] atorvastatin (LIPITOR) 20 MG tablet TAKE 1 TABLET BY MOUTH TWO TIMES A WEEK 26 tablet 0  . bisoprolol (ZEBETA) 5 MG tablet Take 0.5 tablets (2.5 mg total) by mouth daily. 45 tablet 3  . celecoxib (CELEBREX) 200 MG capsule Take 1 capsule (200 mg total) by mouth 2 (two) times daily. 60 capsule 2  . diltiazem (CARDIZEM CD) 240 MG 24 hr capsule TAKE ONE CAPSULE BY MOUTH DAILY. GENERIC REPLACING CARDIZEM CD 90 capsule 0  . dipyridamole (PERSANTINE) 50 MG tablet TAKE 1 TABLET BY MOUTH THREE TIMES DAILY 270 tablet 2  . enalapril (VASOTEC) 20 MG tablet Take 1 tablet (20 mg total) by mouth daily. 90 tablet 3  . furosemide (LASIX) 40 MG tablet Take 1 tablet (40 mg total) by mouth daily. 90 tablet 3  . latanoprost (XALATAN) 0.005 % ophthalmic solution Place 1 drop into both eyes at bedtime.    . Milk Thistle 1000 MG CAPS Take 1 capsule by mouth daily.    . pantoprazole (PROTONIX) 40 MG tablet TAKE 1 TABLET BY MOUTH DAILY 90 tablet 3  . promethazine (PHENERGAN) 25 MG tablet Take 1 tablet (25 mg total) by mouth every 8 (eight) hours as needed for nausea or vomiting. 20 tablet 1  . warfarin (COUMADIN) 5 MG tablet TAKE BY MOUTH AS DIRECTED BY THE ANTICOAGULATION CLINIC. GENERIC EQUIVALENT FOR COUMADIN 135 tablet 1   No facility-administered medications prior to visit.     ROS: Review of  Systems  Constitutional: Negative for appetite change, fatigue and unexpected weight change.  HENT: Negative for congestion, nosebleeds, sneezing, sore throat and trouble swallowing.   Eyes: Negative for itching and visual disturbance.  Respiratory: Negative for cough.   Cardiovascular: Negative for chest pain, palpitations and leg swelling.  Gastrointestinal: Negative for abdominal distention, blood in stool, diarrhea and nausea.  Genitourinary: Negative for frequency and hematuria.  Musculoskeletal: Negative for back pain, gait problem, joint swelling and neck pain.  Skin: Positive for color change. Negative for rash.  Neurological: Negative for dizziness, tremors, speech difficulty and weakness.  Psychiatric/Behavioral: Negative for agitation, dysphoric mood and sleep disturbance. The patient is not nervous/anxious.     Objective:  BP (!) 148/76 (BP Location: Left Arm, Patient Position: Sitting, Cuff Size: Normal)   Pulse 75   Temp 98 F (36.7 C) (Oral)   Ht 5\' 10"  (1.778 m)   Wt 171 lb (77.6 kg)   SpO2 98%   BMI 24.54 kg/m   BP Readings from Last 3 Encounters:  08/26/18 (!) 148/76  04/23/18 (!) 146/74  12/25/17 (!) 144/76    Wt Readings from Last 3 Encounters:  08/26/18 171 lb (77.6 kg)  04/23/18 172 lb (78 kg)  12/25/17 173 lb (78.5 kg)    Physical  Exam Constitutional:      General: He is not in acute distress.    Appearance: He is well-developed.     Comments: NAD  Eyes:     Conjunctiva/sclera: Conjunctivae normal.     Pupils: Pupils are equal, round, and reactive to light.  Neck:     Musculoskeletal: Normal range of motion.     Thyroid: No thyromegaly.     Vascular: No JVD.  Cardiovascular:     Rate and Rhythm: Normal rate and regular rhythm.     Heart sounds: Normal heart sounds. No murmur. No friction rub. No gallop.   Pulmonary:     Effort: Pulmonary effort is normal. No respiratory distress.     Breath sounds: Normal breath sounds. No wheezing or  rales.  Chest:     Chest wall: No tenderness.  Abdominal:     General: Bowel sounds are normal. There is no distension.     Palpations: Abdomen is soft. There is no mass.     Tenderness: There is no abdominal tenderness. There is no guarding or rebound.  Musculoskeletal: Normal range of motion.        General: No tenderness.  Lymphadenopathy:     Cervical: No cervical adenopathy.  Skin:    General: Skin is warm and dry.     Findings: No rash.  Neurological:     Mental Status: He is alert and oriented to person, place, and time.     Cranial Nerves: No cranial nerve deficit.     Motor: No abnormal muscle tone.     Coordination: Coordination normal.     Gait: Gait normal.     Deep Tendon Reflexes: Reflexes are normal and symmetric.  Psychiatric:        Behavior: Behavior normal.        Thought Content: Thought content normal.        Judgment: Judgment normal.     Lab Results  Component Value Date   WBC 6.8 12/18/2017   HGB 12.9 (L) 12/18/2017   HCT 37.4 (L) 12/18/2017   PLT 336.0 12/18/2017   GLUCOSE 120 (H) 12/18/2017   CHOL 140 04/22/2017   TRIG 113.0 04/22/2017   HDL 48.50 04/22/2017   LDLCALC 69 04/22/2017   ALT 17 12/18/2017   AST 22 12/18/2017   NA 133 (L) 12/18/2017   K 4.5 12/18/2017   CL 98 12/18/2017   CREATININE 1.02 12/18/2017   BUN 15 12/18/2017   CO2 27 12/18/2017   TSH 5.84 (H) 12/18/2017   PSA 0.87 10/20/2014   INR 3.5 (A) 08/15/2018   HGBA1C 5.9 12/18/2017    US Abdomen Complete  Result Date: 12/26/2017 CLINICAL DATA:  Hematuria, night sweats, known hepatic cirrhosis. EXAM: ABDOMEN ULTRASOUND COMPLETE COMPARISON:  Abdominal and pelvic CT scan of September 22, 2017 FINDINGS: Gallbladder: The gallbladder surgically absent. Common bile duct: Diameter: 4 mm Liver: The hepatic echotexture is heterogeneously increased. The surface contour is mildly irregular. There is no focal mass or ductal dilation. Portal vein is patent on color Doppler imaging with normal  direction of blood flow towards the liver. IVC: No abnormality visualized. Pancreas: Bowel gas limits evaluation of the pancreatic head and tail. The pancreatic body is normal in appearance. Spleen: 11 cm in length.  The echotexture is normal. Right Kidney: Length: 11.2 cm. There is an area of increased echotexture in the upper pole of the right kidney which corresponds to a focus of fat on the previous CT scan. The echotexture elsewhere  the renal cortex is normal. There is no hydronephrosis. No stones are evident. Left Kidney: Length: 11.3 cm. Echogenicity within normal limits. No mass or hydronephrosis visualized. Abdominal aorta: There is mural plaque.  There is no aneurysm. Other findings: There is no ascites. IMPRESSION: Cirrhotic changes within the liver. No suspicious masses. Previous cholecystectomy. Limited visualization of the pancreas. Normal size spleen.  No ascites. 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 Electronically Signed   By: David  Martinique M.D.   On: 12/26/2017 08:50    Assessment & Plan:   There are no diagnoses linked to this encounter.   No orders of the defined types were placed in this encounter.    Follow-up: No follow-ups on file.  Walker Kehr, MD

## 2018-08-26 NOTE — Assessment & Plan Note (Signed)
L scapula Dr Denna Haggard - f/u w/Dr Denna Haggard

## 2018-08-27 ENCOUNTER — Other Ambulatory Visit (INDEPENDENT_AMBULATORY_CARE_PROVIDER_SITE_OTHER): Payer: Medicare Other

## 2018-08-27 ENCOUNTER — Other Ambulatory Visit: Payer: Medicare Other

## 2018-08-27 DIAGNOSIS — R946 Abnormal results of thyroid function studies: Secondary | ICD-10-CM | POA: Diagnosis not present

## 2018-08-27 LAB — T4, FREE: Free T4: 0.85 ng/dL (ref 0.60–1.60)

## 2018-08-27 LAB — AFP TUMOR MARKER: AFP-Tumor Marker: 2.4 ng/mL (ref <6.1)

## 2018-08-28 DIAGNOSIS — H40053 Ocular hypertension, bilateral: Secondary | ICD-10-CM | POA: Diagnosis not present

## 2018-08-28 DIAGNOSIS — H53413 Scotoma involving central area, bilateral: Secondary | ICD-10-CM | POA: Diagnosis not present

## 2018-09-15 ENCOUNTER — Telehealth: Payer: Self-pay

## 2018-09-15 NOTE — Telephone Encounter (Signed)
lmom for  Prescreen

## 2018-09-15 NOTE — Telephone Encounter (Signed)

## 2018-09-19 ENCOUNTER — Other Ambulatory Visit: Payer: Self-pay

## 2018-09-19 ENCOUNTER — Ambulatory Visit (INDEPENDENT_AMBULATORY_CARE_PROVIDER_SITE_OTHER): Payer: Medicare Other | Admitting: *Deleted

## 2018-09-19 DIAGNOSIS — I4891 Unspecified atrial fibrillation: Secondary | ICD-10-CM

## 2018-09-19 DIAGNOSIS — Z7901 Long term (current) use of anticoagulants: Secondary | ICD-10-CM

## 2018-09-19 DIAGNOSIS — Z9889 Other specified postprocedural states: Secondary | ICD-10-CM | POA: Diagnosis not present

## 2018-09-19 DIAGNOSIS — I059 Rheumatic mitral valve disease, unspecified: Secondary | ICD-10-CM

## 2018-09-19 DIAGNOSIS — I6350 Cerebral infarction due to unspecified occlusion or stenosis of unspecified cerebral artery: Secondary | ICD-10-CM

## 2018-09-19 DIAGNOSIS — G459 Transient cerebral ischemic attack, unspecified: Secondary | ICD-10-CM

## 2018-09-19 LAB — POCT INR: INR: 2.3 (ref 2.0–3.0)

## 2018-09-19 NOTE — Patient Instructions (Addendum)
Description   Today take 2.5mg  (an extra 1/2 tablet) and take 7.5mg  tomorrow then continue taking 5mg  daily except 7.5mg  on Mondays. Resume normal leafy intake.  Recheck in 3 weeks. [facility] [phone]

## 2018-09-22 ENCOUNTER — Other Ambulatory Visit: Payer: Self-pay | Admitting: Internal Medicine

## 2018-10-07 ENCOUNTER — Telehealth: Payer: Self-pay

## 2018-10-07 NOTE — Telephone Encounter (Signed)
lmom for prescreen  

## 2018-10-07 NOTE — Telephone Encounter (Signed)

## 2018-10-10 ENCOUNTER — Other Ambulatory Visit: Payer: Self-pay

## 2018-10-10 ENCOUNTER — Ambulatory Visit (INDEPENDENT_AMBULATORY_CARE_PROVIDER_SITE_OTHER): Payer: Medicare Other | Admitting: *Deleted

## 2018-10-10 DIAGNOSIS — Z7901 Long term (current) use of anticoagulants: Secondary | ICD-10-CM

## 2018-10-10 DIAGNOSIS — I6350 Cerebral infarction due to unspecified occlusion or stenosis of unspecified cerebral artery: Secondary | ICD-10-CM

## 2018-10-10 DIAGNOSIS — I059 Rheumatic mitral valve disease, unspecified: Secondary | ICD-10-CM

## 2018-10-10 DIAGNOSIS — Z9889 Other specified postprocedural states: Secondary | ICD-10-CM | POA: Diagnosis not present

## 2018-10-10 LAB — POCT INR: INR: 3.8 — AB (ref 2.0–3.0)

## 2018-10-10 NOTE — Patient Instructions (Signed)
Description   Take 1/2 a pill tomorrow, then continue taking 5mg  daily except 7.5mg  on Mondays. Recheck in 3 weeks. [facility] [phone]

## 2018-10-31 ENCOUNTER — Ambulatory Visit (INDEPENDENT_AMBULATORY_CARE_PROVIDER_SITE_OTHER): Payer: Medicare Other | Admitting: *Deleted

## 2018-10-31 ENCOUNTER — Other Ambulatory Visit: Payer: Self-pay

## 2018-10-31 DIAGNOSIS — Z7901 Long term (current) use of anticoagulants: Secondary | ICD-10-CM | POA: Diagnosis not present

## 2018-10-31 DIAGNOSIS — Z9889 Other specified postprocedural states: Secondary | ICD-10-CM

## 2018-10-31 DIAGNOSIS — I4891 Unspecified atrial fibrillation: Secondary | ICD-10-CM | POA: Diagnosis not present

## 2018-10-31 DIAGNOSIS — I6350 Cerebral infarction due to unspecified occlusion or stenosis of unspecified cerebral artery: Secondary | ICD-10-CM

## 2018-10-31 DIAGNOSIS — I059 Rheumatic mitral valve disease, unspecified: Secondary | ICD-10-CM | POA: Diagnosis not present

## 2018-10-31 DIAGNOSIS — G459 Transient cerebral ischemic attack, unspecified: Secondary | ICD-10-CM

## 2018-10-31 LAB — POCT INR: INR: 3.1 — AB (ref 2.0–3.0)

## 2018-10-31 NOTE — Patient Instructions (Signed)
Description   Continue taking 5mg  daily except 7.5mg  on Mondays. Recheck in 4 weeks. [facility] [phone]

## 2018-11-17 ENCOUNTER — Other Ambulatory Visit: Payer: Self-pay

## 2018-11-17 ENCOUNTER — Encounter: Payer: Self-pay | Admitting: Cardiovascular Disease

## 2018-11-17 ENCOUNTER — Encounter (INDEPENDENT_AMBULATORY_CARE_PROVIDER_SITE_OTHER): Payer: Self-pay

## 2018-11-17 ENCOUNTER — Ambulatory Visit (INDEPENDENT_AMBULATORY_CARE_PROVIDER_SITE_OTHER): Payer: Medicare Other | Admitting: Cardiovascular Disease

## 2018-11-17 VITALS — BP 150/60 | HR 68 | Ht 70.0 in | Wt 175.1 lb

## 2018-11-17 DIAGNOSIS — I10 Essential (primary) hypertension: Secondary | ICD-10-CM

## 2018-11-17 DIAGNOSIS — I4821 Permanent atrial fibrillation: Secondary | ICD-10-CM

## 2018-11-17 DIAGNOSIS — I059 Rheumatic mitral valve disease, unspecified: Secondary | ICD-10-CM | POA: Diagnosis not present

## 2018-11-17 NOTE — Patient Instructions (Signed)
Medication Instructions:  Your physician recommends that you continue on your current medications as directed. Please refer to the Current Medication list given to you today.  If you need a refill on your cardiac medications before your next appointment, please call your pharmacy.   Lab work: None Ordered  If you have labs (blood work) drawn today and your tests are completely normal, you will receive your results only by: [name] MyChart Message (if you have MyChart) OR . A paper copy in the mail If you have any lab test that is abnormal or we need to change your treatment, we will call you to review the results.  Testing/Procedures: Your physician has requested that you have an echocardiogram MARCH 2021. Echocardiography is a painless test that uses sound waves to create images of your heart. It provides your doctor with information about the size and shape of your heart and how well your heart's chambers and valves are working. This procedure takes approximately one hour. There are no restrictions for this procedure.  Follow-Up: At [facility], you and your health needs are our priority.  As part of our continuing mission to provide you with exceptional heart care, we have created designated Provider Care Teams.  These Care Teams include your primary Cardiologist (physician) and Advanced Practice Providers (APPs -  Physician Assistants and Nurse Practitioners) who all work together to provide you with the care you need, when you need it. . You will need a follow up appointment in 1 year.  Please call our office 2 months in advance to schedule this appointment.  You may see Darlina Guys, MD or one of the following Advanced Practice Providers on your designated Care Team:   . Lyda Jester, [name] . [name] Dunn, [name] . Ermalinda Barrios, [name]  Any Other Special Instructions Will Be Listed Below (If Applicable).

## 2018-11-17 NOTE — Progress Notes (Signed)
Chief Complaint  Patient presents with  . Follow-up    HTN    History of Present Illness: 85 yo male with history of severe mitral regurgitation s/p MVR, permanent atrial fibrillation, HTN, HLD, DM and prior TIA on warfarin who is here today for cardiac follow up. He has been followed by Dr. Saunders Revel. He is changing to my clinic today since Dr. Saunders Revel moved his practice to [place]. He had MVR in 1986. He has been on coumadin. His BP has been well controlled at home.   He is here today for follow up. The patient denies any chest pain, dyspnea, palpitations, lower extremity edema, orthopnea, PND, dizziness, near syncope or syncope. He is very active.   Primary Care Physician: Cassandria Anger, MD  Past Medical History:  Diagnosis Date  . Atrial fibrillation ([place])    last echo 11/22/06 - EF 50-55%, mild dilation LV, mild MR w/8mmHg gradient   . Blood transfusion without reported diagnosis   . Elevated TSH   . HTN (hypertension)   . Hyperlipidemia   . Mitral regurgitation    s/p MVR with [employer] Hall MVR 1986  . Normal nuclear stress test 12/25/01   no ischemia  . Peripheral edema    venous insufficiency  . TIA (transient ischemic attack)    while on coumadin  . Type II or unspecified type diabetes mellitus without mention of complication, not stated as uncontrolled    lifestyle mangement  . Ventricular ectopy    symptomatic    Past Surgical History:  Procedure Laterality Date  . CHOLECYSTECTOMY, LAPAROSCOPIC  2003  . MITRAL VALVE REPLACEMENT     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    Current Outpatient Medications  Medication Sig Dispense Refill  . allopurinol (ZYLOPRIM) 300 MG tablet Take 1 tablet (300 mg total) by mouth daily as needed. 90 tablet 3  . amoxicillin (AMOXIL) 500 MG capsule Take 4 capsules (2,000 mg total) by mouth daily as needed (Take 1 hour prior to dental work). 20 capsule 3  . aspirin 81 MG EC tablet Take 81 mg by mouth daily.      [name]  atorvastatin (LIPITOR) 20 MG tablet TAKE 1 TABLET BY MOUTH TWO TIMES A WEEK 26 tablet 1  . bisoprolol (ZEBETA) 5 MG tablet Take 0.5 tablets (2.5 mg total) by mouth daily. 45 tablet 3  . celecoxib (CELEBREX) 200 MG capsule Take 1 capsule (200 mg total) by mouth 2 (two) times daily. 60 capsule 2  . diltiazem (CARDIZEM CD) 240 MG 24 hr capsule TAKE ONE CAPSULE BY MOUTH DAILY 90 capsule 1  . dipyridamole (PERSANTINE) 50 MG tablet TAKE 1 TABLET BY MOUTH THREE TIMES DAILY 270 tablet 2  . enalapril (VASOTEC) 20 MG tablet Take 1 tablet (20 mg total) by mouth daily. 90 tablet 3  . furosemide (LASIX) 40 MG tablet Take 1 tablet (40 mg total) by mouth daily. 90 tablet 3  . latanoprost (XALATAN) 0.005 % ophthalmic solution Place 1 drop into both eyes at bedtime.    . Milk Thistle 1000 MG CAPS Take 1 capsule by mouth daily.    . pantoprazole (PROTONIX) 40 MG tablet TAKE 1 TABLET BY MOUTH DAILY 90 tablet 3  . promethazine (PHENERGAN) 25 MG tablet Take 1 tablet (25 mg total) by mouth every 8 (eight) hours as needed for nausea or vomiting. 20 tablet 1  . warfarin (COUMADIN) 5 MG tablet TAKE BY MOUTH AS DIRECTED BY THE ANTICOAGULATION CLINIC. GENERIC EQUIVALENT FOR  COUMADIN 135 tablet 1   No current facility-administered medications for this visit.     Allergies  Allergen Reactions  . Sulfonamide Derivatives     Unknown     Social History   Socioeconomic History  . Marital status: Married    Spouse name: [name]  . Number of children: 2  . Years of education: Not on file  . Highest education level: Not on file  Occupational History  . 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 Financial resource strain: Not on file  . Food insecurity    Worry: Not on file    Inability: Not on file  . Transportation needs    Medical: Not on file    Non-medical: Not on file  Tobacco Use  . Smoking status: Never Smoker  . Smokeless tobacco: Never Used  Substance and Sexual Activity   . Alcohol use: No    Alcohol/week: 0.0 standard drinks  . Drug use: No  . Sexual activity: Not on file  Lifestyle  . Physical activity    Days per week: Not on file    Minutes per session: Not on file  . Stress: Not on file  Relationships  . Social Herbalist on phone: Not on file    Gets together: Not on file    Attends religious service: Not on file    Active member of club or organization: Not on file    Attends meetings of clubs or organizations: Not on file    Relationship status: Not on file  . Intimate partner violence    Fear of current or ex partner: Not on file    Emotionally abused: Not on file    Physically abused: Not on file    Forced sexual activity: Not on file  Other Topics Concern  . Not on file  Social History Narrative   HSG, many management courses   Married '58   2 sons - '61, '62; 1 grandchild   Nonsmoker          Family History  Problem Relation Age of Onset  . Coronary artery disease Father   . Diabetes Other   . Coronary artery disease Other   . Prostate cancer Neg Hx   . Colon cancer Neg Hx     Review of Systems:  As stated in the HPI and otherwise negative.   BP (!) 150/60   Pulse 68   Ht 5\' 10"  (1.778 m)   Wt 175 lb 1.9 oz (79.4 kg)   SpO2 98%   BMI 25.13 kg/m   Physical Examination: General: Well developed, well nourished, NAD  HEENT: OP clear, mucus membranes moist  SKIN: warm, dry. No rashes. Neuro: No focal deficits  Musculoskeletal: Muscle strength 5/5 all ext  Psychiatric: Mood and affect normal  Neck: No JVD, no carotid bruits, no thyromegaly, no lymphadenopathy.  Lungs:Clear bilaterally, no wheezes, rhonci, crackles Cardiovascular: Irregular irregular No murmurs, gallops or rubs. Abdomen:Soft. Bowel sounds present. Non-tender.  Extremities: No lower extremity edema. Pulses are 2 + in the bilateral DP/PT.  EKG:  EKG is ordered today. The ekg ordered today demonstrates Atrial fib, rate 68 bpm. LBBB   Recent Labs: 12/18/2017: Hemoglobin 12.9; Platelets 336.0 08/26/2018: ALT 20; BUN 24; Creatinine, Ser 1.23; Potassium 4.5; Sodium 135; TSH 8.34   Lipid Panel    Component Value Date/Time   CHOL 140 04/22/2017 0905   TRIG 113.0 04/22/2017 0905   HDL  48.50 04/22/2017 0905   CHOLHDL 3 04/22/2017 0905   VLDL 22.6 04/22/2017 0905   LDLCALC 69 04/22/2017 0905     Wt Readings from Last 3 Encounters:  11/17/18 175 lb 1.9 oz (79.4 kg)  08/26/18 171 lb (77.6 kg)  04/23/18 172 lb (78 kg)     Other studies Reviewed: Additional studies/ records that were reviewed today include:  Review of the above records demonstrates:    Assessment and Plan:   1. Persistent atrial fibrillation: Rate controlled. Continue Cardizem and bisoprolol. Continue warfarin.   2. Severe mitral regurgitation: He is s/p mechanical mitral valve replacement. Continue ASA and coumadin. He will use SBE prophylaxis as needed prior to dental procedures. Will repeat echo March 2021.   3. HTN: BP controlled. No changes  4. HLD: Lipids followed in primary care. Continue statin.   Current medicines are reviewed at length with the patient today.  The patient does not have concerns regarding medicines.  The following changes have been made:  no change  Labs/ tests ordered today include:   Orders Placed This Encounter  Procedures  . EKG 12-Lead  . ECHOCARDIOGRAM COMPLETE    Disposition:   FU with me in 12 months.    Signed, Lauree Chandler, MD 11/17/2018 4:43 PM    [facility] [place], [place], [place]  [address] Phone: [phone]; Fax: [phone]

## 2018-11-26 ENCOUNTER — Encounter: Payer: Self-pay | Admitting: Internal Medicine

## 2018-11-26 ENCOUNTER — Ambulatory Visit (INDEPENDENT_AMBULATORY_CARE_PROVIDER_SITE_OTHER): Payer: Medicare Other | Admitting: Internal Medicine

## 2018-11-26 ENCOUNTER — Other Ambulatory Visit: Payer: Self-pay

## 2018-11-26 ENCOUNTER — Other Ambulatory Visit (INDEPENDENT_AMBULATORY_CARE_PROVIDER_SITE_OTHER): Payer: Medicare Other

## 2018-11-26 VITALS — BP 160/80 | HR 71 | Temp 98.2°F | Ht 70.0 in | Wt 172.0 lb

## 2018-11-26 DIAGNOSIS — E559 Vitamin D deficiency, unspecified: Secondary | ICD-10-CM | POA: Diagnosis not present

## 2018-11-26 DIAGNOSIS — M545 Low back pain, unspecified: Secondary | ICD-10-CM | POA: Insufficient documentation

## 2018-11-26 DIAGNOSIS — I2510 Atherosclerotic heart disease of native coronary artery without angina pectoris: Secondary | ICD-10-CM

## 2018-11-26 DIAGNOSIS — K7460 Unspecified cirrhosis of liver: Secondary | ICD-10-CM | POA: Diagnosis not present

## 2018-11-26 DIAGNOSIS — I10 Essential (primary) hypertension: Secondary | ICD-10-CM

## 2018-11-26 DIAGNOSIS — I5032 Chronic diastolic (congestive) heart failure: Secondary | ICD-10-CM | POA: Diagnosis not present

## 2018-11-26 DIAGNOSIS — C4490 Unspecified malignant neoplasm of skin, unspecified: Secondary | ICD-10-CM

## 2018-11-26 DIAGNOSIS — Z23 Encounter for immunization: Secondary | ICD-10-CM

## 2018-11-26 DIAGNOSIS — G8929 Other chronic pain: Secondary | ICD-10-CM

## 2018-11-26 DIAGNOSIS — E1159 Type 2 diabetes mellitus with other circulatory complications: Secondary | ICD-10-CM

## 2018-11-26 DIAGNOSIS — E785 Hyperlipidemia, unspecified: Secondary | ICD-10-CM

## 2018-11-26 LAB — BASIC METABOLIC PANEL
BUN: 24 mg/dL — ABNORMAL HIGH (ref 6–23)
CO2: 26 mEq/L (ref 19–32)
Calcium: 9.5 mg/dL (ref 8.4–10.5)
Chloride: 104 mEq/L (ref 96–112)
Creatinine, Ser: 1.2 mg/dL (ref 0.40–1.50)
GFR: 57.77 mL/min — ABNORMAL LOW (ref >60.00)
Glucose, Bld: 111 mg/dL — ABNORMAL HIGH (ref 70–99)
Potassium: 4 mEq/L (ref 3.5–5.1)
Sodium: 138 mEq/L (ref 135–145)

## 2018-11-26 LAB — HEPATIC FUNCTION PANEL
ALT: 13 U/L (ref 0–53)
AST: 23 U/L (ref 0–37)
Albumin: 4.4 g/dL (ref 3.5–5.2)
Alkaline Phosphatase: 75 U/L (ref 39–117)
Bilirubin, Direct: 0.1 mg/dL (ref 0.0–0.3)
Total Bilirubin: 0.8 mg/dL (ref 0.2–1.2)
Total Protein: 7.4 g/dL (ref 6.0–8.3)

## 2018-11-26 LAB — HEMOGLOBIN A1C: Hgb A1c MFr Bld: 5.9 % (ref 4.6–6.5)

## 2018-11-26 LAB — TSH: TSH: 7.68 u[IU]/mL — ABNORMAL HIGH (ref 0.35–4.50)

## 2018-11-26 LAB — VITAMIN D 25 HYDROXY (VIT D DEFICIENCY, FRACTURES): VITD: 25.24 ng/mL — ABNORMAL LOW (ref 30.00–100.00)

## 2018-11-26 NOTE — Assessment & Plan Note (Signed)
nose - advised to be removed by Derm

## 2018-11-26 NOTE — Assessment & Plan Note (Signed)
ASA, diltiazem, Lipitor, Bisoprolol

## 2018-11-26 NOTE — Assessment & Plan Note (Signed)
A1c

## 2018-11-26 NOTE — Assessment & Plan Note (Signed)
On Diltiazem, Bisoprolol, Enalapril, Furosemide  

## 2018-11-26 NOTE — Assessment & Plan Note (Signed)
Labs F/u w/Dr Carlean Purl

## 2018-11-26 NOTE — Assessment & Plan Note (Signed)
Better  - used Methocarbamol prn

## 2018-11-26 NOTE — Progress Notes (Signed)
Subjective:  Patient ID: Anthony Gould, male    DOB: [birth year]  Age: 85 y.o. MRN: [id number]  CC: No chief complaint on file.   HPI Anthony Gould presents for HTN, PVD, dyslipidemia f/u SBP 130 s at home  Outpatient Medications Prior to Visit  Medication Sig Dispense Refill  . allopurinol (ZYLOPRIM) 300 MG tablet Take 1 tablet (300 mg total) by mouth daily as needed. 90 tablet 3  . amoxicillin (AMOXIL) 500 MG capsule Take 4 capsules (2,000 mg total) by mouth daily as needed (Take 1 hour prior to dental work). 20 capsule 3  . aspirin 81 MG EC tablet Take 81 mg by mouth daily.      [name] atorvastatin (LIPITOR) 20 MG tablet TAKE 1 TABLET BY MOUTH TWO TIMES A WEEK 26 tablet 1  . bisoprolol (ZEBETA) 5 MG tablet Take 0.5 tablets (2.5 mg total) by mouth daily. 45 tablet 3  . celecoxib (CELEBREX) 200 MG capsule Take 1 capsule (200 mg total) by mouth 2 (two) times daily. 60 capsule 2  . diltiazem (CARDIZEM CD) 240 MG 24 hr capsule TAKE ONE CAPSULE BY MOUTH DAILY 90 capsule 1  . dipyridamole (PERSANTINE) 50 MG tablet TAKE 1 TABLET BY MOUTH THREE TIMES DAILY 270 tablet 2  . enalapril (VASOTEC) 20 MG tablet Take 1 tablet (20 mg total) by mouth daily. 90 tablet 3  . furosemide (LASIX) 40 MG tablet Take 1 tablet (40 mg total) by mouth daily. 90 tablet 3  . latanoprost (XALATAN) 0.005 % ophthalmic solution Place 1 drop into both eyes at bedtime.    . Milk Thistle 1000 MG CAPS Take 1 capsule by mouth daily.    . pantoprazole (PROTONIX) 40 MG tablet TAKE 1 TABLET BY MOUTH DAILY 90 tablet 3  . promethazine (PHENERGAN) 25 MG tablet Take 1 tablet (25 mg total) by mouth every 8 (eight) hours as needed for nausea or vomiting. 20 tablet 1  . warfarin (COUMADIN) 5 MG tablet TAKE BY MOUTH AS DIRECTED BY THE ANTICOAGULATION CLINIC. GENERIC EQUIVALENT FOR COUMADIN 135 tablet 1   No facility-administered medications prior to visit.     ROS: Review of Systems  Constitutional: Negative for appetite change,  fatigue and unexpected weight change.  HENT: Negative for congestion, nosebleeds, sneezing, sore throat and trouble swallowing.   Eyes: Negative for itching and visual disturbance.  Respiratory: Negative for cough.   Cardiovascular: Negative for chest pain, palpitations and leg swelling.  Gastrointestinal: Negative for abdominal distention, blood in stool, diarrhea and nausea.  Genitourinary: Negative for frequency and hematuria.  Musculoskeletal: Negative for back pain, gait problem, joint swelling and neck pain.  Skin: Negative for rash.  Neurological: Negative for dizziness, tremors, speech difficulty and weakness.  Psychiatric/Behavioral: Negative for agitation, dysphoric mood, sleep disturbance and suicidal ideas. The patient is not nervous/anxious.     Objective:  BP (!) 160/80 (BP Location: Left Arm, Patient Position: Sitting, Cuff Size: Normal)   Pulse 71   Temp 98.2 F (36.8 C) (Oral)   Ht 5\' 10"  (1.778 m)   Wt 172 lb (78 kg)   SpO2 98%   BMI 24.68 kg/m   BP Readings from Last 3 Encounters:  11/26/18 (!) 160/80  11/17/18 (!) 150/60  08/26/18 (!) 148/76    Wt Readings from Last 3 Encounters:  11/26/18 172 lb (78 kg)  11/17/18 175 lb 1.9 oz (79.4 kg)  08/26/18 171 lb (77.6 kg)    Physical Exam Constitutional:      General:  He is not in acute distress.    Appearance: He is well-developed.     Comments: NAD  Eyes:     Conjunctiva/sclera: Conjunctivae normal.     Pupils: Pupils are equal, round, and reactive to light.  Neck:     Musculoskeletal: Normal range of motion.     Thyroid: No thyromegaly.     Vascular: No JVD.  Cardiovascular:     Rate and Rhythm: Normal rate and regular rhythm.     Heart sounds: Normal heart sounds. No murmur. No friction rub. No gallop.   Pulmonary:     Effort: Pulmonary effort is normal. No respiratory distress.     Breath sounds: Normal breath sounds. No wheezing or rales.  Chest:     Chest wall: No tenderness.  Abdominal:      General: Bowel sounds are normal. There is no distension.     Palpations: Abdomen is soft. There is no mass.     Tenderness: There is no abdominal tenderness. There is no guarding or rebound.  Musculoskeletal: Normal range of motion.        General: No tenderness.  Lymphadenopathy:     Cervical: No cervical adenopathy.  Skin:    General: Skin is warm and dry.     Findings: No rash.  Neurological:     Mental Status: He is alert and oriented to person, place, and time.     Cranial Nerves: No cranial nerve deficit.     Motor: No abnormal muscle tone.     Coordination: Coordination normal.     Gait: Gait normal.     Deep Tendon Reflexes: Reflexes are normal and symmetric.  Psychiatric:        Behavior: Behavior normal.        Thought Content: Thought content normal.        Judgment: Judgment normal.   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 mm  Lab Results  Component Value Date   WBC 6.8 12/18/2017   HGB 12.9 (L) 12/18/2017   HCT 37.4 (L) 12/18/2017   PLT 336.0 12/18/2017   GLUCOSE 127 (H) 08/26/2018   CHOL 140 04/22/2017   TRIG 113.0 04/22/2017   HDL 48.50 04/22/2017   LDLCALC 69 04/22/2017   ALT 20 08/26/2018   AST 28 08/26/2018   NA 135 08/26/2018   K 4.5 08/26/2018   CL 99 08/26/2018   CREATININE 1.23 08/26/2018   BUN 24 (H) 08/26/2018   CO2 26 08/26/2018   TSH 8.34 (H) 08/26/2018   PSA 0.87 10/20/2014   INR 3.1 (A) 10/31/2018   HGBA1C 6.0 08/26/2018    US Abdomen Complete  Result Date: 12/26/2017 CLINICAL DATA:  Hematuria, night sweats, known hepatic cirrhosis. EXAM: ABDOMEN ULTRASOUND COMPLETE COMPARISON:  Abdominal and pelvic CT scan of September 22, 2017 FINDINGS: Gallbladder: The gallbladder surgically absent. Common bile duct: Diameter: 4 mm Liver: The hepatic echotexture is heterogeneously increased. The surface contour is mildly irregular. There is no focal mass or ductal dilation. Portal vein is patent on color Doppler imaging with normal direction of blood flow towards the liver.  IVC: No abnormality visualized. Pancreas: Bowel gas limits evaluation of the pancreatic head and tail. The pancreatic body is normal in appearance. Spleen: 11 cm in length.  The echotexture is normal. Right Kidney: Length: 11.2 cm. There is an area of increased echotexture in the upper pole of the right kidney which corresponds to a focus of fat on the previous CT scan. The echotexture elsewhere the renal cortex is  normal. There is no hydronephrosis. No stones are evident. Left Kidney: Length: 11.3 cm. Echogenicity within normal limits. No mass or hydronephrosis visualized. Abdominal aorta: There is mural plaque.  There is no aneurysm. Other findings: There is no ascites. IMPRESSION: Cirrhotic changes within the liver. No suspicious masses. Previous cholecystectomy. Limited visualization of the pancreas. Normal size spleen.  No ascites. 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 Electronically Signed   By: David  Martinique M.D.   On: 12/26/2017 08:50    Assessment & Plan:   Diagnoses and all orders for this visit:  Need for influenza vaccination -     Flu Vaccine QUAD High Dose(Fluad)     No orders of the defined types were placed in this encounter.    Follow-up: No follow-ups on file.  Walker Kehr, MD

## 2018-11-26 NOTE — Assessment & Plan Note (Signed)
Doing well 

## 2018-11-26 NOTE — Assessment & Plan Note (Signed)
Lipitor 

## 2018-11-28 ENCOUNTER — Other Ambulatory Visit: Payer: Self-pay | Admitting: Internal Medicine

## 2018-11-28 ENCOUNTER — Ambulatory Visit (INDEPENDENT_AMBULATORY_CARE_PROVIDER_SITE_OTHER): Payer: Medicare Other | Admitting: *Deleted

## 2018-11-28 ENCOUNTER — Other Ambulatory Visit: Payer: Self-pay

## 2018-11-28 DIAGNOSIS — I6350 Cerebral infarction due to unspecified occlusion or stenosis of unspecified cerebral artery: Secondary | ICD-10-CM | POA: Diagnosis not present

## 2018-11-28 DIAGNOSIS — Z7901 Long term (current) use of anticoagulants: Secondary | ICD-10-CM

## 2018-11-28 DIAGNOSIS — I059 Rheumatic mitral valve disease, unspecified: Secondary | ICD-10-CM

## 2018-11-28 DIAGNOSIS — Z9889 Other specified postprocedural states: Secondary | ICD-10-CM

## 2018-11-28 DIAGNOSIS — E559 Vitamin D deficiency, unspecified: Secondary | ICD-10-CM | POA: Insufficient documentation

## 2018-11-28 LAB — POCT INR: INR: 3.5 — AB (ref 2.0–3.0)

## 2018-11-28 MED ORDER — VITAMIN D3 1.25 MG (50000 UT) PO CAPS: 1.0000 | ORAL_CAPSULE | ORAL | 0 refills | Status: DC

## 2018-11-28 NOTE — Assessment & Plan Note (Signed)
Start vitamin D prescription

## 2018-11-28 NOTE — Patient Instructions (Signed)
Description   Continue taking 5mg  daily except 7.5mg  on Mondays. Recheck in 5 weeks. [facility] [phone]

## 2018-11-28 NOTE — Addendum Note (Signed)
Addended by: Cassandria Anger on: 11/28/2018 01:19 PM   Modules accepted: Orders

## 2019-01-02 ENCOUNTER — Other Ambulatory Visit: Payer: Self-pay

## 2019-01-02 ENCOUNTER — Ambulatory Visit: Payer: Medicare Other

## 2019-01-02 DIAGNOSIS — Z7901 Long term (current) use of anticoagulants: Secondary | ICD-10-CM

## 2019-01-02 DIAGNOSIS — I059 Rheumatic mitral valve disease, unspecified: Secondary | ICD-10-CM | POA: Diagnosis not present

## 2019-01-02 DIAGNOSIS — Z9889 Other specified postprocedural states: Secondary | ICD-10-CM | POA: Diagnosis not present

## 2019-01-02 DIAGNOSIS — G459 Transient cerebral ischemic attack, unspecified: Secondary | ICD-10-CM

## 2019-01-02 DIAGNOSIS — I6350 Cerebral infarction due to unspecified occlusion or stenosis of unspecified cerebral artery: Secondary | ICD-10-CM | POA: Diagnosis not present

## 2019-01-02 DIAGNOSIS — I4891 Unspecified atrial fibrillation: Secondary | ICD-10-CM | POA: Diagnosis not present

## 2019-01-02 LAB — POCT INR: INR: 3.5 — AB (ref 2.0–3.0)

## 2019-01-02 NOTE — Patient Instructions (Signed)
Description   Continue taking 5mg  daily except 7.5mg  on Mondays. Recheck in 6 weeks. [facility] [phone]

## 2019-01-05 ENCOUNTER — Other Ambulatory Visit: Payer: Self-pay | Admitting: Cardiovascular Disease

## 2019-01-06 DIAGNOSIS — H3562 Retinal hemorrhage, left eye: Secondary | ICD-10-CM | POA: Diagnosis not present

## 2019-01-06 DIAGNOSIS — H35033 Hypertensive retinopathy, bilateral: Secondary | ICD-10-CM | POA: Diagnosis not present

## 2019-01-06 DIAGNOSIS — H26493 Other secondary cataract, bilateral: Secondary | ICD-10-CM | POA: Diagnosis not present

## 2019-01-06 DIAGNOSIS — H40053 Ocular hypertension, bilateral: Secondary | ICD-10-CM | POA: Diagnosis not present

## 2019-01-09 ENCOUNTER — Telehealth: Payer: Self-pay

## 2019-01-09 NOTE — Telephone Encounter (Signed)
I started a Dipyridamole tier exception through covermymeds. Key: [id number]

## 2019-01-15 NOTE — Telephone Encounter (Signed)
**Note De-Identified Maram Bently Obfuscation** Letter received from [facility] stating that they have approved the pts Dipyridamole tier exception. Approval good until 01/09/2020 [id number]  The letter states that they have notified the pt of this approval an I have notified the pts pharmacy.

## 2019-01-19 ENCOUNTER — Other Ambulatory Visit (INDEPENDENT_AMBULATORY_CARE_PROVIDER_SITE_OTHER): Payer: Medicare Other

## 2019-01-19 DIAGNOSIS — E559 Vitamin D deficiency, unspecified: Secondary | ICD-10-CM | POA: Diagnosis not present

## 2019-01-19 DIAGNOSIS — I5032 Chronic diastolic (congestive) heart failure: Secondary | ICD-10-CM | POA: Diagnosis not present

## 2019-01-19 LAB — T4, FREE: Free T4: 0.98 ng/dL (ref 0.60–1.60)

## 2019-01-19 LAB — TSH: TSH: 8.45 u[IU]/mL — ABNORMAL HIGH (ref 0.35–4.50)

## 2019-01-19 LAB — VITAMIN D 25 HYDROXY (VIT D DEFICIENCY, FRACTURES): VITD: 54.07 ng/mL (ref 30.00–100.00)

## 2019-01-20 ENCOUNTER — Encounter: Payer: Self-pay | Admitting: Internal Medicine

## 2019-01-20 ENCOUNTER — Ambulatory Visit (INDEPENDENT_AMBULATORY_CARE_PROVIDER_SITE_OTHER): Payer: Medicare Other | Admitting: Internal Medicine

## 2019-01-20 ENCOUNTER — Other Ambulatory Visit: Payer: Self-pay

## 2019-01-20 ENCOUNTER — Ambulatory Visit: Payer: Self-pay

## 2019-01-20 DIAGNOSIS — R1011 Right upper quadrant pain: Secondary | ICD-10-CM

## 2019-01-20 DIAGNOSIS — R1084 Generalized abdominal pain: Secondary | ICD-10-CM | POA: Diagnosis not present

## 2019-01-20 DIAGNOSIS — R109 Unspecified abdominal pain: Secondary | ICD-10-CM | POA: Insufficient documentation

## 2019-01-20 NOTE — Assessment & Plan Note (Signed)
Given new dark stools, nausea, fever, complicated history this cannot be adequately assessed via telephone and he is asked to go to ER for labs, exam, imaging to isolate source. Needs CBC or hematocrit given multiple medications which can cause GI bleeding.

## 2019-01-20 NOTE — Progress Notes (Signed)
Virtual Visit via Audio Note  I connected with Anthony Gould on 01/20/19 at  3:00 PM EDT by an audio-only enabled telemedicine application and verified that I am speaking with the correct person using two identifiers.  The patient and the provider were at separate locations throughout the entire encounter.   I discussed the limitations of evaluation and management by telemedicine and the availability of in person appointments. The patient expressed understanding and agreed to proceed. The patient and the provider were the only parties present for the visit unless noted in HPI below.  History of Present Illness: The patient is a 85 y.o. man with visit for nausea and stomach pain. Started about 1-2 days ago around 5 AM yesterday morning. Has pain in the middle right side near navel and also in the back similar height. Denies fevers today but told our triage that he had 99.46F last night. Overall it is constant about 3/10 pain. Has tried tylenol for the pain when he had the fever which helped the back but not stomach. Does have known cirrhosis. Very little gas since onset but last BM this morning. Does feel constipated in the last week or two with only small and hard BMs. Has not tried anything for constipation. Feels his stool is darker than normal. Does take warfarin, has celebrex on list and states taking and dipyridamole.   Observations/Objective: Voice strong, A and O times 3, no coughing or dyspnea  Assessment and Plan: See problem oriented charting  Follow Up Instructions: go to ER for further evaluation given the fevers and nausea we cannot see in office and I feel he needs in person evaluation  Visit time 11 minutes: that time was spent in face to face counseling and coordination of care with the patient: counseled about as above  I discussed the assessment and treatment plan with the patient. The patient was provided an opportunity to ask questions and all were answered. The patient  agreed with the plan and demonstrated an understanding of the instructions.   The patient was advised to call back or seek an in-person evaluation if the symptoms worsen or if the condition fails to improve as anticipated.  Hoyt Koch, MD

## 2019-01-20 NOTE — Telephone Encounter (Signed)
Patient called stating that he has appointment tomorrow with Dr Alain Marion but feels his nausea and abdominal pain is worse and would like see someone today f today.  He rates his abdominal pain as dull 3-5 range he states it to the right of his navel. He states that it goes into his lower back. He has nausea. No vomiting.  He had low grade fever 99.5 took one tylenol and it has not returned. He has had a small BM today but it was hard. He feels he may be constipated. Care advice read to patient. He verbalized understanding. Call was transferred to office for scheduling appointment today.  Reason for Disposition . [1] MILD-MODERATE pain AND [2] constant AND [3] present > 2 hours  Answer Assessment - Initial Assessment Questions 1. LOCATION: "Where does it hurt?"      Rt side navel high 2. RADIATION: "Does the pain shoot anywhere else?" (e.g., chest, back)     Makes back hurt  3. ONSET: "When did the pain begin?" (Minutes, hours or days ago)      Yesterday morning 4. SUDDEN: "Gradual or sudden onset?"     sudden 5. PATTERN "Does the pain come and go, or is it constant?"    - If constant: "Is it getting better, staying the same, or worsening?"      (Note: Constant means the pain never goes away completely; most serious pain is constant and it progresses)     - If intermittent: "How long does it last?" "Do you have pain now?"     (Note: Intermittent means the pain goes away completely between bouts)     worse 6. SEVERITY: "How bad is the pain?"  (e.g., Scale 1-10; mild, moderate, or severe)    - MILD (1-3): doesn't interfere with normal activities, abdomen soft and not tender to touch     - MODERATE (4-7): interferes with normal activities or awakens from sleep, tender to touch     - SEVERE (8-10): excruciating pain, doubled over, unable to do any normal activities       Awakens from sleep moderate 3-5 dull pain 7. RECURRENT SYMPTOM: "Have you ever had this type of abdominal pain before?" If so,  ask: "When was the last time?" and "What happened that time?"      Unsure treated for nauseavomiting at night nothing 8. CAUSE: "What do you think is causing the abdominal pain?"    Unsure but is somewhat constipated 9. RELIEVING/AGGRAVATING FACTORS: "What makes it better or worse?" (e.g., movement, antacids, bowel movement)     nothing 10. OTHER SYMPTOMS: "Has there been any vomiting, diarrhea, constipation, or urine problems?"      constipation  Protocols used: ABDOMINAL PAIN - MALE-A-AH

## 2019-01-21 ENCOUNTER — Ambulatory Visit: Payer: Medicare Other | Admitting: Internal Medicine

## 2019-02-13 ENCOUNTER — Other Ambulatory Visit: Payer: Self-pay

## 2019-02-13 ENCOUNTER — Ambulatory Visit: Payer: Medicare Other | Admitting: Pharmacist

## 2019-02-13 DIAGNOSIS — I6350 Cerebral infarction due to unspecified occlusion or stenosis of unspecified cerebral artery: Secondary | ICD-10-CM | POA: Diagnosis not present

## 2019-02-13 DIAGNOSIS — Z9889 Other specified postprocedural states: Secondary | ICD-10-CM

## 2019-02-13 DIAGNOSIS — I059 Rheumatic mitral valve disease, unspecified: Secondary | ICD-10-CM

## 2019-02-13 DIAGNOSIS — I4891 Unspecified atrial fibrillation: Secondary | ICD-10-CM | POA: Diagnosis not present

## 2019-02-13 DIAGNOSIS — Z7901 Long term (current) use of anticoagulants: Secondary | ICD-10-CM | POA: Diagnosis not present

## 2019-02-13 DIAGNOSIS — G459 Transient cerebral ischemic attack, unspecified: Secondary | ICD-10-CM

## 2019-02-13 LAB — POCT INR: INR: 2.7 (ref 2.0–3.0)

## 2019-02-13 NOTE — Patient Instructions (Signed)
Take an extra 1/2 tablet tonight (total of 7.5mg ) then continue taking 5mg  daily except 7.5mg  on Mondays. Recheck in 4 weeks. [facility] [phone]

## 2019-03-04 ENCOUNTER — Ambulatory Visit (HOSPITAL_COMMUNITY)
Admission: EM | Admit: 2019-03-04 | Discharge: 2019-03-04 | Disposition: A | Discharge status: Home / Self Care | Payer: Medicare Other

## 2019-03-04 ENCOUNTER — Ambulatory Visit: Payer: Self-pay

## 2019-03-04 DIAGNOSIS — S51801A Unspecified open wound of right forearm, initial encounter: Secondary | ICD-10-CM | POA: Diagnosis not present

## 2019-03-04 DIAGNOSIS — W19XXXA Unspecified fall, initial encounter: Secondary | ICD-10-CM | POA: Diagnosis not present

## 2019-03-04 DIAGNOSIS — R55 Syncope and collapse: Secondary | ICD-10-CM | POA: Diagnosis not present

## 2019-03-04 DIAGNOSIS — T148XXA Other injury of unspecified body region, initial encounter: Secondary | ICD-10-CM | POA: Diagnosis not present

## 2019-03-04 NOTE — ED Notes (Signed)
Patient reports passed out on treadmill

## 2019-03-04 NOTE — Telephone Encounter (Signed)
Pt. Reports he fainted while walking on his treadmill at 11:00. Not witnessed. States he does not think he hit his head. Has a skin tear to his right arm. Alert and oriented now. Requests OV with his PCP. Reccommended ED. Refuses. No availability with his PCP per Demi. Pt. States he will go to UC.  Reason for Disposition . [1] Age > 50 years  AND [2] now alert and feels fine  Answer Assessment - Initial Assessment Questions 1. ONSET: "How long were you unconscious?" (minutes) "When did it happen?"     Unsure how long - happened at 11:00 2. CONTENT: "What happened during period of unconsciousness?" (e.g., seizure activity)      Unsure 3. MENTAL STATUS: "Alert and oriented now?" (oriented x 3 = name, month, location)      Yes 4. TRIGGER: "What do you think caused the fainting?" "What were you doing just before you fainted?"  (e.g., exercise, sudden standing up, prolonged standing)     Walking on the treadmill 5. RECURRENT SYMPTOM: "Have you ever passed out before?" If so, ask: "When was the last time?" and "What happened that time?"      No 6. INJURY: "Did you sustain any injury during the fall?"      Skin tear to right arm 7. CARDIAC SYMPTOMS: "Have you had any of the following symptoms: chest pain, difficulty breathing, palpitations?"     No 8. NEUROLOGIC SYMPTOMS: "Have you had any of the following symptoms: headache, numbness, vertigo, weakness?"     Some weakness 9. GI SYMPTOMS: "Have you had any of the following symptoms: abdominal pain, vomiting, diarrhea, blood in stools?"     No 10. OTHER SYMPTOMS: "Do you have any other symptoms?"       No 11. PREGNANCY: "Is there any chance you are pregnant?" "When was your last menstrual period?"       n/a  Protocols used: [facility]

## 2019-03-04 NOTE — ED Notes (Signed)
Patient is being discharged from the [facility] and sent to the Emergency Department via wheelchair by staff. Per Augusto Gamble, NP, patient is stable but in need of higher level of care due to syncopal episode. Patient is aware and verbalizes understanding of plan of care. There were no vitals filed for this visit.

## 2019-03-04 NOTE — Telephone Encounter (Signed)
FYI

## 2019-03-04 NOTE — ED Notes (Signed)
Patient reports he passed out on treadmill.  Bruising to right hand.  Reports abrasion to right forearm.  Reports he called pcp and was told to come to [facility].  Per medical record was told to go to ED.  Spoke to Augusto Gamble, NP.  Patient to go to ED now, patient agreeable

## 2019-03-05 ENCOUNTER — Encounter: Payer: Self-pay | Admitting: Internal Medicine

## 2019-03-05 ENCOUNTER — Other Ambulatory Visit: Payer: Self-pay

## 2019-03-05 ENCOUNTER — Other Ambulatory Visit (INDEPENDENT_AMBULATORY_CARE_PROVIDER_SITE_OTHER): Payer: Medicare Other

## 2019-03-05 ENCOUNTER — Ambulatory Visit (INDEPENDENT_AMBULATORY_CARE_PROVIDER_SITE_OTHER): Payer: Medicare Other | Admitting: Internal Medicine

## 2019-03-05 VITALS — BP 144/74 | HR 51 | Temp 98.0°F | Ht 70.0 in | Wt 175.0 lb

## 2019-03-05 DIAGNOSIS — I10 Essential (primary) hypertension: Secondary | ICD-10-CM | POA: Diagnosis not present

## 2019-03-05 DIAGNOSIS — I4821 Permanent atrial fibrillation: Secondary | ICD-10-CM | POA: Diagnosis not present

## 2019-03-05 DIAGNOSIS — R55 Syncope and collapse: Secondary | ICD-10-CM

## 2019-03-05 LAB — CBC WITH DIFFERENTIAL/PLATELET
Basophils Absolute: 0 10*3/uL (ref 0.0–0.1)
Basophils Relative: 0.7 % (ref 0.0–3.0)
Eosinophils Absolute: 0.5 10*3/uL (ref 0.0–0.7)
Eosinophils Relative: 6.2 % — ABNORMAL HIGH (ref 0.0–5.0)
HCT: 38.7 % — ABNORMAL LOW (ref 39.0–52.0)
Hemoglobin: 13 g/dL (ref 13.0–17.0)
Lymphocytes Relative: 18.6 % (ref 12.0–46.0)
Lymphs Abs: 1.4 10*3/uL (ref 0.7–4.0)
MCHC: 33.5 g/dL (ref 30.0–36.0)
MCV: 99.9 fl (ref 78.0–100.0)
Monocytes Absolute: 0.6 10*3/uL (ref 0.1–1.0)
Monocytes Relative: 7.8 % (ref 3.0–12.0)
Neutro Abs: 4.9 10*3/uL (ref 1.4–7.7)
Neutrophils Relative %: 66.7 % (ref 43.0–77.0)
Platelets: 211 10*3/uL (ref 150.0–400.0)
RBC: 3.87 Mil/uL — ABNORMAL LOW (ref 4.22–5.81)
RDW: 15.5 % (ref 11.5–15.5)
WBC: 7.4 10*3/uL (ref 4.0–10.5)

## 2019-03-05 LAB — TSH: TSH: 5.18 u[IU]/mL — ABNORMAL HIGH (ref 0.35–4.50)

## 2019-03-05 LAB — HEPATIC FUNCTION PANEL
ALT: 18 U/L (ref 0–53)
AST: 27 U/L (ref 0–37)
Albumin: 4.5 g/dL (ref 3.5–5.2)
Alkaline Phosphatase: 83 U/L (ref 39–117)
Bilirubin, Direct: 0.2 mg/dL (ref 0.0–0.3)
Total Bilirubin: 0.8 mg/dL (ref 0.2–1.2)
Total Protein: 7.5 g/dL (ref 6.0–8.3)

## 2019-03-05 LAB — CARDIAC PANEL
CK-MB: 2.7 ng/mL (ref 0.3–4.0)
Relative Index: 2.5 calc (ref 0.0–2.5)
Total CK: 106 U/L (ref 7–232)

## 2019-03-05 LAB — BASIC METABOLIC PANEL
BUN: 19 mg/dL (ref 6–23)
CO2: 27 mEq/L (ref 19–32)
Calcium: 9.3 mg/dL (ref 8.4–10.5)
Chloride: 102 mEq/L (ref 96–112)
Creatinine, Ser: 1.34 mg/dL (ref 0.40–1.50)
GFR: 50.83 mL/min — ABNORMAL LOW (ref >60.00)
Glucose, Bld: 150 mg/dL — ABNORMAL HIGH (ref 70–99)
Potassium: 4 mEq/L (ref 3.5–5.1)
Sodium: 139 mEq/L (ref 135–145)

## 2019-03-05 LAB — PROTIME-INR
INR: 3.5 ratio — ABNORMAL HIGH (ref 0.8–1.0)
Prothrombin Time: 40.1 s — ABNORMAL HIGH (ref 9.6–13.1)

## 2019-03-05 LAB — MAGNESIUM: Magnesium: 1.7 mg/dL (ref 1.5–2.5)

## 2019-03-05 LAB — T4, FREE: Free T4: 1 ng/dL (ref 0.60–1.60)

## 2019-03-05 MED ORDER — FUROSEMIDE 40 MG PO TABS: 20.0000 mg | ORAL_TABLET | Freq: Every day | ORAL | 3 refills | Status: DC

## 2019-03-05 NOTE — Patient Instructions (Addendum)
Reduce Furosemide to 20 mg a day

## 2019-03-05 NOTE — Telephone Encounter (Signed)
Noted. Thx.

## 2019-03-05 NOTE — Assessment & Plan Note (Signed)
Bisoprolol, Diltiazem, ASA, Coumadin

## 2019-03-05 NOTE — Assessment & Plan Note (Signed)
03/04/19 - new No driving Card consult ASAP Labs See BP drop w/standing - no sx's however

## 2019-03-05 NOTE — Progress Notes (Signed)
Subjective:  Patient ID: Anthony Gould, male    DOB: 10/03/1933  Age: 85 y.o. MRN: [id number]  CC: No chief complaint on file.   HPI [name] presents for a syncopal spell while walking on the treadmill yesterday at 11 o'clock; hit R side, R arm. LOC time -- unknown  Outpatient Medications Prior to Visit  Medication Sig Dispense Refill  . allopurinol (ZYLOPRIM) 300 MG tablet Take 1 tablet (300 mg total) by mouth daily as needed. 90 tablet 3  . aspirin 81 MG EC tablet Take 81 mg by mouth daily.      [name] atorvastatin (LIPITOR) 20 MG tablet TAKE 1 TABLET BY MOUTH TWO TIMES A WEEK 26 tablet 1  . bisoprolol (ZEBETA) 5 MG tablet Take 0.5 tablets (2.5 mg total) by mouth daily. 45 tablet 3  . celecoxib (CELEBREX) 200 MG capsule Take 1 capsule (200 mg total) by mouth 2 (two) times daily. 60 capsule 2  . Cholecalciferol (VITAMIN D3) 1.25 MG (50000 UT) CAPS Take 1 capsule by mouth once a week. 6 capsule 0  . diltiazem (CARDIZEM CD) 240 MG 24 hr capsule TAKE ONE CAPSULE BY MOUTH DAILY 90 capsule 1  . dipyridamole (PERSANTINE) 50 MG tablet TAKE 1 TABLET BY MOUTH THREE TIMES DAILY 270 tablet 2  . enalapril (VASOTEC) 20 MG tablet Take 1 tablet (20 mg total) by mouth daily. 90 tablet 3  . furosemide (LASIX) 40 MG tablet Take 1 tablet (40 mg total) by mouth daily. 90 tablet 3  . latanoprost (XALATAN) 0.005 % ophthalmic solution Place 1 drop into both eyes at bedtime.    . Milk Thistle 1000 MG CAPS Take 1 capsule by mouth daily.    . pantoprazole (PROTONIX) 40 MG tablet TAKE 1 TABLET BY MOUTH DAILY 90 tablet 3  . warfarin (COUMADIN) 5 MG tablet TAKE BY MOUTH AS DIRECTED BY THE ANTICOAGULATION CLINIC. GENERIC EQUIVALENT FOR COUMADIN. 120 tablet 1   No facility-administered medications prior to visit.    ROS: Review of Systems  Constitutional: Negative for appetite change, fatigue and unexpected weight change.  HENT: Negative for congestion, nosebleeds, sneezing, sore throat and trouble  swallowing.   Eyes: Negative for itching and visual disturbance.  Respiratory: Negative for cough.   Cardiovascular: Negative for chest pain, palpitations and leg swelling.  Gastrointestinal: Negative for abdominal distention, blood in stool, diarrhea and nausea.  Genitourinary: Negative for frequency and hematuria.  Musculoskeletal: Negative for back pain, gait problem, joint swelling and neck pain.  Skin: Negative for rash.  Neurological: Positive for syncope. Negative for dizziness, tremors, speech difficulty and weakness.  Psychiatric/Behavioral: Negative for agitation, dysphoric mood, sleep disturbance and suicidal ideas. The patient is not nervous/anxious.     Objective:  BP (!) 148/72 (BP Location: Left Arm, Patient Position: Sitting, Cuff Size: Normal)   Pulse (!) 51   Temp 98 F (36.7 C) (Oral)   Ht 5\' 10"  (1.778 m)   Wt 175 lb (79.4 kg)   SpO2 98%   BMI 25.11 kg/m   BP Readings from Last 3 Encounters:  03/05/19 (!) 148/72  11/26/18 (!) 160/80  11/17/18 (!) 150/60    Wt Readings from Last 3 Encounters:  03/05/19 175 lb (79.4 kg)  11/26/18 172 lb (78 kg)  11/17/18 175 lb 1.9 oz (79.4 kg)    Physical Exam Constitutional:      General: He is not in acute distress.    Appearance: He is well-developed.     Comments: NAD  Eyes:     Conjunctiva/sclera: Conjunctivae normal.     Pupils: Pupils are equal, round, and reactive to light.  Neck:     Thyroid: No thyromegaly.     Vascular: No JVD.  Cardiovascular:     Rate and Rhythm: Normal rate. Rhythm irregular.     Heart sounds: Normal heart sounds. No murmur. No friction rub. No gallop.   Pulmonary:     Effort: Pulmonary effort is normal. No respiratory distress.     Breath sounds: Normal breath sounds. No wheezing or rales.  Chest:     Chest wall: No tenderness.  Abdominal:     General: Bowel sounds are normal. There is no distension.     Palpations: Abdomen is soft. There is no mass.     Tenderness: There is  no abdominal tenderness. There is no guarding or rebound.  Musculoskeletal:        General: No tenderness. Normal range of motion.     Cervical back: Normal range of motion.  Lymphadenopathy:     Cervical: No cervical adenopathy.  Skin:    General: Skin is warm and dry.     Findings: No rash.  Neurological:     Mental Status: He is alert and oriented to person, place, and time.     Cranial Nerves: No cranial nerve deficit.     Motor: No abnormal muscle tone.     Coordination: Coordination normal.     Gait: Gait normal.     Deep Tendon Reflexes: Reflexes are normal and symmetric.  Psychiatric:        Behavior: Behavior normal.        Thought Content: Thought content normal.        Judgment: Judgment normal.     See BP drop w/standing - no sx's however  Procedure: EKG Indication: syncope Impression: A fib w/PVCs.   Lab Results  Component Value Date   WBC 6.8 12/18/2017   HGB 12.9 (L) 12/18/2017   HCT 37.4 (L) 12/18/2017   PLT 336.0 12/18/2017   GLUCOSE 111 (H) 11/26/2018   CHOL 140 04/22/2017   TRIG 113.0 04/22/2017   HDL 48.50 04/22/2017   LDLCALC 69 04/22/2017   ALT 13 11/26/2018   AST 23 11/26/2018   NA 138 11/26/2018   K 4.0 11/26/2018   CL 104 11/26/2018   CREATININE 1.20 11/26/2018   BUN 24 (H) 11/26/2018   CO2 26 11/26/2018   TSH 8.45 (H) 01/19/2019   PSA 0.87 10/20/2014   INR 2.7 02/13/2019   HGBA1C 5.9 11/26/2018    No results found.  Assessment & Plan:   Walker Kehr, MD

## 2019-03-06 ENCOUNTER — Telehealth (INDEPENDENT_AMBULATORY_CARE_PROVIDER_SITE_OTHER): Payer: Medicare Other | Admitting: Physician Assistant

## 2019-03-06 ENCOUNTER — Encounter: Payer: Self-pay | Admitting: Physician Assistant

## 2019-03-06 ENCOUNTER — Telehealth: Payer: Self-pay | Admitting: Radiology

## 2019-03-06 ENCOUNTER — Encounter: Payer: Self-pay | Admitting: Pharmacist

## 2019-03-06 ENCOUNTER — Ambulatory Visit (INDEPENDENT_AMBULATORY_CARE_PROVIDER_SITE_OTHER): Payer: Self-pay | Admitting: Pharmacist

## 2019-03-06 ENCOUNTER — Telehealth: Payer: Self-pay | Admitting: *Deleted

## 2019-03-06 VITALS — BP 141/60 | HR 76 | Ht 70.0 in | Wt 170.0 lb

## 2019-03-06 DIAGNOSIS — G459 Transient cerebral ischemic attack, unspecified: Secondary | ICD-10-CM

## 2019-03-06 DIAGNOSIS — Z952 Presence of prosthetic heart valve: Secondary | ICD-10-CM

## 2019-03-06 DIAGNOSIS — R55 Syncope and collapse: Secondary | ICD-10-CM

## 2019-03-06 DIAGNOSIS — Z9889 Other specified postprocedural states: Secondary | ICD-10-CM

## 2019-03-06 DIAGNOSIS — I10 Essential (primary) hypertension: Secondary | ICD-10-CM

## 2019-03-06 DIAGNOSIS — Z7901 Long term (current) use of anticoagulants: Secondary | ICD-10-CM

## 2019-03-06 DIAGNOSIS — I4891 Unspecified atrial fibrillation: Secondary | ICD-10-CM

## 2019-03-06 DIAGNOSIS — I059 Rheumatic mitral valve disease, unspecified: Secondary | ICD-10-CM

## 2019-03-06 DIAGNOSIS — I4821 Permanent atrial fibrillation: Secondary | ICD-10-CM

## 2019-03-06 DIAGNOSIS — I272 Pulmonary hypertension, unspecified: Secondary | ICD-10-CM

## 2019-03-06 DIAGNOSIS — I6350 Cerebral infarction due to unspecified occlusion or stenosis of unspecified cerebral artery: Secondary | ICD-10-CM

## 2019-03-06 MED ORDER — MAGNESIUM OXIDE 400 MG PO TABS: 400.0000 mg | ORAL_TABLET | Freq: Every day | ORAL | 3 refills | Status: DC

## 2019-03-06 NOTE — Progress Notes (Signed)
Virtual Visit via Telephone Note   This visit type was conducted due to national recommendations for restrictions regarding the COVID-19 Pandemic (e.g. social distancing) in an effort to limit this patient's exposure and mitigate transmission in our community.  Due to his co-morbid illnesses, this patient is at least at moderate risk for complications without adequate follow up.  This format is felt to be most appropriate for this patient at this time.  The patient did not have access to video technology/had technical difficulties with video requiring transitioning to audio format only (telephone).  All issues noted in this document were discussed and addressed.  No physical exam could be performed with this format.  Please refer to the patient's chart for his  consent to telehealth for [facility]. Virtual platform was offered given ongoing worsening Covid-19 pandemic.  Date:  03/06/2019   ID:  [name], DOB 1934-03-06, MRN [id number]  Patient Location: Home Provider Location: Home  PCP:  Plotnikov, Evie Lacks, MD  Cardiologist:  Lauree Chandler, MD  Electrophysiologist:  None   Evaluation Performed:  Follow-Up Visit  Chief Complaint:  Passed out on treadmill  History of Present Illness:    Anthony Gould is a 85 y.o. male with severe mitral regurgitation s/p Medronic Hall MVR in 1986, permanent atrial fibrillation, pulmonary HTN with elevated PASP by echo 05/2014, HTN, HLD (followed by PCP), venous insufficiency, PVCs, CKD stage III by labs, cirrhotic changes of the liver by imaging (followed by PCP), former ETOH use, DM and prior TIA on warfarin who presents for virtual evaluation of syncope. He has no prior history of coronary disease in his chart. He recalls being told he had clean coronaries before his MVR. He had a nuclear stress test in 2014 which was normal. Last echocardiogram 05/2014 showed mild LVH, EF 50-55%, mechanical tilting-disc valve is well-seated without  evidence for obstruction, severely dilated LA, moderately dilated RV, severely dilated RA, moderate TR with mild PR and moderately elevated PASP of 32mmHg.  He presents for evaluation of syncope while on the treadmill on 12/9. Interestingly he reports that ever since his original MVR, he has had intermittent episodes of very transient disorientiation that only last about 10 seconds. The episodes used to be more frequent (5-10x/year) but then he got put on ASA and they decreased in frequency. He has also been maintained in dypridamole and warfarin by his cardiologists (Dr. Saunders Revel then Dr. Angelena Form). This now occurs maybe twice a year, unprovoked, without any other acute symptoms. He was walking on the treadmill in his [facility] on 12/9 when he felt that funny disorientation feeling. He reached out to the bars in front of him and the next thing he knew, he was on the floor and his arm was bleeding. There was not yet any blood dripping to the ground so he thinks he was only out a brief time. He was disoriented and initially thought he was on his bed. No b/b incontinence. He was able to get up and go walk to his wife in the other room. He called PCP and was advised to go to [facility] whom he says helped bandage his arm up. He was asked to go on to the ER but I do not see that he went. He saw his PCP yesterday. His pulse was 51bpm (72 by EKG with one PVC), pulse ox 98%. Initial BP recorded was 148/72 sitting, then 160/80 supine, then 144/74 standing. Orthostatic pulse not recorded. TSH was mildly elevated at 5.18, INR  3.5, Mg 1.7, Hgb 13.0, K 4.0, Cr 1.34 (c/w prior), LFTs wnl, normal CK-MB. His Lasix was reduced to 20mg  daily and he was instructed not to drive. He has not had any other unusual symptoms of chest pain, SOB, edema, or palpitations. He denies any history of syncope otherwise.  The patient does not have symptoms concerning for COVID-19 infection (fever, chills, cough, or new shortness of breath).    Past Medical  History:  Diagnosis Date  . Blood transfusion without reported diagnosis   . CKD (chronic kidney disease), stage III   . Elevated TSH   . History of mitral valve replacement   . HTN (hypertension)   . Hyperlipidemia   . Mitral regurgitation    s/p MVR with Med[employer] Hall MVR 1986  . Normal nuclear stress test 12/25/01   no ischemia  . Peripheral edema    venous insufficiency  . Permanent atrial fibrillation ([place])   . Pulmonary hypertension ([place])   . TIA (transient ischemic attack)    while on coumadin  . Type II or unspecified type diabetes mellitus without mention of complication, not stated as uncontrolled    lifestyle mangement  . Ventricular ectopy    symptomatic   Past Surgical History:  Procedure Laterality Date  . CHOLECYSTECTOMY, LAPAROSCOPIC  2003  . MITRAL VALVE REPLACEMENT     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     Current Meds  Medication Sig  . allopurinol (ZYLOPRIM) 300 MG tablet Take 1 tablet (300 mg total) by mouth daily as needed.  [name] aspirin 81 MG EC tablet Take 81 mg by mouth daily.    [name] atorvastatin (LIPITOR) 20 MG tablet TAKE 1 TABLET BY MOUTH TWO TIMES A WEEK  . bisoprolol (ZEBETA) 5 MG tablet Take 0.5 tablets (2.5 mg total) by mouth daily.  . celecoxib (CELEBREX) 200 MG capsule Take 200 mg by mouth 2 (two) times daily. As needed  . diltiazem (CARDIZEM CD) 240 MG 24 hr capsule TAKE ONE CAPSULE BY MOUTH DAILY  . dipyridamole (PERSANTINE) 50 MG tablet TAKE 1 TABLET BY MOUTH THREE TIMES DAILY  . enalapril (VASOTEC) 20 MG tablet Take 1 tablet (20 mg total) by mouth daily.  . furosemide (LASIX) 40 MG tablet Take 0.5 tablets (20 mg total) by mouth daily.  [name] latanoprost (XALATAN) 0.005 % ophthalmic solution Place 1 drop into both eyes at bedtime.  . Milk Thistle 1000 MG CAPS Take 1 capsule by mouth daily.  . pantoprazole (PROTONIX) 40 MG tablet TAKE 1 TABLET BY MOUTH DAILY  . warfarin (COUMADIN) 5 MG tablet TAKE BY MOUTH AS DIRECTED BY THE  ANTICOAGULATION CLINIC. GENERIC EQUIVALENT FOR COUMADIN.     Allergies:   Sulfonamide derivatives   Social History   Tobacco Use  . Smoking status: Never Smoker  . Smokeless tobacco: Never Used  Substance Use Topics  . Alcohol use: No    Alcohol/week: 0.0 standard drinks  . Drug use: No     Family Hx: The patient's family history includes Coronary artery disease in his father and another family member; Diabetes in an other family member. There is no history of Prostate cancer or Colon cancer.  ROS:   Please see the history of present illness.    All other systems reviewed and are negative.   Prior CV studies:    Most recent pertinent cardiac studies are outlined above.  Labs/Other Tests and Data Reviewed:    EKG:  An ECG dated 03/05/19 was personally reviewed today  and demonstrated:  atrial fib 72bpm with occasional PVC, nonspecific QRS widening, similar to prior  Recent Labs: 03/05/2019: ALT 18; BUN 19; Creatinine, Ser 1.34; Hemoglobin 13.0; Magnesium 1.7; Platelets 211.0; Potassium 4.0; Sodium 139; TSH 5.18   Recent Lipid Panel Lab Results  Component Value Date/Time   CHOL 140 04/22/2017 09:05 AM   TRIG 113.0 04/22/2017 09:05 AM   HDL 48.50 04/22/2017 09:05 AM   CHOLHDL 3 04/22/2017 09:05 AM   LDLCALC 69 04/22/2017 09:05 AM    Wt Readings from Last 3 Encounters:  03/06/19 170 lb (77.1 kg)  03/05/19 175 lb (79.4 kg)  11/26/18 172 lb (78 kg)     Objective:    Vital Signs:  BP (!) 141/60   Pulse 76   Ht 5\' 10"  (1.778 m)   Wt 170 lb (77.1 kg)   BMI 24.39 kg/m    VS reviewed. General - calm M in no acute distress Pulm - No labored breathing, no coughing during visit, no audible wheezing, speaking in full sentences Neuro - A+Ox3, no slurred speech, answers questions appropriately Psych - Pleasant affect     ASSESSMENT & PLAN:    1. Syncope - concerning for arrhythmia, cannot exclude neurologic event. Interestingly he has a longstanding history of  transient "disorientation" going back to MVR that improved with addition of ASA. Discussed with Dr. Angelena Form. Will plan for 2D echocardiogram (ASAP, to be scheduled Monday) and 30-day live event monitor. If his event monitor does not show any abnormalities, I would recommend referral to EP for ILR. I introduced this plan B to the patient. Initially he was hesitant to consider the possibility because he thought it was a surgical procedure but based on simplicity of insertion he says he may be willing to consider if his event monitor is unremarkable. Can consider carotid duplex if initial w/u unrevealing, although carotid stenosis does not traditionally cause LOC. Would otherwise consider referral to neurology if no cardiac cause identified. He denies any other acute focal neurologic symptoms. I reinforced recommendation of no driving until cleared by his cardiology team, traditionally 6 months after syncopal event. I will also add MagOx 400mg  daily given Mg of 1.7. He states he was on Mag for several years but went off after he wasn't quite sure why he was taking it anymore.  2. History of mitral valve replacement - reassess by echo above. He is aware of SBE ppx. No medication changes made today. 3. Permanent atrial fibrillation - he is maintained on warfarin, followed in coumadin clinic. I will reach out to let pharmD know that primary care included INR in labs yesterday. EKG yesterday showed AF with CVR and one PVC. 4. Pulmonary HTN - reassess by echocardiogram. POx was normal at yesterday's primary care visit. 5. Essential HTN - follow for now - would not aggressively treat until syncopal workup is complete.  COVID-19 Education: The signs and symptoms of COVID-19 were discussed with the patient and how to seek care for testing (follow up with PCP or arrange E-visit).  The importance of social distancing was discussed today.  Time:   Today, I have spent 20 minutes with the patient with telehealth  technology discussing the above problems.     Medication Adjustments/Labs and Tests Ordered: Current medicines are reviewed at length with the patient today.  Concerns regarding medicines are outlined above.   Follow Up: in office with either me or Dr. Angelena Form in 6-8 weeks (after completion of monitor)  Signed, Charlie Pitter, [name]  03/06/2019 12:17 PM    [facility]

## 2019-03-06 NOTE — Patient Instructions (Signed)
Continue taking 5mg  daily except 7.5mg  on Mondays. Recheck in 6 weeks. Coumadin Clinic [phone]

## 2019-03-06 NOTE — Patient Instructions (Addendum)
Medication Instructions:  1. START MAGNESIUM OX 400 MG ONCE A DAY; RX HAS BEEN SENT; YOU CAN ALSO GET THIS OVER THE COUNTER *If you need a refill on your cardiac medications before your next appointment, please call your pharmacy*  Lab Work: NONE ORDERED TODAY If you have labs (blood work) drawn today and your tests are completely normal, you will receive your results only by: [name] MyChart Message (if you have MyChart) OR . A paper copy in the mail If you have any lab test that is abnormal or we need to change your treatment, we will call you to review the results.  Testing/Procedures: Your physician has requested that you have an echocardiogram. Echocardiography is a painless test that uses sound waves to create images of your heart. It provides your doctor with information about the size and shape of your heart and how well your heart's chambers and valves are working. This procedure takes approximately one hour. There are no restrictions for this procedure. TO BE DONE 03/09/19 @ 9:35 AM; PLEASE ARRIVE [place] REGISTRATION  Your physician has recommended that you wear an event monitor. Event monitors are medical devices that record the heart's electrical activity. Doctors most often [place] these monitors to diagnose arrhythmias. Arrhythmias are problems with the speed or rhythm of the heartbeat. The monitor is a small, portable device. You can wear one while you do your normal daily activities. This is usually used to diagnose what is causing palpitations/syncope (passing out).   Follow-Up: At [facility], you and your health needs are our priority.  As part of our continuing mission to provide you with exceptional heart care, we have created designated Provider Care Teams.  These Care Teams include your primary Cardiologist (physician) and Advanced Practice Providers (APPs -  Physician Assistants and Nurse Practitioners) who all work together to provide you with the care you need, when  you need it.  Your next appointment:   8 week(s)  The format for your next appointment:   In Person  Provider:   Lauree Chandler, MD  Other Instructions [place], [place] 04/29/19 @ 3:30 PM IN PERSON VISIT  ALSO NO DRIVING UNTIL CLEARED BY CARDIOLOGY

## 2019-03-06 NOTE — Telephone Encounter (Signed)
Enrolled patient for a 30 day [employer] Event monitor to be mailed to patients home.  

## 2019-03-06 NOTE — Telephone Encounter (Signed)
This encounter was created in error - please disregard.

## 2019-03-06 NOTE — Telephone Encounter (Signed)
Received urgent referral for patient from scheduling for Dr. Angelena Form.  Pt had syncopal episode on 03/04/19.  Saw PCP yesterday and referred to cardiology. I reviewed with Anthony Copa, [name] who can do a virtual visit with pt today. Spoke with patient who is in agreement to visit today.  He is hopeful to get an in person appointment as well.  I adv this will be a good place to start so that any needed testing/further evaluation could be decided.  He has an echo planned for March.  YOUR CARDIOLOGY TEAM HAS ARRANGED FOR AN E-VISIT FOR YOUR APPOINTMENT - PLEASE REVIEW IMPORTANT INFORMATION BELOW SEVERAL DAYS PRIOR TO YOUR APPOINTMENT  Due to the recent COVID-19 pandemic, we are transitioning in-person office visits to tele-medicine visits in an effort to decrease unnecessary exposure to our patients, their families, and staff. These visits are billed to your insurance just like a normal visit is. We also encourage you to sign up for MyChart if you have not already done so. You will need a smartphone if possible. For patients that do not have this, we can still complete the visit using a regular telephone but do prefer a smartphone to enable video when possible. You may have a family member that lives with you that can help. If possible, we also ask that you have a blood pressure cuff and scale at home to measure your blood pressure, heart rate and weight prior to your scheduled appointment. Patients with clinical needs that need an in-person evaluation and testing will still be able to come to the office if absolutely necessary. If you have any questions, feel free to call our office.      Virtual Visit Pre-Appointment Phone Call  "(Name), I am calling you today to discuss your upcoming appointment. We are currently trying to limit exposure to the virus that causes COVID-19 by seeing patients at home rather than in the office."  1. "What is the BEST phone number to call the day of the visit?" - include  this in appointment notes  2. "Do you have or have access to (through a family member/friend) a smartphone with video capability that we can use for your visit?" a. If yes - list this number in appt notes as "cell" (if different from BEST phone #) and list the appointment type as a VIDEO visit in appointment notes b. If no - list the appointment type as a PHONE visit in appointment notes  3. Confirm consent - "In the setting of the current Covid19 crisis, you are scheduled for a (phone or video) visit with your provider on (date) at (time).  Just as we do with many in-office visits, in order for you to participate in this visit, we must obtain consent.  If you'd like, I can send this to your mychart (if signed up) or email for you to review.  Otherwise, I can obtain your verbal consent now.  All virtual visits are billed to your insurance company just like a normal visit would be.  By agreeing to a virtual visit, we'd like you to understand that the technology does not allow for your provider to perform an examination, and thus may limit your provider's ability to fully assess your condition. If your provider identifies any concerns that need to be evaluated in person, we will make arrangements to do so.  Finally, though the technology is pretty good, we cannot assure that it will always work on either your or our end, and in the  setting of a video visit, we may have to convert it to a phone-only visit.  In either situation, we cannot ensure that we have a secure connection.  Are you willing to proceed?" STAFF: Did the patient verbally acknowledge consent to telehealth visit? Document YES/NO here: YES  4. Advise patient to be prepared - "Two hours prior to your appointment, go ahead and check your blood pressure, pulse, oxygen saturation, and your weight (if you have the equipment to check those) and write them all down. When your visit starts, your provider will ask you for this information. If you have an  Apple Watch or Kardia device, please plan to have heart rate information ready on the day of your appointment. Please have a pen and paper handy nearby the day of the visit as well."  5. Give patient instructions for MyChart download to smartphone OR [email or web address] as below if video visit (depending on what platform provider is using)  6. Inform patient they will receive a phone call 15 minutes prior to their appointment time (may be from unknown caller ID) so they should be prepared to answer    TELEPHONE CALL NOTE  Anthony Gould has been deemed a candidate for a follow-up tele-health visit to limit community exposure during the Covid-19 pandemic. I spoke with the patient via phone to ensure availability of phone/video source, confirm preferred email & phone number, and discuss instructions and expectations.  I reminded Anthony Gould to be prepared with any vital sign and/or heart rhythm information that could potentially be obtained via home monitoring, at the time of his visit. I reminded Anthony Gould to expect a phone call prior to his visit.  [name], RN 03/06/2019 8:48 AM   INSTRUCTIONS FOR DOWNLOADING THE MYCHART APP TO SMARTPHONE  - The patient must first make sure to have activated MyChart and know their login information - If Apple, go to [employer] and type in MyChart in the search bar and download the app. If Android, ask patient to go to [employer] and type in [place] in the search bar and download the app. The app is free but as with any other app downloads, their phone may require them to verify saved payment information or Apple/Android password.  - The patient will need to then log into the app with their MyChart username and password, and select [facility] as their healthcare provider to link the account. When it is time for your visit, go to the MyChart app, find appointments, and click Begin Video Visit. Be sure to Select Allow for your device  to access the Microphone and Camera for your visit. You will then be connected, and your provider will be with you shortly.  **If they have any issues connecting, or need assistance please contact MyChart service desk [phone])**  **If using a computer, in order to ensure the best quality for their visit they will need to use either of the following Internet Browsers: [employer], or Google Chrome**  IF USING DOXIMITY or DOXY.ME - The patient will receive a link just prior to their visit by text.     FULL LENGTH CONSENT FOR TELE-HEALTH VISIT   I hereby voluntarily request, consent and authorize [place] and its employed or contracted physicians, physician assistants, nurse practitioners or other licensed health care professionals (the Practitioner), to provide me with telemedicine health care services (the "Services") as deemed necessary by the treating Practitioner. I acknowledge and consent to receive the  Services by the Practitioner via telemedicine. I understand that the telemedicine visit will involve communicating with the Practitioner through live audiovisual communication technology and the disclosure of certain medical information by electronic transmission. I acknowledge that I have been given the opportunity to request an in-person assessment or other available alternative prior to the telemedicine visit and am voluntarily participating in the telemedicine visit.  I understand that I have the right to withhold or withdraw my consent to the use of telemedicine in the course of my care at any time, without affecting my right to future care or treatment, and that the Practitioner or I may terminate the telemedicine visit at any time. I understand that I have the right to inspect all information obtained and/or recorded in the course of the telemedicine visit and may receive copies of available information for a reasonable fee.  I understand that some of the potential  risks of receiving the Services via telemedicine include:  [name] Delay or interruption in medical evaluation due to technological equipment failure or disruption; . Information transmitted may not be sufficient (e.g. poor resolution of images) to allow for appropriate medical decision making by the Practitioner; and/or  . In rare instances, security protocols could fail, causing a breach of personal health information.  Furthermore, I acknowledge that it is my responsibility to provide information about my medical history, conditions and care that is complete and accurate to the best of my ability. I acknowledge that Practitioner's advice, recommendations, and/or decision may be based on factors not within their control, such as incomplete or inaccurate data provided by me or distortions of diagnostic images or specimens that may result from electronic transmissions. I understand that the practice of medicine is not an exact science and that Practitioner makes no warranties or guarantees regarding treatment outcomes. I acknowledge that I will receive a copy of this consent concurrently upon execution via email to the email address I last provided but may also request a printed copy by calling the office of [facility].    I understand that my insurance will be billed for this visit.   I have read or had this consent read to me. . I understand the contents of this consent, which adequately explains the benefits and risks of the Services being provided via telemedicine.  . I have been provided ample opportunity to ask questions regarding this consent and the Services and have had my questions answered to my satisfaction. . I give my informed consent for the services to be provided through the use of telemedicine in my medical care  By participating in this telemedicine visit I agree to the above.

## 2019-03-09 ENCOUNTER — Other Ambulatory Visit: Payer: Self-pay

## 2019-03-09 ENCOUNTER — Ambulatory Visit (HOSPITAL_COMMUNITY): Payer: Medicare Other | Attending: Cardiovascular Disease

## 2019-03-09 DIAGNOSIS — R55 Syncope and collapse: Secondary | ICD-10-CM | POA: Insufficient documentation

## 2019-03-12 ENCOUNTER — Telehealth: Payer: Self-pay

## 2019-03-12 NOTE — Telephone Encounter (Signed)
Patient calling and needing help with his monitor he received. Will send message to monitor tech.

## 2019-03-12 NOTE — Telephone Encounter (Signed)
Returned patients call and answered all his questions about his monitor that was ordered for him

## 2019-03-13 ENCOUNTER — Telehealth: Payer: Self-pay | Admitting: Cardiovascular Disease

## 2019-03-13 ENCOUNTER — Encounter: Payer: Self-pay | Admitting: Cardiovascular Disease

## 2019-03-13 ENCOUNTER — Encounter (INDEPENDENT_AMBULATORY_CARE_PROVIDER_SITE_OTHER): Payer: Medicare Other

## 2019-03-13 DIAGNOSIS — R55 Syncope and collapse: Secondary | ICD-10-CM | POA: Diagnosis not present

## 2019-03-13 NOTE — Telephone Encounter (Signed)
Arbie Cookey from [employer] calling with critical EKG results. States the results are uploaded to the web and to call if there are any questions.

## 2019-03-13 NOTE — Telephone Encounter (Signed)
New message    Arbie Cookey, [employer] calling with monitor results Phone [phone]

## 2019-03-16 ENCOUNTER — Telehealth: Payer: Self-pay

## 2019-03-16 NOTE — Telephone Encounter (Signed)
Report received from an auto trigger from event monitor. Showed atrial fibrillation with PVCs at 11:09AM on 03/13/19. Patient has known A fib. Patient reports no symptoms other than palpitations that he typically feels. Reviewed by DOD, Dr. Johnsie Cancel.

## 2019-03-30 ENCOUNTER — Ambulatory Visit: Payer: Medicare Other | Admitting: Internal Medicine

## 2019-04-02 ENCOUNTER — Other Ambulatory Visit: Payer: Self-pay

## 2019-04-02 ENCOUNTER — Ambulatory Visit (INDEPENDENT_AMBULATORY_CARE_PROVIDER_SITE_OTHER): Payer: Medicare Other | Admitting: Internal Medicine

## 2019-04-02 ENCOUNTER — Encounter: Payer: Self-pay | Admitting: Internal Medicine

## 2019-04-02 DIAGNOSIS — R55 Syncope and collapse: Secondary | ICD-10-CM | POA: Diagnosis not present

## 2019-04-02 DIAGNOSIS — E1159 Type 2 diabetes mellitus with other circulatory complications: Secondary | ICD-10-CM | POA: Diagnosis not present

## 2019-04-02 DIAGNOSIS — G459 Transient cerebral ischemic attack, unspecified: Secondary | ICD-10-CM | POA: Diagnosis not present

## 2019-04-02 NOTE — Assessment & Plan Note (Addendum)
ECHO - stable Long term intermittent episodes of very transient disorientiation that only last about 10 seconds. The episodes used to be more frequent (5-10x/year) but then he got put on ASA and they decreased in frequency.  Pt declined Neurology ref and EEG F/u w/Cardiology pending

## 2019-04-02 NOTE — Assessment & Plan Note (Signed)
Mg is borderline low - on Mag now

## 2019-04-02 NOTE — Progress Notes (Signed)
Subjective:  Patient ID: Anthony Gould, male    DOB: Aug 02, 1933  Age: 86 y.o. MRN: [id number]  CC: No chief complaint on file.   HPI BATTAL KETTERING presents for syncope C/o being off balance and lightheaded F/u neurologic spells - "intermittent episodes of very transient disorientiation that only last about 10 seconds. The episodes used to be more frequent (5-10x/year) but then he got put on ASA and they decreased in frequency" - no relapse  Outpatient Medications Prior to Visit  Medication Sig Dispense Refill  . allopurinol (ZYLOPRIM) 300 MG tablet Take 1 tablet (300 mg total) by mouth daily as needed. 90 tablet 3  . aspirin 81 MG EC tablet Take 81 mg by mouth daily.      [name] atorvastatin (LIPITOR) 20 MG tablet TAKE 1 TABLET BY MOUTH TWO TIMES A WEEK 26 tablet 1  . bisoprolol (ZEBETA) 5 MG tablet Take 0.5 tablets (2.5 mg total) by mouth daily. 45 tablet 3  . celecoxib (CELEBREX) 200 MG capsule Take 200 mg by mouth 2 (two) times daily. As needed    . diltiazem (CARDIZEM CD) 240 MG 24 hr capsule TAKE ONE CAPSULE BY MOUTH DAILY 90 capsule 1  . dipyridamole (PERSANTINE) 50 MG tablet TAKE 1 TABLET BY MOUTH THREE TIMES DAILY 270 tablet 2  . enalapril (VASOTEC) 20 MG tablet Take 1 tablet (20 mg total) by mouth daily. 90 tablet 3  . furosemide (LASIX) 40 MG tablet Take 0.5 tablets (20 mg total) by mouth daily. 90 tablet 3  . latanoprost (XALATAN) 0.005 % ophthalmic solution Place 1 drop into both eyes at bedtime.    . magnesium oxide (MAG-OX) 400 MG tablet Take 1 tablet (400 mg total) by mouth daily. 90 tablet 3  . Milk Thistle 1000 MG CAPS Take 1 capsule by mouth daily.    . pantoprazole (PROTONIX) 40 MG tablet TAKE 1 TABLET BY MOUTH DAILY 90 tablet 3  . warfarin (COUMADIN) 5 MG tablet TAKE BY MOUTH AS DIRECTED BY THE ANTICOAGULATION CLINIC. GENERIC EQUIVALENT FOR COUMADIN. 120 tablet 1   No facility-administered medications prior to visit.    ROS: Review of Systems  Constitutional:  Positive for fatigue. Negative for appetite change and unexpected weight change.  HENT: Negative for congestion, nosebleeds, sneezing, sore throat and trouble swallowing.   Eyes: Negative for itching and visual disturbance.  Respiratory: Negative for cough.   Cardiovascular: Negative for chest pain, palpitations and leg swelling.  Gastrointestinal: Negative for abdominal distention, blood in stool, diarrhea and nausea.  Genitourinary: Negative for frequency and hematuria.  Musculoskeletal: Negative for back pain, gait problem, joint swelling and neck pain.  Skin: Negative for rash.  Neurological: Positive for weakness and light-headedness. Negative for dizziness, tremors, seizures and speech difficulty.  Psychiatric/Behavioral: Negative for agitation, confusion, dysphoric mood, sleep disturbance and suicidal ideas. The patient is not nervous/anxious.     Objective:  BP (!) 154/72 (BP Location: Left Arm, Patient Position: Sitting, Cuff Size: Normal)   Pulse 77   Temp 98.2 F (36.8 C) (Oral)   Ht 5\' 10"  (1.778 m)   Wt 176 lb (79.8 kg)   SpO2 97%   BMI 25.25 kg/m   BP Readings from Last 3 Encounters:  04/02/19 (!) 154/72  03/06/19 (!) 141/60  03/05/19 (!) 144/74    Wt Readings from Last 3 Encounters:  04/02/19 176 lb (79.8 kg)  03/06/19 170 lb (77.1 kg)  03/05/19 175 lb (79.4 kg)    Physical Exam Constitutional:  General: He is not in acute distress.    Appearance: He is well-developed.     Comments: NAD  Eyes:     Conjunctiva/sclera: Conjunctivae normal.     Pupils: Pupils are equal, round, and reactive to light.  Neck:     Thyroid: No thyromegaly.     Vascular: No JVD.  Cardiovascular:     Rate and Rhythm: Normal rate. Rhythm irregular.     Heart sounds: Normal heart sounds. No murmur. No friction rub. No gallop.   Pulmonary:     Effort: Pulmonary effort is normal. No respiratory distress.     Breath sounds: Normal breath sounds. No wheezing or rales.  Chest:       Chest wall: No tenderness.  Abdominal:     General: Bowel sounds are normal. There is no distension.     Palpations: Abdomen is soft. There is no mass.     Tenderness: There is no abdominal tenderness. There is no guarding or rebound.  Musculoskeletal:        General: No tenderness. Normal range of motion.     Cervical back: Normal range of motion.  Lymphadenopathy:     Cervical: No cervical adenopathy.  Skin:    General: Skin is warm and dry.     Findings: No rash.  Neurological:     Mental Status: He is alert and oriented to person, place, and time.     Cranial Nerves: No cranial nerve deficit.     Motor: No abnormal muscle tone.     Coordination: Coordination normal.     Gait: Gait normal.     Deep Tendon Reflexes: Reflexes are normal and symmetric.  Psychiatric:        Behavior: Behavior normal.        Thought Content: Thought content normal.        Judgment: Judgment normal.   trace edema B  Lab Results  Component Value Date   WBC 7.4 03/05/2019   HGB 13.0 03/05/2019   HCT 38.7 (L) 03/05/2019   PLT 211.0 03/05/2019   GLUCOSE 150 (H) 03/05/2019   CHOL 140 04/22/2017   TRIG 113.0 04/22/2017   HDL 48.50 04/22/2017   LDLCALC 69 04/22/2017   ALT 18 03/05/2019   AST 27 03/05/2019   NA 139 03/05/2019   K 4.0 03/05/2019   CL 102 03/05/2019   CREATININE 1.34 03/05/2019   BUN 19 03/05/2019   CO2 27 03/05/2019   TSH 5.18 (H) 03/05/2019   PSA 0.87 10/20/2014   INR 3.5 (H) 03/05/2019   HGBA1C 5.9 11/26/2018    No results found.  Assessment & Plan:     Follow-up: No follow-ups on file.  Walker Kehr, MD

## 2019-04-02 NOTE — Assessment & Plan Note (Signed)
Long term intermittent episodes of very transient disorientiation that only last about 10 seconds. The episodes used to be more frequent (5-10x/year) but then he got put on ASA and they decreased in frequency.   Pt declined Neurology ref and EEG

## 2019-04-02 NOTE — Assessment & Plan Note (Signed)
  On diet  

## 2019-04-06 ENCOUNTER — Other Ambulatory Visit: Payer: Self-pay | Admitting: Internal Medicine

## 2019-04-13 ENCOUNTER — Other Ambulatory Visit: Payer: Self-pay | Admitting: Internal Medicine

## 2019-04-17 ENCOUNTER — Ambulatory Visit (INDEPENDENT_AMBULATORY_CARE_PROVIDER_SITE_OTHER): Payer: Medicare Other | Admitting: *Deleted

## 2019-04-17 ENCOUNTER — Other Ambulatory Visit: Payer: Self-pay

## 2019-04-17 DIAGNOSIS — I6350 Cerebral infarction due to unspecified occlusion or stenosis of unspecified cerebral artery: Secondary | ICD-10-CM | POA: Diagnosis not present

## 2019-04-17 DIAGNOSIS — I059 Rheumatic mitral valve disease, unspecified: Secondary | ICD-10-CM | POA: Diagnosis not present

## 2019-04-17 DIAGNOSIS — Z7901 Long term (current) use of anticoagulants: Secondary | ICD-10-CM

## 2019-04-17 DIAGNOSIS — Z5181 Encounter for therapeutic drug level monitoring: Secondary | ICD-10-CM | POA: Diagnosis not present

## 2019-04-17 DIAGNOSIS — Z9889 Other specified postprocedural states: Secondary | ICD-10-CM | POA: Diagnosis not present

## 2019-04-17 LAB — POCT INR: INR: 3.4 — AB (ref 2.0–3.0)

## 2019-04-17 NOTE — Patient Instructions (Signed)
Description   Continue taking 5mg  daily except 7.5mg  on Mondays. Recheck in 6 weeks. [facility] [phone]

## 2019-04-27 NOTE — Progress Notes (Signed)
Cardiology Office Note    Date:  04/29/2019   ID:  Anthony Gould, DOB 09-24-1933, MRN [id number]  PCP:  Cassandria Anger, MD  Cardiologist:  Lauree Chandler, MD  Electrophysiologist:  None   Chief Complaint: f/u episode of passing out  History of Present Illness:   Anthony Gould is a 86 y.o. male with history of severe mitral regurgitation s/p Med[employer] Hall MVR in 1986, permanent atrial fibrillation, pulmonary HTN with elevated PASP, HTN, HLD (followed by PCP), venous insufficiency, PVCs, CKD stage III by labs, cirrhotic changes of the liver by imaging (followed by PCP), former ETOH use, DM and prior TIA on warfarin who presents for follow-up of syncope.  He has no prior history of coronary disease in his chart. He recalls being told he had clean coronaries before his MVR. He had a nuclear stress test in 2014 which was normal. Echo in 2016 showed normal LVF and normal functioning MVR. I reecntly saw him virtually for evaluation of syncope while on the treadmill on 12/9. Interestingly he reports that ever since his original MVR, he has had intermittent episodes of very transient disorientiation that only last about 10 seconds. The episodes used to be more frequent (5-10x/year) but then he got put on ASA and they decreased in frequency. He has also been maintained in dypridamole and warfarin by his cardiologists (Dr. Saunders Revel then Dr. Angelena Form). This now occurs maybe twice a year, unprovoked, without any other acute symptoms. He was walking on the treadmill in his [facility] on 03/04/19 when he felt that funny disorientation feeling. He reached out to the bars in front of him and the next thing he knew, he was on the floor and his arm was bleeding. There was not yet any blood dripping to the ground so he thinks he was only out a brief time. He was disoriented and initially thought he was on his bed. No b/b incontinence. He was able to get up and go walk to his wife in the other room. He called  PCP and was advised to go to UC whom he says helped bandage his arm up. He was asked to go on to the ER but did not go. He saw his PCP. His pulse was 51bpm (72 by EKG with one PVC), pulse ox 98%. Initial BP recorded was 148/72 sitting, then 160/80 supine, then 144/74 standing. Orthostatic pulse not recorded. Labs personally reviewed include: TSH mildly elevated at 5.18 (normal fT4), INR 3.5, Mg 1.7, Hgb 13.0, K 4.0, Cr 1.34 (c/w prior), LFTs wnl, normal CK-MB. His Lasix was reduced and he was instructed not to drive. He had not had any other unusual symptoms of chest pain, SOB, edema, or palpitations. We arranged an echocardiogram 03/09/19 showing EF 55-60%, moderate LVH, normal RV, severe LAE/RAE, repaired/placed MV with trivial MR, moderate TR, moderately elevated pulmonary artery pressure -> no significant change from prior. Cardiac event monitor formal read is still pending but preliminary read shows atrial fib with average HR 80bpm, occasional PVCs, brief NSVT (vs aberrancy) 3-7 beats without sustained events, and rare episodic bradycardia in the mid 40s - mostly during sleeping hours but one occasion around 5pm. He has declined neurology referral/EEG.   He returns for follow-up today feeling fine. He had zero symptoms while wearing the monitor and feels completely like himself. He denies any recurrent transient disorientation. No CP, SOB, palpitations, orthopnea, dizziness. He indicates concern of his blood pressure running high, including in the 150s-160s at times. He states  it was previously controlled on amlodipine although in more recent years this was switched to diltiazem. He did notice that when he lowered his Lasix to 20mg  daily he had recurrent edema but has done well after he spoke with PCP and changed to Lasix 40mg  daily except 20mg  every 3rd day.   Past Medical History:  Diagnosis Date   Blood transfusion without reported diagnosis    CKD (chronic kidney disease), stage III    Elevated  TSH    History of mitral valve replacement    HTN (hypertension)    Hyperlipidemia    Mitral regurgitation    s/p MVR with [employer] Hall MVR 1986   Normal nuclear stress test 12/25/01   no ischemia   Peripheral edema    venous insufficiency   Permanent atrial fibrillation (HCC)    Pulmonary hypertension (HCC)    TIA (transient ischemic attack)    while on coumadin   Type II or unspecified type diabetes mellitus without mention of complication, not stated as uncontrolled    lifestyle mangement   Ventricular ectopy    symptomatic    Past Surgical History:  Procedure Laterality Date   CHOLECYSTECTOMY, LAPAROSCOPIC  2003   MITRAL VALVE REPLACEMENT     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    Current Medications: Current Meds  Medication Sig   allopurinol (ZYLOPRIM) 300 MG tablet TAKE 1 TABLET BY MOUTH DAILY AS NEEDED. GENERIC EQUIVALENT FOR ZYLOPRIM   aspirin 81 MG EC tablet Take 81 mg by mouth daily.     atorvastatin (LIPITOR) 20 MG tablet TAKE 1 TABLET BY MOUTH TWO TIMES A WEEK   celecoxib (CELEBREX) 200 MG capsule Take 200 mg by mouth 2 (two) times daily. As needed   dipyridamole (PERSANTINE) 50 MG tablet TAKE 1 TABLET BY MOUTH THREE TIMES DAILY   enalapril (VASOTEC) 20 MG tablet Take 1 tablet (20 mg total) by mouth daily.   furosemide (LASIX) 40 MG tablet Take 0.5 tablets (20 mg total) by mouth daily. (Patient taking differently: TAKE 1 TABLET BY MOUTH DAILY EXCEPT 1/2 TABLET EVERY 3RD DAY)   latanoprost (XALATAN) 0.005 % ophthalmic solution Place 1 drop into both eyes at bedtime.   magnesium oxide (MAG-OX) 400 MG tablet Take 1 tablet (400 mg total) by mouth daily.   Milk Thistle 1000 MG CAPS Take 1 capsule by mouth daily.   pantoprazole (PROTONIX) 40 MG tablet TAKE 1 TABLET BY MOUTH DAILY   warfarin (COUMADIN) 5 MG tablet TAKE BY MOUTH AS DIRECTED BY THE ANTICOAGULATION CLINIC. GENERIC EQUIVALENT FOR COUMADIN.   [DISCONTINUED] bisoprolol  (ZEBETA) 5 MG tablet Take 0.5 tablets (2.5 mg total) by mouth daily.   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  Tobacco Use   Smoking status: Never Smoker   Smokeless tobacco: Never Used  Substance and Sexual Activity   Alcohol use: No    Alcohol/week: 0.0 standard drinks   Drug use: No   Sexual activity: Not on file  Other Topics Concern   Not on file  Social History Narrative   HSG, many  management courses   Married '58   2 sons - '61, '62; 1 grandchild   Nonsmoker         Social Determinants of Radio broadcast assistant Strain:    Difficulty of Paying Living Expenses: Not on file  Food Insecurity:    Worried About Charity fundraiser in the Last Year: Not on file   [employer] of Food in the Last Year: Not on file  Transportation Needs:    Lack of Transportation (Medical): Not on file   Lack of Transportation (Non-Medical): Not on file  Physical Activity:    Days of Exercise per Week: Not on file   Minutes of Exercise per Session: Not on file  Stress:    Feeling of Stress : Not on file  Social Connections:    Frequency of Communication with Friends and Family: Not on file   Frequency of Social Gatherings with Friends and Family: Not on file   Attends Religious Services: Not on file   Active Member of Clubs or Organizations: Not on file   Attends Archivist Meetings: Not on file   Marital Status: Not on file     Family History:  The patient's family history includes Coronary artery disease in his father and another family member; Diabetes in an other family member.  There is no history of Prostate cancer or Colon cancer.  ROS:   Please see the history of present illness. All other systems are reviewed and otherwise negative.    EKGs/Labs/Other Studies Reviewed:    Studies reviewed are outlined and summarized above. Reports included below if pertinent.  2D echo 03/09/19 1. Left ventricular ejection fraction, by visual estimation, is 55 to  60%. The left ventricle has normal function. There is moderately increased  left ventricular hypertrophy.  2. Left ventricular diastolic function could not be evaluated.  3. The left ventricle has no regional wall motion abnormalities.  4. Global right ventricle has normal systolic function.The right  ventricular size is normal. No increase in right ventricular wall  thickness.  5. Left atrial size was severely dilated.  6. Right atrial size was severely dilated.  7. The mitral valve has been repaired/replaced. Trivial mitral valve  regurgitation.  8. The tricuspid valve is normal in structure. Tricuspid valve  regurgitation moderate.  9. The aortic valve is normal in structure. Aortic valve regurgitation is  not visualized. No evidence of aortic valve sclerosis or stenosis.  10. The pulmonic valve was grossly normal. Pulmonic valve regurgitation is  trivial.  11. Moderately elevated pulmonary artery systolic pressure.  12. The atrial septum is grossly normal.    EKG:  EKG is not ordered today.  Recent Labs: 03/05/2019: ALT 18; BUN 19; Creatinine, Ser 1.34; Hemoglobin 13.0; Magnesium 1.7; Platelets 211.0; Potassium 4.0; Sodium 139; TSH 5.18  Recent Lipid Panel    Component Value Date/Time   CHOL 140 04/22/2017 0905   TRIG 113.0 04/22/2017 0905   HDL 48.50 04/22/2017 0905   CHOLHDL 3 04/22/2017 0905   VLDL 22.6 04/22/2017 0905   LDLCALC 69 04/22/2017 0905    PHYSICAL EXAM:    VS:  BP (!) 148/72    Pulse 88    Ht 5\' 10"  (1.778 m)    Wt 172 lb 8 oz (78.2 kg) Comment: 167 at home  undressed this morning   SpO2 93%    BMI 24.75 kg/m   BMI: Body mass index is 24.75 kg/m.  GEN: Well nourished, well developed  WM, in no acute distress HEENT: normocephalic, atraumatic Neck: no JVD, carotid bruits, or masses Cardiac: RRR; no murmurs, rubs, or gallops, no edema  Respiratory:  clear to auscultation bilaterally, normal work of breathing GI: soft, nontender, nondistended, + BS MS: no deformity or atrophy Skin: warm and dry, no rash Neuro:  Alert and Oriented x 3, Strength and sensation are intact, follows commands, nonfocal Psych: euthymic mood, full affect  Wt Readings from Last 3 Encounters:  04/29/19 172 lb 8 oz (78.2 kg)  04/02/19 176 lb (79.8 kg)  03/06/19 170 lb (77.1 kg)     ASSESSMENT & PLAN:   1. Syncope - formal cause remains unknown. I will await formal read from event monitor by Dr. Angelena Form. This showed atrial fib, some bradycardia (mostly during sleeping hours although one event around 5pm), and short-lived NSVT. See below regarding beta blocker adjustment. I discussed referral to EP for consideration of ILR but he declines at this time. Will recheck Mg level. Per DMV recommendation, patient should not drive for 6 months from syncopal event given that we do not know why he passed out. I do not have any specific new evidence which would allow me to lift this restriction.  2. Essential HTN - he would like to try and come off diltiazem and go back on amlodipine. I do worry that with his atrial fib, his HR may be poorly controlled off this completely. Therefore, we will increase his bisoprolol to 1 whole tablet (5mg  daily). Per our discussion, will stop diltiazem and add amlodipine 5mg  daily - although I'd like him to take 1/2 tablet daily for the first 3 days to make sure he tolerates these changes, then increase to 1 whole tablet daily if SBP is >130 on current regimen. 3. Permanent atrial fibrillation - follow HR with medication changes above. He is maintained on  Coumadin, followed in Coumadin clinic. He has also been maintained on ASA/dipyridamole. 4. Pulmonary HTN - stable by recent echocardiogram. No symptoms of SOB. Normal O2 sat. Continue to follow clinically for now. 5. History of mitral valve replacement - stable by recent echo. He had another upcoming echo scheduled for March. We will cancel since he just had one in December. We discussed SBE ppx last visit.  Disposition: F/u with me virtually on 2/15 to revisit HR/BP control.  Medication Adjustments/Labs and Tests Ordered: Current medicines are reviewed at length with the patient today.  Concerns regarding medicines are outlined above. Medication changes, Labs and Tests ordered today are summarized above and listed in the Patient Instructions accessible in Encounters.   Signed, Charlie Pitter, [name]  04/29/2019 4:57 PM    [facility] [place], [place], [place]  [address] Phone: [phone]; Fax: [phone]

## 2019-04-29 ENCOUNTER — Ambulatory Visit: Payer: Medicare Other | Admitting: Physician Assistant

## 2019-04-29 ENCOUNTER — Encounter: Payer: Self-pay | Admitting: Physician Assistant

## 2019-04-29 ENCOUNTER — Other Ambulatory Visit: Payer: Self-pay

## 2019-04-29 VITALS — BP 148/72 | HR 88 | Ht 70.0 in | Wt 172.5 lb

## 2019-04-29 DIAGNOSIS — I4821 Permanent atrial fibrillation: Secondary | ICD-10-CM | POA: Diagnosis not present

## 2019-04-29 DIAGNOSIS — R55 Syncope and collapse: Secondary | ICD-10-CM | POA: Diagnosis not present

## 2019-04-29 DIAGNOSIS — Z952 Presence of prosthetic heart valve: Secondary | ICD-10-CM | POA: Diagnosis not present

## 2019-04-29 DIAGNOSIS — I10 Essential (primary) hypertension: Secondary | ICD-10-CM

## 2019-04-29 DIAGNOSIS — I272 Pulmonary hypertension, unspecified: Secondary | ICD-10-CM | POA: Diagnosis not present

## 2019-04-29 MED ORDER — BISOPROLOL FUMARATE 5 MG PO TABS: 5.0000 mg | ORAL_TABLET | Freq: Every day | ORAL | 3 refills | Status: DC

## 2019-04-29 MED ORDER — AMLODIPINE BESYLATE 5 MG PO TABS: ORAL_TABLET | ORAL | 3 refills | Status: DC

## 2019-04-29 NOTE — Patient Instructions (Addendum)
Medication Instructions:  Your physician has recommended you make the following change in your medication:  1.  STOP the Diltiazem 2.  INCREASE the Bisoprolol to a whole tablet 3.  START Amlodipine 5 mg taking 1/2 tablet X's 3 days, if your blood pressure is still higher than 130 on the top, then increase it to 1 whole tablet daily    *If you need a refill on your cardiac medications before your next appointment, please call your pharmacy*  Lab Work: TODAY:  MAGNESIUM  If you have labs (blood work) drawn today and your tests are completely normal, you will receive your results only by: [name] MyChart Message (if you have MyChart) OR . A paper copy in the mail If you have any lab test that is abnormal or we need to change your treatment, we will call you to review the results.  Testing/Procedures: None ordered  Follow-Up: At [facility], you and your health needs are our priority.  As part of our continuing mission to provide you with exceptional heart care, we have created designated Provider Care Teams.  These Care Teams include your primary Cardiologist (physician) and Advanced Practice Providers (APPs -  Physician Assistants and Nurse Practitioners) who all work together to provide you with the care you need, when you need it.  Your next appointment:   05/11/2019 11:15   The format for your next appointment:   Virtual  Provider:   You may see Lauree Chandler, MD or one of the following Advanced Practice Providers on your designated Care Team:    [name], [name]  Ermalinda Barrios, [name]   Other Instructions YOUR CARDIOLOGY TEAM HAS ARRANGED FOR AN E-VISIT FOR YOUR APPOINTMENT - PLEASE REVIEW IMPORTANT INFORMATION BELOW SEVERAL DAYS PRIOR TO YOUR APPOINTMENT  Due to the recent COVID-19 pandemic, we are transitioning in-person office visits to tele-medicine visits in an effort to decrease unnecessary exposure to our patients, their families, and staff. These visits are billed  to your insurance just like a normal visit is. We also encourage you to sign up for MyChart if you have not already done so. You will need a smartphone if possible. For patients that do not have this, we can still complete the visit using a regular telephone but do prefer a smartphone to enable video when possible. You may have a family member that lives with you that can help. If possible, we also ask that you have a blood pressure cuff and scale at home to measure your blood pressure, heart rate and weight prior to your scheduled appointment. Patients with clinical needs that need an in-person evaluation and testing will still be able to come to the office if absolutely necessary. If you have any questions, feel free to call our office.     YOUR PROVIDER WILL BE USING THE FOLLOWING PLATFORM TO COMPLETE YOUR VISIT:   . IF USING MYCHART - How to Download the MyChart App to Your SmartPhone   - If Apple, go to [employer] and type in MyChart in the search bar and download the app. If Android, ask patient to go to [employer] and type in [place] in the search bar and download the app. The app is free but as with any other app downloads, your phone may require you to verify saved payment information or Apple/Android password.  - You will need to then log into the app with your MyChart username and password, and select [facility] as your healthcare provider to link  the account.  - When it is time for your visit, go to the MyChart app, find appointments, and click Begin Video Visit. Be sure to Select Allow for your device to access the Microphone and Camera for your visit. You will then be connected, and your provider will be with you shortly.  **If you have any issues connecting or need assistance, please contact MyChart service desk [phone])**  **If using a computer, in order to ensure the best quality for your visit, you will need to use either of the following Internet Browsers:  Insurance underwriter or [employer]**  . IF USING DOXIMITY or DOXY.ME - The staff will give you instructions on receiving your link to join the meeting the day of your visit.      THE DAY OF YOUR APPOINTMENT  Approximately 15 minutes prior to your scheduled appointment, you will receive a telephone call from one of [facility] team - your caller ID may say "Unknown caller."  Our staff will confirm medications, vital signs for the day and any symptoms you may be experiencing. Please have this information available prior to the time of visit start. It may also be helpful for you to have a pad of paper and pen handy for any instructions given during your visit. They will also walk you through joining the smartphone meeting if this is a video visit.    CONSENT FOR TELE-HEALTH VISIT - PLEASE REVIEW  I hereby voluntarily request, consent and authorize [facility] and its employed or contracted physicians, physician assistants, nurse practitioners or other licensed health care professionals (the Practitioner), to provide me with telemedicine health care services (the "Services") as deemed necessary by the treating Practitioner. I acknowledge and consent to receive the Services by the Practitioner via telemedicine. I understand that the telemedicine visit will involve communicating with the Practitioner through live audiovisual communication technology and the disclosure of certain medical information by electronic transmission. I acknowledge that I have been given the opportunity to request an in-person assessment or other available alternative prior to the telemedicine visit and am voluntarily participating in the telemedicine visit.  I understand that I have the right to withhold or withdraw my consent to the use of telemedicine in the course of my care at any time, without affecting my right to future care or treatment, and that the Practitioner or I may terminate the telemedicine visit at any time. I  understand that I have the right to inspect all information obtained and/or recorded in the course of the telemedicine visit and may receive copies of available information for a reasonable fee.  I understand that some of the potential risks of receiving the Services via telemedicine include:  [name] Delay or interruption in medical evaluation due to technological equipment failure or disruption; . Information transmitted may not be sufficient (e.g. poor resolution of images) to allow for appropriate medical decision making by the Practitioner; and/or  . In rare instances, security protocols could fail, causing a breach of personal health information.  Furthermore, I acknowledge that it is my responsibility to provide information about my medical history, conditions and care that is complete and accurate to the best of my ability. I acknowledge that Practitioner's advice, recommendations, and/or decision may be based on factors not within their control, such as incomplete or inaccurate data provided by me or distortions of diagnostic images or specimens that may result from electronic transmissions. I understand that the practice of medicine is not an exact science and that Practitioner  makes no warranties or guarantees regarding treatment outcomes. I acknowledge that I will receive a copy of this consent concurrently upon execution via email to the email address I last provided but may also request a printed copy by calling the office of [facility].    I understand that my insurance will be billed for this visit.   I have read or had this consent read to me. . I understand the contents of this consent, which adequately explains the benefits and risks of the Services being provided via telemedicine.  . I have been provided ample opportunity to ask questions regarding this consent and the Services and have had my questions answered to my satisfaction. . I give my informed consent for the services to be  provided through the use of telemedicine in my medical care  By participating in this telemedicine visit I agree to the above.

## 2019-04-30 ENCOUNTER — Other Ambulatory Visit: Payer: Medicare Other | Admitting: *Deleted

## 2019-04-30 DIAGNOSIS — R55 Syncope and collapse: Secondary | ICD-10-CM

## 2019-05-01 ENCOUNTER — Telehealth: Payer: Self-pay | Admitting: *Deleted

## 2019-05-01 DIAGNOSIS — Z79899 Other long term (current) drug therapy: Secondary | ICD-10-CM

## 2019-05-01 LAB — MAGNESIUM: Magnesium: 1.8 mg/dL (ref 1.6–2.3)

## 2019-05-01 MED ORDER — MAGNESIUM OXIDE 400 MG PO TABS: 400.0000 mg | ORAL_TABLET | Freq: Two times a day (BID) | ORAL | 11 refills | Status: DC

## 2019-05-01 NOTE — Telephone Encounter (Signed)
Spoke with patient and informed that based on results of event monitor Dr. Angelena Form is not recommending any changes to the plan that the patient and [name] discussed at last visit. Anthony Gould is pleased to hear this.

## 2019-05-01 NOTE — Telephone Encounter (Signed)
-----   Message from Burnell Blanks, MD sent at 05/01/2019  8:44 AM EST ----- Lisbeth Renshaw, I agree with your interpretation of his monitor and saw your plan from the recent visit I agree with this. I will ask Tiffannie Sloss to let him know that there are no changes based on his monitor. Gerald Stabs

## 2019-05-01 NOTE — Telephone Encounter (Signed)
-----   Message from Charlie Pitter, [place] sent at 05/01/2019  7:32 AM EST ----- Please let pt know Magnesium level still slightly suboptimal for h/o extra beats on monitor. Increase Magox to 400mg  BID. Please increase dietary intake of healthy sources of magnesium including leafy greens, nuts, seeds, fish, beans, whole grains, avocados, yogurt, and bananas. Recheck Mg level 1-2 weeks.

## 2019-05-08 ENCOUNTER — Other Ambulatory Visit: Payer: Medicare Other | Admitting: *Deleted

## 2019-05-08 ENCOUNTER — Other Ambulatory Visit: Payer: Self-pay

## 2019-05-08 DIAGNOSIS — Z79899 Other long term (current) drug therapy: Secondary | ICD-10-CM | POA: Diagnosis not present

## 2019-05-08 LAB — MAGNESIUM: Magnesium: 1.9 mg/dL (ref 1.6–2.3)

## 2019-05-08 NOTE — Progress Notes (Signed)
Virtual Visit via Telephone Note   This visit type was conducted due to national recommendations for restrictions regarding the COVID-19 Pandemic (e.g. social distancing) in an effort to limit this patient's exposure and mitigate transmission in our community.  Due to his co-morbid illnesses, this patient is at least at moderate risk for complications without adequate follow up.  This format is felt to be most appropriate for this patient at this time.  The patient did not have access to video technology/had technical difficulties with video requiring transitioning to audio format only (telephone).  All issues noted in this document were discussed and addressed.  No physical exam could be performed with this format.  Please refer to the patient's chart for his  consent to telehealth for [facility]. Virtual platform was offered given ongoing worsening Covid-19 pandemic.  Date:  05/11/2019   ID:  Anthony Gould, DOB 30-Aug-1933, MRN [id number]  Patient Location: Home Provider Location: [facility] Office  PCP:  Alain Marion, Evie Lacks, MD  Cardiologist:  Lauree Chandler, MD  Electrophysiologist:  None   Evaluation Performed:  Follow-Up Visit  Chief Complaint:  F/u BP and HR with medicine changes  History of Present Illness:    Anthony Gould is a 86 y.o. male with severe mitral regurgitation s/p Medronic Hall MVR in 1986, permanent atrial fibrillation, pulmonary HTN with elevated PASP, HTN, HLD (followed by PCP), venous insufficiency, PVCs, CKD stage III by labs, cirrhotic changes of the liver by imaging (followed by PCP), former ETOH use, DM, prior TIA on warfarin, recent syncope who presents for follow-up of blood pressure.  He has no prior history of coronary disease in his chart. He recalls being told he had clean coronaries before his MVR. He had a nuclear stress test in 2014 which was normal. Echo in 2016 showed normal LVF and normal functioning MVR. I reecntly saw him  virtually for evaluation of syncope while on the treadmill on 03/04/19. Interestingly he reports that ever since his original MVR, he has had intermittent episodes of very transient disorientiation that last about 10 seconds. The episodes used to be more frequent (5-10x/year) but then he got put on ASA and they decreased in frequency. He has also been maintained in dypridamole and warfarin by his cardiologists (Dr. Saunders Revel then Dr. Angelena Form). This now occurs maybe twice a year, unprovoked, without any other acute symptoms. He was walking on the treadmill in his [facility] on 03/04/19 when he felt that funny disorientation feeling. He reached out to the bars in front of him and the next thing he knew, he was on the floor and his arm was bleeding. There was not yet any blood dripping to the ground so he thinks he was only out a brief time. He was disoriented and initially thought he was on his bed. No b/b incontinence. He was able to get up and go walk to his wife in the other room. He declined to go to ED so saw his PCP. His vitals were unremarkable and orthostatics were negative. His Lasix was reduced but he had some recurrent fluid retention so this has since been adjusted. He was advised not to drive for 6 months. He had not had any other unusual symptoms of chest pain, SOB, edema, or palpitations. We arranged an echocardiogram 03/09/19 showing EF 55-60%, moderate LVH, normal RV, severe LAE/RAE, repaired/placed MV with trivial MR, moderate TR, moderately elevated pulmonary artery pressure -> no significant change from prior. Cardiac event monitor showed atrial fib with  average HR 80bpm, occasional PVCs, brief episodes of NSVT (vs aberrancy) 3-7 beats without sustained events, and rare episodic bradycardia in the mid 40s - mostly during sleeping hours but one occasion around 5pm. He declined neurology referral/EEG from PCP. He also declined referral to EP/discussion of loop.  When I saw him in follow-up 04/29/19, his blood  pressure was high. He wished to try and come off diltiazem as he was worried it was contributing to the slower HR that was noted. Therefore we increased his bisoprolol to 1 whole tablet (5mg  daily) and added amlodipine.  Labs personally reviewed include: TSH mildly elevated at 5.18 (normal fT4), INR 3.5, Mg 1.9, Hgb 13.0, K 4.0, Cr 1.34 (c/w prior), LFTs wnl.  We spoke today via the telephone for his follow-up visit. He reports that the medicine adjustments have "been a blessing." His blood pressures have been well controlled and his heart feel "smoother" and doesn't pound as much. HRs have been well controlled, 59-61 at night and with a 77bpm reading this afternoon. He is still able to remain active without significant fatigue, which he was pleased about since he had prior fatigue with metoprolol. No recurrent syncopal events.   Past Medical History:  Diagnosis Date   Blood transfusion without reported diagnosis    CKD (chronic kidney disease), stage III    Elevated TSH    History of mitral valve replacement    HTN (hypertension)    Hyperlipidemia    Mitral regurgitation    s/p MVR with Medtronic Hall MVR 1986   Normal nuclear stress test 12/25/01   no ischemia   Peripheral edema    venous insufficiency   Permanent atrial fibrillation (HCC)    Pulmonary hypertension (HCC)    TIA (transient ischemic attack)    while on coumadin   Type II or unspecified type diabetes mellitus without mention of complication, not stated as uncontrolled    lifestyle mangement   Ventricular ectopy    symptomatic   Past Surgical History:  Procedure Laterality Date   CHOLECYSTECTOMY, LAPAROSCOPIC  2003   MITRAL VALVE REPLACEMENT     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     Current Meds  Medication Sig   allopurinol (ZYLOPRIM) 300 MG tablet TAKE 1 TABLET BY MOUTH DAILY AS NEEDED. [place] (Patient taking differently: Take 300 mg by mouth daily. )    amLODipine (NORVASC) 5 MG tablet TAKE 1/2 TABLET BY MOUTH DAILY FOR 3 DAYS, IF BP IS STILL HIGHER THAN 130 THEN INCREASE TO 1 TABLET BY  MOUTH DAILY (Patient taking differently: Take 5 mg by mouth daily. TAKE 1/2 TABLET BY MOUTH DAILY FOR 3 DAYS, IF BP IS STILL HIGHER THAN 130 THEN INCREASE TO 1 TABLET BY  MOUTH DAILY)   aspirin 81 MG EC tablet Take 81 mg by mouth daily.     atorvastatin (LIPITOR) 20 MG tablet TAKE 1 TABLET BY MOUTH TWO TIMES A WEEK   bisoprolol (ZEBETA) 5 MG tablet Take 1 tablet (5 mg total) by mouth daily.   celecoxib (CELEBREX) 200 MG capsule Take 200 mg by mouth 2 (two) times daily. As needed   dipyridamole (PERSANTINE) 50 MG tablet TAKE 1 TABLET BY MOUTH THREE TIMES DAILY   enalapril (VASOTEC) 20 MG tablet Take 1 tablet (20 mg total) by mouth daily.   furosemide (LASIX) 40 MG tablet Take 0.5 tablets (20 mg total) by mouth daily. (Patient taking differently: TAKE 1 TABLET BY MOUTH DAILY EXCEPT 1/2 TABLET EVERY  3RD DAY)   latanoprost (XALATAN) 0.005 % ophthalmic solution Place 1 drop into both eyes at bedtime.   magnesium oxide (MAG-OX) 400 MG tablet Take 1 tablet (400 mg total) by mouth 2 (two) times daily.   Milk Thistle 1000 MG CAPS Take 1 capsule by mouth daily.   pantoprazole (PROTONIX) 40 MG tablet TAKE 1 TABLET BY MOUTH DAILY   warfarin (COUMADIN) 5 MG tablet TAKE BY MOUTH AS DIRECTED BY THE ANTICOAGULATION CLINIC. GENERIC EQUIVALENT FOR COUMADIN.     Allergies:   Sulfonamide derivatives   Social History   Tobacco Use   Smoking status: Never Smoker   Smokeless tobacco: Never Used  Substance Use Topics   Alcohol use: No    Alcohol/week: 0.0 standard drinks   Drug use: No     Family Hx: The patient's family history includes Coronary artery disease in his father and another family member; Diabetes in an other family member. There is no history of Prostate cancer or Colon cancer.  ROS:   Please see the history of present illness.  He reports his  weight went back to baseline after switching the Lasix to current regimen and he's felt well. All other systems reviewed and are negative.   Prior CV studies:    Most recent pertinent cardiac studies are outlined and summarized above. Reports included below if pertinent.  2D echo 03/09/19 1. Left ventricular ejection fraction, by visual estimation, is 55 to  60%. The left ventricle has normal function. There is moderately increased  left ventricular hypertrophy.  2. Left ventricular diastolic function could not be evaluated.  3. The left ventricle has no regional wall motion abnormalities.  4. Global right ventricle has normal systolic function.The right  ventricular size is normal. No increase in right ventricular wall  thickness.  5. Left atrial size was severely dilated.  6. Right atrial size was severely dilated.  7. The mitral valve has been repaired/replaced. Trivial mitral valve  regurgitation.  8. The tricuspid valve is normal in structure. Tricuspid valve  regurgitation moderate.  9. The aortic valve is normal in structure. Aortic valve regurgitation is  not visualized. No evidence of aortic valve sclerosis or stenosis.  10. The pulmonic valve was grossly normal. Pulmonic valve regurgitation is  trivial.  11. Moderately elevated pulmonary artery systolic pressure.  12. The atrial septum is grossly normal.  Event monitor reported out 04/2019 Atrial fibrillation with nocturnal bradycardia Frequent premature ventricular contractions with several short runs of non-sustained ventricular tachycardia    Labs/Other Tests and Data Reviewed:    EKG:  An ECG dated 03/05/19 was personally reviewed today and demonstrated:  atrial fib 72bpm with occasional PVC, nonspecific QRS widening, similar to prior  Recent Labs: 03/05/2019: ALT 18; BUN 19; Creatinine, Ser 1.34; Hemoglobin 13.0; Platelets 211.0; Potassium 4.0; Sodium 139; TSH 5.18 05/08/2019: Magnesium 1.9   Recent  Lipid Panel Lab Results  Component Value Date/Time   CHOL 140 04/22/2017 09:05 AM   TRIG 113.0 04/22/2017 09:05 AM   HDL 48.50 04/22/2017 09:05 AM   CHOLHDL 3 04/22/2017 09:05 AM   LDLCALC 69 04/22/2017 09:05 AM    Wt Readings from Last 3 Encounters:  05/11/19 163 lb (73.9 kg)  04/29/19 172 lb 8 oz (78.2 kg)  04/02/19 176 lb (79.8 kg)     Objective:    Vital Signs:  BP 126/68    Pulse 77    Ht 5\' 10"  (1.778 m)    Wt 163 lb (73.9 kg)  BMI 23.39 kg/m    VS reviewed. General - pleasant M in no acute distress Pulm - No labored breathing, no coughing during visit, no audible wheezing, speaking in full sentences Neuro - A+Ox3, no slurred speech, answers questions appropriately Psych - Pleasant affect    ASSESSMENT & PLAN:    1. Essential HTN - well controlled with recent medicine changes. He is pleased with how he feels with the changes. Will continue present regimen. 2. Atrial fibrillation (permanent) - heart rate remains well controlled with recent adjustments. Continue beta blocker as outlined. Fortunately he is tolerating bisoprolol well. We will continue present regimen. 3. History of syncope/NSVT - see last OV for further comprehensive assessment/recommendations. The patient declined referral to EP for consideration of loop. I had reviewed with Dr. Angelena Form after last visit who agreed with plan.  COVID-19 Education: Have discussed with patient previously so did not revisit today.  Time:   Today, I have spent 15 minutes with the patient with telehealth technology discussing the above problems.   Medication Adjustments/Labs and Tests Ordered: Current medicines are reviewed at length with the patient today.  Concerns regarding medicines are outlined above.   Follow Up:  6 months with Dr. Angelena Form   Signed, Charlie Pitter, [name]  05/11/2019 11:32 AM    [facility]

## 2019-05-11 ENCOUNTER — Telehealth (INDEPENDENT_AMBULATORY_CARE_PROVIDER_SITE_OTHER): Payer: Medicare Other | Admitting: Physician Assistant

## 2019-05-11 ENCOUNTER — Encounter: Payer: Self-pay | Admitting: Physician Assistant

## 2019-05-11 ENCOUNTER — Other Ambulatory Visit: Payer: Self-pay

## 2019-05-11 VITALS — BP 126/68 | HR 77 | Ht 70.0 in | Wt 163.0 lb

## 2019-05-11 DIAGNOSIS — I4729 Other ventricular tachycardia: Secondary | ICD-10-CM

## 2019-05-11 DIAGNOSIS — I472 Ventricular tachycardia: Secondary | ICD-10-CM

## 2019-05-11 DIAGNOSIS — I10 Essential (primary) hypertension: Secondary | ICD-10-CM

## 2019-05-11 DIAGNOSIS — Z87898 Personal history of other specified conditions: Secondary | ICD-10-CM

## 2019-05-11 DIAGNOSIS — I4821 Permanent atrial fibrillation: Secondary | ICD-10-CM

## 2019-05-11 NOTE — Patient Instructions (Signed)
Medication Instructions:  Your physician recommends that you continue on your current medications as directed. Please refer to the Current Medication list given to you today.  *If you need a refill on your cardiac medications before your next appointment, please call your pharmacy*  Lab Work: None ordered  If you have labs (blood work) drawn today and your tests are completely normal, you will receive your results only by: . MyChart Message (if you have MyChart) OR . A paper copy in the mail If you have any lab test that is abnormal or we need to change your treatment, we will call you to review the results.  Testing/Procedures: None ordered  Follow-Up: At [facility], you and your health needs are our priority.  As part of our continuing mission to provide you with exceptional heart care, we have created designated Provider Care Teams.  These Care Teams include your primary Cardiologist (physician) and Advanced Practice Providers (APPs -  Physician Assistants and Nurse Practitioners) who all work together to provide you with the care you need, when you need it.  Your next appointment:   6 month(s)  The format for your next appointment:   In Person  Provider:   You may see Christopher McAlhany, MD or one of the following Advanced Practice Providers on your designated Care Team:    [name] Dunn, [name]  Michele Lenze, [name]   Other Instructions   

## 2019-05-19 DIAGNOSIS — H40053 Ocular hypertension, bilateral: Secondary | ICD-10-CM | POA: Diagnosis not present

## 2019-05-19 DIAGNOSIS — H26493 Other secondary cataract, bilateral: Secondary | ICD-10-CM | POA: Diagnosis not present

## 2019-05-19 DIAGNOSIS — H35033 Hypertensive retinopathy, bilateral: Secondary | ICD-10-CM | POA: Diagnosis not present

## 2019-05-19 DIAGNOSIS — H3562 Retinal hemorrhage, left eye: Secondary | ICD-10-CM | POA: Diagnosis not present

## 2019-05-24 ENCOUNTER — Ambulatory Visit: Payer: Medicare Other | Attending: Internal Medicine

## 2019-05-24 DIAGNOSIS — Z23 Encounter for immunization: Secondary | ICD-10-CM

## 2019-05-24 NOTE — Progress Notes (Signed)
   Covid-19 Vaccination Clinic  Name:  Anthony Gould    MRN: [id number] DOB: [birth year]  05/24/2019  [name] was observed post Covid-19 immunization for 15 minutes without incidence. He was provided with Vaccine Information Sheet and instruction to access the V-Safe system.   [name] was instructed to call 911 with any severe reactions post vaccine: [name] Difficulty breathing  . Swelling of your face and throat  . A fast heartbeat  . A bad rash all over your body  . Dizziness and weakness    Immunizations Administered    Name Date Dose VIS Date Route   Pfizer COVID-19 Vaccine 05/24/2019 10:21 AM 0.3 mL 03/06/2019 Intramuscular   Manufacturer: [place]   Lot: [id number]   [place]: [id number]

## 2019-05-29 ENCOUNTER — Other Ambulatory Visit: Payer: Self-pay

## 2019-05-29 ENCOUNTER — Ambulatory Visit (INDEPENDENT_AMBULATORY_CARE_PROVIDER_SITE_OTHER): Payer: Medicare Other | Admitting: Pharmacist

## 2019-05-29 ENCOUNTER — Other Ambulatory Visit: Payer: Self-pay | Admitting: Internal Medicine

## 2019-05-29 DIAGNOSIS — I059 Rheumatic mitral valve disease, unspecified: Secondary | ICD-10-CM | POA: Diagnosis not present

## 2019-05-29 DIAGNOSIS — Z7901 Long term (current) use of anticoagulants: Secondary | ICD-10-CM | POA: Diagnosis not present

## 2019-05-29 DIAGNOSIS — I6350 Cerebral infarction due to unspecified occlusion or stenosis of unspecified cerebral artery: Secondary | ICD-10-CM

## 2019-05-29 DIAGNOSIS — Z9889 Other specified postprocedural states: Secondary | ICD-10-CM

## 2019-05-29 DIAGNOSIS — I10 Essential (primary) hypertension: Secondary | ICD-10-CM

## 2019-05-29 DIAGNOSIS — I4821 Permanent atrial fibrillation: Secondary | ICD-10-CM

## 2019-05-29 LAB — POCT INR: INR: 2.7 (ref 2.0–3.0)

## 2019-05-29 NOTE — Patient Instructions (Signed)
Description   Take an extra 0.5 tablet today, then continue taking 5mg  daily except 7.5mg  on Mondays. Recheck in 4 weeks. Coumadin Clinic [phone]

## 2019-06-17 ENCOUNTER — Other Ambulatory Visit (HOSPITAL_COMMUNITY): Payer: Medicare Other

## 2019-06-23 ENCOUNTER — Ambulatory Visit: Payer: Medicare Other | Attending: Internal Medicine

## 2019-06-23 DIAGNOSIS — Z23 Encounter for immunization: Secondary | ICD-10-CM

## 2019-06-23 NOTE — Progress Notes (Signed)
   Covid-19 Vaccination Clinic  Name:  Anthony Gould    MRN: [id number] DOB: 1933/09/26  06/23/2019  Mr. Rindal was observed post Covid-19 immunization for 15 minutes without incident. He was provided with Vaccine Information Sheet and instruction to access the V-Safe system.   Mr. Hartunian was instructed to call 911 with any severe reactions post vaccine: [name] Difficulty breathing  . Swelling of face and throat  . A fast heartbeat  . A bad rash all over body  . Dizziness and weakness   Immunizations Administered    Name Date Dose VIS Date Route   Pfizer COVID-19 Vaccine 06/23/2019  9:51 AM 0.3 mL 03/06/2019 Intramuscular   Manufacturer: [place]   Lot: [id number]   [place]: [id number]

## 2019-06-23 NOTE — Progress Notes (Signed)
   Covid-19 Vaccination Clinic  Name:  Anthony Gould    MRN: [id number] DOB: 02-23-1934  06/23/2019  Anthony Gould was observed post Covid-19 immunization for 15 minutes without incident. He was provided with Vaccine Information Sheet and instruction to access the V-Safe system.   Anthony Gould was instructed to call [phone] with any severe reactions post vaccine: [name] Difficulty breathing  . Swelling of face and throat  . A fast heartbeat  . A bad rash all over body  . Dizziness and weakness   Immunizations Administered    Name Date Dose VIS Date Route   Pfizer COVID-19 Vaccine 06/23/2019  9:51 AM 0.3 mL 03/06/2019 Intramuscular   Manufacturer: [place]   Lot: [id number]   [place]: [id number]

## 2019-06-26 ENCOUNTER — Ambulatory Visit: Payer: Medicare Other | Admitting: Pharmacist

## 2019-06-26 ENCOUNTER — Other Ambulatory Visit: Payer: Self-pay

## 2019-06-26 DIAGNOSIS — I6350 Cerebral infarction due to unspecified occlusion or stenosis of unspecified cerebral artery: Secondary | ICD-10-CM

## 2019-06-26 DIAGNOSIS — Z9889 Other specified postprocedural states: Secondary | ICD-10-CM | POA: Diagnosis not present

## 2019-06-26 DIAGNOSIS — I059 Rheumatic mitral valve disease, unspecified: Secondary | ICD-10-CM | POA: Diagnosis not present

## 2019-06-26 DIAGNOSIS — I4821 Permanent atrial fibrillation: Secondary | ICD-10-CM | POA: Diagnosis not present

## 2019-06-26 DIAGNOSIS — Z7901 Long term (current) use of anticoagulants: Secondary | ICD-10-CM | POA: Diagnosis not present

## 2019-06-26 DIAGNOSIS — G459 Transient cerebral ischemic attack, unspecified: Secondary | ICD-10-CM

## 2019-06-26 LAB — POCT INR: INR: 3.2 — AB (ref 2.0–3.0)

## 2019-06-26 NOTE — Patient Instructions (Signed)
Continue taking 5mg  daily except 7.5mg  on Mondays. Recheck in 4 weeks. [facility] [phone]

## 2019-07-01 ENCOUNTER — Encounter: Payer: Self-pay | Admitting: Internal Medicine

## 2019-07-01 ENCOUNTER — Other Ambulatory Visit: Payer: Self-pay

## 2019-07-01 ENCOUNTER — Ambulatory Visit (INDEPENDENT_AMBULATORY_CARE_PROVIDER_SITE_OTHER): Payer: Medicare Other | Admitting: Internal Medicine

## 2019-07-01 DIAGNOSIS — I10 Essential (primary) hypertension: Secondary | ICD-10-CM

## 2019-07-01 DIAGNOSIS — I480 Paroxysmal atrial fibrillation: Secondary | ICD-10-CM

## 2019-07-01 DIAGNOSIS — I472 Ventricular tachycardia: Secondary | ICD-10-CM

## 2019-07-01 DIAGNOSIS — K7460 Unspecified cirrhosis of liver: Secondary | ICD-10-CM | POA: Diagnosis not present

## 2019-07-01 DIAGNOSIS — E1159 Type 2 diabetes mellitus with other circulatory complications: Secondary | ICD-10-CM | POA: Diagnosis not present

## 2019-07-01 DIAGNOSIS — I4729 Other ventricular tachycardia: Secondary | ICD-10-CM

## 2019-07-01 DIAGNOSIS — I4821 Permanent atrial fibrillation: Secondary | ICD-10-CM

## 2019-07-01 LAB — HEPATIC FUNCTION PANEL
ALT: 38 U/L (ref 0–53)
AST: 43 U/L — ABNORMAL HIGH (ref 0–37)
Albumin: 4.3 g/dL (ref 3.5–5.2)
Alkaline Phosphatase: 82 U/L (ref 39–117)
Bilirubin, Direct: 0.2 mg/dL (ref 0.0–0.3)
Total Bilirubin: 0.9 mg/dL (ref 0.2–1.2)
Total Protein: 7.1 g/dL (ref 6.0–8.3)

## 2019-07-01 LAB — BASIC METABOLIC PANEL
BUN: 35 mg/dL — ABNORMAL HIGH (ref 6–23)
CO2: 27 mEq/L (ref 19–32)
Calcium: 9.6 mg/dL (ref 8.4–10.5)
Chloride: 103 mEq/L (ref 96–112)
Creatinine, Ser: 1.22 mg/dL (ref 0.40–1.50)
GFR: 56.59 mL/min — ABNORMAL LOW (ref >60.00)
Glucose, Bld: 101 mg/dL — ABNORMAL HIGH (ref 70–99)
Potassium: 4.3 mEq/L (ref 3.5–5.1)
Sodium: 137 mEq/L (ref 135–145)

## 2019-07-01 LAB — HEMOGLOBIN A1C: Hgb A1c MFr Bld: 5.7 % (ref 4.6–6.5)

## 2019-07-01 MED ORDER — FUROSEMIDE 40 MG PO TABS: 40.0000 mg | ORAL_TABLET | Freq: Every day | ORAL | 3 refills | Status: DC

## 2019-07-01 NOTE — Progress Notes (Signed)
Subjective:  Patient ID: Anthony Gould, male    DOB: 1934/03/13  Age: 86 y.o. MRN: [id number]  CC: No chief complaint on file.   HPI Anthony Gould presents for syncope, HTN, dyslipidemia Doing well on Amlodipine. BP OK at home   Outpatient Medications Prior to Visit  Medication Sig Dispense Refill  . allopurinol (ZYLOPRIM) 300 MG tablet TAKE 1 TABLET BY MOUTH DAILY AS NEEDED. GENERIC EQUIVALENT FOR ZYLOPRIM (Patient taking differently: Take 300 mg by mouth daily. ) 90 tablet 3  . amLODipine (NORVASC) 5 MG tablet Take 5 mg by mouth daily.    [name] aspirin 81 MG EC tablet Take 81 mg by mouth daily.      [name] atorvastatin (LIPITOR) 20 MG tablet TAKE 1 TABLET BY MOUTH TWO TIMES A WEEK 26 tablet 1  . bisoprolol (ZEBETA) 5 MG tablet Take 1 tablet (5 mg total) by mouth daily. 90 tablet 3  . celecoxib (CELEBREX) 200 MG capsule Take 200 mg by mouth 2 (two) times daily. As needed    . dipyridamole (PERSANTINE) 50 MG tablet TAKE 1 TABLET BY MOUTH THREE TIMES DAILY 270 tablet 2  . enalapril (VASOTEC) 20 MG tablet Take 1 tablet (20 mg total) by mouth daily. 90 tablet 3  . furosemide (LASIX) 40 MG tablet Take 0.5 tablets (20 mg total) by mouth daily. 45 tablet 3  . latanoprost (XALATAN) 0.005 % ophthalmic solution Place 1 drop into both eyes at bedtime.    . magnesium oxide (MAG-OX) 400 MG tablet Take 1 tablet (400 mg total) by mouth 2 (two) times daily. 60 tablet 11  . Milk Thistle 1000 MG CAPS Take 1 capsule by mouth daily.    . pantoprazole (PROTONIX) 40 MG tablet TAKE 1 TABLET BY MOUTH DAILY 90 tablet 0  . warfarin (COUMADIN) 5 MG tablet TAKE BY MOUTH AS DIRECTED BY THE ANTICOAGULATION CLINIC. GENERIC EQUIVALENT FOR COUMADIN. 120 tablet 1   No facility-administered medications prior to visit.    ROS: Review of Systems  Constitutional: Negative for appetite change, fatigue and unexpected weight change.  HENT: Negative for congestion, nosebleeds, sneezing, sore throat and trouble swallowing.    Eyes: Negative for itching and visual disturbance.  Respiratory: Negative for cough.   Cardiovascular: Negative for chest pain, palpitations and leg swelling.  Gastrointestinal: Negative for abdominal distention, blood in stool, diarrhea and nausea.  Genitourinary: Negative for frequency and hematuria.  Musculoskeletal: Negative for back pain, gait problem, joint swelling and neck pain.  Skin: Negative for rash.  Neurological: Negative for dizziness, tremors, speech difficulty and weakness.  Psychiatric/Behavioral: Negative for agitation, dysphoric mood and sleep disturbance. The patient is not nervous/anxious.     Objective:  BP (!) 150/80 (BP Location: Left Arm, Patient Position: Sitting, Cuff Size: Normal)   Pulse 76   Temp 97.8 F (36.6 C) (Oral)   Ht 5\' 10"  (1.778 m)   Wt 173 lb (78.5 kg)   SpO2 97%   BMI 24.82 kg/m   BP Readings from Last 3 Encounters:  07/01/19 (!) 150/80  05/11/19 126/68  04/29/19 (!) 148/72    Wt Readings from Last 3 Encounters:  07/01/19 173 lb (78.5 kg)  05/11/19 163 lb (73.9 kg)  04/29/19 172 lb 8 oz (78.2 kg)    Physical Exam Constitutional:      General: He is not in acute distress.    Appearance: He is well-developed.     Comments: NAD  Eyes:     Conjunctiva/sclera: Conjunctivae normal.  Pupils: Pupils are equal, round, and reactive to light.  Neck:     Thyroid: No thyromegaly.     Vascular: No JVD.  Cardiovascular:     Rate and Rhythm: Normal rate and regular rhythm.     Heart sounds: Normal heart sounds. No murmur. No friction rub. No gallop.   Pulmonary:     Effort: Pulmonary effort is normal. No respiratory distress.     Breath sounds: Normal breath sounds. No wheezing or rales.  Chest:     Chest wall: No tenderness.  Abdominal:     General: Bowel sounds are normal. There is no distension.     Palpations: Abdomen is soft. There is no mass.     Tenderness: There is no abdominal tenderness. There is no guarding or  rebound.  Musculoskeletal:        General: No tenderness. Normal range of motion.     Cervical back: Normal range of motion.  Lymphadenopathy:     Cervical: No cervical adenopathy.  Skin:    General: Skin is warm and dry.     Findings: No rash.  Neurological:     Mental Status: He is alert and oriented to person, place, and time.     Cranial Nerves: No cranial nerve deficit.     Motor: No abnormal muscle tone.     Coordination: Coordination normal.     Gait: Gait normal.     Deep Tendon Reflexes: Reflexes are normal and symmetric.  Psychiatric:        Behavior: Behavior normal.        Thought Content: Thought content normal.        Judgment: Judgment normal.     Lab Results  Component Value Date   WBC 7.4 03/05/2019   HGB 13.0 03/05/2019   HCT 38.7 (L) 03/05/2019   PLT 211.0 03/05/2019   GLUCOSE 150 (H) 03/05/2019   CHOL 140 04/22/2017   TRIG 113.0 04/22/2017   HDL 48.50 04/22/2017   LDLCALC 69 04/22/2017   ALT 18 03/05/2019   AST 27 03/05/2019   NA 139 03/05/2019   K 4.0 03/05/2019   CL 102 03/05/2019   CREATININE 1.34 03/05/2019   BUN 19 03/05/2019   CO2 27 03/05/2019   TSH 5.18 (H) 03/05/2019   PSA 0.87 10/20/2014   INR 3.2 (A) 06/26/2019   HGBA1C 5.9 11/26/2018    No results found.  Assessment & Plan:      Walker Kehr, MD

## 2019-07-01 NOTE — Assessment & Plan Note (Addendum)
Doing well on Amlodipine. BP OK at home No relapse

## 2019-07-01 NOTE — Assessment & Plan Note (Signed)
Labs

## 2019-07-01 NOTE — Assessment & Plan Note (Signed)
Doing well on Amlodipine. BP OK at home

## 2019-07-01 NOTE — Assessment & Plan Note (Signed)
LFTs INR

## 2019-07-13 ENCOUNTER — Other Ambulatory Visit: Payer: Self-pay | Admitting: Internal Medicine

## 2019-07-13 ENCOUNTER — Other Ambulatory Visit: Payer: Self-pay

## 2019-07-13 DIAGNOSIS — Z952 Presence of prosthetic heart valve: Secondary | ICD-10-CM

## 2019-07-13 DIAGNOSIS — I4821 Permanent atrial fibrillation: Secondary | ICD-10-CM

## 2019-07-13 DIAGNOSIS — I10 Essential (primary) hypertension: Secondary | ICD-10-CM

## 2019-07-13 MED ORDER — DIPYRIDAMOLE 50 MG PO TABS: 50.0000 mg | ORAL_TABLET | Freq: Three times a day (TID) | ORAL | 3 refills | Status: DC

## 2019-07-24 ENCOUNTER — Ambulatory Visit: Payer: Medicare Other | Admitting: *Deleted

## 2019-07-24 DIAGNOSIS — I4821 Permanent atrial fibrillation: Secondary | ICD-10-CM | POA: Diagnosis not present

## 2019-07-24 DIAGNOSIS — Z7901 Long term (current) use of anticoagulants: Secondary | ICD-10-CM | POA: Diagnosis not present

## 2019-07-24 DIAGNOSIS — G459 Transient cerebral ischemic attack, unspecified: Secondary | ICD-10-CM

## 2019-07-24 DIAGNOSIS — Z9889 Other specified postprocedural states: Secondary | ICD-10-CM

## 2019-07-24 DIAGNOSIS — I059 Rheumatic mitral valve disease, unspecified: Secondary | ICD-10-CM

## 2019-07-24 DIAGNOSIS — I6350 Cerebral infarction due to unspecified occlusion or stenosis of unspecified cerebral artery: Secondary | ICD-10-CM

## 2019-07-24 LAB — POCT INR: INR: 3.9 — AB (ref 2.0–3.0)

## 2019-07-24 NOTE — Patient Instructions (Signed)
Description   Tomorrow take 2.5mg  then continue taking 5mg  daily except 7.5mg  on Mondays. Recheck in 4 weeks. [facility] [phone]

## 2019-08-21 ENCOUNTER — Other Ambulatory Visit: Payer: Self-pay

## 2019-08-21 ENCOUNTER — Ambulatory Visit: Payer: Medicare Other

## 2019-08-21 DIAGNOSIS — I6350 Cerebral infarction due to unspecified occlusion or stenosis of unspecified cerebral artery: Secondary | ICD-10-CM

## 2019-08-21 DIAGNOSIS — I4821 Permanent atrial fibrillation: Secondary | ICD-10-CM | POA: Diagnosis not present

## 2019-08-21 DIAGNOSIS — G459 Transient cerebral ischemic attack, unspecified: Secondary | ICD-10-CM

## 2019-08-21 DIAGNOSIS — Z9889 Other specified postprocedural states: Secondary | ICD-10-CM | POA: Diagnosis not present

## 2019-08-21 DIAGNOSIS — I059 Rheumatic mitral valve disease, unspecified: Secondary | ICD-10-CM

## 2019-08-21 DIAGNOSIS — Z7901 Long term (current) use of anticoagulants: Secondary | ICD-10-CM

## 2019-08-21 LAB — POCT INR: INR: 3.9 — AB (ref 2.0–3.0)

## 2019-08-21 NOTE — Patient Instructions (Signed)
Description   Skip tomorrow's dosage of Warfarin, then resume same dosage 5mg  daily except 7.5mg  on Mondays. Recheck in 4 weeks. [facility] [phone]

## 2019-09-18 ENCOUNTER — Ambulatory Visit: Payer: Medicare Other | Admitting: Pharmacist

## 2019-09-18 ENCOUNTER — Other Ambulatory Visit: Payer: Self-pay

## 2019-09-18 DIAGNOSIS — I6350 Cerebral infarction due to unspecified occlusion or stenosis of unspecified cerebral artery: Secondary | ICD-10-CM

## 2019-09-18 DIAGNOSIS — Z9889 Other specified postprocedural states: Secondary | ICD-10-CM

## 2019-09-18 DIAGNOSIS — I4891 Unspecified atrial fibrillation: Secondary | ICD-10-CM | POA: Diagnosis not present

## 2019-09-18 DIAGNOSIS — I059 Rheumatic mitral valve disease, unspecified: Secondary | ICD-10-CM

## 2019-09-18 DIAGNOSIS — Z7901 Long term (current) use of anticoagulants: Secondary | ICD-10-CM

## 2019-09-18 LAB — POCT INR: INR: 3.7 — AB (ref 2.0–3.0)

## 2019-09-18 NOTE — Patient Instructions (Signed)
Skip tomorrow's dosage of Warfarin, start taking dosage 5mg  daily. Recheck in 3 weeks. [facility] [phone]

## 2019-10-01 ENCOUNTER — Other Ambulatory Visit: Payer: Self-pay

## 2019-10-01 ENCOUNTER — Ambulatory Visit (INDEPENDENT_AMBULATORY_CARE_PROVIDER_SITE_OTHER): Payer: Medicare Other | Admitting: Internal Medicine

## 2019-10-01 ENCOUNTER — Encounter: Payer: Self-pay | Admitting: Internal Medicine

## 2019-10-01 VITALS — BP 160/78 | HR 71 | Temp 98.1°F | Wt 174.6 lb

## 2019-10-01 DIAGNOSIS — H9193 Unspecified hearing loss, bilateral: Secondary | ICD-10-CM

## 2019-10-01 DIAGNOSIS — E785 Hyperlipidemia, unspecified: Secondary | ICD-10-CM

## 2019-10-01 DIAGNOSIS — I10 Essential (primary) hypertension: Secondary | ICD-10-CM | POA: Diagnosis not present

## 2019-10-01 DIAGNOSIS — D485 Neoplasm of uncertain behavior of skin: Secondary | ICD-10-CM | POA: Diagnosis not present

## 2019-10-01 DIAGNOSIS — M1000 Idiopathic gout, unspecified site: Secondary | ICD-10-CM

## 2019-10-01 DIAGNOSIS — E1159 Type 2 diabetes mellitus with other circulatory complications: Secondary | ICD-10-CM

## 2019-10-01 MED ORDER — VITAMIN D3 50 MCG (2000 UT) PO CAPS
2000.0000 [IU] | ORAL_CAPSULE | Freq: Every day | ORAL | 3 refills | Status: DC
Start: 2019-10-01 — End: 2021-02-23

## 2019-10-01 NOTE — Assessment & Plan Note (Signed)
Diltiazem, Bisoprolol, Enalapril, Furosemide

## 2019-10-01 NOTE — Assessment & Plan Note (Signed)
ENT ref 

## 2019-10-01 NOTE — Assessment & Plan Note (Signed)
No relapse Allopurinol

## 2019-10-01 NOTE — Progress Notes (Signed)
Subjective:  Patient ID: Anthony Gould, male    DOB: March 26, 1934  Age: 86 y.o. MRN: [id number]  CC: Follow-up (3 MONTH FOLLOW-UP)   HPI GUERINO CAPORALE presents for gout, HTN, GERD f/u C/o decreased hearing  Outpatient Medications Prior to Visit  Medication Sig Dispense Refill   allopurinol (ZYLOPRIM) 300 MG tablet TAKE 1 TABLET BY MOUTH DAILY AS NEEDED. GENERIC EQUIVALENT FOR ZYLOPRIM (Patient taking differently: Take 300 mg by mouth daily. ) 90 tablet 3   amLODipine (NORVASC) 5 MG tablet Take 5 mg by mouth daily.     aspirin 81 MG EC tablet Take 81 mg by mouth daily.       atorvastatin (LIPITOR) 20 MG tablet TAKE 1 TABLET BY MOUTH TWO TIMES A WEEK 26 tablet 1   bisoprolol (ZEBETA) 5 MG tablet Take 1 tablet (5 mg total) by mouth daily. 90 tablet 3   celecoxib (CELEBREX) 200 MG capsule Take 200 mg by mouth 2 (two) times daily. As needed     dipyridamole (PERSANTINE) 50 MG tablet Take 1 tablet (50 mg total) by mouth 3 (three) times daily. 270 tablet 3   enalapril (VASOTEC) 20 MG tablet TAKE 1 TABLET BY MOUTH DAILY. 90 tablet 3   furosemide (LASIX) 40 MG tablet Take 1 tablet (40 mg total) by mouth daily. 90 tablet 3   latanoprost (XALATAN) 0.005 % ophthalmic solution Place 1 drop into both eyes at bedtime.     magnesium oxide (MAG-OX) 400 MG tablet Take 1 tablet (400 mg total) by mouth 2 (two) times daily. 60 tablet 11   Milk Thistle 1000 MG CAPS Take 1 capsule by mouth daily.     pantoprazole (PROTONIX) 40 MG tablet TAKE 1 TABLET BY MOUTH DAILY 90 tablet 3   warfarin (COUMADIN) 5 MG tablet TAKE BY MOUTH AS DIRECTED BY THE ANTICOAGULATION CLINIC. GENERIC EQUIVALENT FOR COUMADIN. 120 tablet 1   No facility-administered medications prior to visit.    ROS: Review of Systems  Constitutional: Negative for appetite change, fatigue and unexpected weight change.  HENT: Positive for hearing loss. Negative for congestion, nosebleeds, sneezing, sore throat and trouble  swallowing.   Eyes: Negative for itching and visual disturbance.  Respiratory: Negative for cough.   Cardiovascular: Negative for chest pain, palpitations and leg swelling.  Gastrointestinal: Negative for abdominal distention, blood in stool, diarrhea and nausea.  Genitourinary: Negative for frequency and hematuria.  Musculoskeletal: Negative for back pain, gait problem, joint swelling and neck pain.  Skin: Negative for rash.  Neurological: Negative for dizziness, tremors, speech difficulty and weakness.  Psychiatric/Behavioral: Negative for agitation, dysphoric mood and sleep disturbance. The patient is not nervous/anxious.     Objective:  BP (!) 160/78 (BP Location: Left Arm)    Pulse 71    Temp 98.1 F (36.7 C) (Oral)    Wt 174 lb 9.6 oz (79.2 kg)    SpO2 98%    BMI 25.05 kg/m   BP Readings from Last 3 Encounters:  10/01/19 (!) 160/78  07/01/19 (!) 150/80  05/11/19 126/68    Wt Readings from Last 3 Encounters:  10/01/19 174 lb 9.6 oz (79.2 kg)  07/01/19 173 lb (78.5 kg)  05/11/19 163 lb (73.9 kg)    Physical Exam Constitutional:      General: He is not in acute distress.    Appearance: He is well-developed.     Comments: NAD  Eyes:     Conjunctiva/sclera: Conjunctivae normal.     Pupils: Pupils are equal,  round, and reactive to light.  Neck:     Thyroid: No thyromegaly.     Vascular: No JVD.  Cardiovascular:     Rate and Rhythm: Normal rate and regular rhythm.     Heart sounds: Normal heart sounds. No murmur heard.  No friction rub. No gallop.   Pulmonary:     Effort: Pulmonary effort is normal. No respiratory distress.     Breath sounds: Normal breath sounds. No wheezing or rales.  Chest:     Chest wall: No tenderness.  Abdominal:     General: Bowel sounds are normal. There is no distension.     Palpations: Abdomen is soft. There is no mass.     Tenderness: There is no abdominal tenderness. There is no guarding or rebound.  Musculoskeletal:        General: No  tenderness. Normal range of motion.     Cervical back: Normal range of motion.  Lymphadenopathy:     Cervical: No cervical adenopathy.  Skin:    General: Skin is warm and dry.     Findings: No rash.  Neurological:     Mental Status: He is alert and oriented to person, place, and time.     Cranial Nerves: No cranial nerve deficit.     Motor: No abnormal muscle tone.     Coordination: Coordination normal.     Gait: Gait normal.     Deep Tendon Reflexes: Reflexes are normal and symmetric.  Psychiatric:        Behavior: Behavior normal.        Thought Content: Thought content normal.        Judgment: Judgment normal.    Decreased hearind Lesion on nose   Lab Results  Component Value Date   WBC 7.4 03/05/2019   HGB 13.0 03/05/2019   HCT 38.7 (L) 03/05/2019   PLT 211.0 03/05/2019   GLUCOSE 101 (H) 07/01/2019   CHOL 140 04/22/2017   TRIG 113.0 04/22/2017   HDL 48.50 04/22/2017   LDLCALC 69 04/22/2017   ALT 38 07/01/2019   AST 43 (H) 07/01/2019   NA 137 07/01/2019   K 4.3 07/01/2019   CL 103 07/01/2019   CREATININE 1.22 07/01/2019   BUN 35 (H) 07/01/2019   CO2 27 07/01/2019   TSH 5.18 (H) 03/05/2019   PSA 0.87 10/20/2014   INR 3.7 (A) 09/18/2019   HGBA1C 5.7 07/01/2019    No results found.  Assessment & Plan:   There are no diagnoses linked to this encounter.   No orders of the defined types were placed in this encounter.    Follow-up: No follow-ups on file.  Walker Kehr, MD

## 2019-10-01 NOTE — Assessment & Plan Note (Signed)
ASA, Lipitor 

## 2019-10-01 NOTE — Assessment & Plan Note (Signed)
Worse ENT ref 

## 2019-10-09 ENCOUNTER — Other Ambulatory Visit: Payer: Self-pay

## 2019-10-09 ENCOUNTER — Other Ambulatory Visit: Payer: Self-pay | Admitting: *Deleted

## 2019-10-09 ENCOUNTER — Ambulatory Visit: Payer: Medicare Other

## 2019-10-09 DIAGNOSIS — Z7901 Long term (current) use of anticoagulants: Secondary | ICD-10-CM

## 2019-10-09 DIAGNOSIS — Z5181 Encounter for therapeutic drug level monitoring: Secondary | ICD-10-CM | POA: Diagnosis not present

## 2019-10-09 DIAGNOSIS — I059 Rheumatic mitral valve disease, unspecified: Secondary | ICD-10-CM | POA: Diagnosis not present

## 2019-10-09 DIAGNOSIS — Z9889 Other specified postprocedural states: Secondary | ICD-10-CM

## 2019-10-09 DIAGNOSIS — I6350 Cerebral infarction due to unspecified occlusion or stenosis of unspecified cerebral artery: Secondary | ICD-10-CM | POA: Diagnosis not present

## 2019-10-09 DIAGNOSIS — I4891 Unspecified atrial fibrillation: Secondary | ICD-10-CM

## 2019-10-09 LAB — POCT INR: INR: 2.8 (ref 2.0–3.0)

## 2019-10-09 MED ORDER — WARFARIN SODIUM 5 MG PO TABS: ORAL_TABLET | ORAL | 0 refills | Status: DC

## 2019-10-09 NOTE — Patient Instructions (Signed)
Take extra 0.5 tablet today and then continue taking taking dosage 5mg  daily. Recheck in 3 weeks. [facility] [phone]

## 2019-10-14 ENCOUNTER — Other Ambulatory Visit: Payer: Self-pay | Admitting: Internal Medicine

## 2019-10-30 ENCOUNTER — Ambulatory Visit: Payer: Medicare Other | Admitting: Pharmacist

## 2019-10-30 ENCOUNTER — Other Ambulatory Visit: Payer: Self-pay

## 2019-10-30 DIAGNOSIS — Z7901 Long term (current) use of anticoagulants: Secondary | ICD-10-CM | POA: Diagnosis not present

## 2019-10-30 DIAGNOSIS — Z9889 Other specified postprocedural states: Secondary | ICD-10-CM | POA: Diagnosis not present

## 2019-10-30 DIAGNOSIS — I059 Rheumatic mitral valve disease, unspecified: Secondary | ICD-10-CM

## 2019-10-30 DIAGNOSIS — I6350 Cerebral infarction due to unspecified occlusion or stenosis of unspecified cerebral artery: Secondary | ICD-10-CM

## 2019-10-30 DIAGNOSIS — I4891 Unspecified atrial fibrillation: Secondary | ICD-10-CM

## 2019-10-30 LAB — POCT INR: INR: 3.7 — AB (ref 2.0–3.0)

## 2019-10-30 NOTE — Patient Instructions (Signed)
Description   Take 2.5mg  tomorrow since you have taken your dose today, then continue taking taking dosage 5mg  daily. Recheck in 4 weeks per pt request. [facility] [phone]

## 2019-11-18 DIAGNOSIS — H40053 Ocular hypertension, bilateral: Secondary | ICD-10-CM | POA: Diagnosis not present

## 2019-11-18 DIAGNOSIS — H35033 Hypertensive retinopathy, bilateral: Secondary | ICD-10-CM | POA: Diagnosis not present

## 2019-11-18 DIAGNOSIS — H26493 Other secondary cataract, bilateral: Secondary | ICD-10-CM | POA: Diagnosis not present

## 2019-11-18 DIAGNOSIS — Z961 Presence of intraocular lens: Secondary | ICD-10-CM | POA: Diagnosis not present

## 2019-11-23 ENCOUNTER — Telehealth: Payer: Self-pay | Admitting: Internal Medicine

## 2019-11-23 NOTE — Telephone Encounter (Signed)
New message:   Pt is calling and states that the instructions on his current prescription is wrong. He states it says take one and a half and says the correct way is one daily. He was supposed to get a 90 day supply of this medication but only got 45 days worth. The medication is furosemide (LASIX) 40 MG tablet. Please advise.

## 2019-11-24 NOTE — Telephone Encounter (Signed)
New message:  Pt is calling back and states that everything has been figured out and it was a mistake on the pharmacies end and they are getting him the correct dosage sent in the mail.

## 2019-11-25 NOTE — Telephone Encounter (Signed)
Noted  

## 2019-11-27 ENCOUNTER — Ambulatory Visit (INDEPENDENT_AMBULATORY_CARE_PROVIDER_SITE_OTHER): Payer: Medicare Other | Admitting: *Deleted

## 2019-11-27 ENCOUNTER — Other Ambulatory Visit: Payer: Self-pay

## 2019-11-27 DIAGNOSIS — Z9889 Other specified postprocedural states: Secondary | ICD-10-CM | POA: Diagnosis not present

## 2019-11-27 DIAGNOSIS — I4891 Unspecified atrial fibrillation: Secondary | ICD-10-CM | POA: Diagnosis not present

## 2019-11-27 DIAGNOSIS — Z7901 Long term (current) use of anticoagulants: Secondary | ICD-10-CM

## 2019-11-27 DIAGNOSIS — I6350 Cerebral infarction due to unspecified occlusion or stenosis of unspecified cerebral artery: Secondary | ICD-10-CM

## 2019-11-27 DIAGNOSIS — I059 Rheumatic mitral valve disease, unspecified: Secondary | ICD-10-CM | POA: Diagnosis not present

## 2019-11-27 DIAGNOSIS — Z5181 Encounter for therapeutic drug level monitoring: Secondary | ICD-10-CM

## 2019-11-27 LAB — POCT INR: INR: 3.7 — AB (ref 2.0–3.0)

## 2019-11-27 NOTE — Patient Instructions (Addendum)
Description   Take 2.5mg  tomorrow since you have taken your dose today, then continue taking taking dosage 5mg  daily. Recheck INR when you sees Hinton Dyer on 9/14. [facility] [phone]

## 2019-12-04 ENCOUNTER — Encounter: Payer: Self-pay | Admitting: Physician Assistant

## 2019-12-04 NOTE — Progress Notes (Signed)
Cardiology Office Note    Date:  12/08/2019   ID:  Anthony Gould, DOB 09-27-1933, MRN [id number]  PCP:  Cassandria Anger, MD  Cardiologist:  Lauree Chandler, MD  Electrophysiologist:  None   Chief Complaint: f/u syncope, atrial fib, mitral valve disease  History of Present Illness:   Anthony Gould is a 86 y.o. male with history of severe mitral regurgitation s/p Medronic Hall MVR in 1986, permanent atrial fibrillation, pulmonary HTN with elevated PASP, HTN, HLD (followed by PCP), venous insufficiency, PVCs, CKD stage III by labs, cirrhotic changes of the liver by imaging (followed by PCP), former ETOH use, DM, prior TIA on warfarin, syncope, NSVT who presents for follow-up.  He has no prior history of coronary disease in his chart and recalls being told he had clean coronaries before his MVR. He had a nuclear stress test in 2014 which was normal. Echo in 2016 showed normal LVF and normal functioning MVR. Interestingly he reports that ever since his original MVR, he has had intermittent episodes of very transient disorientiation that last about 10 seconds. The episodes used to be more frequent (5-10x/year) but then he got put on ASA and they decreased in frequency. He has also been maintained in dypridamole and warfarin by his cardiologists (Dr. Saunders Revel then Dr. Angelena Form). This now occurs maybe twice a year, unprovoked, without any other acute symptoms. He was walking on the treadmill in December 2020 when he felt that funny disorientation feeling followed by syncope (see 04/2019 OV for full details). He saw primary care and orthostatics were negative. 2D echocardiogram 02/2019 shoed EF 55-60%, moderate LVH, normal RV, severe LAE/RAE, repaired/placed MV with trivial MR, moderate TR, moderately elevated pulmonary artery pressure without significant change from prior. Cardiac event monitor showed atrial fib with average HR 80bpm, occasional PVCs, brief episodes of NSVT (vs aberrancy) 3-7  beats without sustained events, and rare episodic bradycardia in the mid 40s, mostly during sleeping hours but one occasion around 5pm. He declined neurology referral/EEG from PCP and also declined referral to EP/discussion of loop. He wanted to simplify his medications earlier this year by stopping diltiazem and felt good with this.  He is seen back for follow-up overall feeling well. He has not had any recent disorientation episodes. He denies any symptoms of chest pain, dyspnea, palpitations, dizziness or syncope. He does report a generalized sense of fatigue which he relates to ever since the pandemic started. He is inquiring about a trial of cutting his bisoprolol dose down further. No bleeding reported.   Covid vaccination: fully vaccinated as of April 2021   Labwork independently reviewed: 06/2019 K 4.3, Cr 1.22, A1C 5.7, AST 43, ALT 38 02/2019 TSH mildly elevated at 5.18 (normal fT4), INR 3.5, Mg 1.9, Hgb 13.0, K 4.0, Cr 1.34 (c/w prior), LFTs wnl 2019 LDL 69 (lipids followed by primary care)   Past Medical History:  Diagnosis Date  . Blood transfusion without reported diagnosis   . CKD (chronic kidney disease), stage III   . Elevated TSH   . History of mitral valve replacement   . HTN (hypertension)   . Hyperlipidemia   . Mitral regurgitation    s/p MVR with Med[employer] Hall MVR 1986  . Normal nuclear stress test 12/25/01   no ischemia  . NSVT (nonsustained ventricular tachycardia) ([place])   . Peripheral edema    venous insufficiency  . Permanent atrial fibrillation ([place])   . Pulmonary hypertension ([place])   . Syncope   . TIA (  transient ischemic attack)    while on coumadin  . Type II or unspecified type diabetes mellitus without mention of complication, not stated as uncontrolled    lifestyle mangement  . Ventricular ectopy    symptomatic    Past Surgical History:  Procedure Laterality Date  . CHOLECYSTECTOMY, LAPAROSCOPIC  2003  . MITRAL VALVE REPLACEMENT     Hall  mechanical valve due to ruptured chordae    Current Medications: Current Meds  Medication Sig  . allopurinol (ZYLOPRIM) 300 MG tablet TAKE 1 TABLET BY MOUTH DAILY AS NEEDED. GENERIC EQUIVALENT FOR ZYLOPRIM  . amLODipine (NORVASC) 5 MG tablet Take 5 mg by mouth daily.  [name] aspirin 81 MG EC tablet Take 81 mg by mouth daily.    [name] atorvastatin (LIPITOR) 20 MG tablet TAKE 1 TABLET BY MOUTH TWO TIMES A WEEK  . celecoxib (CELEBREX) 200 MG capsule Take 200 mg by mouth 2 (two) times daily. As needed - patient reports he does not ever take this although has PRN rx - discussed bleeding precautions  . Cholecalciferol (VITAMIN D3) 50 MCG (2000 UT) capsule Take 1 capsule (2,000 Units total) by mouth daily.  [name] dipyridamole (PERSANTINE) 50 MG tablet Take 1 tablet (50 mg total) by mouth 3 (three) times daily.  . enalapril (VASOTEC) 20 MG tablet TAKE 1 TABLET BY MOUTH DAILY.  . furosemide (LASIX) 40 MG tablet Take 1 tablet (40 mg total) by mouth daily.  [name] latanoprost (XALATAN) 0.005 % ophthalmic solution Place 1 drop into both eyes at bedtime.  . magnesium oxide (MAG-OX) 400 MG tablet Take 1 tablet (400 mg total) by mouth 2 (two) times daily.  . Milk Thistle 1000 MG CAPS Take 1 capsule by mouth daily.  . pantoprazole (PROTONIX) 40 MG tablet TAKE 1 TABLET BY MOUTH DAILY  . warfarin (COUMADIN) 5 MG tablet Take 1 tablet by mouth as directed by the coumadin clinic  . [DISCONTINUED] bisoprolol (ZEBETA) 5 MG tablet Take 1 tablet (5 mg total) by mouth daily.   Allergies:   Cardizem [diltiazem] and Sulfonamide derivatives   Social History   Socioeconomic History  . Marital status: Married    Spouse name: Anthony Gould  . Number of children: 2  . Years of education: Not on file  . Highest education level: Not on file  Occupational History  . 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 Smoking status: Never Smoker  . Smokeless tobacco: Never Used  Substance and Sexual Activity  . Alcohol  use: No    Alcohol/week: 0.0 standard drinks  . Drug use: No  . Sexual activity: Not on file  Other Topics Concern  . Not on file  Social History Narrative   HSG, many management courses   Married '58   2 sons - '61, '62; 1 grandchild   Nonsmoker         Social Determinants of Radio broadcast assistant Strain:   . Difficulty of Paying Living Expenses: Not on file  Food Insecurity:   . Worried About Charity fundraiser in the Last Year: Not on file  . Ran Out of Food in the Last Year: Not on file  Transportation Needs:   . Lack of Transportation (Medical): Not on file  . Lack of Transportation (Non-Medical): Not on file  Physical Activity:   . Days of Exercise per Week: Not on file  . Minutes of Exercise per Session: Not on file  Stress:   .  Feeling of Stress : Not on file  Social Connections:   . Frequency of Communication with Friends and Family: Not on file  . Frequency of Social Gatherings with Friends and Family: Not on file  . Attends Religious Services: Not on file  . Active Member of Clubs or Organizations: Not on file  . Attends Archivist Meetings: Not on file  . Marital Status: Not on file     Family History:  The patient's family history includes Coronary artery disease in his father and another family member; Diabetes in an other family member. There is no history of Prostate cancer or Colon cancer.  ROS:   Please see the history of present illness. All other systems are reviewed and otherwise negative.    EKGs/Labs/Other Studies Reviewed:    Studies reviewed are outlined and summarized above. Reports included below if pertinent.  2D Echo 02/2019  1. Left ventricular ejection fraction, by visual estimation, is 55 to  60%. The left ventricle has normal function. There is moderately increased  left ventricular hypertrophy.  2. Left ventricular diastolic function could not be evaluated.  3. The left ventricle has no regional wall motion  abnormalities.  4. Global right ventricle has normal systolic function.The right  ventricular size is normal. No increase in right ventricular wall  thickness.  5. Left atrial size was severely dilated.  6. Right atrial size was severely dilated.  7. The mitral valve has been repaired/replaced. Trivial mitral valve  regurgitation.  8. The tricuspid valve is normal in structure. Tricuspid valve  regurgitation moderate.  9. The aortic valve is normal in structure. Aortic valve regurgitation is  not visualized. No evidence of aortic valve sclerosis or stenosis.  10. The pulmonic valve was grossly normal. Pulmonic valve regurgitation is  trivial.  11. Moderately elevated pulmonary artery systolic pressure.  12. The atrial septum is grossly normal.     EKG:  EKG is ordered today, personally reviewed, demonstrating atrial fibrillation 71bpm, NSIVCD, NSST changes, similar to prior  Recent Labs: 03/05/2019: Hemoglobin 13.0; Platelets 211.0; TSH 5.18 05/08/2019: Magnesium 1.9 07/01/2019: ALT 38; BUN 35; Creatinine, Ser 1.22; Potassium 4.3; Sodium 137  Recent Lipid Panel    Component Value Date/Time   CHOL 140 04/22/2017 0905   TRIG 113.0 04/22/2017 0905   HDL 48.50 04/22/2017 0905   CHOLHDL 3 04/22/2017 0905   VLDL 22.6 04/22/2017 0905   LDLCALC 69 04/22/2017 0905    PHYSICAL EXAM:    VS:  BP 120/60   Pulse 71   Ht 5\' 10"  (1.778 m)   Wt 176 lb (79.8 kg)   SpO2 98%   BMI 25.25 kg/m   BMI: Body mass index is 25.25 kg/m.  GEN: Well nourished, well developed WM, in no acute distress HEENT: normocephalic, atraumatic Neck: no JVD, carotid bruits, or masses Cardiac: irregularly irregular, rate controlled,; mechanical S1 S2, no murmurs, rubs, or gallops, no edema  Respiratory:  clear to auscultation bilaterally, normal work of breathing GI: soft, nontender, nondistended, + BS MS: no deformity or atrophy Skin: warm and dry, no rash, +small scaly skin lesion upper nose which  patient is having evaluated by dermatologist, will be using a cream before considering removal per his report Neuro:  Alert and Oriented x 3, Strength and sensation are intact, follows commands Psych: euthymic mood, full affect  Wt Readings from Last 3 Encounters:  12/08/19 176 lb (79.8 kg)  10/01/19 174 lb 9.6 oz (79.2 kg)  07/01/19 173 lb (78.5 kg)  ASSESSMENT & PLAN:   1. History of syncope - prior bradycardia, PVCs, NSVT seen on cardiac monitoring. He declined EP referral/loop at that time and has not had any clinical recurrence. Update Mg level today. Otherwise lytes OK in 06/2019. Continue to monitor. 2. Permanent atrial fibrillation - rate controlled. He is reporting generalized fatigue and requests to try cutting his bisoprolol down. He initially requested to stop completely but given his h/o afib and NSVT I believe it is beneficial to have some beta blocker on board especially in absence of other AVN blocking agents. We will try cutting down instead to 2.5mg  daily. We will update his CBC today to ensure blood count is stable. I also encouraged him to follow up with his PCP for his fatigue as he also previously had elevated TSH with normal FT4 - it is feasible that subclinical hypothyroidism could be contributing to fatigue. He does not wish to recheck this today but plans to follow up with Dr. Alain Marion as planned in January. Continue anticoagulation. 3. Severe mitral regurgitation s/p MVR - continue Coumadin. He has also been maintained on ASA/dipyridamole by his primary cardiologists with recommendation to continue indefinitely. Will update CBC today. He sees Coumadin clinic today for INR check. SBE ppx reviewed. He reports this is prescribed by his primary care provider when needed and declines rx from [place] today. 4. Pulmonary HTN - no recent progression in dyspnea or chest pain. Normal O2 sat. Continue to follow clinically. If he does not show improvement in fatigue with reduction in beta  blocker, sleep study could be considered although based on our conversation he historically prefers a stepwise conservative approach. 5. Essential HTN - follow BP with med change. He'll call for any blood pressures encroaching 262 systolic and above.  Disposition: F/u with Dr. Angelena Form in 6 months.  Medication Adjustments/Labs and Tests Ordered: Current medicines are reviewed at length with the patient today.  Concerns regarding medicines are outlined above. Medication changes, Labs and Tests ordered today are summarized above and listed in the Patient Instructions accessible in Encounters.   Signed, Charlie Pitter, [name]  12/08/2019 10:21 AM    [address], [place], [place]  [address] Phone: [phone]; Fax: [phone]

## 2019-12-07 DIAGNOSIS — C44301 Unspecified malignant neoplasm of skin of nose: Secondary | ICD-10-CM | POA: Diagnosis not present

## 2019-12-08 ENCOUNTER — Ambulatory Visit: Payer: Medicare Other | Admitting: Physician Assistant

## 2019-12-08 ENCOUNTER — Encounter: Payer: Self-pay | Admitting: Physician Assistant

## 2019-12-08 ENCOUNTER — Ambulatory Visit (INDEPENDENT_AMBULATORY_CARE_PROVIDER_SITE_OTHER): Payer: Medicare Other | Admitting: *Deleted

## 2019-12-08 ENCOUNTER — Other Ambulatory Visit: Payer: Self-pay

## 2019-12-08 VITALS — BP 120/60 | HR 71 | Ht 70.0 in | Wt 176.0 lb

## 2019-12-08 DIAGNOSIS — I059 Rheumatic mitral valve disease, unspecified: Secondary | ICD-10-CM | POA: Diagnosis not present

## 2019-12-08 DIAGNOSIS — Z7901 Long term (current) use of anticoagulants: Secondary | ICD-10-CM

## 2019-12-08 DIAGNOSIS — Z952 Presence of prosthetic heart valve: Secondary | ICD-10-CM | POA: Diagnosis not present

## 2019-12-08 DIAGNOSIS — I4821 Permanent atrial fibrillation: Secondary | ICD-10-CM

## 2019-12-08 DIAGNOSIS — Z87898 Personal history of other specified conditions: Secondary | ICD-10-CM | POA: Diagnosis not present

## 2019-12-08 DIAGNOSIS — I272 Pulmonary hypertension, unspecified: Secondary | ICD-10-CM | POA: Diagnosis not present

## 2019-12-08 DIAGNOSIS — I472 Ventricular tachycardia: Secondary | ICD-10-CM | POA: Diagnosis not present

## 2019-12-08 DIAGNOSIS — I6350 Cerebral infarction due to unspecified occlusion or stenosis of unspecified cerebral artery: Secondary | ICD-10-CM | POA: Diagnosis not present

## 2019-12-08 DIAGNOSIS — I4729 Other ventricular tachycardia: Secondary | ICD-10-CM

## 2019-12-08 DIAGNOSIS — Z9889 Other specified postprocedural states: Secondary | ICD-10-CM

## 2019-12-08 DIAGNOSIS — I4891 Unspecified atrial fibrillation: Secondary | ICD-10-CM | POA: Diagnosis not present

## 2019-12-08 DIAGNOSIS — I10 Essential (primary) hypertension: Secondary | ICD-10-CM

## 2019-12-08 LAB — CBC
Hematocrit: 38.1 % (ref 37.5–51.0)
Hemoglobin: 12.9 g/dL — ABNORMAL LOW (ref 13.0–17.7)
MCH: 33 pg (ref 26.6–33.0)
MCHC: 33.9 g/dL (ref 31.5–35.7)
MCV: 97 fL (ref 79–97)
Platelets: 205 10*3/uL (ref 150–450)
RBC: 3.91 x10E6/uL — ABNORMAL LOW (ref 4.14–5.80)
RDW: 13.5 % (ref 11.6–15.4)
WBC: 6.8 10*3/uL (ref 3.4–10.8)

## 2019-12-08 LAB — MAGNESIUM: Magnesium: 2 mg/dL (ref 1.6–2.3)

## 2019-12-08 LAB — POCT INR: INR: 3.4 — AB (ref 2.0–3.0)

## 2019-12-08 MED ORDER — BISOPROLOL FUMARATE 5 MG PO TABS: 2.5000 mg | ORAL_TABLET | Freq: Every day | ORAL | 3 refills | Status: DC

## 2019-12-08 NOTE — Patient Instructions (Signed)
Description   Continue taking Warfarin 5mg  daily. Recheck INR in 4 weeks. Coumadin Clinic [phone]

## 2019-12-08 NOTE — Patient Instructions (Addendum)
Medication Instructions:  Your physician has recommended you make the following change in your medication:  1.  REDUCE the Bisoprolol to 5 mg taking 1/2 tablet daily    *If you need a refill on your cardiac medications before your next appointment, please call your pharmacy*   Lab Work: TODAY:  MAG & CBC  If you have labs (blood work) drawn today and your tests are completely normal, you will receive your results only by: [name] MyChart Message (if you have MyChart) OR . A paper copy in the mail If you have any lab test that is abnormal or we need to change your treatment, we will call you to review the results.   Testing/Procedures: None ordered   Follow-Up: At [facility], you and your health needs are our priority.  As part of our continuing mission to provide you with exceptional heart care, we have created designated Provider Care Teams.  These Care Teams include your primary Cardiologist (physician) and Advanced Practice Providers (APPs -  Physician Assistants and Nurse Practitioners) who all work together to provide you with the care you need, when you need it.  We recommend signing up for the patient portal called "MyChart".  Sign up information is provided on this After Visit Summary.  MyChart is used to connect with patients for Virtual Visits (Telemedicine).  Patients are able to view lab/test results, encounter notes, upcoming appointments, etc.  Non-urgent messages can be sent to your provider as well.   To learn more about what you can do with MyChart, go to [email or web address].    Your next appointment:   12 month(s)  The format for your next appointment:   In Person  Provider:   You may see Lauree Chandler, MD or one of the following Advanced Practice Providers on your designated Care Team:    [name], [name]  Ermalinda Barrios, [name]    Other Instructions    Endocarditis Information  You may be at risk for developing endocarditis since you have an  artificial heart valve or a repaired heart valve. Endocarditis is an infection of the lining of the heart or heart valves. Certain surgical and dental procedures may put you at risk, such as teeth cleaning or other dental procedures or other medical procedures. Notify your doctor or dentist before having any invasive or surgical procedures. You will need to take antibiotics before certain procedures. To prevent endocarditis, maintain good oral health. Seek prompt medical attention for any mouth/gum, skin or urinary tract infections.

## 2019-12-14 DIAGNOSIS — H903 Sensorineural hearing loss, bilateral: Secondary | ICD-10-CM | POA: Diagnosis not present

## 2020-01-05 ENCOUNTER — Other Ambulatory Visit: Payer: Self-pay | Admitting: Cardiovascular Disease

## 2020-01-08 ENCOUNTER — Other Ambulatory Visit: Payer: Self-pay

## 2020-01-08 ENCOUNTER — Ambulatory Visit: Payer: Medicare Other | Admitting: Pharmacist

## 2020-01-08 DIAGNOSIS — Z9889 Other specified postprocedural states: Secondary | ICD-10-CM | POA: Diagnosis not present

## 2020-01-08 DIAGNOSIS — I059 Rheumatic mitral valve disease, unspecified: Secondary | ICD-10-CM | POA: Diagnosis not present

## 2020-01-08 DIAGNOSIS — I6350 Cerebral infarction due to unspecified occlusion or stenosis of unspecified cerebral artery: Secondary | ICD-10-CM | POA: Diagnosis not present

## 2020-01-08 DIAGNOSIS — I4891 Unspecified atrial fibrillation: Secondary | ICD-10-CM

## 2020-01-08 DIAGNOSIS — Z7901 Long term (current) use of anticoagulants: Secondary | ICD-10-CM

## 2020-01-08 DIAGNOSIS — G459 Transient cerebral ischemic attack, unspecified: Secondary | ICD-10-CM

## 2020-01-08 LAB — POCT INR: INR: 3.6 — AB (ref 2.0–3.0)

## 2020-01-08 NOTE — Patient Instructions (Signed)
Eat an extra serving of greens today and continue taking Warfarin 5mg  daily. Recheck INR in 4 weeks. [facility]madin Clinic [phone]

## 2020-01-21 ENCOUNTER — Other Ambulatory Visit: Payer: Self-pay

## 2020-01-21 ENCOUNTER — Ambulatory Visit (INDEPENDENT_AMBULATORY_CARE_PROVIDER_SITE_OTHER): Payer: Medicare Other

## 2020-01-21 DIAGNOSIS — Z23 Encounter for immunization: Secondary | ICD-10-CM | POA: Diagnosis not present

## 2020-01-21 DIAGNOSIS — E785 Hyperlipidemia, unspecified: Secondary | ICD-10-CM | POA: Diagnosis not present

## 2020-01-21 DIAGNOSIS — I493 Ventricular premature depolarization: Secondary | ICD-10-CM | POA: Diagnosis not present

## 2020-01-21 DIAGNOSIS — I10 Essential (primary) hypertension: Secondary | ICD-10-CM | POA: Diagnosis not present

## 2020-01-21 DIAGNOSIS — Z0000 Encounter for general adult medical examination without abnormal findings: Secondary | ICD-10-CM | POA: Diagnosis not present

## 2020-01-22 DIAGNOSIS — C44301 Unspecified malignant neoplasm of skin of nose: Secondary | ICD-10-CM | POA: Diagnosis not present

## 2020-02-05 ENCOUNTER — Ambulatory Visit (INDEPENDENT_AMBULATORY_CARE_PROVIDER_SITE_OTHER): Payer: Medicare Other | Admitting: *Deleted

## 2020-02-05 ENCOUNTER — Other Ambulatory Visit: Payer: Self-pay

## 2020-02-05 DIAGNOSIS — I4891 Unspecified atrial fibrillation: Secondary | ICD-10-CM | POA: Diagnosis not present

## 2020-02-05 DIAGNOSIS — I6350 Cerebral infarction due to unspecified occlusion or stenosis of unspecified cerebral artery: Secondary | ICD-10-CM

## 2020-02-05 DIAGNOSIS — I059 Rheumatic mitral valve disease, unspecified: Secondary | ICD-10-CM

## 2020-02-05 DIAGNOSIS — Z7901 Long term (current) use of anticoagulants: Secondary | ICD-10-CM | POA: Diagnosis not present

## 2020-02-05 DIAGNOSIS — Z9889 Other specified postprocedural states: Secondary | ICD-10-CM | POA: Diagnosis not present

## 2020-02-05 LAB — POCT INR: INR: 3.9 — AB (ref 2.0–3.0)

## 2020-02-05 NOTE — Patient Instructions (Signed)
Description   Tomorrow take 2.5mg  (1/2 tablet) then start taking Warfarin 5mg  daily except 2.5mg  on Saturdays. Recheck INR in 4 weeks. [facility]madin Clinic [phone]

## 2020-03-04 ENCOUNTER — Other Ambulatory Visit: Payer: Self-pay

## 2020-03-04 ENCOUNTER — Ambulatory Visit: Payer: Medicare Other | Admitting: *Deleted

## 2020-03-04 DIAGNOSIS — Z5181 Encounter for therapeutic drug level monitoring: Secondary | ICD-10-CM | POA: Diagnosis not present

## 2020-03-04 DIAGNOSIS — I6350 Cerebral infarction due to unspecified occlusion or stenosis of unspecified cerebral artery: Secondary | ICD-10-CM | POA: Diagnosis not present

## 2020-03-04 DIAGNOSIS — Z9889 Other specified postprocedural states: Secondary | ICD-10-CM | POA: Diagnosis not present

## 2020-03-04 DIAGNOSIS — I059 Rheumatic mitral valve disease, unspecified: Secondary | ICD-10-CM | POA: Diagnosis not present

## 2020-03-04 DIAGNOSIS — I4891 Unspecified atrial fibrillation: Secondary | ICD-10-CM

## 2020-03-04 DIAGNOSIS — Z7901 Long term (current) use of anticoagulants: Secondary | ICD-10-CM

## 2020-03-04 LAB — POCT INR: INR: 3.4 — AB (ref 2.0–3.0)

## 2020-03-04 NOTE — Patient Instructions (Signed)
Description   Continue taking Warfarin 5mg  daily except 2.5mg  on Saturdays. Recheck INR in 4 weeks. Coumadin Clinic [phone]

## 2020-03-24 DIAGNOSIS — C44301 Unspecified malignant neoplasm of skin of nose: Secondary | ICD-10-CM | POA: Diagnosis not present

## 2020-03-25 ENCOUNTER — Other Ambulatory Visit: Payer: Self-pay | Admitting: Physician Assistant

## 2020-04-01 ENCOUNTER — Other Ambulatory Visit (INDEPENDENT_AMBULATORY_CARE_PROVIDER_SITE_OTHER): Payer: Medicare Other

## 2020-04-01 DIAGNOSIS — I10 Essential (primary) hypertension: Secondary | ICD-10-CM | POA: Diagnosis not present

## 2020-04-01 DIAGNOSIS — E1159 Type 2 diabetes mellitus with other circulatory complications: Secondary | ICD-10-CM

## 2020-04-01 DIAGNOSIS — E785 Hyperlipidemia, unspecified: Secondary | ICD-10-CM

## 2020-04-01 LAB — BASIC METABOLIC PANEL
BUN: 24 mg/dL — ABNORMAL HIGH (ref 6–23)
CO2: 25 mEq/L (ref 19–32)
Calcium: 9.5 mg/dL (ref 8.4–10.5)
Chloride: 105 mEq/L (ref 96–112)
Creatinine, Ser: 1.37 mg/dL (ref 0.40–1.50)
GFR: 47.29 mL/min — ABNORMAL LOW (ref >60.00)
Glucose, Bld: 101 mg/dL — ABNORMAL HIGH (ref 70–99)
Potassium: 4.4 mEq/L (ref 3.5–5.1)
Sodium: 139 mEq/L (ref 135–145)

## 2020-04-01 LAB — CBC WITH DIFFERENTIAL/PLATELET
Basophils Absolute: 0.1 10*3/uL (ref 0.0–0.1)
Basophils Relative: 1.2 % (ref 0.0–3.0)
Eosinophils Absolute: 0.7 10*3/uL (ref 0.0–0.7)
Eosinophils Relative: 11 % — ABNORMAL HIGH (ref 0.0–5.0)
HCT: 37.9 % — ABNORMAL LOW (ref 39.0–52.0)
Hemoglobin: 12.7 g/dL — ABNORMAL LOW (ref 13.0–17.0)
Lymphocytes Relative: 24.5 % (ref 12.0–46.0)
Lymphs Abs: 1.5 10*3/uL (ref 0.7–4.0)
MCHC: 33.5 g/dL (ref 30.0–36.0)
MCV: 97.8 fl (ref 78.0–100.0)
Monocytes Absolute: 0.4 10*3/uL (ref 0.1–1.0)
Monocytes Relative: 7.1 % (ref 3.0–12.0)
Neutro Abs: 3.5 10*3/uL (ref 1.4–7.7)
Neutrophils Relative %: 56.2 % (ref 43.0–77.0)
Platelets: 208 10*3/uL (ref 150.0–400.0)
RBC: 3.87 Mil/uL — ABNORMAL LOW (ref 4.22–5.81)
RDW: 14.6 % (ref 11.5–15.5)
WBC: 6.3 10*3/uL (ref 4.0–10.5)

## 2020-04-01 LAB — URINALYSIS
Bilirubin Urine: NEGATIVE
Ketones, ur: NEGATIVE
Leukocytes,Ua: NEGATIVE
Nitrite: NEGATIVE
Specific Gravity, Urine: 1.02 (ref 1.000–1.030)
Total Protein, Urine: NEGATIVE
Urine Glucose: NEGATIVE
Urobilinogen, UA: 0.2 (ref 0.0–1.0)
pH: 5.5 (ref 5.0–8.0)

## 2020-04-01 LAB — HEPATIC FUNCTION PANEL
ALT: 14 U/L (ref 0–53)
AST: 25 U/L (ref 0–37)
Albumin: 4.5 g/dL (ref 3.5–5.2)
Alkaline Phosphatase: 72 U/L (ref 39–117)
Bilirubin, Direct: 0.2 mg/dL (ref 0.0–0.3)
Total Bilirubin: 0.8 mg/dL (ref 0.2–1.2)
Total Protein: 7.2 g/dL (ref 6.0–8.3)

## 2020-04-01 LAB — LIPID PANEL
Cholesterol: 140 mg/dL (ref 0–200)
HDL: 58.8 mg/dL (ref >39.00)
LDL Cholesterol: 62 mg/dL (ref 0–99)
NonHDL: 81.55
Total CHOL/HDL Ratio: 2
Triglycerides: 96 mg/dL (ref 0.0–149.0)
VLDL: 19.2 mg/dL (ref 0.0–40.0)

## 2020-04-01 LAB — TSH: TSH: 10.33 u[IU]/mL — ABNORMAL HIGH (ref 0.35–4.50)

## 2020-04-01 NOTE — Addendum Note (Signed)
Addended by: Trenda Moots on: 2/0/3559 08:08 AM   Modules accepted: Orders

## 2020-04-04 ENCOUNTER — Ambulatory Visit: Payer: Medicare Other | Admitting: Internal Medicine

## 2020-04-08 ENCOUNTER — Ambulatory Visit: Payer: Medicare Other

## 2020-04-08 ENCOUNTER — Encounter: Payer: Self-pay | Admitting: Internal Medicine

## 2020-04-08 ENCOUNTER — Other Ambulatory Visit: Payer: Self-pay

## 2020-04-08 ENCOUNTER — Ambulatory Visit (INDEPENDENT_AMBULATORY_CARE_PROVIDER_SITE_OTHER): Payer: Medicare Other | Admitting: Internal Medicine

## 2020-04-08 DIAGNOSIS — E559 Vitamin D deficiency, unspecified: Secondary | ICD-10-CM

## 2020-04-08 DIAGNOSIS — Z7901 Long term (current) use of anticoagulants: Secondary | ICD-10-CM | POA: Diagnosis not present

## 2020-04-08 DIAGNOSIS — G459 Transient cerebral ischemic attack, unspecified: Secondary | ICD-10-CM

## 2020-04-08 DIAGNOSIS — I4891 Unspecified atrial fibrillation: Secondary | ICD-10-CM | POA: Diagnosis not present

## 2020-04-08 DIAGNOSIS — I6350 Cerebral infarction due to unspecified occlusion or stenosis of unspecified cerebral artery: Secondary | ICD-10-CM | POA: Diagnosis not present

## 2020-04-08 DIAGNOSIS — E1159 Type 2 diabetes mellitus with other circulatory complications: Secondary | ICD-10-CM

## 2020-04-08 DIAGNOSIS — K7460 Unspecified cirrhosis of liver: Secondary | ICD-10-CM

## 2020-04-08 DIAGNOSIS — I480 Paroxysmal atrial fibrillation: Secondary | ICD-10-CM

## 2020-04-08 DIAGNOSIS — I059 Rheumatic mitral valve disease, unspecified: Secondary | ICD-10-CM | POA: Diagnosis not present

## 2020-04-08 DIAGNOSIS — Z9889 Other specified postprocedural states: Secondary | ICD-10-CM | POA: Diagnosis not present

## 2020-04-08 DIAGNOSIS — N183 Chronic kidney disease, stage 3 unspecified: Secondary | ICD-10-CM | POA: Insufficient documentation

## 2020-04-08 LAB — POCT INR: INR: 2.8 (ref 2.0–3.0)

## 2020-04-08 NOTE — Addendum Note (Signed)
Addended by: Cassandria Anger on: 04/08/2020 08:56 AM   Modules accepted: Orders

## 2020-04-08 NOTE — Assessment & Plan Note (Signed)
A1c test. 

## 2020-04-08 NOTE — Progress Notes (Addendum)
Subjective:  Patient ID: Anthony Gould, male    DOB: 08/25/1933  Age: 87 y.o. MRN: [id number]  CC: Follow-up   HPI Anthony Gould presents for nose skin cancer - treated w/Fluorouracil cream  F/u HTN, A fib, DM f/u, NASH vs alcoholic cirrhosis  Outpatient Medications Prior to Visit  Medication Sig Dispense Refill  . allopurinol (ZYLOPRIM) 300 MG tablet TAKE 1 TABLET BY MOUTH DAILY AS NEEDED. GENERIC EQUIVALENT FOR ZYLOPRIM 90 tablet 3  . amLODipine (NORVASC) 5 MG tablet TAKE 1/2 TABLET BY MOUTH EVERY DAY FOR 3 DAYS,IF BLOOD PRESSURE IS STILL HIGHER THAN 130 THEN INCREASE TO 1 TABLET BY MOUTH DAILY 90 tablet 3  . aspirin 81 MG EC tablet Take 81 mg by mouth daily.    [name] atorvastatin (LIPITOR) 20 MG tablet TAKE 1 TABLET BY MOUTH TWO TIMES A WEEK 26 tablet 1  . bisoprolol (ZEBETA) 5 MG tablet Take 0.5 tablets (2.5 mg total) by mouth daily. 45 tablet 3  . celecoxib (CELEBREX) 200 MG capsule Take 200 mg by mouth 2 (two) times daily. As needed    . Cholecalciferol (VITAMIN D3) 50 MCG (2000 UT) capsule Take 1 capsule (2,000 Units total) by mouth daily. 100 capsule 3  . dipyridamole (PERSANTINE) 50 MG tablet Take 1 tablet (50 mg total) by mouth 3 (three) times daily. 270 tablet 3  . enalapril (VASOTEC) 20 MG tablet TAKE 1 TABLET BY MOUTH DAILY. 90 tablet 3  . furosemide (LASIX) 40 MG tablet Take 1 tablet (40 mg total) by mouth daily. 90 tablet 3  . latanoprost (XALATAN) 0.005 % ophthalmic solution Place 1 drop into both eyes at bedtime.    . magnesium oxide (MAG-OX) 400 MG tablet Take 1 tablet (400 mg total) by mouth 2 (two) times daily. 60 tablet 11  . Milk Thistle 1000 MG CAPS Take 1 capsule by mouth daily.    . pantoprazole (PROTONIX) 40 MG tablet TAKE 1 TABLET BY MOUTH DAILY 90 tablet 3  . warfarin (COUMADIN) 5 MG tablet TAKE 1 TABLET BY MOUTH AS DIRECTED BY THE COUMADIN CLINIC 100 tablet 1   No facility-administered medications prior to visit.    ROS: Review of Systems   Constitutional: Negative for appetite change, fatigue and unexpected weight change.  HENT: Negative for congestion, nosebleeds, sneezing, sore throat and trouble swallowing.   Eyes: Negative for itching and visual disturbance.  Respiratory: Negative for cough.   Cardiovascular: Negative for chest pain, palpitations and leg swelling.  Gastrointestinal: Negative for abdominal distention, blood in stool, diarrhea and nausea.  Genitourinary: Negative for frequency and hematuria.  Musculoskeletal: Negative for back pain, gait problem, joint swelling and neck pain.  Skin: Negative for rash.  Neurological: Negative for dizziness, tremors, speech difficulty and weakness.  Psychiatric/Behavioral: Negative for agitation, dysphoric mood and sleep disturbance. The patient is not nervous/anxious.     Objective:  BP (!) 150/80 (BP Location: Left Arm, Patient Position: Sitting, Cuff Size: Normal)   Pulse 90   Temp 98.1 F (36.7 C) (Oral)   Ht 5\' 10"  (1.778 m)   Wt 177 lb (80.3 kg)   SpO2 97%   BMI 25.40 kg/m   BP Readings from Last 3 Encounters:  04/08/20 (!) 150/80  12/08/19 120/60  10/01/19 (!) 160/78    Wt Readings from Last 3 Encounters:  04/08/20 177 lb (80.3 kg)  12/08/19 176 lb (79.8 kg)  10/01/19 174 lb 9.6 oz (79.2 kg)    Physical Exam Constitutional:  General: He is not in acute distress.    Appearance: He is well-developed.     Comments: NAD  HENT:     Mouth/Throat:     Mouth: Oropharynx is clear and moist.  Eyes:     Conjunctiva/sclera: Conjunctivae normal.     Pupils: Pupils are equal, round, and reactive to light.  Neck:     Thyroid: No thyromegaly.     Vascular: No JVD.  Cardiovascular:     Rate and Rhythm: Normal rate. Rhythm irregular.     Pulses: Intact distal pulses.     Heart sounds: Normal heart sounds. No murmur heard. No friction rub. No gallop.   Pulmonary:     Effort: Pulmonary effort is normal. No respiratory distress.     Breath sounds:  Normal breath sounds. No wheezing or rales.  Chest:     Chest wall: No tenderness.  Abdominal:     General: Bowel sounds are normal. There is no distension.     Palpations: Abdomen is soft. There is no mass.     Tenderness: There is no abdominal tenderness. There is no guarding or rebound.  Musculoskeletal:        General: No tenderness or edema. Normal range of motion.     Cervical back: Normal range of motion.  Lymphadenopathy:     Cervical: No cervical adenopathy.  Skin:    General: Skin is warm and dry.     Findings: No rash.  Neurological:     Mental Status: He is alert and oriented to person, place, and time.     Cranial Nerves: No cranial nerve deficit.     Motor: No abnormal muscle tone.     Coordination: He displays a negative Romberg sign. Coordination normal.     Gait: Gait normal.     Deep Tendon Reflexes: Reflexes are normal and symmetric.  Psychiatric:        Mood and Affect: Mood and affect normal.        Behavior: Behavior normal.        Thought Content: Thought content normal.        Judgment: Judgment normal.     Lab Results  Component Value Date   WBC 6.3 04/01/2020   HGB 12.7 (L) 04/01/2020   HCT 37.9 (L) 04/01/2020   PLT 208.0 04/01/2020   GLUCOSE 101 (H) 04/01/2020   CHOL 140 04/01/2020   TRIG 96.0 04/01/2020   HDL 58.80 04/01/2020   LDLCALC 62 04/01/2020   ALT 14 04/01/2020   AST 25 04/01/2020   NA 139 04/01/2020   K 4.4 04/01/2020   CL 105 04/01/2020   CREATININE 1.37 04/01/2020   BUN 24 (H) 04/01/2020   CO2 25 04/01/2020   TSH 10.33 (H) 04/01/2020   PSA 0.87 10/20/2014   INR 3.4 (A) 03/04/2020   HGBA1C 5.7 07/01/2019    No results found.  Assessment & Plan:    Walker Kehr, MD

## 2020-04-08 NOTE — Assessment & Plan Note (Signed)
On Vit D Vit D test

## 2020-04-08 NOTE — Assessment & Plan Note (Signed)
NASH vs alcoholic Liver US

## 2020-04-08 NOTE — Assessment & Plan Note (Signed)
Hydrate well Abd [place]

## 2020-04-08 NOTE — Assessment & Plan Note (Signed)
On Coumadin, ASA 81

## 2020-04-08 NOTE — Patient Instructions (Signed)
Description   Start taking Warfarin 5mg  daily. Recheck INR in 4 weeks. [facility] Clinic [phone]

## 2020-04-14 ENCOUNTER — Other Ambulatory Visit: Payer: Self-pay | Admitting: Physician Assistant

## 2020-04-14 ENCOUNTER — Other Ambulatory Visit: Payer: Self-pay | Admitting: *Deleted

## 2020-04-14 MED ORDER — ALLOPURINOL 300 MG PO TABS: ORAL_TABLET | ORAL | 3 refills | Status: DC

## 2020-04-14 MED ORDER — ATORVASTATIN CALCIUM 20 MG PO TABS: ORAL_TABLET | ORAL | 3 refills | Status: DC

## 2020-04-18 ENCOUNTER — Telehealth: Payer: Self-pay | Admitting: Internal Medicine

## 2020-04-18 DIAGNOSIS — K7460 Unspecified cirrhosis of liver: Secondary | ICD-10-CM

## 2020-04-18 NOTE — Telephone Encounter (Signed)
Patient called and has a few questions about some testing, he said that he doesn't remember the name of the test and is requesting a call back. He can be reached at [phone]

## 2020-04-18 NOTE — Telephone Encounter (Signed)
Pt states that he declined the u/s when they called to schedule b/c he thought he was getting an MRI & that's not what was discussed at last OV.  Pt states he would like the referral again for the abdominal u/s since he now knows what to expect.

## 2020-04-19 DIAGNOSIS — I10 Essential (primary) hypertension: Secondary | ICD-10-CM | POA: Diagnosis not present

## 2020-04-19 NOTE — Addendum Note (Signed)
Addended by: Cassandria Anger on: 04/19/2020 07:27 AM   Modules accepted: Orders

## 2020-04-19 NOTE — Telephone Encounter (Signed)
Okay.  Ultrasound ordered.  Thanks

## 2020-04-26 ENCOUNTER — Ambulatory Visit
Admission: RE | Admit: 2020-04-26 | Discharge: 2020-04-26 | Disposition: A | Discharge status: Home / Self Care | Payer: Medicare Other | Source: Ambulatory Visit | Attending: Internal Medicine | Admitting: Internal Medicine

## 2020-04-26 DIAGNOSIS — K7460 Unspecified cirrhosis of liver: Secondary | ICD-10-CM | POA: Diagnosis not present

## 2020-05-06 ENCOUNTER — Other Ambulatory Visit: Payer: Self-pay

## 2020-05-06 ENCOUNTER — Ambulatory Visit: Payer: Medicare Other

## 2020-05-06 DIAGNOSIS — Z9889 Other specified postprocedural states: Secondary | ICD-10-CM

## 2020-05-06 DIAGNOSIS — I6350 Cerebral infarction due to unspecified occlusion or stenosis of unspecified cerebral artery: Secondary | ICD-10-CM | POA: Diagnosis not present

## 2020-05-06 DIAGNOSIS — G459 Transient cerebral ischemic attack, unspecified: Secondary | ICD-10-CM

## 2020-05-06 DIAGNOSIS — Z7901 Long term (current) use of anticoagulants: Secondary | ICD-10-CM

## 2020-05-06 DIAGNOSIS — I059 Rheumatic mitral valve disease, unspecified: Secondary | ICD-10-CM | POA: Diagnosis not present

## 2020-05-06 DIAGNOSIS — I4891 Unspecified atrial fibrillation: Secondary | ICD-10-CM | POA: Diagnosis not present

## 2020-05-06 LAB — POCT INR: INR: 2.8 (ref 2.0–3.0)

## 2020-05-06 NOTE — Patient Instructions (Signed)
Description   Start taking Warfarin 5mg  daily except 7.5mg  on Fridays.  Recheck INR in 4 weeks. [facility] [phone]

## 2020-05-23 DIAGNOSIS — H40053 Ocular hypertension, bilateral: Secondary | ICD-10-CM | POA: Diagnosis not present

## 2020-05-23 DIAGNOSIS — H3562 Retinal hemorrhage, left eye: Secondary | ICD-10-CM | POA: Diagnosis not present

## 2020-05-23 DIAGNOSIS — H53432 Sector or arcuate defects, left eye: Secondary | ICD-10-CM | POA: Diagnosis not present

## 2020-06-03 ENCOUNTER — Ambulatory Visit: Payer: Medicare Other

## 2020-06-03 ENCOUNTER — Other Ambulatory Visit: Payer: Self-pay

## 2020-06-03 DIAGNOSIS — G459 Transient cerebral ischemic attack, unspecified: Secondary | ICD-10-CM

## 2020-06-03 DIAGNOSIS — I6350 Cerebral infarction due to unspecified occlusion or stenosis of unspecified cerebral artery: Secondary | ICD-10-CM

## 2020-06-03 DIAGNOSIS — Z9889 Other specified postprocedural states: Secondary | ICD-10-CM | POA: Diagnosis not present

## 2020-06-03 DIAGNOSIS — I059 Rheumatic mitral valve disease, unspecified: Secondary | ICD-10-CM

## 2020-06-03 DIAGNOSIS — I4891 Unspecified atrial fibrillation: Secondary | ICD-10-CM | POA: Diagnosis not present

## 2020-06-03 DIAGNOSIS — Z7901 Long term (current) use of anticoagulants: Secondary | ICD-10-CM

## 2020-06-03 LAB — POCT INR: INR: 4.8 — AB (ref 2.0–3.0)

## 2020-06-03 NOTE — Patient Instructions (Signed)
Description   Skip tomorrow's dosage of Warfarin, then start taking Warfarin 5mg  daily.  Recheck INR in 3 weeks. [facility] [phone]

## 2020-06-24 ENCOUNTER — Other Ambulatory Visit: Payer: Self-pay

## 2020-06-24 ENCOUNTER — Ambulatory Visit (INDEPENDENT_AMBULATORY_CARE_PROVIDER_SITE_OTHER): Payer: Medicare Other

## 2020-06-24 DIAGNOSIS — Z7901 Long term (current) use of anticoagulants: Secondary | ICD-10-CM

## 2020-06-24 DIAGNOSIS — G453 Amaurosis fugax: Secondary | ICD-10-CM | POA: Diagnosis not present

## 2020-06-24 DIAGNOSIS — I4891 Unspecified atrial fibrillation: Secondary | ICD-10-CM

## 2020-06-24 DIAGNOSIS — I6350 Cerebral infarction due to unspecified occlusion or stenosis of unspecified cerebral artery: Secondary | ICD-10-CM | POA: Diagnosis not present

## 2020-06-24 DIAGNOSIS — I059 Rheumatic mitral valve disease, unspecified: Secondary | ICD-10-CM

## 2020-06-24 DIAGNOSIS — Z9889 Other specified postprocedural states: Secondary | ICD-10-CM | POA: Diagnosis not present

## 2020-06-24 LAB — POCT INR: INR: 3.9 — AB (ref 2.0–3.0)

## 2020-06-24 NOTE — Patient Instructions (Signed)
-   have your greens this evening - Since you've already taken your warfarin today, only take a 1/2 tablet tomorrow. - then resume taking Warfarin 5mg  daily.   - Recheck INR in 4 weeks (per pt request) [facility] [phone]

## 2020-06-27 ENCOUNTER — Other Ambulatory Visit: Payer: Self-pay | Admitting: Cardiovascular Disease

## 2020-07-01 ENCOUNTER — Other Ambulatory Visit (INDEPENDENT_AMBULATORY_CARE_PROVIDER_SITE_OTHER): Payer: Medicare Other

## 2020-07-01 DIAGNOSIS — K7460 Unspecified cirrhosis of liver: Secondary | ICD-10-CM

## 2020-07-01 DIAGNOSIS — E559 Vitamin D deficiency, unspecified: Secondary | ICD-10-CM | POA: Diagnosis not present

## 2020-07-01 DIAGNOSIS — I480 Paroxysmal atrial fibrillation: Secondary | ICD-10-CM | POA: Diagnosis not present

## 2020-07-01 DIAGNOSIS — E1159 Type 2 diabetes mellitus with other circulatory complications: Secondary | ICD-10-CM | POA: Diagnosis not present

## 2020-07-01 LAB — COMPREHENSIVE METABOLIC PANEL
ALT: 37 U/L (ref 0–53)
AST: 48 U/L — ABNORMAL HIGH (ref 0–37)
Albumin: 4.3 g/dL (ref 3.5–5.2)
Alkaline Phosphatase: 80 U/L (ref 39–117)
BUN: 24 mg/dL — ABNORMAL HIGH (ref 6–23)
CO2: 28 mEq/L (ref 19–32)
Calcium: 9.2 mg/dL (ref 8.4–10.5)
Chloride: 101 mEq/L (ref 96–112)
Creatinine, Ser: 1.32 mg/dL (ref 0.40–1.50)
GFR: 49.36 mL/min — ABNORMAL LOW (ref >60.00)
Glucose, Bld: 95 mg/dL (ref 70–99)
Potassium: 4.4 mEq/L (ref 3.5–5.1)
Sodium: 136 mEq/L (ref 135–145)
Total Bilirubin: 0.8 mg/dL (ref 0.2–1.2)
Total Protein: 6.7 g/dL (ref 6.0–8.3)

## 2020-07-01 LAB — VITAMIN D 25 HYDROXY (VIT D DEFICIENCY, FRACTURES): VITD: 44.25 ng/mL (ref 30.00–100.00)

## 2020-07-01 LAB — T4, FREE: Free T4: 0.89 ng/dL (ref 0.60–1.60)

## 2020-07-01 LAB — HEMOGLOBIN A1C: Hgb A1c MFr Bld: 5.5 % (ref 4.6–6.5)

## 2020-07-01 LAB — TSH: TSH: 11.11 u[IU]/mL — ABNORMAL HIGH (ref 0.35–4.50)

## 2020-07-02 ENCOUNTER — Other Ambulatory Visit: Payer: Self-pay | Admitting: Internal Medicine

## 2020-07-02 DIAGNOSIS — I4821 Permanent atrial fibrillation: Secondary | ICD-10-CM

## 2020-07-02 DIAGNOSIS — I10 Essential (primary) hypertension: Secondary | ICD-10-CM

## 2020-07-07 ENCOUNTER — Encounter: Payer: Self-pay | Admitting: Internal Medicine

## 2020-07-07 ENCOUNTER — Ambulatory Visit: Payer: Medicare Other | Admitting: Internal Medicine

## 2020-07-07 ENCOUNTER — Other Ambulatory Visit: Payer: Self-pay

## 2020-07-07 DIAGNOSIS — E1159 Type 2 diabetes mellitus with other circulatory complications: Secondary | ICD-10-CM

## 2020-07-07 DIAGNOSIS — K7460 Unspecified cirrhosis of liver: Secondary | ICD-10-CM

## 2020-07-07 DIAGNOSIS — I4821 Permanent atrial fibrillation: Secondary | ICD-10-CM

## 2020-07-07 DIAGNOSIS — R7989 Other specified abnormal findings of blood chemistry: Secondary | ICD-10-CM

## 2020-07-07 DIAGNOSIS — N183 Chronic kidney disease, stage 3 unspecified: Secondary | ICD-10-CM

## 2020-07-07 DIAGNOSIS — I10 Essential (primary) hypertension: Secondary | ICD-10-CM

## 2020-07-07 NOTE — Assessment & Plan Note (Addendum)
Cont w/Bisoprolol, Diltiazem d/c, ASA, Coumadin, Norvasc

## 2020-07-07 NOTE — Assessment & Plan Note (Signed)
Good hydration

## 2020-07-07 NOTE — Patient Instructions (Addendum)
Turmeric Black cherry extract Milk thistle

## 2020-07-07 NOTE — Assessment & Plan Note (Signed)
Monitor LFT

## 2020-07-07 NOTE — Assessment & Plan Note (Signed)
FT4 is nl

## 2020-07-07 NOTE — Progress Notes (Signed)
Subjective:  Patient ID: Anthony Gould, male    DOB: 1933/11/26  Age: 87 y.o. MRN: [id number]  CC: Follow-up (3 month f/u)   HPI TYRIE PORZIO presents for HTN, A fib,liver disease  Outpatient Medications Prior to Visit  Medication Sig Dispense Refill  . allopurinol (ZYLOPRIM) 300 MG tablet TAKE 1 TABLET BY MOUTH DAILY AS NEEDED. GENERIC EQUIVALENT FOR ZYLOPRIM 90 tablet 3  . amLODipine (NORVASC) 5 MG tablet TAKE 1/2 TABLET BY MOUTH EVERY DAY FOR 3 DAYS,IF BLOOD PRESSURE IS STILL HIGHER THAN 130 THEN INCREASE TO 1 TABLET BY MOUTH DAILY 90 tablet 3  . aspirin 81 MG EC tablet Take 81 mg by mouth daily.    [name] atorvastatin (LIPITOR) 20 MG tablet TAKE 1 TABLET BY MOUTH TWO TIMES A WEEK 26 tablet 3  . bisoprolol (ZEBETA) 5 MG tablet Take 0.5 tablets (2.5 mg total) by mouth daily. 45 tablet 3  . celecoxib (CELEBREX) 200 MG capsule Take 200 mg by mouth 2 (two) times daily. As needed    . Cholecalciferol (VITAMIN D3) 50 MCG (2000 UT) capsule Take 1 capsule (2,000 Units total) by mouth daily. 100 capsule 3  . dipyridamole (PERSANTINE) 50 MG tablet Take 1 tablet (50 mg total) by mouth 3 (three) times daily. 270 tablet 3  . enalapril (VASOTEC) 20 MG tablet TAKE 1 TABLET BY MOUTH DAILY. 90 tablet 3  . furosemide (LASIX) 40 MG tablet TAKE 1 TABLET BY MOUTH DAILY GENERIC EQUIVALENT FOR LASIX 90 tablet 3  . latanoprost (XALATAN) 0.005 % ophthalmic solution Place 1 drop into both eyes at bedtime.    . magnesium oxide (MAG-OX) 400 MG tablet TAKE 1 TABLET(400 MG) BY MOUTH TWICE DAILY 60 tablet 7  . Milk Thistle 1000 MG CAPS Take 1 capsule by mouth daily.    . pantoprazole (PROTONIX) 40 MG tablet TAKE 1 TABLET BY MOUTH DAILY 90 tablet 3  . warfarin (COUMADIN) 5 MG tablet TAKE 1 TABLET BY MOUTH AS DIRECTED BY THE COUMADIN CLINIC 100 tablet 1   No facility-administered medications prior to visit.    ROS: Review of Systems  Constitutional: Negative for appetite change, fatigue and unexpected weight  change.  HENT: Negative for congestion, nosebleeds, sneezing, sore throat and trouble swallowing.   Eyes: Negative for itching and visual disturbance.  Respiratory: Negative for cough.   Cardiovascular: Negative for chest pain, palpitations and leg swelling.  Gastrointestinal: Negative for abdominal distention, blood in stool, diarrhea and nausea.  Genitourinary: Negative for frequency and hematuria.  Musculoskeletal: Positive for arthralgias. Negative for back pain, gait problem, joint swelling and neck pain.  Skin: Negative for rash.  Neurological: Negative for dizziness, tremors, speech difficulty and weakness.  Psychiatric/Behavioral: Negative for agitation, dysphoric mood, sleep disturbance and suicidal ideas. The patient is not nervous/anxious.     Objective:  BP (!) 142/74 (BP Location: Left Arm)   Pulse 75   Temp 98 F (36.7 C) (Oral)   Ht 5\' 10"  (1.778 m)   Wt 174 lb 6.4 oz (79.1 kg)   SpO2 94%   BMI 25.02 kg/m   BP Readings from Last 3 Encounters:  07/07/20 (!) 142/74  04/08/20 (!) 150/80  12/08/19 120/60    Wt Readings from Last 3 Encounters:  07/07/20 174 lb 6.4 oz (79.1 kg)  04/08/20 177 lb (80.3 kg)  12/08/19 176 lb (79.8 kg)    Physical Exam Constitutional:      General: He is not in acute distress.    Appearance: He  is well-developed.     Comments: NAD  Eyes:     Conjunctiva/sclera: Conjunctivae normal.     Pupils: Pupils are equal, round, and reactive to light.  Neck:     Thyroid: No thyromegaly.     Vascular: No JVD.  Cardiovascular:     Rate and Rhythm: Normal rate and regular rhythm.     Heart sounds: Normal heart sounds. No murmur heard. No friction rub. No gallop.   Pulmonary:     Effort: Pulmonary effort is normal. No respiratory distress.     Breath sounds: Normal breath sounds. No wheezing or rales.  Chest:     Chest wall: No tenderness.  Abdominal:     General: Bowel sounds are normal. There is no distension.     Palpations: Abdomen  is soft. There is no mass.     Tenderness: There is no abdominal tenderness. There is no guarding or rebound.  Musculoskeletal:        General: No tenderness. Normal range of motion.     Cervical back: Normal range of motion.  Lymphadenopathy:     Cervical: No cervical adenopathy.  Skin:    General: Skin is warm and dry.     Findings: No rash.  Neurological:     Mental Status: He is alert and oriented to person, place, and time.     Cranial Nerves: No cranial nerve deficit.     Motor: No abnormal muscle tone.     Coordination: Coordination normal.     Gait: Gait normal.     Deep Tendon Reflexes: Reflexes are normal and symmetric.  Psychiatric:        Behavior: Behavior normal.        Thought Content: Thought content normal.        Judgment: Judgment normal.     Lab Results  Component Value Date   WBC 6.3 04/01/2020   HGB 12.7 (L) 04/01/2020   HCT 37.9 (L) 04/01/2020   PLT 208.0 04/01/2020   GLUCOSE 95 07/01/2020   CHOL 140 04/01/2020   TRIG 96.0 04/01/2020   HDL 58.80 04/01/2020   LDLCALC 62 04/01/2020   ALT 37 07/01/2020   AST 48 (H) 07/01/2020   NA 136 07/01/2020   K 4.4 07/01/2020   CL 101 07/01/2020   CREATININE 1.32 07/01/2020   BUN 24 (H) 07/01/2020   CO2 28 07/01/2020   TSH 11.11 (H) 07/01/2020   PSA 0.87 10/20/2014   INR 3.9 (A) 06/24/2020   HGBA1C 5.5 07/01/2020    US Abdomen Limited RUQ (LIVER/GB)  Result Date: 04/26/2020 CLINICAL DATA:  Cirrhosis EXAM: ULTRASOUND ABDOMEN LIMITED RIGHT UPPER QUADRANT COMPARISON:  12/26/2017 FINDINGS: Gallbladder: Absent Common bile duct: Diameter: 6 mm in proximal diameter Liver: The liver contour is nodular and the hepatic echotexture is mildly, diffusely coarsened in keeping with changes of underlying cirrhosis. No focal intrahepatic mass is identified. There is no intrahepatic biliary ductal dilation. Liver size is within normal limits. Portal vein is patent on color Doppler imaging with normal direction of blood flow  towards the liver. Other: None. IMPRESSION: Parenchymal changes in keeping with cirrhosis. No focal hepatic mass identified. Electronically Signed   By: Fidela Salisbury MD   On: 04/26/2020 14:39    Assessment & Plan:   There are no diagnoses linked to this encounter.   No orders of the defined types were placed in this encounter.    Follow-up: No follow-ups on file.  Walker Kehr, MD

## 2020-07-07 NOTE — Assessment & Plan Note (Signed)
ASA, Lipitor 

## 2020-07-22 ENCOUNTER — Ambulatory Visit: Payer: Medicare Other | Admitting: *Deleted

## 2020-07-22 ENCOUNTER — Other Ambulatory Visit: Payer: Self-pay

## 2020-07-22 DIAGNOSIS — Z9889 Other specified postprocedural states: Secondary | ICD-10-CM | POA: Diagnosis not present

## 2020-07-22 DIAGNOSIS — I4891 Unspecified atrial fibrillation: Secondary | ICD-10-CM

## 2020-07-22 DIAGNOSIS — Z7901 Long term (current) use of anticoagulants: Secondary | ICD-10-CM

## 2020-07-22 DIAGNOSIS — I6350 Cerebral infarction due to unspecified occlusion or stenosis of unspecified cerebral artery: Secondary | ICD-10-CM

## 2020-07-22 DIAGNOSIS — I059 Rheumatic mitral valve disease, unspecified: Secondary | ICD-10-CM

## 2020-07-22 LAB — POCT INR: INR: 4.1 — AB (ref 2.0–3.0)

## 2020-07-22 NOTE — Patient Instructions (Signed)
Description   Do not take any Warfarin tomorrow then start taking Warfarin 5mg  daily except 2.5mg  on Tuesdays. Recheck INR in 3 weeks. [facility] [phone]

## 2020-08-07 ENCOUNTER — Other Ambulatory Visit: Payer: Self-pay | Admitting: Cardiovascular Disease

## 2020-08-07 ENCOUNTER — Other Ambulatory Visit: Payer: Self-pay | Admitting: Internal Medicine

## 2020-08-07 DIAGNOSIS — I10 Essential (primary) hypertension: Secondary | ICD-10-CM

## 2020-08-07 DIAGNOSIS — I4821 Permanent atrial fibrillation: Secondary | ICD-10-CM

## 2020-08-07 DIAGNOSIS — Z952 Presence of prosthetic heart valve: Secondary | ICD-10-CM

## 2020-08-08 ENCOUNTER — Other Ambulatory Visit: Payer: Self-pay

## 2020-08-08 DIAGNOSIS — I4821 Permanent atrial fibrillation: Secondary | ICD-10-CM

## 2020-08-08 DIAGNOSIS — Z952 Presence of prosthetic heart valve: Secondary | ICD-10-CM

## 2020-08-08 DIAGNOSIS — I10 Essential (primary) hypertension: Secondary | ICD-10-CM

## 2020-08-08 MED ORDER — DIPYRIDAMOLE 50 MG PO TABS: 50.0000 mg | ORAL_TABLET | Freq: Three times a day (TID) | ORAL | 1 refills | Status: DC

## 2020-08-10 ENCOUNTER — Other Ambulatory Visit: Payer: Self-pay

## 2020-08-10 ENCOUNTER — Encounter: Payer: Self-pay | Admitting: Internal Medicine

## 2020-08-10 ENCOUNTER — Ambulatory Visit (INDEPENDENT_AMBULATORY_CARE_PROVIDER_SITE_OTHER): Payer: Medicare Other | Admitting: Internal Medicine

## 2020-08-10 ENCOUNTER — Ambulatory Visit (INDEPENDENT_AMBULATORY_CARE_PROVIDER_SITE_OTHER): Payer: Medicare Other

## 2020-08-10 DIAGNOSIS — E559 Vitamin D deficiency, unspecified: Secondary | ICD-10-CM | POA: Diagnosis not present

## 2020-08-10 DIAGNOSIS — M25551 Pain in right hip: Secondary | ICD-10-CM

## 2020-08-10 DIAGNOSIS — I480 Paroxysmal atrial fibrillation: Secondary | ICD-10-CM | POA: Diagnosis not present

## 2020-08-10 MED ORDER — HYDROCODONE-ACETAMINOPHEN 7.5-325 MG PO TABS: 1.0000 | ORAL_TABLET | Freq: Four times a day (QID) | ORAL | 0 refills | Status: DC | PRN

## 2020-08-10 MED ORDER — KETOROLAC TROMETHAMINE 30 MG/ML IJ SOLN
30.0000 mg | Freq: Once | INTRAMUSCULAR | Status: AC
  Administered 2020-08-10: 30 mg via INTRAVENOUS

## 2020-08-10 NOTE — Patient Instructions (Signed)
Go to ER if worse 

## 2020-08-10 NOTE — Progress Notes (Signed)
Subjective:  Patient ID: Anthony Gould, male    DOB: 1934-03-09  Age: 87 y.o. MRN: [id number]  CC: Groin Pain ((R) SIDE. Pt states also have pain in both legs, but today not hurting as bad)   HPI Anthony Gould presents for R knee pain, then L knee pain x1 month. 2 wks ago Anthony Gould developed pain in the  R groin - severe at night, can't sleep  Outpatient Medications Prior to Visit  Medication Sig Dispense Refill  . allopurinol (ZYLOPRIM) 300 MG tablet TAKE 1 TABLET BY MOUTH DAILY AS NEEDED. GENERIC EQUIVALENT FOR ZYLOPRIM 90 tablet 3  . amLODipine (NORVASC) 5 MG tablet TAKE 1/2 TABLET BY MOUTH EVERY DAY FOR 3 DAYS,IF BLOOD PRESSURE IS STILL HIGHER THAN 130 THEN INCREASE TO 1 TABLET BY MOUTH DAILY 90 tablet 3  . aspirin 81 MG EC tablet Take 81 mg by mouth daily.    [name] atorvastatin (LIPITOR) 20 MG tablet TAKE 1 TABLET BY MOUTH TWO TIMES A WEEK 26 tablet 3  . bisoprolol (ZEBETA) 5 MG tablet Take 0.5 tablets (2.5 mg total) by mouth daily. 45 tablet 3  . Cholecalciferol (VITAMIN D3) 50 MCG (2000 UT) capsule Take 1 capsule (2,000 Units total) by mouth daily. 100 capsule 3  . dipyridamole (PERSANTINE) 50 MG tablet Take 1 tablet (50 mg total) by mouth 3 (three) times daily. 270 tablet 1  . enalapril (VASOTEC) 20 MG tablet TAKE 1 TABLET BY MOUTH DAILY. 90 tablet 3  . furosemide (LASIX) 40 MG tablet TAKE 1 TABLET BY MOUTH DAILY GENERIC EQUIVALENT FOR LASIX 90 tablet 3  . latanoprost (XALATAN) 0.005 % ophthalmic solution Place 1 drop into both eyes at bedtime.    . magnesium oxide (MAG-OX) 400 MG tablet TAKE 1 TABLET(400 MG) BY MOUTH TWICE DAILY 60 tablet 7  . Milk Thistle 1000 MG CAPS Take 1 capsule by mouth daily.    . pantoprazole (PROTONIX) 40 MG tablet TAKE 1 TABLET BY MOUTH DAILY 90 tablet 3  . warfarin (COUMADIN) 5 MG tablet TAKE 1 TABLET BY MOUTH AS DIRECTED BY THE COUMADIN CLINIC 100 tablet 1  . celecoxib (CELEBREX) 200 MG capsule Take 200 mg by mouth 2 (two) times daily. As needed (Patient  not taking: Reported on 08/10/2020)     No facility-administered medications prior to visit.    ROS: Review of Systems  Constitutional: Negative for appetite change, fatigue and unexpected weight change.  HENT: Negative for congestion, nosebleeds, sneezing, sore throat and trouble swallowing.   Eyes: Negative for itching and visual disturbance.  Respiratory: Negative for cough.   Cardiovascular: Negative for chest pain, palpitations and leg swelling.  Gastrointestinal: Negative for abdominal distention, blood in stool, diarrhea and nausea.  Genitourinary: Negative for frequency and hematuria.  Musculoskeletal: Positive for arthralgias and gait problem. Negative for back pain, joint swelling and neck pain.  Skin: Negative for rash.  Neurological: Negative for dizziness, tremors, speech difficulty and weakness.  Psychiatric/Behavioral: Negative for agitation, dysphoric mood and sleep disturbance. The patient is not nervous/anxious.     Objective:  BP (!) 158/70 (BP Location: Left Arm)   Pulse 80   Temp 98.2 F (36.8 C) (Oral)   Ht 5\' 10"  (1.778 m)   Wt 176 lb 12.8 oz (80.2 kg)   SpO2 98%   BMI 25.37 kg/m   BP Readings from Last 3 Encounters:  08/10/20 (!) 158/70  07/07/20 (!) 142/74  04/08/20 (!) 150/80    Wt Readings from Last 3 Encounters:  08/10/20 176 lb 12.8 oz (80.2 kg)  07/07/20 174 lb 6.4 oz (79.1 kg)  04/08/20 177 lb (80.3 kg)    Physical Exam Constitutional:      General: He is not in acute distress.    Appearance: He is well-developed.     Comments: NAD  Eyes:     Conjunctiva/sclera: Conjunctivae normal.     Pupils: Pupils are equal, round, and reactive to light.  Neck:     Thyroid: No thyromegaly.     Vascular: No JVD.  Cardiovascular:     Rate and Rhythm: Normal rate and regular rhythm.     Heart sounds: Normal heart sounds. No murmur heard. No friction rub. No gallop.   Pulmonary:     Effort: Pulmonary effort is normal. No respiratory distress.      Breath sounds: Normal breath sounds. No wheezing or rales.  Chest:     Chest wall: No tenderness.  Abdominal:     General: Bowel sounds are normal. There is no distension.     Palpations: Abdomen is soft. There is no mass.     Tenderness: There is no abdominal tenderness. There is no guarding or rebound.  Musculoskeletal:        General: Tenderness present. Normal range of motion.     Cervical back: Normal range of motion.  Lymphadenopathy:     Cervical: No cervical adenopathy.  Skin:    General: Skin is warm and dry.     Findings: No rash.  Neurological:     Mental Status: He is alert and oriented to person, place, and time.     Cranial Nerves: No cranial nerve deficit.     Motor: No abnormal muscle tone.     Coordination: Coordination normal.     Gait: Gait abnormal.     Deep Tendon Reflexes: Reflexes are normal and symmetric.  Psychiatric:        Behavior: Behavior normal.        Thought Content: Thought content normal.        Judgment: Judgment normal.    Very painful R hip R groin is tender No hernia LS NT   Lab Results  Component Value Date   WBC 6.3 04/01/2020   HGB 12.7 (L) 04/01/2020   HCT 37.9 (L) 04/01/2020   PLT 208.0 04/01/2020   GLUCOSE 95 07/01/2020   CHOL 140 04/01/2020   TRIG 96.0 04/01/2020   HDL 58.80 04/01/2020   LDLCALC 62 04/01/2020   ALT 37 07/01/2020   AST 48 (H) 07/01/2020   NA 136 07/01/2020   K 4.4 07/01/2020   CL 101 07/01/2020   CREATININE 1.32 07/01/2020   BUN 24 (H) 07/01/2020   CO2 28 07/01/2020   TSH 11.11 (H) 07/01/2020   PSA 0.87 10/20/2014   INR 4.1 (A) 07/22/2020   HGBA1C 5.5 07/01/2020    US Abdomen Limited RUQ (LIVER/GB)  Result Date: 04/26/2020 CLINICAL DATA:  Cirrhosis EXAM: ULTRASOUND ABDOMEN LIMITED RIGHT UPPER QUADRANT COMPARISON:  12/26/2017 FINDINGS: Gallbladder: Absent Common bile duct: Diameter: 6 mm in proximal diameter Liver: The liver contour is nodular and the hepatic echotexture is mildly, diffusely  coarsened in keeping with changes of underlying cirrhosis. No focal intrahepatic mass is identified. There is no intrahepatic biliary ductal dilation. Liver size is within normal limits. Portal vein is patent on color Doppler imaging with normal direction of blood flow towards the liver. Other: None. IMPRESSION: Parenchymal changes in keeping with cirrhosis. No focal hepatic mass identified. Electronically Signed  By: Fidela Salisbury MD   On: 04/26/2020 14:39    Assessment & Plan:     Follow-up: No follow-ups on file.  Walker Kehr, MD

## 2020-08-10 NOTE — Assessment & Plan Note (Addendum)
Acute, severe R - r/o FHN, OA, fx etc Hip X ray,  Ortho ref this week Norco prn

## 2020-08-11 NOTE — Assessment & Plan Note (Signed)
On Vit D 

## 2020-08-11 NOTE — Assessment & Plan Note (Signed)
On Bisoprolol, Enalapril, Furosemide, Coumadin, Amlodipine, ASA 81

## 2020-08-12 ENCOUNTER — Other Ambulatory Visit: Payer: Self-pay

## 2020-08-12 ENCOUNTER — Ambulatory Visit: Payer: Medicare Other | Admitting: Pharmacist

## 2020-08-12 DIAGNOSIS — I6350 Cerebral infarction due to unspecified occlusion or stenosis of unspecified cerebral artery: Secondary | ICD-10-CM

## 2020-08-12 DIAGNOSIS — Z9889 Other specified postprocedural states: Secondary | ICD-10-CM | POA: Diagnosis not present

## 2020-08-12 DIAGNOSIS — I059 Rheumatic mitral valve disease, unspecified: Secondary | ICD-10-CM | POA: Diagnosis not present

## 2020-08-12 DIAGNOSIS — I4891 Unspecified atrial fibrillation: Secondary | ICD-10-CM

## 2020-08-12 DIAGNOSIS — Z7901 Long term (current) use of anticoagulants: Secondary | ICD-10-CM

## 2020-08-12 LAB — POCT INR: INR: 3.5 — AB (ref 2.0–3.0)

## 2020-08-12 NOTE — Patient Instructions (Signed)
Continue taking Warfarin 5mg  daily except 2.5mg  on Tuesdays. Recheck INR in 4 weeks. [facility] [phone]

## 2020-08-22 DIAGNOSIS — F4321 Adjustment disorder with depressed mood: Secondary | ICD-10-CM | POA: Diagnosis not present

## 2020-08-24 ENCOUNTER — Ambulatory Visit: Payer: Medicare Other | Admitting: Internal Medicine

## 2020-08-29 DIAGNOSIS — R350 Frequency of micturition: Secondary | ICD-10-CM | POA: Diagnosis not present

## 2020-08-29 DIAGNOSIS — E2839 Other primary ovarian failure: Secondary | ICD-10-CM | POA: Diagnosis not present

## 2020-08-29 DIAGNOSIS — F4323 Adjustment disorder with mixed anxiety and depressed mood: Secondary | ICD-10-CM | POA: Diagnosis not present

## 2020-09-01 ENCOUNTER — Other Ambulatory Visit: Payer: Self-pay

## 2020-09-01 ENCOUNTER — Ambulatory Visit (INDEPENDENT_AMBULATORY_CARE_PROVIDER_SITE_OTHER): Payer: Medicare Other | Admitting: Internal Medicine

## 2020-09-01 ENCOUNTER — Encounter: Payer: Self-pay | Admitting: Internal Medicine

## 2020-09-01 DIAGNOSIS — F4321 Adjustment disorder with depressed mood: Secondary | ICD-10-CM | POA: Diagnosis not present

## 2020-09-01 DIAGNOSIS — M25551 Pain in right hip: Secondary | ICD-10-CM | POA: Diagnosis not present

## 2020-09-01 DIAGNOSIS — Z634 Disappearance and death of family member: Secondary | ICD-10-CM

## 2020-09-01 DIAGNOSIS — E785 Hyperlipidemia, unspecified: Secondary | ICD-10-CM | POA: Diagnosis not present

## 2020-09-01 DIAGNOSIS — E1159 Type 2 diabetes mellitus with other circulatory complications: Secondary | ICD-10-CM | POA: Diagnosis not present

## 2020-09-01 NOTE — Assessment & Plan Note (Addendum)
?  etiology. Pain is 90% better R groin/hip pain f/u - better. He was not able to see Ortho due to miscommunication via flip phone Go to see Ortho if pain has come back

## 2020-09-01 NOTE — Assessment & Plan Note (Addendum)
85 yo son died in 6/22 Discussed.  Hospice referral was offered.

## 2020-09-01 NOTE — Progress Notes (Signed)
Subjective:  Patient ID: Anthony Gould, male    DOB: [birth year]  Age: 87 y.o. MRN: [id number]  CC: Follow-up (2 week f/u)   HPI Anthony Gould presents for R groin/hip pain f/u - better. He was not able to see Ortho due to miscommunication via flip phone  Outpatient Medications Prior to Visit  Medication Sig Dispense Refill   allopurinol (ZYLOPRIM) 300 MG tablet TAKE 1 TABLET BY MOUTH DAILY AS NEEDED. GENERIC EQUIVALENT FOR ZYLOPRIM 90 tablet 3   amLODipine (NORVASC) 5 MG tablet TAKE 1/2 TABLET BY MOUTH EVERY DAY FOR 3 DAYS,IF BLOOD PRESSURE IS STILL HIGHER THAN 130 THEN INCREASE TO 1 TABLET BY MOUTH DAILY 90 tablet 3   aspirin 81 MG EC tablet Take 81 mg by mouth daily.     atorvastatin (LIPITOR) 20 MG tablet TAKE 1 TABLET BY MOUTH TWO TIMES A WEEK 26 tablet 3   bisoprolol (ZEBETA) 5 MG tablet Take 0.5 tablets (2.5 mg total) by mouth daily. 45 tablet 3   Cholecalciferol (VITAMIN D3) 50 MCG (2000 UT) capsule Take 1 capsule (2,000 Units total) by mouth daily. 100 capsule 3   dipyridamole (PERSANTINE) 50 MG tablet Take 1 tablet (50 mg total) by mouth 3 (three) times daily. 270 tablet 1   enalapril (VASOTEC) 20 MG tablet TAKE 1 TABLET BY MOUTH DAILY. 90 tablet 3   furosemide (LASIX) 40 MG tablet TAKE 1 TABLET BY MOUTH DAILY GENERIC EQUIVALENT FOR LASIX 90 tablet 3   HYDROcodone-acetaminophen (NORCO) 7.5-325 MG tablet Take 1 tablet by mouth every 6 (six) hours as needed for severe pain. 20 tablet 0   latanoprost (XALATAN) 0.005 % ophthalmic solution Place 1 drop into both eyes at bedtime.     magnesium oxide (MAG-OX) 400 MG tablet TAKE 1 TABLET(400 MG) BY MOUTH TWICE DAILY 60 tablet 7   Milk Thistle 1000 MG CAPS Take 1 capsule by mouth daily.     pantoprazole (PROTONIX) 40 MG tablet TAKE 1 TABLET BY MOUTH DAILY 90 tablet 3   warfarin (COUMADIN) 5 MG tablet TAKE 1 TABLET BY MOUTH AS DIRECTED BY THE COUMADIN CLINIC 100 tablet 1   No facility-administered medications prior to visit.     ROS: Review of Systems  Constitutional:  Negative for appetite change, fatigue and unexpected weight change.  HENT:  Negative for congestion, nosebleeds, sneezing, sore throat and trouble swallowing.   Eyes:  Negative for itching and visual disturbance.  Respiratory:  Negative for cough.   Cardiovascular:  Negative for chest pain, palpitations and leg swelling.  Gastrointestinal:  Negative for abdominal distention, blood in stool, diarrhea and nausea.  Genitourinary:  Negative for frequency and hematuria.  Musculoskeletal:  Positive for gait problem. Negative for back pain, joint swelling and neck pain.  Skin:  Negative for rash.  Neurological:  Negative for dizziness, tremors, speech difficulty and weakness.  Psychiatric/Behavioral:  Positive for dysphoric mood. Negative for agitation, sleep disturbance and suicidal ideas. The patient is not nervous/anxious.    Objective:  BP 136/60 (BP Location: Left Arm)   Pulse 70   Temp 98.3 F (36.8 C) (Oral)   Ht 5\' 10"  (1.778 m)   Wt 172 lb 3.2 oz (78.1 kg)   SpO2 95%   BMI 24.71 kg/m   BP Readings from Last 3 Encounters:  09/01/20 136/60  08/10/20 (!) 158/70  07/07/20 (!) 142/74    Wt Readings from Last 3 Encounters:  09/01/20 172 lb 3.2 oz (78.1 kg)  08/10/20 176 lb 12.8 oz (  80.2 kg)  07/07/20 174 lb 6.4 oz (79.1 kg)    Physical Exam Constitutional:      General: He is not in acute distress.    Appearance: He is well-developed.     Comments: NAD  Eyes:     Conjunctiva/sclera: Conjunctivae normal.     Pupils: Pupils are equal, round, and reactive to light.  Neck:     Thyroid: No thyromegaly.     Vascular: No JVD.  Cardiovascular:     Rate and Rhythm: Normal rate and regular rhythm.     Heart sounds: Normal heart sounds. No murmur heard.   No friction rub. No gallop.  Pulmonary:     Effort: Pulmonary effort is normal. No respiratory distress.     Breath sounds: Normal breath sounds. No wheezing or rales.  Chest:      Chest wall: No tenderness.  Abdominal:     General: Bowel sounds are normal. There is no distension.     Palpations: Abdomen is soft. There is no mass.     Tenderness: There is no abdominal tenderness. There is no guarding or rebound.  Musculoskeletal:        General: No tenderness. Normal range of motion.     Cervical back: Normal range of motion.  Lymphadenopathy:     Cervical: No cervical adenopathy.  Skin:    General: Skin is warm and dry.     Findings: No rash.  Neurological:     Mental Status: He is alert and oriented to person, place, and time.     Cranial Nerves: No cranial nerve deficit.     Motor: No abnormal muscle tone.     Coordination: Coordination normal.     Gait: Gait normal.     Deep Tendon Reflexes: Reflexes are normal and symmetric.  Psychiatric:        Behavior: Behavior normal.        Thought Content: Thought content normal.        Judgment: Judgment normal.   Sad Hip is nontender with palpation range of motion.  Straight leg elevation is negative  Lab Results  Component Value Date   WBC 6.3 04/01/2020   HGB 12.7 (L) 04/01/2020   HCT 37.9 (L) 04/01/2020   PLT 208.0 04/01/2020   GLUCOSE 95 07/01/2020   CHOL 140 04/01/2020   TRIG 96.0 04/01/2020   HDL 58.80 04/01/2020   LDLCALC 62 04/01/2020   ALT 37 07/01/2020   AST 48 (H) 07/01/2020   NA 136 07/01/2020   K 4.4 07/01/2020   CL 101 07/01/2020   CREATININE 1.32 07/01/2020   BUN 24 (H) 07/01/2020   CO2 28 07/01/2020   TSH 11.11 (H) 07/01/2020   PSA 0.87 10/20/2014   INR 3.5 (A) 08/12/2020   HGBA1C 5.5 07/01/2020    US Abdomen Limited RUQ (LIVER/GB)  Result Date: 04/26/2020 CLINICAL DATA:  Cirrhosis EXAM: ULTRASOUND ABDOMEN LIMITED RIGHT UPPER QUADRANT COMPARISON:  12/26/2017 FINDINGS: Gallbladder: Absent Common bile duct: Diameter: 6 mm in proximal diameter Liver: The liver contour is nodular and the hepatic echotexture is mildly, diffusely coarsened in keeping with changes of underlying  cirrhosis. No focal intrahepatic mass is identified. There is no intrahepatic biliary ductal dilation. Liver size is within normal limits. Portal vein is patent on color Doppler imaging with normal direction of blood flow towards the liver. Other: None. IMPRESSION: Parenchymal changes in keeping with cirrhosis. No focal hepatic mass identified. Electronically Signed   By: Fidela Salisbury MD  On: 04/26/2020 14:39    Assessment & Plan:     Walker Kehr, MD

## 2020-09-04 NOTE — Assessment & Plan Note (Signed)
Continue with core 

## 2020-09-04 NOTE — Assessment & Plan Note (Signed)
- 

## 2020-09-09 ENCOUNTER — Ambulatory Visit: Payer: Medicare Other | Admitting: Pharmacist

## 2020-09-09 ENCOUNTER — Other Ambulatory Visit: Payer: Self-pay

## 2020-09-09 DIAGNOSIS — Z9889 Other specified postprocedural states: Secondary | ICD-10-CM

## 2020-09-09 DIAGNOSIS — Z7901 Long term (current) use of anticoagulants: Secondary | ICD-10-CM | POA: Diagnosis not present

## 2020-09-09 DIAGNOSIS — I6350 Cerebral infarction due to unspecified occlusion or stenosis of unspecified cerebral artery: Secondary | ICD-10-CM | POA: Diagnosis not present

## 2020-09-09 DIAGNOSIS — G459 Transient cerebral ischemic attack, unspecified: Secondary | ICD-10-CM | POA: Diagnosis not present

## 2020-09-09 DIAGNOSIS — I059 Rheumatic mitral valve disease, unspecified: Secondary | ICD-10-CM | POA: Diagnosis not present

## 2020-09-09 DIAGNOSIS — I4891 Unspecified atrial fibrillation: Secondary | ICD-10-CM

## 2020-09-09 LAB — POCT INR: INR: 4.6 — AB (ref 2.0–3.0)

## 2020-09-09 NOTE — Patient Instructions (Signed)
Hold warfarin tomorrow (already took today), Sunday take 1/2 tablet (2.5mg ) then start taking Warfarin 5mg  daily except 2.5mg  on Tuesdays and Thursdays. Recheck INR in 2 weeks. [facility] [phone]

## 2020-09-14 DIAGNOSIS — M25551 Pain in right hip: Secondary | ICD-10-CM | POA: Diagnosis not present

## 2020-09-23 ENCOUNTER — Other Ambulatory Visit: Payer: Self-pay

## 2020-09-23 ENCOUNTER — Ambulatory Visit (INDEPENDENT_AMBULATORY_CARE_PROVIDER_SITE_OTHER): Payer: Medicare Other | Admitting: *Deleted

## 2020-09-23 DIAGNOSIS — I4891 Unspecified atrial fibrillation: Secondary | ICD-10-CM

## 2020-09-23 DIAGNOSIS — Z5181 Encounter for therapeutic drug level monitoring: Secondary | ICD-10-CM

## 2020-09-23 DIAGNOSIS — I059 Rheumatic mitral valve disease, unspecified: Secondary | ICD-10-CM

## 2020-09-23 DIAGNOSIS — Z9889 Other specified postprocedural states: Secondary | ICD-10-CM

## 2020-09-23 DIAGNOSIS — I6350 Cerebral infarction due to unspecified occlusion or stenosis of unspecified cerebral artery: Secondary | ICD-10-CM

## 2020-09-23 DIAGNOSIS — Z7901 Long term (current) use of anticoagulants: Secondary | ICD-10-CM

## 2020-09-23 LAB — POCT INR: INR: 3.4 — AB (ref 2.0–3.0)

## 2020-09-23 NOTE — Patient Instructions (Signed)
Description   Continue taking Warfarin 5mg  daily except 2.5mg  on Tuesdays and Thursdays. Recheck INR in 4 weeks. [facility] [phone]

## 2020-09-27 ENCOUNTER — Ambulatory Visit (INDEPENDENT_AMBULATORY_CARE_PROVIDER_SITE_OTHER): Payer: Medicare Other | Admitting: Internal Medicine

## 2020-09-27 ENCOUNTER — Other Ambulatory Visit: Payer: Self-pay

## 2020-09-27 ENCOUNTER — Encounter: Payer: Self-pay | Admitting: Internal Medicine

## 2020-09-27 DIAGNOSIS — K7460 Unspecified cirrhosis of liver: Secondary | ICD-10-CM | POA: Diagnosis not present

## 2020-09-27 DIAGNOSIS — F4321 Adjustment disorder with depressed mood: Secondary | ICD-10-CM | POA: Diagnosis not present

## 2020-09-27 DIAGNOSIS — G4700 Insomnia, unspecified: Secondary | ICD-10-CM | POA: Insufficient documentation

## 2020-09-27 DIAGNOSIS — N183 Chronic kidney disease, stage 3 unspecified: Secondary | ICD-10-CM | POA: Diagnosis not present

## 2020-09-27 DIAGNOSIS — M25551 Pain in right hip: Secondary | ICD-10-CM | POA: Diagnosis not present

## 2020-09-27 DIAGNOSIS — Z634 Disappearance and death of family member: Secondary | ICD-10-CM

## 2020-09-27 MED ORDER — HYDROCODONE-ACETAMINOPHEN 7.5-325 MG PO TABS: 0.5000 | ORAL_TABLET | Freq: Four times a day (QID) | ORAL | 0 refills | Status: DC | PRN

## 2020-09-27 MED ORDER — ESCITALOPRAM OXALATE 5 MG PO TABS: 5.0000 mg | ORAL_TABLET | Freq: Every day | ORAL | 5 refills | Status: DC

## 2020-09-27 NOTE — Assessment & Plan Note (Signed)
Lexapro at hs should help

## 2020-09-27 NOTE — Patient Instructions (Signed)
Valerian root for insomnia 

## 2020-09-27 NOTE — Assessment & Plan Note (Signed)
Norco w/caution F/u w/dr Aluisio Steroid inj is planned

## 2020-09-27 NOTE — Assessment & Plan Note (Addendum)
Worse Try Valerian root Lexapro at hs

## 2020-09-27 NOTE — Assessment & Plan Note (Signed)
No NSAIDs 

## 2020-09-27 NOTE — Progress Notes (Signed)
Subjective:  Patient ID: Anthony Gould, male    DOB: [birth year]  Age: 87 y.o. MRN: [id number]  CC: Insomnia (Pt states he need something to help him sleep ) and Groin Pain (Pt states he has already discuss the pain he been having in his groin area w/ Dr. Camila Li. Pain is not better requesting something for pain)   HPI Anthony Gould presents for R hip pain and insomnia - sleeping 2-3 hrs per night. C/o fatigue R hip steroid inj is planned by Dr Wynelle Link  Outpatient Medications Prior to Visit  Medication Sig Dispense Refill   allopurinol (ZYLOPRIM) 300 MG tablet TAKE 1 TABLET BY MOUTH DAILY AS NEEDED. GENERIC EQUIVALENT FOR ZYLOPRIM 90 tablet 3   amLODipine (NORVASC) 5 MG tablet TAKE 1/2 TABLET BY MOUTH EVERY DAY FOR 3 DAYS,IF BLOOD PRESSURE IS STILL HIGHER THAN 130 THEN INCREASE TO 1 TABLET BY MOUTH DAILY 90 tablet 3   aspirin 81 MG EC tablet Take 81 mg by mouth daily.     atorvastatin (LIPITOR) 20 MG tablet TAKE 1 TABLET BY MOUTH TWO TIMES A WEEK 26 tablet 3   bisoprolol (ZEBETA) 5 MG tablet Take 0.5 tablets (2.5 mg total) by mouth daily. 45 tablet 3   Cholecalciferol (VITAMIN D3) 50 MCG (2000 UT) capsule Take 1 capsule (2,000 Units total) by mouth daily. 100 capsule 3   dipyridamole (PERSANTINE) 50 MG tablet Take 1 tablet (50 mg total) by mouth 3 (three) times daily. 270 tablet 1   enalapril (VASOTEC) 20 MG tablet TAKE 1 TABLET BY MOUTH DAILY. 90 tablet 3   furosemide (LASIX) 40 MG tablet TAKE 1 TABLET BY MOUTH DAILY GENERIC EQUIVALENT FOR LASIX 90 tablet 3   latanoprost (XALATAN) 0.005 % ophthalmic solution Place 1 drop into both eyes at bedtime.     magnesium oxide (MAG-OX) 400 MG tablet TAKE 1 TABLET(400 MG) BY MOUTH TWICE DAILY 60 tablet 7   Milk Thistle 1000 MG CAPS Take 1 capsule by mouth daily.     pantoprazole (PROTONIX) 40 MG tablet TAKE 1 TABLET BY MOUTH DAILY 90 tablet 3   warfarin (COUMADIN) 5 MG tablet TAKE 1 TABLET BY MOUTH AS DIRECTED BY THE COUMADIN CLINIC 100 tablet 1    No facility-administered medications prior to visit.    ROS: Review of Systems  Constitutional:  Positive for fatigue. Negative for appetite change and unexpected weight change.  HENT:  Negative for congestion, nosebleeds, sneezing, sore throat and trouble swallowing.   Eyes:  Negative for itching and visual disturbance.  Respiratory:  Negative for cough.   Cardiovascular:  Negative for chest pain, palpitations and leg swelling.  Gastrointestinal:  Negative for abdominal distention, blood in stool, diarrhea and nausea.  Genitourinary:  Negative for frequency and hematuria.  Musculoskeletal:  Positive for arthralgias and gait problem. Negative for back pain, joint swelling and neck pain.  Skin:  Negative for rash.  Neurological:  Negative for dizziness, tremors, speech difficulty and weakness.  Psychiatric/Behavioral:  Positive for dysphoric mood and sleep disturbance. Negative for agitation. The patient is not nervous/anxious.    Objective:  BP (!) 142/70 (BP Location: Left Arm)   Pulse 83   Temp 98.2 F (36.8 C) (Oral)   Ht 5\' 10"  (1.778 m)   Wt 169 lb (76.7 kg)   SpO2 97%   BMI 24.25 kg/m   BP Readings from Last 3 Encounters:  09/27/20 (!) 142/70  09/01/20 136/60  08/10/20 (!) 158/70    Wt Readings from Last  3 Encounters:  09/27/20 169 lb (76.7 kg)  09/01/20 172 lb 3.2 oz (78.1 kg)  08/10/20 176 lb 12.8 oz (80.2 kg)    Physical Exam Constitutional:      General: He is not in acute distress.    Appearance: He is well-developed. He is not toxic-appearing.     Comments: NAD  Eyes:     Conjunctiva/sclera: Conjunctivae normal.     Pupils: Pupils are equal, round, and reactive to light.  Neck:     Thyroid: No thyromegaly.     Vascular: No JVD.  Cardiovascular:     Rate and Rhythm: Normal rate and regular rhythm.     Heart sounds: Normal heart sounds. No murmur heard.   No friction rub. No gallop.  Pulmonary:     Effort: Pulmonary effort is normal. No  respiratory distress.     Breath sounds: Normal breath sounds. No wheezing or rales.  Chest:     Chest wall: No tenderness.  Abdominal:     General: Bowel sounds are normal. There is no distension.     Palpations: Abdomen is soft. There is no mass.     Tenderness: There is no abdominal tenderness. There is no guarding or rebound.  Musculoskeletal:        General: No tenderness. Normal range of motion.     Cervical back: Normal range of motion.  Lymphadenopathy:     Cervical: No cervical adenopathy.  Skin:    General: Skin is warm and dry.     Findings: No rash.  Neurological:     Mental Status: He is alert and oriented to person, place, and time.     Cranial Nerves: No cranial nerve deficit.     Motor: No abnormal muscle tone.     Coordination: Coordination normal.     Gait: Gait normal.     Deep Tendon Reflexes: Reflexes are normal and symmetric.  Psychiatric:        Behavior: Behavior normal.        Thought Content: Thought content normal.        Judgment: Judgment normal.    Lab Results  Component Value Date   WBC 6.3 04/01/2020   HGB 12.7 (L) 04/01/2020   HCT 37.9 (L) 04/01/2020   PLT 208.0 04/01/2020   GLUCOSE 95 07/01/2020   CHOL 140 04/01/2020   TRIG 96.0 04/01/2020   HDL 58.80 04/01/2020   LDLCALC 62 04/01/2020   ALT 37 07/01/2020   AST 48 (H) 07/01/2020   NA 136 07/01/2020   K 4.4 07/01/2020   CL 101 07/01/2020   CREATININE 1.32 07/01/2020   BUN 24 (H) 07/01/2020   CO2 28 07/01/2020   TSH 11.11 (H) 07/01/2020   PSA 0.87 10/20/2014   INR 3.4 (A) 09/23/2020   HGBA1C 5.5 07/01/2020    US Abdomen Limited RUQ (LIVER/GB)  Result Date: 04/26/2020 CLINICAL DATA:  Cirrhosis EXAM: ULTRASOUND ABDOMEN LIMITED RIGHT UPPER QUADRANT COMPARISON:  12/26/2017 FINDINGS: Gallbladder: Absent Common bile duct: Diameter: 6 mm in proximal diameter Liver: The liver contour is nodular and the hepatic echotexture is mildly, diffusely coarsened in keeping with changes of  underlying cirrhosis. No focal intrahepatic mass is identified. There is no intrahepatic biliary ductal dilation. Liver size is within normal limits. Portal vein is patent on color Doppler imaging with normal direction of blood flow towards the liver. Other: None. IMPRESSION: Parenchymal changes in keeping with cirrhosis. No focal hepatic mass identified. Electronically Signed   By: Fidela Salisbury  MD   On: 04/26/2020 14:39    Assessment & Plan:     Walker Kehr, MD

## 2020-09-27 NOTE — Assessment & Plan Note (Signed)
Will use Norco w/caution

## 2020-10-05 DIAGNOSIS — M25551 Pain in right hip: Secondary | ICD-10-CM | POA: Diagnosis not present

## 2020-10-21 ENCOUNTER — Ambulatory Visit: Payer: Medicare Other

## 2020-10-21 ENCOUNTER — Other Ambulatory Visit: Payer: Self-pay

## 2020-10-21 DIAGNOSIS — I059 Rheumatic mitral valve disease, unspecified: Secondary | ICD-10-CM | POA: Diagnosis not present

## 2020-10-21 DIAGNOSIS — I4891 Unspecified atrial fibrillation: Secondary | ICD-10-CM | POA: Diagnosis not present

## 2020-10-21 DIAGNOSIS — I6350 Cerebral infarction due to unspecified occlusion or stenosis of unspecified cerebral artery: Secondary | ICD-10-CM | POA: Diagnosis not present

## 2020-10-21 DIAGNOSIS — Z7901 Long term (current) use of anticoagulants: Secondary | ICD-10-CM | POA: Diagnosis not present

## 2020-10-21 DIAGNOSIS — G459 Transient cerebral ischemic attack, unspecified: Secondary | ICD-10-CM

## 2020-10-21 DIAGNOSIS — Z9889 Other specified postprocedural states: Secondary | ICD-10-CM | POA: Diagnosis not present

## 2020-10-21 LAB — POCT INR: INR: 1.8 — AB (ref 2.0–3.0)

## 2020-10-21 NOTE — Patient Instructions (Signed)
-   take extra 1/2 tablet today and tomorrow, then  - continue taking Warfarin '5mg'$  daily except 2.'5mg'$  on Tuesdays and Thursdays.  - Recheck INR in 4 weeks.  [facility] [phone]

## 2020-11-11 DIAGNOSIS — M25551 Pain in right hip: Secondary | ICD-10-CM | POA: Diagnosis not present

## 2020-11-15 ENCOUNTER — Telehealth: Payer: Self-pay | Admitting: Lab

## 2020-11-15 NOTE — Chronic Care Management (AMB) (Signed)
  Chronic Care Management   Outreach Note  11/15/2020 Name: Anthony Gould MRN: [id number] DOB: Jul 12, 1933  Referred by: Cassandria Anger, MD Reason for referral : Medication Management   An unsuccessful telephone outreach was attempted today. The patient was referred to the pharmacist for assistance with care management and care coordination.   Follow Up Plan:   [place]

## 2020-11-18 ENCOUNTER — Ambulatory Visit: Payer: Medicare Other

## 2020-11-18 ENCOUNTER — Other Ambulatory Visit: Payer: Self-pay

## 2020-11-18 DIAGNOSIS — I4891 Unspecified atrial fibrillation: Secondary | ICD-10-CM

## 2020-11-18 DIAGNOSIS — Z9889 Other specified postprocedural states: Secondary | ICD-10-CM

## 2020-11-18 DIAGNOSIS — I059 Rheumatic mitral valve disease, unspecified: Secondary | ICD-10-CM | POA: Diagnosis not present

## 2020-11-18 DIAGNOSIS — Z7901 Long term (current) use of anticoagulants: Secondary | ICD-10-CM | POA: Diagnosis not present

## 2020-11-18 DIAGNOSIS — I6350 Cerebral infarction due to unspecified occlusion or stenosis of unspecified cerebral artery: Secondary | ICD-10-CM | POA: Diagnosis not present

## 2020-11-18 DIAGNOSIS — G459 Transient cerebral ischemic attack, unspecified: Secondary | ICD-10-CM

## 2020-11-18 LAB — POCT INR: INR: 2.1 (ref 2.0–3.0)

## 2020-11-18 NOTE — Patient Instructions (Signed)
Description   Take 1.5 tablets today, then start taking Warfarin '5mg'$  daily.  Recheck INR in 2 weeks.  [facility] [phone]

## 2020-11-21 DIAGNOSIS — H5203 Hypermetropia, bilateral: Secondary | ICD-10-CM | POA: Diagnosis not present

## 2020-11-21 DIAGNOSIS — H35033 Hypertensive retinopathy, bilateral: Secondary | ICD-10-CM | POA: Diagnosis not present

## 2020-11-21 DIAGNOSIS — H26493 Other secondary cataract, bilateral: Secondary | ICD-10-CM | POA: Diagnosis not present

## 2020-11-21 DIAGNOSIS — H40053 Ocular hypertension, bilateral: Secondary | ICD-10-CM | POA: Diagnosis not present

## 2020-12-02 ENCOUNTER — Inpatient Hospital Stay (HOSPITAL_COMMUNITY)
Admission: EM | Admit: 2020-12-02 | Discharge: 2020-12-18 | DRG: 342 | Disposition: A | Discharge status: Home / Self Care | Payer: Medicare Other | Attending: Internal Medicine | Admitting: Internal Medicine

## 2020-12-02 ENCOUNTER — Other Ambulatory Visit: Payer: Self-pay

## 2020-12-02 ENCOUNTER — Telehealth: Payer: Self-pay | Admitting: Internal Medicine

## 2020-12-02 ENCOUNTER — Ambulatory Visit: Payer: Medicare Other | Admitting: *Deleted

## 2020-12-02 ENCOUNTER — Encounter (HOSPITAL_COMMUNITY): Payer: Self-pay

## 2020-12-02 ENCOUNTER — Encounter: Payer: Self-pay | Admitting: Nurse Practitioner

## 2020-12-02 ENCOUNTER — Ambulatory Visit: Payer: Medicare Other | Admitting: Nurse Practitioner

## 2020-12-02 ENCOUNTER — Ambulatory Visit (INDEPENDENT_AMBULATORY_CARE_PROVIDER_SITE_OTHER)
Admission: RE | Admit: 2020-12-02 | Discharge: 2020-12-02 | Disposition: A | Discharge status: Home / Self Care | Payer: Medicare Other | Source: Ambulatory Visit | Attending: Nurse Practitioner | Admitting: Nurse Practitioner

## 2020-12-02 ENCOUNTER — Telehealth: Payer: Self-pay | Admitting: Nurse Practitioner

## 2020-12-02 VITALS — BP 120/64 | HR 79 | Temp 98.5°F | Ht 70.0 in | Wt 168.6 lb

## 2020-12-02 DIAGNOSIS — I272 Pulmonary hypertension, unspecified: Secondary | ICD-10-CM | POA: Diagnosis not present

## 2020-12-02 DIAGNOSIS — R1031 Right lower quadrant pain: Secondary | ICD-10-CM | POA: Diagnosis not present

## 2020-12-02 DIAGNOSIS — D62 Acute posthemorrhagic anemia: Secondary | ICD-10-CM | POA: Diagnosis not present

## 2020-12-02 DIAGNOSIS — D6859 Other primary thrombophilia: Secondary | ICD-10-CM | POA: Diagnosis not present

## 2020-12-02 DIAGNOSIS — I5032 Chronic diastolic (congestive) heart failure: Secondary | ICD-10-CM | POA: Diagnosis not present

## 2020-12-02 DIAGNOSIS — E1122 Type 2 diabetes mellitus with diabetic chronic kidney disease: Secondary | ICD-10-CM | POA: Diagnosis present

## 2020-12-02 DIAGNOSIS — Z7982 Long term (current) use of aspirin: Secondary | ICD-10-CM

## 2020-12-02 DIAGNOSIS — Z9889 Other specified postprocedural states: Secondary | ICD-10-CM | POA: Diagnosis not present

## 2020-12-02 DIAGNOSIS — E871 Hypo-osmolality and hyponatremia: Secondary | ICD-10-CM | POA: Diagnosis not present

## 2020-12-02 DIAGNOSIS — I6350 Cerebral infarction due to unspecified occlusion or stenosis of unspecified cerebral artery: Secondary | ICD-10-CM | POA: Diagnosis not present

## 2020-12-02 DIAGNOSIS — Z7901 Long term (current) use of anticoagulants: Secondary | ICD-10-CM

## 2020-12-02 DIAGNOSIS — Z882 Allergy status to sulfonamides status: Secondary | ICD-10-CM

## 2020-12-02 DIAGNOSIS — Z888 Allergy status to other drugs, medicaments and biological substances status: Secondary | ICD-10-CM | POA: Diagnosis not present

## 2020-12-02 DIAGNOSIS — I059 Rheumatic mitral valve disease, unspecified: Secondary | ICD-10-CM

## 2020-12-02 DIAGNOSIS — E559 Vitamin D deficiency, unspecified: Secondary | ICD-10-CM | POA: Diagnosis not present

## 2020-12-02 DIAGNOSIS — I10 Essential (primary) hypertension: Secondary | ICD-10-CM | POA: Diagnosis not present

## 2020-12-02 DIAGNOSIS — Z79899 Other long term (current) drug therapy: Secondary | ICD-10-CM | POA: Diagnosis not present

## 2020-12-02 DIAGNOSIS — I472 Ventricular tachycardia: Secondary | ICD-10-CM | POA: Diagnosis not present

## 2020-12-02 DIAGNOSIS — E785 Hyperlipidemia, unspecified: Secondary | ICD-10-CM | POA: Diagnosis present

## 2020-12-02 DIAGNOSIS — M109 Gout, unspecified: Secondary | ICD-10-CM | POA: Diagnosis present

## 2020-12-02 DIAGNOSIS — I130 Hypertensive heart and chronic kidney disease with heart failure and stage 1 through stage 4 chronic kidney disease, or unspecified chronic kidney disease: Secondary | ICD-10-CM | POA: Diagnosis not present

## 2020-12-02 DIAGNOSIS — E876 Hypokalemia: Secondary | ICD-10-CM | POA: Diagnosis not present

## 2020-12-02 DIAGNOSIS — I4891 Unspecified atrial fibrillation: Secondary | ICD-10-CM | POA: Diagnosis not present

## 2020-12-02 DIAGNOSIS — Z20822 Contact with and (suspected) exposure to covid-19: Secondary | ICD-10-CM | POA: Diagnosis present

## 2020-12-02 DIAGNOSIS — I2510 Atherosclerotic heart disease of native coronary artery without angina pectoris: Secondary | ICD-10-CM | POA: Diagnosis present

## 2020-12-02 DIAGNOSIS — I4821 Permanent atrial fibrillation: Secondary | ICD-10-CM | POA: Diagnosis present

## 2020-12-02 DIAGNOSIS — Z833 Family history of diabetes mellitus: Secondary | ICD-10-CM

## 2020-12-02 DIAGNOSIS — G459 Transient cerebral ischemic attack, unspecified: Secondary | ICD-10-CM | POA: Diagnosis not present

## 2020-12-02 DIAGNOSIS — K219 Gastro-esophageal reflux disease without esophagitis: Secondary | ICD-10-CM | POA: Diagnosis present

## 2020-12-02 DIAGNOSIS — K7460 Unspecified cirrhosis of liver: Secondary | ICD-10-CM | POA: Diagnosis not present

## 2020-12-02 DIAGNOSIS — K3580 Unspecified acute appendicitis: Secondary | ICD-10-CM | POA: Diagnosis present

## 2020-12-02 DIAGNOSIS — Z8673 Personal history of transient ischemic attack (TIA), and cerebral infarction without residual deficits: Secondary | ICD-10-CM

## 2020-12-02 DIAGNOSIS — N183 Chronic kidney disease, stage 3 unspecified: Secondary | ICD-10-CM | POA: Diagnosis present

## 2020-12-02 DIAGNOSIS — E119 Type 2 diabetes mellitus without complications: Secondary | ICD-10-CM

## 2020-12-02 DIAGNOSIS — N1831 Chronic kidney disease, stage 3a: Secondary | ICD-10-CM | POA: Diagnosis present

## 2020-12-02 DIAGNOSIS — I480 Paroxysmal atrial fibrillation: Secondary | ICD-10-CM | POA: Diagnosis present

## 2020-12-02 DIAGNOSIS — K353 Acute appendicitis with localized peritonitis, without perforation or gangrene: Secondary | ICD-10-CM | POA: Diagnosis not present

## 2020-12-02 DIAGNOSIS — Z8249 Family history of ischemic heart disease and other diseases of the circulatory system: Secondary | ICD-10-CM

## 2020-12-02 DIAGNOSIS — N179 Acute kidney failure, unspecified: Secondary | ICD-10-CM | POA: Diagnosis not present

## 2020-12-02 DIAGNOSIS — Z952 Presence of prosthetic heart valve: Secondary | ICD-10-CM | POA: Diagnosis not present

## 2020-12-02 DIAGNOSIS — E861 Hypovolemia: Secondary | ICD-10-CM | POA: Diagnosis not present

## 2020-12-02 DIAGNOSIS — E1159 Type 2 diabetes mellitus with other circulatory complications: Secondary | ICD-10-CM | POA: Diagnosis not present

## 2020-12-02 LAB — URINALYSIS WITH CULTURE, IF INDICATED
Bilirubin Urine: NEGATIVE
Hgb urine dipstick: NEGATIVE
Ketones, ur: NEGATIVE
Leukocytes,Ua: NEGATIVE
Nitrite: NEGATIVE
RBC / HPF: NONE SEEN (ref 0–?)
Specific Gravity, Urine: 1.015 (ref 1.000–1.030)
Total Protein, Urine: NEGATIVE
Urine Glucose: NEGATIVE
Urobilinogen, UA: 0.2 (ref 0.0–1.0)
pH: 5 (ref 5.0–8.0)

## 2020-12-02 LAB — COMPREHENSIVE METABOLIC PANEL
ALT: 18 U/L (ref 0–53)
AST: 28 U/L (ref 0–37)
Albumin: 3.9 g/dL (ref 3.5–5.2)
Alkaline Phosphatase: 77 U/L (ref 39–117)
BUN: 18 mg/dL (ref 6–23)
CO2: 25 mEq/L (ref 19–32)
Calcium: 9.3 mg/dL (ref 8.4–10.5)
Chloride: 98 mEq/L (ref 96–112)
Creatinine, Ser: 1.3 mg/dL (ref 0.40–1.50)
GFR: 50.13 mL/min — ABNORMAL LOW (ref >60.00)
Glucose, Bld: 116 mg/dL — ABNORMAL HIGH (ref 70–99)
Potassium: 4.3 mEq/L (ref 3.5–5.1)
Sodium: 132 mEq/L — ABNORMAL LOW (ref 135–145)
Total Bilirubin: 2 mg/dL — ABNORMAL HIGH (ref 0.2–1.2)
Total Protein: 6.8 g/dL (ref 6.0–8.3)

## 2020-12-02 LAB — RESP PANEL BY RT-PCR (FLU A&B, COVID) ARPGX2
Influenza A by PCR: NEGATIVE
Influenza B by PCR: NEGATIVE
SARS Coronavirus 2 by RT PCR: NEGATIVE

## 2020-12-02 LAB — CBC
HCT: 35.7 % — ABNORMAL LOW (ref 39.0–52.0)
Hemoglobin: 11.9 g/dL — ABNORMAL LOW (ref 13.0–17.0)
MCHC: 33.4 g/dL (ref 30.0–36.0)
MCV: 100.2 fl — ABNORMAL HIGH (ref 78.0–100.0)
Platelets: 203 10*3/uL (ref 150.0–400.0)
RBC: 3.56 Mil/uL — ABNORMAL LOW (ref 4.22–5.81)
RDW: 15.4 % (ref 11.5–15.5)
WBC: 11.9 10*3/uL — ABNORMAL HIGH (ref 4.0–10.5)

## 2020-12-02 LAB — POCT INR: INR: 3.3 — AB (ref 2.0–3.0)

## 2020-12-02 NOTE — Telephone Encounter (Signed)
-----   Message from [facility] sent at 12/02/2020  4:13 PM EDT ----- Please abstract and route to provider.

## 2020-12-02 NOTE — Progress Notes (Signed)
Subjective:  Patient ID: Anthony Gould, male    DOB: Nov 27, 1933  Age: 87 y.o. MRN: [id number]  CC:  Chief Complaint  Patient presents with   Flank Pain    (R) SIDE PAIN. Pt states its more of a soreness feeling. Started 3 days ago. Fever started last night   Abdominal Pain      HPI  This patient arrives today for the above.  He started experiencing RLQ and LLQ pain two days ago. He spiked a fever of 99.9 last night (reports his temperature is usually ~97). Reports nausea, loose stools (which is normal consistency for him), and anorexia over the last two days. Pain is worse when pressure is applied to the abdomen. He took a tylenol for his fever this morning, does not feel the pain has improved with tylenol. Pain is constant. Rates it as a 6/10. Reports history of liver scarring seen on imaging 5 years ago has abstained from all alcohol for the last 5 years due to this finding. Last CMP shows elevated AST of 48 with normal ALT this was collected in 06/2020. He has CKD IIIA with creatinine of 1.32. Patient on coumadin for thrombophilia related to atrial fibrillation and mechanical heart valve.   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   Normal nuclear stress test 12/25/01   no ischemia   NSVT (nonsustained ventricular tachycardia) (HCC)    Peripheral edema    venous insufficiency   Permanent atrial fibrillation (HCC)    Pulmonary hypertension (HCC)    Syncope    TIA (transient ischemic attack)    while on coumadin   Type II or unspecified type diabetes mellitus without mention of complication, not stated as uncontrolled    lifestyle mangement   Ventricular ectopy    symptomatic      Family History  Problem Relation  Age of Onset   Coronary artery disease Father    Diabetes Other    Coronary artery disease Other    Prostate cancer Neg Hx    Colon cancer Neg Hx     Social History   Social History Narrative   HSG, many management courses   Married '58   2 sons - '61, '62; 1 grandchild   Nonsmoker         Social History   Tobacco Use   Smoking status: Never   Smokeless tobacco: Never  Substance Use Topics   Alcohol use: No    Alcohol/week: 0.0 standard drinks     Current Meds  Medication Sig   allopurinol (ZYLOPRIM) 300 MG tablet TAKE 1 TABLET BY MOUTH DAILY AS NEEDED. GENERIC EQUIVALENT FOR ZYLOPRIM   amLODipine (NORVASC) 5 MG tablet TAKE 1/2 TABLET BY MOUTH EVERY DAY FOR 3 DAYS,IF BLOOD PRESSURE IS STILL HIGHER THAN 130 THEN INCREASE TO 1 TABLET BY MOUTH DAILY   aspirin 81 MG EC tablet Take 81 mg by mouth daily.   atorvastatin (LIPITOR) 20 MG tablet TAKE 1 TABLET BY MOUTH TWO TIMES A WEEK   bisoprolol (ZEBETA) 5 MG tablet Take 0.5 tablets (2.5 mg total) by mouth daily.   Cholecalciferol (VITAMIN D3) 50 MCG (2000 UT) capsule Take 1 capsule (  2,000 Units total) by mouth daily.   dipyridamole (PERSANTINE) 50 MG tablet Take 1 tablet (50 mg total) by mouth 3 (three) times daily.   enalapril (VASOTEC) 20 MG tablet TAKE 1 TABLET BY MOUTH DAILY.   escitalopram (LEXAPRO) 5 MG tablet Take 1 tablet (5 mg total) by mouth at bedtime.   furosemide (LASIX) 40 MG tablet TAKE 1 TABLET BY MOUTH DAILY GENERIC EQUIVALENT FOR LASIX   HYDROcodone-acetaminophen (NORCO) 7.5-325 MG tablet Take 0.5-1 tablets by mouth every 6 (six) hours as needed for moderate pain.   latanoprost (XALATAN) 0.005 % ophthalmic solution Place 1 drop into both eyes at bedtime.   magnesium oxide (MAG-OX) 400 MG tablet TAKE 1 TABLET(400 MG) BY MOUTH TWICE DAILY   Milk Thistle 1000 MG CAPS Take 1 capsule by mouth daily.   pantoprazole (PROTONIX) 40 MG tablet TAKE 1 TABLET BY MOUTH DAILY   warfarin (COUMADIN) 5 MG tablet TAKE 1 TABLET  BY MOUTH AS DIRECTED BY THE COUMADIN CLINIC    ROS:  Review of Systems  Constitutional:  Positive for fever (99.9 last night) and malaise/fatigue.  Respiratory:  Negative for shortness of breath.   Cardiovascular:  Negative for chest pain.  Gastrointestinal:  Positive for abdominal pain, diarrhea (will normally have loose stools) and nausea. Negative for blood in stool, constipation, melena and vomiting.  Neurological:  Negative for headaches.    Objective:   Today's Vitals: BP 120/64 (BP Location: Left Arm)   Pulse 79   Temp 98.5 F (36.9 C) (Oral)   Ht $R'5\' 10"'VT$  (1.778 m)   Wt 168 lb 9.6 oz (76.5 kg)   SpO2 97%   BMI 24.19 kg/m  Vitals with BMI 12/02/2020 09/27/2020 09/01/2020  Height $Remov'5\' 10"'WLmlDa$  $RemoveB'5\' 10"'hAuKfxFY$  $RemoveBef'5\' 10"'EFGIDbVkRD$   Weight 168 lbs 10 oz 169 lbs 172 lbs 3 oz  BMI 24.19 73.71 06.26  Systolic 948 546 270  Diastolic 64 70 60  Pulse 79 83 70     Physical Exam Vitals reviewed.  Constitutional:      Appearance: Normal appearance.  HENT:     Head: Normocephalic and atraumatic.  Cardiovascular:     Rate and Rhythm: Normal rate. Rhythm regularly irregular.     Comments: Mechanical heart valve click heard Pulmonary:     Effort: Pulmonary effort is normal.     Breath sounds: Normal breath sounds.  Abdominal:     General: Abdomen is flat. A surgical scar is present. Bowel sounds are increased. There is no distension or abdominal bruit.     Palpations: Abdomen is soft. There is no mass.     Tenderness: There is abdominal tenderness in the right lower quadrant. There is no guarding.     Hernia: No hernia is present.  Musculoskeletal:     Cervical back: Neck supple.  Skin:    General: Skin is warm and dry.  Neurological:     Mental Status: He is alert and oriented to person, place, and time.  Psychiatric:        Mood and Affect: Mood normal.        Behavior: Behavior normal.        Thought Content: Thought content normal.        Judgment: Judgment normal.         Assessment and Plan    1. Right lower quadrant abdominal pain      Plan: Concern for acute appendicitis vs. Other etiology such as UTI/gastroenteritis. Send for STAT CT scan of abdomen/pelvis today, collect CBC  and CMP as well as U/A. Patient cautioned regarding red flag symptoms (worsening pain, nausea, vomiting, fever) and to proceed to emergency department if these were to occur. He voices understanding.    Tests ordered Orders Placed This Encounter  Procedures   CT Abdomen Pelvis Wo Contrast   CBC   Comp Met (CMET)   Urinalysis with Culture, if indicated       No orders of the defined types were placed in this encounter.   Patient to follow-up in as scheduled next week or sooner as needed.   Ailene Ards, NP

## 2020-12-02 NOTE — Patient Instructions (Signed)
Description   Continue taking Warfarin '5mg'$  daily.  Recheck INR in 3 weeks.  [facility] [phone]

## 2020-12-02 NOTE — Telephone Encounter (Signed)
Pt had ov w/ Anthony Ruths, NP.Marland KitchenJohny Chess

## 2020-12-02 NOTE — Telephone Encounter (Signed)
STAT CT shows acute appendicitis. I have attempted to call the patient and his spouse multiple times on all numbers listed in the computer, I have been unable to reach them. I have left voicemails to have them call my personal cell phone once the get the message. Will advise patient to proceed to the emergency department once I am able to speak to him.   Update 1648: patient called back and I did tell him about his CT scan results and that he does have acute appendicitis. I have directed him to go to the emergency department immediately. I have also called the ER charge nurse and notified him of the situation and that the patient is on his way. He voiced his understanding and tells me he will notify the ER MD.

## 2020-12-02 NOTE — Telephone Encounter (Signed)
   Patient calling to report right side pain, getting worse over the past 2 days. Temp 99 Diarrhea  Fatigue Weakness  Call sent to [facility] for triage

## 2020-12-02 NOTE — Telephone Encounter (Signed)
[facility]   Caller states he is having a soreness in the right side and a low grade fever. It started on Wednesday. Temp last night was 99.4 oral. took a tylenol and is 99.4 now. The soreness is even with his navel and is 6 inches to the right of his navel on the right side, but does not feel it in his belly.  Advised to see PCP within 4 hours.

## 2020-12-02 NOTE — ED Triage Notes (Signed)
Pt here from PCP, went for abd pain & was sent for CT; Pt was called back, told that CT showed appendicitis, was advised to come to ED for eval/tx  5/10 "when you poke it"

## 2020-12-02 NOTE — ED Provider Notes (Signed)
Emergency Medicine Provider Triage Evaluation Note  Anthony Gould , a 87 y.o. male  was evaluated in triage.  Pt complains of known appendicitis.  He went to PCP and had a CT scan earlier today showing concern for appendicitis.  He took his last dose of warfarin this morning.  He had labs obtained that are viewable in the system prior to arrival today including a CBC, CMP, UA, and PT/INR.  Review of Systems  Positive: Abdominal pain, slight fever earlier Negative: Cough, shortness of breath  Physical Exam  BP 129/67 (BP Location: Right Arm)   Pulse 99   Temp 98.2 F (36.8 C) (Oral)   Resp 18   SpO2 100%  Gen:   Awake, no distress   Resp:  Normal effort  MSK:   Moves extremities without difficulty  Other:  Patient is awake and alert, answers questions appropriately.  Medical Decision Making  Medically screening exam initiated at 7:13 PM.  Appropriate orders placed.  Anthony Gould was informed that the remainder of the evaluation will be completed by another provider, this initial triage assessment does not replace that evaluation, and the importance of remaining in the ED until their evaluation is complete.  Patient comes in with CT scan showing confirmed appendicitis.  He had labs obtained earlier today that are viewable in epic therefore they are not reordered.  We will order a COVID test.  Attempted to call charge after I saw patient with no answer.  Will attempt to call back again.  Called charge at [phone] to request patient be roomed soon.   Note: Portions of this report may have been transcribed using voice recognition software. Every effort was made to ensure accuracy; however, inadvertent computerized transcription errors may be present    Ollen Gross 12/02/20 1920    Drenda Freeze, MD 12/02/20 [phone]

## 2020-12-03 ENCOUNTER — Encounter (HOSPITAL_COMMUNITY): Payer: Self-pay | Admitting: Internal Medicine

## 2020-12-03 DIAGNOSIS — K3580 Unspecified acute appendicitis: Secondary | ICD-10-CM | POA: Diagnosis present

## 2020-12-03 DIAGNOSIS — E861 Hypovolemia: Secondary | ICD-10-CM | POA: Diagnosis not present

## 2020-12-03 DIAGNOSIS — K7460 Unspecified cirrhosis of liver: Secondary | ICD-10-CM | POA: Diagnosis present

## 2020-12-03 DIAGNOSIS — I10 Essential (primary) hypertension: Secondary | ICD-10-CM | POA: Diagnosis not present

## 2020-12-03 DIAGNOSIS — Z952 Presence of prosthetic heart valve: Secondary | ICD-10-CM

## 2020-12-03 DIAGNOSIS — Z8673 Personal history of transient ischemic attack (TIA), and cerebral infarction without residual deficits: Secondary | ICD-10-CM | POA: Diagnosis not present

## 2020-12-03 DIAGNOSIS — I130 Hypertensive heart and chronic kidney disease with heart failure and stage 1 through stage 4 chronic kidney disease, or unspecified chronic kidney disease: Secondary | ICD-10-CM | POA: Diagnosis present

## 2020-12-03 DIAGNOSIS — Z20822 Contact with and (suspected) exposure to covid-19: Secondary | ICD-10-CM | POA: Diagnosis present

## 2020-12-03 DIAGNOSIS — K353 Acute appendicitis with localized peritonitis, without perforation or gangrene: Secondary | ICD-10-CM | POA: Diagnosis present

## 2020-12-03 DIAGNOSIS — Z882 Allergy status to sulfonamides status: Secondary | ICD-10-CM | POA: Diagnosis not present

## 2020-12-03 DIAGNOSIS — N1831 Chronic kidney disease, stage 3a: Secondary | ICD-10-CM | POA: Diagnosis present

## 2020-12-03 DIAGNOSIS — I5032 Chronic diastolic (congestive) heart failure: Secondary | ICD-10-CM | POA: Diagnosis present

## 2020-12-03 DIAGNOSIS — I472 Ventricular tachycardia: Secondary | ICD-10-CM | POA: Diagnosis not present

## 2020-12-03 DIAGNOSIS — E1122 Type 2 diabetes mellitus with diabetic chronic kidney disease: Secondary | ICD-10-CM | POA: Diagnosis present

## 2020-12-03 DIAGNOSIS — D6859 Other primary thrombophilia: Secondary | ICD-10-CM | POA: Diagnosis present

## 2020-12-03 DIAGNOSIS — I4821 Permanent atrial fibrillation: Secondary | ICD-10-CM | POA: Diagnosis present

## 2020-12-03 DIAGNOSIS — Z888 Allergy status to other drugs, medicaments and biological substances status: Secondary | ICD-10-CM | POA: Diagnosis not present

## 2020-12-03 DIAGNOSIS — Z7982 Long term (current) use of aspirin: Secondary | ICD-10-CM | POA: Diagnosis not present

## 2020-12-03 DIAGNOSIS — I272 Pulmonary hypertension, unspecified: Secondary | ICD-10-CM | POA: Diagnosis present

## 2020-12-03 DIAGNOSIS — N183 Chronic kidney disease, stage 3 unspecified: Secondary | ICD-10-CM | POA: Diagnosis not present

## 2020-12-03 DIAGNOSIS — Z7901 Long term (current) use of anticoagulants: Secondary | ICD-10-CM | POA: Diagnosis not present

## 2020-12-03 DIAGNOSIS — E785 Hyperlipidemia, unspecified: Secondary | ICD-10-CM | POA: Diagnosis present

## 2020-12-03 DIAGNOSIS — E1159 Type 2 diabetes mellitus with other circulatory complications: Secondary | ICD-10-CM | POA: Diagnosis not present

## 2020-12-03 DIAGNOSIS — D62 Acute posthemorrhagic anemia: Secondary | ICD-10-CM | POA: Diagnosis not present

## 2020-12-03 DIAGNOSIS — I2510 Atherosclerotic heart disease of native coronary artery without angina pectoris: Secondary | ICD-10-CM | POA: Diagnosis present

## 2020-12-03 DIAGNOSIS — Z79899 Other long term (current) drug therapy: Secondary | ICD-10-CM | POA: Diagnosis not present

## 2020-12-03 DIAGNOSIS — N179 Acute kidney failure, unspecified: Secondary | ICD-10-CM | POA: Diagnosis not present

## 2020-12-03 DIAGNOSIS — E871 Hypo-osmolality and hyponatremia: Secondary | ICD-10-CM | POA: Diagnosis present

## 2020-12-03 LAB — COMPREHENSIVE METABOLIC PANEL
ALT: 27 U/L (ref 0–44)
AST: 37 U/L (ref 15–41)
Albumin: 3.6 g/dL (ref 3.5–5.0)
Alkaline Phosphatase: 78 U/L (ref 38–126)
Anion gap: 10 (ref 5–15)
BUN: 17 mg/dL (ref 8–23)
CO2: 21 mmol/L — ABNORMAL LOW (ref 22–32)
Calcium: 9 mg/dL (ref 8.9–10.3)
Chloride: 98 mmol/L (ref 98–111)
Creatinine, Ser: 1.26 mg/dL — ABNORMAL HIGH (ref 0.61–1.24)
GFR, Estimated: 56 mL/min — ABNORMAL LOW (ref >60)
Glucose, Bld: 119 mg/dL — ABNORMAL HIGH (ref 70–99)
Potassium: 3.8 mmol/L (ref 3.5–5.1)
Sodium: 129 mmol/L — ABNORMAL LOW (ref 135–145)
Total Bilirubin: 1.9 mg/dL — ABNORMAL HIGH (ref 0.3–1.2)
Total Protein: 6.6 g/dL (ref 6.5–8.1)

## 2020-12-03 LAB — PROTIME-INR
INR: 3.3 — ABNORMAL HIGH (ref 0.8–1.2)
Prothrombin Time: 33.8 seconds — ABNORMAL HIGH (ref 11.4–15.2)

## 2020-12-03 LAB — HEMOGLOBIN A1C
Hgb A1c MFr Bld: 5.3 % (ref 4.8–5.6)
Mean Plasma Glucose: 105.41 mg/dL

## 2020-12-03 LAB — CBC
HCT: 35.7 % — ABNORMAL LOW (ref 39.0–52.0)
Hemoglobin: 12.1 g/dL — ABNORMAL LOW (ref 13.0–17.0)
MCH: 33.4 pg (ref 26.0–34.0)
MCHC: 33.9 g/dL (ref 30.0–36.0)
MCV: 98.6 fL (ref 80.0–100.0)
Platelets: 180 10*3/uL (ref 150–400)
RBC: 3.62 MIL/uL — ABNORMAL LOW (ref 4.22–5.81)
RDW: 13.9 % (ref 11.5–15.5)
WBC: 12.6 10*3/uL — ABNORMAL HIGH (ref 4.0–10.5)
nRBC: 0 % (ref 0.0–0.2)

## 2020-12-03 LAB — GLUCOSE, CAPILLARY
Glucose-Capillary: 88 mg/dL (ref 70–99)
Glucose-Capillary: 75 mg/dL (ref 70–99)

## 2020-12-03 MED ORDER — ACETAMINOPHEN 650 MG RE SUPP: 650.0000 mg | Freq: Four times a day (QID) | RECTAL | Status: DC | PRN

## 2020-12-03 MED ORDER — ONDANSETRON HCL 4 MG/2ML IJ SOLN: 4.0000 mg | Freq: Four times a day (QID) | INTRAMUSCULAR | Status: DC | PRN

## 2020-12-03 MED ORDER — FUROSEMIDE 10 MG/ML IJ SOLN
40.0000 mg | Freq: Two times a day (BID) | INTRAMUSCULAR | Status: DC
  Administered 2020-12-03 – 2020-12-05 (×4): 40 mg via INTRAVENOUS
  Filled 2020-12-03 (×4): qty 4

## 2020-12-03 MED ORDER — LACTATED RINGERS IV SOLN: INTRAVENOUS | Status: DC

## 2020-12-03 MED ORDER — ACETAMINOPHEN 325 MG PO TABS
650.0000 mg | ORAL_TABLET | Freq: Four times a day (QID) | ORAL | Status: DC | PRN
  Administered 2020-12-07 – 2020-12-08 (×3): 650 mg via ORAL
  Filled 2020-12-03 (×3): qty 2

## 2020-12-03 MED ORDER — PIPERACILLIN-TAZOBACTAM 3.375 G IVPB
3.3750 g | Freq: Three times a day (TID) | INTRAVENOUS | Status: DC
  Administered 2020-12-03 – 2020-12-07 (×15): 3.375 g via INTRAVENOUS
  Filled 2020-12-03 (×16): qty 50

## 2020-12-03 MED ORDER — HYDROMORPHONE HCL 1 MG/ML IJ SOLN
0.5000 mg | INTRAMUSCULAR | Status: DC | PRN
Start: 2020-12-03 — End: 2020-12-06

## 2020-12-03 MED ORDER — PIPERACILLIN-TAZOBACTAM 3.375 G IVPB 30 MIN: 3.3750 g | Freq: Once | INTRAVENOUS | Status: DC

## 2020-12-03 MED ORDER — METOPROLOL TARTRATE 5 MG/5ML IV SOLN
5.0000 mg | Freq: Three times a day (TID) | INTRAVENOUS | Status: DC
  Administered 2020-12-03 – 2020-12-06 (×8): 5 mg via INTRAVENOUS
  Filled 2020-12-03 (×8): qty 5

## 2020-12-03 MED ORDER — VITAMIN K1 10 MG/ML IJ SOLN
2.0000 mg | Freq: Once | INTRAVENOUS | Status: AC
  Administered 2020-12-03: 2 mg via INTRAVENOUS
  Filled 2020-12-03 (×2): qty 0.2

## 2020-12-03 MED ORDER — ONDANSETRON HCL 4 MG PO TABS
4.0000 mg | ORAL_TABLET | Freq: Four times a day (QID) | ORAL | Status: DC | PRN
  Administered 2020-12-07: 4 mg via ORAL
  Filled 2020-12-03: qty 1

## 2020-12-03 NOTE — H&P (Signed)
History and Physical    Anthony Gould [id number] DOB: 1934-03-08 DOA: 12/02/2020  PCP: Cassandria Anger, MD  Patient coming from: Home.  I have personally briefly reviewed patient's old medical records in [place]  Chief Complaint: Abdominal pain.  HPI: Anthony Gould is a 87 y.o. male with medical history significant of mitral regurgitation with a history of Medtronic-Hall mitral valve replacement in 1986, permanent atrial fibrillation, pulmonary hypertension, NSVT, history of ventricular ectopy, history of TIA, syncopal episode, hypertension, type II DM, stage IIIa CKD, elevated TSH, hypertension, hyperlipidemia is coming to the emergency department due to abdominal pain associated with emesis, loose stools, decreased appetite and malaise since Wednesday.  He denied melena or hematochezia.  No dysuria, frequency or hematuria.  He denied fever, chills, night sweats, rhinorrhea, sore throat, wheezing, dyspnea or hemoptysis.  No chest pain, palpitations, diaphoresis, PND, orthopnea or recent pitting edema of the lower extremities.  No polyuria, polydipsia, polyphagia or blurred vision.  ED Course: Initial vital signs were temperature 98.2 F, pulse 99, respiration 18, BP 129/67 mmHg O2 sat 100% on room air.  The patient received Zosyn 3.375 g in the emergency department.  Lab work: His urinalysis chemical exam was unremarkable. CBC showed a white count of 11.9, hemoglobin 11.9 g/dL and platelets 203.  CMP showed a sodium 132 mmol/L, glucose 116 and total bilirubin of 2.0 mg/dL.  The rest of the CMP values were unremarkable.  Imaging: CT abdomen/pelvis without contrast show acute appendicitis, appendiceal diameter of up to 1.3 cm.  Appendicolith and the appendiceal orifice with periappendiceal inflammatory stranding.  There was no gas or abscess currently seen.  There was also systemic and aortic atherosclerosis.  Coronary atherosclerosis.  Hepatic cirrhosis.  Review of  Systems: As per HPI otherwise all other systems reviewed and are negative.  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   Normal nuclear stress test 12/25/01   no ischemia   NSVT (nonsustained ventricular tachycardia) (HCC)    Peripheral edema    venous insufficiency   Permanent atrial fibrillation (HCC)    Pulmonary hypertension (HCC)    Syncope    TIA (transient ischemic attack)    while on coumadin   Type II or unspecified type diabetes mellitus without mention of complication, not stated as uncontrolled    lifestyle mangement   Ventricular ectopy    symptomatic   Past Surgical History:  Procedure Laterality Date   CHOLECYSTECTOMY, LAPAROSCOPIC  2003   MITRAL VALVE REPLACEMENT     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   Social History  reports that he has never smoked. He has never used smokeless tobacco. He reports that he does not drink alcohol and does not use drugs.  Allergies  Allergen Reactions   Cardizem [Diltiazem]     Bradycardia per pt Doing well on Amlodipine   Sulfonamide Derivatives     Unknown    Family History  Problem Relation Age of Onset   Coronary artery disease Father    Diabetes Other    Coronary artery disease Other    Prostate cancer Neg Hx    Colon cancer Neg Hx    Prior to Admission  medications   Medication Sig Start Date End Date Taking? Authorizing Provider  allopurinol (ZYLOPRIM) 300 MG tablet TAKE 1 TABLET BY MOUTH DAILY AS NEEDED. GENERIC EQUIVALENT FOR ZYLOPRIM 04/14/20   Plotnikov, Evie Lacks, MD  amLODipine (NORVASC) 5 MG tablet TAKE 1/2 TABLET BY MOUTH EVERY DAY FOR 3 DAYS,IF BLOOD PRESSURE IS STILL HIGHER THAN 130 THEN INCREASE TO 1 TABLET BY MOUTH DAILY 03/28/20   Charlie Pitter, [name]  aspirin 81 MG EC tablet Take 81 mg by mouth daily.    [provider]  atorvastatin (LIPITOR) 20 MG tablet TAKE 1 TABLET BY MOUTH TWO TIMES A WEEK 04/14/20   Plotnikov, Evie Lacks, MD  bisoprolol (ZEBETA) 5 MG tablet Take 0.5 tablets (2.5 mg total) by mouth daily. 12/08/19   Dunn, Nedra Hai, [name]  Cholecalciferol (VITAMIN D3) 50 MCG (2000 UT) capsule Take 1 capsule (2,000 Units total) by mouth daily. 10/01/19   Plotnikov, Evie Lacks, MD  dipyridamole (PERSANTINE) 50 MG tablet Take 1 tablet (50 mg total) by mouth 3 (three) times daily. 08/08/20   Burnell Blanks, MD  enalapril (VASOTEC) 20 MG tablet TAKE 1 TABLET BY MOUTH DAILY. 08/07/20   Plotnikov, Evie Lacks, MD  escitalopram (LEXAPRO) 5 MG tablet Take 1 tablet (5 mg total) by mouth at bedtime. 09/27/20   Plotnikov, Evie Lacks, MD  furosemide (LASIX) 40 MG tablet TAKE 1 TABLET BY MOUTH DAILY GENERIC EQUIVALENT FOR LASIX 07/04/20   Plotnikov, Evie Lacks, MD  HYDROcodone-acetaminophen (NORCO) 7.5-325 MG tablet Take 0.5-1 tablets by mouth every 6 (six) hours as needed for moderate pain. 09/27/20   Plotnikov, Evie Lacks, MD  latanoprost (XALATAN) 0.005 % ophthalmic solution Place 1 drop into both eyes at bedtime.    [provider]  magnesium oxide (MAG-OX) 400 MG tablet TAKE 1 TABLET(400 MG) BY MOUTH TWICE DAILY 04/14/20   Dunn, [name] N, [name]  Milk Thistle 1000 MG CAPS Take 1 capsule by mouth daily.    [provider]  pantoprazole (PROTONIX) 40 MG tablet TAKE 1 TABLET BY MOUTH DAILY 08/07/20   Plotnikov, Evie Lacks, MD  warfarin (COUMADIN) 5 MG tablet TAKE 1 TABLET BY MOUTH AS DIRECTED BY THE COUMADIN CLINIC 06/27/20   Burnell Blanks, MD   Physical Exam: Vitals:   12/02/20 1739 12/02/20 2121  BP: 129/67 127/72  Pulse: 99 97  Resp: 18 17  Temp: 98.2 F (36.8 C) 98.9 F (37.2 C)  TempSrc: Oral Oral  SpO2: 100% 100%   Constitutional: NAD, calm, comfortable Eyes: PERRL, lids and conjunctivae normal ENMT: Mucous  membranes are mildly dry.  Posterior pharynx clear of any exudate or lesions.  Neck: normal, supple, no masses, no thyromegaly Respiratory: Clear to auscultation bilaterally, no wheezing, no crackles. Normal respiratory effort. No accessory muscle use.  Cardiovascular: Regular rate and rhythm, no murmurs / rubs / gallops. No extremity edema. 2+ pedal pulses. No carotid bruits.  Abdomen: No distention.  Bowel sounds positive.  Soft, positive RLQ tenderness, positive McBurney's sign, no guarding or rebound, no masses palpated. No hepatosplenomegaly. Musculoskeletal: Mild generalized weakness.  No clubbing / cyanosis.Good ROM, no contractures. Normal muscle tone.  Skin: no acute rashes, lesions, ulcers on very limited dermatological examination. Neurologic: CN 2-12 grossly intact. Sensation intact, DTR normal. Strength 5/5 in all 4.  Psychiatric: Normal judgment and insight. Alert and oriented x 3. Normal mood.   Labs on Admission: I have personally reviewed following labs and imaging studies  CBC: Recent Labs  Lab 12/02/20 1143  WBC 11.9*  HGB 11.9*  HCT 35.7*  MCV 100.2*  PLT [id number]    Basic Metabolic Panel: Recent Labs  Lab 12/02/20 1143  NA 132*  K 4.3  CL 98  CO2 25  GLUCOSE 116*  BUN 18  CREATININE 1.30  CALCIUM 9.3    GFR: Estimated Creatinine Clearance: 42.9 mL/min (by C-G formula based on SCr of 1.3 mg/dL).  Liver Function Tests: Recent Labs  Lab 12/02/20 1143  AST 28  ALT 18  ALKPHOS 77  BILITOT 2.0*  PROT 6.8  ALBUMIN 3.9   Urine analysis:    Component Value Date/Time   COLORURINE YELLOW 12/02/2020 1143   APPEARANCEUR CLEAR 12/02/2020 1143   LABSPEC 1.015 12/02/2020 1143   PHURINE 5.0 12/02/2020 1143   GLUCOSEU NEGATIVE 12/02/2020 1143   [place] 12/02/2020 1143   [place] 12/02/2020 1143   [place] 12/02/2020 1143   PROTEINUR 30 (A) 08/07/2013 1633   UROBILINOGEN 0.2 12/02/2020 1143   NITRITE NEGATIVE 12/02/2020 1143    LEUKOCYTESUR NEGATIVE 12/02/2020 1143   Radiological Exams on Admission: CT Abdomen Pelvis Wo Contrast  Addendum Date: 12/02/2020   ADDENDUM REPORT: 12/02/2020 16:30 ADDENDUM: The original report was by Dr. Van Clines. The following addendum is by Dr. Van Clines: These results were called by telephone at the time of interpretation on 12/02/2020 at 4:24 pm to provider Dr. Jeralyn Ruths , who verbally acknowledged these results. Electronically Signed   By: Van Clines M.D.   On: 12/02/2020 16:30   Result Date: 12/02/2020 CLINICAL DATA:  Right lower quadrant abdominal pain. Reduced appetite. Low-grade fever. EXAM: CT ABDOMEN AND PELVIS WITHOUT CONTRAST TECHNIQUE: Multidetector CT imaging of the abdomen and pelvis was performed following the standard protocol without IV contrast. COMPARISON:  09/23/2015 and hepatobiliary ultrasound of 04/26/2020 FINDINGS: Lower chest: Mitral valve prosthesis. Atherosclerotic calcification involving the descending thoracic aorta and right coronary artery. Mild cardiomegaly. Hepatobiliary: Nodular contour the liver compatible with cirrhosis. No noncontrast CT evidence of hepatic mass. Cholecystectomy. No biliary dilatation identified. Pancreas: Unremarkable Spleen: Unremarkable Adrenals/Urinary Tract: Stable scarring of the right kidney upper pole. No hydronephrosis or renal calculi identified. The adrenal glands appear unremarkable. Urinary bladder unremarkable. Stomach/Bowel: Acute appendicitis is present, with an appendicolith along the appendiceal orifice; appendiceal diameter 1.3 cm, and periappendiceal inflammatory stranding. No abnormal extraluminal gas or abscess. Vascular/Lymphatic: Atherosclerosis is present, including aortoiliac atherosclerotic disease. Reproductive: Unremarkable Other: No supplemental non-categorized findings. Musculoskeletal: Mild lumbar spondylosis. IMPRESSION: 1. Acute appendicitis, appendiceal diameter of up to 1.3 cm; appendicolith  the appendiceal orifice; and Peri appendiceal inflammatory stranding. No extraluminal gas or abscess is currently identified. 2. Systemic and aortic Atherosclerosis (ICD10-I70.0). Coronary atherosclerosis. 3. Hepatic cirrhosis. Radiology assistant personnel have been notified to put me in telephone contact with the referring physician or the referring physician's clinical representative in order to discuss these findings. Once this communication is established I will issue an addendum to this report for documentation purposes. Electronically Signed: By: Van Clines M.D. On: 12/02/2020 16:13    03/09/2019 echocardiogram complete IMPRESSIONS:   1. Left ventricular ejection fraction, by visual estimation, is 55 to  60%. The left ventricle has normal function. There is moderately increased  left ventricular hypertrophy.   2. Left ventricular diastolic function could not be evaluated.   3. The left ventricle has no regional wall motion abnormalities.   4. Global right ventricle has normal systolic function.The right  ventricular size is normal. No increase in right ventricular wall  thickness.  5. Left atrial size was severely dilated.   6. Right atrial size was severely dilated.   7. The mitral valve has been repaired/replaced. Trivial mitral valve  regurgitation.   8. The tricuspid valve is normal in structure. Tricuspid valve  regurgitation moderate.   9. The aortic valve is normal in structure. Aortic valve regurgitation is  not visualized. No evidence of aortic valve sclerosis or stenosis.  10. The pulmonic valve was grossly normal. Pulmonic valve regurgitation is  trivial.  11. Moderately elevated pulmonary artery systolic pressure.  12. The atrial septum is grossly normal.   EKG: Independently reviewed.   Assessment/Plan Principal Problem:   Acute appendicitis Clinically stable. Admit to telemetry as/inpatient. Keep NPO. Continue Zosyn every 8 hours. Analgesics as  needed. Antiemetics as needed. Warfarin has been held. May need to bridge with heparin. General surgery is on the case.  Active Problems:   DM2 (diabetes mellitus, type 2) (HCC) Currently NPO. CBG monitoring every 6 hours.    Hyperlipidemia Currently NPO. Resume statin once cleared for diet.    Essential hypertension Metoprolol 5 mg IVP every 8 hours. Monitor BP and heart rate.    Coronary atherosclerosis Holding aspirin and warfarin.    Atrial fibrillation (HCC) CHA?DS?-VASc Score of at least 6. Anticoagulation has been held. Beta-blocker being given parenterally now.    MITRAL VALVE REPLACEMENT, HX OF Warfarin on hold. Check PT/INR daily. May need to bridge with warfarin.    Chronic diastolic heart failure (HCC) No signs of decompensation. Resume meds once cleared for oral intake.     DVT prophylaxis: On warfarin. Code Status:   Full code. Family Communication:   Disposition Plan:   Patient is from:  Home.  Anticipated DC to:  Home.  Anticipated DC date:  12/06/2020.[name]  Anticipated DC barriers: Clinical status/consultant sign off.  Consults called:  General surgery Admission status:  Inpatient/telemetry.   Severity of Illness: High severity after presenting with acute appendicitis with a history of mechanical heart valve on warfarin.  The patient will need to remain in the hospital for IV antibiotic therapy and close follow-up by surgery.  There is a chance that he may require surgical intervention.  Reubin Milan MD [facility]  How to contact the [facility] Attending or Consulting provider [place] or covering provider during after hours [place], for this patient?   Check the care team in [facility] and look for a) attending/consulting [facility] provider listed and b) the [facility] team listed Log into [email or web address] and use [place]'s universal password to access. If you do not have the password, please contact the hospital operator. Locate the [facility] provider you are  looking for under [facility] and page to a number that you can be directly reached. If you still have difficulty reaching the provider, please page the [facility] (Director on Call) for the Hospitalists listed on amion for assistance.  12/03/2020, 1:25 AM   This document was prepared using [employer] and may contain some unintended transcription errors.

## 2020-12-03 NOTE — Progress Notes (Addendum)
Progress Note    Anthony Gould  [id number] DOB: [birth year]  DOA: 12/02/2020 PCP: Cassandria Anger, MD      Brief Narrative:    Medical records reviewed and are as summarized below:  Anthony Gould is a 87 y.o. male  male with medical history significant of mitral regurgitation with a history of Medtronic-Hall mitral valve replacement in 1986, permanent atrial fibrillation, pulmonary hypertension, NSVT, history of ventricular ectopy, history of TIA, syncopal episode, hypertension, type II DM, stage IIIa CKD, elevated TSH, hypertension, hyperlipidemia.  He presented to the hospital because of abdominal pain, vomiting, loose stools, decreased appetite and malaise.  He was found to have acute appendicitis.  He was treated with empiric IV Zosyn, IV fluids and analgesics.      Assessment/Plan:   Principal Problem:   Acute appendicitis Active Problems:   DM2 (diabetes mellitus, type 2) ([place])   Hyperlipidemia   Essential hypertension   Coronary atherosclerosis   Atrial fibrillation (HCC)   MITRAL VALVE REPLACEMENT, HX OF   Chronic diastolic heart failure (HCC)   CRI (chronic renal insufficiency), stage 3 (moderate) (HCC)    Acute appendicitis: He is NPO.  Continue IV Zosyn and IV fluids.  Analgesics as needed for pain.  Follow-up with general surgeon for further recommendations.  S/p mechanical mitral valve replacement, permanent atrial fibrillation: INR was 3.3.  Patient was given 1 dose of IV vitamin K for Coumadin reversal in anticipation of surgery for appendicitis.  Plan to start IV heparin when INR is less than 2.5 per cardiologist recommendation.  Hyponatremia: Sodium levels trending down.  Continue IV fluids and monitor sodium level.  Other comorbidities including chronic diastolic CHF, CAD, compensated liver cirrhosis, history of TIA, CKD stage IIIa   Diet Order             Diet NPO time specified  Diet effective now                       Consultants: Cardiologist General surgeon  Procedures: None    Medications:    furosemide  40 mg Intravenous BID   metoprolol tartrate  5 mg Intravenous Q8H   Continuous Infusions:  lactated ringers 75 mL/hr at 12/03/20 0542   phytonadione (VITAMIN K) IV     piperacillin-tazobactam 3.375 g (12/03/20 1305)     Anti-infectives (From admission, onward)    Start     Dose/Rate Route Frequency Ordered Stop   12/03/20 0200  piperacillin-tazobactam (ZOSYN) IVPB 3.375 g        3.375 g 12.5 mL/hr over 240 Minutes Intravenous Every 8 hours 12/03/20 0121     12/03/20 0100  piperacillin-tazobactam (ZOSYN) IVPB 3.375 g  Status:  Discontinued        3.375 g 100 mL/hr over 30 Minutes Intravenous  Once 12/03/20 0047 12/03/20 0121              Family Communication/Anticipated D/C date and plan/Code Status   DVT prophylaxis:      Code Status: Full Code  Family Communication: None Disposition Plan:    Status is: Inpatient  Remains inpatient appropriate because:IV treatments appropriate due to intensity of illness or inability to take PO and Inpatient level of care appropriate due to severity of illness  Dispo: The patient is from: Home              Anticipated d/c is to: Home  Patient currently is not medically stable to d/c.   Difficult to place patient No           Subjective:   C/o right-sided abdominal pain.  No nausea, vomiting or diarrhea.   Objective:    Vitals:   12/03/20 1300 12/03/20 1400 12/03/20 1441 12/03/20 1501  BP: 117/66 114/69  (!) 136/58  Pulse: 77 78  84  Resp: '18 16  18  '$ Temp: 98.3 F (36.8 C)  98.6 F (37 C) 98.1 F (36.7 C)  TempSrc: Oral  Oral Oral  SpO2: 99% 96%  99%   No data found.  No intake or output data in the 24 hours ending 12/03/20 1526 There were no vitals filed for this visit.  Exam:  GEN: NAD SKIN: Warm and dry EYES: EOMI ENT: MMM CV: RRR PULM: CTA B ABD: soft, ND, RLQ  tenderness, +BS CNS: AAO x 3, non focal EXT: No edema or tenderness        Data Reviewed:   I have personally reviewed following labs and imaging studies:  Labs: Labs show the following:   Basic Metabolic Panel: Recent Labs  Lab 12/02/20 1143 12/03/20 0147  NA 132* 129*  K 4.3 3.8  CL 98 98  CO2 25 21*  GLUCOSE 116* 119*  BUN 18 17  CREATININE 1.30 1.26*  CALCIUM 9.3 9.0   GFR Estimated Creatinine Clearance: 44.3 mL/min (A) (by C-G formula based on SCr of 1.26 mg/dL (H)). Liver Function Tests: Recent Labs  Lab 12/02/20 1143 12/03/20 0147  AST 28 37  ALT 18 27  ALKPHOS 77 78  BILITOT 2.0* 1.9*  PROT 6.8 6.6  ALBUMIN 3.9 3.6   No results for input(s): LIPASE, AMYLASE in the last 168 hours. No results for input(s): AMMONIA in the last 168 hours. Coagulation profile Recent Labs  Lab 12/02/20 1415 12/03/20 0147  INR 3.3* 3.3*    CBC: Recent Labs  Lab 12/02/20 1143 12/03/20 0147  WBC 11.9* 12.6*  HGB 11.9* 12.1*  HCT 35.7* 35.7*  MCV 100.2* 98.6  PLT 203.0 180   Cardiac Enzymes: No results for input(s): CKTOTAL, CKMB, CKMBINDEX, TROPONINI in the last 168 hours. BNP (last 3 results) No results for input(s): PROBNP in the last 8760 hours. CBG: No results for input(s): GLUCAP in the last 168 hours. D-Dimer: No results for input(s): DDIMER in the last 72 hours. Hgb A1c: Recent Labs    12/03/20 1000  HGBA1C 5.3   Lipid Profile: No results for input(s): CHOL, HDL, LDLCALC, TRIG, CHOLHDL, LDLDIRECT in the last 72 hours. Thyroid function studies: No results for input(s): TSH, T4TOTAL, T3FREE, THYROIDAB in the last 72 hours.  Invalid input(s): FREET3 Anemia work up: No results for input(s): VITAMINB12, FOLATE, FERRITIN, TIBC, IRON, RETICCTPCT in the last 72 hours. Sepsis Labs: Recent Labs  Lab 12/02/20 1143 12/03/20 0147  WBC 11.9* 12.6*    Microbiology Recent Results (from the past 240 hour(s))  Resp Panel by RT-PCR (Flu A&B, Covid)  Nasopharyngeal Swab     Status: None   Collection Time: 12/02/20  7:15 PM   Specimen: Nasopharyngeal Swab; Nasopharyngeal(NP) swabs in vial transport medium  Result Value Ref Range Status   SARS Coronavirus 2 by RT PCR NEGATIVE NEGATIVE Final    Comment: (NOTE) SARS-CoV-2 target nucleic acids are NOT DETECTED.  The SARS-CoV-2 RNA is generally detectable in upper respiratory specimens during the acute phase of infection. The lowest concentration of SARS-CoV-2 viral copies this assay can detect is 138 copies/mL.  A negative result does not preclude SARS-Cov-2 infection and should not be used as the sole basis for treatment or other patient management decisions. A negative result may occur with  improper specimen collection/handling, submission of specimen other than nasopharyngeal swab, presence of viral mutation(s) within the areas targeted by this assay, and inadequate number of viral copies(<138 copies/mL). A negative result must be combined with clinical observations, patient history, and epidemiological information. The expected result is Negative.  Fact Sheet for Patients:  EntrepreneurPulse.com.au  Fact Sheet for Healthcare Providers:  IncredibleEmployment.be  This test is no t yet approved or cleared by the [place] FDA and  has been authorized for detection and/or diagnosis of SARS-CoV-2 by FDA under an Emergency Use Authorization (EUA). This EUA will remain  in effect (meaning this test can be used) for the duration of the COVID-19 declaration under Section 564(b)(1) of the Act, 21 U.S.C.section 360bbb-3(b)(1), unless the authorization is terminated  or revoked sooner.       Influenza A by PCR NEGATIVE NEGATIVE Final   Influenza B by PCR NEGATIVE NEGATIVE Final    Comment: (NOTE) The Xpert Xpress SARS-CoV-2/FLU/RSV plus assay is intended as an aid in the diagnosis of influenza from Nasopharyngeal swab specimens and should not be  used as a sole basis for treatment. Nasal washings and aspirates are unacceptable for Xpert Xpress SARS-CoV-2/FLU/RSV testing.  Fact Sheet for Patients: EntrepreneurPulse.com.au  Fact Sheet for Healthcare Providers: IncredibleEmployment.be  This test is not yet approved or cleared by the [place] FDA and has been authorized for detection and/or diagnosis of SARS-CoV-2 by FDA under an Emergency Use Authorization (EUA). This EUA will remain in effect (meaning this test can be used) for the duration of the COVID-19 declaration under Section 564(b)(1) of the Act, 21 U.S.C. section 360bbb-3(b)(1), unless the authorization is terminated or revoked.  Performed at [facility], [place] [address] [place], [place] [address]     Procedures and diagnostic studies:  CT Abdomen Pelvis Wo Contrast  Addendum Date: 12/02/2020   ADDENDUM REPORT: 12/02/2020 16:30 ADDENDUM: The original report was by Dr. Van Clines. The following addendum is by Dr. Van Clines: These results were called by telephone at the time of interpretation on 12/02/2020 at 4:24 pm to provider Dr. Jeralyn Ruths , who verbally acknowledged these results. Electronically Signed   By: Van Clines M.D.   On: 12/02/2020 16:30   Result Date: 12/02/2020 CLINICAL DATA:  Right lower quadrant abdominal pain. Reduced appetite. Low-grade fever. EXAM: CT ABDOMEN AND PELVIS WITHOUT CONTRAST TECHNIQUE: Multidetector CT imaging of the abdomen and pelvis was performed following the standard protocol without IV contrast. COMPARISON:  09/23/2015 and hepatobiliary ultrasound of 04/26/2020 FINDINGS: Lower chest: Mitral valve prosthesis. Atherosclerotic calcification involving the descending thoracic aorta and right coronary artery. Mild cardiomegaly. Hepatobiliary: Nodular contour the liver compatible with cirrhosis. No noncontrast CT evidence of hepatic mass. Cholecystectomy. No biliary dilatation  identified. Pancreas: Unremarkable Spleen: Unremarkable Adrenals/Urinary Tract: Stable scarring of the right kidney upper pole. No hydronephrosis or renal calculi identified. The adrenal glands appear unremarkable. Urinary bladder unremarkable. Stomach/Bowel: Acute appendicitis is present, with an appendicolith along the appendiceal orifice; appendiceal diameter 1.3 cm, and periappendiceal inflammatory stranding. No abnormal extraluminal gas or abscess. Vascular/Lymphatic: Atherosclerosis is present, including aortoiliac atherosclerotic disease. Reproductive: Unremarkable Other: No supplemental non-categorized findings. Musculoskeletal: Mild lumbar spondylosis. IMPRESSION: 1. Acute appendicitis, appendiceal diameter of up to 1.3 cm; appendicolith the appendiceal orifice; and Gould appendiceal inflammatory stranding. No extraluminal gas or abscess  is currently identified. 2. Systemic and aortic Atherosclerosis (ICD10-I70.0). Coronary atherosclerosis. 3. Hepatic cirrhosis. Radiology assistant personnel have been notified to put me in telephone contact with the referring physician or the referring physician's clinical representative in order to discuss these findings. Once this communication is established I will issue an addendum to this report for documentation purposes. Electronically Signed: By: Van Clines M.D. On: 12/02/2020 16:13               LOS: 0 days   Smita Lesh  [facility]   Pager on [email or web address]. If 7PM-7AM, please contact night-coverage at [email or web address]     12/03/2020, 3:26 PM

## 2020-12-03 NOTE — Progress Notes (Signed)
ANTICOAGULATION CONSULT NOTE - Initial Consult  Pharmacy Consult for Heparin Indication: A.Fib  Allergies  Allergen Reactions   Cardizem [Diltiazem]     Bradycardia per pt Doing well on Amlodipine   Sulfonamide Derivatives     Unknown     Patient Measurements:   Heparin Dosing Weight: 76.5  Vital Signs: Temp: 98.1 F (36.7 C) (09/10 1501) Temp Source: Oral (09/10 1501) BP: 136/58 (09/10 1501) Pulse Rate: 84 (09/10 1501)  Labs: Recent Labs    12/02/20 1143 12/02/20 1415 12/03/20 0147  HGB 11.9*  --  12.1*  HCT 35.7*  --  35.7*  PLT 203.0  --  180  LABPROT  --   --  33.8*  INR  --  3.3* 3.3*  CREATININE 1.30  --  1.26*    Estimated Creatinine Clearance: 44.3 mL/min (A) (by C-G formula based on SCr of 1.26 mg/dL (H)).   Medical History: 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   NSVT (nonsustained ventricular tachycardia) (HCC)    Peripheral edema    venous insufficiency   Permanent atrial fibrillation (HCC)    Pulmonary hypertension (HCC)    Syncope    TIA (transient ischemic attack)    while on coumadin   Type II or unspecified type diabetes mellitus without mention of complication, not stated as uncontrolled    lifestyle mangement   Ventricular ectopy    symptomatic    Medications:  Scheduled:   furosemide  40 mg Intravenous BID   metoprolol tartrate  5 mg Intravenous Q8H    Assessment: 87 year old male with a history of  mechanical mitral valve and atrial fibrillation. On coumadin PTA, seen on 9/40 for preop evaluation for acute appendicitis. INR today is 3.3. Vit K '2mg'$  x1 dose given.   Goal of Therapy:  Heparin level 0.3-0.7 units/ml aPTT 66-102 seconds Monitor platelets by anticoagulation protocol: Yes   Plan:  Start  Heparin when INR is <2.5 per MD Will f/u with repeat INR and start heparin when appropriate  Lestine Box, PharmD PGY2 Infectious Diseases Pharmacy Resident   Please check [email or web address] for unit-specific pharmacy phone numbers

## 2020-12-03 NOTE — ED Provider Notes (Signed)
[facility] EMERGENCY DEPARTMENT Provider Note   CSN: [id number] Arrival date & time: 12/02/20  1730     History Chief Complaint  Patient presents with   Abdominal Pain    Anthony Gould is a 87 y.o. male.  Patient with past medical history of diabetes and mechanical mitral valve replacement presents today for appendicitis.  Patient went to primary care this morning for abdominal pain since Tuesday, who sent him for CT scan which showed appendicitis.  Patient states that pain originated in the left lower quadrant but has now moved to the right lower quadrant.  It is is dull in nature, denies any fevers, chills, chest pain, shortness of breath, nausea, vomiting, or diarrhea.  Patient is on Coumadin for history of mitral valve replacement, last took same this morning.  The history is provided by the patient. No language interpreter was used.  Abdominal Pain Associated symptoms: no chest pain, no chills, no constipation, no cough, no diarrhea, no fever, no nausea, no shortness of breath and no vomiting       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   Normal nuclear stress test 12/25/01   no ischemia   NSVT (nonsustained ventricular tachycardia) (HCC)    Peripheral edema    venous insufficiency   Permanent atrial fibrillation ([place])    Pulmonary hypertension ([place])    Syncope    TIA (transient ischemic attack)    while on coumadin   Type II or unspecified type diabetes mellitus without mention of complication, not stated as uncontrolled    lifestyle mangement   Ventricular ectopy    symptomatic    Patient Active Problem List   Diagnosis Date Noted   Insomnia 09/27/2020   Grief at loss of child 09/01/2020    Right hip pain 08/10/2020   Elevated TSH 07/07/2020   CRI (chronic renal insufficiency), stage 3 (moderate) ([place]) 04/08/2020   Neoplasm of uncertain behavior of skin 10/01/2019   Hypomagnesemia 04/02/2019   Syncope 03/05/2019   Abdominal pain 01/20/2019   Vitamin D deficiency 11/28/2018   Low back pain 11/26/2018   Skin cancer 11/26/2018   Melanoma in situ ([place]) 08/26/2018   Foot lesion 04/23/2018   Hematuria 12/18/2017   Night sweats 12/18/2017   Chronic diastolic heart failure ([place]) 09/16/2016   Food allergy 02/13/2016   Cirrhosis of liver without ascites ([place]) 10/12/2015   Nausea with vomiting 10/12/2015   Diarrhea 10/03/2015   Emesis 09/20/2015   Aspiration pneumonia ([place]) 09/03/2015   Food poisoning 09/02/2015   Hematemesis 09/02/2015   Cough 09/02/2015   Flank pain, acute 05/02/2015   Actinic keratoses 10/20/2014   NSVT (nonsustained ventricular tachycardia) ([place]) 02/16/2013   Routine health maintenance 02/06/2012   Paroxysmal atrial fibrillation ([place]) 09/06/2011   Long term (current) use of anticoagulants 07/04/2010   Gout 06/27/2010   Mitral valve disorder 06/06/2010   Chest pain 02/28/2010   TOXIC LABYRINTHITIS 08/04/2009   Hearing loss 07/07/2009   DYSPEPSIA, CHRONIC 06/23/2007   DM2 (diabetes mellitus, type 2) ([place]) 02/08/2007   Hyperlipidemia 01/31/2007   Essential hypertension 01/31/2007   Coronary atherosclerosis 01/31/2007   Atrial fibrillation ([place]) 01/31/2007   Cerebral artery occlusion  with cerebral infarction ([place]) 01/31/2007   Transient cerebral ischemia 01/31/2007   MITRAL VALVE REPLACEMENT, HX OF 01/31/2007    Past Surgical History:  Procedure Laterality Date   CHOLECYSTECTOMY, LAPAROSCOPIC  2003   MITRAL VALVE REPLACEMENT     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       Family History  Problem Relation Age of Onset   Coronary artery disease Father    Diabetes Other    Coronary artery disease Other    Prostate cancer Neg Hx     Colon cancer Neg Hx     Social History   Tobacco Use   Smoking status: Never   Smokeless tobacco: Never  Substance Use Topics   Alcohol use: No    Alcohol/week: 0.0 standard drinks   Drug use: No    Home Medications Prior to Admission medications   Medication Sig Start Date End Date Taking? Authorizing Provider  allopurinol (ZYLOPRIM) 300 MG tablet TAKE 1 TABLET BY MOUTH DAILY AS NEEDED. GENERIC EQUIVALENT FOR ZYLOPRIM 04/14/20   Plotnikov, Evie Lacks, MD  amLODipine (NORVASC) 5 MG tablet TAKE 1/2 TABLET BY MOUTH EVERY DAY FOR 3 DAYS,IF BLOOD PRESSURE IS STILL HIGHER THAN 130 THEN INCREASE TO 1 TABLET BY MOUTH DAILY 03/28/20   Charlie Pitter, [name]  aspirin 81 MG EC tablet Take 81 mg by mouth daily.    [provider]  atorvastatin (LIPITOR) 20 MG tablet TAKE 1 TABLET BY MOUTH TWO TIMES A WEEK 04/14/20   Plotnikov, Evie Lacks, MD  bisoprolol (ZEBETA) 5 MG tablet Take 0.5 tablets (2.5 mg total) by mouth daily. 12/08/19   Dunn, Nedra Hai, [name]  Cholecalciferol (VITAMIN D3) 50 MCG (2000 UT) capsule Take 1 capsule (2,000 Units total) by mouth daily. 10/01/19   Plotnikov, Evie Lacks, MD  dipyridamole (PERSANTINE) 50 MG tablet Take 1 tablet (50 mg total) by mouth 3 (three) times daily. 08/08/20   Burnell Blanks, MD  enalapril (VASOTEC) 20 MG tablet TAKE 1 TABLET BY MOUTH DAILY. 08/07/20   Plotnikov, Evie Lacks, MD  escitalopram (LEXAPRO) 5 MG tablet Take 1 tablet (5 mg total) by mouth at bedtime. 09/27/20   Plotnikov, Evie Lacks, MD  furosemide (LASIX) 40 MG tablet TAKE 1 TABLET BY MOUTH DAILY GENERIC EQUIVALENT FOR LASIX 07/04/20   Plotnikov, Evie Lacks, MD  HYDROcodone-acetaminophen (NORCO) 7.5-325 MG tablet Take 0.5-1 tablets by mouth every 6 (six) hours as needed for moderate pain. 09/27/20   Plotnikov, Evie Lacks, MD  latanoprost (XALATAN) 0.005 % ophthalmic solution Place 1 drop into both eyes at bedtime.    [provider]  magnesium oxide (MAG-OX) 400 MG tablet TAKE 1 TABLET(400  MG) BY MOUTH TWICE DAILY 04/14/20   Dunn, [name] N, [name]  Milk Thistle 1000 MG CAPS Take 1 capsule by mouth daily.    [provider]  pantoprazole (PROTONIX) 40 MG tablet TAKE 1 TABLET BY MOUTH DAILY 08/07/20   Plotnikov, Evie Lacks, MD  warfarin (COUMADIN) 5 MG tablet TAKE 1 TABLET BY MOUTH AS DIRECTED BY THE COUMADIN CLINIC 06/27/20   Burnell Blanks, MD    Allergies    Cardizem [diltiazem] and Sulfonamide derivatives  Review of Systems   Review of Systems  Constitutional:  Negative for chills and fever.  HENT:  Negative for congestion, postnasal drip and rhinorrhea.   Respiratory:  Negative for cough, chest tightness and shortness of breath.   Cardiovascular:  Negative for chest pain and palpitations.  Gastrointestinal:  Positive for abdominal pain.  Negative for abdominal distention, blood in stool, constipation, diarrhea, nausea and vomiting.  Skin:  Negative for color change and wound.  Psychiatric/Behavioral:  Negative for confusion.   All other systems reviewed and are negative.  Physical Exam Updated Vital Signs BP 127/72 (BP Location: Left Arm)   Pulse 97   Temp 98.9 F (37.2 C) (Oral)   Resp 17   SpO2 100%   Physical Exam Vitals and nursing note reviewed.  Constitutional:      General: He is not in acute distress.    Appearance: He is well-developed and normal weight. He is not ill-appearing, toxic-appearing or diaphoretic.  HENT:     Head: Normocephalic.  Cardiovascular:     Rate and Rhythm: Normal rate.     Heart sounds: Normal heart sounds.  Pulmonary:     Effort: Pulmonary effort is normal.     Breath sounds: Normal breath sounds.  Abdominal:     General: Abdomen is flat. Bowel sounds are increased.     Palpations: Abdomen is soft.     Tenderness: There is abdominal tenderness in the right lower quadrant. There is no guarding or rebound. Positive signs include Rovsing's sign and McBurney's sign. Negative signs include Murphy's sign, psoas sign  and obturator sign.  Skin:    General: Skin is warm and dry.  Neurological:     General: No focal deficit present.     Mental Status: He is alert.  Psychiatric:        Mood and Affect: Mood normal.        Behavior: Behavior normal.    ED Results / Procedures / Treatments   Labs (all labs ordered are listed, but only abnormal results are displayed) Labs Reviewed  RESP PANEL BY RT-PCR (FLU A&B, COVID) [id number]    EKG None  Radiology CT Abdomen Pelvis Wo Contrast  Addendum Date: 12/02/2020   ADDENDUM REPORT: 12/02/2020 16:30 ADDENDUM: The original report was by Dr. Van Clines. The following addendum is by Dr. Van Clines: These results were called by telephone at the time of interpretation on 12/02/2020 at 4:24 pm to provider Dr. Jeralyn Ruths , who verbally acknowledged these results. Electronically Signed   By: Van Clines M.D.   On: 12/02/2020 16:30   Result Date: 12/02/2020 CLINICAL DATA:  Right lower quadrant abdominal pain. Reduced appetite. Low-grade fever. EXAM: CT ABDOMEN AND PELVIS WITHOUT CONTRAST TECHNIQUE: Multidetector CT imaging of the abdomen and pelvis was performed following the standard protocol without IV contrast. COMPARISON:  09/23/2015 and hepatobiliary ultrasound of 04/26/2020 FINDINGS: Lower chest: Mitral valve prosthesis. Atherosclerotic calcification involving the descending thoracic aorta and right coronary artery. Mild cardiomegaly. Hepatobiliary: Nodular contour the liver compatible with cirrhosis. No noncontrast CT evidence of hepatic mass. Cholecystectomy. No biliary dilatation identified. Pancreas: Unremarkable Spleen: Unremarkable Adrenals/Urinary Tract: Stable scarring of the right kidney upper pole. No hydronephrosis or renal calculi identified. The adrenal glands appear unremarkable. Urinary bladder unremarkable. Stomach/Bowel: Acute appendicitis is present, with an appendicolith along the appendiceal orifice; appendiceal diameter 1.3 cm, and  periappendiceal inflammatory stranding. No abnormal extraluminal gas or abscess. Vascular/Lymphatic: Atherosclerosis is present, including aortoiliac atherosclerotic disease. Reproductive: Unremarkable Other: No supplemental non-categorized findings. Musculoskeletal: Mild lumbar spondylosis. IMPRESSION: 1. Acute appendicitis, appendiceal diameter of up to 1.3 cm; appendicolith the appendiceal orifice; and Peri appendiceal inflammatory stranding. No extraluminal gas or abscess is currently identified. 2. Systemic and aortic Atherosclerosis (ICD10-I70.0). Coronary atherosclerosis. 3. Hepatic cirrhosis. Radiology assistant personnel have been notified to put me in telephone contact  with the referring physician or the referring physician's clinical representative in order to discuss these findings. Once this communication is established I will issue an addendum to this report for documentation purposes. Electronically Signed: By: Van Clines M.D. On: 12/02/2020 16:13    Procedures Procedures   Medications Ordered in ED Medications - No data to display  ED Course  I have reviewed the triage vital signs and the nursing notes.  Pertinent labs & imaging results that were available during my care of the patient were reviewed by me and considered in my medical decision making (see chart for details).    MDM Rules/Calculators/A&P                         Patient presents to the ED with complaints of abdominal pain, appendicitis on outpatient CT. Nontoxic, vitals unremarkable.    Additional history obtained:  Additional history obtained from chart review & nursing note review.   Lab Tests:  I reviewed, and interpreted labs, which included:  Labs ordered outpatient, remarkable for WBC count of 11.4, INR therapeutic at 3.3   Imaging Studies ordered:  Imaging studies ordered outpatient which included CT abdomen pelvis without contrast, I independently reviewed, formal radiology impression shows:   Acute appendicitis with diameter of 1.3 cm  ED Course:  Patient presents with confirmed appendicitis on CT scan. Has been having persistent abdominal pain for 3 days.  Denies fevers, chills, nausea, vomiting, diarrhea.  Patient is afebrile, non-toxic appearing, in no acute distress.  Some mild rebound tenderness in the right lower quadrant noted on exam.  Patient is stable at this time no concern for appendiceal perforation.  Discussed findings with general surgery who stated due to comorbidities and current anticoagulation status, they will follow him on the floor.  Patient initiated on broad-spectrum antibiotics for same.  Plan is to admit to hospitalist for management.  Patient expresses understanding, questions were answered, he is amenable with this plan.   This is a shared visit with supervising physician Dr. [name] Barker who has independently evaluated patient & provided guidance in evaluation/management/disposition, in agreement with care    Portions of this note were generated with Dragon dictation software. Dictation errors may occur despite best attempts at proofreading.    Final Clinical Impression(s) / ED Diagnoses Final diagnoses:  Acute appendicitis with localized peritonitis, without perforation, abscess, or gangrene    Rx / DC Orders ED Discharge Orders     None        Nestor Lewandowsky 12/03/20 0217    Mesner, Corene Cornea, MD 12/03/20 BP:[id number]    Merrily Pew, MD 12/05/20 0050

## 2020-12-03 NOTE — Consult Note (Signed)
CC: Right sided abdominal pain  Requesting provider:  Lavonna Rua [facility]   HPI: Anthony Gould is an 87 y.o. male with hx of HTN, HLD, DM, afib, CKD, mechanical mitral valve (on warfarin), cirrhosis (noted on RUQ [place] 04/26/20) - who presented to the emergency department today for evaluation of abdominal pain.  He reported abdominal pain that began a day or 2 ago in his mid to left side of his abdomen that is subsequently localized to the right side.  This does not radiate.  Nothing seems to make it better or worse.  He describes the pain as dull/crampy.  He denies any fever or chills.  He denies any chest pain or shortness of breath.  He denies any nausea or vomiting.   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   Normal nuclear stress test 12/25/01   no ischemia   NSVT (nonsustained ventricular tachycardia) (HCC)    Peripheral edema    venous insufficiency   Permanent atrial fibrillation (HCC)    Pulmonary hypertension (HCC)    Syncope    TIA (transient ischemic attack)    while on coumadin   Type II or unspecified type diabetes mellitus without mention of complication, not stated as uncontrolled    lifestyle mangement   Ventricular ectopy    symptomatic    Past Surgical History:  Procedure Laterality Date   CHOLECYSTECTOMY, LAPAROSCOPIC  2003   MITRAL VALVE REPLACEMENT     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    Family History  Problem Relation Age of Onset   Coronary artery disease Father    Diabetes Other    Coronary artery disease Other    Prostate cancer Neg Hx    Colon cancer Neg Hx     Social:  reports that he has never smoked. He has never used smokeless tobacco. He reports that he does not drink  alcohol and does not use drugs.  Allergies:  Allergies  Allergen Reactions   Cardizem [Diltiazem]     Bradycardia per pt Doing well on Amlodipine   Sulfonamide Derivatives     Unknown     Medications: I have reviewed the patient's current medications.  Results for orders placed or performed during the hospital encounter of 12/02/20 (from the past 48 hour(s))  Resp Panel by RT-PCR (Flu A&B, Covid) Nasopharyngeal Swab     Status: None   Collection Time: 12/02/20  7:15 PM   Specimen: Nasopharyngeal Swab; Nasopharyngeal(NP) swabs in vial transport medium  Result Value Ref Range   SARS Coronavirus 2 by RT PCR NEGATIVE NEGATIVE    Comment: (NOTE) SARS-CoV-2 target nucleic acids are NOT DETECTED.  The SARS-CoV-2 RNA is generally detectable in upper respiratory specimens during the acute phase of infection. The lowest concentration of SARS-CoV-2 viral copies this assay can detect is 138 copies/mL. A negative result does not preclude SARS-Cov-2 infection and should not be used as the sole basis for treatment or other patient management decisions. A negative result may occur with  improper specimen collection/handling, submission of specimen other than nasopharyngeal swab, presence of viral mutation(s) within the areas targeted by this assay, and inadequate number of viral copies(<138 copies/mL).  A negative result must be combined with clinical observations, patient history, and epidemiological information. The expected result is Negative.  Fact Sheet for Patients:  EntrepreneurPulse.com.au  Fact Sheet for Healthcare Providers:  IncredibleEmployment.be  This test is no t yet approved or cleared by the [place] FDA and  has been authorized for detection and/or diagnosis of SARS-CoV-2 by FDA under an Emergency Use Authorization (EUA). This EUA will remain  in effect (meaning this test can be used) for the duration of the COVID-19 declaration  under Section 564(b)(1) of the Act, 21 U.S.C.section 360bbb-3(b)(1), unless the authorization is terminated  or revoked sooner.       Influenza A by PCR NEGATIVE NEGATIVE   Influenza B by PCR NEGATIVE NEGATIVE    Comment: (NOTE) The Xpert Xpress SARS-CoV-2/FLU/RSV plus assay is intended as an aid in the diagnosis of influenza from Nasopharyngeal swab specimens and should not be used as a sole basis for treatment. Nasal washings and aspirates are unacceptable for Xpert Xpress SARS-CoV-2/FLU/RSV testing.  Fact Sheet for Patients: EntrepreneurPulse.com.au  Fact Sheet for Healthcare Providers: IncredibleEmployment.be  This test is not yet approved or cleared by the [place] FDA and has been authorized for detection and/or diagnosis of SARS-CoV-2 by FDA under an Emergency Use Authorization (EUA). This EUA will remain in effect (meaning this test can be used) for the duration of the COVID-19 declaration under Section 564(b)(1) of the Act, 21 U.S.C. section 360bbb-3(b)(1), unless the authorization is terminated or revoked.  Performed at [facility], [place] [address], [place], [place] [address]     CT Abdomen Pelvis Wo Contrast  Addendum Date: 12/02/2020   ADDENDUM REPORT: 12/02/2020 16:30 ADDENDUM: The original report was by Dr. Van Clines. The following addendum is by Dr. Van Clines: These results were called by telephone at the time of interpretation on 12/02/2020 at 4:24 pm to provider Dr. Jeralyn Ruths , who verbally acknowledged these results. Electronically Signed   By: Van Clines M.D.   On: 12/02/2020 16:30   Result Date: 12/02/2020 CLINICAL DATA:  Right lower quadrant abdominal pain. Reduced appetite. Low-grade fever. EXAM: CT ABDOMEN AND PELVIS WITHOUT CONTRAST TECHNIQUE: Multidetector CT imaging of the abdomen and pelvis was performed following the standard protocol without IV contrast. COMPARISON:  09/23/2015 and  hepatobiliary ultrasound of 04/26/2020 FINDINGS: Lower chest: Mitral valve prosthesis. Atherosclerotic calcification involving the descending thoracic aorta and right coronary artery. Mild cardiomegaly. Hepatobiliary: Nodular contour the liver compatible with cirrhosis. No noncontrast CT evidence of hepatic mass. Cholecystectomy. No biliary dilatation identified. Pancreas: Unremarkable Spleen: Unremarkable Adrenals/Urinary Tract: Stable scarring of the right kidney upper pole. No hydronephrosis or renal calculi identified. The adrenal glands appear unremarkable. Urinary bladder unremarkable. Stomach/Bowel: Acute appendicitis is present, with an appendicolith along the appendiceal orifice; appendiceal diameter 1.3 cm, and periappendiceal inflammatory stranding. No abnormal extraluminal gas or abscess. Vascular/Lymphatic: Atherosclerosis is present, including aortoiliac atherosclerotic disease. Reproductive: Unremarkable Other: No supplemental non-categorized findings. Musculoskeletal: Mild lumbar spondylosis. IMPRESSION: 1. Acute appendicitis, appendiceal diameter of up to 1.3 cm; appendicolith the appendiceal orifice; and Peri appendiceal inflammatory stranding. No extraluminal gas or abscess is currently identified. 2. Systemic and aortic Atherosclerosis (ICD10-I70.0). Coronary atherosclerosis. 3. Hepatic cirrhosis. Radiology assistant personnel have been notified to put me in telephone contact with the referring physician or the referring physician's clinical representative in order to discuss these findings. Once this communication is established I will issue an addendum to this report for documentation purposes. Electronically Signed: By: Van Clines M.D. On:  12/02/2020 16:13    ROS - all of the below systems have been reviewed with the patient and positives are indicated with bold text General: chills, fever or night sweats Eyes: blurry vision or double vision ENT: epistaxis or sore  throat Allergy/Immunology: itchy/watery eyes or nasal congestion Hematologic/Lymphatic: bleeding problems, blood clots or swollen lymph nodes Endocrine: temperature intolerance or unexpected weight changes Breast: new or changing breast lumps or nipple discharge Resp: cough, shortness of breath, or wheezing CV: chest pain or dyspnea on exertion GI: as per HPI GU: dysuria, trouble voiding, or hematuria MSK: joint pain or joint stiffness Neuro: TIA or stroke symptoms Derm: pruritus and skin lesion changes Psych: anxiety and depression  PE Blood pressure 127/72, pulse 97, temperature 98.9 F (37.2 C), temperature source Oral, resp. rate 17, SpO2 100 %. Constitutional: NAD; conversant; no deformities Eyes: Moist conjunctiva; no lid lag; anicteric; PERRL Neck: Trachea midline; no thyromegaly Lungs: Normal respiratory effort; no tactile fremitus CV: RRR; no palpable thrills; no pitting edema GI: Abd soft, mildly ttp in RLQ; no rebound nor guarding; no palpable hepatosplenomegaly MSK: Normal range of motion of extremities; no clubbing/cyanosis Psychiatric: Appropriate affect; alert and oriented x3 Lymphatic: No palpable cervical or axillary lymphadenopathy  Results for orders placed or performed during the hospital encounter of 12/02/20 (from the past 48 hour(s))  Resp Panel by RT-PCR (Flu A&B, Covid) Nasopharyngeal Swab     Status: None   Collection Time: 12/02/20  7:15 PM   Specimen: Nasopharyngeal Swab; Nasopharyngeal(NP) swabs in vial transport medium  Result Value Ref Range   SARS Coronavirus 2 by RT PCR NEGATIVE NEGATIVE    Comment: (NOTE) SARS-CoV-2 target nucleic acids are NOT DETECTED.  The SARS-CoV-2 RNA is generally detectable in upper respiratory specimens during the acute phase of infection. The lowest concentration of SARS-CoV-2 viral copies this assay can detect is 138 copies/mL. A negative result does not preclude SARS-Cov-2 infection and should not be used as the  sole basis for treatment or other patient management decisions. A negative result may occur with  improper specimen collection/handling, submission of specimen other than nasopharyngeal swab, presence of viral mutation(s) within the areas targeted by this assay, and inadequate number of viral copies(<138 copies/mL). A negative result must be combined with clinical observations, patient history, and epidemiological information. The expected result is Negative.  Fact Sheet for Patients:  EntrepreneurPulse.com.au  Fact Sheet for Healthcare Providers:  IncredibleEmployment.be  This test is no t yet approved or cleared by the [place] FDA and  has been authorized for detection and/or diagnosis of SARS-CoV-2 by FDA under an Emergency Use Authorization (EUA). This EUA will remain  in effect (meaning this test can be used) for the duration of the COVID-19 declaration under Section 564(b)(1) of the Act, 21 U.S.C.section 360bbb-3(b)(1), unless the authorization is terminated  or revoked sooner.       Influenza A by PCR NEGATIVE NEGATIVE   Influenza B by PCR NEGATIVE NEGATIVE    Comment: (NOTE) The Xpert Xpress SARS-CoV-2/FLU/RSV plus assay is intended as an aid in the diagnosis of influenza from Nasopharyngeal swab specimens and should not be used as a sole basis for treatment. Nasal washings and aspirates are unacceptable for Xpert Xpress SARS-CoV-2/FLU/RSV testing.  Fact Sheet for Patients: EntrepreneurPulse.com.au  Fact Sheet for Healthcare Providers: IncredibleEmployment.be  This test is not yet approved or cleared by the [place] FDA and has been authorized for detection and/or diagnosis of SARS-CoV-2 by FDA under an Emergency Use Authorization (EUA). This  EUA will remain in effect (meaning this test can be used) for the duration of the COVID-19 declaration under Section 564(b)(1) of the Act, 21  U.S.C. section 360bbb-3(b)(1), unless the authorization is terminated or revoked.  Performed at [facility], [place] [address], [place], [place] [address]     CT Abdomen Pelvis Wo Contrast  Addendum Date: 12/02/2020   ADDENDUM REPORT: 12/02/2020 16:30 ADDENDUM: The original report was by Dr. Van Clines. The following addendum is by Dr. Van Clines: These results were called by telephone at the time of interpretation on 12/02/2020 at 4:24 pm to provider Dr. Jeralyn Ruths , who verbally acknowledged these results. Electronically Signed   By: Van Clines M.D.   On: 12/02/2020 16:30   Result Date: 12/02/2020 CLINICAL DATA:  Right lower quadrant abdominal pain. Reduced appetite. Low-grade fever. EXAM: CT ABDOMEN AND PELVIS WITHOUT CONTRAST TECHNIQUE: Multidetector CT imaging of the abdomen and pelvis was performed following the standard protocol without IV contrast. COMPARISON:  09/23/2015 and hepatobiliary ultrasound of 04/26/2020 FINDINGS: Lower chest: Mitral valve prosthesis. Atherosclerotic calcification involving the descending thoracic aorta and right coronary artery. Mild cardiomegaly. Hepatobiliary: Nodular contour the liver compatible with cirrhosis. No noncontrast CT evidence of hepatic mass. Cholecystectomy. No biliary dilatation identified. Pancreas: Unremarkable Spleen: Unremarkable Adrenals/Urinary Tract: Stable scarring of the right kidney upper pole. No hydronephrosis or renal calculi identified. The adrenal glands appear unremarkable. Urinary bladder unremarkable. Stomach/Bowel: Acute appendicitis is present, with an appendicolith along the appendiceal orifice; appendiceal diameter 1.3 cm, and periappendiceal inflammatory stranding. No abnormal extraluminal gas or abscess. Vascular/Lymphatic: Atherosclerosis is present, including aortoiliac atherosclerotic disease. Reproductive: Unremarkable Other: No supplemental non-categorized findings. Musculoskeletal: Mild lumbar  spondylosis. IMPRESSION: 1. Acute appendicitis, appendiceal diameter of up to 1.3 cm; appendicolith the appendiceal orifice; and Peri appendiceal inflammatory stranding. No extraluminal gas or abscess is currently identified. 2. Systemic and aortic Atherosclerosis (ICD10-I70.0). Coronary atherosclerosis. 3. Hepatic cirrhosis. Radiology assistant personnel have been notified to put me in telephone contact with the referring physician or the referring physician's clinical representative in order to discuss these findings. Once this communication is established I will issue an addendum to this report for documentation purposes. Electronically Signed: By: Van Clines M.D. On: 12/02/2020 16:13     A/P: Anthony Gould is an 87 y.o. male with hx HTN, HLD, DM, afib, CKD, mechanical mitral valve (on warfarin), cirrhosis (noted on RUQ [place] 04/26/20) acute appendicitis  -Agree with admission to medicine; ok for heparin if needed but would plan to hold warfarin - cardiology consult for risk stratification and clearance to come off blood thinner -IV Zosyn -We will follow with you  Nadeen Landau, MD [facility] Use [email or web address] to contact on call provider

## 2020-12-03 NOTE — ED Notes (Signed)
Patient resting in stretcher comfortably. Eyes closed, Equal chest rise and fall. Patient alert to verbal stimuli. Call bell in reach, Stretcher in low and locked position. Side rails up x2.   

## 2020-12-03 NOTE — ED Notes (Signed)
Patient changed into gown at this time. Son to take home [place].

## 2020-12-03 NOTE — Consult Note (Addendum)
Cardiology Consultation:   Patient ID: XAINE Gould MRN: [id number]; DOB: September 01, 1933  Admit date: 12/02/2020 Date of Consult: 12/03/2020  PCP:  Cassandria Anger, MD   [facility] Providers Cardiologist:  Lauree Chandler, MD        Patient Profile:   Anthony Gould is a 87 y.o. male with a hx of  severe mitral regurgitation s/p Medronic Hall MVR in 1986, permanent atrial fibrillation, pulmonary HTN with elevated PASP, HTN, HLD (followed by PCP), venous insufficiency, PVCs, CKD stage III by labs, cirrhotic changes of the liver by imaging (followed by PCP), former ETOH use, DM and prior TIA on coumadin, who is being seen 12/03/2020 for the preop evaluation of acute appendicitis at the request of Dr Mal Misty.  History of Present Illness:   Mr. Havel was doing well until he started getting hip pain. After an injection, that pain has improved.   He has a 2-story home and climbs the stairs regularly. He is not SOB at the top, but can get a little tired.   He does housework and goes to the grocery store. He used to play golf, but thinks his knees and his balance make it unsafe.  He feels he can easily walk 1 or 2 blocks without stopping.  Musculoskeletal issues generally limit his activity level now.  He never gets chest pain.    He feels the atrial fib, but says it never goes fast. He takes coumadin daily.  He has not had problems with presyncope or syncope.  Until his abdomen started hurting, he was doing well.   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   Normal nuclear stress test 12/25/01   no ischemia   NSVT (nonsustained ventricular tachycardia) (HCC)    Peripheral edema    venous insufficiency   Permanent  atrial fibrillation (HCC)    Pulmonary hypertension (HCC)    Syncope    TIA (transient ischemic attack)    while on coumadin   Type II or unspecified type diabetes mellitus without mention of complication, not stated as uncontrolled    lifestyle mangement   Ventricular ectopy    symptomatic    Past Surgical History:  Procedure Laterality Date   CHOLECYSTECTOMY, LAPAROSCOPIC  2003   MITRAL VALVE REPLACEMENT     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     Home Medications:  Prior to Admission medications   Medication Sig Start Date End Date Taking? Authorizing Provider  allopurinol (ZYLOPRIM) 300 MG tablet TAKE 1 TABLET BY MOUTH DAILY AS NEEDED. GENERIC EQUIVALENT FOR ZYLOPRIM 04/14/20   Plotnikov, Evie Lacks, MD  amLODipine (NORVASC) 5 MG tablet TAKE 1/2 TABLET BY MOUTH EVERY DAY FOR 3 DAYS,IF BLOOD PRESSURE IS STILL HIGHER THAN 130 THEN INCREASE TO 1 TABLET BY MOUTH DAILY 03/28/20   Charlie Pitter, [name]  aspirin 81 MG EC tablet Take 81 mg by mouth daily.    [provider]  atorvastatin (LIPITOR) 20 MG tablet TAKE 1 TABLET BY MOUTH TWO TIMES A WEEK 04/14/20   Plotnikov, Evie Lacks, MD  bisoprolol (ZEBETA) 5 MG tablet Take 0.5 tablets (2.5 mg total) by mouth daily. 12/08/19   Charlie Pitter, [name]  Cholecalciferol (VITAMIN D3) 50 MCG (2000 UT) capsule Take 1 capsule (2,000 Units total) by mouth daily. 10/01/19   Plotnikov, Evie Lacks, MD  dipyridamole (PERSANTINE) 50 MG tablet Take 1 tablet (50 mg total) by mouth 3 (three) times daily. 08/08/20   Burnell Blanks, MD  enalapril (VASOTEC) 20 MG tablet TAKE 1 TABLET BY MOUTH DAILY. 08/07/20   Plotnikov, Evie Lacks, MD  escitalopram (LEXAPRO) 5 MG tablet Take 1 tablet (5 mg total) by mouth at bedtime. 09/27/20   Plotnikov, Evie Lacks, MD  furosemide (LASIX) 40 MG tablet TAKE 1 TABLET BY MOUTH DAILY GENERIC EQUIVALENT FOR LASIX 07/04/20   Plotnikov, Evie Lacks, MD  HYDROcodone-acetaminophen (NORCO) 7.5-325 MG tablet Take 0.5-1 tablets by  mouth every 6 (six) hours as needed for moderate pain. 09/27/20   Plotnikov, Evie Lacks, MD  latanoprost (XALATAN) 0.005 % ophthalmic solution Place 1 drop into both eyes at bedtime.    [provider]  magnesium oxide (MAG-OX) 400 MG tablet TAKE 1 TABLET(400 MG) BY MOUTH TWICE DAILY 04/14/20   Dunn, [name] N, [name]  Milk Thistle 1000 MG CAPS Take 1 capsule by mouth daily.    [provider]  pantoprazole (PROTONIX) 40 MG tablet TAKE 1 TABLET BY MOUTH DAILY 08/07/20   Plotnikov, Evie Lacks, MD  warfarin (COUMADIN) 5 MG tablet TAKE 1 TABLET BY MOUTH AS DIRECTED BY THE COUMADIN CLINIC 06/27/20   Burnell Blanks, MD    Inpatient Medications: Scheduled Meds:  furosemide  40 mg Intravenous BID   metoprolol tartrate  5 mg Intravenous Q8H   Continuous Infusions:  lactated ringers 75 mL/hr at 12/03/20 0542   piperacillin-tazobactam Stopped (12/03/20 0543)   PRN Meds: acetaminophen **OR** acetaminophen, HYDROmorphone (DILAUDID) injection, ondansetron **OR** ondansetron (ZOFRAN) IV  Allergies:    Allergies  Allergen Reactions   Cardizem [Diltiazem]     Bradycardia per pt Doing well on Amlodipine   Sulfonamide Derivatives     Unknown     Social History:   Social History   Socioeconomic History   Marital status: Married    Spouse name: Anthony Gould   Number of children: 2   Years of education: Not on file   Highest education level: Not on file  Occupational History   Occupation: Programme researcher, 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  Tobacco Use   Smoking status: Never   Smokeless tobacco: Never  Substance and Sexual Activity   Alcohol use: No    Alcohol/week: 0.0 standard drinks   Drug use: No   Sexual activity: Not on file  Other Topics Concern   Not on file  Social History Narrative   HSG, many management courses   Married '58   2 sons - '61, '62; 1 grandchild   Nonsmoker         Social Determinants of Radio broadcast assistant Strain: Not on Art therapist  Insecurity: Not on file  Transportation Needs: Not on file  Physical Activity: Not on file  Stress: Not on file  Social Connections: Not on file  Intimate Partner Violence: Not on file    Family History:    Family History  Problem Relation Age of Onset   Coronary artery disease Father    Diabetes Other    Coronary artery disease Other    Prostate cancer Neg Hx    Colon cancer Neg Hx      ROS:  Please see the history of present illness.  All other ROS reviewed and negative.  Physical Exam/Data:   Vitals:   12/03/20 0800 12/03/20 0830 12/03/20 0900 12/03/20 0930  BP: 134/63 (!) 123/58 (!) 104/59 (!) 114/57  Pulse: 84 71 69 74  Resp: '16 16 17 '$ (!) 25  Temp:      TempSrc:      SpO2: 100% 97% 97% 97%   No intake or output data in the 24 hours ending 12/03/20 1127 Last 3 Weights 12/02/2020 09/27/2020 09/01/2020  Weight (lbs) 168 lb 9.6 oz 169 lb 172 lb 3.2 oz  Weight (kg) 76.476 kg 76.658 kg 78.109 kg     There is no height or weight on file to calculate BMI.  General:  Well nourished, well developed, in no acute distress HEENT: normal Lymph: no adenopathy Neck: no JVD Endocrine:  No thryomegaly Vascular: No carotid bruits; 4/4 extremity pulses 2+  Cardiac:  normal S1, S2; irregular rate and rhythm; soft murmur, crisp valve click noted Lungs:  clear to auscultation bilaterally, no wheezing, rhonchi or rales  Abd: soft, tender, no hepatomegaly  Ext: no edema Musculoskeletal:  No deformities, BUE and BLE strength normal and equal Skin: warm and dry  Neuro:  CNs 2-12 intact, no focal abnormalities noted Psych:  Normal affect   EKG:  The EKG was personally reviewed and demonstrates: ECG not done today, will order Telemetry:  Telemetry was personally reviewed and demonstrates: Atrial fib, rate generally controlled  Relevant CV Studies:  ECHO: 03/09/2019  1. Left ventricular ejection fraction, by visual estimation, is 55 to  60%. The left ventricle has normal function.  There is moderately increased left ventricular hypertrophy.   2. Left ventricular diastolic function could not be evaluated.   3. The left ventricle has no regional wall motion abnormalities.   4. Global right ventricle has normal systolic function.The right  ventricular size is normal. No increase in right ventricular wall  thickness.   5. Left atrial size was severely dilated.   6. Right atrial size was severely dilated.   7. The mitral valve has been repaired/replaced. Trivial mitral valve  regurgitation.   8. The tricuspid valve is normal in structure. Tricuspid valve  regurgitation moderate.   9. The aortic valve is normal in structure. Aortic valve regurgitation is not visualized. No evidence of aortic valve sclerosis or stenosis.  10. The pulmonic valve was grossly normal. Pulmonic valve regurgitation is trivial.  11. Moderately elevated pulmonary artery systolic pressure.  12. The atrial septum is grossly normal.   MYOVIEW: 02/12/2013 Exercise Capacity: Fair exercise capacity. BP Response: Normal blood pressure response. Clinical Symptoms: No chest pain ECG Impression: No significant ST segment change suggestive of ischemia; patient in atrial fibrillation during the study; frequent PVCs and nonsustained VT. Overall Impression: Abnormal stress nuclear study with no ischemia or infarction; reduced LV function.  Laboratory Data:  High Sensitivity Troponin:  No results for input(s): TROPONINIHS in the last 720 hours.   Chemistry Recent Labs  Lab 12/02/20 1143 12/03/20 0147  NA 132* 129*  K 4.3 3.8  CL 98 98  CO2 25 21*  GLUCOSE 116* 119*  BUN 18 17  CREATININE 1.30 1.26*  CALCIUM 9.3 9.0  GFRNONAA  --  56*  ANIONGAP  --  10    Recent Labs  Lab 12/02/20 1143 12/03/20 0147  PROT 6.8 6.6  ALBUMIN 3.9 3.6  AST 28 37  ALT 18 27  ALKPHOS 77 78  BILITOT 2.0* 1.9*   Hematology Recent Labs  Lab 12/02/20 1143 12/03/20 0147  WBC 11.9* 12.6*  RBC 3.56* 3.62*  HGB  11.9* 12.1*  HCT 35.7* 35.7*  MCV 100.2* 98.6  MCH  --  33.4  MCHC 33.4 33.9  RDW 15.4 13.9  PLT 203.0 180   BNPNo results for input(s): BNP, PROBNP in the last 168 hours.  DDimer No results for input(s): DDIMER in the last 168 hours. Lab Results  Component Value Date   INR 3.3 (H) 12/03/2020   INR 3.3 (A) 12/02/2020   INR 2.1 11/18/2020   PROTIME 22.2 09/06/2008    Radiology/Studies:  CT Abdomen Pelvis Wo Contrast  Addendum Date: 12/02/2020   ADDENDUM REPORT: 12/02/2020 16:30 ADDENDUM: The original report was by Dr. Van Clines. The following addendum is by Dr. Van Clines: These results were called by telephone at the time of interpretation on 12/02/2020 at 4:24 pm to provider Dr. Jeralyn Ruths , who verbally acknowledged these results. Electronically Signed   By: Van Clines M.D.   On: 12/02/2020 16:30   Result Date: 12/02/2020 CLINICAL DATA:  Right lower quadrant abdominal pain. Reduced appetite. Low-grade fever. EXAM: CT ABDOMEN AND PELVIS WITHOUT CONTRAST TECHNIQUE: Multidetector CT imaging of the abdomen and pelvis was performed following the standard protocol without IV contrast. COMPARISON:  09/23/2015 and hepatobiliary ultrasound of 04/26/2020 FINDINGS: Lower chest: Mitral valve prosthesis. Atherosclerotic calcification involving the descending thoracic aorta and right coronary artery. Mild cardiomegaly. Hepatobiliary: Nodular contour the liver compatible with cirrhosis. No noncontrast CT evidence of hepatic mass. Cholecystectomy. No biliary dilatation identified. Pancreas: Unremarkable Spleen: Unremarkable Adrenals/Urinary Tract: Stable scarring of the right kidney upper pole. No hydronephrosis or renal calculi identified. The adrenal glands appear unremarkable. Urinary bladder unremarkable. Stomach/Bowel: Acute appendicitis is present, with an appendicolith along the appendiceal orifice; appendiceal diameter 1.3 cm, and periappendiceal inflammatory stranding. No  abnormal extraluminal gas or abscess. Vascular/Lymphatic: Atherosclerosis is present, including aortoiliac atherosclerotic disease. Reproductive: Unremarkable Other: No supplemental non-categorized findings. Musculoskeletal: Mild lumbar spondylosis. IMPRESSION: 1. Acute appendicitis, appendiceal diameter of up to 1.3 cm; appendicolith the appendiceal orifice; and Peri appendiceal inflammatory stranding. No extraluminal gas or abscess is currently identified. 2. Systemic and aortic Atherosclerosis (ICD10-I70.0). Coronary atherosclerosis. 3. Hepatic cirrhosis. Radiology assistant personnel have been notified to put me in telephone contact with the referring physician or the referring physician's clinical representative in order to discuss these findings. Once this communication is established I will issue an addendum to this report for documentation purposes. Electronically Signed: By: Van Clines M.D. On: 12/02/2020 16:13     Assessment and Plan:   Preop evaluation: -His RCRI score is 1 giving him a 0.9% perioperative risk of major cardiovascular event. - His DASI score is 15.45 giving him a functional capacity in METS of 4.64 - Based on these data, he is at acceptable risk for the planned procedure without further cardiac testing  2.  Chronic anticoagulation - He is anticoagulated with Coumadin for mechanical mitral valve and atrial fibrillation - His INR today is 3.3 - Okay to hold Coumadin, but would start heparin once his INR is less than 2.5 - MD advise on giving vitamin K  3.  Acute appendicitis - Reason for admission - Surgery has seen and surgery is planned if okay with cardiology and once his INR has decreased.   Risk Assessment/Risk Scores:      CHA2DS2-VASc Score = 3   This indicates a 3.2% annual risk of stroke. The patient's score is based upon: CHF History: 0 HTN History: 1 Diabetes History: 0 Stroke History: 0 Vascular Disease History: 0  Age Score: 2 Gender  Score: 0     For questions or updates, please contact Dorris Please consult [email or web address] for contact info under    Signed, Rosaria Ferries, [name]  12/03/2020 11:27 AM   History and all data above reviewed.  Patient examined.  I agree with the findings as above.   The patient has a high functional level.  He has had no chest discomfort or other high risk symptoms.  The patient denies any new symptoms such as chest discomfort, neck or arm discomfort. There has been no new shortness of breath, PND or orthopnea. There have been no reported palpitations, presyncope or syncope.  The patient exam reveals [id number] S1  ,  Lungs: Clear  ,  Abd: Positive bowel sounds, no rebound no guarding, Ext No edema  .  All available labs, radiology testing, previous records reviewed. Agree with documented assessment and plan.   Preop: The patient has a high functional level.  He has no high risk findings or symptoms.  He is going for procedure that would not be high risk from a cardiovascular standpoint.  Therefore, according to ACC/AHA guidelines.  Timing of surgery will dictate the reversal agent.   If we can wait 24 hours we can reverse with  Vit K.  If not he will need [facility].  I have a message out to the primary care.   (Addendum:  Surgery responded and reports that surgery will likely be tomorrow.  OK to treat with Vit K IV today to reverse warfarin.  Pharmacy consulted to start heparin when INR less than 2.5 and hold when instructed by/for surgery.)     Branson Zharia Conrow  12:06 PM  12/03/2020

## 2020-12-03 NOTE — ED Notes (Signed)
Surgery at bedside.

## 2020-12-04 DIAGNOSIS — I4821 Permanent atrial fibrillation: Secondary | ICD-10-CM | POA: Diagnosis not present

## 2020-12-04 DIAGNOSIS — K3580 Unspecified acute appendicitis: Secondary | ICD-10-CM | POA: Diagnosis not present

## 2020-12-04 LAB — BASIC METABOLIC PANEL
Anion gap: 11 (ref 5–15)
BUN: 16 mg/dL (ref 8–23)
CO2: 22 mmol/L (ref 22–32)
Calcium: 8.9 mg/dL (ref 8.9–10.3)
Chloride: 101 mmol/L (ref 98–111)
Creatinine, Ser: 1.09 mg/dL (ref 0.61–1.24)
GFR, Estimated: >60 mL/min (ref >60)
Glucose, Bld: 86 mg/dL (ref 70–99)
Potassium: 3.8 mmol/L (ref 3.5–5.1)
Sodium: 134 mmol/L — ABNORMAL LOW (ref 135–145)

## 2020-12-04 LAB — HEPARIN LEVEL (UNFRACTIONATED): Heparin Unfractionated: 0.17 IU/mL — ABNORMAL LOW (ref 0.30–0.70)

## 2020-12-04 LAB — CBC WITH DIFFERENTIAL/PLATELET
Abs Immature Granulocytes: 0.07 10*3/uL (ref 0.00–0.07)
Basophils Absolute: 0 10*3/uL (ref 0.0–0.1)
Basophils Relative: 0 %
Eosinophils Absolute: 0.2 10*3/uL (ref 0.0–0.5)
Eosinophils Relative: 3 %
HCT: 34.1 % — ABNORMAL LOW (ref 39.0–52.0)
Hemoglobin: 11.9 g/dL — ABNORMAL LOW (ref 13.0–17.0)
Immature Granulocytes: 1 %
Lymphocytes Relative: 10 %
Lymphs Abs: 0.9 10*3/uL (ref 0.7–4.0)
MCH: 33.9 pg (ref 26.0–34.0)
MCHC: 34.9 g/dL (ref 30.0–36.0)
MCV: 97.2 fL (ref 80.0–100.0)
Monocytes Absolute: 0.6 10*3/uL (ref 0.1–1.0)
Monocytes Relative: 7 %
Neutro Abs: 7.4 10*3/uL (ref 1.7–7.7)
Neutrophils Relative %: 79 %
Platelets: 198 10*3/uL (ref 150–400)
RBC: 3.51 MIL/uL — ABNORMAL LOW (ref 4.22–5.81)
RDW: 14 % (ref 11.5–15.5)
WBC: 9.3 10*3/uL (ref 4.0–10.5)
nRBC: 0 % (ref 0.0–0.2)

## 2020-12-04 LAB — PROTIME-INR
INR: 1.7 — ABNORMAL HIGH (ref 0.8–1.2)
Prothrombin Time: 19.5 seconds — ABNORMAL HIGH (ref 11.4–15.2)

## 2020-12-04 LAB — SURGICAL PCR SCREEN
MRSA, PCR: NEGATIVE
Staphylococcus aureus: NEGATIVE

## 2020-12-04 LAB — GLUCOSE, CAPILLARY
Glucose-Capillary: 113 mg/dL — ABNORMAL HIGH (ref 70–99)
Glucose-Capillary: 113 mg/dL — ABNORMAL HIGH (ref 70–99)
Glucose-Capillary: 80 mg/dL (ref 70–99)
Glucose-Capillary: 99 mg/dL (ref 70–99)

## 2020-12-04 LAB — APTT: aPTT: 55 seconds — ABNORMAL HIGH (ref 24–36)

## 2020-12-04 MED ORDER — HEPARIN (PORCINE) 25000 UT/250ML-% IV SOLN
1050.0000 [IU]/h | INTRAVENOUS | Status: DC
  Administered 2020-12-04: 1050 [IU]/h via INTRAVENOUS
  Filled 2020-12-04: qty 250

## 2020-12-04 MED ORDER — HEPARIN (PORCINE) 25000 UT/250ML-% IV SOLN
1200.0000 [IU]/h | INTRAVENOUS | Status: AC
  Administered 2020-12-04: 1050 [IU]/h via INTRAVENOUS
  Filled 2020-12-04: qty 250

## 2020-12-04 MED ORDER — DEXTROSE-NACL 5-0.9 % IV SOLN: INTRAVENOUS | Status: DC

## 2020-12-04 NOTE — Progress Notes (Signed)
ANTICOAGULATION CONSULT NOTE - Initial Consult  Pharmacy Consult for Heparin Indication: A.Fib  Allergies  Allergen Reactions   Cardizem [Diltiazem]     Bradycardia per pt Doing well on Amlodipine   Sulfonamide Derivatives     Unknown     Patient Measurements: Height: '5\' 10"'$  (177.8 cm) Weight: 74 kg (163 lb 2.3 oz) IBW/kg (Calculated) : 73 Heparin Dosing Weight: 76.5  Vital Signs: Temp: 98.6 F (37 C) (09/11 0517) Temp Source: Oral (09/11 0517) BP: 122/66 (09/11 0517) Pulse Rate: 77 (09/11 0517)  Labs: Recent Labs    12/02/20 1143 12/02/20 1415 12/03/20 0147 12/04/20 0041  HGB 11.9*  --  12.1* 11.9*  HCT 35.7*  --  35.7* 34.1*  PLT 203.0  --  180 198  LABPROT  --   --  33.8* 19.5*  INR  --  3.3* 3.3* 1.7*  CREATININE 1.30  --  1.26* 1.09     Estimated Creatinine Clearance: 51.2 mL/min (by C-G formula based on SCr of 1.09 mg/dL).   Medical History: 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   NSVT (nonsustained ventricular tachycardia) (HCC)    Peripheral edema    venous insufficiency   Permanent atrial fibrillation (HCC)    Pulmonary hypertension (HCC)    Syncope    TIA (transient ischemic attack)    while on coumadin   Type II or unspecified type diabetes mellitus without mention of complication, not stated as uncontrolled    lifestyle mangement   Ventricular ectopy    symptomatic    Medications:  Scheduled:   furosemide  40 mg Intravenous BID   metoprolol tartrate  5 mg Intravenous Q8H    Assessment: 87 year old male with a history of  mechanical mitral valve and atrial fibrillation. On coumadin PTA, seen on 9/40 for preop evaluation for acute appendicitis. INR on 9/10 is 3.3. Vit K '2mg'$  x1 dose given.  9/11: INR 1.7, will  proceed with heparin start  Goal of Therapy:  Heparin level 0.3-0.7 units/ml aPTT 66-102 seconds Monitor platelets by anticoagulation protocol: Yes   Plan:  Start Heparin drip at 1050 units/hour Heparin level ~8 hours after start of infusion Monitor daily heparin levels and CBC   Lestine Box, PharmD PGY2 Infectious Diseases Pharmacy Resident   Please check [email or web address] for unit-specific pharmacy phone numbers

## 2020-12-04 NOTE — Progress Notes (Addendum)
Progress Note    Anthony Gould  [id number] DOB: 1933-06-15  DOA: 12/02/2020 PCP: Cassandria Anger, MD      Brief Narrative:    Medical records reviewed and are as summarized below:  Anthony Gould is a 87 y.o. male  male with medical history significant of mitral regurgitation with a history of Medtronic-Hall mitral valve replacement in 1986, permanent atrial fibrillation, pulmonary hypertension, NSVT, history of ventricular ectopy, history of TIA, syncopal episode, hypertension, type II DM, stage IIIa CKD, elevated TSH, hypertension, hyperlipidemia.  He presented to the hospital because of abdominal pain, vomiting, loose stools, decreased appetite and malaise.  He was found to have acute appendicitis.  He was treated with empiric IV Zosyn, IV fluids and analgesics.      Assessment/Plan:   Principal Problem:   Acute appendicitis Active Problems:   DM2 (diabetes mellitus, type 2) ([place])   Hyperlipidemia   Essential hypertension   Coronary atherosclerosis   Atrial fibrillation (HCC)   MITRAL VALVE REPLACEMENT, HX OF   Chronic diastolic heart failure (HCC)   CRI (chronic renal insufficiency), stage 3 (moderate) (HCC)    Acute appendicitis: Keep NPO.  Continue IV Zosyn and IV fluids.  Analgesics as needed for pain.  Plan for appendectomy tomorrow.  Follow-up with general surgeon.   S/p mechanical mitral valve replacement, permanent atrial fibrillation: S/p 1 dose of IV vitamin K on 12/03/2020.  INR is down to 1.7.  He has been started on IV heparin infusion because of subtherapeutic INR.  Monitor heparin level per protocol.  Plan to discontinue heparin infusion tomorrow morning before surgery.    Hyponatremia: Improving.  Continue IV fluids.  Other comorbidities including chronic diastolic CHF, CAD, compensated liver cirrhosis, history of TIA, CKD stage IIIa   Diet Order             Diet NPO time specified  Diet effective midnight           Diet NPO  time specified  Diet effective now                      Consultants: Cardiologist General surgeon  Procedures: None    Medications:    furosemide  40 mg Intravenous BID   metoprolol tartrate  5 mg Intravenous Q8H   Continuous Infusions:  dextrose 5 % and 0.9% NaCl 75 mL/hr at 12/04/20 0916   heparin 1,050 Units/hr (12/04/20 0955)   piperacillin-tazobactam 3.375 g (12/04/20 0951)     Anti-infectives (From admission, onward)    Start     Dose/Rate Route Frequency Ordered Stop   12/03/20 0200  piperacillin-tazobactam (ZOSYN) IVPB 3.375 g        3.375 g 12.5 mL/hr over 240 Minutes Intravenous Every 8 hours 12/03/20 0121     12/03/20 0100  piperacillin-tazobactam (ZOSYN) IVPB 3.375 g  Status:  Discontinued        3.375 g 100 mL/hr over 30 Minutes Intravenous  Once 12/03/20 0047 12/03/20 0121              Family Communication/Anticipated D/C date and plan/Code Status   DVT prophylaxis:      Code Status: Full Code  Family Communication: None Disposition Plan:    Status is: Inpatient  Remains inpatient appropriate because:IV treatments appropriate due to intensity of illness or inability to take PO and Inpatient level of care appropriate due to severity of illness  Dispo: The patient is from: Home  Anticipated d/c is to: Home              Patient currently is not medically stable to d/c.   Difficult to place patient No           Subjective:   Interval events noted.  He complains of soreness in the right lower abdomen.  No vomiting or diarrhea.   Objective:    Vitals:   12/03/20 2325 12/03/20 2327 12/04/20 0517 12/04/20 1123  BP:  (!) 134/57 122/66 132/65  Pulse: 76 73 77 77  Resp:    18  Temp:  98.2 F (36.8 C) 98.6 F (37 C) 98.4 F (36.9 C)  TempSrc:  Oral Oral Oral  SpO2: 100% 100% 99% 98%  Weight:      Height:       No data found.   Intake/Output Summary (Last 24 hours) at 12/04/2020 1258 Last data  filed at 12/04/2020 0700 Gross per 24 hour  Intake 4025.69 ml  Output --  Net 4025.69 ml   Filed Weights   12/03/20 1700  Weight: 74 kg    Exam:  GEN: NAD SKIN: No rash EYES: EOMI ENT: MMM CV: Irregular rate and rhythm PULM: CTA B ABD: soft, ND, RLQ tenderness, no rebound tenderness or guarding, +BS CNS: AAO x 3, non focal EXT: No edema or tenderness         Data Reviewed:   I have personally reviewed following labs and imaging studies:  Labs: Labs show the following:   Basic Metabolic Panel: Recent Labs  Lab 12/02/20 1143 12/03/20 0147 12/04/20 0041  NA 132* 129* 134*  K 4.3 3.8 3.8  CL 98 98 101  CO2 25 21* 22  GLUCOSE 116* 119* 86  BUN '18 17 16  '$ CREATININE 1.30 1.26* 1.09  CALCIUM 9.3 9.0 8.9   GFR Estimated Creatinine Clearance: 51.2 mL/min (by C-G formula based on SCr of 1.09 mg/dL). Liver Function Tests: Recent Labs  Lab 12/02/20 1143 12/03/20 0147  AST 28 37  ALT 18 27  ALKPHOS 77 78  BILITOT 2.0* 1.9*  PROT 6.8 6.6  ALBUMIN 3.9 3.6   No results for input(s): LIPASE, AMYLASE in the last 168 hours. No results for input(s): AMMONIA in the last 168 hours. Coagulation profile Recent Labs  Lab 12/02/20 1415 12/03/20 0147 12/04/20 0041  INR 3.3* 3.3* 1.7*    CBC: Recent Labs  Lab 12/02/20 1143 12/03/20 0147 12/04/20 0041  WBC 11.9* 12.6* 9.3  NEUTROABS  --   --  7.4  HGB 11.9* 12.1* 11.9*  HCT 35.7* 35.7* 34.1*  MCV 100.2* 98.6 97.2  PLT 203.0 180 198   Cardiac Enzymes: No results for input(s): CKTOTAL, CKMB, CKMBINDEX, TROPONINI in the last 168 hours. BNP (last 3 results) No results for input(s): PROBNP in the last 8760 hours. CBG: Recent Labs  Lab 12/03/20 1907 12/03/20 2324 12/04/20 0512 12/04/20 1124  GLUCAP 88 75 80 99   D-Dimer: No results for input(s): DDIMER in the last 72 hours. Hgb A1c: Recent Labs    12/03/20 1000  HGBA1C 5.3   Lipid Profile: No results for input(s): CHOL, HDL, LDLCALC, TRIG,  CHOLHDL, LDLDIRECT in the last 72 hours. Thyroid function studies: No results for input(s): TSH, T4TOTAL, T3FREE, THYROIDAB in the last 72 hours.  Invalid input(s): FREET3 Anemia work up: No results for input(s): VITAMINB12, FOLATE, FERRITIN, TIBC, IRON, RETICCTPCT in the last 72 hours. Sepsis Labs: Recent Labs  Lab 12/02/20 1143 12/03/20 0147 12/04/20 0041  WBC 11.9* 12.6* 9.3    Microbiology Recent Results (from the past 240 hour(s))  Resp Panel by RT-PCR (Flu A&B, Covid) Nasopharyngeal Swab     Status: None   Collection Time: 12/02/20  7:15 PM   Specimen: Nasopharyngeal Swab; Nasopharyngeal(NP) swabs in vial transport medium  Result Value Ref Range Status   SARS Coronavirus 2 by RT PCR NEGATIVE NEGATIVE Final    Comment: (NOTE) SARS-CoV-2 target nucleic acids are NOT DETECTED.  The SARS-CoV-2 RNA is generally detectable in upper respiratory specimens during the acute phase of infection. The lowest concentration of SARS-CoV-2 viral copies this assay can detect is 138 copies/mL. A negative result does not preclude SARS-Cov-2 infection and should not be used as the sole basis for treatment or other patient management decisions. A negative result may occur with  improper specimen collection/handling, submission of specimen other than nasopharyngeal swab, presence of viral mutation(s) within the areas targeted by this assay, and inadequate number of viral copies(<138 copies/mL). A negative result must be combined with clinical observations, patient history, and epidemiological information. The expected result is Negative.  Fact Sheet for Patients:  EntrepreneurPulse.com.au  Fact Sheet for Healthcare Providers:  IncredibleEmployment.be  This test is no t yet approved or cleared by the [place] FDA and  has been authorized for detection and/or diagnosis of SARS-CoV-2 by FDA under an Emergency Use Authorization (EUA). This EUA will  remain  in effect (meaning this test can be used) for the duration of the COVID-19 declaration under Section 564(b)(1) of the Act, 21 U.S.C.section 360bbb-3(b)(1), unless the authorization is terminated  or revoked sooner.       Influenza A by PCR NEGATIVE NEGATIVE Final   Influenza B by PCR NEGATIVE NEGATIVE Final    Comment: (NOTE) The Xpert Xpress SARS-CoV-2/FLU/RSV plus assay is intended as an aid in the diagnosis of influenza from Nasopharyngeal swab specimens and should not be used as a sole basis for treatment. Nasal washings and aspirates are unacceptable for Xpert Xpress SARS-CoV-2/FLU/RSV testing.  Fact Sheet for Patients: EntrepreneurPulse.com.au  Fact Sheet for Healthcare Providers: IncredibleEmployment.be  This test is not yet approved or cleared by the [place] FDA and has been authorized for detection and/or diagnosis of SARS-CoV-2 by FDA under an Emergency Use Authorization (EUA). This EUA will remain in effect (meaning this test can be used) for the duration of the COVID-19 declaration under Section 564(b)(1) of the Act, 21 U.S.C. section 360bbb-3(b)(1), unless the authorization is terminated or revoked.  Performed at [facility], [place] [address] [place], [place] [address]     Procedures and diagnostic studies:  CT Abdomen Pelvis Wo Contrast  Addendum Date: 12/02/2020   ADDENDUM REPORT: 12/02/2020 16:30 ADDENDUM: The original report was by Dr. Van Clines. The following addendum is by Dr. Van Clines: These results were called by telephone at the time of interpretation on 12/02/2020 at 4:24 pm to provider Dr. Jeralyn Ruths , who verbally acknowledged these results. Electronically Signed   By: Van Clines M.D.   On: 12/02/2020 16:30   Result Date: 12/02/2020 CLINICAL DATA:  Right lower quadrant abdominal pain. Reduced appetite. Low-grade fever. EXAM: CT ABDOMEN AND PELVIS WITHOUT CONTRAST TECHNIQUE:  Multidetector CT imaging of the abdomen and pelvis was performed following the standard protocol without IV contrast. COMPARISON:  09/23/2015 and hepatobiliary ultrasound of 04/26/2020 FINDINGS: Lower chest: Mitral valve prosthesis. Atherosclerotic calcification involving the descending thoracic aorta and right coronary artery. Mild cardiomegaly. Hepatobiliary: Nodular contour the liver compatible with cirrhosis.  No noncontrast CT evidence of hepatic mass. Cholecystectomy. No biliary dilatation identified. Pancreas: Unremarkable Spleen: Unremarkable Adrenals/Urinary Tract: Stable scarring of the right kidney upper pole. No hydronephrosis or renal calculi identified. The adrenal glands appear unremarkable. Urinary bladder unremarkable. Stomach/Bowel: Acute appendicitis is present, with an appendicolith along the appendiceal orifice; appendiceal diameter 1.3 cm, and periappendiceal inflammatory stranding. No abnormal extraluminal gas or abscess. Vascular/Lymphatic: Atherosclerosis is present, including aortoiliac atherosclerotic disease. Reproductive: Unremarkable Other: No supplemental non-categorized findings. Musculoskeletal: Mild lumbar spondylosis. IMPRESSION: 1. Acute appendicitis, appendiceal diameter of up to 1.3 cm; appendicolith the appendiceal orifice; and Peri appendiceal inflammatory stranding. No extraluminal gas or abscess is currently identified. 2. Systemic and aortic Atherosclerosis (ICD10-I70.0). Coronary atherosclerosis. 3. Hepatic cirrhosis. Radiology assistant personnel have been notified to put me in telephone contact with the referring physician or the referring physician's clinical representative in order to discuss these findings. Once this communication is established I will issue an addendum to this report for documentation purposes. Electronically Signed: By: Van Clines M.D. On: 12/02/2020 16:13               LOS: 1 day   Annetta Deiss  [facility]   Pager  on [email or web address]. If 7PM-7AM, please contact night-coverage at [email or web address]     12/04/2020, 12:58 PM

## 2020-12-04 NOTE — Progress Notes (Signed)
[place] for Heparin Indication: A.Fib  Allergies  Allergen Reactions   Cardizem [Diltiazem]     Bradycardia per pt Doing well on Amlodipine   Sulfonamide Derivatives     Unknown     Patient Measurements: Height: '5\' 10"'$  (177.8 cm) Weight: 74 kg (163 lb 2.3 oz) IBW/kg (Calculated) : 73 Heparin Dosing Weight: 76.5  Vital Signs: Temp: 98.3 F (36.8 C) (09/11 1751) Temp Source: Oral (09/11 1751) BP: 136/64 (09/11 1751) Pulse Rate: 73 (09/11 1751)  Labs: Recent Labs    12/02/20 1143 12/02/20 1415 12/03/20 0147 12/04/20 0041 12/04/20 1652  HGB 11.9*  --  12.1* 11.9*  --   HCT 35.7*  --  35.7* 34.1*  --   PLT 203.0  --  180 198  --   APTT  --   --   --   --  55*  LABPROT  --   --  33.8* 19.5*  --   INR  --  3.3* 3.3* 1.7*  --   HEPARINUNFRC  --   --   --   --  0.17*  CREATININE 1.30  --  1.26* 1.09  --      Estimated Creatinine Clearance: 51.2 mL/min (by C-G formula based on SCr of 1.09 mg/dL).   Medical History: 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   NSVT (nonsustained ventricular tachycardia) (HCC)    Peripheral edema    venous insufficiency   Permanent atrial fibrillation (HCC)    Pulmonary hypertension (HCC)    Syncope    TIA (transient ischemic attack)    while on coumadin   Type II or unspecified type diabetes mellitus without mention of complication, not stated as uncontrolled    lifestyle mangement   Ventricular ectopy    symptomatic    Medications:  Scheduled:   furosemide  40 mg Intravenous BID   metoprolol tartrate  5 mg Intravenous Q8H    Assessment: 87 year old male with a history of  mechanical mitral valve and atrial fibrillation. On coumadin PTA, seen on 9/40 for preop  evaluation for acute appendicitis. INR on 9/10 is 3.3. Vit K '2mg'$  x1 dose given.  9/11: INR 1.7, will proceed with heparin start  Initial heparin level below goal range this evening at 0.17; no known issues with IV infusion.  No overt bleeding or complications noted.  Goal of Therapy:  Heparin level 0.3-0.7 units/ml aPTT 66-102 seconds Monitor platelets by anticoagulation protocol: Yes   Plan:  Increase IV heparin to 1200 units/hr. Recheck heparin level with AM labs. Heparin off at 0600 for OR.  [place] Crane, Roylene Reason, BCCP Clinical Pharmacist  12/04/2020 6:08 PM   [facility] pharmacy phone numbers are listed on [email or web address]

## 2020-12-04 NOTE — H&P (View-Only) (Signed)
Subjective/Chief Complaint: Improved pain today.    Objective: Vital signs in last 24 hours: Temp:  [97.8 F (36.6 C)-99.3 F (37.4 C)] 98.6 F (37 C) (09/11 0517) Pulse Rate:  [69-88] 77 (09/11 0517) Resp:  [15-25] 18 (09/10 1909) BP: (104-147)/(57-74) 122/66 (09/11 0517) SpO2:  [96 %-100 %] 99 % (09/11 0517) Weight:  [74 kg] 74 kg (09/10 1700) Last BM Date: 12/03/20  Intake/Output from previous day: 09/10 0701 - 09/11 0700 In: 4025.7 [P.O.:1930; I.V.:1894.4; IV Piggyback:201.3] Out: -  Intake/Output this shift: No intake/output data recorded.  PE:  Constitutional: No acute distress, conversant, appears states age. Eyes: Anicteric sclerae, moist conjunctiva, no lid lag Lungs: Clear to auscultation bilaterally, normal respiratory effort CV: regular rate and rhythm, no murmurs, no peripheral edema, pedal pulses 2+ GI: Soft, no masses or hepatosplenomegaly, tender to palpation RLQ Skin: No rashes, palpation reveals normal turgor Psychiatric: appropriate judgment and insight, oriented to person, place, and time   Lab Results:  Recent Labs    12/03/20 0147 12/04/20 0041  WBC 12.6* 9.3  HGB 12.1* 11.9*  HCT 35.7* 34.1*  PLT 180 198   BMET Recent Labs    12/03/20 0147 12/04/20 0041  NA 129* 134*  K 3.8 3.8  CL 98 101  CO2 21* 22  GLUCOSE 119* 86  BUN 17 16  CREATININE 1.26* 1.09  CALCIUM 9.0 8.9   PT/INR Recent Labs    12/03/20 0147 12/04/20 0041  LABPROT 33.8* 19.5*  INR 3.3* 1.7*   ABG No results for input(s): PHART, HCO3 in the last 72 hours.  Invalid input(s): PCO2, PO2  Studies/Results: CT Abdomen Pelvis Wo Contrast  Addendum Date: 12/02/2020   ADDENDUM REPORT: 12/02/2020 16:30 ADDENDUM: The original report was by Dr. Van Clines. The following addendum is by Dr. Van Clines: These results were called by telephone at the time of interpretation on 12/02/2020 at 4:24 pm to provider Dr. Jeralyn Ruths , who verbally acknowledged these  results. Electronically Signed   By: Van Clines M.D.   On: 12/02/2020 16:30   Result Date: 12/02/2020 CLINICAL DATA:  Right lower quadrant abdominal pain. Reduced appetite. Low-grade fever. EXAM: CT ABDOMEN AND PELVIS WITHOUT CONTRAST TECHNIQUE: Multidetector CT imaging of the abdomen and pelvis was performed following the standard protocol without IV contrast. COMPARISON:  09/23/2015 and hepatobiliary ultrasound of 04/26/2020 FINDINGS: Lower chest: Mitral valve prosthesis. Atherosclerotic calcification involving the descending thoracic aorta and right coronary artery. Mild cardiomegaly. Hepatobiliary: Nodular contour the liver compatible with cirrhosis. No noncontrast CT evidence of hepatic mass. Cholecystectomy. No biliary dilatation identified. Pancreas: Unremarkable Spleen: Unremarkable Adrenals/Urinary Tract: Stable scarring of the right kidney upper pole. No hydronephrosis or renal calculi identified. The adrenal glands appear unremarkable. Urinary bladder unremarkable. Stomach/Bowel: Acute appendicitis is present, with an appendicolith along the appendiceal orifice; appendiceal diameter 1.3 cm, and periappendiceal inflammatory stranding. No abnormal extraluminal gas or abscess. Vascular/Lymphatic: Atherosclerosis is present, including aortoiliac atherosclerotic disease. Reproductive: Unremarkable Other: No supplemental non-categorized findings. Musculoskeletal: Mild lumbar spondylosis. IMPRESSION: 1. Acute appendicitis, appendiceal diameter of up to 1.3 cm; appendicolith the appendiceal orifice; and Peri appendiceal inflammatory stranding. No extraluminal gas or abscess is currently identified. 2. Systemic and aortic Atherosclerosis (ICD10-I70.0). Coronary atherosclerosis. 3. Hepatic cirrhosis. Radiology assistant personnel have been notified to put me in telephone contact with the referring physician or the referring physician's clinical representative in order to discuss these findings. Once this  communication is established I will issue an addendum to this report for documentation purposes. Electronically  Signed: By: Van Clines M.D. On: 12/02/2020 16:13    Anti-infectives: Anti-infectives (From admission, onward)    Start     Dose/Rate Route Frequency Ordered Stop   12/03/20 0200  piperacillin-tazobactam (ZOSYN) IVPB 3.375 g        3.375 g 12.5 mL/hr over 240 Minutes Intravenous Every 8 hours 12/03/20 0121     12/03/20 0100  piperacillin-tazobactam (ZOSYN) IVPB 3.375 g  Status:  Discontinued        3.375 g 100 mL/hr over 30 Minutes Intravenous  Once 12/03/20 0047 12/03/20 0121       Assessment/Plan: Anthony Gould is an 87 y.o. male with hx HTN, HLD, DM, afib, CKD, mechanical mitral valve (on warfarin), cirrhosis (noted on RUQ [place] 04/26/20) acute appendicitis   -We will plan on surgery 9/12 INR less than 1.5 by Dr. Barry Dienes -Hold heparin tomorrow a.m. at 0600 -Continue with antibiotics.    LOS: 1 day    Ralene Ok 12/04/2020

## 2020-12-04 NOTE — Progress Notes (Signed)
Subjective/Chief Complaint: Improved pain today.    Objective: Vital signs in last 24 hours: Temp:  [97.8 F (36.6 C)-99.3 F (37.4 C)] 98.6 F (37 C) (09/11 0517) Pulse Rate:  [69-88] 77 (09/11 0517) Resp:  [15-25] 18 (09/10 1909) BP: (104-147)/(57-74) 122/66 (09/11 0517) SpO2:  [96 %-100 %] 99 % (09/11 0517) Weight:  [74 kg] 74 kg (09/10 1700) Last BM Date: 12/03/20  Intake/Output from previous day: 09/10 0701 - 09/11 0700 In: 4025.7 [P.O.:1930; I.V.:1894.4; IV Piggyback:201.3] Out: -  Intake/Output this shift: No intake/output data recorded.  PE:  Constitutional: No acute distress, conversant, appears states age. Eyes: Anicteric sclerae, moist conjunctiva, no lid lag Lungs: Clear to auscultation bilaterally, normal respiratory effort CV: regular rate and rhythm, no murmurs, no peripheral edema, pedal pulses 2+ GI: Soft, no masses or hepatosplenomegaly, tender to palpation RLQ Skin: No rashes, palpation reveals normal turgor Psychiatric: appropriate judgment and insight, oriented to person, place, and time   Lab Results:  Recent Labs    12/03/20 0147 12/04/20 0041  WBC 12.6* 9.3  HGB 12.1* 11.9*  HCT 35.7* 34.1*  PLT 180 198   BMET Recent Labs    12/03/20 0147 12/04/20 0041  NA 129* 134*  K 3.8 3.8  CL 98 101  CO2 21* 22  GLUCOSE 119* 86  BUN 17 16  CREATININE 1.26* 1.09  CALCIUM 9.0 8.9   PT/INR Recent Labs    12/03/20 0147 12/04/20 0041  LABPROT 33.8* 19.5*  INR 3.3* 1.7*   ABG No results for input(s): PHART, HCO3 in the last 72 hours.  Invalid input(s): PCO2, PO2  Studies/Results: CT Abdomen Pelvis Wo Contrast  Addendum Date: 12/02/2020   ADDENDUM REPORT: 12/02/2020 16:30 ADDENDUM: The original report was by Dr. Van Clines. The following addendum is by Dr. Van Clines: These results were called by telephone at the time of interpretation on 12/02/2020 at 4:24 pm to provider Dr. Jeralyn Ruths , who verbally acknowledged these  results. Electronically Signed   By: Van Clines M.D.   On: 12/02/2020 16:30   Result Date: 12/02/2020 CLINICAL DATA:  Right lower quadrant abdominal pain. Reduced appetite. Low-grade fever. EXAM: CT ABDOMEN AND PELVIS WITHOUT CONTRAST TECHNIQUE: Multidetector CT imaging of the abdomen and pelvis was performed following the standard protocol without IV contrast. COMPARISON:  09/23/2015 and hepatobiliary ultrasound of 04/26/2020 FINDINGS: Lower chest: Mitral valve prosthesis. Atherosclerotic calcification involving the descending thoracic aorta and right coronary artery. Mild cardiomegaly. Hepatobiliary: Nodular contour the liver compatible with cirrhosis. No noncontrast CT evidence of hepatic mass. Cholecystectomy. No biliary dilatation identified. Pancreas: Unremarkable Spleen: Unremarkable Adrenals/Urinary Tract: Stable scarring of the right kidney upper pole. No hydronephrosis or renal calculi identified. The adrenal glands appear unremarkable. Urinary bladder unremarkable. Stomach/Bowel: Acute appendicitis is present, with an appendicolith along the appendiceal orifice; appendiceal diameter 1.3 cm, and periappendiceal inflammatory stranding. No abnormal extraluminal gas or abscess. Vascular/Lymphatic: Atherosclerosis is present, including aortoiliac atherosclerotic disease. Reproductive: Unremarkable Other: No supplemental non-categorized findings. Musculoskeletal: Mild lumbar spondylosis. IMPRESSION: 1. Acute appendicitis, appendiceal diameter of up to 1.3 cm; appendicolith the appendiceal orifice; and Peri appendiceal inflammatory stranding. No extraluminal gas or abscess is currently identified. 2. Systemic and aortic Atherosclerosis (ICD10-I70.0). Coronary atherosclerosis. 3. Hepatic cirrhosis. Radiology assistant personnel have been notified to put me in telephone contact with the referring physician or the referring physician's clinical representative in order to discuss these findings. Once this  communication is established I will issue an addendum to this report for documentation purposes. Electronically  Signed: By: Van Clines M.D. On: 12/02/2020 16:13    Anti-infectives: Anti-infectives (From admission, onward)    Start     Dose/Rate Route Frequency Ordered Stop   12/03/20 0200  piperacillin-tazobactam (ZOSYN) IVPB 3.375 g        3.375 g 12.5 mL/hr over 240 Minutes Intravenous Every 8 hours 12/03/20 0121     12/03/20 0100  piperacillin-tazobactam (ZOSYN) IVPB 3.375 g  Status:  Discontinued        3.375 g 100 mL/hr over 30 Minutes Intravenous  Once 12/03/20 0047 12/03/20 0121       Assessment/Plan: Anthony Gould is an 87 y.o. male with hx HTN, HLD, DM, afib, CKD, mechanical mitral valve (on warfarin), cirrhosis (noted on RUQ [place] 04/26/20) acute appendicitis   -We will plan on surgery 9/12 INR less than 1.5 by Dr. Barry Dienes -Hold heparin tomorrow a.m. at 0600 -Continue with antibiotics.    LOS: 1 day    Ralene Ok 12/04/2020

## 2020-12-04 NOTE — Progress Notes (Signed)
Progress Note  Patient Name: Anthony Gould Date of Encounter: 12/04/2020  Primary Cardiologist:   Lauree Chandler, MD   Subjective   No chest pain or SOB.  Mild abdominal pain.   Inpatient Medications    Scheduled Meds:  furosemide  40 mg Intravenous BID   metoprolol tartrate  5 mg Intravenous Q8H   Continuous Infusions:  dextrose 5 % and 0.9% NaCl 75 mL/hr at 12/04/20 0916   heparin 1,050 Units/hr (12/04/20 0955)   piperacillin-tazobactam 3.375 g (12/04/20 0951)   PRN Meds: acetaminophen **OR** acetaminophen, HYDROmorphone (DILAUDID) injection, ondansetron **OR** ondansetron (ZOFRAN) IV   Vital Signs    Vitals:   12/03/20 2048 12/03/20 2325 12/03/20 2327 12/04/20 0517  BP: (!) 134/59  (!) 134/57 122/66  Pulse: 86 76 73 77  Resp:      Temp: 99.3 F (37.4 C)  98.2 F (36.8 C) 98.6 F (37 C)  TempSrc: Oral  Oral Oral  SpO2: 100% 100% 100% 99%  Weight:      Height:        Intake/Output Summary (Last 24 hours) at 12/04/2020 1007 Last data filed at 12/04/2020 0700 Gross per 24 hour  Intake 4025.69 ml  Output --  Net 4025.69 ml   Filed Weights   12/03/20 1700  Weight: 74 kg    Telemetry    Atrial fib, PVCs with brief runs of NSVT.  - Personally Reviewed  ECG    NA - Personally Reviewed  Physical Exam   GEN: No acute distress.   Neck: No  JVD Cardiac: Irregular RR, mechanical S1, no murmurs, rubs, or gallops.  Respiratory: Clear  to auscultation bilaterally. GI: Soft, mildly tender, non-distended  MS: No  edema; No deformity. Neuro:  Nonfocal  Psych: Normal affect   Labs    Chemistry Recent Labs  Lab 12/02/20 1143 12/03/20 0147 12/04/20 0041  NA 132* 129* 134*  K 4.3 3.8 3.8  CL 98 98 101  CO2 25 21* 22  GLUCOSE 116* 119* 86  BUN '18 17 16  '$ CREATININE 1.30 1.26* 1.09  CALCIUM 9.3 9.0 8.9  PROT 6.8 6.6  --   ALBUMIN 3.9 3.6  --   AST 28 37  --   ALT 18 27  --   ALKPHOS 77 78  --   BILITOT 2.0* 1.9*  --   GFRNONAA  --   56* >60  ANIONGAP  --  10 11     Hematology Recent Labs  Lab 12/02/20 1143 12/03/20 0147 12/04/20 0041  WBC 11.9* 12.6* 9.3  RBC 3.56* 3.62* 3.51*  HGB 11.9* 12.1* 11.9*  HCT 35.7* 35.7* 34.1*  MCV 100.2* 98.6 97.2  MCH  --  33.4 33.9  MCHC 33.4 33.9 34.9  RDW 15.4 13.9 14.0  PLT 203.0 180 198    Cardiac EnzymesNo results for input(s): TROPONINI in the last 168 hours. No results for input(s): TROPIPOC in the last 168 hours.   BNPNo results for input(s): BNP, PROBNP in the last 168 hours.   DDimer No results for input(s): DDIMER in the last 168 hours.   Radiology    CT Abdomen Pelvis Wo Contrast  Addendum Date: 12/02/2020   ADDENDUM REPORT: 12/02/2020 16:30 ADDENDUM: The original report was by Dr. Van Clines. The following addendum is by Dr. Van Clines: These results were called by telephone at the time of interpretation on 12/02/2020 at 4:24 pm to provider Dr. Jeralyn Ruths , who verbally acknowledged these results. Electronically Signed  By: Van Clines M.D.   On: 12/02/2020 16:30   Result Date: 12/02/2020 CLINICAL DATA:  Right lower quadrant abdominal pain. Reduced appetite. Low-grade fever. EXAM: CT ABDOMEN AND PELVIS WITHOUT CONTRAST TECHNIQUE: Multidetector CT imaging of the abdomen and pelvis was performed following the standard protocol without IV contrast. COMPARISON:  09/23/2015 and hepatobiliary ultrasound of 04/26/2020 FINDINGS: Lower chest: Mitral valve prosthesis. Atherosclerotic calcification involving the descending thoracic aorta and right coronary artery. Mild cardiomegaly. Hepatobiliary: Nodular contour the liver compatible with cirrhosis. No noncontrast CT evidence of hepatic mass. Cholecystectomy. No biliary dilatation identified. Pancreas: Unremarkable Spleen: Unremarkable Adrenals/Urinary Tract: Stable scarring of the right kidney upper pole. No hydronephrosis or renal calculi identified. The adrenal glands appear unremarkable. Urinary bladder  unremarkable. Stomach/Bowel: Acute appendicitis is present, with an appendicolith along the appendiceal orifice; appendiceal diameter 1.3 cm, and periappendiceal inflammatory stranding. No abnormal extraluminal gas or abscess. Vascular/Lymphatic: Atherosclerosis is present, including aortoiliac atherosclerotic disease. Reproductive: Unremarkable Other: No supplemental non-categorized findings. Musculoskeletal: Mild lumbar spondylosis. IMPRESSION: 1. Acute appendicitis, appendiceal diameter of up to 1.3 cm; appendicolith the appendiceal orifice; and Peri appendiceal inflammatory stranding. No extraluminal gas or abscess is currently identified. 2. Systemic and aortic Atherosclerosis (ICD10-I70.0). Coronary atherosclerosis. 3. Hepatic cirrhosis. Radiology assistant personnel have been notified to put me in telephone contact with the referring physician or the referring physician's clinical representative in order to discuss these findings. Once this communication is established I will issue an addendum to this report for documentation purposes. Electronically Signed: By: Van Clines M.D. On: 12/02/2020 16:13    Cardiac Studies   NA  Patient Profile     87 y.o. male with a hx of  severe mitral regurgitation s/p [employer] [place] MVR in 1986, permanent atrial fibrillation, pulmonary HTN with elevated PASP, HTN, HLD (followed by PCP), venous insufficiency, PVCs, CKD stage III by labs, cirrhotic changes of the liver by imaging (followed by PCP), former ETOH use, DM and prior TIA on coumadin, who is being seen 12/03/2020 for the preop evaluation of acute appendicitis at the request of Dr Mal Misty.    Assessment & Plan    PREOP:  Appendectomy tomorrow.  No further cardiac testing indicated.  MVR:   Warfarin reversed and on IV heparin.  Will need bridge and he would be interested in Lovenox if possible.    For questions or updates, please contact [facility] Please consult [email or web address] for contact info  under Cardiology/STEMI.   Signed, Minus Breeding, MD  12/04/2020, 10:07 AM

## 2020-12-04 NOTE — Progress Notes (Signed)
Heparin drip infusing at 10.55m/hr.heparin ordered to stop at 0600hrs tomorrow morning

## 2020-12-04 NOTE — Plan of Care (Signed)
?  Problem: Elimination: ?Goal: Will not experience complications related to bowel motility ?Outcome: Progressing ?  ?Problem: Pain Managment: ?Goal: General experience of comfort will improve ?Outcome: Progressing ?  ?Problem: Safety: ?Goal: Ability to remain free from injury will improve ?Outcome: Progressing ?  ?

## 2020-12-05 ENCOUNTER — Encounter (HOSPITAL_COMMUNITY)
Admission: EM | Disposition: A | Discharge status: Home / Self Care | Payer: Self-pay | Source: Home / Self Care | Attending: Internal Medicine

## 2020-12-05 ENCOUNTER — Telehealth: Payer: Self-pay | Admitting: Internal Medicine

## 2020-12-05 ENCOUNTER — Inpatient Hospital Stay (HOSPITAL_COMMUNITY): Payer: Medicare Other | Admitting: Registered Nurse

## 2020-12-05 DIAGNOSIS — K3580 Unspecified acute appendicitis: Secondary | ICD-10-CM | POA: Diagnosis not present

## 2020-12-05 HISTORY — PX: LAPAROSCOPIC APPENDECTOMY: SHX408

## 2020-12-05 LAB — CBC WITH DIFFERENTIAL/PLATELET
Abs Immature Granulocytes: 0.05 10*3/uL (ref 0.00–0.07)
Basophils Absolute: 0.1 10*3/uL (ref 0.0–0.1)
Basophils Relative: 1 %
Eosinophils Absolute: 0.3 10*3/uL (ref 0.0–0.5)
Eosinophils Relative: 4 %
HCT: 35.1 % — ABNORMAL LOW (ref 39.0–52.0)
Hemoglobin: 12 g/dL — ABNORMAL LOW (ref 13.0–17.0)
Immature Granulocytes: 1 %
Lymphocytes Relative: 16 %
Lymphs Abs: 1 10*3/uL (ref 0.7–4.0)
MCH: 33.5 pg (ref 26.0–34.0)
MCHC: 34.2 g/dL (ref 30.0–36.0)
MCV: 98 fL (ref 80.0–100.0)
Monocytes Absolute: 0.6 10*3/uL (ref 0.1–1.0)
Monocytes Relative: 9 %
Neutro Abs: 4.2 10*3/uL (ref 1.7–7.7)
Neutrophils Relative %: 69 %
Platelets: 230 10*3/uL (ref 150–400)
RBC: 3.58 MIL/uL — ABNORMAL LOW (ref 4.22–5.81)
RDW: 13.6 % (ref 11.5–15.5)
WBC: 6.2 10*3/uL (ref 4.0–10.5)
nRBC: 0 % (ref 0.0–0.2)

## 2020-12-05 LAB — GLUCOSE, CAPILLARY
Glucose-Capillary: 135 mg/dL — ABNORMAL HIGH (ref 70–99)
Glucose-Capillary: 137 mg/dL — ABNORMAL HIGH (ref 70–99)
Glucose-Capillary: 131 mg/dL — ABNORMAL HIGH (ref 70–99)
Glucose-Capillary: 146 mg/dL — ABNORMAL HIGH (ref 70–99)
Glucose-Capillary: 178 mg/dL — ABNORMAL HIGH (ref 70–99)

## 2020-12-05 LAB — HEPARIN LEVEL (UNFRACTIONATED): Heparin Unfractionated: <0.1 IU/mL — ABNORMAL LOW (ref 0.30–0.70)

## 2020-12-05 LAB — PROTIME-INR
INR: 1.3 — ABNORMAL HIGH (ref 0.8–1.2)
Prothrombin Time: 15.7 seconds — ABNORMAL HIGH (ref 11.4–15.2)

## 2020-12-05 SURGERY — APPENDECTOMY, LAPAROSCOPIC
Anesthesia: General | Site: Abdomen

## 2020-12-05 SURGERY — APPENDECTOMY, LAPAROSCOPIC
Anesthesia: General

## 2020-12-05 MED ORDER — LIDOCAINE 2% (20 MG/ML) 5 ML SYRINGE
INTRAMUSCULAR | Status: DC | PRN
  Administered 2020-12-05: 80 mg via INTRAVENOUS

## 2020-12-05 MED ORDER — OXYCODONE HCL 5 MG/5ML PO SOLN
5.0000 mg | Freq: Once | ORAL | Status: DC | PRN
Start: 2020-12-05 — End: 2020-12-05

## 2020-12-05 MED ORDER — PROPOFOL 10 MG/ML IV BOLUS
INTRAVENOUS | Status: AC
  Filled 2020-12-05: qty 20

## 2020-12-05 MED ORDER — PROPOFOL 10 MG/ML IV BOLUS
INTRAVENOUS | Status: DC | PRN
  Administered 2020-12-05: 80 mg via INTRAVENOUS

## 2020-12-05 MED ORDER — ONDANSETRON HCL 4 MG/2ML IJ SOLN: 4.0000 mg | Freq: Once | INTRAMUSCULAR | Status: DC | PRN

## 2020-12-05 MED ORDER — DEXAMETHASONE SODIUM PHOSPHATE 10 MG/ML IJ SOLN
INTRAMUSCULAR | Status: AC
  Filled 2020-12-05: qty 1

## 2020-12-05 MED ORDER — DROPERIDOL 2.5 MG/ML IJ SOLN
INTRAMUSCULAR | Status: AC
  Filled 2020-12-05: qty 2

## 2020-12-05 MED ORDER — PHENYLEPHRINE HCL-NACL 20-0.9 MG/250ML-% IV SOLN
INTRAVENOUS | Status: DC | PRN
  Administered 2020-12-05: 25 ug/min via INTRAVENOUS

## 2020-12-05 MED ORDER — DEXAMETHASONE SODIUM PHOSPHATE 10 MG/ML IJ SOLN
INTRAMUSCULAR | Status: DC | PRN
  Administered 2020-12-05: 10 mg via INTRAVENOUS

## 2020-12-05 MED ORDER — ACETAMINOPHEN 10 MG/ML IV SOLN
INTRAVENOUS | Status: DC | PRN
  Administered 2020-12-05: 1000 mg via INTRAVENOUS

## 2020-12-05 MED ORDER — FENTANYL CITRATE (PF) 100 MCG/2ML IJ SOLN
INTRAMUSCULAR | Status: AC
  Filled 2020-12-05: qty 2

## 2020-12-05 MED ORDER — CHLORHEXIDINE GLUCONATE 0.12 % MT SOLN
OROMUCOSAL | Status: AC
  Administered 2020-12-05: 15 mL
  Filled 2020-12-05: qty 15

## 2020-12-05 MED ORDER — PHENYLEPHRINE 40 MCG/ML (10ML) SYRINGE FOR IV PUSH (FOR BLOOD PRESSURE SUPPORT)
PREFILLED_SYRINGE | INTRAVENOUS | Status: DC | PRN
  Administered 2020-12-05 (×4): 80 ug via INTRAVENOUS

## 2020-12-05 MED ORDER — ONDANSETRON HCL 4 MG/2ML IJ SOLN
INTRAMUSCULAR | Status: DC | PRN
  Administered 2020-12-05: 4 mg via INTRAVENOUS

## 2020-12-05 MED ORDER — EPHEDRINE 5 MG/ML INJ
INTRAVENOUS | Status: AC
  Filled 2020-12-05: qty 5

## 2020-12-05 MED ORDER — ACETAMINOPHEN 160 MG/5ML PO SOLN: 325.0000 mg | ORAL | Status: DC | PRN

## 2020-12-05 MED ORDER — LIDOCAINE HCL 1 % IJ SOLN
INTRAMUSCULAR | Status: DC | PRN
  Administered 2020-12-05: 13 mL

## 2020-12-05 MED ORDER — DROPERIDOL 2.5 MG/ML IJ SOLN
INTRAMUSCULAR | Status: DC | PRN
  Administered 2020-12-05: .625 mg via INTRAVENOUS

## 2020-12-05 MED ORDER — LACTATED RINGERS IV SOLN: INTRAVENOUS | Status: DC | PRN

## 2020-12-05 MED ORDER — ONDANSETRON HCL 4 MG/2ML IJ SOLN
INTRAMUSCULAR | Status: AC
  Filled 2020-12-05: qty 2

## 2020-12-05 MED ORDER — SODIUM CHLORIDE 0.9 % IR SOLN
Status: DC | PRN
  Administered 2020-12-05: 1000 mL

## 2020-12-05 MED ORDER — FENTANYL CITRATE (PF) 100 MCG/2ML IJ SOLN
25.0000 ug | INTRAMUSCULAR | Status: DC | PRN
  Administered 2020-12-05 (×2): 25 ug via INTRAVENOUS

## 2020-12-05 MED ORDER — FENTANYL CITRATE (PF) 250 MCG/5ML IJ SOLN
INTRAMUSCULAR | Status: AC
  Filled 2020-12-05: qty 5

## 2020-12-05 MED ORDER — MEPERIDINE HCL 25 MG/ML IJ SOLN: 6.2500 mg | INTRAMUSCULAR | Status: DC | PRN

## 2020-12-05 MED ORDER — LIDOCAINE HCL (PF) 1 % IJ SOLN
INTRAMUSCULAR | Status: AC
  Filled 2020-12-05: qty 30

## 2020-12-05 MED ORDER — 0.9 % SODIUM CHLORIDE (POUR BTL) OPTIME
TOPICAL | Status: DC | PRN
  Administered 2020-12-05: 1000 mL

## 2020-12-05 MED ORDER — BUPIVACAINE-EPINEPHRINE (PF) 0.25% -1:200000 IJ SOLN
INTRAMUSCULAR | Status: AC
  Filled 2020-12-05: qty 30

## 2020-12-05 MED ORDER — PHENYLEPHRINE 40 MCG/ML (10ML) SYRINGE FOR IV PUSH (FOR BLOOD PRESSURE SUPPORT)
PREFILLED_SYRINGE | INTRAVENOUS | Status: AC
  Filled 2020-12-05: qty 10

## 2020-12-05 MED ORDER — ACETAMINOPHEN 325 MG PO TABS: 325.0000 mg | ORAL_TABLET | ORAL | Status: DC | PRN

## 2020-12-05 MED ORDER — FENTANYL CITRATE (PF) 250 MCG/5ML IJ SOLN
INTRAMUSCULAR | Status: DC | PRN
  Administered 2020-12-05 (×2): 25 ug via INTRAVENOUS
  Administered 2020-12-05: 50 ug via INTRAVENOUS
  Administered 2020-12-05 (×2): 25 ug via INTRAVENOUS

## 2020-12-05 MED ORDER — SUCCINYLCHOLINE CHLORIDE 200 MG/10ML IV SOSY
PREFILLED_SYRINGE | INTRAVENOUS | Status: AC
  Filled 2020-12-05: qty 10

## 2020-12-05 MED ORDER — SUGAMMADEX SODIUM 200 MG/2ML IV SOLN
INTRAVENOUS | Status: DC | PRN
  Administered 2020-12-05: 170 mg via INTRAVENOUS

## 2020-12-05 MED ORDER — OXYCODONE HCL 5 MG PO TABS: 5.0000 mg | ORAL_TABLET | Freq: Once | ORAL | Status: DC | PRN

## 2020-12-05 MED ORDER — HEPARIN (PORCINE) 25000 UT/250ML-% IV SOLN
1200.0000 [IU]/h | INTRAVENOUS | Status: DC
  Administered 2020-12-05 – 2020-12-06 (×2): 1350 [IU]/h via INTRAVENOUS
  Filled 2020-12-05 (×3): qty 250

## 2020-12-05 MED ORDER — ROCURONIUM BROMIDE 10 MG/ML (PF) SYRINGE
PREFILLED_SYRINGE | INTRAVENOUS | Status: DC | PRN
  Administered 2020-12-05: 70 mg via INTRAVENOUS
  Administered 2020-12-05: 10 mg via INTRAVENOUS
  Administered 2020-12-05: 30 mg via INTRAVENOUS

## 2020-12-05 MED ORDER — ACETAMINOPHEN 10 MG/ML IV SOLN
INTRAVENOUS | Status: AC
  Filled 2020-12-05: qty 100

## 2020-12-05 SURGICAL SUPPLY — 64 items
ADH SKN CLS APL DERMABOND .7 (GAUZE/BANDAGES/DRESSINGS) ×1
APL PRP STRL LF DISP 70% ISPRP (MISCELLANEOUS) ×1
APPLIER CLIP ROT 10 11.4 M/L (STAPLE)
APR CLP MED LRG 11.4X10 (STAPLE)
BAG SPEC RTRVL 10 TROC 200 (ENDOMECHANICALS) ×1
BIOPATCH RED 1 DISK 7.0 (GAUZE/BANDAGES/DRESSINGS) ×1 IMPLANT
BLADE CLIPPER SURG (BLADE) IMPLANT
CANISTER SUCT 3000ML PPV (MISCELLANEOUS) ×2 IMPLANT
CHLORAPREP W/TINT 26 (MISCELLANEOUS) ×2 IMPLANT
CLIP APPLIE ROT 10 11.4 M/L (STAPLE) IMPLANT
COVER SURGICAL LIGHT HANDLE (MISCELLANEOUS) ×2 IMPLANT
CUTTER FLEX LINEAR 45M (STAPLE) ×2 IMPLANT
DERMABOND ADVANCED (GAUZE/BANDAGES/DRESSINGS) ×1
DERMABOND ADVANCED .7 DNX12 (GAUZE/BANDAGES/DRESSINGS) ×1 IMPLANT
DEVICE SUT QUICK LOAD TK 5 (STAPLE) ×2 IMPLANT
DEVICE SUT TI-KNOT TK 5X26 (MISCELLANEOUS) ×1 IMPLANT
DEVICE SUTURE ENDOST 10MM (ENDOMECHANICALS) ×1 IMPLANT
DRAIN CHANNEL 19F RND (DRAIN) ×1 IMPLANT
DRAPE WARM FLUID 44X44 (DRAPES) ×2 IMPLANT
DRSG TEGADERM 4X4.75 (GAUZE/BANDAGES/DRESSINGS) ×1 IMPLANT
ELECT REM PT RETURN 9FT ADLT (ELECTROSURGICAL) ×2
ELECTRODE REM PT RTRN 9FT ADLT (ELECTROSURGICAL) ×1 IMPLANT
ENDOLOOP SUT PDS II  0 18 (SUTURE)
ENDOLOOP SUT PDS II 0 18 (SUTURE) IMPLANT
EVACUATOR SILICONE 100CC (DRAIN) ×1 IMPLANT
GLOVE SURG ENC MOIS LTX SZ6 (GLOVE) ×2 IMPLANT
GLOVE SURG UNDER LTX SZ6.5 (GLOVE) ×2 IMPLANT
GOWN STRL REUS W/ TWL LRG LVL3 (GOWN DISPOSABLE) ×2 IMPLANT
GOWN STRL REUS W/TWL 2XL LVL3 (GOWN DISPOSABLE) ×2 IMPLANT
GOWN STRL REUS W/TWL LRG LVL3 (GOWN DISPOSABLE) ×4
KIT BASIN OR (CUSTOM PROCEDURE TRAY) ×2 IMPLANT
KIT TURNOVER KIT B (KITS) ×2 IMPLANT
L-HOOK LAP DISP 36CM (ELECTROSURGICAL) ×2
LHOOK LAP DISP 36CM (ELECTROSURGICAL) ×1 IMPLANT
NS IRRIG 1000ML POUR BTL (IV SOLUTION) ×2 IMPLANT
PAD ARMBOARD 7.5X6 YLW CONV (MISCELLANEOUS) ×4 IMPLANT
PENCIL BUTTON HOLSTER BLD 10FT (ELECTRODE) ×2 IMPLANT
POUCH RETRIEVAL ECOSAC 10 (ENDOMECHANICALS) ×1 IMPLANT
POUCH RETRIEVAL ECOSAC 10MM (ENDOMECHANICALS) ×2
RELOAD ENDO STITCH 2.0 (ENDOMECHANICALS) ×2
RELOAD STAPLE 45 3.5 BLU ETS (ENDOMECHANICALS) ×1 IMPLANT
RELOAD STAPLE TA45 3.5 REG BLU (ENDOMECHANICALS) ×2 IMPLANT
RELOAD SUT SNGL STCH ABSRB 2-0 (ENDOMECHANICALS) IMPLANT
SCISSORS LAP 5X35 DISP (ENDOMECHANICALS) ×1 IMPLANT
SET IRRIG TUBING LAPAROSCOPIC (IRRIGATION / IRRIGATOR) ×2 IMPLANT
SET TUBE SMOKE EVAC HIGH FLOW (TUBING) ×2 IMPLANT
SHEARS HARMONIC HDI 36CM (ELECTROSURGICAL) ×2 IMPLANT
SLEEVE ENDOPATH XCEL 5M (ENDOMECHANICALS) ×3 IMPLANT
SPECIMEN JAR SMALL (MISCELLANEOUS) ×2 IMPLANT
STRIP CLOSURE SKIN 1/2X4 (GAUZE/BANDAGES/DRESSINGS) ×1 IMPLANT
SUT ETHILON 2 0 FS 18 (SUTURE) ×1 IMPLANT
SUT MNCRL AB 4-0 PS2 18 (SUTURE) ×2 IMPLANT
SUT RELOAD ENDO STITCH 2 48X1 (ENDOMECHANICALS) ×1
SUT VICRYL 0 UR6 27IN ABS (SUTURE) ×1 IMPLANT
SUTURE RELOAD END STTCH 2 48X1 (ENDOMECHANICALS) ×1 IMPLANT
TOWEL GREEN STERILE (TOWEL DISPOSABLE) ×2 IMPLANT
TOWEL GREEN STERILE FF (TOWEL DISPOSABLE) ×2 IMPLANT
TRAY FOLEY W/BAG SLVR 16FR (SET/KITS/TRAYS/PACK) ×2
TRAY FOLEY W/BAG SLVR 16FR ST (SET/KITS/TRAYS/PACK) ×1 IMPLANT
TRAY LAPAROSCOPIC MC (CUSTOM PROCEDURE TRAY) ×2 IMPLANT
TROCAR XCEL BLUNT TIP 100MML (ENDOMECHANICALS) ×2 IMPLANT
TROCAR XCEL NON-BLD 5MMX100MML (ENDOMECHANICALS) ×2 IMPLANT
WARMER LAPAROSCOPE (MISCELLANEOUS) ×2 IMPLANT
WATER STERILE IRR 1000ML POUR (IV SOLUTION) ×2 IMPLANT

## 2020-12-05 NOTE — Op Note (Signed)
Appendectomy, Lap, Procedure Note  Indications: The patient presented with a history of right-sided abdominal pain. A CT revealed findings consistent with acute appendicitis. He had a short delay in order to correct his INR.    Pre-operative Diagnosis: acute appendicitis  Post-operative Diagnosis: Same  Surgeon: Stark Klein   Assistants: Pryor Curia, RNFA  Anesthesia: General endotracheal anesthesia and Local anesthesia 1% plain lidocaine, 0.25.% bupivacaine, with epinephrine  ASA Class: 4, E  Procedure Details  The patient was seen again in the Holding Room. The risks, benefits, complications, treatment options, and expected outcomes were discussed with the patient and/or family. The possibilities of perforation of viscus, bleeding, recurrent infection, the need for additional procedures, failure to diagnose a condition, and creating a complication requiring transfusion or operation were discussed. There was concurrence with the proposed plan and informed consent was obtained. The site of surgery was properly noted. The patient was taken to Operating Room, identified as Anthony Gould and the procedure verified as laparoscopic appendectomy. A Time Out was held and the above information confirmed.  The patient was placed in the supine position and general anesthesia was induced, along with placement of orogastric tube, Venodyne boots, and a Foley catheter. The abdomen was prepped and draped in a sterile fashion. Local anesthetic was infiltrated in the infraumbilical region.  A 1.5 cm vertical incision was made just below the umbilicus.  The Kelly clamp was used to spread the subcutaneous tissues.  The fascia was elevated with 2 Kocher clamps and incised with the #11 blade.  A Claiborne Billings was used to confirm entrance into the peritoneal cavity.  A pursestring suture was placed around the fascial incision.  The Hasson trocar was inserted into the abdomen and held in place with the tails of the  suture.  The pneumoperitoneum was then established to steady pressure of 15 mmHg.     Additional 5 mm cannulas then placed in the left lower quadrant of the abdomen and the suprapubic region under direct visualization.  A careful evaluation of the entire abdomen was carried out. The patient was placed in Trendelenburg and rotated to the left.  The small intestines were stuck and required gentle blunt dissection to pull away from the appendix.  The small intestines were retracted in the cephalad and left lateral direction away from the pelvis and right lower quadrant. The patient was found to have an enlarged and inflamed appendix in the pelvic position. It was gangrenous.  The appendix was carefully dissected. The appendix was was skeletonized with the harmonic scalpel.   The appendix was friable at the base and just came off the cecum with clamping for gentle retraction. The cecum was too indurated to allow the stapler to pass around the end.  Minimal appendiceal stump was left in place. The appendix was removed from the abdomen with an Endocatch bag through the umbilical port.  A fourth port was placed in the LLQ. The site of the appendiceal orifice was sutured with a pursestring 2-0 vicryl using an Endostitch.  A drain was placed adjacent to the cecum.  This exits the LLQ.   There was no evidence of bleeding, leakage, or complication after division of the appendix. Irrigation was also performed and irrigate suctioned from the abdomen as well.  The 5 mm trocars were removed.  The pneumoperitoneum was evacuated from the abdomen.    The trocar site skin wounds were closed with 4-0 Monocryl and dressed with Dermabond.  Instrument, sponge, and needle counts were correct  at the conclusion of the case.   Findings: The appendix was found to be inflamed. There were signs of necrosis.  There was not perforation. There was not abscess formation, but there was a small amount of murky fluid.   Estimated Blood  Loss:  Minimal         Drains: 19 Fr blake drain placed in the right paracolic gutter exiting the LLQ.           Specimens: appendix to pathology         Complications:  None; patient tolerated the procedure well.         Disposition: PACU - hemodynamically stable.         Condition: stable

## 2020-12-05 NOTE — Progress Notes (Signed)
PROGRESS NOTE    Anthony Gould  [id number] DOB: 08-03-1933 DOA: 12/02/2020 PCP: Cassandria Anger, MD   Chief Complain: Abdominal pain, nausea, vomiting  Brief Narrative:  Patient is a 87 year old male with history of mechanical mitral valve replacement on Coumadin, permanent A. fib, pulmonary hypertension, TIA, hypertension, type of diabetes, CKD stage IIIa, hyperlipidemia who presented here with abdomen pain, vomiting, loose stools, decreased appetite.  CT imaging showed acute appendicitis.  Started on IV antibiotics, surgery consulted and the plan is for appendectomy today  Assessment & Plan:   Principal Problem:   Acute appendicitis Active Problems:   DM2 (diabetes mellitus, type 2) ([place])   Hyperlipidemia   Essential hypertension   Coronary atherosclerosis   Atrial fibrillation (HCC)   MITRAL VALVE REPLACEMENT, HX OF   Chronic diastolic heart failure (HCC)   CRI (chronic renal insufficiency), stage 3 (moderate) (HCC)   Acute appendicitis: Presented with nausea, vomiting, loose stools, low appetite.  CT abdomen /pelvis confirmed acute dermatitis present and surgery consulted.  Currently on Zosyn, gentle IV fluids.  Plan for appendicectomy today Currently abdomen pain is much improved.  Continue antiemetics for nausea/vomiting  Mechanical mitral valve replacement: On Coumadin at home.  Was given a dose of vitamin K on 9/10 to bring the INR down.  Currently on heparin drip.  Cardiology y was also consulted.  History of permanent A. fib: Currently rate is controlled.  Monitor telemetry.  On bisoprolol  History of diastolic congestive heart failure: Currently euvolemic.Takes lasix at home  History of liver cirrhosis: Currently stable.  Liver enzymes stable  History of CKD stage IIIa: Currently kidney function at baseline  History of coronary artery disease: Denies any anginal symptoms.  Takes aspirin, Lipitor, bisoprolol, enalapril  History of hypertension:  Currently blood pressure stable.  Home medications on hold             DVT prophylaxis:heparin IV Code Status: Full Family Communication: Wife at bedside Status is: Inpatient  Remains inpatient appropriate because:Inpatient level of care appropriate due to severity of illness  Dispo: The patient is from: Home              Anticipated d/c is to: Home              Patient currently is not medically stable to d/c.   Difficult to place patient No     Consultants: General surgery, cardiology  Procedures: Plan for appendicectomy  Antimicrobials:  Anti-infectives (From admission, onward)    Start     Dose/Rate Route Frequency Ordered Stop   12/03/20 0200  piperacillin-tazobactam (ZOSYN) IVPB 3.375 g        3.375 g 12.5 mL/hr over 240 Minutes Intravenous Every 8 hours 12/03/20 0121     12/03/20 0100  piperacillin-tazobactam (ZOSYN) IVPB 3.375 g  Status:  Discontinued        3.375 g 100 mL/hr over 30 Minutes Intravenous  Once 12/03/20 0047 12/03/20 0121       Subjective: Patient seen and examined at the bedside this morning.  Hemodynamically stable.  Wife at bedside.  Denies any abdominal pain today no nausea or vomiting.  Waiting for surgery  Objective: Vitals:   12/04/20 1751 12/04/20 1953 12/05/20 0450 12/05/20 0952  BP: 136/64 134/72 129/72 132/70  Pulse: 73 86 78 77  Resp: '16 17 17 18  '$ Temp: 98.3 F (36.8 C) 98.5 F (36.9 C) 97.9 F (36.6 C) (!) 97.5 F (36.4 C)  TempSrc: Oral Oral Oral Oral  SpO2: 98% 100% 100% 98%  Weight:      Height:        Intake/Output Summary (Last 24 hours) at 12/05/2020 1117 Last data filed at 12/05/2020 0900 Gross per 24 hour  Intake 1993.16 ml  Output --  Net 1993.16 ml   Filed Weights   12/03/20 1700  Weight: 74 kg    Examination:  General exam: Overall comfortable, not in distress, pleasant elderly male HEENT: PERRL Respiratory system:  no wheezes or crackles  Cardiovascular system: S1 & S2 heard, RRR.   Gastrointestinal system: Abdomen is nondistended, soft .  Tenderness on the right lower quadrant Central nervous system: Alert and oriented Extremities: No edema, no clubbing ,no cyanosis Skin: No rashes, no ulcers,no icterus      Data Reviewed: I have personally reviewed following labs and imaging studies  CBC: Recent Labs  Lab 12/02/20 1143 12/03/20 0147 12/04/20 0041 12/05/20 0857  WBC 11.9* 12.6* 9.3 6.2  NEUTROABS  --   --  7.4 4.2  HGB 11.9* 12.1* 11.9* 12.0*  HCT 35.7* 35.7* 34.1* 35.1*  MCV 100.2* 98.6 97.2 98.0  PLT 203.0 180 198 [id number]   Basic Metabolic Panel: Recent Labs  Lab 12/02/20 1143 12/03/20 0147 12/04/20 0041  NA 132* 129* 134*  K 4.3 3.8 3.8  CL 98 98 101  CO2 25 21* 22  GLUCOSE 116* 119* 86  BUN '18 17 16  '$ CREATININE 1.30 1.26* 1.09  CALCIUM 9.3 9.0 8.9   GFR: Estimated Creatinine Clearance: 51.2 mL/min (by C-G formula based on SCr of 1.09 mg/dL). Liver Function Tests: Recent Labs  Lab 12/02/20 1143 12/03/20 0147  AST 28 37  ALT 18 27  ALKPHOS 77 78  BILITOT 2.0* 1.9*  PROT 6.8 6.6  ALBUMIN 3.9 3.6   No results for input(s): LIPASE, AMYLASE in the last 168 hours. No results for input(s): AMMONIA in the last 168 hours. Coagulation Profile: Recent Labs  Lab 12/02/20 1415 12/03/20 0147 12/04/20 0041 12/05/20 0857  INR 3.3* 3.3* 1.7* 1.3*   Cardiac Enzymes: No results for input(s): CKTOTAL, CKMB, CKMBINDEX, TROPONINI in the last 168 hours. BNP (last 3 results) No results for input(s): PROBNP in the last 8760 hours. HbA1C: Recent Labs    12/03/20 1000  HGBA1C 5.3   CBG: Recent Labs  Lab 12/04/20 0512 12/04/20 1124 12/04/20 1750 12/04/20 2340 12/05/20 0601  GLUCAP 80 99 113* 113* 135*   Lipid Profile: No results for input(s): CHOL, HDL, LDLCALC, TRIG, CHOLHDL, LDLDIRECT in the last 72 hours. Thyroid Function Tests: No results for input(s): TSH, T4TOTAL, FREET4, T3FREE, THYROIDAB in the last 72 hours. Anemia Panel: No  results for input(s): VITAMINB12, FOLATE, FERRITIN, TIBC, IRON, RETICCTPCT in the last 72 hours. Sepsis Labs: No results for input(s): PROCALCITON, LATICACIDVEN in the last 168 hours.  Recent Results (from the past 240 hour(s))  Resp Panel by RT-PCR (Flu A&B, Covid) Nasopharyngeal Swab     Status: None   Collection Time: 12/02/20  7:15 PM   Specimen: Nasopharyngeal Swab; Nasopharyngeal(NP) swabs in vial transport medium  Result Value Ref Range Status   SARS Coronavirus 2 by RT PCR NEGATIVE NEGATIVE Final    Comment: (NOTE) SARS-CoV-2 target nucleic acids are NOT DETECTED.  The SARS-CoV-2 RNA is generally detectable in upper respiratory specimens during the acute phase of infection. The lowest concentration of SARS-CoV-2 viral copies this assay can detect is 138 copies/mL. A negative result does not preclude SARS-Cov-2 infection and should not be used as the sole  basis for treatment or other patient management decisions. A negative result may occur with  improper specimen collection/handling, submission of specimen other than nasopharyngeal swab, presence of viral mutation(s) within the areas targeted by this assay, and inadequate number of viral copies(<138 copies/mL). A negative result must be combined with clinical observations, patient history, and epidemiological information. The expected result is Negative.  Fact Sheet for Patients:  EntrepreneurPulse.com.au  Fact Sheet for Healthcare Providers:  IncredibleEmployment.be  This test is no t yet approved or cleared by the [place] FDA and  has been authorized for detection and/or diagnosis of SARS-CoV-2 by FDA under an Emergency Use Authorization (EUA). This EUA will remain  in effect (meaning this test can be used) for the duration of the COVID-19 declaration under Section 564(b)(1) of the Act, 21 U.S.C.section 360bbb-3(b)(1), unless the authorization is terminated  or revoked sooner.        Influenza A by PCR NEGATIVE NEGATIVE Final   Influenza B by PCR NEGATIVE NEGATIVE Final    Comment: (NOTE) The Xpert Xpress SARS-CoV-2/FLU/RSV plus assay is intended as an aid in the diagnosis of influenza from Nasopharyngeal swab specimens and should not be used as a sole basis for treatment. Nasal washings and aspirates are unacceptable for Xpert Xpress SARS-CoV-2/FLU/RSV testing.  Fact Sheet for Patients: EntrepreneurPulse.com.au  Fact Sheet for Healthcare Providers: IncredibleEmployment.be  This test is not yet approved or cleared by the [place] FDA and has been authorized for detection and/or diagnosis of SARS-CoV-2 by FDA under an Emergency Use Authorization (EUA). This EUA will remain in effect (meaning this test can be used) for the duration of the COVID-19 declaration under Section 564(b)(1) of the Act, 21 U.S.C. section 360bbb-3(b)(1), unless the authorization is terminated or revoked.  Performed at [facility], [place] [address] [place], [place] [address]   Surgical pcr screen     Status: None   Collection Time: 12/04/20  6:10 PM   Specimen: Nasal Mucosa; Nasal Swab  Result Value Ref Range Status   MRSA, PCR NEGATIVE NEGATIVE Final   Staphylococcus aureus NEGATIVE NEGATIVE Final    Comment: (NOTE) The Xpert SA Assay (FDA approved for NASAL specimens in patients 44 years of age and older), is one component of a comprehensive surveillance program. It is not intended to diagnose infection nor to guide or monitor treatment. Performed at [facility], [place] [address], [place], [place] [address]          Radiology Studies: No results found.      Scheduled Meds:  furosemide  40 mg Intravenous BID   metoprolol tartrate  5 mg Intravenous Q8H   Continuous Infusions:  dextrose 5 % and 0.9% NaCl 75 mL/hr at 12/05/20 0700   piperacillin-tazobactam 3.375 g (12/05/20 0928)     LOS: 2 days    Time  spent: 25 mins.More than 50% of that time was spent in counseling and/or coordination of care.      Shelly Coss, MD [facility] P9/02/2021, 11:17 AM

## 2020-12-05 NOTE — Interval H&P Note (Signed)
History and Physical Interval Note:  12/05/2020 12:48 PM  Anthony Gould  has presented today for surgery, with the diagnosis of Acute Appendicitis.  The various methods of treatment have been discussed with the patient and family. After consideration of risks, benefits and other options for treatment, the patient has consented to  Procedure(s): APPENDECTOMY LAPAROSCOPIC (N/A) as a surgical intervention.  The patient's history has been reviewed, patient examined, no change in status, stable for surgery.  I have reviewed the patient's chart and labs.  Questions were answered to the patient's satisfaction.     Stark Klein

## 2020-12-05 NOTE — Transfer of Care (Signed)
Immediate Anesthesia Transfer of Care Note  Patient: Anthony Gould  Procedure(s) Performed: APPENDECTOMY LAPAROSCOPIC (Abdomen)  Patient Location: PACU  Anesthesia Type:General  Level of Consciousness: awake, alert  and oriented  Airway & Oxygen Therapy: Patient Spontanous Breathing and Patient connected to nasal cannula oxygen  Post-op Assessment: Report given to RN and Post -op Vital signs reviewed and stable  Post vital signs: Reviewed and stable  Last Vitals:  Vitals Value Taken Time  BP 134/70 12/05/20 1623  Temp 37.1 C 12/05/20 1623  Pulse 71 12/05/20 1629  Resp 17 12/05/20 1629  SpO2 100 % 12/05/20 1629  Vitals shown include unvalidated device data.  Last Pain:  Vitals:   12/05/20 1623  TempSrc:   PainSc: 0-No pain         Complications: No notable events documented.

## 2020-12-05 NOTE — Anesthesia Preprocedure Evaluation (Addendum)
Anesthesia Evaluation  Patient identified by MRN, date of birth, ID band Patient awake    Reviewed: Allergy & Precautions, H&P , NPO status , Patient's Chart, lab work & pertinent test results, reviewed documented beta blocker date and time   Airway Mallampati: I  TM Distance: >3 FB Neck ROM: full    Dental no notable dental hx. (+) Missing, Caps, Poor Dentition   Pulmonary neg pulmonary ROS,    Pulmonary exam normal breath sounds clear to auscultation       Cardiovascular Exercise Tolerance: Good hypertension, Pt. on medications + CAD  + dysrhythmias Atrial Fibrillation + Valvular Problems/Murmurs MR  Rhythm:regular Rate:Normal  03/09/2019 ECHO 1. Left ventricular ejection fraction, by visual estimation, is 55 to  60%. The left ventricle has normal function. There is moderately increased  left ventricular hypertrophy.  2. Left ventricular diastolic function could not be evaluated.  3. The left ventricle has no regional wall motion abnormalities.  4. Global right ventricle has normal systolic function.The right  ventricular size is normal. No increase in right ventricular wall  thickness.  5. Left atrial size was severely dilated.  6. Right atrial size was severely dilated.  7. The mitral valve has been repaired/replaced. Trivial mitral valve  regurgitation.  8. The tricuspid valve is normal in structure. Tricuspid valve  regurgitation moderate.  9. The aortic valve is normal in structure. Aortic valve regurgitation is  not visualized. No evidence of aortic valve sclerosis or stenosis.  10. The pulmonic valve was grossly normal. Pulmonic valve regurgitation is  trivial.  11. Moderately elevated pulmonary artery systolic pressure.  12. The atrial septum is grossly normal.    Neuro/Psych TIACVA negative psych ROS   GI/Hepatic negative GI ROS, Neg liver ROS,   Endo/Other  negative endocrine ROSdiabetes, Type 2   Renal/GU CRFRenal disease  negative genitourinary   Musculoskeletal   Abdominal   Peds  Hematology negative hematology ROS (+)   Anesthesia Other Findings   Reproductive/Obstetrics negative OB ROS                            Anesthesia Physical Anesthesia Plan  ASA: 4 and emergent  Anesthesia Plan: General   Post-op Pain Management:    Induction: Intravenous and Cricoid pressure planned  PONV Risk Score and Plan: 2 and Ondansetron, Treatment may vary due to age or medical condition and Dexamethasone  Airway Management Planned: Oral ETT  Additional Equipment: None  Intra-op Plan:   Post-operative Plan:   Informed Consent: I have reviewed the patients History and Physical, chart, labs and discussed the procedure including the risks, benefits and alternatives for the proposed anesthesia with the patient or authorized representative who has indicated his/her understanding and acceptance.     Dental Advisory Given  Plan Discussed with: CRNA and Anesthesiologist  Anesthesia Plan Comments: (HPI: Anthony Gould is a 87 y.o. male with medical history significant of mitral regurgitation with a history of Medtronic-Hall mitral valve replacement in 1986, permanent atrial fibrillation, pulmonary hypertension, NSVT, history of ventricular ectopy, history of TIA, syncopal episode, hypertension, type II DM, stage IIIa CKD, elevated TSH, hypertension, hyperlipidemia is coming to the emergency department due to abdominal pain associated with emesis, loose stools, decreased appetite and malaise since Wednesday.  He denied melena or hematochezia.  No dysuria, frequency or hematuria.  He denied fever, chills, night sweats, rhinorrhea, sore throat, wheezing, dyspnea or hemoptysis.  No chest pain, palpitations, diaphoresis, PND, orthopnea  or recent pitting edema of the lower extremities.  No polyuria, polydipsia, polyphagia or blurred vision.)       Anesthesia  Quick Evaluation

## 2020-12-05 NOTE — Telephone Encounter (Signed)
[facility] FYI  Caller states patient was sent to ER from the doctor's office with Dx of appendicitis and was told to go to ER. Caller states has been there since 5:30pm and is still in a waiting room, have not been seen. Patient is having dull pain and temp of 99.4. Patient is currently in ER. From clinical experience advise caller he can ask to speak to ER charge nurse or nursing supervisor for more information, he verbalized understanding.

## 2020-12-05 NOTE — Progress Notes (Signed)
[facility] for Heparin Indication: A.Fib  Allergies  Allergen Reactions   Cardizem [Diltiazem]     Bradycardia per pt Doing well on Amlodipine   Sulfonamide Derivatives     Unknown     Patient Measurements: Height: '5\' 10"'$  (177.8 cm) Weight: 74 kg (163 lb 2.3 oz) IBW/kg (Calculated) : 73 Heparin Dosing Weight: 76.5  Vital Signs: Temp: 98.7 F (37.1 C) (09/12 1723) Temp Source: Oral (09/12 1323) BP: 138/66 (09/12 1723) Pulse Rate: 80 (09/12 1723)  Labs: Recent Labs    12/03/20 0147 12/04/20 0041 12/04/20 1652 12/05/20 0857  HGB 12.1* 11.9*  --  12.0*  HCT 35.7* 34.1*  --  35.1*  PLT 180 198  --  230  APTT  --   --  55*  --   LABPROT 33.8* 19.5*  --  15.7*  INR 3.3* 1.7*  --  1.3*  HEPARINUNFRC  --   --  0.17* <0.10*  CREATININE 1.26* 1.09  --   --      Estimated Creatinine Clearance: 51.2 mL/min (by C-G formula based on SCr of 1.09 mg/dL).  Medications:  Scheduled:   fentaNYL       metoprolol tartrate  5 mg Intravenous Q8H    Assessment: 87 year old male with a history of  mechanical mitral valve and atrial fibrillation. On coumadin PTA, seen on 9/40 for preop evaluation for acute appendicitis. INR on 9/10 is 3.3. Vit K '2mg'$  x1 dose given.  Heparin to resume at 10 pm  Goal of Therapy:  Heparin level 0.3-0.7 units/ml aPTT 66-102 seconds Monitor platelets by anticoagulation protocol: Yes   Plan:  Heparin at 1350 units / hr Heparin level 8 hours later  Thank you Anette Guarneri, PharmD   12/05/2020 5:43 PM   [facility] pharmacy phone numbers are listed on [email or web address]

## 2020-12-05 NOTE — Anesthesia Procedure Notes (Signed)
Procedure Name: Intubation Date/Time: 12/05/2020 2:10 PM Performed by: Jearld Pies, CRNA Pre-anesthesia Checklist: Patient identified, Emergency Drugs available, Suction available and Patient being monitored Patient Re-evaluated:Patient Re-evaluated prior to induction Oxygen Delivery Method: Circle System Utilized Preoxygenation: Pre-oxygenation with 100% oxygen Induction Type: IV induction and Cricoid Pressure applied Ventilation: Mask ventilation without difficulty Laryngoscope Size: Miller and 2 Grade View: Grade I Tube type: Oral Tube size: 7.5 mm Number of attempts: 1 Airway Equipment and Method: Stylet Placement Confirmation: ETT inserted through vocal cords under direct vision, positive ETCO2 and breath sounds checked- equal and bilateral Secured at: 21 cm Tube secured with: Tape Dental Injury: Teeth and Oropharynx as per pre-operative assessment

## 2020-12-06 ENCOUNTER — Encounter (HOSPITAL_COMMUNITY): Payer: Self-pay | Admitting: General Surgery

## 2020-12-06 DIAGNOSIS — I10 Essential (primary) hypertension: Secondary | ICD-10-CM

## 2020-12-06 DIAGNOSIS — Z952 Presence of prosthetic heart valve: Secondary | ICD-10-CM | POA: Diagnosis not present

## 2020-12-06 DIAGNOSIS — E785 Hyperlipidemia, unspecified: Secondary | ICD-10-CM

## 2020-12-06 DIAGNOSIS — K3580 Unspecified acute appendicitis: Secondary | ICD-10-CM | POA: Diagnosis not present

## 2020-12-06 DIAGNOSIS — I4821 Permanent atrial fibrillation: Secondary | ICD-10-CM | POA: Diagnosis not present

## 2020-12-06 LAB — CBC WITH DIFFERENTIAL/PLATELET
Abs Immature Granulocytes: 0.04 10*3/uL (ref 0.00–0.07)
Basophils Absolute: 0 10*3/uL (ref 0.0–0.1)
Basophils Relative: 0 %
Eosinophils Absolute: 0 10*3/uL (ref 0.0–0.5)
Eosinophils Relative: 0 %
HCT: 33.2 % — ABNORMAL LOW (ref 39.0–52.0)
Hemoglobin: 11.5 g/dL — ABNORMAL LOW (ref 13.0–17.0)
Immature Granulocytes: 1 %
Lymphocytes Relative: 12 %
Lymphs Abs: 0.7 10*3/uL (ref 0.7–4.0)
MCH: 33.2 pg (ref 26.0–34.0)
MCHC: 34.6 g/dL (ref 30.0–36.0)
MCV: 96 fL (ref 80.0–100.0)
Monocytes Absolute: 0.2 10*3/uL (ref 0.1–1.0)
Monocytes Relative: 3 %
Neutro Abs: 4.5 10*3/uL (ref 1.7–7.7)
Neutrophils Relative %: 84 %
Platelets: 202 10*3/uL (ref 150–400)
RBC: 3.46 MIL/uL — ABNORMAL LOW (ref 4.22–5.81)
RDW: 13.6 % (ref 11.5–15.5)
WBC: 5.3 10*3/uL (ref 4.0–10.5)
nRBC: 0 % (ref 0.0–0.2)

## 2020-12-06 LAB — BASIC METABOLIC PANEL
Anion gap: 10 (ref 5–15)
BUN: 12 mg/dL (ref 8–23)
CO2: 28 mmol/L (ref 22–32)
Calcium: 8.3 mg/dL — ABNORMAL LOW (ref 8.9–10.3)
Chloride: 99 mmol/L (ref 98–111)
Creatinine, Ser: 1.23 mg/dL (ref 0.61–1.24)
GFR, Estimated: 58 mL/min — ABNORMAL LOW (ref >60)
Glucose, Bld: 202 mg/dL — ABNORMAL HIGH (ref 70–99)
Potassium: 3 mmol/L — ABNORMAL LOW (ref 3.5–5.1)
Sodium: 137 mmol/L (ref 135–145)

## 2020-12-06 LAB — MAGNESIUM: Magnesium: 1.3 mg/dL — ABNORMAL LOW (ref 1.7–2.4)

## 2020-12-06 LAB — HEPARIN LEVEL (UNFRACTIONATED)
Heparin Unfractionated: 0.37 IU/mL (ref 0.30–0.70)
Heparin Unfractionated: 0.47 IU/mL (ref 0.30–0.70)

## 2020-12-06 LAB — PROTIME-INR
INR: 1.4 — ABNORMAL HIGH (ref 0.8–1.2)
Prothrombin Time: 17.4 seconds — ABNORMAL HIGH (ref 11.4–15.2)

## 2020-12-06 LAB — GLUCOSE, CAPILLARY
Glucose-Capillary: 176 mg/dL — ABNORMAL HIGH (ref 70–99)
Glucose-Capillary: 152 mg/dL — ABNORMAL HIGH (ref 70–99)
Glucose-Capillary: 129 mg/dL — ABNORMAL HIGH (ref 70–99)

## 2020-12-06 LAB — SURGICAL PATHOLOGY

## 2020-12-06 MED ORDER — BISOPROLOL FUMARATE 5 MG PO TABS
2.5000 mg | ORAL_TABLET | Freq: Every day | ORAL | Status: DC
  Administered 2020-12-07 – 2020-12-10 (×4): 2.5 mg via ORAL
  Filled 2020-12-06 (×4): qty 1

## 2020-12-06 MED ORDER — BOOST / RESOURCE BREEZE PO LIQD CUSTOM
1.0000 | Freq: Three times a day (TID) | ORAL | Status: DC
  Administered 2020-12-06 – 2020-12-07 (×3): 1 via ORAL
  Filled 2020-12-06: qty 1

## 2020-12-06 MED ORDER — POTASSIUM CHLORIDE CRYS ER 20 MEQ PO TBCR
40.0000 meq | EXTENDED_RELEASE_TABLET | Freq: Two times a day (BID) | ORAL | Status: AC
  Administered 2020-12-06 (×2): 40 meq via ORAL
  Filled 2020-12-06 (×2): qty 2

## 2020-12-06 MED ORDER — OXYCODONE HCL 5 MG PO TABS: 2.5000 mg | ORAL_TABLET | ORAL | Status: DC | PRN

## 2020-12-06 MED ORDER — ALLOPURINOL 300 MG PO TABS
300.0000 mg | ORAL_TABLET | Freq: Every day | ORAL | Status: DC
  Administered 2020-12-07 – 2020-12-18 (×12): 300 mg via ORAL
  Filled 2020-12-06 (×12): qty 1

## 2020-12-06 MED ORDER — LATANOPROST 0.005 % OP SOLN
1.0000 [drp] | Freq: Every day | OPHTHALMIC | Status: DC
  Administered 2020-12-06 – 2020-12-17 (×12): 1 [drp] via OPHTHALMIC
  Filled 2020-12-06: qty 2.5

## 2020-12-06 MED ORDER — HYDROMORPHONE HCL 1 MG/ML IJ SOLN: 0.5000 mg | INTRAMUSCULAR | Status: DC | PRN

## 2020-12-06 MED ORDER — ATORVASTATIN CALCIUM 10 MG PO TABS
20.0000 mg | ORAL_TABLET | ORAL | Status: DC
  Administered 2020-12-07 – 2020-12-17 (×4): 20 mg via ORAL
  Filled 2020-12-06 (×5): qty 2

## 2020-12-06 NOTE — Progress Notes (Signed)
ANTICOAGULATION CONSULT NOTE - Follow Up Consult  Pharmacy Consult for Heparin Indication: mechanical valve and atrial fibrillation  Allergies  Allergen Reactions   Cardizem [Diltiazem]     Bradycardia per pt Doing well on Amlodipine   Sulfonamide Derivatives     Unknown     Patient Measurements: Height: '5\' 10"'$  (177.8 cm) Weight: 74 kg (163 lb 2.3 oz) IBW/kg (Calculated) : 73 Heparin Dosing Weight: 74kg  Vital Signs: Temp: 98.2 F (36.8 C) (09/13 1201) Temp Source: Oral (09/13 1201) BP: 138/69 (09/13 1201) Pulse Rate: 70 (09/13 1201)  Labs: Recent Labs    12/04/20 0041 12/04/20 1652 12/04/20 1652 12/05/20 0857 12/06/20 0544 12/06/20 1133  HGB 11.9*  --   --  12.0* 11.5*  --   HCT 34.1*  --   --  35.1* 33.2*  --   PLT 198  --   --  230 202  --   APTT  --  55*  --   --   --   --   LABPROT 19.5*  --   --  15.7* 17.4*  --   INR 1.7*  --   --  1.3* 1.4*  --   HEPARINUNFRC  --  0.17*   < > <0.10* 0.37 0.47  CREATININE 1.09  --   --   --  1.23  --    < > = values in this interval not displayed.    Estimated Creatinine Clearance: 45.3 mL/min (by C-G formula based on SCr of 1.23 mg/dL).  Assessment:  Anticoag: On coumadin PTA for mechanical valve and atrial fibrillation Consulted to start heparin drip when INR <2.5 -9/10: VitK '2mg'$  x1  - Hep level 0.37, confirmed 0.47 in goal. INR down to 1.4. Hgb 11.5 stable. Plts 202 WNL.  Goal of Therapy:  Heparin level 0.3-0.7 units/ml Monitor platelets by anticoagulation protocol: Yes   Plan:  Con't Heparin 1350 units/hr Will need to restart home meds after surgical interventions May be able to resume Coumadin today.    Tanji Storrs S. Alford Highland, PharmD, BCPS Clinical Staff Pharmacist [email or web address]  Eilene Ghazi Stillinger 12/06/2020,12:41 PM

## 2020-12-06 NOTE — Progress Notes (Signed)
Progress Note  Patient Name: Anthony Gould Date of Encounter: 12/06/2020  [facility] Cardiologist: Lauree Chandler, MD   Subjective   Patient denied any chest pain, SOB, leg swelling. He endorses pain if his abdomen from surgical site. He wishes to go home soon but family felt he is not ready and family does not feel comfortable taking care of him like this. He is not yet passing gas, did eat breakfast and was able to tolerate.   Inpatient Medications    Scheduled Meds:  [START ON 12/07/2020] bisoprolol  2.5 mg Oral Daily   feeding supplement  1 Container Oral TID BM   potassium chloride  40 mEq Oral BID   Continuous Infusions:  heparin 1,350 Units/hr (12/05/20 2306)   piperacillin-tazobactam 3.375 g (12/06/20 1016)   PRN Meds: acetaminophen **OR** acetaminophen, HYDROmorphone (DILAUDID) injection, ondansetron **OR** ondansetron (ZOFRAN) IV, oxyCODONE   Vital Signs    Vitals:   12/05/20 2023 12/05/20 2316 12/06/20 0514 12/06/20 0806  BP: 134/72 (!) 147/68 132/65 133/62  Pulse: 82 71 64 68  Resp:    18  Temp: 97.7 F (36.5 C) 98.2 F (36.8 C) 97.7 F (36.5 C) 97.8 F (36.6 C)  TempSrc: Oral Oral Oral Oral  SpO2: 100% 98% 99% 98%  Weight:      Height:        Intake/Output Summary (Last 24 hours) at 12/06/2020 1142 Last data filed at 12/06/2020 1018 Gross per 24 hour  Intake 2510.73 ml  Output 750 ml  Net 1760.73 ml   Last 3 Weights 12/05/2020 12/03/2020 12/02/2020  Weight (lbs) 163 lb 2.3 oz 163 lb 2.3 oz 168 lb 9.6 oz  Weight (kg) 74 kg 74 kg 76.476 kg      Telemetry    Afib with rate of 80-100s, PVCs, NSVT up to 4 runs noted - Personally Reviewed  ECG    N/A this AM  - Personally Reviewed  Physical Exam   GEN: No acute distress.   Neck: no JVD Cardiac: Irregularly irregular, no murmurs, rubs, or gallops.  Respiratory: Clear to auscultation bilaterally. On room air.  GI: Soft, JP drain with serosanguinous output noted  MS: No BLE edema; No  deformity. Neuro:  Nonfocal  Psych: Normal affect   Labs    High Sensitivity Troponin:  No results for input(s): TROPONINIHS in the last 720 hours.    Chemistry Recent Labs  Lab 12/02/20 1143 12/03/20 0147 12/04/20 0041 12/06/20 0544  NA 132* 129* 134* 137  K 4.3 3.8 3.8 3.0*  CL 98 98 101 99  CO2 25 21* 22 28  GLUCOSE 116* 119* 86 202*  BUN '18 17 16 12  '$ CREATININE 1.30 1.26* 1.09 1.23  CALCIUM 9.3 9.0 8.9 8.3*  PROT 6.8 6.6  --   --   ALBUMIN 3.9 3.6  --   --   AST 28 37  --   --   ALT 18 27  --   --   ALKPHOS 77 78  --   --   BILITOT 2.0* 1.9*  --   --   GFRNONAA  --  56* >60 58*  ANIONGAP  --  '10 11 10     '$ Hematology Recent Labs  Lab 12/04/20 0041 12/05/20 0857 12/06/20 0544  WBC 9.3 6.2 5.3  RBC 3.51* 3.58* 3.46*  HGB 11.9* 12.0* 11.5*  HCT 34.1* 35.1* 33.2*  MCV 97.2 98.0 96.0  MCH 33.9 33.5 33.2  MCHC 34.9 34.2 34.6  RDW 14.0  13.6 13.6  PLT 198 230 202    BNPNo results for input(s): BNP, PROBNP in the last 168 hours.   DDimer No results for input(s): DDIMER in the last 168 hours.   Radiology    No results found.  Cardiac Studies   Echo from 03/09/2019:   1. Left ventricular ejection fraction, by visual estimation, is 55 to  60%. The left ventricle has normal function. There is moderately increased  left ventricular hypertrophy.   2. Left ventricular diastolic function could not be evaluated.   3. The left ventricle has no regional wall motion abnormalities.   4. Global right ventricle has normal systolic function.The right  ventricular size is normal. No increase in right ventricular wall  thickness.   5. Left atrial size was severely dilated.   6. Right atrial size was severely dilated.   7. The mitral valve has been repaired/replaced. Trivial mitral valve  regurgitation.   8. The tricuspid valve is normal in structure. Tricuspid valve  regurgitation moderate.   9. The aortic valve is normal in structure. Aortic valve regurgitation is   not visualized. No evidence of aortic valve sclerosis or stenosis.  10. The pulmonic valve was grossly normal. Pulmonic valve regurgitation is  trivial.  11. Moderately elevated pulmonary artery systolic pressure.  12. The atrial septum is grossly normal.    MYOVIEW: 02/12/2013 Exercise Capacity: Fair exercise capacity. BP Response: Normal blood pressure response. Clinical Symptoms: No chest pain ECG Impression: No significant ST segment change suggestive of ischemia; patient in atrial fibrillation during the study; frequent PVCs and nonsustained VT. Overall Impression: Abnormal stress nuclear study with no ischemia or infarction; reduced LV function.  Patient Profile     87 y.o. male with PMH of severe mitral regurgitation s/p Medtronic-Hall mitral valve replacement in 1986 on chronic coumadin therapy, permanent atrial fibrillation, pulmonary hypertension, HTN, HLD, NSVT, history of ventricular ectopy, history of TIA, syncopal episode, type II DM, stage IIIa CKD, former ETOH abuse, cirrhosis, who is admitted for appendicitis. Cardiology was consulted for pre-op evaluation on 12/03/20. His RCRI score is 1 giving him a 0.9% perioperative risk of major cardiovascular event. His DASI score is 15.45 giving him a functional capacity in METS of 4.64. He was felt at acceptable risk for the planned surgery without additional cardiac testing.   Assessment & Plan    Severe mitral regurgitation s/p Medtronic-Hall mechanical MVR in 1986  Chronic anticoagulation with coumadin - INR 3.3 POA, reversed by vitamin K '2mg'$  x1 on 12/03/20, anticoagulated with heparin gtt before OR, heparin gtt held pre-op,  gtt resumed today per surgery, if no bleeding complication, would transition back to coumadin with Lovenox bridge, goal of INR 3-3.5 (hx of TIAs) - discussed with the patient to monitor bleeding closely   Permanent atrial fibrillation NSVT - on bisoprolol 2.'5mg'$  daily at home, tolerating PO intake now, will  stop IV metoprolol and start PO bisoprolol 2.'5mg'$   today  - rate is controlled at this time, up to 4 runs NSVT noted on telemetry  - anticoagulation plan as above  - keep K >4 and Mag >2 , replete for hypokalemia, check Mag today   Chronic diastolic heart failure  - Echo from 02/2019 with EF 55-60%, severe bil-atrial dilation, trivial MR and TR, moderate elevated PSAP with RVSP 52.1 mmHg - clinically euvolemic today, resumed bisoprolol today, may resume Lasix '40mg'$  daily when PO intake is adequate   Acute appendicitis  - s/p appendectomy on 12/05/20 by Dr. Barry Dienes ,  post-op care per surgery   HTN - BP is controlled, on amlodipine 5 mg daily, bisoprolol 2.'5mg'$  daily, enalapril 20 mg daily, and Lasix '40mg'$  daily at home, resumed bisoprolol today, may add back amlodipine and enalapril and Lasix if BP rising and PO intake adequate   HLD -Home atorvastatin 20 mg held, may resume when PO intake well   CKD stage III Hypokalemia  - per primary team     For questions or updates, please contact [facility] Please consult [email or web address] for contact info under        Signed, Margie Billet, NP  12/06/2020, 11:42 AM

## 2020-12-06 NOTE — Care Management Important Message (Signed)
Important Message  Patient Details  Name: Anthony Gould MRN: [id number] Date of Birth: 13-Apr-1933   Medicare Important Message Given:  Yes     Concettina Leth [address] 12/06/2020, 3:40 PM

## 2020-12-06 NOTE — Progress Notes (Signed)
[facility] Progress Note  1 Day Post-Op  Subjective: CC-  Comfortable this morning. Abdomen still sore on the right as before surgery. Denies bloating, nausea, vomiting. BM yesterday before surgery, no flatus. Has gotten up to the restroom. Tolerated pudding and coffee for breakfast. Not requiring any pain medication.  Objective: Vital signs in last 24 hours: Temp:  [97.7 F (36.5 C)-98.7 F (37.1 C)] 97.8 F (36.6 C) (09/13 0806) Pulse Rate:  [64-88] 68 (09/13 0806) Resp:  [12-18] 18 (09/13 0806) BP: (125-153)/(61-73) 133/62 (09/13 0806) SpO2:  [98 %-100 %] 98 % (09/13 0806) Weight:  [74 kg] 74 kg (09/12 1323) Last BM Date: 12/03/20  Intake/Output from previous day: 09/12 0701 - 09/13 0700 In: 1634.6 [I.V.:1441.1; IV Piggyback:193.5] Out: 710 [Urine:700; Blood:10] Intake/Output this shift: Total I/O In: 180 [P.O.:180] Out: 40 [Drains:40]  PE: Gen:  Alert, NAD, pleasant Pulm: rate and effort normal Abd: Soft, minimal distension, few bowel sounds heard, minimal RLQ TTP without rebound or guarding otherwise nontender, lap incisions cdi, drain output SS  Lab Results:  Recent Labs    12/05/20 0857 12/06/20 0544  WBC 6.2 5.3  HGB 12.0* 11.5*  HCT 35.1* 33.2*  PLT 230 202   BMET Recent Labs    12/04/20 0041 12/06/20 0544  NA 134* 137  K 3.8 3.0*  CL 101 99  CO2 22 28  GLUCOSE 86 202*  BUN 16 12  CREATININE 1.09 1.23  CALCIUM 8.9 8.3*   PT/INR Recent Labs    12/05/20 0857 12/06/20 0544  LABPROT 15.7* 17.4*  INR 1.3* 1.4*   CMP     Component Value Date/Time   NA 137 12/06/2020 0544   K 3.0 (L) 12/06/2020 0544   CL 99 12/06/2020 0544   CO2 28 12/06/2020 0544   GLUCOSE 202 (H) 12/06/2020 0544   BUN 12 12/06/2020 0544   CREATININE 1.23 12/06/2020 0544   CREATININE 1.40 (H) 04/01/2015 1540   CALCIUM 8.3 (L) 12/06/2020 0544   PROT 6.6 12/03/2020 0147   ALBUMIN 3.6 12/03/2020 0147   AST 37 12/03/2020 0147   ALT 27 12/03/2020 0147    ALKPHOS 78 12/03/2020 0147   BILITOT 1.9 (H) 12/03/2020 0147   GFRNONAA 58 (L) 12/06/2020 0544   GFRAA 107 11/17/2007 1231   Lipase     Component Value Date/Time   LIPASE 15.0 12/18/2017 0828       Studies/Results: No results found.  Anti-infectives: Anti-infectives (From admission, onward)    Start     Dose/Rate Route Frequency Ordered Stop   12/03/20 0200  piperacillin-tazobactam (ZOSYN) IVPB 3.375 g        3.375 g 12.5 mL/hr over 240 Minutes Intravenous Every 8 hours 12/03/20 0121     12/03/20 0100  piperacillin-tazobactam (ZOSYN) IVPB 3.375 g  Status:  Discontinued        3.375 g 100 mL/hr over 30 Minutes Intravenous  Once 12/03/20 0047 12/03/20 0121        Assessment/Plan POD#1 Laparoscopic appendectomy for Acute appendicitis 9/12 Dr. Barry Dienes  - appendiceal orifice was sutured with a pursestring 2-0 vicryl using an Endostitch, drain was placed adjacent to the cecum - Advance to regular diet, add Boost Breeze - mobilize, PT consult - continue IV antibiotics - continue JP drain - currently serosanguinous - will discuss with MD when to restart coumadin  ID - zosyn 9/10>> FEN - d/c IVF, regular diet VTE - IV heparin Foley - none  Mechanical mitral valve replacement on coumadin - continue  IV heparin Permanent A Fib HTN CAD CKD IIIa CHF Cirrhosis   LOS: 3 days    Wellington Hampshire, [facility] Surgery 12/06/2020, 10:16 AM Please see Amion for pager number during day hours 7:00am-4:30pm

## 2020-12-06 NOTE — Evaluation (Signed)
Physical Therapy Evaluation Patient Details Name: Anthony Gould MRN: [id number] DOB: April 29, 1933 Today's Date: 12/06/2020  History of Present Illness  Pt is 87 yo male who presented on 12/02/20 with abdominal pain and found to have appendicitis.  He is s/p appendectomy on 12/05/20. Pt with hx of pAF on warfarin, mechanical mitral valve, CKD IIIa, pHTN, HTN, and TIA  Clinical Impression   Pt admitted with above diagnosis.  At baseline, he is independent and active 88 yo.  Pt lives with wife in 2 level home but has stair lift if needed.  At evaluation, pt transferring with supervision and able to ambulate 300' in hall without AD and perform 3 steps safely.  He has been ambulating in hallway with his wife earlier today.  Pt demonstrates safe mobility - no further PT needs. Safe to continue mobility with family.        Recommendations for follow up therapy are one component of a multi-disciplinary discharge planning process, led by the attending physician.  Recommendations may be updated based on patient status, additional functional criteria and insurance authorization.  Follow Up Recommendations No PT follow up    Equipment Recommendations  None recommended by PT    Recommendations for Other Services       Precautions / Restrictions Precautions Precautions: Other (comment) Precaution Comments: JP drain      Mobility  Bed Mobility Overal bed mobility: Needs Assistance Bed Mobility: Supine to Sit;Sit to Supine     Supine to sit: Supervision Sit to supine: Supervision   General bed mobility comments: Pt standing upon arrival    Transfers Overall transfer level: Needs assistance Equipment used: None Transfers: Sit to/from Stand Sit to Stand: Supervision            Ambulation/Gait Ambulation/Gait assistance: Supervision Gait Distance (Feet): 300 Feet Assistive device: None Gait Pattern/deviations: WFL(Within Functional Limits) Gait velocity: decreased   General Gait  Details: Demonstrated safe steady gait at slightly decreased speed.  Pt ambulated the halls earlier with wife.  Stairs Stairs: Yes Stairs assistance: Min guard Stair Management: One rail Left;Alternating pattern;Forwards Number of Stairs: 3 General stair comments: min guard for safety; limited # steps due to IV line  Wheelchair Mobility    Modified Rankin (Stroke Patients Only)       Balance Overall balance assessment: Independent Sitting-balance support: No upper extremity supported Sitting balance-Leahy Scale: Normal     Standing balance support: No upper extremity supported Standing balance-Leahy Scale: Good                               Pertinent Vitals/Pain Pain Assessment: 0-10 Pain Score: 1  Pain Location: abdomen Pain Descriptors / Indicators: Sore Pain Intervention(s): Limited activity within patient's tolerance;Monitored during session    Home Living Family/patient expects to be discharged to:: Private residence Living Arrangements: Spouse/significant other Available Help at Discharge: Family;Available 24 hours/day Type of Home: House Home Access: Level entry     Home Layout: Two level;Bed/bath upstairs Home Equipment: Cane - single point;Walker - 2 wheels;Shower seat Additional Comments: does not use DME; also has stair lift if needed    Prior Function Level of Independence: Independent         Comments: Pt completely independent with driving, IADLs, ADLs, and community ambulation     Hand Dominance        Extremity/Trunk Assessment   Upper Extremity Assessment Upper Extremity Assessment: Overall WFL for tasks assessed  Lower Extremity Assessment Lower Extremity Assessment: Overall WFL for tasks assessed    Cervical / Trunk Assessment Cervical / Trunk Assessment: Normal  Communication   Communication: HOH  Cognition Arousal/Alertness: Awake/alert Behavior During Therapy: WFL for tasks assessed/performed Overall  Cognitive Status: Within Functional Limits for tasks assessed                                        General Comments General comments (skin integrity, edema, etc.): VSS    Exercises     Assessment/Plan    PT Assessment Patent does not need any further PT services  PT Problem List         PT Treatment Interventions      PT Goals (Current goals can be found in the Care Plan section)  Acute Rehab PT Goals Patient Stated Goal: return home PT Goal Formulation: All assessment and education complete, DC therapy    Frequency     Barriers to discharge        Co-evaluation               AM-PAC PT "6 Clicks" Mobility  Outcome Measure Help needed turning from your back to your side while in a flat bed without using bedrails?: None Help needed moving from lying on your back to sitting on the side of a flat bed without using bedrails?: None Help needed moving to and from a bed to a chair (including a wheelchair)?: A Little Help needed standing up from a chair using your arms (e.g., wheelchair or bedside chair)?: A Little Help needed to walk in hospital room?: A Little Help needed climbing 3-5 steps with a railing? : A Little 6 Click Score: 20    End of Session   Activity Tolerance: Patient tolerated treatment well Patient left: in bed;with call bell/phone within reach;with family/visitor present (no alarm - pt mobilizing on his own) Nurse Communication: Mobility status  JP drain was unpinned from gown once in bed.       Time: [id number] PT Time Calculation (min) (ACUTE ONLY): 18 min   Charges:   PT Evaluation $PT Eval Low Complexity: 1 Low          Christin Mccreedy, PT Acute Rehab Services Pager [phone] [facility] [phone]   Karlton Lemon 12/06/2020, 5:15 PM

## 2020-12-06 NOTE — Anesthesia Postprocedure Evaluation (Signed)
Anesthesia Post Note  Patient: Anthony Gould  Procedure(s) Performed: APPENDECTOMY LAPAROSCOPIC (Abdomen)     Patient location during evaluation: PACU Anesthesia Type: General Level of consciousness: awake and alert and oriented Pain management: pain level controlled Vital Signs Assessment: post-procedure vital signs reviewed and stable Respiratory status: spontaneous breathing, nonlabored ventilation, respiratory function stable and patient connected to nasal cannula oxygen Cardiovascular status: blood pressure returned to baseline and stable Postop Assessment: no apparent nausea or vomiting Anesthetic complications: no   No notable events documented.                Konstantinos Cordoba A.

## 2020-12-06 NOTE — Progress Notes (Signed)
[facility] PROGRESS NOTE    [name]  [id number] DOB: Sep 23, 1933 DOA: 12/02/2020 PCP: Cassandria Anger, MD      Brief Narrative:  Anthony Gould is a 87 y.o. M with pAF on warfarin, hx mech mitral valve (Medtronic [place] titanium in 1986), CKD IIIa, pHTN, HTN, hx TIA who presented to PCP with RLQ pain, fever and reduced appetite, sent to ER.  In the ER, CT confirmed acute appendicitis.  Started on heparin and admitted.         Assessment & Plan:  Acute appendicitis S/P appendectomy 9/12 by Dr. Tilda Franco to be doing well.  No post op complications - Post-op care per Gen Surg - Antibiotics per surgery, would recommend narrowing to non-pseudomonal coverage    Acquired thrombophilia Medtronic-[place] mechanical valve Permanent Atrial fibrillation Plan for Lovenox bridge at d/c.  Until Gen Surg are clearer with their recommendations, I will maintain on heparin -Continue heparin -Hold warfarin   Diabetes Diet controlled.  He has a chart history of DM, and lots of A1c's in the last, all of which are normal.  I assume this is resolved diabetes.  Here his sugars are fine. -Hold aspirin - Resume atorvastatin  CKD stage IIIa Cr at baseline  Hypertension BP controlled - Continue bisoprolol -Hold amlodipine, enalapril  Other medications -Hold dipyridamole, indication unclear, last Cards note 12/08/19 says "no prior history of coronary disease in his chart"  Pulmonary hypertension -Hold Lasix  Hypokalemia - Supplement potassium  Hyponatremia Resolved post-op  Gout - Resume allopurinol  GERD -Hold pantoprazole as not symptomatic                Disposition: Status is: Inpatient  Remains inpatient appropriate because:Inpatient level of care appropriate due to severity of illness  Dispo: The patient is from: Home              Anticipated d/c is to: Home              Patient currently is not medically stable to  d/c.   Difficult to place patient No       Level of care: Telemetry Medical       MDM: The below labs and imaging reports were reviewed and summarized above.  Medication management as above.      DVT prophylaxis: Place and maintain sequential compression device Start: 12/06/20 1024  Code Status: FULL Family Communication: Wife    Consultants:  Gen Surg Cardiology  Procedures:  9/12 appendectomy  Antimicrobials:  Zosyn   Culture data:             Subjective: Patient is a little sore, but no headache, chest pain, dyspnea, orthopnea, swelling, focal weakness or numbness, confusion, loss of consciousness.  Objective: Vitals:   12/06/20 0514 12/06/20 0806 12/06/20 1201 12/06/20 1519  BP: 132/65 133/62 138/69 138/77  Pulse: 64 68 70 85  Resp:  '18 17 16  '$ Temp: 97.7 F (36.5 C) 97.8 F (36.6 C) 98.2 F (36.8 C) 98.7 F (37.1 C)  TempSrc: Oral Oral Oral Oral  SpO2: 99% 98% 99% 99%  Weight:      Height:        Intake/Output Summary (Last 24 hours) at 12/06/2020 1608 Last data filed at 12/06/2020 1359 Gross per 24 hour  Intake 1628.94 ml  Output 370 ml  Net 1258.94 ml   Filed Weights   12/03/20 1700 12/05/20 1323  Weight: 74 kg 74 kg    Examination: General appearance:  adult male, alert and in no acute distress.   HEENT: Anicteric, conjunctiva pink, lids and lashes normal. No nasal deformity, discharge, epistaxis.  Lips moist.   Skin: Warm and dry.  no jaundice.  No suspicious rashes or lesions. Cardiac: RRR, nl S1-S2, no murmurs appreciated.  Capillary refill is brisk.  No JVD, no LE edema.  Radial  pulses 2+ and symmetric. Respiratory: Normal respiratory rate and rhythm.  CTAB without rales or wheezes. Abdomen: Abdomen soft.  Nonfocal TTP without guarding. No ascites, distension, hepatosplenomegaly.   MSK: No deformities or effusions. Neuro: Awake and alert.  EOMI, moves all extremities. Speech fluent.    Psych: Sensorium intact and  responding to questions, attention normal. Affect appropriate.  Judgment and insight appear normal.    Data Reviewed: I have personally reviewed following labs and imaging studies:  CBC: Recent Labs  Lab 12/02/20 1143 12/03/20 0147 12/04/20 0041 12/05/20 0857 12/06/20 0544  WBC 11.9* 12.6* 9.3 6.2 5.3  NEUTROABS  --   --  7.4 4.2 4.5  HGB 11.9* 12.1* 11.9* 12.0* 11.5*  HCT 35.7* 35.7* 34.1* 35.1* 33.2*  MCV 100.2* 98.6 97.2 98.0 96.0  PLT 203.0 180 198 230 [id number]   Basic Metabolic Panel: Recent Labs  Lab 12/02/20 1143 12/03/20 0147 12/04/20 0041 12/06/20 0544 12/06/20 0554  NA 132* 129* 134* 137  --   K 4.3 3.8 3.8 3.0*  --   CL 98 98 101 99  --   CO2 25 21* 22 28  --   GLUCOSE 116* 119* 86 202*  --   BUN '18 17 16 12  '$ --   CREATININE 1.30 1.26* 1.09 1.23  --   CALCIUM 9.3 9.0 8.9 8.3*  --   MG  --   --   --   --  1.3*   GFR: Estimated Creatinine Clearance: 45.3 mL/min (by C-G formula based on SCr of 1.23 mg/dL). Liver Function Tests: Recent Labs  Lab 12/02/20 1143 12/03/20 0147  AST 28 37  ALT 18 27  ALKPHOS 77 78  BILITOT 2.0* 1.9*  PROT 6.8 6.6  ALBUMIN 3.9 3.6   No results for input(s): LIPASE, AMYLASE in the last 168 hours. No results for input(s): AMMONIA in the last 168 hours. Coagulation Profile: Recent Labs  Lab 12/02/20 1415 12/03/20 0147 12/04/20 0041 12/05/20 0857 12/06/20 0544  INR 3.3* 3.3* 1.7* 1.3* 1.4*   Cardiac Enzymes: No results for input(s): CKTOTAL, CKMB, CKMBINDEX, TROPONINI in the last 168 hours. BNP (last 3 results) No results for input(s): PROBNP in the last 8760 hours. HbA1C: No results for input(s): HGBA1C in the last 72 hours. CBG: Recent Labs  Lab 12/05/20 1625 12/05/20 1852 12/05/20 2314 12/06/20 0511 12/06/20 1156  GLUCAP 131* 146* 178* 176* 152*   Lipid Profile: No results for input(s): CHOL, HDL, LDLCALC, TRIG, CHOLHDL, LDLDIRECT in the last 72 hours. Thyroid Function Tests: No results for input(s): TSH,  T4TOTAL, FREET4, T3FREE, THYROIDAB in the last 72 hours. Anemia Panel: No results for input(s): VITAMINB12, FOLATE, FERRITIN, TIBC, IRON, RETICCTPCT in the last 72 hours. Urine analysis:    Component Value Date/Time   COLORURINE YELLOW 12/02/2020 [place] 12/02/2020 1143   LABSPEC 1.015 12/02/2020 1143   PHURINE 5.0 12/02/2020 1143   GLUCOSEU NEGATIVE 12/02/2020 1143   HGBUR NEGATIVE 12/02/2020 1143   [place] 12/02/2020 1143   KETONESUR NEGATIVE 12/02/2020 1143   PROTEINUR 30 (A) 08/07/2013 1633   UROBILINOGEN 0.2 12/02/2020 1143   NITRITE NEGATIVE  12/02/2020 1143   LEUKOCYTESUR NEGATIVE 12/02/2020 1143   Sepsis Labs: '@LABRCNTIP'$ (procalcitonin:4,lacticacidven:4)  ) Recent Results (from the past 240 hour(s))  Resp Panel by RT-PCR (Flu A&B, Covid) Nasopharyngeal Swab     Status: None   Collection Time: 12/02/20  7:15 PM   Specimen: Nasopharyngeal Swab; Nasopharyngeal(NP) swabs in vial transport medium  Result Value Ref Range Status   SARS Coronavirus 2 by RT PCR NEGATIVE NEGATIVE Final    Comment: (NOTE) SARS-CoV-2 target nucleic acids are NOT DETECTED.  The SARS-CoV-2 RNA is generally detectable in upper respiratory specimens during the acute phase of infection. The lowest concentration of SARS-CoV-2 viral copies this assay can detect is 138 copies/mL. A negative result does not preclude SARS-Cov-2 infection and should not be used as the sole basis for treatment or other patient management decisions. A negative result may occur with  improper specimen collection/handling, submission of specimen other than nasopharyngeal swab, presence of viral mutation(s) within the areas targeted by this assay, and inadequate number of viral copies(<138 copies/mL). A negative result must be combined with clinical observations, patient history, and epidemiological information. The expected result is Negative.  Fact Sheet for Patients:   EntrepreneurPulse.com.au  Fact Sheet for Healthcare Providers:  IncredibleEmployment.be  This test is no t yet approved or cleared by the [place] FDA and  has been authorized for detection and/or diagnosis of SARS-CoV-2 by FDA under an Emergency Use Authorization (EUA). This EUA will remain  in effect (meaning this test can be used) for the duration of the COVID-19 declaration under Section 564(b)(1) of the Act, 21 U.S.C.section 360bbb-3(b)(1), unless the authorization is terminated  or revoked sooner.       Influenza A by PCR NEGATIVE NEGATIVE Final   Influenza B by PCR NEGATIVE NEGATIVE Final    Comment: (NOTE) The Xpert Xpress SARS-CoV-2/FLU/RSV plus assay is intended as an aid in the diagnosis of influenza from Nasopharyngeal swab specimens and should not be used as a sole basis for treatment. Nasal washings and aspirates are unacceptable for Xpert Xpress SARS-CoV-2/FLU/RSV testing.  Fact Sheet for Patients: EntrepreneurPulse.com.au  Fact Sheet for Healthcare Providers: IncredibleEmployment.be  This test is not yet approved or cleared by the [place] FDA and has been authorized for detection and/or diagnosis of SARS-CoV-2 by FDA under an Emergency Use Authorization (EUA). This EUA will remain in effect (meaning this test can be used) for the duration of the COVID-19 declaration under Section 564(b)(1) of the Act, 21 U.S.C. section 360bbb-3(b)(1), unless the authorization is terminated or revoked.  Performed at [facility], [place] [address], [place], [place] [address]   Surgical pcr screen     Status: None   Collection Time: 12/04/20  6:10 PM   Specimen: Nasal Mucosa; Nasal Swab  Result Value Ref Range Status   MRSA, PCR NEGATIVE NEGATIVE Final   Staphylococcus aureus NEGATIVE NEGATIVE Final    Comment: (NOTE) The Xpert SA Assay (FDA approved for NASAL specimens in patients  12 years of age and older), is one component of a comprehensive surveillance program. It is not intended to diagnose infection nor to guide or monitor treatment. Performed at [facility], [place] [address] [place], [place] [address]          Radiology Studies: No results found.      Scheduled Meds:  [START ON 12/07/2020] bisoprolol  2.5 mg Oral Daily   feeding supplement  1 Container Oral TID BM   potassium chloride  40 mEq Oral BID  Continuous Infusions:  heparin 1,350 Units/hr (12/06/20 1601)   piperacillin-tazobactam 3.375 g (12/06/20 1016)     LOS: 3 days    Time spent: 35 minutes    Edwin Dada, MD [facility] 12/06/2020, 4:08 PM     Please page though [place] or Epic secure chat:  For [employer], Adult nurse

## 2020-12-07 DIAGNOSIS — K3580 Unspecified acute appendicitis: Secondary | ICD-10-CM | POA: Diagnosis not present

## 2020-12-07 DIAGNOSIS — I4821 Permanent atrial fibrillation: Secondary | ICD-10-CM | POA: Diagnosis not present

## 2020-12-07 DIAGNOSIS — I10 Essential (primary) hypertension: Secondary | ICD-10-CM | POA: Diagnosis not present

## 2020-12-07 DIAGNOSIS — Z952 Presence of prosthetic heart valve: Secondary | ICD-10-CM | POA: Diagnosis not present

## 2020-12-07 LAB — MAGNESIUM
Magnesium: 1.3 mg/dL — ABNORMAL LOW (ref 1.7–2.4)
Magnesium: 1.1 mg/dL — ABNORMAL LOW (ref 1.7–2.4)

## 2020-12-07 LAB — HEPARIN LEVEL (UNFRACTIONATED): Heparin Unfractionated: 0.87 IU/mL — ABNORMAL HIGH (ref 0.30–0.70)

## 2020-12-07 LAB — CBC WITH DIFFERENTIAL/PLATELET
Abs Immature Granulocytes: 0.16 10*3/uL — ABNORMAL HIGH (ref 0.00–0.07)
Abs Immature Granulocytes: 0.14 10*3/uL — ABNORMAL HIGH (ref 0.00–0.07)
Basophils Absolute: 0.1 10*3/uL (ref 0.0–0.1)
Basophils Absolute: 0.1 10*3/uL (ref 0.0–0.1)
Basophils Relative: 0 %
Basophils Relative: 0 %
Eosinophils Absolute: 0.1 10*3/uL (ref 0.0–0.5)
Eosinophils Absolute: 0.1 10*3/uL (ref 0.0–0.5)
Eosinophils Relative: 1 %
Eosinophils Relative: 1 %
HCT: 37.2 % — ABNORMAL LOW (ref 39.0–52.0)
HCT: 31.4 % — ABNORMAL LOW (ref 39.0–52.0)
Hemoglobin: 12.6 g/dL — ABNORMAL LOW (ref 13.0–17.0)
Hemoglobin: 10.8 g/dL — ABNORMAL LOW (ref 13.0–17.0)
Immature Granulocytes: 1 %
Immature Granulocytes: 1 %
Lymphocytes Relative: 14 %
Lymphocytes Relative: 11 %
Lymphs Abs: 2.3 10*3/uL (ref 0.7–4.0)
Lymphs Abs: 1.4 10*3/uL (ref 0.7–4.0)
MCH: 33.2 pg (ref 26.0–34.0)
MCH: 33.4 pg (ref 26.0–34.0)
MCHC: 33.9 g/dL (ref 30.0–36.0)
MCHC: 34.4 g/dL (ref 30.0–36.0)
MCV: 97.9 fL (ref 80.0–100.0)
MCV: 97.2 fL (ref 80.0–100.0)
Monocytes Absolute: 1.2 10*3/uL — ABNORMAL HIGH (ref 0.1–1.0)
Monocytes Absolute: 1 10*3/uL (ref 0.1–1.0)
Monocytes Relative: 7 %
Monocytes Relative: 8 %
Neutro Abs: 13.3 10*3/uL — ABNORMAL HIGH (ref 1.7–7.7)
Neutro Abs: 10 10*3/uL — ABNORMAL HIGH (ref 1.7–7.7)
Neutrophils Relative %: 77 %
Neutrophils Relative %: 79 %
Platelets: 303 10*3/uL (ref 150–400)
Platelets: 257 10*3/uL (ref 150–400)
RBC: 3.8 MIL/uL — ABNORMAL LOW (ref 4.22–5.81)
RBC: 3.23 MIL/uL — ABNORMAL LOW (ref 4.22–5.81)
RDW: 13.8 % (ref 11.5–15.5)
RDW: 13.9 % (ref 11.5–15.5)
WBC: 17.2 10*3/uL — ABNORMAL HIGH (ref 4.0–10.5)
WBC: 12.7 10*3/uL — ABNORMAL HIGH (ref 4.0–10.5)
nRBC: 0 % (ref 0.0–0.2)
nRBC: 0 % (ref 0.0–0.2)

## 2020-12-07 LAB — COMPREHENSIVE METABOLIC PANEL
ALT: 21 U/L (ref 0–44)
AST: 31 U/L (ref 15–41)
Albumin: 2.9 g/dL — ABNORMAL LOW (ref 3.5–5.0)
Alkaline Phosphatase: 61 U/L (ref 38–126)
Anion gap: 12 (ref 5–15)
BUN: 17 mg/dL (ref 8–23)
CO2: 25 mmol/L (ref 22–32)
Calcium: 8.5 mg/dL — ABNORMAL LOW (ref 8.9–10.3)
Chloride: 96 mmol/L — ABNORMAL LOW (ref 98–111)
Creatinine, Ser: 1.85 mg/dL — ABNORMAL HIGH (ref 0.61–1.24)
GFR, Estimated: 35 mL/min — ABNORMAL LOW (ref >60)
Glucose, Bld: 135 mg/dL — ABNORMAL HIGH (ref 70–99)
Potassium: 3.8 mmol/L (ref 3.5–5.1)
Sodium: 133 mmol/L — ABNORMAL LOW (ref 135–145)
Total Bilirubin: 1.7 mg/dL — ABNORMAL HIGH (ref 0.3–1.2)
Total Protein: 5.6 g/dL — ABNORMAL LOW (ref 6.5–8.1)

## 2020-12-07 LAB — LACTIC ACID, PLASMA
Lactic Acid, Venous: 2.1 mmol/L (ref 0.5–1.9)
Lactic Acid, Venous: 1.9 mmol/L (ref 0.5–1.9)

## 2020-12-07 LAB — TROPONIN I (HIGH SENSITIVITY)
Troponin I (High Sensitivity): 24 ng/L — ABNORMAL HIGH (ref <18)
Troponin I (High Sensitivity): 25 ng/L — ABNORMAL HIGH (ref <18)

## 2020-12-07 LAB — PROTIME-INR
INR: 1.6 — ABNORMAL HIGH (ref 0.8–1.2)
Prothrombin Time: 18.7 seconds — ABNORMAL HIGH (ref 11.4–15.2)

## 2020-12-07 LAB — POTASSIUM: Potassium: 3.4 mmol/L — ABNORMAL LOW (ref 3.5–5.1)

## 2020-12-07 LAB — GLUCOSE, CAPILLARY
Glucose-Capillary: 126 mg/dL — ABNORMAL HIGH (ref 70–99)
Glucose-Capillary: 112 mg/dL — ABNORMAL HIGH (ref 70–99)

## 2020-12-07 LAB — ABO/RH: ABO/RH(D): A POS

## 2020-12-07 MED ORDER — MAGNESIUM SULFATE 2 GM/50ML IV SOLN
2.0000 g | Freq: Once | INTRAVENOUS | Status: AC
  Administered 2020-12-07: 2 g via INTRAVENOUS
  Filled 2020-12-07: qty 50

## 2020-12-07 MED ORDER — VANCOMYCIN HCL 1500 MG/300ML IV SOLN: 1500.0000 mg | INTRAVENOUS | Status: DC

## 2020-12-07 MED ORDER — SODIUM CHLORIDE 0.9 % IV SOLN
3.0000 g | Freq: Two times a day (BID) | INTRAVENOUS | Status: DC
  Administered 2020-12-07 – 2020-12-08 (×3): 3 g via INTRAVENOUS
  Filled 2020-12-07 (×5): qty 8

## 2020-12-07 MED ORDER — VANCOMYCIN HCL IN DEXTROSE 1-5 GM/200ML-% IV SOLN: 1000.0000 mg | INTRAVENOUS | Status: DC

## 2020-12-07 MED ORDER — SODIUM CHLORIDE 0.9 % IV BOLUS
500.0000 mL | Freq: Once | INTRAVENOUS | Status: AC
  Administered 2020-12-07: 500 mL via INTRAVENOUS

## 2020-12-07 MED ORDER — LACTATED RINGERS IV SOLN: INTRAVENOUS | Status: AC

## 2020-12-07 MED ORDER — VANCOMYCIN HCL 1500 MG/300ML IV SOLN
1500.0000 mg | Freq: Once | INTRAVENOUS | Status: AC
  Administered 2020-12-07: 1500 mg via INTRAVENOUS
  Filled 2020-12-07: qty 300

## 2020-12-07 MED ORDER — POTASSIUM CHLORIDE CRYS ER 20 MEQ PO TBCR
40.0000 meq | EXTENDED_RELEASE_TABLET | Freq: Two times a day (BID) | ORAL | Status: AC
  Administered 2020-12-07 (×2): 40 meq via ORAL
  Filled 2020-12-07 (×2): qty 2

## 2020-12-07 MED ORDER — HEPARIN (PORCINE) 25000 UT/250ML-% IV SOLN
1200.0000 [IU]/h | INTRAVENOUS | Status: DC
  Administered 2020-12-07: 1200 [IU]/h via INTRAVENOUS
  Filled 2020-12-07: qty 250

## 2020-12-07 MED ORDER — MAGNESIUM SULFATE 2 GM/50ML IV SOLN
2.0000 g | Freq: Once | INTRAVENOUS | Status: AC
  Administered 2020-12-07: 2 g via INTRAVENOUS
  Filled 2020-12-07 (×2): qty 50

## 2020-12-07 NOTE — Progress Notes (Addendum)
JP drain 150 mL sanguinous drainage.  Pt passing gas.  Small BM.  Messaged PA. PA and Dr. Loleta Books came to see pt.  Heparin drip stopped per PA.

## 2020-12-07 NOTE — Progress Notes (Addendum)
Lab called critical value for Lactic acid 3.1. Paged MD  MD called back to confirm.

## 2020-12-07 NOTE — Progress Notes (Signed)
[facility] PROGRESS NOTE    Anthony Gould  [id number] DOB: 1933/10/08 DOA: 12/02/2020 PCP: Cassandria Anger, MD      Brief Narrative:  Anthony Gould is a 87 y.o. M with pAF on warfarin, hx mech mitral valve ([employer] [place] titanium in 1986), CKD IIIa, pHTN, HTN, hx TIA who presented to PCP with RLQ pain, fever and reduced appetite, sent to ER.  In the ER, CT confirmed acute appendicitis.  Started on heparin and admitted.         Assessment & Plan:  Acute appendicitis S/P appendectomy 9/12 by Dr. Barry Dienes - Post-op care per Gen Surg    Leukocytosis, tachycardia, malaise WBC way up today, HR also up to 140s, ECG reviewed, in A. Fib.  No fever.  Not sepsis I don't think, exam seems benign, but he reports an increase in abdominal pain, generalized malaise, and dizziness worsening snice last night.  Differential includes post-op Afib, hemorrhage, infection.  - IV fluids - Stat CBC and CMP - Check Cultures urine and blood  - Continue antibiotics, add vancomycin - Continue serial exams - Increased bloody drainage in bulb, surgery will temporarily hold heparin  - Transfer to Progressive care   Acquired thrombophilia [employer] mechanical valve Permanent atrial fibrillation Plan for Lovenox bridge at d/c.  Until Gen Surg are clearer with their recommendations, I will maintain on heparin  HR up this morning - Hold heparin, temporarily, will restart this morning if CBC normal  - Hold warfarin   Diabetes Diet controlled.  He has a chart history of DM, and lots of A1c's in the last, all of which are normal.  I assume this is resolved diabetes.  Here his sugars are fine. Glucose normal -Hold Aspirin - COntinue Crestor  CKD stage IIIa - CHeck BMP tomorrow  Hypertension BP normal - Continue bisoprolol - Hold amlodipine, enalapril  Other medications -Hold dipyridamole, indication unclear, last Cards note 12/08/19 says "no prior history  of coronary disease in his chart"  PUlmonary hypertension - Hold Lasix  Hypokalemia Hypomagnesemia Both still low - Supp K and Mag   Hyponatremia Resolved post-op  Gout - Continue allopurinol  GERD -Hold pantoprazole as not symptomatic                Disposition: Status is: Inpatient  Remains inpatient appropriate because:Inpatient level of care appropriate due to severity of illness  Dispo: The patient is from: Home              Anticipated d/c is to: Home              Patient currently is not medically stable to d/c.   Difficult to place patient No       Level of care: Progressive       MDM: The below labs and imaging reports were reviewed and summarized above.  Medication management as above.      DVT prophylaxis: Place and maintain sequential compression device Start: 12/06/20 1024  Code Status: FULL Family Communication: attempted three times to call wife about transfer, both numbers attempted, no answer    Consultants:  Gen Surg Cardiology  Procedures:  9/12 appendectomy  Antimicrobials:  Zosyn  Vancomycin  Culture data:             Subjective: Patient is a little sore, but no headache, chest pain, dyspnea, orthopnea, swelling, focal weakness or numbness, confusion, loss of consciousness.  Objective: Vitals:   12/07/20 0000 12/07/20 0158 12/07/20 0549 12/07/20  0918  BP: 128/89 139/75 140/86 115/67  Pulse: (!) 108 (!) 104 (!) 104 (!) 105  Resp: 17   17  Temp: 98.2 F (36.8 C)  98.2 F (36.8 C) 97.8 F (36.6 C)  TempSrc: Oral  Oral Oral  SpO2: 99% 99% 99% 98%  Weight:      Height:        Intake/Output Summary (Last 24 hours) at 12/07/2020 1007 Last data filed at 12/07/2020 0544 Gross per 24 hour  Intake 914.33 ml  Output 185 ml  Net 729.33 ml   Filed Weights   12/03/20 1700 12/05/20 1323  Weight: 74 kg 74 kg    Examination: General appearance:  adult male, alert and in no acute distress.   HEENT:  Anicteric, conjunctiva pink, lids and lashes normal. No nasal deformity, discharge, epistaxis.  Lips moist.   Skin: Warm and dry.  no jaundice.  No suspicious rashes or lesions. Cardiac: RRR, nl S1-S2, no murmurs appreciated.  Capillary refill is brisk.  No JVD, no LE edema.  Radial  pulses 2+ and symmetric. Respiratory: Normal respiratory rate and rhythm.  CTAB without rales or wheezes. Abdomen: Abdomen soft.  Nonfocal TTP without guarding. No ascites, distension, hepatosplenomegaly.   MSK: No deformities or effusions. Neuro: Awake and alert.  EOMI, moves all extremities. Speech fluent.    Psych: Sensorium intact and responding to questions, attention normal. Affect appropriate.  Judgment and insight appear normal.    Data Reviewed: I have personally reviewed following labs and imaging studies:  CBC: Recent Labs  Lab 12/03/20 0147 12/04/20 0041 12/05/20 0857 12/06/20 0544 12/07/20 0149  WBC 12.6* 9.3 6.2 5.3 17.2*  NEUTROABS  --  7.4 4.2 4.5 13.3*  HGB 12.1* 11.9* 12.0* 11.5* 12.6*  HCT 35.7* 34.1* 35.1* 33.2* 37.2*  MCV 98.6 97.2 98.0 96.0 97.9  PLT 180 198 230 202 [id number]   Basic Metabolic Panel: Recent Labs  Lab 12/02/20 1143 12/03/20 0147 12/04/20 0041 12/06/20 0544 12/06/20 0554 12/07/20 0149  NA 132* 129* 134* 137  --   --   K 4.3 3.8 3.8 3.0*  --  3.4*  CL 98 98 101 99  --   --   CO2 25 21* 22 28  --   --   GLUCOSE 116* 119* 86 202*  --   --   BUN '18 17 16 12  '$ --   --   CREATININE 1.30 1.26* 1.09 1.23  --   --   CALCIUM 9.3 9.0 8.9 8.3*  --   --   MG  --   --   --   --  1.3* 1.3*   GFR: Estimated Creatinine Clearance: 45.3 mL/min (by C-G formula based on SCr of 1.23 mg/dL). Liver Function Tests: Recent Labs  Lab 12/02/20 1143 12/03/20 0147  AST 28 37  ALT 18 27  ALKPHOS 77 78  BILITOT 2.0* 1.9*  PROT 6.8 6.6  ALBUMIN 3.9 3.6   No results for input(s): LIPASE, AMYLASE in the last 168 hours. No results for input(s): AMMONIA in the last 168  hours. Coagulation Profile: Recent Labs  Lab 12/03/20 0147 12/04/20 0041 12/05/20 0857 12/06/20 0544 12/07/20 0149  INR 3.3* 1.7* 1.3* 1.4* 1.6*   Cardiac Enzymes: No results for input(s): CKTOTAL, CKMB, CKMBINDEX, TROPONINI in the last 168 hours. BNP (last 3 results) No results for input(s): PROBNP in the last 8760 hours. HbA1C: No results for input(s): HGBA1C in the last 72 hours. CBG: Recent Labs  Lab 12/05/20 1852  12/05/20 2314 12/06/20 0511 12/06/20 1156 12/06/20 1745  GLUCAP 146* 178* 176* 152* 129*   Lipid Profile: No results for input(s): CHOL, HDL, LDLCALC, TRIG, CHOLHDL, LDLDIRECT in the last 72 hours. Thyroid Function Tests: No results for input(s): TSH, T4TOTAL, FREET4, T3FREE, THYROIDAB in the last 72 hours. Anemia Panel: No results for input(s): VITAMINB12, FOLATE, FERRITIN, TIBC, IRON, RETICCTPCT in the last 72 hours. Urine analysis:    Component Value Date/Time   COLORURINE YELLOW 12/02/2020 [place] 12/02/2020 1143   LABSPEC 1.015 12/02/2020 1143   PHURINE 5.0 12/02/2020 1143   GLUCOSEU NEGATIVE 12/02/2020 1143   HGBUR NEGATIVE 12/02/2020 1143   BILIRUBINUR NEGATIVE 12/02/2020 1143   KETONESUR NEGATIVE 12/02/2020 1143   PROTEINUR 30 (A) 08/07/2013 1633   UROBILINOGEN 0.2 12/02/2020 1143   NITRITE NEGATIVE 12/02/2020 1143   LEUKOCYTESUR NEGATIVE 12/02/2020 1143   Sepsis Labs: '@LABRCNTIP'$ (procalcitonin:4,lacticacidven:4)  ) Recent Results (from the past 240 hour(s))  Resp Panel by RT-PCR (Flu A&B, Covid) Nasopharyngeal Swab     Status: None   Collection Time: 12/02/20  7:15 PM   Specimen: Nasopharyngeal Swab; Nasopharyngeal(NP) swabs in vial transport medium  Result Value Ref Range Status   SARS Coronavirus 2 by RT PCR NEGATIVE NEGATIVE Final    Comment: (NOTE) SARS-CoV-2 target nucleic acids are NOT DETECTED.  The SARS-CoV-2 RNA is generally detectable in upper respiratory specimens during the acute phase of infection. The  lowest concentration of SARS-CoV-2 viral copies this assay can detect is 138 copies/mL. A negative result does not preclude SARS-Cov-2 infection and should not be used as the sole basis for treatment or other patient management decisions. A negative result may occur with  improper specimen collection/handling, submission of specimen other than nasopharyngeal swab, presence of viral mutation(s) within the areas targeted by this assay, and inadequate number of viral copies(<138 copies/mL). A negative result must be combined with clinical observations, patient history, and epidemiological information. The expected result is Negative.  Fact Sheet for Patients:  EntrepreneurPulse.com.au  Fact Sheet for Healthcare Providers:  IncredibleEmployment.be  This test is no t yet approved or cleared by the [place] FDA and  has been authorized for detection and/or diagnosis of SARS-CoV-2 by FDA under an Emergency Use Authorization (EUA). This EUA will remain  in effect (meaning this test can be used) for the duration of the COVID-19 declaration under Section 564(b)(1) of the Act, 21 U.S.C.section 360bbb-3(b)(1), unless the authorization is terminated  or revoked sooner.       Influenza A by PCR NEGATIVE NEGATIVE Final   Influenza B by PCR NEGATIVE NEGATIVE Final    Comment: (NOTE) The Xpert Xpress SARS-CoV-2/FLU/RSV plus assay is intended as an aid in the diagnosis of influenza from Nasopharyngeal swab specimens and should not be used as a sole basis for treatment. Nasal washings and aspirates are unacceptable for Xpert Xpress SARS-CoV-2/FLU/RSV testing.  Fact Sheet for Patients: EntrepreneurPulse.com.au  Fact Sheet for Healthcare Providers: IncredibleEmployment.be  This test is not yet approved or cleared by the [place] FDA and has been authorized for detection and/or diagnosis of SARS-CoV-2 by FDA under  an Emergency Use Authorization (EUA). This EUA will remain in effect (meaning this test can be used) for the duration of the COVID-19 declaration under Section 564(b)(1) of the Act, 21 U.S.C. section 360bbb-3(b)(1), unless the authorization is terminated or revoked.  Performed at [facility], [place] [address], [place], [place] [address]   Surgical pcr screen     Status: None  Collection Time: 12/04/20  6:10 PM   Specimen: Nasal Mucosa; Nasal Swab  Result Value Ref Range Status   MRSA, PCR NEGATIVE NEGATIVE Final   Staphylococcus aureus NEGATIVE NEGATIVE Final    Comment: (NOTE) The Xpert SA Assay (FDA approved for NASAL specimens in patients 84 years of age and older), is one component of a comprehensive surveillance program. It is not intended to diagnose infection nor to guide or monitor treatment. Performed at [facility], [place] [address], [place], [place] [address]          Radiology Studies: No results found.      Scheduled Meds:  allopurinol  300 mg Oral Daily   atorvastatin  20 mg Oral Once per day on Wed Sat   bisoprolol  2.5 mg Oral Daily   feeding supplement  1 Container Oral TID BM   latanoprost  1 drop Both Eyes QHS   potassium chloride  40 mEq Oral BID   Continuous Infusions:  heparin Stopped (12/07/20 0939)   magnesium sulfate bolus IVPB     piperacillin-tazobactam 3.375 g (12/07/20 0209)   sodium chloride       LOS: 4 days    Time spent: 35 minutes    Edwin Dada, MD [facility] 12/07/2020, 10:07 AM     Please page though Shea Evans or Epic secure chat:  For [employer], Adult nurse

## 2020-12-07 NOTE — Progress Notes (Signed)
2 Days Post-Op    CC: Abdominal pain  Subjective: Patient with acute appendicitis underwent laparoscopic appendectomy 12/05/2020.  He says he feels terrible.  He got up short time ago and felt like he was going to fall secondary to weakness.  Nurses noted his heart rates up in the 150s, he is in chronic atrial fib.  Nurse that she just emptied the JP 150 units about 1/3 full again.  His CBC was at 2 AM.  We will repeat his CBC.  Objective: Vital signs in last 24 hours: Temp:  [97.8 F (36.6 C)-98.7 F (37.1 C)] 97.8 F (36.6 C) (09/14 0918) Pulse Rate:  [70-108] 105 (09/14 0918) Resp:  [16-17] 17 (09/14 0918) BP: (115-149)/(67-89) 115/67 (09/14 0918) SpO2:  [98 %-99 %] 98 % (09/14 0918) Last BM Date: 12/06/20  Intake/Output from previous day: 09/13 0701 - 09/14 0700 In: 1094.3 [P.O.:360; I.V.:620.4; IV Piggyback:113.9] Out: 225 [Drains:225] Intake/Output this shift: No intake/output data recorded.  General appearance: alert, cooperative, no distress, and he is pale and anxious.  HR 140-80's on sat monitor in room.  BP 120-104/70's -80's Resp: clear to auscultation bilaterally GI: soft, sore, port sites look fine.  JP drain is bloody drainage.  Nurse just emptied 150, and it is about 1/3 of the was full again  Lab Results:  Recent Labs    12/06/20 0544 12/07/20 0149  WBC 5.3 17.2*  HGB 11.5* 12.6*  HCT 33.2* 37.2*  PLT 202 303    BMET Recent Labs    12/06/20 0544 12/07/20 0149  NA 137  --   K 3.0* 3.4*  CL 99  --   CO2 28  --   GLUCOSE 202*  --   BUN 12  --   CREATININE 1.23  --   CALCIUM 8.3*  --    PT/INR Recent Labs    12/06/20 0544 12/07/20 0149  LABPROT 17.4* 18.7*  INR 1.4* 1.6*    Recent Labs  Lab 12/02/20 1143 12/03/20 0147  AST 28 37  ALT 18 27  ALKPHOS 77 78  BILITOT 2.0* 1.9*  PROT 6.8 6.6  ALBUMIN 3.9 3.6     Lipase     Component Value Date/Time   LIPASE 15.0 12/18/2017 [id number]     Medications:  allopurinol  300 mg Oral Daily    atorvastatin  20 mg Oral Once per day on Wed Sat   bisoprolol  2.5 mg Oral Daily   feeding supplement  1 Container Oral TID BM   latanoprost  1 drop Both Eyes QHS   potassium chloride  40 mEq Oral BID    Assessment/Plan POD# 2  Laparoscopic appendectomy for Acute appendicitis 9/12 Dr. Barry Dienes  - appendiceal orifice was sutured with a pursestring 2-0 vicryl using an Endostitch, drain was placed adjacent to the cecum - Advance to regular diet, add Boost Breeze - mobilize, PT consult - continue IV antibiotics - continue JP drain bloody this AM, staff just emptied 150 and it is filling up again - On IV heparin.     - Plan: Dr. Loleta Books is in the room also.  He is ordering CBC repeat, we are stopping heparin for now.  Will discuss CT scan after the CBC is back.  - WBC 12.6>>9.3>>5.3>>17.2   ID - zosyn 9/10>> FEN - d/c IVF, regular diet VTE - IV heparin Foley - none   Mechanical mitral valve replacement on coumadin - continue IV heparin Permanent A Fib  - HR up to 150's Hypomagnesemia   -  repeat CMP pending/Mag ordered HTN CAD CKD IIIa CHF Cirrhosis       LOS: 4 days    Waller Marcussen 12/07/2020 Please see Amion

## 2020-12-07 NOTE — Progress Notes (Signed)
Hgb stable, Gen Surg assessment noted.  I feel benefit of cautious heparin re-initiation outweighs risk.  Will restart w/o bolus.

## 2020-12-07 NOTE — Progress Notes (Signed)
ANTICOAGULATION CONSULT NOTE - Follow Up Consult  Pharmacy Consult for Heparin Indication: mechanical valve and atrial fibrillation  Allergies  Allergen Reactions   Cardizem [Diltiazem]     Bradycardia per pt Doing well on Amlodipine   Sulfonamide Derivatives     Unknown     Patient Measurements: Height: '5\' 10"'$  (177.8 cm) Weight: 74 kg (163 lb 2.3 oz) IBW/kg (Calculated) : 73 Heparin Dosing Weight: 74kg  Vital Signs: Temp: 98.2 F (36.8 C) (09/14 0000) Temp Source: Oral (09/14 0000) BP: 139/75 (09/14 0158) Pulse Rate: 104 (09/14 0158)  Labs: Recent Labs    12/04/20 1652 12/04/20 1652 12/05/20 0857 12/06/20 0544 12/06/20 1133 12/07/20 0149  HGB  --    < > 12.0* 11.5*  --  12.6*  HCT  --   --  35.1* 33.2*  --  37.2*  PLT  --   --  230 202  --  303  APTT 55*  --   --   --   --   --   LABPROT  --   --  15.7* 17.4*  --  18.7*  INR  --   --  1.3* 1.4*  --  1.6*  HEPARINUNFRC 0.17*  --  <0.10* 0.37 0.47 0.87*  CREATININE  --   --   --  1.23  --   --    < > = values in this interval not displayed.     Estimated Creatinine Clearance: 45.3 mL/min (by C-G formula based on SCr of 1.23 mg/dL).  Assessment:  Anticoag: On coumadin PTA for mechanical valve and atrial fibrillation Consulted to start heparin drip when INR <2.5 -9/10: VitK '2mg'$  x1  - Hep level now elevated at 0.87. CBC stable. INR 1.6.  Goal of Therapy:  Heparin level 0.3-0.7 units/ml Monitor platelets by anticoagulation protocol: Yes   Plan:  Reduce heparin to 1200 units/hr Will need to restart home meds after surgical interventions   Erin Hearing PharmD., BCPS Clinical Pharmacist 12/07/2020 3:03 AM

## 2020-12-07 NOTE — Progress Notes (Addendum)
Pharmacy Antibiotic Note  Anthony Gould is a 87 y.o. male admitted on 12/02/2020 with  intra-abdominal infection , currently on Zosyn.  Pharmacy has been consulted to vancomycin for sepsis. SCr 1.23 stable (last 9/13).  Plan: Continue Zosyn 3.375g IV q8h (4h infusion) Start Vancomycin '1500mg'$  IV x 1; then '1000mg'$  IV q24h. Goal AUC 400-550. Expected AUC: 447 SCr used: 1.23 Monitor clinical progress, c/s, renal function F/u de-escalation plan/LOT, vancomycin levels as indicated  ADDENDUM - Repeat SCr today is increased, now up to 1.85.  Plan: Continue Zosyn 3.375g IV q8h (4h infusion) for now - consider switch to cefepime + Flagyl with AKI? Adjust vancomycin to '1500mg'$  IV q48h Expected AUC 479 SCr used 1.85 Monitor clinical progress, c/s, renal function F/u de-escalation plan/LOT, vancomycin levels as indicated   Height: '5\' 10"'$  (177.8 cm) Weight: 74 kg (163 lb 2.3 oz) IBW/kg (Calculated) : 73  Temp (24hrs), Avg:98.2 F (36.8 C), Min:97.8 F (36.6 C), Max:98.7 F (37.1 C)  Recent Labs  Lab 12/02/20 1143 12/03/20 0147 12/04/20 0041 12/05/20 0857 12/06/20 0544 12/07/20 0149  WBC 11.9* 12.6* 9.3 6.2 5.3 17.2*  CREATININE 1.30 1.26* 1.09  --  1.23  --     Estimated Creatinine Clearance: 45.3 mL/min (by C-G formula based on SCr of 1.23 mg/dL).    Allergies  Allergen Reactions   Cardizem [Diltiazem]     Bradycardia per pt Doing well on Amlodipine   Sulfonamide Derivatives     Unknown     Arturo Morton, PharmD, BCPS Please check AMION for all [place] contact numbers Clinical Pharmacist 12/07/2020 10:35 AM

## 2020-12-07 NOTE — Progress Notes (Signed)
Around entry point of patient's jp drain, patient is bleeding. Drain is clogged by clot. Notified MD. Removed clot from line and cleaned bulb. 47m of blood and clot from drain.

## 2020-12-07 NOTE — Progress Notes (Addendum)
Phone call from Dr. Loleta Books asking for heparin to restarted with no bolus. Infusion was stopped this AM.  Nurse PM noted JP bleeding around entry site. Will resumed previously lowered rate of 1200 units/hr and check in 8 hrs.  Marlaya Turck S. Alford Highland, PharmD, BCPS Clinical Staff Pharmacist [email or web address]

## 2020-12-08 ENCOUNTER — Ambulatory Visit: Payer: Medicare Other | Admitting: Internal Medicine

## 2020-12-08 DIAGNOSIS — Z952 Presence of prosthetic heart valve: Secondary | ICD-10-CM | POA: Diagnosis not present

## 2020-12-08 DIAGNOSIS — K3580 Unspecified acute appendicitis: Secondary | ICD-10-CM | POA: Diagnosis not present

## 2020-12-08 DIAGNOSIS — I4821 Permanent atrial fibrillation: Secondary | ICD-10-CM | POA: Diagnosis not present

## 2020-12-08 DIAGNOSIS — I10 Essential (primary) hypertension: Secondary | ICD-10-CM | POA: Diagnosis not present

## 2020-12-08 LAB — URINE CULTURE: Culture: NO GROWTH

## 2020-12-08 LAB — PROTIME-INR
INR: 1.7 — ABNORMAL HIGH (ref 0.8–1.2)
Prothrombin Time: 20.3 seconds — ABNORMAL HIGH (ref 11.4–15.2)

## 2020-12-08 LAB — CBC
HCT: 22 % — ABNORMAL LOW (ref 39.0–52.0)
Hemoglobin: 7.5 g/dL — ABNORMAL LOW (ref 13.0–17.0)
MCH: 33.5 pg (ref 26.0–34.0)
MCHC: 34.1 g/dL (ref 30.0–36.0)
MCV: 98.2 fL (ref 80.0–100.0)
Platelets: 185 10*3/uL (ref 150–400)
RBC: 2.24 MIL/uL — ABNORMAL LOW (ref 4.22–5.81)
RDW: 14 % (ref 11.5–15.5)
WBC: 11.3 10*3/uL — ABNORMAL HIGH (ref 4.0–10.5)
nRBC: 0 % (ref 0.0–0.2)

## 2020-12-08 LAB — CBC WITH DIFFERENTIAL/PLATELET
Abs Immature Granulocytes: 0.2 10*3/uL — ABNORMAL HIGH (ref 0.00–0.07)
Basophils Absolute: 0.1 10*3/uL (ref 0.0–0.1)
Basophils Relative: 1 %
Eosinophils Absolute: 0.1 10*3/uL (ref 0.0–0.5)
Eosinophils Relative: 1 %
HCT: 23.9 % — ABNORMAL LOW (ref 39.0–52.0)
Hemoglobin: 8.3 g/dL — ABNORMAL LOW (ref 13.0–17.0)
Immature Granulocytes: 2 %
Lymphocytes Relative: 10 %
Lymphs Abs: 1 10*3/uL (ref 0.7–4.0)
MCH: 34.3 pg — ABNORMAL HIGH (ref 26.0–34.0)
MCHC: 34.7 g/dL (ref 30.0–36.0)
MCV: 98.8 fL (ref 80.0–100.0)
Monocytes Absolute: 0.9 10*3/uL (ref 0.1–1.0)
Monocytes Relative: 9 %
Neutro Abs: 7.8 10*3/uL — ABNORMAL HIGH (ref 1.7–7.7)
Neutrophils Relative %: 77 %
Platelets: 207 10*3/uL (ref 150–400)
RBC: 2.42 MIL/uL — ABNORMAL LOW (ref 4.22–5.81)
RDW: 13.8 % (ref 11.5–15.5)
WBC: 10.1 10*3/uL (ref 4.0–10.5)
nRBC: 0 % (ref 0.0–0.2)

## 2020-12-08 LAB — GLUCOSE, CAPILLARY
Glucose-Capillary: 109 mg/dL — ABNORMAL HIGH (ref 70–99)
Glucose-Capillary: 121 mg/dL — ABNORMAL HIGH (ref 70–99)
Glucose-Capillary: 128 mg/dL — ABNORMAL HIGH (ref 70–99)
Glucose-Capillary: 179 mg/dL — ABNORMAL HIGH (ref 70–99)

## 2020-12-08 LAB — BASIC METABOLIC PANEL
Anion gap: 9 (ref 5–15)
BUN: 21 mg/dL (ref 8–23)
CO2: 24 mmol/L (ref 22–32)
Calcium: 8.4 mg/dL — ABNORMAL LOW (ref 8.9–10.3)
Chloride: 100 mmol/L (ref 98–111)
Creatinine, Ser: 1.91 mg/dL — ABNORMAL HIGH (ref 0.61–1.24)
GFR, Estimated: 34 mL/min — ABNORMAL LOW (ref >60)
Glucose, Bld: 151 mg/dL — ABNORMAL HIGH (ref 70–99)
Potassium: 5.1 mmol/L (ref 3.5–5.1)
Sodium: 133 mmol/L — ABNORMAL LOW (ref 135–145)

## 2020-12-08 MED ORDER — LACTATED RINGERS IV BOLUS
250.0000 mL | Freq: Once | INTRAVENOUS | Status: AC
  Administered 2020-12-08: 250 mL via INTRAVENOUS

## 2020-12-08 NOTE — Progress Notes (Signed)
  Mobility Specialist Criteria Algorithm Info.  Mobility Team: [facility] elevated:Self regulated Activity: Ambulated in room; Dangled on edge of bed; Transferred:  Bed to chair Range of motion: Active; All extremities Level of assistance: Standby assist, set-up cues, supervision of patient - no hands on Assistive device: Other (Comment) (IV Pole) Minutes sitting in chair:  Minutes stood: 1 minutes Minutes ambulated: 1 minutes Distance ambulated (ft): 20 ft Mobility response: Tolerated poorly; RN notified Bed Position: Chair  Patient eager to participate in mobility. Reported feeling some dizziness this morning when he was asked to stand to measure his weight. Before ambulation VSS. Pt got to the EOB with minimal HHA, he then complained of feeling lightheaded. Symptoms lasted over 5 minutes without relief, BP was obtained with no significant decrease or increase. Pt was still eager to attempt ambulation. Ambulated in room at min guard 10 feet but was unable to go beyond the door, requesting to sit down as his symptoms of dizziness got worst. Patient then ambulated to recliner chair without incident. Symptoms dissipated with seated rest. Another BP was obtained and had dropped significantly. Patient did not tolerate ambulation as anticipated and was left sitting recliner chair with all needs met. RN notified.   BP pre (lying):126/63 BP pre (sitting): 130/73 BP post (sitting): 101/63  12/08/2020 3:25 PM

## 2020-12-08 NOTE — Progress Notes (Addendum)
[facility] Progress Note  3 Days Post-Op  Subjective: CC-  Feels about the same as yesterday. Continues to report right sided abdominal discomfort. Denies diffuse abdominal pain. Denies n/v. BM yesterday. States that he has no appetite and does not mind being NPO. IV heparin held yesterday morning. Temporarily restarted last night then held again and is off this morning. Hgb 8.3 from 10.8. 495cc sanguinous fluid documented from JP drain last 24 hours. HR 100-110 while I was in the room, no hypotension.  Objective: Vital signs in last 24 hours: Temp:  [97.5 F (36.4 C)-98.6 F (37 C)] 98.1 F (36.7 C) (09/15 0728) Pulse Rate:  [97-137] 102 (09/15 0728) Resp:  [17-18] 18 (09/15 0728) BP: (106-136)/(59-82) 117/65 (09/15 0728) SpO2:  [94 %-100 %] 100 % (09/15 0728) Weight:  [76.9 kg] 76.9 kg (09/15 0242) Last BM Date: 12/06/20  Intake/Output from previous day: 09/14 0701 - 09/15 0700 In: 812.3 [I.V.:283.5; IV Piggyback:528.7] Out: 745 [Urine:250; Drains:495] Intake/Output this shift: No intake/output data recorded.  PE: Gen:  Alert, NAD, pleasant HEENT: EOM's intact, pupils equal and round Card:  HR 100-110s Pulm:  rate and effort normal Abd: Soft, mild distension, incisions cdi with steri strips in place, TTP RLQ with some guarding but otherwise abdomen is nontender/ no diffuse peritonitis, JP drain with sanguinous fluid in bulb Psych: A&Ox3  Lab Results:  Recent Labs    12/07/20 1001 12/08/20 0358  WBC 12.7* 10.1  HGB 10.8* 8.3*  HCT 31.4* 23.9*  PLT 257 207   BMET Recent Labs    12/07/20 1001 12/08/20 0358  NA 133* 133*  K 3.8 5.1  CL 96* 100  CO2 25 24  GLUCOSE 135* 151*  BUN 17 21  CREATININE 1.85* 1.91*  CALCIUM 8.5* 8.4*   PT/INR Recent Labs    12/07/20 0149 12/08/20 0358  LABPROT 18.7* 20.3*  INR 1.6* 1.7*   CMP     Component Value Date/Time   NA 133 (L) 12/08/2020 0358   K 5.1 12/08/2020 0358   CL 100 12/08/2020 0358   CO2  24 12/08/2020 0358   GLUCOSE 151 (H) 12/08/2020 0358   BUN 21 12/08/2020 0358   CREATININE 1.91 (H) 12/08/2020 0358   CREATININE 1.40 (H) 04/01/2015 1540   CALCIUM 8.4 (L) 12/08/2020 0358   PROT 5.6 (L) 12/07/2020 1001   ALBUMIN 2.9 (L) 12/07/2020 1001   AST 31 12/07/2020 1001   ALT 21 12/07/2020 1001   ALKPHOS 61 12/07/2020 1001   BILITOT 1.7 (H) 12/07/2020 1001   GFRNONAA 34 (L) 12/08/2020 0358   GFRAA 107 11/17/2007 1231   Lipase     Component Value Date/Time   LIPASE 15.0 12/18/2017 0828       Studies/Results: No results found.  Anti-infectives: Anti-infectives (From admission, onward)    Start     Dose/Rate Route Frequency Ordered Stop   12/09/20 1330  vancomycin (VANCOREADY) IVPB 1500 mg/300 mL  Status:  Discontinued        1,500 mg 150 mL/hr over 120 Minutes Intravenous Every 48 hours 12/07/20 1531 12/08/20 0835   12/08/20 1100  vancomycin (VANCOCIN) IVPB 1000 mg/200 mL premix  Status:  Discontinued        1,000 mg 200 mL/hr over 60 Minutes Intravenous Every 24 hours 12/07/20 1033 12/07/20 1531   12/07/20 1900  Ampicillin-Sulbactam (UNASYN) 3 g in sodium chloride 0.9 % 100 mL IVPB        3 g 200 mL/hr over 30 Minutes Intravenous Every  12 hours 12/07/20 1542     12/07/20 1130  vancomycin (VANCOREADY) IVPB 1500 mg/300 mL        1,500 mg 150 mL/hr over 120 Minutes Intravenous  Once 12/07/20 1031 12/07/20 1618   12/03/20 0200  piperacillin-tazobactam (ZOSYN) IVPB 3.375 g  Status:  Discontinued        3.375 g 12.5 mL/hr over 240 Minutes Intravenous Every 8 hours 12/03/20 0121 12/07/20 1542   12/03/20 0100  piperacillin-tazobactam (ZOSYN) IVPB 3.375 g  Status:  Discontinued        3.375 g 100 mL/hr over 30 Minutes Intravenous  Once 12/03/20 0047 12/03/20 0121        Assessment/Plan POD# 3  Laparoscopic appendectomy for Acute appendicitis 9/12 Dr. Barry Dienes  - appendiceal orifice was sutured with a pursestring 2-0 vicryl using an Endostitch, drain was placed  adjacent to the cecum - zosyn was switched to unasyn and vancomycin yesterday, I don't think he needs the vancomycin.  - continue JP drain - output continues to be bloody, 495cc last 24 hours - WBC WNL, afebrile - With drop in h/h would prefer to keep holding heparin if ok with primary team. If it needs to be restarted he may require transfusion. Check CBC this afternoon   ID - zosyn 9/10>>9/14, unasyn 9/14>>, vancomycin 9/14>> FEN - IVF, NPO VTE - IV heparin held due to bleeding Foley - none   Mechanical mitral valve replacement on coumadin - continue IV heparin Permanent A Fib HTN CAD AKI on CKD IIIa - Cr up 1.91, on IVF CHF Cirrhosis   LOS: 5 days    Wellington Hampshire, [facility] 12/08/2020, 9:33 AM Please see Amion for pager number during day hours 7:00am-4:30pm

## 2020-12-08 NOTE — Progress Notes (Signed)
[facility] PROGRESS NOTE    Anthony Gould  [id number] DOB: 1933-10-01 DOA: 12/02/2020 PCP: Cassandria Anger, MD      Brief Narrative:  Anthony Gould is a 87 y.o. M with pAF on warfarin, hx mech mitral valve (Medtronic [place] titanium in 1986), CKD IIIa, pHTN, HTN, hx TIA who presented to PCP with RLQ pain, fever and reduced appetite, sent to ER.  In the ER, CT confirmed acute appendicitis.  Started on heparin and admitted.          Assessment & Plan:  Acute appendicitis S/P appendectomy 9/12 by Dr. Barry Dienes - Post-op care per Gen Surg - Continue Unasyn for 5-7 days, or per Gen Surg    Tachycardia, malaise due to AKI, acute post-operative blood loss anemia WBC up and malaise, and tachycardia 9/14.  Differential of post-op Afib, hemorrhage, infection.  Given fluids, symptoms improved.  Cultures negative.  I do not feel he had sepsis or infection.  We doubt significant hemorrhage, but suspect he had insensible loses from oozing after restarting heparin, this volume depletion led to afib and AKI and malaise.  This is all now resolving with fluids.  Would like to minimize time off anticoagulation, but 24-48 more hours is relatively low risk for stroke - Hold heparin until at least tomorrow - Trend CBC    Acute blood loss anemia due to post-op bleeding Hemorrhage doubted.  Suspect oozing, exacerbated by heparin. Heparin held yesterday morning, cautiously restarted last night, but had excessive rate of bleeding overnight and stopped again.  Hgb down 2 pts today.  - Hold heparin 24-48 hours - Trend CBC, then consider restart - When we restart heparin, I recommend 24 hours challenge with heparin before transitioning to the Lovenox bridge and intiiating warfarin at discharge     Acquired thrombophilia Medtronic-Hall mechanical valve Permanent atrial fibrillation Plan for Lovenox bridge at d/c.    Hr improved with fluids - Hold warfarin -  Continue home bispropolol - Continue IVF   Diabetes Diet controlled.  He has a chart history of DM, and lots of A1c's in the last, all of which are normal.  I assume this is resolved diabetes.  Here his sugars are fine. Glucose normal - Hold Aspirin - Continue Crestor  AKI on CKD stage IIIa Baseline Cr 1.2-1.4, here up to 1.9 today, stable from yesterday.  Due to contraction from oozing. - Continue IV F - Hold nephrotoxins, trend BMP  Elevated troponin Ischemia doubted, probably poor clearance due to renal insufficiency.  No ishcemic work up necesary  Hypertension BP controlled - Continue bisoprolol - Hold amlodipine, enalapril, Lasix  Other medications -Hold dipyridamole, indication unclear, last Cards note 12/08/19 says "no prior history of coronary disease in his chart"  Pulmonary hypertension - Hold Lasix  Hypokalemia Hypomagnesemia Supplemented and resolved   Hyponatremia Mild, asymptomatic  Gout - Continue allopurinol  GERD -Hold pantoprazole as not symptomatic                Disposition: Status is: Inpatient  Remains inpatient appropriate because:Inpatient level of care appropriate due to severity of illness  Dispo: The patient is from: Home              Anticipated d/c is to: Home              Patient currently is not medically stable to d/c.   Difficult to place patient No       Level of care: Progressive  MDM: The below labs and imaging reports were reviewed and summarized above.  Medication management as above.      DVT prophylaxis: Place and maintain sequential compression device Start: 12/06/20 1024  Code Status: FULL Family Communication: None present    Consultants:  Gen Surg Cardiology  Procedures:  9/12 appendectomy           Subjective: Abdominal pain is improved.  No chest discomfort, dyspnea.  His palpitations from yesterday are resolved.  He is very tired and weak, but no fever, chills,  dysuria.     Objective: Vitals:   12/07/20 2355 12/08/20 0242 12/08/20 0529 12/08/20 0728  BP: 124/69  (!) 106/59 117/65  Pulse:   97 (!) 102  Resp: 18   18  Temp: (!) 97.5 F (36.4 C)  (!) 97.5 F (36.4 C) 98.1 F (36.7 C)  TempSrc: Oral  Oral Oral  SpO2: 96%  99% 100%  Weight:  76.9 kg    Height:        Intake/Output Summary (Last 24 hours) at 12/08/2020 1026 Last data filed at 12/08/2020 0915 Gross per 24 hour  Intake 812.25 ml  Output 615 ml  Net 197.25 ml   Filed Weights   12/03/20 1700 12/05/20 1323 12/08/20 0242  Weight: 74 kg 74 kg 76.9 kg    Examination: General appearance: Elderly adult male, lying in bed, no acute distress, interactive     HEENT: Anicteric, conjunctival pink, lids and lashes normal.  No nasal deformity, discharge, or epistaxis Skin: Warm and dry, no suspicious rashes or lesions Cardiac: Slightly tachycardic, irregular, his mitral valve click is noted, he has no JVD or lower extremity edema Respiratory: Normal respiratory rate and rhythm, lungs clear without rales or wheezes Abdomen: Abdomen soft has mild nonfocal tenderness, some voluntary guarding, but no rigidity, rebound, no distention MSK:  Neuro: Awake and alert, extraocular movements intact, moves upper extremities with generalized weakness but symmetric strength, speech fluent, face symmetric Psych: Attention normal, affect appropriate, judgment insight normal    Data Reviewed: I have personally reviewed following labs and imaging studies:  CBC: Recent Labs  Lab 12/05/20 0857 12/06/20 0544 12/07/20 0149 12/07/20 1001 12/08/20 0358  WBC 6.2 5.3 17.2* 12.7* 10.1  NEUTROABS 4.2 4.5 13.3* 10.0* 7.8*  HGB 12.0* 11.5* 12.6* 10.8* 8.3*  HCT 35.1* 33.2* 37.2* 31.4* 23.9*  MCV 98.0 96.0 97.9 97.2 98.8  PLT 230 202 303 257 [id number]   Basic Metabolic Panel: Recent Labs  Lab 12/03/20 0147 12/04/20 0041 12/06/20 0544 12/06/20 0554 12/07/20 0149 12/07/20 1001 12/08/20 0358  NA 129*  134* 137  --   --  133* 133*  K 3.8 3.8 3.0*  --  3.4* 3.8 5.1  CL 98 101 99  --   --  96* 100  CO2 21* 22 28  --   --  25 24  GLUCOSE 119* 86 202*  --   --  135* 151*  BUN '17 16 12  '$ --   --  17 21  CREATININE 1.26* 1.09 1.23  --   --  1.85* 1.91*  CALCIUM 9.0 8.9 8.3*  --   --  8.5* 8.4*  MG  --   --   --  1.3* 1.3* 1.1*  --    GFR: Estimated Creatinine Clearance: 29.2 mL/min (A) (by C-G formula based on SCr of 1.91 mg/dL (H)). Liver Function Tests: Recent Labs  Lab 12/02/20 1143 12/03/20 0147 12/07/20 1001  AST 28 37 31  ALT 18  27 21  ALKPHOS 77 78 61  BILITOT 2.0* 1.9* 1.7*  PROT 6.8 6.6 5.6*  ALBUMIN 3.9 3.6 2.9*   No results for input(s): LIPASE, AMYLASE in the last 168 hours. No results for input(s): AMMONIA in the last 168 hours. Coagulation Profile: Recent Labs  Lab 12/04/20 0041 12/05/20 0857 12/06/20 0544 12/07/20 0149 12/08/20 0358  INR 1.7* 1.3* 1.4* 1.6* 1.7*   Cardiac Enzymes: No results for input(s): CKTOTAL, CKMB, CKMBINDEX, TROPONINI in the last 168 hours. BNP (last 3 results) No results for input(s): PROBNP in the last 8760 hours. HbA1C: No results for input(s): HGBA1C in the last 72 hours. CBG: Recent Labs  Lab 12/06/20 1745 12/07/20 1136 12/07/20 1534 12/08/20 0001 12/08/20 0526  GLUCAP 129* 126* 112* 109* 121*   Lipid Profile: No results for input(s): CHOL, HDL, LDLCALC, TRIG, CHOLHDL, LDLDIRECT in the last 72 hours. Thyroid Function Tests: No results for input(s): TSH, T4TOTAL, FREET4, T3FREE, THYROIDAB in the last 72 hours. Anemia Panel: No results for input(s): VITAMINB12, FOLATE, FERRITIN, TIBC, IRON, RETICCTPCT in the last 72 hours. Urine analysis:    Component Value Date/Time   COLORURINE YELLOW 12/02/2020 [place] 12/02/2020 1143   LABSPEC 1.015 12/02/2020 1143   PHURINE 5.0 12/02/2020 1143   GLUCOSEU NEGATIVE 12/02/2020 1143   HGBUR NEGATIVE 12/02/2020 1143   BILIRUBINUR NEGATIVE 12/02/2020 1143    KETONESUR NEGATIVE 12/02/2020 1143   PROTEINUR 30 (A) 08/07/2013 1633   UROBILINOGEN 0.2 12/02/2020 1143   NITRITE NEGATIVE 12/02/2020 1143   LEUKOCYTESUR NEGATIVE 12/02/2020 1143   Sepsis Labs: '@LABRCNTIP'$ (procalcitonin:4,lacticacidven:4)  ) Recent Results (from the past 240 hour(s))  Resp Panel by RT-PCR (Flu A&B, Covid) Nasopharyngeal Swab     Status: None   Collection Time: 12/02/20  7:15 PM   Specimen: Nasopharyngeal Swab; Nasopharyngeal(NP) swabs in vial transport medium  Result Value Ref Range Status   SARS Coronavirus 2 by RT PCR NEGATIVE NEGATIVE Final    Comment: (NOTE) SARS-CoV-2 target nucleic acids are NOT DETECTED.  The SARS-CoV-2 RNA is generally detectable in upper respiratory specimens during the acute phase of infection. The lowest concentration of SARS-CoV-2 viral copies this assay can detect is 138 copies/mL. A negative result does not preclude SARS-Cov-2 infection and should not be used as the sole basis for treatment or other patient management decisions. A negative result may occur with  improper specimen collection/handling, submission of specimen other than nasopharyngeal swab, presence of viral mutation(s) within the areas targeted by this assay, and inadequate number of viral copies(<138 copies/mL). A negative result must be combined with clinical observations, patient history, and epidemiological information. The expected result is Negative.  Fact Sheet for Patients:  EntrepreneurPulse.com.au  Fact Sheet for Healthcare Providers:  IncredibleEmployment.be  This test is no t yet approved or cleared by the [place] FDA and  has been authorized for detection and/or diagnosis of SARS-CoV-2 by FDA under an Emergency Use Authorization (EUA). This EUA will remain  in effect (meaning this test can be used) for the duration of the COVID-19 declaration under Section 564(b)(1) of the Act, 21 U.S.C.section  360bbb-3(b)(1), unless the authorization is terminated  or revoked sooner.       Influenza A by PCR NEGATIVE NEGATIVE Final   Influenza B by PCR NEGATIVE NEGATIVE Final    Comment: (NOTE) The Xpert Xpress SARS-CoV-2/FLU/RSV plus assay is intended as an aid in the diagnosis of influenza from Nasopharyngeal swab specimens and should not be used as a sole basis for treatment.  Nasal washings and aspirates are unacceptable for Xpert Xpress SARS-CoV-2/FLU/RSV testing.  Fact Sheet for Patients: EntrepreneurPulse.com.au  Fact Sheet for Healthcare Providers: IncredibleEmployment.be  This test is not yet approved or cleared by the [place] FDA and has been authorized for detection and/or diagnosis of SARS-CoV-2 by FDA under an Emergency Use Authorization (EUA). This EUA will remain in effect (meaning this test can be used) for the duration of the COVID-19 declaration under Section 564(b)(1) of the Act, 21 U.S.C. section 360bbb-3(b)(1), unless the authorization is terminated or revoked.  Performed at [facility], [place] [address] [place], [place] [address]   Surgical pcr screen     Status: None   Collection Time: 12/04/20  6:10 PM   Specimen: Nasal Mucosa; Nasal Swab  Result Value Ref Range Status   MRSA, PCR NEGATIVE NEGATIVE Final   Staphylococcus aureus NEGATIVE NEGATIVE Final    Comment: (NOTE) The Xpert SA Assay (FDA approved for NASAL specimens in patients 66 years of age and older), is one component of a comprehensive surveillance program. It is not intended to diagnose infection nor to guide or monitor treatment. Performed at [facility], [place] [address] [place], [place] [address]   Culture, blood (x 2)     Status: None (Preliminary result)   Collection Time: 12/07/20 10:00 AM   Specimen: BLOOD  Result Value Ref Range Status   Specimen Description BLOOD RIGHT ANTECUBITAL  Final   Special Requests   Final    BOTTLES  DRAWN AEROBIC AND ANAEROBIC Blood Culture adequate volume   Culture   Final    NO GROWTH < 24 HOURS Performed at [facility], [place] [address] [place], [place] [address]    Report Status PENDING  Incomplete  Culture, blood (x 2)     Status: None (Preliminary result)   Collection Time: 12/07/20 10:20 AM   Specimen: BLOOD  Result Value Ref Range Status   Specimen Description BLOOD RIGHT ANTECUBITAL  Final   Special Requests   Final    BOTTLES DRAWN AEROBIC AND ANAEROBIC Blood Culture adequate volume   Culture   Final    NO GROWTH < 24 HOURS Performed at [facility], [place] [address] [place], [place] [address]    Report Status PENDING  Incomplete         Radiology Studies: No results found.      Scheduled Meds:  allopurinol  300 mg Oral Daily   atorvastatin  20 mg Oral Once per day on Wed Sat   bisoprolol  2.5 mg Oral Daily   feeding supplement  1 Container Oral TID BM   latanoprost  1 drop Both Eyes QHS   Continuous Infusions:  ampicillin-sulbactam (UNASYN) IV 3 g (12/08/20 0629)   lactated ringers 125 mL/hr at 12/08/20 1018     LOS: 5 days    Time spent: 35 minutes    Edwin Dada, MD [facility] 12/08/2020, 10:26 AM     Please page though [place] or Epic secure chat:  For [employer], Adult nurse

## 2020-12-09 DIAGNOSIS — Z952 Presence of prosthetic heart valve: Secondary | ICD-10-CM | POA: Diagnosis not present

## 2020-12-09 DIAGNOSIS — K3580 Unspecified acute appendicitis: Secondary | ICD-10-CM | POA: Diagnosis not present

## 2020-12-09 DIAGNOSIS — I10 Essential (primary) hypertension: Secondary | ICD-10-CM | POA: Diagnosis not present

## 2020-12-09 DIAGNOSIS — I4821 Permanent atrial fibrillation: Secondary | ICD-10-CM | POA: Diagnosis not present

## 2020-12-09 LAB — BASIC METABOLIC PANEL
Anion gap: 6 (ref 5–15)
BUN: 24 mg/dL — ABNORMAL HIGH (ref 8–23)
CO2: 24 mmol/L (ref 22–32)
Calcium: 8.1 mg/dL — ABNORMAL LOW (ref 8.9–10.3)
Chloride: 104 mmol/L (ref 98–111)
Creatinine, Ser: 1.65 mg/dL — ABNORMAL HIGH (ref 0.61–1.24)
GFR, Estimated: 40 mL/min — ABNORMAL LOW (ref >60)
Glucose, Bld: 108 mg/dL — ABNORMAL HIGH (ref 70–99)
Potassium: 4.1 mmol/L (ref 3.5–5.1)
Sodium: 134 mmol/L — ABNORMAL LOW (ref 135–145)

## 2020-12-09 LAB — CBC WITH DIFFERENTIAL/PLATELET
Abs Immature Granulocytes: 0.22 10*3/uL — ABNORMAL HIGH (ref 0.00–0.07)
Basophils Absolute: 0.1 10*3/uL (ref 0.0–0.1)
Basophils Relative: 1 %
Eosinophils Absolute: 0.1 10*3/uL (ref 0.0–0.5)
Eosinophils Relative: 2 %
HCT: 19.4 % — ABNORMAL LOW (ref 39.0–52.0)
Hemoglobin: 6.5 g/dL — CL (ref 13.0–17.0)
Immature Granulocytes: 2 %
Lymphocytes Relative: 17 %
Lymphs Abs: 1.5 10*3/uL (ref 0.7–4.0)
MCH: 33.2 pg (ref 26.0–34.0)
MCHC: 33.5 g/dL (ref 30.0–36.0)
MCV: 99 fL (ref 80.0–100.0)
Monocytes Absolute: 0.7 10*3/uL (ref 0.1–1.0)
Monocytes Relative: 7 %
Neutro Abs: 6.6 10*3/uL (ref 1.7–7.7)
Neutrophils Relative %: 71 %
Platelets: 185 10*3/uL (ref 150–400)
RBC: 1.96 MIL/uL — ABNORMAL LOW (ref 4.22–5.81)
RDW: 14.1 % (ref 11.5–15.5)
WBC: 9.2 10*3/uL (ref 4.0–10.5)
nRBC: 0 % (ref 0.0–0.2)

## 2020-12-09 LAB — GLUCOSE, CAPILLARY
Glucose-Capillary: 105 mg/dL — ABNORMAL HIGH (ref 70–99)
Glucose-Capillary: 107 mg/dL — ABNORMAL HIGH (ref 70–99)
Glucose-Capillary: 114 mg/dL — ABNORMAL HIGH (ref 70–99)
Glucose-Capillary: 117 mg/dL — ABNORMAL HIGH (ref 70–99)
Glucose-Capillary: 130 mg/dL — ABNORMAL HIGH (ref 70–99)

## 2020-12-09 LAB — PREPARE RBC (CROSSMATCH)

## 2020-12-09 LAB — MAGNESIUM: Magnesium: 2 mg/dL (ref 1.7–2.4)

## 2020-12-09 LAB — PROTIME-INR
INR: 1.9 — ABNORMAL HIGH (ref 0.8–1.2)
Prothrombin Time: 21.4 seconds — ABNORMAL HIGH (ref 11.4–15.2)

## 2020-12-09 LAB — HEMOGLOBIN AND HEMATOCRIT, BLOOD
HCT: 23.2 % — ABNORMAL LOW (ref 39.0–52.0)
Hemoglobin: 8 g/dL — ABNORMAL LOW (ref 13.0–17.0)

## 2020-12-09 LAB — PHOSPHORUS: Phosphorus: 2.9 mg/dL (ref 2.5–4.6)

## 2020-12-09 MED ORDER — SODIUM CHLORIDE 0.9 % IV SOLN
3.0000 g | Freq: Three times a day (TID) | INTRAVENOUS | Status: DC
  Administered 2020-12-09 – 2020-12-14 (×16): 3 g via INTRAVENOUS
  Filled 2020-12-09 (×17): qty 8

## 2020-12-09 MED ORDER — SODIUM CHLORIDE 0.9% IV SOLUTION: Freq: Once | INTRAVENOUS | Status: AC

## 2020-12-09 NOTE — TOC Progression Note (Signed)
Transition of Care [facility]) - Progression Note    Patient Details  Name: Anthony Gould MRN: [id number] Date of Birth: 1934/01/26  [facility] CM/SW Contact  Zenon Mayo, RN Phone Number: 12/09/2020, 4:19 PM  Clinical Narrative:    S/p appendectomy perer pt eval, no pt needed, afib rvr, bleeding, iiv abx's, jp drains, not sure if will go home with drains. Today hgb is down, will need transfusion. Conts on iv abx. TOC will cont to tollow for dc needs.        Expected Discharge Plan and Services                                                 Social Determinants of Health (SDOH) Interventions    Readmission Risk Interventions No flowsheet data found.

## 2020-12-09 NOTE — Progress Notes (Signed)
PHARMACY NOTE:  ANTIMICROBIAL RENAL DOSAGE ADJUSTMENT  Current antimicrobial regimen includes a mismatch between antimicrobial dosage and estimated renal function.  As per policy approved by the Pharmacy & Therapeutics and Medical Executive Committees, the antimicrobial dosage will be adjusted accordingly.  Current antimicrobial dosage:  Unasyn 3gm IV Q12H  Indication: acute appendicitis  Renal Function:  Estimated Creatinine Clearance: 33.8 mL/min (A) (by C-G formula based on SCr of 1.65 mg/dL (H)). '[]'$      On intermittent HD, scheduled: '[]'$      On CRRT    Antimicrobial dosage has been changed to:  Unasyn 3gm IV Q8H  Teighlor Korson D. Mina Marble, PharmD, BCPS, [place] 12/09/2020, 8:34 AM

## 2020-12-09 NOTE — Progress Notes (Signed)
Mobility Specialist Progress Note   12/09/20 1207  Mobility  Activity Ambulated in room  Level of Assistance Contact guard assist, steadying assist  Assistive Device Front wheel walker  Distance Ambulated (ft) 60 ft  Mobility Ambulated with assistance in room  Mobility Response Tolerated well  Mobility performed by Mobility specialist  $Mobility charge 1 Mobility   Pt found sitting EOB agreeable to mobility session. Pt had no c/o sx. Ambulated in room d/t how pt responded to last session. Mod I throughout, reported no sx at the end. Pt returned back EOB w/ call bell by side and wife in room.  Pre Mobility: 132/80 BP Post Mobility: 145/88 BP  Holland Falling Mobility Specialist Phone Number [phone]

## 2020-12-09 NOTE — Progress Notes (Addendum)
[facility] Progress Note  4 Days Post-Op  Subjective: CC-  Hgb 6.5 this AM, he has agreed to a blood transfusion but this has not been started yet. States that he feels better than yesterday. RLQ soreness improved. Tolerating clears. Denies n/v. JP drainage decreased in volume. HR improved (90s-100s) and no hypotension.  Objective: Vital signs in last 24 hours: Temp:  [98 F (36.7 C)-98.8 F (37.1 C)] 98 F (36.7 C) (09/16 0729) Pulse Rate:  [80-99] 97 (09/16 0729) Resp:  [16-18] 17 (09/16 0729) BP: (116-153)/(52-89) 130/52 (09/16 0729) SpO2:  [96 %-100 %] 96 % (09/16 0729) Weight:  [79.2 kg] 79.2 kg (09/16 0000) Last BM Date: 12/06/20  Intake/Output from previous day: 09/15 0701 - 09/16 0700 In: 360 [P.O.:360] Out: 280 [Urine:250; Drains:30] Intake/Output this shift: No intake/output data recorded.  PE: Gen:  Alert, NAD, pleasant Card:  HR 90s-100s Pulm:  rate and effort normal Abd: Soft, minimal distension, incisions cdi with steri strips in place, very mild TTP RLQ without rebound or guarding, JP drain with small amount of sanguinous fluid in bulb Psych: A&Ox3  Lab Results:  Recent Labs    12/08/20 1351 12/09/20 0244  WBC 11.3* 9.2  HGB 7.5* 6.5*  HCT 22.0* 19.4*  PLT 185 185   BMET Recent Labs    12/08/20 0358 12/09/20 0244  NA 133* 134*  K 5.1 4.1  CL 100 104  CO2 24 24  GLUCOSE 151* 108*  BUN 21 24*  CREATININE 1.91* 1.65*  CALCIUM 8.4* 8.1*   PT/INR Recent Labs    12/08/20 0358 12/09/20 0244  LABPROT 20.3* 21.4*  INR 1.7* 1.9*   CMP     Component Value Date/Time   NA 134 (L) 12/09/2020 0244   K 4.1 12/09/2020 0244   CL 104 12/09/2020 0244   CO2 24 12/09/2020 0244   GLUCOSE 108 (H) 12/09/2020 0244   BUN 24 (H) 12/09/2020 0244   CREATININE 1.65 (H) 12/09/2020 0244   CREATININE 1.40 (H) 04/01/2015 1540   CALCIUM 8.1 (L) 12/09/2020 0244   PROT 5.6 (L) 12/07/2020 1001   ALBUMIN 2.9 (L) 12/07/2020 1001   AST 31  12/07/2020 1001   ALT 21 12/07/2020 1001   ALKPHOS 61 12/07/2020 1001   BILITOT 1.7 (H) 12/07/2020 1001   GFRNONAA 40 (L) 12/09/2020 0244   GFRAA 107 11/17/2007 1231   Lipase     Component Value Date/Time   LIPASE 15.0 12/18/2017 0828       Studies/Results: No results found.  Anti-infectives: Anti-infectives (From admission, onward)    Start     Dose/Rate Route Frequency Ordered Stop   12/09/20 1330  vancomycin (VANCOREADY) IVPB 1500 mg/300 mL  Status:  Discontinued        1,500 mg 150 mL/hr over 120 Minutes Intravenous Every 48 hours 12/07/20 1531 12/08/20 0835   12/09/20 0900  Ampicillin-Sulbactam (UNASYN) 3 g in sodium chloride 0.9 % 100 mL IVPB        3 g 200 mL/hr over 30 Minutes Intravenous Every 8 hours 12/09/20 0836     12/08/20 1100  vancomycin (VANCOCIN) IVPB 1000 mg/200 mL premix  Status:  Discontinued        1,000 mg 200 mL/hr over 60 Minutes Intravenous Every 24 hours 12/07/20 1033 12/07/20 1531   12/07/20 1900  Ampicillin-Sulbactam (UNASYN) 3 g in sodium chloride 0.9 % 100 mL IVPB  Status:  Discontinued        3 g 200 mL/hr over 30 Minutes  Intravenous Every 12 hours 12/07/20 1542 12/09/20 0836   12/07/20 1130  vancomycin (VANCOREADY) IVPB 1500 mg/300 mL        1,500 mg 150 mL/hr over 120 Minutes Intravenous  Once 12/07/20 1031 12/07/20 1618   12/03/20 0200  piperacillin-tazobactam (ZOSYN) IVPB 3.375 g  Status:  Discontinued        3.375 g 12.5 mL/hr over 240 Minutes Intravenous Every 8 hours 12/03/20 0121 12/07/20 1542   12/03/20 0100  piperacillin-tazobactam (ZOSYN) IVPB 3.375 g  Status:  Discontinued        3.375 g 100 mL/hr over 30 Minutes Intravenous  Once 12/03/20 0047 12/03/20 0121        Assessment/Plan POD# 4  Laparoscopic appendectomy for Acute appendicitis 9/12 Dr. Barry Dienes  - appendiceal orifice was sutured with a pursestring 2-0 vicryl using an Endostitch, drain was placed adjacent to the cecum - likely bled from this suture line but does  not appear to be bleeding any more. Vital signs are stable. JP drain output has decreased. Hgb was likely still equilibrating, it is 6.5 and he is going to get a blood transfusion.  - continue holding IV heparin - continue JP drain and monitor output - plan to continue IV antibiotics while admitted. Will not need antibiotics at discharge - advance to full liquids   ID - zosyn 9/10>>9/14, unasyn 9/14>>, vancomycin 9/14>>9/15 FEN - IVF, FLD VTE - IV heparin held due to bleeding Foley - none   Mechanical mitral valve replacement on coumadin - continue IV heparin Permanent A Fib HTN CAD AKI on CKD IIIa - Cr 1.91 > 1.65, improving on IVF CHF Cirrhosis   LOS: 6 days    Wellington Hampshire, [facility] 12/09/2020, 9:44 AM Please see Amion for pager number during day hours 7:00am-4:30pm

## 2020-12-09 NOTE — Progress Notes (Signed)
Critical lab result of Hgb 6.5. Messaged Dr. Hal Hope who called in return to discuss with pt about a blood transfusion. Pt was told the benefits and risks of blood transfusion. Pt refused to consent for transfusion at this time. Order placed for repeat CBC at 8am.

## 2020-12-09 NOTE — Progress Notes (Addendum)
[facility] PROGRESS NOTE    Anthony Gould  [id number] DOB: 01/13/1934 DOA: 12/02/2020 PCP: Cassandria Anger, MD      Brief Narrative:  Anthony Gould is a 87 y.o. M with pAF on warfarin, hx mech mitral valve (Medtronic [place] titanium in 1986), CKD IIIa, pHTN, HTN, hx TIA who presented to PCP with RLQ pain, fever and reduced appetite, sent to ER.  In the ER, CT confirmed acute appendicitis.  INR reversed, started on heparin and admitted for surgery.          Assessment & Plan:  Acute appendicitis S/P appendectomy 9/12 by Dr. Barry Dienes - Post-op care per Gen Surg - Continue Unasyn, day 4 post-op, they plan to continue for 7 days or until discharge    Acute post-op blood loss anemia WBC up and malaise, and tachycardia 9/14.  Given fluids, symptoms improved.  Cultures negative, no other focal signs/symptoms of infection.  Sepsis ruled out.    Likely some post-op oozing from appendix sutures led to hypovolemia and rapid Afib.  The bleeding clinically is stopped (very little drainage in JP last night per patient), but Hgb down to 6.5 g/dL today  - Hold heparin one more day  - Transfuse 1 unit PRBCs today - Trend CBC  - When we restart heparin, I still feel we should do 24 hours challenge with heparin before transitioning to the Lovenox bridge and intiiating warfarin at discharge     Acquired thrombophilia Medtronic-Hall mechanical valve Permanent atrial fibrillation HR in the 90s-100s today. Plan for Lovenox bridge at d/c.    - Hold warfarin - Hold Heparin, perhaps restart tomorrow if Hgb stable from post-transfusion H/H this afternoon to AM CBC  - Continue home bisoprolol     Diabetes Diet controlled.  He has a chart history of DM, and lots of A1c's in the last, all of which are normal.  I assume this is resolved diabetes.    Glucose normal - Hold aspirin - Continue Crestor  AKI on CKD stage IIIa Baseline Cr 1.2-1.4, here up to  1.9, improved today with fluids.   Due to contraction from oozing. - Stop IVF - Oral hydration - Hold nephrotoxins - Trend BMP  Elevated troponin Ischemia doubted, probably poor clearance due to renal insufficiency.  No ishcemic work up necesary  Hypertension BP normal - Continue bisoprolol - Hold amlodipine, enalapril, Lasix  Other medications -Hold dipyridamole, indication unclear, last Cards note 12/08/19 says "no prior history of coronary disease in his chart"  Pulmonary hypertension - Hold Lasix  Hypokalemia Hypomagnesemia Supplemented and resolved   Hyponatremia Mild, asymptomatic  Gout - Continue allopurinol  GERD -Hold pantoprazole as not symptomatic                Disposition: Status is: Inpatient  Remains inpatient appropriate because:Inpatient level of care appropriate due to severity of illness  Dispo: The patient is from: Home              Anticipated d/c is to: Home              Patient currently is not medically stable to d/c.   Difficult to place patient No       Level of care: Progressive       MDM: The below labs and imaging reports were reviewed and summarized above.  Medication management as above.      DVT prophylaxis: Place and maintain sequential compression device Start: 12/06/20 1024  Code Status: FULL  Family Communication: None present    Consultants:  Gen Surg Cardiology  Procedures:  9/12 appendectomy           Subjective: No abdominal pain.  No rigidity or rebound.  Output from his JP drain is reducing.  No other clinical bleeding.  Feels weak and tired with standing, but able to sit in the chair for a while yesterday.  No palpitations, chest pain.   no fever, chills, dysuria.     Objective: Vitals:   12/09/20 0000 12/09/20 0729 12/09/20 1212 12/09/20 1241  BP: 127/64 (!) 130/52 (!) 151/77 138/64  Pulse: 80 97 85 87  Resp: '18 17 18 18  '$ Temp: 98.3 F (36.8 C) 98 F (36.7 C) 98.1 F (36.7  C) 98.1 F (36.7 C)  TempSrc: Oral Oral Oral Oral  SpO2: 96% 96% 98% 94%  Weight: 79.2 kg     Height:        Intake/Output Summary (Last 24 hours) at 12/09/2020 1507 Last data filed at 12/09/2020 1212 Gross per 24 hour  Intake 580 ml  Output 260 ml  Net 320 ml   Filed Weights   12/05/20 1323 12/08/20 0242 12/09/20 0000  Weight: 74 kg 76.9 kg 79.2 kg    Examination: General appearance: Elderly male, lying in bed, no acute distress     HEENT: Anicteric, conjunctival pink, lids and lashes normal, no nasal deformity, discharge, epistaxis Skin: Skin warm and dry, no suspicious rashes or lesions Cardiac: Irregularly irregular, mild tachycardia, mitral valve click noted, no JVD or extremity edema. Respiratory: Normal respiratory rate and rhythm, lungs clear without rales or wheezes Abdomen: Abdomen soft has mild nonfocal tenderness, some voluntary guarding, but no rigidity, rebound, no distention MSK:  Neuro: Awake and alert, extraocular movements intact, moves upper extremities with generalized weakness but symmetric strength, speech fluent, face symmetric Psych: Attention normal, affect appropriate, judgment insight normal    Data Reviewed: I have personally reviewed following labs and imaging studies:  CBC: Recent Labs  Lab 12/06/20 0544 12/07/20 0149 12/07/20 1001 12/08/20 0358 12/08/20 1351 12/09/20 0244  WBC 5.3 17.2* 12.7* 10.1 11.3* 9.2  NEUTROABS 4.5 13.3* 10.0* 7.8*  --  6.6  HGB 11.5* 12.6* 10.8* 8.3* 7.5* 6.5*  HCT 33.2* 37.2* 31.4* 23.9* 22.0* 19.4*  MCV 96.0 97.9 97.2 98.8 98.2 99.0  PLT 202 303 257 207 185 [id number]   Basic Metabolic Panel: Recent Labs  Lab 12/04/20 0041 12/06/20 0544 12/06/20 0554 12/07/20 0149 12/07/20 1001 12/08/20 0358 12/09/20 0244  NA 134* 137  --   --  133* 133* 134*  K 3.8 3.0*  --  3.4* 3.8 5.1 4.1  CL 101 99  --   --  96* 100 104  CO2 22 28  --   --  '25 24 24  '$ GLUCOSE 86 202*  --   --  135* 151* 108*  BUN 16 12  --   --  17  21 24*  CREATININE 1.09 1.23  --   --  1.85* 1.91* 1.65*  CALCIUM 8.9 8.3*  --   --  8.5* 8.4* 8.1*  MG  --   --  1.3* 1.3* 1.1*  --  2.0  PHOS  --   --   --   --   --   --  2.9   GFR: Estimated Creatinine Clearance: 33.8 mL/min (A) (by C-G formula based on SCr of 1.65 mg/dL (H)). Liver Function Tests: Recent Labs  Lab 12/03/20 0147 12/07/20 1001  AST  37 31  ALT 27 21  ALKPHOS 78 61  BILITOT 1.9* 1.7*  PROT 6.6 5.6*  ALBUMIN 3.6 2.9*   No results for input(s): LIPASE, AMYLASE in the last 168 hours. No results for input(s): AMMONIA in the last 168 hours. Coagulation Profile: Recent Labs  Lab 12/05/20 0857 12/06/20 0544 12/07/20 0149 12/08/20 0358 12/09/20 0244  INR 1.3* 1.4* 1.6* 1.7* 1.9*   Cardiac Enzymes: No results for input(s): CKTOTAL, CKMB, CKMBINDEX, TROPONINI in the last 168 hours. BNP (last 3 results) No results for input(s): PROBNP in the last 8760 hours. HbA1C: No results for input(s): HGBA1C in the last 72 hours. CBG: Recent Labs  Lab 12/08/20 1207 12/08/20 1819 12/09/20 0000 12/09/20 0546 12/09/20 1212  GLUCAP 128* 179* 114* 107* 130*   Lipid Profile: No results for input(s): CHOL, HDL, LDLCALC, TRIG, CHOLHDL, LDLDIRECT in the last 72 hours. Thyroid Function Tests: No results for input(s): TSH, T4TOTAL, FREET4, T3FREE, THYROIDAB in the last 72 hours. Anemia Panel: No results for input(s): VITAMINB12, FOLATE, FERRITIN, TIBC, IRON, RETICCTPCT in the last 72 hours. Urine analysis:    Component Value Date/Time   COLORURINE YELLOW 12/02/2020 [place] 12/02/2020 1143   LABSPEC 1.015 12/02/2020 1143   PHURINE 5.0 12/02/2020 1143   GLUCOSEU NEGATIVE 12/02/2020 1143   HGBUR NEGATIVE 12/02/2020 1143   BILIRUBINUR NEGATIVE 12/02/2020 1143   KETONESUR NEGATIVE 12/02/2020 1143   PROTEINUR 30 (A) 08/07/2013 1633   UROBILINOGEN 0.2 12/02/2020 1143   NITRITE NEGATIVE 12/02/2020 1143   LEUKOCYTESUR NEGATIVE 12/02/2020 1143   Sepsis  Labs: '@LABRCNTIP'$ (procalcitonin:4,lacticacidven:4)  ) Recent Results (from the past 240 hour(s))  Resp Panel by RT-PCR (Flu A&B, Covid) Nasopharyngeal Swab     Status: None   Collection Time: 12/02/20  7:15 PM   Specimen: Nasopharyngeal Swab; Nasopharyngeal(NP) swabs in vial transport medium  Result Value Ref Range Status   SARS Coronavirus 2 by RT PCR NEGATIVE NEGATIVE Final    Comment: (NOTE) SARS-CoV-2 target nucleic acids are NOT DETECTED.  The SARS-CoV-2 RNA is generally detectable in upper respiratory specimens during the acute phase of infection. The lowest concentration of SARS-CoV-2 viral copies this assay can detect is 138 copies/mL. A negative result does not preclude SARS-Cov-2 infection and should not be used as the sole basis for treatment or other patient management decisions. A negative result may occur with  improper specimen collection/handling, submission of specimen other than nasopharyngeal swab, presence of viral mutation(s) within the areas targeted by this assay, and inadequate number of viral copies(<138 copies/mL). A negative result must be combined with clinical observations, patient history, and epidemiological information. The expected result is Negative.  Fact Sheet for Patients:  EntrepreneurPulse.com.au  Fact Sheet for Healthcare Providers:  IncredibleEmployment.be  This test is no t yet approved or cleared by the [place] FDA and  has been authorized for detection and/or diagnosis of SARS-CoV-2 by FDA under an Emergency Use Authorization (EUA). This EUA will remain  in effect (meaning this test can be used) for the duration of the COVID-19 declaration under Section 564(b)(1) of the Act, 21 U.S.C.section 360bbb-3(b)(1), unless the authorization is terminated  or revoked sooner.       Influenza A by PCR NEGATIVE NEGATIVE Final   Influenza B by PCR NEGATIVE NEGATIVE Final    Comment: (NOTE) The Xpert  Xpress SARS-CoV-2/FLU/RSV plus assay is intended as an aid in the diagnosis of influenza from Nasopharyngeal swab specimens and should not be used as a sole basis for treatment.  Nasal washings and aspirates are unacceptable for Xpert Xpress SARS-CoV-2/FLU/RSV testing.  Fact Sheet for Patients: EntrepreneurPulse.com.au  Fact Sheet for Healthcare Providers: IncredibleEmployment.be  This test is not yet approved or cleared by the [place] FDA and has been authorized for detection and/or diagnosis of SARS-CoV-2 by FDA under an Emergency Use Authorization (EUA). This EUA will remain in effect (meaning this test can be used) for the duration of the COVID-19 declaration under Section 564(b)(1) of the Act, 21 U.S.C. section 360bbb-3(b)(1), unless the authorization is terminated or revoked.  Performed at [facility], [place] [address] [place], [place] [address]   Surgical pcr screen     Status: None   Collection Time: 12/04/20  6:10 PM   Specimen: Nasal Mucosa; Nasal Swab  Result Value Ref Range Status   MRSA, PCR NEGATIVE NEGATIVE Final   Staphylococcus aureus NEGATIVE NEGATIVE Final    Comment: (NOTE) The Xpert SA Assay (FDA approved for NASAL specimens in patients 49 years of age and older), is one component of a comprehensive surveillance program. It is not intended to diagnose infection nor to guide or monitor treatment. Performed at [facility], [place] [address], [place], [place] [address]   Urine Culture     Status: None   Collection Time: 12/07/20  9:44 AM   Specimen: Urine, Catheterized  Result Value Ref Range Status   Specimen Description URINE, CATHETERIZED  Final   Special Requests NONE  Final   Culture   Final    NO GROWTH Performed at [place] [address], [place], [place] [address]    Report Status 12/08/2020 FINAL  Final  Culture, blood (x 2)     Status: None (Preliminary result)   Collection Time:  12/07/20 10:00 AM   Specimen: BLOOD  Result Value Ref Range Status   Specimen Description BLOOD RIGHT ANTECUBITAL  Final   Special Requests   Final    BOTTLES DRAWN AEROBIC AND ANAEROBIC Blood Culture adequate volume   Culture   Final    NO GROWTH 2 DAYS Performed at [facility], [place] [address] [place], [place] [address]    Report Status PENDING  Incomplete  Culture, blood (x 2)     Status: None (Preliminary result)   Collection Time: 12/07/20 10:20 AM   Specimen: BLOOD  Result Value Ref Range Status   Specimen Description BLOOD RIGHT ANTECUBITAL  Final   Special Requests   Final    BOTTLES DRAWN AEROBIC AND ANAEROBIC Blood Culture adequate volume   Culture   Final    NO GROWTH 2 DAYS Performed at [facility], [place] [address], [place],  [address]    Report Status PENDING  Incomplete         Radiology Studies: No results found.      Scheduled Meds:  allopurinol  300 mg Oral Daily   atorvastatin  20 mg Oral Once per day on Wed Sat   bisoprolol  2.5 mg Oral Daily   feeding supplement  1 Container Oral TID BM   latanoprost  1 drop Both Eyes QHS   Continuous Infusions:  ampicillin-sulbactam (UNASYN) IV 3 g (12/09/20 0933)     LOS: 6 days    Time spent: 35 minutes    Edwin Dada, MD [facility] 12/09/2020, 3:07 PM     Please page though [place] or Epic secure chat:  For [employer], Adult nurse

## 2020-12-10 DIAGNOSIS — I5032 Chronic diastolic (congestive) heart failure: Secondary | ICD-10-CM

## 2020-12-10 DIAGNOSIS — K353 Acute appendicitis with localized peritonitis, without perforation or gangrene: Secondary | ICD-10-CM | POA: Diagnosis not present

## 2020-12-10 DIAGNOSIS — K3580 Unspecified acute appendicitis: Secondary | ICD-10-CM | POA: Diagnosis not present

## 2020-12-10 DIAGNOSIS — I4821 Permanent atrial fibrillation: Secondary | ICD-10-CM | POA: Diagnosis not present

## 2020-12-10 LAB — CBC WITH DIFFERENTIAL/PLATELET
Abs Immature Granulocytes: 0.4 10*3/uL — ABNORMAL HIGH (ref 0.00–0.07)
Basophils Absolute: 0 10*3/uL (ref 0.0–0.1)
Basophils Relative: 1 %
Eosinophils Absolute: 0.3 10*3/uL (ref 0.0–0.5)
Eosinophils Relative: 4 %
HCT: 21.7 % — ABNORMAL LOW (ref 39.0–52.0)
Hemoglobin: 7.5 g/dL — ABNORMAL LOW (ref 13.0–17.0)
Immature Granulocytes: 6 %
Lymphocytes Relative: 18 %
Lymphs Abs: 1.2 10*3/uL (ref 0.7–4.0)
MCH: 33.2 pg (ref 26.0–34.0)
MCHC: 34.6 g/dL (ref 30.0–36.0)
MCV: 96 fL (ref 80.0–100.0)
Monocytes Absolute: 0.5 10*3/uL (ref 0.1–1.0)
Monocytes Relative: 8 %
Neutro Abs: 4.4 10*3/uL (ref 1.7–7.7)
Neutrophils Relative %: 63 %
Platelets: 163 10*3/uL (ref 150–400)
RBC: 2.26 MIL/uL — ABNORMAL LOW (ref 4.22–5.81)
RDW: 15.7 % — ABNORMAL HIGH (ref 11.5–15.5)
WBC: 6.9 10*3/uL (ref 4.0–10.5)
nRBC: 0.6 % — ABNORMAL HIGH (ref 0.0–0.2)

## 2020-12-10 LAB — BASIC METABOLIC PANEL
Anion gap: 11 (ref 5–15)
BUN: 21 mg/dL (ref 8–23)
CO2: 21 mmol/L — ABNORMAL LOW (ref 22–32)
Calcium: 8.2 mg/dL — ABNORMAL LOW (ref 8.9–10.3)
Chloride: 101 mmol/L (ref 98–111)
Creatinine, Ser: 1.33 mg/dL — ABNORMAL HIGH (ref 0.61–1.24)
GFR, Estimated: 52 mL/min — ABNORMAL LOW (ref >60)
Glucose, Bld: 90 mg/dL (ref 70–99)
Potassium: 3.9 mmol/L (ref 3.5–5.1)
Sodium: 133 mmol/L — ABNORMAL LOW (ref 135–145)

## 2020-12-10 LAB — BPAM RBC
Blood Product Expiration Date: 202209232359
ISSUE DATE / TIME: 202209161157
Unit Type and Rh: 6200

## 2020-12-10 LAB — PROTIME-INR
INR: 1.6 — ABNORMAL HIGH (ref 0.8–1.2)
Prothrombin Time: 19.2 seconds — ABNORMAL HIGH (ref 11.4–15.2)

## 2020-12-10 LAB — GLUCOSE, CAPILLARY
Glucose-Capillary: 111 mg/dL — ABNORMAL HIGH (ref 70–99)
Glucose-Capillary: 121 mg/dL — ABNORMAL HIGH (ref 70–99)
Glucose-Capillary: 88 mg/dL (ref 70–99)
Glucose-Capillary: 99 mg/dL (ref 70–99)

## 2020-12-10 LAB — HEPARIN LEVEL (UNFRACTIONATED): Heparin Unfractionated: 0.23 IU/mL — ABNORMAL LOW (ref 0.30–0.70)

## 2020-12-10 LAB — TYPE AND SCREEN
ABO/RH(D): A POS
Antibody Screen: NEGATIVE
Unit division: 0

## 2020-12-10 MED ORDER — WARFARIN - PHARMACIST DOSING INPATIENT: Freq: Every day | Status: DC

## 2020-12-10 MED ORDER — WARFARIN SODIUM 7.5 MG PO TABS
7.5000 mg | ORAL_TABLET | Freq: Once | ORAL | Status: AC
  Administered 2020-12-10: 7.5 mg via ORAL
  Filled 2020-12-10: qty 1

## 2020-12-10 MED ORDER — BISOPROLOL FUMARATE 5 MG PO TABS
2.5000 mg | ORAL_TABLET | Freq: Every day | ORAL | Status: DC
  Administered 2020-12-11 – 2020-12-18 (×8): 2.5 mg via ORAL
  Filled 2020-12-10 (×8): qty 1

## 2020-12-10 MED ORDER — AMLODIPINE BESYLATE 5 MG PO TABS
5.0000 mg | ORAL_TABLET | Freq: Every day | ORAL | Status: DC
  Administered 2020-12-10 – 2020-12-18 (×9): 5 mg via ORAL
  Filled 2020-12-10 (×9): qty 1

## 2020-12-10 MED ORDER — HEPARIN (PORCINE) 25000 UT/250ML-% IV SOLN
1350.0000 [IU]/h | INTRAVENOUS | Status: DC
  Administered 2020-12-10: 1200 [IU]/h via INTRAVENOUS
  Filled 2020-12-10: qty 250

## 2020-12-10 MED ORDER — BISOPROLOL FUMARATE 5 MG PO TABS: 5.0000 mg | ORAL_TABLET | Freq: Every day | ORAL | Status: DC

## 2020-12-10 NOTE — Progress Notes (Signed)
ANTICOAGULATION CONSULT NOTE - Follow Up Consult  Pharmacy Consult for heparin and warfarin Indication:  mechanical mitral valve  Allergies  Allergen Reactions   Cardizem [Diltiazem]     Bradycardia per pt Doing well on Amlodipine   Sulfonamide Derivatives     Unknown     Patient Measurements: Height: '5\' 10"'$  (177.8 cm) Weight: 80.2 kg (176 lb 12.9 oz) IBW/kg (Calculated) : 73 Heparin Dosing Weight: 80 kg  Vital Signs: Temp: 97.9 F (36.6 C) (09/17 0400) Temp Source: Oral (09/17 0400) BP: 148/72 (09/17 0800) Pulse Rate: 88 (09/17 0800)  Labs: Recent Labs    12/07/20 1151 12/08/20 0358 12/08/20 0358 12/08/20 1351 12/09/20 0244 12/09/20 1651 12/10/20 0434  HGB  --  8.3*   < > 7.5* 6.5* 8.0* 7.5*  HCT  --  23.9*   < > 22.0* 19.4* 23.2* 21.7*  PLT  --  207   < > 185 185  --  163  LABPROT  --  20.3*  --   --  21.4*  --  19.2*  INR  --  1.7*  --   --  1.9*  --  1.6*  CREATININE  --  1.91*  --   --  1.65*  --  1.33*  TROPONINIHS 25*  --   --   --   --   --   --    < > = values in this interval not displayed.    Estimated Creatinine Clearance: 41.9 mL/min (A) (by C-G formula based on SCr of 1.33 mg/dL (H)).  Medications:  Medications Prior to Admission  Medication Sig Dispense Refill Last Dose   allopurinol (ZYLOPRIM) 300 MG tablet TAKE 1 TABLET BY MOUTH DAILY AS NEEDED. GENERIC EQUIVALENT FOR ZYLOPRIM 90 tablet 3 12/02/2020   amLODipine (NORVASC) 5 MG tablet TAKE 1/2 TABLET BY MOUTH EVERY DAY FOR 3 DAYS,IF BLOOD PRESSURE IS STILL HIGHER THAN 130 THEN INCREASE TO 1 TABLET BY MOUTH DAILY (Patient taking differently: Take 5 mg by mouth daily.) 90 tablet 3 12/02/2020   aspirin 81 MG EC tablet Take 81 mg by mouth daily.   12/02/2020   atorvastatin (LIPITOR) 20 MG tablet TAKE 1 TABLET BY MOUTH TWO TIMES A WEEK (Patient taking differently: Take 20 mg by mouth See admin instructions. TAKE 1 TABLET BY MOUTH TWO TIMES A WEEK) 26 tablet 3 12/02/2020   bisoprolol (ZEBETA) 5 MG tablet  Take 0.5 tablets (2.5 mg total) by mouth daily. 45 tablet 3 Past Week   Cholecalciferol (VITAMIN D3) 50 MCG (2000 UT) capsule Take 1 capsule (2,000 Units total) by mouth daily. 100 capsule 3 12/02/2020   dipyridamole (PERSANTINE) 50 MG tablet Take 1 tablet (50 mg total) by mouth 3 (three) times daily. 270 tablet 1 12/02/2020   enalapril (VASOTEC) 20 MG tablet TAKE 1 TABLET BY MOUTH DAILY. 90 tablet 3 12/02/2020   furosemide (LASIX) 40 MG tablet TAKE 1 TABLET BY MOUTH DAILY GENERIC EQUIVALENT FOR LASIX (Patient taking differently: Take 40 mg by mouth in the morning.) 90 tablet 3 12/02/2020   latanoprost (XALATAN) 0.005 % ophthalmic solution Place 1 drop into both eyes at bedtime.   Past Week   magnesium oxide (MAG-OX) 400 MG tablet TAKE 1 TABLET(400 MG) BY MOUTH TWICE DAILY (Patient taking differently: Take 400 mg by mouth 2 (two) times daily.) 60 tablet 7 12/02/2020   Milk Thistle 1000 MG CAPS Take 1 capsule by mouth daily.   Past Week   pantoprazole (PROTONIX) 40 MG tablet TAKE 1 TABLET  BY MOUTH DAILY (Patient taking differently: Take 40 mg by mouth in the morning.) 90 tablet 3 12/02/2020   warfarin (COUMADIN) 5 MG tablet TAKE 1 TABLET BY MOUTH AS DIRECTED BY THE COUMADIN CLINIC (Patient taking differently: Take 5 mg by mouth in the morning. Take 1 tablet by mouth as directed by the coumadin clinic) 100 tablet 1 12/02/2020   Scheduled:   allopurinol  300 mg Oral Daily   amLODipine  5 mg Oral Daily   atorvastatin  20 mg Oral Once per day on Wed Sat   [START ON 12/11/2020] bisoprolol  2.5 mg Oral Daily   feeding supplement  1 Container Oral TID BM   latanoprost  1 drop Both Eyes QHS   Infusions:   ampicillin-sulbactam (UNASYN) IV 3 g (12/10/20 [id number])    Assessment: 87 year old male on warfarin at home for mechanical mitral valve. Patient taking warfarin 5 mg daily at home, held and reversed with vitamin K for appendectomy on 9/12. Patient has experienced blood loss requiring 1 unit of blood and  anticoagulation has been on hold. Surgery and hospitalist have requested warfarin be resumed with heparin bridging. Chart review shows history of cirrhosis, which may impact INR. Hemoglobin, hematocrit, and platelets now stable. Patient taking allopurinol PTA and inpatient. Will begin warfarin and heparin today. Will not bolus heparin per surgery request to avoid bleeding. INR today 1.6.  Goal of Therapy:  Heparin level 0.3-0.7 INR 2.5-3.5 Monitor platelets by anticoagulation protocol: Yes   Plan:  Give warfarin 7.5 mg once today with plans to resume home regimen afterwards Start heparin infusion at 1200 units/hr Check heparin level in 8 hours Check daily INR, heparin level, and CBC  Thank you for allowing pharmacy to participate in this patient's care.  Reatha Harps, PharmD PGY1 Pharmacy Resident 12/10/2020 11:50 AM Check [email or web address] for unit specific pharmacy number

## 2020-12-10 NOTE — Progress Notes (Signed)
[facility] Progress Note  5 Days Post-Op  Subjective: CC-  No acute events.  Denies any abdominal pain or nausea.  Reports that he is having flatus and bowel function.  Has not had any drain output for the last couple days  Objective: Vital signs in last 24 hours: Temp:  [97.9 F (36.6 C)-98.4 F (36.9 C)] 97.9 F (36.6 C) (09/17 0400) Pulse Rate:  [61-91] 88 (09/17 0800) Resp:  [16-20] 20 (09/17 0800) BP: (122-151)/(58-97) 148/72 (09/17 0800) SpO2:  [93 %-98 %] 96 % (09/17 0800) Weight:  [80.2 kg] 80.2 kg (09/17 0400) Last BM Date: 12/07/20 (per PT)  Intake/Output from previous day: 09/16 0701 - 09/17 0700 In: 1712.3 [P.O.:1040; Blood:371.7; IV Piggyback:300.6] Out: 425 [Urine:425] Intake/Output this shift: Total I/O In: 120 [P.O.:120] Out: 250 [Urine:250]  PE: Gen:  Alert, NAD, pleasant Card:  HR 80s Pulm:  rate and effort normal Abd: Soft, nondistended, nontender, incisions cdi with steri strips in place, JP drain with small amount of sanguinous fluid in bulb Psych: A&Ox3  Lab Results:  Recent Labs    12/09/20 0244 12/09/20 1651 12/10/20 0434  WBC 9.2  --  6.9  HGB 6.5* 8.0* 7.5*  HCT 19.4* 23.2* 21.7*  PLT 185  --  163    BMET Recent Labs    12/09/20 0244 12/10/20 0434  NA 134* 133*  K 4.1 3.9  CL 104 101  CO2 24 21*  GLUCOSE 108* 90  BUN 24* 21  CREATININE 1.65* 1.33*  CALCIUM 8.1* 8.2*    PT/INR Recent Labs    12/09/20 0244 12/10/20 0434  LABPROT 21.4* 19.2*  INR 1.9* 1.6*    CMP     Component Value Date/Time   NA 133 (L) 12/10/2020 0434   K 3.9 12/10/2020 0434   CL 101 12/10/2020 0434   CO2 21 (L) 12/10/2020 0434   GLUCOSE 90 12/10/2020 0434   BUN 21 12/10/2020 0434   CREATININE 1.33 (H) 12/10/2020 0434   CREATININE 1.40 (H) 04/01/2015 1540   CALCIUM 8.2 (L) 12/10/2020 0434   PROT 5.6 (L) 12/07/2020 1001   ALBUMIN 2.9 (L) 12/07/2020 1001   AST 31 12/07/2020 1001   ALT 21 12/07/2020 1001   ALKPHOS 61  12/07/2020 1001   BILITOT 1.7 (H) 12/07/2020 1001   GFRNONAA 52 (L) 12/10/2020 0434   GFRAA 107 11/17/2007 1231   Lipase     Component Value Date/Time   LIPASE 15.0 12/18/2017 0828       Studies/Results: No results found.  Anti-infectives: Anti-infectives (From admission, onward)    Start     Dose/Rate Route Frequency Ordered Stop   12/09/20 1330  vancomycin (VANCOREADY) IVPB 1500 mg/300 mL  Status:  Discontinued        1,500 mg 150 mL/hr over 120 Minutes Intravenous Every 48 hours 12/07/20 1531 12/08/20 0835   12/09/20 0900  Ampicillin-Sulbactam (UNASYN) 3 g in sodium chloride 0.9 % 100 mL IVPB        3 g 200 mL/hr over 30 Minutes Intravenous Every 8 hours 12/09/20 0836     12/08/20 1100  vancomycin (VANCOCIN) IVPB 1000 mg/200 mL premix  Status:  Discontinued        1,000 mg 200 mL/hr over 60 Minutes Intravenous Every 24 hours 12/07/20 1033 12/07/20 1531   12/07/20 1900  Ampicillin-Sulbactam (UNASYN) 3 g in sodium chloride 0.9 % 100 mL IVPB  Status:  Discontinued        3 g 200 mL/hr over 30  Minutes Intravenous Every 12 hours 12/07/20 1542 12/09/20 0836   12/07/20 1130  vancomycin (VANCOREADY) IVPB 1500 mg/300 mL        1,500 mg 150 mL/hr over 120 Minutes Intravenous  Once 12/07/20 1031 12/07/20 1618   12/03/20 0200  piperacillin-tazobactam (ZOSYN) IVPB 3.375 g  Status:  Discontinued        3.375 g 12.5 mL/hr over 240 Minutes Intravenous Every 8 hours 12/03/20 0121 12/07/20 1542   12/03/20 0100  piperacillin-tazobactam (ZOSYN) IVPB 3.375 g  Status:  Discontinued        3.375 g 100 mL/hr over 30 Minutes Intravenous  Once 12/03/20 0047 12/03/20 0121        Assessment/Plan POD# 5  Laparoscopic appendectomy for Acute appendicitis 9/12 Dr. Barry Dienes  - appendiceal orifice was sutured with a pursestring 2-0 vicryl using an Endostitch, drain was placed adjacent to the cecum - likely bled from this suture line but does not appear to be bleeding any more. Vital signs are  stable. JP drain output has decreased.  - continue JP drain and monitor output - plan to continue IV antibiotics while admitted. Will not need antibiotics at discharge -Given blood transfusion yesterday, would be hesitant to resume heparin drip but could consider doing that at a low rate without bolus   ID - zosyn 9/10>>9/14, unasyn 9/14>>, vancomycin 9/14>>9/15 FEN - IVF, heart healthy diet VTE - IV heparin held due to bleeding Foley - none   Mechanical mitral valve replacement on coumadin - continue IV heparin Permanent A Fib HTN CAD AKI on CKD IIIa - Cr 1.91 > 1.65, improving on IVF CHF Cirrhosis   LOS: 7 days    Clovis Riley, [facility] 12/10/2020, 8:42 AM Please see Amion for pager number during day hours 7:00am-4:30pm

## 2020-12-10 NOTE — Progress Notes (Signed)
PROGRESS NOTE    Anthony Gould   [id number]  DOB: 08-17-1933  DOA: 12/02/2020 PCP: Cassandria Anger, MD   Brief Narrative:  Anthony Gould is an 87 year old male with paroxysmal atrial fibrillation, mechanical mitral valve on Coumadin, CKD stage III AAA, hypertension, pulmonary hypertension, cirrhotic changes of the liver noted on imaging with a former history of alcohol use, diabetes mellitus, history of TIA who has been hospitalized for acute appendicitis.  The patient's Coumadin was discontinued, INR was reversed and he underwent appendectomy on 9/12.  Subsequent to this he has had some ongoing mild blood loss and anticoagulation has been on hold.   Subjective: Patient states today, that he has not noticed any increased blood in his drain.  His appetite remains slightly poor.  He had a bowel movement that was normal.  He is urinating well.     Assessment & Plan:   Principal Problem:   Acute appendicitis -Status post appendectomy on 9/12 - He is on Unasyn with a plan to continue this for 7 days or until discharge  Active Problems: Acute blood loss anemia-postop - Hemoglobin drifted down to 6.5 and he was transfused 1 units of packed red blood cells - Heparin has been on hold-this will be resumed today and we will monitor on heparin for 24 to 48 hours to ensure that he does not bleed -Aspirin 81 mg continues to be on hold  Paroxysmal atrial fibrillation, mechanical mitral valve - See above in regards to heparin - Continue bisoprolol per home dose (2.5 mg)  Gout - Continue allopurinol  CKD stage IIIa - Baseline creatinine appears to range from 1.2 to 1.4 - Creatinine rose to 1.9 but has subsequently improved   Hypertension with hypertensive heart disease - Continue bisoprolol  - Amlodipine and lisinopril are still on hold -BP noted to be elevated-we will resume amlodipine (5 mg) -Lasix being held while we are carefully watching his fluid balance-oral intake  remains poor and therefore I will hold Lasix again today    DM2 (?) -He is not on any medication for this at home and prior A1c's have been within the normal range     Time spent in minutes: 35 DVT prophylaxis: Place and maintain sequential compression device Start: 12/06/20 1024 Code Status: Full code Family Communication:  Level of Care: Level of care: Progressive Disposition Plan:  Status is: Inpatient  Remains inpatient appropriate because:IV treatments appropriate due to intensity of illness or inability to take PO and Inpatient level of care appropriate due to severity of illness  Dispo: The patient is from: Home              Anticipated d/c is to: Home              Patient currently is not medically stable to d/c.   Difficult to place patient No      Consultants:  General surgery Procedures:  Laparoscopic appendectomy 9/12 Antimicrobials:  Anti-infectives (From admission, onward)    Start     Dose/Rate Route Frequency Ordered Stop   12/09/20 1330  vancomycin (VANCOREADY) IVPB 1500 mg/300 mL  Status:  Discontinued        1,500 mg 150 mL/hr over 120 Minutes Intravenous Every 48 hours 12/07/20 1531 12/08/20 0835   12/09/20 0900  Ampicillin-Sulbactam (UNASYN) 3 g in sodium chloride 0.9 % 100 mL IVPB        3 g 200 mL/hr over 30 Minutes Intravenous Every 8 hours 12/09/20 0836  12/08/20 1100  vancomycin (VANCOCIN) IVPB 1000 mg/200 mL premix  Status:  Discontinued        1,000 mg 200 mL/hr over 60 Minutes Intravenous Every 24 hours 12/07/20 1033 12/07/20 1531   12/07/20 1900  Ampicillin-Sulbactam (UNASYN) 3 g in sodium chloride 0.9 % 100 mL IVPB  Status:  Discontinued        3 g 200 mL/hr over 30 Minutes Intravenous Every 12 hours 12/07/20 1542 12/09/20 0836   12/07/20 1130  vancomycin (VANCOREADY) IVPB 1500 mg/300 mL        1,500 mg 150 mL/hr over 120 Minutes Intravenous  Once 12/07/20 1031 12/07/20 1618   12/03/20 0200  piperacillin-tazobactam (ZOSYN) IVPB 3.375 g   Status:  Discontinued        3.375 g 12.5 mL/hr over 240 Minutes Intravenous Every 8 hours 12/03/20 0121 12/07/20 1542   12/03/20 0100  piperacillin-tazobactam (ZOSYN) IVPB 3.375 g  Status:  Discontinued        3.375 g 100 mL/hr over 30 Minutes Intravenous  Once 12/03/20 0047 12/03/20 0121        Objective: Vitals:   12/09/20 2307 12/10/20 0400 12/10/20 0700 12/10/20 0800  BP: (!) 124/97 (!) 138/58 139/68 (!) 148/72  Pulse: 76 61 77 88  Resp: '16 17  20  '$ Temp: 98.1 F (36.7 C) 97.9 F (36.6 C)    TempSrc: Oral Oral    SpO2: 96% 97% 94% 96%  Weight:  80.2 kg    Height:        Intake/Output Summary (Last 24 hours) at 12/10/2020 1119 Last data filed at 12/10/2020 0820 Gross per 24 hour  Intake 1612.27 ml  Output 675 ml  Net 937.27 ml   Filed Weights   12/08/20 0242 12/09/20 0000 12/10/20 0400  Weight: 76.9 kg 79.2 kg 80.2 kg    Examination: General exam: Appears comfortable  HEENT: PERRLA, oral mucosa moist, no sclera icterus or thrush Respiratory system: Clear to auscultation. Respiratory effort normal. Cardiovascular system: S1 & S2 heard, RRR.  Audible metallic click Gastrointestinal system: Abdomen soft, non-tender, nondistended. Normal bowel sounds.  Abdominal incisions appear clean-drain present  in the left lower quadrant with a small amount of blood in the bulb Central nervous system: Alert and oriented. No focal neurological deficits. Extremities: No cyanosis, clubbing or edema Skin: No rashes or ulcers Psychiatry:  Mood & affect appropriate.     Data Reviewed: I have personally reviewed following labs and imaging studies  CBC: Recent Labs  Lab 12/07/20 0149 12/07/20 1001 12/08/20 0358 12/08/20 1351 12/09/20 0244 12/09/20 1651 12/10/20 0434  WBC 17.2* 12.7* 10.1 11.3* 9.2  --  6.9  NEUTROABS 13.3* 10.0* 7.8*  --  6.6  --  4.4  HGB 12.6* 10.8* 8.3* 7.5* 6.5* 8.0* 7.5*  HCT 37.2* 31.4* 23.9* 22.0* 19.4* 23.2* 21.7*  MCV 97.9 97.2 98.8 98.2 99.0  --   96.0  PLT 303 257 207 185 185  --  [id number]   Basic Metabolic Panel: Recent Labs  Lab 12/06/20 0544 12/06/20 0554 12/07/20 0149 12/07/20 1001 12/08/20 0358 12/09/20 0244 12/10/20 0434  NA 137  --   --  133* 133* 134* 133*  K 3.0*  --  3.4* 3.8 5.1 4.1 3.9  CL 99  --   --  96* 100 104 101  CO2 28  --   --  '25 24 24 '$ 21*  GLUCOSE 202*  --   --  135* 151* 108* 90  BUN 12  --   --  17 21 24* 21  CREATININE 1.23  --   --  1.85* 1.91* 1.65* 1.33*  CALCIUM 8.3*  --   --  8.5* 8.4* 8.1* 8.2*  MG  --  1.3* 1.3* 1.1*  --  2.0  --   PHOS  --   --   --   --   --  2.9  --    GFR: Estimated Creatinine Clearance: 41.9 mL/min (A) (by C-G formula based on SCr of 1.33 mg/dL (H)). Liver Function Tests: Recent Labs  Lab 12/07/20 1001  AST 31  ALT 21  ALKPHOS 61  BILITOT 1.7*  PROT 5.6*  ALBUMIN 2.9*   No results for input(s): LIPASE, AMYLASE in the last 168 hours. No results for input(s): AMMONIA in the last 168 hours. Coagulation Profile: Recent Labs  Lab 12/06/20 0544 12/07/20 0149 12/08/20 0358 12/09/20 0244 12/10/20 0434  INR 1.4* 1.6* 1.7* 1.9* 1.6*   Cardiac Enzymes: No results for input(s): CKTOTAL, CKMB, CKMBINDEX, TROPONINI in the last 168 hours. BNP (last 3 results) No results for input(s): PROBNP in the last 8760 hours. HbA1C: No results for input(s): HGBA1C in the last 72 hours. CBG: Recent Labs  Lab 12/09/20 0546 12/09/20 1212 12/09/20 1757 12/09/20 2306 12/10/20 0535  GLUCAP 107* 130* 117* 105* 88   Lipid Profile: No results for input(s): CHOL, HDL, LDLCALC, TRIG, CHOLHDL, LDLDIRECT in the last 72 hours. Thyroid Function Tests: No results for input(s): TSH, T4TOTAL, FREET4, T3FREE, THYROIDAB in the last 72 hours. Anemia Panel: No results for input(s): VITAMINB12, FOLATE, FERRITIN, TIBC, IRON, RETICCTPCT in the last 72 hours. Urine analysis:    Component Value Date/Time   COLORURINE YELLOW 12/02/2020 [place] 12/02/2020 1143   LABSPEC  1.015 12/02/2020 1143   PHURINE 5.0 12/02/2020 1143   GLUCOSEU NEGATIVE 12/02/2020 1143   HGBUR NEGATIVE 12/02/2020 1143   BILIRUBINUR NEGATIVE 12/02/2020 1143   KETONESUR NEGATIVE 12/02/2020 1143   PROTEINUR 30 (A) 08/07/2013 1633   UROBILINOGEN 0.2 12/02/2020 1143   NITRITE NEGATIVE 12/02/2020 1143   LEUKOCYTESUR NEGATIVE 12/02/2020 1143   Sepsis Labs: '@LABRCNTIP'$ (procalcitonin:4,lacticidven:4) ) Recent Results (from the past 240 hour(s))  Resp Panel by RT-PCR (Flu A&B, Covid) Nasopharyngeal Swab     Status: None   Collection Time: 12/02/20  7:15 PM   Specimen: Nasopharyngeal Swab; Nasopharyngeal(NP) swabs in vial transport medium  Result Value Ref Range Status   SARS Coronavirus 2 by RT PCR NEGATIVE NEGATIVE Final    Comment: (NOTE) SARS-CoV-2 target nucleic acids are NOT DETECTED.  The SARS-CoV-2 RNA is generally detectable in upper respiratory specimens during the acute phase of infection. The lowest concentration of SARS-CoV-2 viral copies this assay can detect is 138 copies/mL. A negative result does not preclude SARS-Cov-2 infection and should not be used as the sole basis for treatment or other patient management decisions. A negative result may occur with  improper specimen collection/handling, submission of specimen other than nasopharyngeal swab, presence of viral mutation(s) within the areas targeted by this assay, and inadequate number of viral copies(<138 copies/mL). A negative result must be combined with clinical observations, patient history, and epidemiological information. The expected result is Negative.  Fact Sheet for Patients:  EntrepreneurPulse.com.au  Fact Sheet for Healthcare Providers:  IncredibleEmployment.be  This test is no t yet approved or cleared by the [place] FDA and  has been authorized for detection and/or diagnosis of SARS-CoV-2 by FDA under an Emergency Use Authorization (EUA). This EUA will  remain  in effect (  meaning this test can be used) for the duration of the COVID-19 declaration under Section 564(b)(1) of the Act, 21 U.S.C.section 360bbb-3(b)(1), unless the authorization is terminated  or revoked sooner.       Influenza A by PCR NEGATIVE NEGATIVE Final   Influenza B by PCR NEGATIVE NEGATIVE Final    Comment: (NOTE) The Xpert Xpress SARS-CoV-2/FLU/RSV plus assay is intended as an aid in the diagnosis of influenza from Nasopharyngeal swab specimens and should not be used as a sole basis for treatment. Nasal washings and aspirates are unacceptable for Xpert Xpress SARS-CoV-2/FLU/RSV testing.  Fact Sheet for Patients: EntrepreneurPulse.com.au  Fact Sheet for Healthcare Providers: IncredibleEmployment.be  This test is not yet approved or cleared by the [place] FDA and has been authorized for detection and/or diagnosis of SARS-CoV-2 by FDA under an Emergency Use Authorization (EUA). This EUA will remain in effect (meaning this test can be used) for the duration of the COVID-19 declaration under Section 564(b)(1) of the Act, 21 U.S.C. section 360bbb-3(b)(1), unless the authorization is terminated or revoked.  Performed at [facility], [place] [address], [place], [place] [address]   Surgical pcr screen     Status: None   Collection Time: 12/04/20  6:10 PM   Specimen: Nasal Mucosa; Nasal Swab  Result Value Ref Range Status   MRSA, PCR NEGATIVE NEGATIVE Final   Staphylococcus aureus NEGATIVE NEGATIVE Final    Comment: (NOTE) The Xpert SA Assay (FDA approved for NASAL specimens in patients 85 years of age and older), is one component of a comprehensive surveillance program. It is not intended to diagnose infection nor to guide or monitor treatment. Performed at [facility], [place] [address] [place], [place] [address]   Urine Culture     Status: None   Collection Time: 12/07/20  9:44 AM   Specimen: Urine,  Catheterized  Result Value Ref Range Status   Specimen Description URINE, CATHETERIZED  Final   Special Requests NONE  Final   Culture   Final    NO GROWTH Performed at [facility] [address], [place], [place] [address]    Report Status 12/08/2020 FINAL  Final  Culture, blood (x 2)     Status: None (Preliminary result)   Collection Time: 12/07/20 10:00 AM   Specimen: BLOOD  Result Value Ref Range Status   Specimen Description BLOOD RIGHT ANTECUBITAL  Final   Special Requests   Final    BOTTLES DRAWN AEROBIC AND ANAEROBIC Blood Culture adequate volume   Culture   Final    NO GROWTH 3 DAYS Performed at [facility], [place] [address], [place], [place] [address]    Report Status PENDING  Incomplete  Culture, blood (x 2)     Status: None (Preliminary result)   Collection Time: 12/07/20 10:20 AM   Specimen: BLOOD  Result Value Ref Range Status   Specimen Description BLOOD RIGHT ANTECUBITAL  Final   Special Requests   Final    BOTTLES DRAWN AEROBIC AND ANAEROBIC Blood Culture adequate volume   Culture   Final    NO GROWTH 3 DAYS Performed at [facility], [place] [address], [place], [place] [address]    Report Status PENDING  Incomplete         Radiology Studies: No results found.    Scheduled Meds:  allopurinol  300 mg Oral Daily   atorvastatin  20 mg Oral Once per day on Wed Sat   bisoprolol  2.5 mg Oral Daily   feeding supplement  1 Container Oral TID BM   latanoprost  1 drop Both Eyes QHS   Continuous Infusions:  ampicillin-sulbactam (UNASYN) IV 3 g (12/10/20 0851)     LOS: 7 days      Debbe Odea, MD [facility] Pager: [email or web address] 12/10/2020, 11:19 AM

## 2020-12-10 NOTE — Progress Notes (Signed)
ANTICOAGULATION CONSULT NOTE - Follow Up Consult  Pharmacy Consult for heparin and warfarin Indication:  mechanical mitral valve  Allergies  Allergen Reactions   Cardizem [Diltiazem]     Bradycardia per pt Doing well on Amlodipine   Sulfonamide Derivatives     Unknown     Patient Measurements: Height: '5\' 10"'$  (177.8 cm) Weight: 80.2 kg (176 lb 12.9 oz) IBW/kg (Calculated) : 73 Heparin Dosing Weight: 80 kg  Vital Signs: Temp: 98.9 F (37.2 C) (09/17 1935) Temp Source: Oral (09/17 1935) BP: 137/63 (09/17 1935) Pulse Rate: 80 (09/17 1935)  Labs: Recent Labs    12/08/20 0358 12/08/20 1351 12/09/20 0244 12/09/20 1651 12/10/20 0434 12/10/20 2108  HGB 8.3* 7.5* 6.5* 8.0* 7.5*  --   HCT 23.9* 22.0* 19.4* 23.2* 21.7*  --   PLT 207 185 185  --  163  --   LABPROT 20.3*  --  21.4*  --  19.2*  --   INR 1.7*  --  1.9*  --  1.6*  --   HEPARINUNFRC  --   --   --   --   --  0.23*  CREATININE 1.91*  --  1.65*  --  1.33*  --      Estimated Creatinine Clearance: 41.9 mL/min (A) (by C-G formula based on SCr of 1.33 mg/dL (H)).  Medications:  Medications Prior to Admission  Medication Sig Dispense Refill Last Dose   allopurinol (ZYLOPRIM) 300 MG tablet TAKE 1 TABLET BY MOUTH DAILY AS NEEDED. GENERIC EQUIVALENT FOR ZYLOPRIM 90 tablet 3 12/02/2020   amLODipine (NORVASC) 5 MG tablet TAKE 1/2 TABLET BY MOUTH EVERY DAY FOR 3 DAYS,IF BLOOD PRESSURE IS STILL HIGHER THAN 130 THEN INCREASE TO 1 TABLET BY MOUTH DAILY (Patient taking differently: Take 5 mg by mouth daily.) 90 tablet 3 12/02/2020   aspirin 81 MG EC tablet Take 81 mg by mouth daily.   12/02/2020   atorvastatin (LIPITOR) 20 MG tablet TAKE 1 TABLET BY MOUTH TWO TIMES A WEEK (Patient taking differently: Take 20 mg by mouth See admin instructions. TAKE 1 TABLET BY MOUTH TWO TIMES A WEEK) 26 tablet 3 12/02/2020   bisoprolol (ZEBETA) 5 MG tablet Take 0.5 tablets (2.5 mg total) by mouth daily. 45 tablet 3 Past Week   Cholecalciferol (VITAMIN  D3) 50 MCG (2000 UT) capsule Take 1 capsule (2,000 Units total) by mouth daily. 100 capsule 3 12/02/2020   dipyridamole (PERSANTINE) 50 MG tablet Take 1 tablet (50 mg total) by mouth 3 (three) times daily. 270 tablet 1 12/02/2020   enalapril (VASOTEC) 20 MG tablet TAKE 1 TABLET BY MOUTH DAILY. 90 tablet 3 12/02/2020   furosemide (LASIX) 40 MG tablet TAKE 1 TABLET BY MOUTH DAILY GENERIC EQUIVALENT FOR LASIX (Patient taking differently: Take 40 mg by mouth in the morning.) 90 tablet 3 12/02/2020   latanoprost (XALATAN) 0.005 % ophthalmic solution Place 1 drop into both eyes at bedtime.   Past Week   magnesium oxide (MAG-OX) 400 MG tablet TAKE 1 TABLET(400 MG) BY MOUTH TWICE DAILY (Patient taking differently: Take 400 mg by mouth 2 (two) times daily.) 60 tablet 7 12/02/2020   Milk Thistle 1000 MG CAPS Take 1 capsule by mouth daily.   Past Week   pantoprazole (PROTONIX) 40 MG tablet TAKE 1 TABLET BY MOUTH DAILY (Patient taking differently: Take 40 mg by mouth in the morning.) 90 tablet 3 12/02/2020   warfarin (COUMADIN) 5 MG tablet TAKE 1 TABLET BY MOUTH AS DIRECTED BY THE COUMADIN CLINIC (  Patient taking differently: Take 5 mg by mouth in the morning. Take 1 tablet by mouth as directed by the coumadin clinic) 100 tablet 1 12/02/2020   Scheduled:   allopurinol  300 mg Oral Daily   amLODipine  5 mg Oral Daily   atorvastatin  20 mg Oral Once per day on Wed Sat   [START ON 12/11/2020] bisoprolol  2.5 mg Oral Daily   feeding supplement  1 Container Oral TID BM   latanoprost  1 drop Both Eyes QHS   Warfarin - Pharmacist Dosing Inpatient   Does not apply q1600   Infusions:   ampicillin-sulbactam (UNASYN) IV Stopped (12/10/20 1650)   heparin 1,200 Units/hr (12/10/20 1251)    Assessment: 87 year old male on warfarin at home for mechanical mitral valve. Patient taking warfarin 5 mg daily at home, held and reversed with vitamin K for appendectomy on 9/12. Patient has experienced blood loss requiring 1 unit of blood and  anticoagulation has been on hold. Surgery and hospitalist have requested warfarin be resumed with heparin bridging. Chart review shows history of cirrhosis, which may impact INR. Hemoglobin, hematocrit, and platelets now stable. Patient taking allopurinol PTA and inpatient. Per surgery, NO heparin boluses   Initial heparin level is slightly subtherapeutic at 0.23 on 1200 units/hr. Per RN, no overt s/s of bleeding noted.   Goal of Therapy:  Heparin level 0.3-0.7 INR 2.5-3.5 Monitor platelets by anticoagulation protocol: Yes   Plan:  Increase heparin infusion to 1350 units/hr Check heparin level in 8 hours Check daily INR, heparin level, and CBC  Thank you for allowing pharmacy to participate in this patient's care.  Albertina Parr, PharmD., BCPS, BCCCP Clinical Pharmacist Please refer to [facility] for unit-specific pharmacist

## 2020-12-11 DIAGNOSIS — I4821 Permanent atrial fibrillation: Secondary | ICD-10-CM | POA: Diagnosis not present

## 2020-12-11 DIAGNOSIS — K3580 Unspecified acute appendicitis: Secondary | ICD-10-CM | POA: Diagnosis not present

## 2020-12-11 DIAGNOSIS — K353 Acute appendicitis with localized peritonitis, without perforation or gangrene: Secondary | ICD-10-CM | POA: Diagnosis not present

## 2020-12-11 DIAGNOSIS — I5032 Chronic diastolic (congestive) heart failure: Secondary | ICD-10-CM | POA: Diagnosis not present

## 2020-12-11 LAB — GLUCOSE, CAPILLARY
Glucose-Capillary: 93 mg/dL (ref 70–99)
Glucose-Capillary: 120 mg/dL — ABNORMAL HIGH (ref 70–99)

## 2020-12-11 LAB — CBC WITH DIFFERENTIAL/PLATELET
Abs Immature Granulocytes: 0 10*3/uL (ref 0.00–0.07)
Basophils Absolute: 0.1 10*3/uL (ref 0.0–0.1)
Basophils Relative: 1 %
Eosinophils Absolute: 0.3 10*3/uL (ref 0.0–0.5)
Eosinophils Relative: 5 %
HCT: 22.8 % — ABNORMAL LOW (ref 39.0–52.0)
Hemoglobin: 7.6 g/dL — ABNORMAL LOW (ref 13.0–17.0)
Lymphocytes Relative: 8 %
Lymphs Abs: 0.5 10*3/uL — ABNORMAL LOW (ref 0.7–4.0)
MCH: 32.8 pg (ref 26.0–34.0)
MCHC: 33.3 g/dL (ref 30.0–36.0)
MCV: 98.3 fL (ref 80.0–100.0)
Monocytes Absolute: 0.3 10*3/uL (ref 0.1–1.0)
Monocytes Relative: 4 %
Neutro Abs: 5.2 10*3/uL (ref 1.7–7.7)
Neutrophils Relative %: 82 %
Platelets: 191 10*3/uL (ref 150–400)
RBC: 2.32 MIL/uL — ABNORMAL LOW (ref 4.22–5.81)
RDW: 15.2 % (ref 11.5–15.5)
WBC: 6.4 10*3/uL (ref 4.0–10.5)
nRBC: 0.6 % — ABNORMAL HIGH (ref 0.0–0.2)
nRBC: 3 /100 WBC — ABNORMAL HIGH

## 2020-12-11 LAB — HEMOGLOBIN AND HEMATOCRIT, BLOOD
HCT: 27.8 % — ABNORMAL LOW (ref 39.0–52.0)
Hemoglobin: 9.5 g/dL — ABNORMAL LOW (ref 13.0–17.0)

## 2020-12-11 LAB — HEPARIN LEVEL (UNFRACTIONATED): Heparin Unfractionated: <0.1 IU/mL — ABNORMAL LOW (ref 0.30–0.70)

## 2020-12-11 LAB — PROTIME-INR
INR: 1.5 — ABNORMAL HIGH (ref 0.8–1.2)
Prothrombin Time: 18.5 seconds — ABNORMAL HIGH (ref 11.4–15.2)

## 2020-12-11 LAB — PREPARE RBC (CROSSMATCH)

## 2020-12-11 MED ORDER — SODIUM CHLORIDE 0.9% IV SOLUTION: Freq: Once | INTRAVENOUS | Status: AC

## 2020-12-11 NOTE — Progress Notes (Signed)
JP drain found to contain 75cc of dark red blood. No drainage in last 2 days. On call MD notified.

## 2020-12-11 NOTE — Progress Notes (Signed)
[facility] Progress Note  6 Days Post-Op  Subjective: CC-  Heparin was resumed yesterday and subsequently his drain output picked up and was bloody appearing so heparin has again been paused.  He did get 1 dose of warfarin yesterday.  He denies any abdominal pain or bloating.  Objective: Vital signs in last 24 hours: Temp:  [97.4 F (36.3 C)-98.9 F (37.2 C)] 98 F (36.7 C) (09/18 0712) Pulse Rate:  [75-80] 76 (09/18 0853) Resp:  [15-20] 18 (09/18 0712) BP: (131-143)/(61-83) 131/61 (09/18 0853) SpO2:  [95 %-99 %] 97 % (09/18 0712) Weight:  [80.3 kg] 80.3 kg (09/18 0006) Last BM Date: 12/10/20  Intake/Output from previous day: 09/17 0701 - 09/18 0700 In: 629.6 [P.O.:480; I.V.:49.6; IV Piggyback:100] Out: 1050 [Urine:975; Drains:75] Intake/Output this shift: Total I/O In: 240 [P.O.:240] Out: 125 [Drains:125]  PE: Gen:  Alert, NAD, pleasant Card:  HR 80s Pulm:  rate and effort normal Abd: Soft, nondistended, nontender, incisions cdi with steri strips in place, JP drain with sanguinous fluid in bulb Psych: A&Ox3  Lab Results:  Recent Labs    12/10/20 0434 12/11/20 0605  WBC 6.9 6.4  HGB 7.5* 7.6*  HCT 21.7* 22.8*  PLT 163 191    BMET Recent Labs    12/09/20 0244 12/10/20 0434  NA 134* 133*  K 4.1 3.9  CL 104 101  CO2 24 21*  GLUCOSE 108* 90  BUN 24* 21  CREATININE 1.65* 1.33*  CALCIUM 8.1* 8.2*    PT/INR Recent Labs    12/10/20 0434 12/11/20 0605  LABPROT 19.2* 18.5*  INR 1.6* 1.5*    CMP     Component Value Date/Time   NA 133 (L) 12/10/2020 0434   K 3.9 12/10/2020 0434   CL 101 12/10/2020 0434   CO2 21 (L) 12/10/2020 0434   GLUCOSE 90 12/10/2020 0434   BUN 21 12/10/2020 0434   CREATININE 1.33 (H) 12/10/2020 0434   CREATININE 1.40 (H) 04/01/2015 1540   CALCIUM 8.2 (L) 12/10/2020 0434   PROT 5.6 (L) 12/07/2020 1001   ALBUMIN 2.9 (L) 12/07/2020 1001   AST 31 12/07/2020 1001   ALT 21 12/07/2020 1001   ALKPHOS 61 12/07/2020  1001   BILITOT 1.7 (H) 12/07/2020 1001   GFRNONAA 52 (L) 12/10/2020 0434   GFRAA 107 11/17/2007 1231   Lipase     Component Value Date/Time   LIPASE 15.0 12/18/2017 0828       Studies/Results: No results found.  Anti-infectives: Anti-infectives (From admission, onward)    Start     Dose/Rate Route Frequency Ordered Stop   12/09/20 1330  vancomycin (VANCOREADY) IVPB 1500 mg/300 mL  Status:  Discontinued        1,500 mg 150 mL/hr over 120 Minutes Intravenous Every 48 hours 12/07/20 1531 12/08/20 0835   12/09/20 0900  Ampicillin-Sulbactam (UNASYN) 3 g in sodium chloride 0.9 % 100 mL IVPB        3 g 200 mL/hr over 30 Minutes Intravenous Every 8 hours 12/09/20 0836     12/08/20 1100  vancomycin (VANCOCIN) IVPB 1000 mg/200 mL premix  Status:  Discontinued        1,000 mg 200 mL/hr over 60 Minutes Intravenous Every 24 hours 12/07/20 1033 12/07/20 1531   12/07/20 1900  Ampicillin-Sulbactam (UNASYN) 3 g in sodium chloride 0.9 % 100 mL IVPB  Status:  Discontinued        3 g 200 mL/hr over 30 Minutes Intravenous Every 12 hours 12/07/20 1542 12/09/20  [id number]   12/07/20 1130  vancomycin (VANCOREADY) IVPB 1500 mg/300 mL        1,500 mg 150 mL/hr over 120 Minutes Intravenous  Once 12/07/20 1031 12/07/20 1618   12/03/20 0200  piperacillin-tazobactam (ZOSYN) IVPB 3.375 g  Status:  Discontinued        3.375 g 12.5 mL/hr over 240 Minutes Intravenous Every 8 hours 12/03/20 0121 12/07/20 1542   12/03/20 0100  piperacillin-tazobactam (ZOSYN) IVPB 3.375 g  Status:  Discontinued        3.375 g 100 mL/hr over 30 Minutes Intravenous  Once 12/03/20 0047 12/03/20 0121        Assessment/Plan POD# 5  Laparoscopic appendectomy for Acute appendicitis 9/12 Dr. Barry Dienes  - appendiceal orifice was sutured with a pursestring 2-0 vicryl using an Endostitch, drain was placed adjacent to the cecum -No tachycardia and hemoglobin is stable, but increased drain output with heparin resumption.  Difficult  situation, may just need a few more days off anticoagulation but will continue to follow. - continue JP drain and monitor output - plan to continue IV antibiotics while admitted. Will not need antibiotics at discharge    ID - zosyn 9/10>>9/14, unasyn 9/14>>, vancomycin 9/14>>9/15 FEN - IVF, heart healthy diet VTE - IV heparin held due to bleeding Foley - none   Mechanical mitral valve replacement on coumadin - continue IV heparin Permanent A Fib HTN CAD AKI on CKD IIIa - Cr 1.91 > 1.65, improving on IVF CHF Cirrhosis   LOS: 8 days    Clovis Riley, [facility] 12/11/2020, 9:16 AM Please see Amion for pager number during day hours 7:00am-4:30pm

## 2020-12-11 NOTE — Progress Notes (Signed)
PROGRESS NOTE    Anthony Gould   [id number]  DOB: [birth year]  DOA: 12/02/2020 PCP: Cassandria Anger, MD   Brief Narrative:  Anthony Gould is an 87 year old male with paroxysmal atrial fibrillation, mechanical mitral valve on Coumadin, CKD stage III AAA, hypertension, pulmonary hypertension, cirrhotic changes of the liver noted on imaging with a former history of alcohol use, diabetes mellitus, history of TIA who has been hospitalized for acute appendicitis.  The patient's Coumadin was discontinued, INR was reversed and he underwent appendectomy on 9/12.  Subsequent to this he has had some ongoing mild blood loss and anticoagulation has been on hold.   Subjective: Recurrent bleeding after starting Heparin. He is not having any pain. No other complaints.   Assessment & Plan:   Principal Problem:   Acute appendicitis -Status post appendectomy on 9/12 - He is on Unasyn with a plan to continue this for 7 days or until discharge  Active Problems: Acute blood loss anemia-postop bleed - Hemoglobin drifted down to 6.5 and he was transfused 1 units of packed red blood cells - bleeding had resolved  -Aspirin 81 mg continues to be on hold - Heparin started post up, noted to bleed and then held on 9/14- resumed 9/17 with out a bolus   - Heparin level was 0.23 at 2108 and subsequently < 0.10 at 605 - bleeding recurred last night and Heparin stopped - Hgb 6.5> transfused>  8.0> 7.5> 7.6 - Hgb has not dropped since yesterday but due to bleeding, will give 1 U PRBC today   Paroxysmal atrial fibrillation, mechanical mitral valve - See above in regards to heparin - Continue bisoprolol per home dose (2.5 mg) - high risk for thrombosis at this point as he has been without proper anticoagulation since surgery  Gout - Continue allopurinol  AKI on CKD stage IIIa - Baseline creatinine appears to range from 1.2 to 1.4 - Creatinine rose to 1.9 but has subsequently improved    Elevated troponin  Hypertension with hypertensive heart disease - Continue bisoprolol  - Amlodipine and lisinopril are still on hold -BP noted to be elevated-we will resume amlodipine (5 mg) -Lasix being held while we are carefully watching his fluid balance-oral intake remains poor and therefore I will hold Lasix again today    DM2 (?) -He is not on any medication for this at home and prior A1c's have been within the normal range     Time spent in minutes: 35 DVT prophylaxis: Place and maintain sequential compression device Start: 12/06/20 1024 Code Status: Full code Family Communication:  Level of Care: Level of care: Progressive Disposition Plan:  Status is: Inpatient  Remains inpatient appropriate because:IV treatments appropriate due to intensity of illness or inability to take PO and Inpatient level of care appropriate due to severity of illness  Dispo: The patient is from: Home              Anticipated d/c is to: Home              Patient currently is not medically stable to d/c.   Difficult to place patient No      Consultants:  General surgery Cardiology  Procedures:  Laparoscopic appendectomy 9/12 Antimicrobials:  Anti-infectives (From admission, onward)    Start     Dose/Rate Route Frequency Ordered Stop   12/09/20 1330  vancomycin (VANCOREADY) IVPB 1500 mg/300 mL  Status:  Discontinued        1,500 mg 150 mL/hr over 120  Minutes Intravenous Every 48 hours 12/07/20 1531 12/08/20 0835   12/09/20 0900  Ampicillin-Sulbactam (UNASYN) 3 g in sodium chloride 0.9 % 100 mL IVPB        3 g 200 mL/hr over 30 Minutes Intravenous Every 8 hours 12/09/20 0836     12/08/20 1100  vancomycin (VANCOCIN) IVPB 1000 mg/200 mL premix  Status:  Discontinued        1,000 mg 200 mL/hr over 60 Minutes Intravenous Every 24 hours 12/07/20 1033 12/07/20 1531   12/07/20 1900  Ampicillin-Sulbactam (UNASYN) 3 g in sodium chloride 0.9 % 100 mL IVPB  Status:  Discontinued        3  g 200 mL/hr over 30 Minutes Intravenous Every 12 hours 12/07/20 1542 12/09/20 0836   12/07/20 1130  vancomycin (VANCOREADY) IVPB 1500 mg/300 mL        1,500 mg 150 mL/hr over 120 Minutes Intravenous  Once 12/07/20 1031 12/07/20 1618   12/03/20 0200  piperacillin-tazobactam (ZOSYN) IVPB 3.375 g  Status:  Discontinued        3.375 g 12.5 mL/hr over 240 Minutes Intravenous Every 8 hours 12/03/20 0121 12/07/20 1542   12/03/20 0100  piperacillin-tazobactam (ZOSYN) IVPB 3.375 g  Status:  Discontinued        3.375 g 100 mL/hr over 30 Minutes Intravenous  Once 12/03/20 0047 12/03/20 0121        Objective: Vitals:   12/11/20 0006 12/11/20 0442 12/11/20 0712 12/11/20 0853  BP: 140/64 136/75 (!) 143/83 131/61  Pulse: 78 78 76 76  Resp: '15 16 18   '$ Temp: 98.5 F (36.9 C) 98.2 F (36.8 C) 98 F (36.7 C)   TempSrc: Oral Oral Oral   SpO2: 95% 97% 97%   Weight: 80.3 kg     Height:        Intake/Output Summary (Last 24 hours) at 12/11/2020 1038 Last data filed at 12/11/2020 0852 Gross per 24 hour  Intake 749.57 ml  Output 925 ml  Net -175.43 ml    Filed Weights   12/09/20 0000 12/10/20 0400 12/11/20 0006  Weight: 79.2 kg 80.2 kg 80.3 kg    Examination: General exam: Appears comfortable  HEENT: PERRLA, oral mucosa moist, no sclera icterus or thrush Respiratory system: Clear to auscultation. Respiratory effort normal. Cardiovascular system: S1 & S2 heard, IIRR- metallic click hear Gastrointestinal system: Abdomen soft, non-tender, nondistended. Normal bowel sounds  - drain in LLQ filled with blood Central nervous system: Alert and oriented. No focal neurological deficits. Extremities: No cyanosis, clubbing or edema Skin: No rashes or ulcers Psychiatry:  Mood & affect appropriate.      Data Reviewed: I have personally reviewed following labs and imaging studies  CBC: Recent Labs  Lab 12/07/20 1001 12/08/20 0358 12/08/20 1351 12/09/20 0244 12/09/20 1651 12/10/20 0434  12/11/20 0605  WBC 12.7* 10.1 11.3* 9.2  --  6.9 6.4  NEUTROABS 10.0* 7.8*  --  6.6  --  4.4 5.2  HGB 10.8* 8.3* 7.5* 6.5* 8.0* 7.5* 7.6*  HCT 31.4* 23.9* 22.0* 19.4* 23.2* 21.7* 22.8*  MCV 97.2 98.8 98.2 99.0  --  96.0 98.3  PLT 257 207 185 185  --  163 [id number]    Basic Metabolic Panel: Recent Labs  Lab 12/06/20 0544 12/06/20 0554 12/07/20 0149 12/07/20 1001 12/08/20 0358 12/09/20 0244 12/10/20 0434  NA 137  --   --  133* 133* 134* 133*  K 3.0*  --  3.4* 3.8 5.1 4.1 3.9  CL 99  --   --  96* 100 104 101  CO2 28  --   --  '25 24 24 '$ 21*  GLUCOSE 202*  --   --  135* 151* 108* 90  BUN 12  --   --  17 21 24* 21  CREATININE 1.23  --   --  1.85* 1.91* 1.65* 1.33*  CALCIUM 8.3*  --   --  8.5* 8.4* 8.1* 8.2*  MG  --  1.3* 1.3* 1.1*  --  2.0  --   PHOS  --   --   --   --   --  2.9  --     GFR: Estimated Creatinine Clearance: 41.9 mL/min (A) (by C-G formula based on SCr of 1.33 mg/dL (H)). Liver Function Tests: Recent Labs  Lab 12/07/20 1001  AST 31  ALT 21  ALKPHOS 61  BILITOT 1.7*  PROT 5.6*  ALBUMIN 2.9*    No results for input(s): LIPASE, AMYLASE in the last 168 hours. No results for input(s): AMMONIA in the last 168 hours. Coagulation Profile: Recent Labs  Lab 12/07/20 0149 12/08/20 0358 12/09/20 0244 12/10/20 0434 12/11/20 0605  INR 1.6* 1.7* 1.9* 1.6* 1.5*    Cardiac Enzymes: No results for input(s): CKTOTAL, CKMB, CKMBINDEX, TROPONINI in the last 168 hours. BNP (last 3 results) No results for input(s): PROBNP in the last 8760 hours. HbA1C: No results for input(s): HGBA1C in the last 72 hours. CBG: Recent Labs  Lab 12/10/20 0535 12/10/20 1244 12/10/20 1751 12/10/20 2347 12/11/20 0604  GLUCAP 88 121* 99 111* 93    Lipid Profile: No results for input(s): CHOL, HDL, LDLCALC, TRIG, CHOLHDL, LDLDIRECT in the last 72 hours. Thyroid Function Tests: No results for input(s): TSH, T4TOTAL, FREET4, T3FREE, THYROIDAB in the last 72 hours. Anemia Panel: No  results for input(s): VITAMINB12, FOLATE, FERRITIN, TIBC, IRON, RETICCTPCT in the last 72 hours. Urine analysis:    Component Value Date/Time   COLORURINE YELLOW 12/02/2020 [place] 12/02/2020 1143   LABSPEC 1.015 12/02/2020 1143   PHURINE 5.0 12/02/2020 1143   GLUCOSEU NEGATIVE 12/02/2020 1143   HGBUR NEGATIVE 12/02/2020 1143   BILIRUBINUR NEGATIVE 12/02/2020 1143   KETONESUR NEGATIVE 12/02/2020 1143   PROTEINUR 30 (A) 08/07/2013 1633   UROBILINOGEN 0.2 12/02/2020 1143   NITRITE NEGATIVE 12/02/2020 1143   LEUKOCYTESUR NEGATIVE 12/02/2020 1143   Sepsis Labs: '@LABRCNTIP'$ (procalcitonin:4,lacticidven:4) ) Recent Results (from the past 240 hour(s))  Resp Panel by RT-PCR (Flu A&B, Covid) Nasopharyngeal Swab     Status: None   Collection Time: 12/02/20  7:15 PM   Specimen: Nasopharyngeal Swab; Nasopharyngeal(NP) swabs in vial transport medium  Result Value Ref Range Status   SARS Coronavirus 2 by RT PCR NEGATIVE NEGATIVE Final    Comment: (NOTE) SARS-CoV-2 target nucleic acids are NOT DETECTED.  The SARS-CoV-2 RNA is generally detectable in upper respiratory specimens during the acute phase of infection. The lowest concentration of SARS-CoV-2 viral copies this assay can detect is 138 copies/mL. A negative result does not preclude SARS-Cov-2 infection and should not be used as the sole basis for treatment or other patient management decisions. A negative result may occur with  improper specimen collection/handling, submission of specimen other than nasopharyngeal swab, presence of viral mutation(s) within the areas targeted by this assay, and inadequate number of viral copies(<138 copies/mL). A negative result must be combined with clinical observations, patient history, and epidemiological information. The expected result is Negative.  Fact Sheet for Patients:  EntrepreneurPulse.com.au  Fact Sheet for Healthcare Providers:  IncredibleEmployment.be  This test is no t yet approved or cleared by the [place] and  has been authorized for detection and/or diagnosis of SARS-CoV-2 by FDA under an Emergency Use Authorization (EUA). This EUA will remain  in effect (meaning this test can be used) for the duration of the COVID-19 declaration under Section 564(b)(1) of the Act, 21 U.S.C.section 360bbb-3(b)(1), unless the authorization is terminated  or revoked sooner.       Influenza A by PCR NEGATIVE NEGATIVE Final   Influenza B by PCR NEGATIVE NEGATIVE Final    Comment: (NOTE) The Xpert Xpress SARS-CoV-2/FLU/RSV plus assay is intended as an aid in the diagnosis of influenza from Nasopharyngeal swab specimens and should not be used as a sole basis for treatment. Nasal washings and aspirates are unacceptable for Xpert Xpress SARS-CoV-2/FLU/RSV testing.  Fact Sheet for Patients: EntrepreneurPulse.com.au  Fact Sheet for Healthcare Providers: IncredibleEmployment.be  This test is not yet approved or cleared by the [place] FDA and has been authorized for detection and/or diagnosis of SARS-CoV-2 by FDA under an Emergency Use Authorization (EUA). This EUA will remain in effect (meaning this test can be used) for the duration of the COVID-19 declaration under Section 564(b)(1) of the Act, 21 U.S.C. section 360bbb-3(b)(1), unless the authorization is terminated or revoked.  Performed at [facility], [place] [address] [place], [place] [address]   Surgical pcr screen     Status: None   Collection Time: 12/04/20  6:10 PM   Specimen: Nasal Mucosa; Nasal Swab  Result Value Ref Range Status   MRSA, PCR NEGATIVE NEGATIVE Final   Staphylococcus aureus NEGATIVE NEGATIVE Final    Comment: (NOTE) The Xpert SA Assay (FDA approved for NASAL specimens in patients 34 years of age and older), is one component of a comprehensive surveillance program.  It is not intended to diagnose infection nor to guide or monitor treatment. Performed at [facility], [place] [address] [place], [place] [address]   Urine Culture     Status: None   Collection Time: 12/07/20  9:44 AM   Specimen: Urine, Catheterized  Result Value Ref Range Status   Specimen Description URINE, CATHETERIZED  Final   Special Requests NONE  Final   Culture   Final    NO GROWTH Performed at [place] [address] [place], [place] [address]    Report Status 12/08/2020 FINAL  Final  Culture, blood (x 2)     Status: None (Preliminary result)   Collection Time: 12/07/20 10:00 AM   Specimen: BLOOD  Result Value Ref Range Status   Specimen Description BLOOD RIGHT ANTECUBITAL  Final   Special Requests   Final    BOTTLES DRAWN AEROBIC AND ANAEROBIC Blood Culture adequate volume   Culture   Final    NO GROWTH 4 DAYS Performed at [facility], [place] [address] [place], [place] [address]    Report Status PENDING  Incomplete  Culture, blood (x 2)     Status: None (Preliminary result)   Collection Time: 12/07/20 10:20 AM   Specimen: BLOOD  Result Value Ref Range Status   Specimen Description BLOOD RIGHT ANTECUBITAL  Final   Special Requests   Final    BOTTLES DRAWN AEROBIC AND ANAEROBIC Blood Culture adequate volume   Culture   Final    NO GROWTH 4 DAYS Performed at [facility], [place] [address] [place], [place] [address]    Report Status PENDING  Incomplete         Radiology Studies: No results found.    Scheduled Meds:  sodium chloride   Intravenous Once   allopurinol  300 mg Oral Daily   amLODipine  5 mg Oral Daily   atorvastatin  20 mg Oral Once per day on Wed Sat   bisoprolol  2.5 mg Oral Daily   feeding supplement  1 Container Oral TID BM   latanoprost  1 drop Both Eyes QHS   Continuous Infusions:  ampicillin-sulbactam (UNASYN) IV 3 g (12/11/20 0902)     LOS: 8 days      Debbe Odea, MD [facility] Pager: [email or web address] 12/11/2020, 10:38 AM

## 2020-12-11 NOTE — Progress Notes (Signed)
Called by nursing, JP drain filled with blood. Heparin drip placed on hold

## 2020-12-11 NOTE — Progress Notes (Addendum)
ANTICOAGULATION CONSULT NOTE - Follow Up Consult  Pharmacy Consult for heparin and warfarin Indication:  mechanical mitral valve  Allergies  Allergen Reactions   Cardizem [Diltiazem]     Bradycardia per pt Doing well on Amlodipine   Sulfonamide Derivatives     Unknown     Patient Measurements: Height: '5\' 10"'$  (177.8 cm) Weight: 80.3 kg (177 lb 0.5 oz) IBW/kg (Calculated) : 73 Heparin Dosing Weight: 80 kg  Vital Signs: Temp: 98 F (36.7 C) (09/18 0712) Temp Source: Oral (09/18 0712) BP: 143/83 (09/18 0712) Pulse Rate: 76 (09/18 0712)  Labs: Recent Labs    12/09/20 0244 12/09/20 1651 12/10/20 0434 12/10/20 2108 12/11/20 0605  HGB 6.5* 8.0* 7.5*  --  7.6*  HCT 19.4* 23.2* 21.7*  --  22.8*  PLT 185  --  163  --  191  LABPROT 21.4*  --  19.2*  --  18.5*  INR 1.9*  --  1.6*  --  1.5*  HEPARINUNFRC  --   --   --  0.23* <0.10*  CREATININE 1.65*  --  1.33*  --   --      Estimated Creatinine Clearance: 41.9 mL/min (A) (by C-G formula based on SCr of 1.33 mg/dL (H)).  Medications:  Medications Prior to Admission  Medication Sig Dispense Refill Last Dose   allopurinol (ZYLOPRIM) 300 MG tablet TAKE 1 TABLET BY MOUTH DAILY AS NEEDED. GENERIC EQUIVALENT FOR ZYLOPRIM 90 tablet 3 12/02/2020   amLODipine (NORVASC) 5 MG tablet TAKE 1/2 TABLET BY MOUTH EVERY DAY FOR 3 DAYS,IF BLOOD PRESSURE IS STILL HIGHER THAN 130 THEN INCREASE TO 1 TABLET BY MOUTH DAILY (Patient taking differently: Take 5 mg by mouth daily.) 90 tablet 3 12/02/2020   aspirin 81 MG EC tablet Take 81 mg by mouth daily.   12/02/2020   atorvastatin (LIPITOR) 20 MG tablet TAKE 1 TABLET BY MOUTH TWO TIMES A WEEK (Patient taking differently: Take 20 mg by mouth See admin instructions. TAKE 1 TABLET BY MOUTH TWO TIMES A WEEK) 26 tablet 3 12/02/2020   bisoprolol (ZEBETA) 5 MG tablet Take 0.5 tablets (2.5 mg total) by mouth daily. 45 tablet 3 Past Week   Cholecalciferol (VITAMIN D3) 50 MCG (2000 UT) capsule Take 1 capsule (2,000  Units total) by mouth daily. 100 capsule 3 12/02/2020   dipyridamole (PERSANTINE) 50 MG tablet Take 1 tablet (50 mg total) by mouth 3 (three) times daily. 270 tablet 1 12/02/2020   enalapril (VASOTEC) 20 MG tablet TAKE 1 TABLET BY MOUTH DAILY. 90 tablet 3 12/02/2020   furosemide (LASIX) 40 MG tablet TAKE 1 TABLET BY MOUTH DAILY GENERIC EQUIVALENT FOR LASIX (Patient taking differently: Take 40 mg by mouth in the morning.) 90 tablet 3 12/02/2020   latanoprost (XALATAN) 0.005 % ophthalmic solution Place 1 drop into both eyes at bedtime.   Past Week   magnesium oxide (MAG-OX) 400 MG tablet TAKE 1 TABLET(400 MG) BY MOUTH TWICE DAILY (Patient taking differently: Take 400 mg by mouth 2 (two) times daily.) 60 tablet 7 12/02/2020   Milk Thistle 1000 MG CAPS Take 1 capsule by mouth daily.   Past Week   pantoprazole (PROTONIX) 40 MG tablet TAKE 1 TABLET BY MOUTH DAILY (Patient taking differently: Take 40 mg by mouth in the morning.) 90 tablet 3 12/02/2020   warfarin (COUMADIN) 5 MG tablet TAKE 1 TABLET BY MOUTH AS DIRECTED BY THE COUMADIN CLINIC (Patient taking differently: Take 5 mg by mouth in the morning. Take 1 tablet by mouth as  directed by the coumadin clinic) 100 tablet 1 12/02/2020   Scheduled:   allopurinol  300 mg Oral Daily   amLODipine  5 mg Oral Daily   atorvastatin  20 mg Oral Once per day on Wed Sat   bisoprolol  2.5 mg Oral Daily   feeding supplement  1 Container Oral TID BM   latanoprost  1 drop Both Eyes QHS   Warfarin - Pharmacist Dosing Inpatient   Does not apply q1600   Infusions:   ampicillin-sulbactam (UNASYN) IV 3 g (12/11/20 0120)   heparin 1,350 Units/hr (12/10/20 2337)    Assessment: 87 year old male on warfarin at home for mechanical mitral valve. Patient taking warfarin 5 mg daily at home, held and reversed with vitamin K for appendectomy on 9/12. Patient has experienced blood loss requiring 1 unit of blood and anticoagulation has been on hold. Surgery and hospitalist have requested  warfarin be resumed with heparin bridging. Chart review shows history of cirrhosis, which may impact INR. Hemoglobin, hematocrit, and platelets now stable. Patient taking allopurinol PTA and inpatient.   Heparin level subtherapeutic after being held for bleeding from JP drain. Will follow up primary team's plan for anticoagulation.  Goal of Therapy:  Heparin level 0.3-0.7 INR 2.5-3.5 Monitor platelets by anticoagulation protocol: Yes   Plan:  Hold heparin infusion and warfarin Check daily INR, heparin level, and CBC  Thank you for allowing pharmacy to participate in this patient's care.  Reatha Harps, PharmD PGY1 Pharmacy Resident 12/11/2020 8:48 AM Check [email or web address] for unit specific pharmacy number

## 2020-12-12 DIAGNOSIS — K3580 Unspecified acute appendicitis: Secondary | ICD-10-CM | POA: Diagnosis not present

## 2020-12-12 DIAGNOSIS — K353 Acute appendicitis with localized peritonitis, without perforation or gangrene: Secondary | ICD-10-CM | POA: Diagnosis not present

## 2020-12-12 DIAGNOSIS — I5032 Chronic diastolic (congestive) heart failure: Secondary | ICD-10-CM | POA: Diagnosis not present

## 2020-12-12 DIAGNOSIS — I4821 Permanent atrial fibrillation: Secondary | ICD-10-CM | POA: Diagnosis not present

## 2020-12-12 LAB — TYPE AND SCREEN
ABO/RH(D): A POS
Antibody Screen: NEGATIVE
Unit division: 0

## 2020-12-12 LAB — BPAM RBC
Blood Product Expiration Date: 202210092359
ISSUE DATE / TIME: 202209181253
Unit Type and Rh: 6200

## 2020-12-12 LAB — CULTURE, BLOOD (ROUTINE X 2)
Culture: NO GROWTH
Culture: NO GROWTH
Special Requests: ADEQUATE
Special Requests: ADEQUATE

## 2020-12-12 LAB — CBC WITH DIFFERENTIAL/PLATELET
Abs Immature Granulocytes: 0.1 10*3/uL — ABNORMAL HIGH (ref 0.00–0.07)
Basophils Absolute: 0 10*3/uL (ref 0.0–0.1)
Basophils Relative: 0 %
Eosinophils Absolute: 0 10*3/uL (ref 0.0–0.5)
Eosinophils Relative: 0 %
HCT: 26.4 % — ABNORMAL LOW (ref 39.0–52.0)
Hemoglobin: 8.9 g/dL — ABNORMAL LOW (ref 13.0–17.0)
Lymphocytes Relative: 14 %
Lymphs Abs: 1 10*3/uL (ref 0.7–4.0)
MCH: 32.6 pg (ref 26.0–34.0)
MCHC: 33.7 g/dL (ref 30.0–36.0)
MCV: 96.7 fL (ref 80.0–100.0)
Monocytes Absolute: 0.2 10*3/uL (ref 0.1–1.0)
Monocytes Relative: 3 %
Myelocytes: 1 %
Neutro Abs: 5.7 10*3/uL (ref 1.7–7.7)
Neutrophils Relative %: 82 %
Platelets: 197 10*3/uL (ref 150–400)
RBC: 2.73 MIL/uL — ABNORMAL LOW (ref 4.22–5.81)
RDW: 16 % — ABNORMAL HIGH (ref 11.5–15.5)
WBC: 6.9 10*3/uL (ref 4.0–10.5)
nRBC: 0 /100 WBC
nRBC: 0.6 % — ABNORMAL HIGH (ref 0.0–0.2)

## 2020-12-12 LAB — PROTIME-INR
INR: 1.5 — ABNORMAL HIGH (ref 0.8–1.2)
Prothrombin Time: 18 seconds — ABNORMAL HIGH (ref 11.4–15.2)

## 2020-12-12 LAB — GLUCOSE, CAPILLARY
Glucose-Capillary: 134 mg/dL — ABNORMAL HIGH (ref 70–99)
Glucose-Capillary: 132 mg/dL — ABNORMAL HIGH (ref 70–99)

## 2020-12-12 NOTE — Progress Notes (Signed)
  Mobility Specialist Criteria Algorithm Info.  Mobility Team: [facility] elevated:Self regulated Activity: Ambulated in hall (in chair before and after ambulation) Range of motion: Active; All extremities Level of assistance: Modified independent, requires aide device or extra time Assistive device: Front wheel walker Minutes sitting in chair:  Minutes stood:  Minutes ambulated: 3 minutes Distance ambulated (ft): 200 ft Mobility response: Tolerated well Bed Position: Chair  Patient received in recliner chair willing to participate in mobility. Stood and ambulated mod I in hallway using RW with steady gait. Returned to recliner chair upon returning to room. Tolerated ambulation well without complaint and was left in recliner with all needs met.   HR pre: 90 HR during: 115 HR post: 98  12/12/2020 10:32 AM

## 2020-12-12 NOTE — Progress Notes (Signed)
PROGRESS NOTE    Anthony Gould   EHU:[id number]  DOB: 1933-07-13  DOA: 12/02/2020 PCP: Cassandria Anger, MD   Brief Narrative:  Anthony Gould is an 87 year old male with paroxysmal atrial fibrillation, mechanical mitral valve on Coumadin, CKD stage III AAA, hypertension, pulmonary hypertension, cirrhotic changes of the liver noted on imaging with a former history of alcohol use, diabetes mellitus, history of TIA who has been hospitalized for acute appendicitis.  The patient's Coumadin was discontinued, INR was reversed and he underwent appendectomy on 9/12.  Subsequent to this he has had some ongoing mild blood loss and anticoagulation has been on hold.   Subjective: No new complaints. Continues to have blood coming out of his drain.  Assessment & Plan:   Principal Problem:   Acute appendicitis -Status post appendectomy on 9/12 - He is on Unasyn per general surgery  Active Problems: Acute blood loss anemia-postop bleed - Heparin started post up, patient noted to bleed (into JP drain) and  Heparin held on 9/14- bleeding resolved - resumed 9/17 without a bolus  - Heparin level was 0.23 at 2108 (on 9/17) and subsequently < 0.10 at 605 but unfortunately, bleeding recurred and Heparin stopped again - Hgb 6.5> transfused  1 U PRBC on 9/16 >  8.0> 7.5> 7.6 - transfused 1 U PRBC 9/18 - 620 cc of blood in past 24 hrs   Paroxysmal atrial fibrillation, mechanical mitral valve - See above in regards to heparin - Continue bisoprolol per home dose (2.5 mg) - high risk for thrombosis at this point as he has been without proper anticoagulation since surgery  Gout - Continue allopurinol  AKI on CKD stage IIIa - Baseline creatinine appears to range from 1.2 to 1.4 - Creatinine rose to 1.9 but has subsequently improved   Hypertension with hypertensive heart disease - Continue bisoprolol  - Amlodipine and lisinopril are still on hold -BP noted to be elevated-we will resume  amlodipine (5 mg) -Lasix being held while we are carefully watching his fluid balance-oral intake remains poor and therefore I will hold Lasix again today    DM2 (?) -He is not on any medication for this at home and prior A1c's have been within the normal range     Time spent in minutes: 35 DVT prophylaxis: Place and maintain sequential compression device Start: 12/06/20 1024 Code Status: Full code Family Communication:  Level of Care: Level of care: Progressive Disposition Plan:  Status is: Inpatient  Remains inpatient appropriate because:IV treatments appropriate due to intensity of illness or inability to take PO and Inpatient level of care appropriate due to severity of illness  Dispo: The patient is from: Home              Anticipated d/c is to: Home              Patient currently is not medically stable to d/c.   Difficult to place patient No  Consultants:  General surgery Cardiology  Procedures:  Laparoscopic appendectomy 9/12 Antimicrobials:  Anti-infectives (From admission, onward)    Start     Dose/Rate Route Frequency Ordered Stop   12/09/20 1330  vancomycin (VANCOREADY) IVPB 1500 mg/300 mL  Status:  Discontinued        1,500 mg 150 mL/hr over 120 Minutes Intravenous Every 48 hours 12/07/20 1531 12/08/20 0835   12/09/20 0900  Ampicillin-Sulbactam (UNASYN) 3 g in sodium chloride 0.9 % 100 mL IVPB        3 g 200  mL/hr over 30 Minutes Intravenous Every 8 hours 12/09/20 0836     12/08/20 1100  vancomycin (VANCOCIN) IVPB 1000 mg/200 mL premix  Status:  Discontinued        1,000 mg 200 mL/hr over 60 Minutes Intravenous Every 24 hours 12/07/20 1033 12/07/20 1531   12/07/20 1900  Ampicillin-Sulbactam (UNASYN) 3 g in sodium chloride 0.9 % 100 mL IVPB  Status:  Discontinued        3 g 200 mL/hr over 30 Minutes Intravenous Every 12 hours 12/07/20 1542 12/09/20 0836   12/07/20 1130  vancomycin (VANCOREADY) IVPB 1500 mg/300 mL        1,500 mg 150 mL/hr over 120 Minutes  Intravenous  Once 12/07/20 1031 12/07/20 1618   12/03/20 0200  piperacillin-tazobactam (ZOSYN) IVPB 3.375 g  Status:  Discontinued        3.375 g 12.5 mL/hr over 240 Minutes Intravenous Every 8 hours 12/03/20 0121 12/07/20 1542   12/03/20 0100  piperacillin-tazobactam (ZOSYN) IVPB 3.375 g  Status:  Discontinued        3.375 g 100 mL/hr over 30 Minutes Intravenous  Once 12/03/20 0047 12/03/20 0121        Objective: Vitals:   12/11/20 2006 12/12/20 0038 12/12/20 0416 12/12/20 0820  BP: (!) 134/57 (!) 146/65 (!) 141/59 (!) 155/68  Pulse:  74 68 67  Resp: 17 20 20    Temp: 98.7 F (37.1 C) 98.6 F (37 C) 97.9 F (36.6 C) 98.5 F (36.9 C)  TempSrc: Oral Oral Oral Oral  SpO2: 97% 96% 95% 97%  Weight:   80.9 kg   Height:        Intake/Output Summary (Last 24 hours) at 12/12/2020 1208 Last data filed at 12/12/2020 0945 Gross per 24 hour  Intake 740.33 ml  Output 1422 ml  Net -681.67 ml    Filed Weights   12/10/20 0400 12/11/20 0006 12/12/20 0416  Weight: 80.2 kg 80.3 kg 80.9 kg    Examination: General exam: Appears comfortable  HEENT: PERRLA, oral mucosa moist, no sclera icterus or thrush Respiratory system: Clear to auscultation. Respiratory effort normal. Cardiovascular system: S1 & S2 heard, regular rate and rhythm Gastrointestinal system: Abdomen soft, non-tender, nondistended. Normal bowel sounds  - JP drain in LLQ has blood Central nervous system: Alert and oriented. No focal neurological deficits. Extremities: No cyanosis, clubbing or edema Skin: No rashes or ulcers Psychiatry:  Mood & affect appropriate.      Data Reviewed: I have personally reviewed following labs and imaging studies  CBC: Recent Labs  Lab 12/08/20 0358 12/08/20 1351 12/09/20 0244 12/09/20 1651 12/10/20 0434 12/11/20 0605 12/11/20 1731 12/12/20 0453  WBC 10.1 11.3* 9.2  --  6.9 6.4  --  6.9  NEUTROABS 7.8*  --  6.6  --  4.4 5.2  --  5.7  HGB 8.3* 7.5* 6.5* 8.0* 7.5* 7.6* 9.5* 8.9*   HCT 23.9* 22.0* 19.4* 23.2* 21.7* 22.8* 27.8* 26.4*  MCV 98.8 98.2 99.0  --  96.0 98.3  --  96.7  PLT 207 185 185  --  163 191  --  979    Basic Metabolic Panel: Recent Labs  Lab 12/06/20 0544 12/06/20 0554 12/07/20 0149 12/07/20 1001 12/08/20 0358 12/09/20 0244 12/10/20 0434  NA 137  --   --  133* 133* 134* 133*  K 3.0*  --  3.4* 3.8 5.1 4.1 3.9  CL 99  --   --  96* 100 104 101  CO2 28  --   --  25 24 24  21*  GLUCOSE 202*  --   --  135* 151* 108* 90  BUN 12  --   --  17 21 24* 21  CREATININE 1.23  --   --  1.85* 1.91* 1.65* 1.33*  CALCIUM 8.3*  --   --  8.5* 8.4* 8.1* 8.2*  MG  --  1.3* 1.3* 1.1*  --  2.0  --   PHOS  --   --   --   --   --  2.9  --     GFR: Estimated Creatinine Clearance: 41.9 mL/min (A) (by C-G formula based on SCr of 1.33 mg/dL (H)). Liver Function Tests: Recent Labs  Lab 12/07/20 1001  AST 31  ALT 21  ALKPHOS 61  BILITOT 1.7*  PROT 5.6*  ALBUMIN 2.9*    No results for input(s): LIPASE, AMYLASE in the last 168 hours. No results for input(s): AMMONIA in the last 168 hours. Coagulation Profile: Recent Labs  Lab 12/08/20 0358 12/09/20 0244 12/10/20 0434 12/11/20 0605 12/12/20 0453  INR 1.7* 1.9* 1.6* 1.5* 1.5*    Cardiac Enzymes: No results for input(s): CKTOTAL, CKMB, CKMBINDEX, TROPONINI in the last 168 hours. BNP (last 3 results) No results for input(s): PROBNP in the last 8760 hours. HbA1C: No results for input(s): HGBA1C in the last 72 hours. CBG: Recent Labs  Lab 12/10/20 1244 12/10/20 1751 12/10/20 2347 12/11/20 0604 12/11/20 1106  GLUCAP 121* 99 111* 93 120*    Lipid Profile: No results for input(s): CHOL, HDL, LDLCALC, TRIG, CHOLHDL, LDLDIRECT in the last 72 hours. Thyroid Function Tests: No results for input(s): TSH, T4TOTAL, FREET4, T3FREE, THYROIDAB in the last 72 hours. Anemia Panel: No results for input(s): VITAMINB12, FOLATE, FERRITIN, TIBC, IRON, RETICCTPCT in the last 72 hours. Urine analysis:     Component Value Date/Time   COLORURINE YELLOW 12/02/2020 [place] 12/02/2020 1143   LABSPEC 1.015 12/02/2020 1143   PHURINE 5.0 12/02/2020 1143   GLUCOSEU NEGATIVE 12/02/2020 1143   HGBUR NEGATIVE 12/02/2020 1143   BILIRUBINUR NEGATIVE 12/02/2020 1143   KETONESUR NEGATIVE 12/02/2020 1143   PROTEINUR 30 (A) 08/07/2013 1633   UROBILINOGEN 0.2 12/02/2020 1143   NITRITE NEGATIVE 12/02/2020 1143   LEUKOCYTESUR NEGATIVE 12/02/2020 1143   Sepsis Labs: @LABRCNTIP (procalcitonin:4,lacticidven:4) ) Recent Results (from the past 240 hour(s))  Resp Panel by RT-PCR (Flu A&B, Covid) Nasopharyngeal Swab     Status: None   Collection Time: 12/02/20  7:15 PM   Specimen: Nasopharyngeal Swab; Nasopharyngeal(NP) swabs in vial transport medium  Result Value Ref Range Status   SARS Coronavirus 2 by RT PCR NEGATIVE NEGATIVE Final    Comment: (NOTE) SARS-CoV-2 target nucleic acids are NOT DETECTED.  The SARS-CoV-2 RNA is generally detectable in upper respiratory specimens during the acute phase of infection. The lowest concentration of SARS-CoV-2 viral copies this assay can detect is 138 copies/mL. A negative result does not preclude SARS-Cov-2 infection and should not be used as the sole basis for treatment or other patient management decisions. A negative result may occur with  improper specimen collection/handling, submission of specimen other than nasopharyngeal swab, presence of viral mutation(s) within the areas targeted by this assay, and inadequate number of viral copies(<138 copies/mL). A negative result must be combined with clinical observations, patient history, and epidemiological information. The expected result is Negative.  Fact Sheet for Patients:  EntrepreneurPulse.com.au  Fact Sheet for Healthcare Providers:  IncredibleEmployment.be  This test is no t yet approved or cleared by the  [place] [employer]ural [employer] and  has been authorized  for detection and/or diagnosis of SARS-CoV-2 by FDA under an Print production planner (EUA). This EUA will remain  in effect (meaning this test can be used) for the duration of the COVID-19 declaration under Section 564(b)(1) of the Act, 21 U.S.C.section 360bbb-3(b)(1), unless the authorization is terminated  or revoked sooner.       Influenza A by PCR NEGATIVE NEGATIVE Final   Influenza B by PCR NEGATIVE NEGATIVE Final    Comment: (NOTE) The Xpert Xpress SARS-CoV-2/FLU/RSV plus assay is intended as an aid in the diagnosis of influenza from Nasopharyngeal swab specimens and should not be used as a sole basis for treatment. Nasal washings and aspirates are unacceptable for Xpert Xpress SARS-CoV-2/FLU/RSV testing.  Fact Sheet for Patients: EntrepreneurPulse.com.au  Fact Sheet for Healthcare Providers: IncredibleEmployment.be  This test is not yet approved or cleared by the [place] FDA and has been authorized for detection and/or diagnosis of SARS-CoV-2 by FDA under an Emergency Use Authorization (EUA). This EUA will remain in effect (meaning this test can be used) for the duration of the COVID-19 declaration under Section 564(b)(1) of the Act, 21 U.S.C. section 360bbb-3(b)(1), unless the authorization is terminated or revoked.  Performed at [facility], [place] [address] [place], [place] [address]   Surgical pcr screen     Status: None   Collection Time: 12/04/20  6:10 PM   Specimen: Nasal Mucosa; Nasal Swab  Result Value Ref Range Status   MRSA, PCR NEGATIVE NEGATIVE Final   Staphylococcus aureus NEGATIVE NEGATIVE Final    Comment: (NOTE) The Xpert SA Assay (FDA approved for NASAL specimens in patients 42 years of age and older), is one component of a comprehensive surveillance program. It is not intended to diagnose infection nor to guide or monitor treatment. Performed at [facility], [place] [address]  [place], [place] [address]   Urine Culture     Status: None   Collection Time: 12/07/20  9:44 AM   Specimen: Urine, Catheterized  Result Value Ref Range Status   Specimen Description URINE, CATHETERIZED  Final   Special Requests NONE  Final   Culture   Final    NO GROWTH Performed at [place] [address], [place], [place] [address]    Report Status 12/08/2020 FINAL  Final  Culture, blood (x 2)     Status: None   Collection Time: 12/07/20 10:00 AM   Specimen: BLOOD  Result Value Ref Range Status   Specimen Description BLOOD RIGHT ANTECUBITAL  Final   Special Requests   Final    BOTTLES DRAWN AEROBIC AND ANAEROBIC Blood Culture adequate volume   Culture   Final    NO GROWTH 5 DAYS Performed at [facility], [place] [address] [place], [place] [address]    Report Status 12/12/2020 FINAL  Final  Culture, blood (x 2)     Status: None   Collection Time: 12/07/20 10:20 AM   Specimen: BLOOD  Result Value Ref Range Status   Specimen Description BLOOD RIGHT ANTECUBITAL  Final   Special Requests   Final    BOTTLES DRAWN AEROBIC AND ANAEROBIC Blood Culture adequate volume   Culture   Final    NO GROWTH 5 DAYS Performed at [facility], [place] [address], [place], [place] [address]    Report Status 12/12/2020 FINAL  Final         Radiology Studies: No results found.  Scheduled Meds:  allopurinol  300 mg Oral Daily   amLODipine  5 mg Oral Daily   atorvastatin  20 mg Oral Once per day on Wed Sat   bisoprolol  2.5 mg Oral Daily   feeding supplement  1 Container Oral TID BM   latanoprost  1 drop Both Eyes QHS   Continuous Infusions:  ampicillin-sulbactam (UNASYN) IV 3 g (12/12/20 0859)     LOS: 9 days      Debbe Odea, MD [facility] Pager: [email or web address] 12/12/2020, 12:08 PM

## 2020-12-12 NOTE — Progress Notes (Signed)
7 Days Post-Op  Subjective: CC: Heparin held yesterday after drain output picked up and was bloody appearing so heparin was again paused. Responded to PRBC and drain output has decreased. Denies any abdominal pain, n/v. Tolerating diet. Passing flatus. Last BM 9/16. Voiding.   Objective: Vital signs in last 24 hours: Temp:  [97.9 F (36.6 C)-98.7 F (37.1 C)] 98.5 F (36.9 C) (09/19 0820) Pulse Rate:  [67-80] 67 (09/19 0820) Resp:  [16-20] 20 (09/19 0416) BP: (112-155)/(51-71) 155/68 (09/19 0820) SpO2:  [95 %-98 %] 97 % (09/19 0820) Weight:  [80.9 kg] 80.9 kg (09/19 0416) Last BM Date: 12/09/20  Intake/Output from previous day: 09/18 0701 - 09/19 0700 In: 980.3 [P.O.:580; I.V.:20.3; Blood:380] Out: 920 [Urine:300; Drains:620] Intake/Output this shift: Total I/O In: -  Out: 725 [Urine:725]  PE: Gen:  Alert, NAD, pleasant Pulm:  rate and effort normal Abd: Soft, nondistended, nontender, incisions cdi with steri strips in place (some bruising around umbilical incision), JP drain with sanguinous fluid in bulb Psych: A&Ox3  Lab Results:  Recent Labs    12/11/20 0605 12/11/20 1731 12/12/20 0453  WBC 6.4  --  6.9  HGB 7.6* 9.5* 8.9*  HCT 22.8* 27.8* 26.4*  PLT 191  --  197   BMET Recent Labs    12/10/20 0434  NA 133*  K 3.9  CL 101  CO2 21*  GLUCOSE 90  BUN 21  CREATININE 1.33*  CALCIUM 8.2*   PT/INR Recent Labs    12/11/20 0605 12/12/20 0453  LABPROT 18.5* 18.0*  INR 1.5* 1.5*   CMP     Component Value Date/Time   NA 133 (L) 12/10/2020 0434   K 3.9 12/10/2020 0434   CL 101 12/10/2020 0434   CO2 21 (L) 12/10/2020 0434   GLUCOSE 90 12/10/2020 0434   BUN 21 12/10/2020 0434   CREATININE 1.33 (H) 12/10/2020 0434   CREATININE 1.40 (H) 04/01/2015 1540   CALCIUM 8.2 (L) 12/10/2020 0434   PROT 5.6 (L) 12/07/2020 1001   ALBUMIN 2.9 (L) 12/07/2020 1001   AST 31 12/07/2020 1001   ALT 21 12/07/2020 1001   ALKPHOS 61 12/07/2020 1001   BILITOT 1.7  (H) 12/07/2020 1001   GFRNONAA 52 (L) 12/10/2020 0434   GFRAA 107 11/17/2007 1231   Lipase     Component Value Date/Time   LIPASE 15.0 12/18/2017 0828    Studies/Results: No results found.  Anti-infectives: Anti-infectives (From admission, onward)    Start     Dose/Rate Route Frequency Ordered Stop   12/09/20 1330  vancomycin (VANCOREADY) IVPB 1500 mg/300 mL  Status:  Discontinued        1,500 mg 150 mL/hr over 120 Minutes Intravenous Every 48 hours 12/07/20 1531 12/08/20 0835   12/09/20 0900  Ampicillin-Sulbactam (UNASYN) 3 g in sodium chloride 0.9 % 100 mL IVPB        3 g 200 mL/hr over 30 Minutes Intravenous Every 8 hours 12/09/20 0836     12/08/20 1100  vancomycin (VANCOCIN) IVPB 1000 mg/200 mL premix  Status:  Discontinued        1,000 mg 200 mL/hr over 60 Minutes Intravenous Every 24 hours 12/07/20 1033 12/07/20 1531   12/07/20 1900  Ampicillin-Sulbactam (UNASYN) 3 g in sodium chloride 0.9 % 100 mL IVPB  Status:  Discontinued        3 g 200 mL/hr over 30 Minutes Intravenous Every 12 hours 12/07/20 1542 12/09/20 0836   12/07/20 1130  vancomycin (VANCOREADY) IVPB 1500  mg/300 mL        1,500 mg 150 mL/hr over 120 Minutes Intravenous  Once 12/07/20 1031 12/07/20 1618   12/03/20 0200  piperacillin-tazobactam (ZOSYN) IVPB 3.375 g  Status:  Discontinued        3.375 g 12.5 mL/hr over 240 Minutes Intravenous Every 8 hours 12/03/20 0121 12/07/20 1542   12/03/20 0100  piperacillin-tazobactam (ZOSYN) IVPB 3.375 g  Status:  Discontinued        3.375 g 100 mL/hr over 30 Minutes Intravenous  Once 12/03/20 0047 12/03/20 0121        Assessment/Plan POD# 6 s/p Laparoscopic appendectomy for Acute appendicitis 9/12 Dr. Barry Dienes  - Appendiceal orifice was sutured with a pursestring 2-0 vicryl using an Endostitch, drain was placed adjacent to the cecum - Likely bled from this suture line but does not appear to be bleeding any more. Vital signs are stable. JP drain output has decreased.   - continue JP drain and monitor output - plan to continue IV antibiotics while admitted. Will not need antibiotics at discharge -Given blood transfusion yesterday with good response. Would ideally wait till tomorrow to resume heparin drip   ID - zosyn 9/10>>9/14, unasyn 9/14>>, vancomycin 9/14>>9/15 FEN - IVF per trh, heart healthy diet VTE - IV heparin held due to bleeding Foley - none   Mechanical mitral valve replacement on coumadin at home Permanent A Fib HTN CAD AKI on CKD IIIa - Cr 1.91 > 1.65 > 1.33 CHF Cirrhosis   LOS: 9 days    Jillyn Ledger , [facility] Surgery 12/12/2020, 9:58 AM Please see Amion for pager number during day hours 7:00am-4:30pm

## 2020-12-13 ENCOUNTER — Ambulatory Visit: Payer: Medicare Other | Admitting: Internal Medicine

## 2020-12-13 ENCOUNTER — Telehealth: Payer: Self-pay

## 2020-12-13 DIAGNOSIS — I4821 Permanent atrial fibrillation: Secondary | ICD-10-CM | POA: Diagnosis not present

## 2020-12-13 DIAGNOSIS — K353 Acute appendicitis with localized peritonitis, without perforation or gangrene: Secondary | ICD-10-CM | POA: Diagnosis not present

## 2020-12-13 DIAGNOSIS — I5032 Chronic diastolic (congestive) heart failure: Secondary | ICD-10-CM | POA: Diagnosis not present

## 2020-12-13 DIAGNOSIS — K3580 Unspecified acute appendicitis: Secondary | ICD-10-CM | POA: Diagnosis not present

## 2020-12-13 LAB — CBC WITH DIFFERENTIAL/PLATELET
Abs Immature Granulocytes: 0.39 10*3/uL — ABNORMAL HIGH (ref 0.00–0.07)
Basophils Absolute: 0.1 10*3/uL (ref 0.0–0.1)
Basophils Relative: 1 %
Eosinophils Absolute: 0.2 10*3/uL (ref 0.0–0.5)
Eosinophils Relative: 2 %
HCT: 26.9 % — ABNORMAL LOW (ref 39.0–52.0)
Hemoglobin: 9 g/dL — ABNORMAL LOW (ref 13.0–17.0)
Immature Granulocytes: 5 %
Lymphocytes Relative: 14 %
Lymphs Abs: 1.2 10*3/uL (ref 0.7–4.0)
MCH: 32.7 pg (ref 26.0–34.0)
MCHC: 33.5 g/dL (ref 30.0–36.0)
MCV: 97.8 fL (ref 80.0–100.0)
Monocytes Absolute: 0.6 10*3/uL (ref 0.1–1.0)
Monocytes Relative: 7 %
Neutro Abs: 5.8 10*3/uL (ref 1.7–7.7)
Neutrophils Relative %: 71 %
Platelets: 206 10*3/uL (ref 150–400)
RBC: 2.75 MIL/uL — ABNORMAL LOW (ref 4.22–5.81)
RDW: 16.2 % — ABNORMAL HIGH (ref 11.5–15.5)
WBC: 8.1 10*3/uL (ref 4.0–10.5)
nRBC: 0 % (ref 0.0–0.2)

## 2020-12-13 LAB — BASIC METABOLIC PANEL
Anion gap: 9 (ref 5–15)
BUN: 8 mg/dL (ref 8–23)
CO2: 23 mmol/L (ref 22–32)
Calcium: 8.3 mg/dL — ABNORMAL LOW (ref 8.9–10.3)
Chloride: 102 mmol/L (ref 98–111)
Creatinine, Ser: 1 mg/dL (ref 0.61–1.24)
GFR, Estimated: >60 mL/min (ref >60)
Glucose, Bld: 98 mg/dL (ref 70–99)
Potassium: 3.8 mmol/L (ref 3.5–5.1)
Sodium: 134 mmol/L — ABNORMAL LOW (ref 135–145)

## 2020-12-13 LAB — PATHOLOGIST SMEAR REVIEW

## 2020-12-13 LAB — PROTIME-INR
INR: 1.5 — ABNORMAL HIGH (ref 0.8–1.2)
Prothrombin Time: 17.8 seconds — ABNORMAL HIGH (ref 11.4–15.2)

## 2020-12-13 MED ORDER — SIMETHICONE 80 MG PO CHEW
80.0000 mg | CHEWABLE_TABLET | Freq: Four times a day (QID) | ORAL | Status: DC | PRN
  Administered 2020-12-13 (×2): 80 mg via ORAL
  Filled 2020-12-13 (×2): qty 1

## 2020-12-13 MED ORDER — SODIUM CHLORIDE 0.9 % IV SOLN
INTRAVENOUS | Status: DC | PRN
  Administered 2020-12-13: 1000 mL via INTRAVENOUS

## 2020-12-13 NOTE — Progress Notes (Addendum)
PROGRESS NOTE    Anthony Gould   HTD:[id number]  DOB: [birth year]  DOA: 12/02/2020 PCP: Cassandria Anger, MD   Brief Narrative:  Anthony Gould is an 87 year old male with paroxysmal atrial fibrillation, mechanical mitral valve on Coumadin, CKD stage III AAA, hypertension, pulmonary hypertension, cirrhotic changes of the liver noted on imaging with a former history of alcohol use, diabetes mellitus, history of TIA who has been hospitalized for acute appendicitis.  The patient's Coumadin was discontinued, INR was reversed and he underwent appendectomy on 9/12.  Subsequent to this he has had some ongoing mild blood loss and anticoagulation has been on hold.   Subjective: Has ongoing blood in his JP drain. No other complaints.   Assessment & Plan:   Principal Problem:   Acute appendicitis -Status post appendectomy on 9/12 - He is on Unasyn per general surgery  Active Problems: Acute blood loss anemia-postop bleed - Heparin started post up, patient noted to bleed (into JP drain) and  Heparin held on 9/14- bleeding resolved - resumed 9/17 without a bolus  - Heparin level was 0.23 at 2108 (on 9/17) and subsequently < 0.10 at 605 but unfortunately, bleeding recurred and Heparin stopped again - Hgb 6.5> transfused  1 U PRBC on 9/16 >  8.0> 7.5> 7.6 - transfused 1 U PRBC 9/18 - 9/19> 620 cc of blood in past 24 hrs - 9/20> 200 cc of serosanguinous fluid - Hgb remains stable at 9.0 today - cont to follow off of Heparin and follow general surgery recommendations   Paroxysmal atrial fibrillation, mechanical mitral valve - See above in regards to heparin - Continue bisoprolol per home dose (2.5 mg) - high risk for thrombosis at this point as he has been without proper anticoagulation since surgery- discussed in detail with patient  Gout - Continue allopurinol  AKI on CKD stage IIIa - Baseline creatinine appears to range from 1.2 to 1.4 - Creatinine rose to 1.9 but has  subsequently improved   Hypertension with hypertensive heart disease - Continue bisoprolol  - Amlodipine and lisinopril are still on hold -BP noted to be elevated and amlodipine (5 mg) resumed -Lasix being held while we are carefully watching his fluid balance-oral intake remains poor and therefore I will hold Lasix again today    DM2 (?) -He is not on any medication for this at home and prior A1c's have been within the normal range     Time spent in minutes: 35 DVT prophylaxis: Place and maintain sequential compression device Start: 12/06/20 1024 Code Status: Full code Family Communication:  Level of Care: Level of care: Progressive Disposition Plan:  Status is: Inpatient  Remains inpatient appropriate because:IV treatments appropriate due to intensity of illness or inability to take PO and Inpatient level of care appropriate due to severity of illness  Dispo: The patient is from: Home              Anticipated d/c is to: Home              Patient currently is not medically stable to d/c.   Difficult to place patient No  Consultants:  General surgery Cardiology  Procedures:  Laparoscopic appendectomy 9/12 Antimicrobials:  Anti-infectives (From admission, onward)    Start     Dose/Rate Route Frequency Ordered Stop   12/09/20 1330  vancomycin (VANCOREADY) IVPB 1500 mg/300 mL  Status:  Discontinued        1,500 mg 150 mL/hr over 120 Minutes Intravenous Every 48 hours  12/07/20 1531 12/08/20 0835   12/09/20 0900  Ampicillin-Sulbactam (UNASYN) 3 g in sodium chloride 0.9 % 100 mL IVPB        3 g 200 mL/hr over 30 Minutes Intravenous Every 8 hours 12/09/20 0836     12/08/20 1100  vancomycin (VANCOCIN) IVPB 1000 mg/200 mL premix  Status:  Discontinued        1,000 mg 200 mL/hr over 60 Minutes Intravenous Every 24 hours 12/07/20 1033 12/07/20 1531   12/07/20 1900  Ampicillin-Sulbactam (UNASYN) 3 g in sodium chloride 0.9 % 100 mL IVPB  Status:  Discontinued        3 g 200 mL/hr  over 30 Minutes Intravenous Every 12 hours 12/07/20 1542 12/09/20 0836   12/07/20 1130  vancomycin (VANCOREADY) IVPB 1500 mg/300 mL        1,500 mg 150 mL/hr over 120 Minutes Intravenous  Once 12/07/20 1031 12/07/20 1618   12/03/20 0200  piperacillin-tazobactam (ZOSYN) IVPB 3.375 g  Status:  Discontinued        3.375 g 12.5 mL/hr over 240 Minutes Intravenous Every 8 hours 12/03/20 0121 12/07/20 1542   12/03/20 0100  piperacillin-tazobactam (ZOSYN) IVPB 3.375 g  Status:  Discontinued        3.375 g 100 mL/hr over 30 Minutes Intravenous  Once 12/03/20 0047 12/03/20 0121        Objective: Vitals:   12/13/20 0020 12/13/20 0355 12/13/20 0740 12/13/20 1144  BP: 131/66 139/68 (!) 141/73 (!) 145/71  Pulse: 75 71 76 88  Resp: 18 18  16   Temp: 98.1 F (36.7 C) 98.6 F (37 C) 98.4 F (36.9 C) 98.5 F (36.9 C)  TempSrc: Oral Oral Oral Oral  SpO2: 100% 98% 98% 96%  Weight: 78.1 kg     Height:        Intake/Output Summary (Last 24 hours) at 12/13/2020 1230 Last data filed at 12/13/2020 1223 Gross per 24 hour  Intake 1369.77 ml  Output 600 ml  Net 769.77 ml    Filed Weights   12/11/20 0006 12/12/20 0416 12/13/20 0020  Weight: 80.3 kg 80.9 kg 78.1 kg    Examination: General exam: Appears comfortable  HEENT: PERRLA, oral mucosa moist, no sclera icterus or thrush Respiratory system: Clear to auscultation. Respiratory effort normal. Cardiovascular system: S1 & S2 heard, regular rate and rhythm- metallic click noted Gastrointestinal system: Abdomen soft, non-tender, nondistended. Normal bowel sounds  - serosanguinous fluid in P drain - appears to be about 30-40 cc Central nervous system: Alert and oriented. No focal neurological deficits. Extremities: No cyanosis, clubbing or edema Skin: No rashes or ulcers Psychiatry:  Mood & affect appropriate.      Data Reviewed: I have personally reviewed following labs and imaging studies  CBC: Recent Labs  Lab 12/09/20 0244  12/09/20 1651 12/10/20 0434 12/11/20 0605 12/11/20 1731 12/12/20 0453 12/13/20 0343  WBC 9.2  --  6.9 6.4  --  6.9 8.1  NEUTROABS 6.6  --  4.4 5.2  --  5.7 5.8  HGB 6.5*   < > 7.5* 7.6* 9.5* 8.9* 9.0*  HCT 19.4*   < > 21.7* 22.8* 27.8* 26.4* 26.9*  MCV 99.0  --  96.0 98.3  --  96.7 97.8  PLT 185  --  163 191  --  197 206   < > = values in this interval not displayed.    Basic Metabolic Panel: Recent Labs  Lab 12/07/20 0149 12/07/20 1001 12/08/20 0358 12/09/20 0244 12/10/20 0434 12/13/20 3500  NA  --  133* 133* 134* 133* 134*  K 3.4* 3.8 5.1 4.1 3.9 3.8  CL  --  96* 100 104 101 102  CO2  --  25 24 24  21* 23  GLUCOSE  --  135* 151* 108* 90 98  BUN  --  17 21 24* 21 8  CREATININE  --  1.85* 1.91* 1.65* 1.33* 1.00  CALCIUM  --  8.5* 8.4* 8.1* 8.2* 8.3*  MG 1.3* 1.1*  --  2.0  --   --   PHOS  --   --   --  2.9  --   --     GFR: Estimated Creatinine Clearance: 55.8 mL/min (by C-G formula based on SCr of 1 mg/dL). Liver Function Tests: Recent Labs  Lab 12/07/20 1001  AST 31  ALT 21  ALKPHOS 61  BILITOT 1.7*  PROT 5.6*  ALBUMIN 2.9*    No results for input(s): LIPASE, AMYLASE in the last 168 hours. No results for input(s): AMMONIA in the last 168 hours. Coagulation Profile: Recent Labs  Lab 12/09/20 0244 12/10/20 0434 12/11/20 0605 12/12/20 0453 12/13/20 0343  INR 1.9* 1.6* 1.5* 1.5* 1.5*    Cardiac Enzymes: No results for input(s): CKTOTAL, CKMB, CKMBINDEX, TROPONINI in the last 168 hours. BNP (last 3 results) No results for input(s): PROBNP in the last 8760 hours. HbA1C: No results for input(s): HGBA1C in the last 72 hours. CBG: Recent Labs  Lab 12/10/20 2347 12/11/20 0604 12/11/20 1106 12/12/20 1221 12/12/20 1852  GLUCAP 111* 93 120* 134* 132*    Lipid Profile: No results for input(s): CHOL, HDL, LDLCALC, TRIG, CHOLHDL, LDLDIRECT in the last 72 hours. Thyroid Function Tests: No results for input(s): TSH, T4TOTAL, FREET4, T3FREE, THYROIDAB  in the last 72 hours. Anemia Panel: No results for input(s): VITAMINB12, FOLATE, FERRITIN, TIBC, IRON, RETICCTPCT in the last 72 hours. Urine analysis:    Component Value Date/Time   COLORURINE YELLOW 12/02/2020 [place] 12/02/2020 1143   LABSPEC 1.015 12/02/2020 1143   PHURINE 5.0 12/02/2020 1143   GLUCOSEU NEGATIVE 12/02/2020 1143   HGBUR NEGATIVE 12/02/2020 1143   [place] 12/02/2020 1143   KETONESUR NEGATIVE 12/02/2020 1143   PROTEINUR 30 (A) 08/07/2013 1633   UROBILINOGEN 0.2 12/02/2020 1143   NITRITE NEGATIVE 12/02/2020 1143   LEUKOCYTESUR NEGATIVE 12/02/2020 1143   Sepsis Labs: @LABRCNTIP (procalcitonin:4,lacticidven:4) ) Recent Results (from the past 240 hour(s))  Surgical pcr screen     Status: None   Collection Time: 12/04/20  6:10 PM   Specimen: Nasal Mucosa; Nasal Swab  Result Value Ref Range Status   MRSA, PCR NEGATIVE NEGATIVE Final   Staphylococcus aureus NEGATIVE NEGATIVE Final    Comment: (NOTE) The Xpert SA Assay (FDA approved for NASAL specimens in patients 9 years of age and older), is one component of a comprehensive surveillance program. It is not intended to diagnose infection nor to guide or monitor treatment. Performed at [facility], [place] [address], [place], [place] [address]   Urine Culture     Status: None   Collection Time: 12/07/20  9:44 AM   Specimen: Urine, Catheterized  Result Value Ref Range Status   Specimen Description URINE, CATHETERIZED  Final   Special Requests NONE  Final   Culture   Final    NO GROWTH Performed at [facility] [address], [place], [place] [address]    Report Status 12/08/2020 FINAL  Final  Culture, blood (x 2)  Status: None   Collection Time: 12/07/20 10:00 AM   Specimen: BLOOD  Result Value Ref Range Status   Specimen Description BLOOD RIGHT ANTECUBITAL  Final   Special Requests   Final    BOTTLES DRAWN AEROBIC AND ANAEROBIC Blood Culture adequate volume    Culture   Final    NO GROWTH 5 DAYS Performed at [facility], [address], [place], [place] [address]    Report Status 12/12/2020 FINAL  Final  Culture, blood (x 2)     Status: None   Collection Time: 12/07/20 10:20 AM   Specimen: BLOOD  Result Value Ref Range Status   Specimen Description BLOOD RIGHT ANTECUBITAL  Final   Special Requests   Final    BOTTLES DRAWN AEROBIC AND ANAEROBIC Blood Culture adequate volume   Culture   Final    NO GROWTH 5 DAYS Performed at [facility], [place] [address], [place], [place] [address]    Report Status 12/12/2020 FINAL  Final         Radiology Studies: No results found.    Scheduled Meds:  allopurinol  300 mg Oral Daily   amLODipine  5 mg Oral Daily   atorvastatin  20 mg Oral Once per day on Wed Sat   bisoprolol  2.5 mg Oral Daily   feeding supplement  1 Container Oral TID BM   latanoprost  1 drop Both Eyes QHS   Continuous Infusions:  sodium chloride 1,000 mL (12/13/20 0222)   ampicillin-sulbactam (UNASYN) IV 3 g (12/13/20 0850)     LOS: 10 days      Debbe Odea, MD [facility] Pager: [email or web address] 12/13/2020, 12:30 PM

## 2020-12-13 NOTE — Progress Notes (Addendum)
8 Days Post-Op  Subjective: CC: Mild rlq pain, otherwise no abdominal pain. No PRN pain meds. Tolerating diet without n/v. BM yesterday. Mobilizing. Voiding. Drain w/ 50cc yesterday (last emptied at midnight). There appears to be ~75-80cc bloody fluid in the drain this am.   Objective: Vital signs in last 24 hours: Temp:  [98.1 F (36.7 C)-98.6 F (37 C)] 98.4 F (36.9 C) (09/20 0740) Pulse Rate:  [65-77] 76 (09/20 0740) Resp:  [16-18] 18 (09/20 0355) BP: (127-159)/(60-76) 141/73 (09/20 0740) SpO2:  [96 %-100 %] 98 % (09/20 0740) Weight:  [78.1 kg] 78.1 kg (09/20 0020) Last BM Date: 12/12/20  Intake/Output from previous day: 09/19 0701 - 09/20 0700 In: 472 [P.O.:472] Out: 1127 [Urine:1076; Drains:50; Stool:1] Intake/Output this shift: Total I/O In: -  Out: 100 [Drains:100]  PE: Gen:  Alert, NAD, pleasant Pulm:  rate and effort normal Abd: Soft, nondistended, very mild rlq tenderness, incisions cdi with steri strips in place (some bruising around umbilical incision), JP drain with bloody fluid in bulb Psych: A&Ox3  Lab Results:  Recent Labs    12/12/20 0453 12/13/20 0343  WBC 6.9 8.1  HGB 8.9* 9.0*  HCT 26.4* 26.9*  PLT 197 206   BMET No results for input(s): NA, K, CL, CO2, GLUCOSE, BUN, CREATININE, CALCIUM in the last 72 hours. PT/INR Recent Labs    12/12/20 0453 12/13/20 0343  LABPROT 18.0* 17.8*  INR 1.5* 1.5*   CMP     Component Value Date/Time   NA 133 (L) 12/10/2020 0434   K 3.9 12/10/2020 0434   CL 101 12/10/2020 0434   CO2 21 (L) 12/10/2020 0434   GLUCOSE 90 12/10/2020 0434   BUN 21 12/10/2020 0434   CREATININE 1.33 (H) 12/10/2020 0434   CREATININE 1.40 (H) 04/01/2015 1540   CALCIUM 8.2 (L) 12/10/2020 0434   PROT 5.6 (L) 12/07/2020 1001   ALBUMIN 2.9 (L) 12/07/2020 1001   AST 31 12/07/2020 1001   ALT 21 12/07/2020 1001   ALKPHOS 61 12/07/2020 1001   BILITOT 1.7 (H) 12/07/2020 1001   GFRNONAA 52 (L) 12/10/2020 0434   GFRAA 107  11/17/2007 1231   Lipase     Component Value Date/Time   LIPASE 15.0 12/18/2017 0828    Studies/Results: No results found.  Anti-infectives: Anti-infectives (From admission, onward)    Start     Dose/Rate Route Frequency Ordered Stop   12/09/20 1330  vancomycin (VANCOREADY) IVPB 1500 mg/300 mL  Status:  Discontinued        1,500 mg 150 mL/hr over 120 Minutes Intravenous Every 48 hours 12/07/20 1531 12/08/20 0835   12/09/20 0900  Ampicillin-Sulbactam (UNASYN) 3 g in sodium chloride 0.9 % 100 mL IVPB        3 g 200 mL/hr over 30 Minutes Intravenous Every 8 hours 12/09/20 0836     12/08/20 1100  vancomycin (VANCOCIN) IVPB 1000 mg/200 mL premix  Status:  Discontinued        1,000 mg 200 mL/hr over 60 Minutes Intravenous Every 24 hours 12/07/20 1033 12/07/20 1531   12/07/20 1900  Ampicillin-Sulbactam (UNASYN) 3 g in sodium chloride 0.9 % 100 mL IVPB  Status:  Discontinued        3 g 200 mL/hr over 30 Minutes Intravenous Every 12 hours 12/07/20 1542 12/09/20 0836   12/07/20 1130  vancomycin (VANCOREADY) IVPB 1500 mg/300 mL        1,500 mg 150 mL/hr over 120 Minutes Intravenous  Once 12/07/20 1031 12/07/20 1618  12/03/20 0200  piperacillin-tazobactam (ZOSYN) IVPB 3.375 g  Status:  Discontinued        3.375 g 12.5 mL/hr over 240 Minutes Intravenous Every 8 hours 12/03/20 0121 12/07/20 1542   12/03/20 0100  piperacillin-tazobactam (ZOSYN) IVPB 3.375 g  Status:  Discontinued        3.375 g 100 mL/hr over 30 Minutes Intravenous  Once 12/03/20 0047 12/03/20 0121        Assessment/Plan POD# 7 s/p Laparoscopic appendectomy for Acute appendicitis 9/12 Dr. Barry Dienes  - Appendiceal orifice was sutured with a pursestring 2-0 vicryl using an Endostitch, drain was placed adjacent to the cecum - Likely bled from this suture line  - Continue JP drain and monitor output. Bloody this morning. Hgb stable. No tachycardia. Last SBP 141 - Please continue to hold heparin. Hopefully will be able to  avoid having to take back to the OR but if continues to have bloody output despite holding blood thinners, he may need to - Plan to continue IV antibiotics while admitted. Will not need antibiotics at discharge - [place] toilet   ID - zosyn 9/10>>9/14, unasyn 9/14>>, vancomycin 9/14>>9/15 FEN - IVF per trh, heart healthy diet VTE - IV heparin held due to bleeding Foley - none   Mechanical mitral valve replacement on coumadin at home Permanent A Fib HTN CAD AKI on CKD IIIa - Cr 1.91 > 1.65 > 1.33 >> pending this am CHF Cirrhosis   LOS: 10 days    Anthony Gould , [facility] Surgery 12/13/2020, 9:17 AM Please see Amion for pager number during day hours 7:00am-4:30pm   Addendum: I personally saw the patient and performed a substantive portion of this encounter, including a complete performance of at least one of the key components (MDM, Hx and/or Exam), in conjunction with the Advanced Practice Provider Alferd Apa, [name].   This is a very rare circumstance this far out from surgery which is definitely exacerbated by his need for significant anticoagulation.  If his drain output continues to increase or his HGB drops, will proceed to the OR tomorrow for a diagnostic laparoscopy.  I discussed this with the patient and his wife and son who agree with the plans  Coralie Keens, MD

## 2020-12-13 NOTE — Telephone Encounter (Signed)
Please advise as the pts son is calling to state the pt is currently in the hospital due to having to get an Appendectomy caused by appendicitis on 12/05/2020. Pt is still having bleeding and unable to get back on his blood thinners for his AHV. Pts son has stated pt was told he may need to get another surgery. Pt wants advise from his PCP as he is needing more clarity about why is another surgery needed and what surgery it is he may be needing as he was not told the type of surgery? And what should he do going forward.  Pts son Shanon Brow can be reached at [phone].

## 2020-12-14 DIAGNOSIS — I2510 Atherosclerotic heart disease of native coronary artery without angina pectoris: Secondary | ICD-10-CM

## 2020-12-14 DIAGNOSIS — N183 Chronic kidney disease, stage 3 unspecified: Secondary | ICD-10-CM

## 2020-12-14 LAB — CBC WITH DIFFERENTIAL/PLATELET
Abs Immature Granulocytes: 0.24 10*3/uL — ABNORMAL HIGH (ref 0.00–0.07)
Basophils Absolute: 0 10*3/uL (ref 0.0–0.1)
Basophils Relative: 0 %
Eosinophils Absolute: 0.1 10*3/uL (ref 0.0–0.5)
Eosinophils Relative: 1 %
HCT: 27 % — ABNORMAL LOW (ref 39.0–52.0)
Hemoglobin: 9 g/dL — ABNORMAL LOW (ref 13.0–17.0)
Immature Granulocytes: 2 %
Lymphocytes Relative: 11 %
Lymphs Abs: 1.1 10*3/uL (ref 0.7–4.0)
MCH: 32.5 pg (ref 26.0–34.0)
MCHC: 33.3 g/dL (ref 30.0–36.0)
MCV: 97.5 fL (ref 80.0–100.0)
Monocytes Absolute: 0.8 10*3/uL (ref 0.1–1.0)
Monocytes Relative: 8 %
Neutro Abs: 8.3 10*3/uL — ABNORMAL HIGH (ref 1.7–7.7)
Neutrophils Relative %: 78 %
Platelets: 229 10*3/uL (ref 150–400)
RBC: 2.77 MIL/uL — ABNORMAL LOW (ref 4.22–5.81)
RDW: 15.9 % — ABNORMAL HIGH (ref 11.5–15.5)
WBC: 10.5 10*3/uL (ref 4.0–10.5)
nRBC: 0 % (ref 0.0–0.2)

## 2020-12-14 LAB — BASIC METABOLIC PANEL
Anion gap: 9 (ref 5–15)
BUN: 6 mg/dL — ABNORMAL LOW (ref 8–23)
CO2: 23 mmol/L (ref 22–32)
Calcium: 7.9 mg/dL — ABNORMAL LOW (ref 8.9–10.3)
Chloride: 100 mmol/L (ref 98–111)
Creatinine, Ser: 1.09 mg/dL (ref 0.61–1.24)
GFR, Estimated: >60 mL/min (ref >60)
Glucose, Bld: 99 mg/dL (ref 70–99)
Potassium: 3.7 mmol/L (ref 3.5–5.1)
Sodium: 132 mmol/L — ABNORMAL LOW (ref 135–145)

## 2020-12-14 LAB — HEPARIN LEVEL (UNFRACTIONATED): Heparin Unfractionated: 0.2 IU/mL — ABNORMAL LOW (ref 0.30–0.70)

## 2020-12-14 LAB — PROTIME-INR
INR: 1.5 — ABNORMAL HIGH (ref 0.8–1.2)
Prothrombin Time: 18.5 seconds — ABNORMAL HIGH (ref 11.4–15.2)

## 2020-12-14 MED ORDER — WARFARIN SODIUM 7.5 MG PO TABS
7.5000 mg | ORAL_TABLET | Freq: Once | ORAL | Status: AC
  Administered 2020-12-14: 7.5 mg via ORAL
  Filled 2020-12-14: qty 1

## 2020-12-14 MED ORDER — HEPARIN (PORCINE) 25000 UT/250ML-% IV SOLN
1450.0000 [IU]/h | INTRAVENOUS | Status: AC
  Administered 2020-12-14: 1350 [IU]/h via INTRAVENOUS
  Administered 2020-12-15: 1450 [IU]/h via INTRAVENOUS
  Filled 2020-12-14 (×3): qty 250

## 2020-12-14 MED ORDER — WARFARIN - PHARMACIST DOSING INPATIENT: Freq: Every day | Status: DC

## 2020-12-14 NOTE — Progress Notes (Signed)
PROGRESS NOTE    Anthony Gould   XAJ:[id number]  DOB: 12/31/1933  DOA: 12/02/2020 PCP: Cassandria Anger, MD   Brief Narrative:  Anthony Gould is an 87 year old male with paroxysmal atrial fibrillation, mechanical mitral valve on Coumadin, CKD stage III AAA, hypertension, pulmonary hypertension, cirrhotic changes of the liver noted on imaging with a former history of alcohol use, diabetes mellitus, history of TIA who has been hospitalized for acute appendicitis.  The patient's Coumadin was discontinued, INR was reversed and he underwent appendectomy on 9/12.  Subsequent to this he has had some ongoing mild blood loss and anticoagulation has been on hold.  9/21: Surgery cleared him to restart Coumadin today.  Subjective: Patient continues to have some bloody discharge in his drain.  Denies any pain.  Assessment & Plan:   Principal Problem:   Acute appendicitis -Status post appendectomy on 9/12 - He is on Unasyn per general surgery-he will complete the course today.  Active Problems: Acute blood loss anemia-postop bleed - Heparin started post up, patient noted to bleed (into JP drain) and  Heparin held on 9/14- bleeding resolved - resumed 9/17 without a bolus  - Heparin level was 0.23 at 2108 (on 9/17) and subsequently < 0.10 at 605 but unfortunately, bleeding recurred and Heparin stopped again - Hgb 6.5> transfused  1 U PRBC on 9/16 >  8.0> 7.5> 7.6 - transfused 1 U PRBC 9/18 - 9/19> 620 cc of blood in past 24 hrs - 9/20> 200 cc of serosanguinous fluid 9/21-70 cc of serosanguineous discharge with some mixing of blood. - Hgb remains stable at 9.0 today -General surgery cleared to restart Coumadin with heparin bridge. -Monitor hemoglobin closely. -Transfuse if below 7.   Paroxysmal atrial fibrillation, mechanical mitral valve - Patient was restarting on heparin infusion today along with Coumadin - Continue bisoprolol per home dose (2.5 mg) - high risk for  thrombosis-restarting anticoagulation.  Gout - Continue allopurinol  AKI on CKD stage IIIa - Baseline creatinine appears to range from 1.2 to 1.4 - Creatinine peaked at 1.9 but has subsequently improved and now appears at baseline.  Hypertension with hypertensive heart disease - Continue bisoprolol  - Continue amlodipine. -Holding lisinopril-can resume it if needed -Lasix being held while we are carefully watching his fluid balance-oral intake remains poor and therefore I will hold Lasix again today    DM2 (?) -He is not on any medication for this at home and prior A1c's have been within the normal range     Time spent in minutes: 35 DVT prophylaxis: Place and maintain sequential compression device Start: 12/06/20 1024 Code Status: Full code Family Communication:  Level of Care: Level of care: Progressive Disposition Plan:  Status is: Inpatient  Remains inpatient appropriate because:IV treatments appropriate due to intensity of illness or inability to take PO and Inpatient level of care appropriate due to severity of illness  Dispo: The patient is from: Home              Anticipated d/c is to: Home              Patient currently is not medically stable to d/c.   Difficult to place patient No  Consultants:  General surgery Cardiology  Procedures:  Laparoscopic appendectomy 9/12  Antimicrobials:  Anti-infectives (From admission, onward)    Start     Dose/Rate Route Frequency Ordered Stop   12/09/20 1330  vancomycin (VANCOREADY) IVPB 1500 mg/300 mL  Status:  Discontinued  1,500 mg 150 mL/hr over 120 Minutes Intravenous Every 48 hours 12/07/20 1531 12/08/20 0835   12/09/20 0900  Ampicillin-Sulbactam (UNASYN) 3 g in sodium chloride 0.9 % 100 mL IVPB  Status:  Discontinued        3 g 200 mL/hr over 30 Minutes Intravenous Every 8 hours 12/09/20 0836 12/14/20 1023   12/08/20 1100  vancomycin (VANCOCIN) IVPB 1000 mg/200 mL premix  Status:  Discontinued        1,000  mg 200 mL/hr over 60 Minutes Intravenous Every 24 hours 12/07/20 1033 12/07/20 1531   12/07/20 1900  Ampicillin-Sulbactam (UNASYN) 3 g in sodium chloride 0.9 % 100 mL IVPB  Status:  Discontinued        3 g 200 mL/hr over 30 Minutes Intravenous Every 12 hours 12/07/20 1542 12/09/20 0836   12/07/20 1130  vancomycin (VANCOREADY) IVPB 1500 mg/300 mL        1,500 mg 150 mL/hr over 120 Minutes Intravenous  Once 12/07/20 1031 12/07/20 1618   12/03/20 0200  piperacillin-tazobactam (ZOSYN) IVPB 3.375 g  Status:  Discontinued        3.375 g 12.5 mL/hr over 240 Minutes Intravenous Every 8 hours 12/03/20 0121 12/07/20 1542   12/03/20 0100  piperacillin-tazobactam (ZOSYN) IVPB 3.375 g  Status:  Discontinued        3.375 g 100 mL/hr over 30 Minutes Intravenous  Once 12/03/20 0047 12/03/20 0121        Objective: Vitals:   12/13/20 1144 12/13/20 1922 12/14/20 0438 12/14/20 1047  BP: (!) 145/71 133/62 129/69 (!) 145/64  Pulse: 88 78 70 78  Resp: 16 18 18    Temp: 98.5 F (36.9 C) 98.7 F (37.1 C) 99 F (37.2 C)   TempSrc: Oral Oral Oral   SpO2: 96% 97% 96%   Weight:   80.7 kg   Height:        Intake/Output Summary (Last 24 hours) at 12/14/2020 1347 Last data filed at 12/14/2020 0636 Gross per 24 hour  Intake 100 ml  Output 520 ml  Net -420 ml    Filed Weights   12/12/20 0416 12/13/20 0020 12/14/20 0438  Weight: 80.9 kg 78.1 kg 80.7 kg    Examination: General.  Frail elderly man, in no acute distress. Pulmonary.  Lungs clear bilaterally, normal respiratory effort. CV.  Regular rate and rhythm, no JVD, rub or murmur. Abdomen.  Soft, nontender, nondistended, BS positive. CNS.  Alert and oriented .  No focal neurologic deficit. Extremities.  Trace LE edema, no cyanosis, pulses intact and symmetrical. Psychiatry.  Judgment and insight appears normal.    Data Reviewed: I have personally reviewed following labs and imaging studies  CBC: Recent Labs  Lab 12/10/20 0434  12/11/20 0605 12/11/20 1731 12/12/20 0453 12/13/20 0343 12/14/20 0343  WBC 6.9 6.4  --  6.9 8.1 10.5  NEUTROABS 4.4 5.2  --  5.7 5.8 8.3*  HGB 7.5* 7.6* 9.5* 8.9* 9.0* 9.0*  HCT 21.7* 22.8* 27.8* 26.4* 26.9* 27.0*  MCV 96.0 98.3  --  96.7 97.8 97.5  PLT 163 191  --  197 206 637    Basic Metabolic Panel: Recent Labs  Lab 12/08/20 0358 12/09/20 0244 12/10/20 0434 12/13/20 0343 12/14/20 0343  NA 133* 134* 133* 134* 132*  K 5.1 4.1 3.9 3.8 3.7  CL 100 104 101 102 100  CO2 24 24 21* 23 23  GLUCOSE 151* 108* 90 98 99  BUN 21 24* 21 8 6*  CREATININE 1.91* 1.65* 1.33*  1.00 1.09  CALCIUM 8.4* 8.1* 8.2* 8.3* 7.9*  MG  --  2.0  --   --   --   PHOS  --  2.9  --   --   --     GFR: Estimated Creatinine Clearance: 51.2 mL/min (by C-G formula based on SCr of 1.09 mg/dL). Liver Function Tests: No results for input(s): AST, ALT, ALKPHOS, BILITOT, PROT, ALBUMIN in the last 168 hours.  No results for input(s): LIPASE, AMYLASE in the last 168 hours. No results for input(s): AMMONIA in the last 168 hours. Coagulation Profile: Recent Labs  Lab 12/10/20 0434 12/11/20 0605 12/12/20 0453 12/13/20 0343 12/14/20 0343  INR 1.6* 1.5* 1.5* 1.5* 1.5*    Cardiac Enzymes: No results for input(s): CKTOTAL, CKMB, CKMBINDEX, TROPONINI in the last 168 hours. BNP (last 3 results) No results for input(s): PROBNP in the last 8760 hours. HbA1C: No results for input(s): HGBA1C in the last 72 hours. CBG: Recent Labs  Lab 12/10/20 2347 12/11/20 0604 12/11/20 1106 12/12/20 1221 12/12/20 1852  GLUCAP 111* 93 120* 134* 132*    Lipid Profile: No results for input(s): CHOL, HDL, LDLCALC, TRIG, CHOLHDL, LDLDIRECT in the last 72 hours. Thyroid Function Tests: No results for input(s): TSH, T4TOTAL, FREET4, T3FREE, THYROIDAB in the last 72 hours. Anemia Panel: No results for input(s): VITAMINB12, FOLATE, FERRITIN, TIBC, IRON, RETICCTPCT in the last 72 hours. Urine analysis:    Component Value  Date/Time   COLORURINE YELLOW 12/02/2020 [place] 12/02/2020 1143   LABSPEC 1.015 12/02/2020 1143   PHURINE 5.0 12/02/2020 1143   GLUCOSEU NEGATIVE 12/02/2020 1143   HGBUR NEGATIVE 12/02/2020 1143   [place] 12/02/2020 1143   KETONESUR NEGATIVE 12/02/2020 1143   PROTEINUR 30 (A) 08/07/2013 1633   UROBILINOGEN 0.2 12/02/2020 1143   NITRITE NEGATIVE 12/02/2020 1143   LEUKOCYTESUR NEGATIVE 12/02/2020 1143   Sepsis Labs: @LABRCNTIP (procalcitonin:4,lacticidven:4) ) Recent Results (from the past 240 hour(s))  Surgical pcr screen     Status: None   Collection Time: 12/04/20  6:10 PM   Specimen: Nasal Mucosa; Nasal Swab  Result Value Ref Range Status   MRSA, PCR NEGATIVE NEGATIVE Final   Staphylococcus aureus NEGATIVE NEGATIVE Final    Comment: (NOTE) The Xpert SA Assay (FDA approved for NASAL specimens in patients 15 years of age and older), is one component of a comprehensive surveillance program. It is not intended to diagnose infection nor to guide or monitor treatment. Performed at [facility], [place] [address] [place], [place] [address]   Urine Culture     Status: None   Collection Time: 12/07/20  9:44 AM   Specimen: Urine, Catheterized  Result Value Ref Range Status   Specimen Description URINE, CATHETERIZED  Final   Special Requests NONE  Final   Culture   Final    NO GROWTH Performed at [facility] [address] [place], [place] [address]    Report Status 12/08/2020 FINAL  Final  Culture, blood (x 2)     Status: None   Collection Time: 12/07/20 10:00 AM   Specimen: BLOOD  Result Value Ref Range Status   Specimen Description BLOOD RIGHT ANTECUBITAL  Final   Special Requests   Final    BOTTLES DRAWN AEROBIC AND ANAEROBIC Blood Culture adequate volume   Culture   Final    NO GROWTH 5 DAYS Performed at [facility], [place] [address] [place], [place] [address]    Report Status 12/12/2020  FINAL  Final  Culture, blood  (x 2)     Status: None   Collection Time: 12/07/20 10:20 AM   Specimen: BLOOD  Result Value Ref Range Status   Specimen Description BLOOD RIGHT ANTECUBITAL  Final   Special Requests   Final    BOTTLES DRAWN AEROBIC AND ANAEROBIC Blood Culture adequate volume   Culture   Final    NO GROWTH 5 DAYS Performed at [facility], [address], [place], [place] [address]    Report Status 12/12/2020 FINAL  Final     Radiology Studies: No results found.   Scheduled Meds:  allopurinol  300 mg Oral Daily   amLODipine  5 mg Oral Daily   atorvastatin  20 mg Oral Once per day on Wed Sat   bisoprolol  2.5 mg Oral Daily   feeding supplement  1 Container Oral TID BM   latanoprost  1 drop Both Eyes QHS   warfarin  7.5 mg Oral ONCE-1600   Warfarin - Pharmacist Dosing Inpatient   Does not apply q1600   Continuous Infusions:  sodium chloride 1,000 mL (12/13/20 0222)   heparin 1,350 Units/hr (12/14/20 1039)     LOS: 11 days   Lorella Nimrod, MD [facility] Pager: [email or web address] 12/14/2020, 1:47 PM

## 2020-12-14 NOTE — Progress Notes (Addendum)
   12/14/20 0930  Mobility  Activity Refused mobility (Patient wants to wait until wife arrives for hallway ambulation)

## 2020-12-14 NOTE — Consult Note (Addendum)
   [facility] Inpatient Consult   12/14/2020  [name] November 10, 1933 [id number]  [facility] [ACO] Patient: Anthony Gould  Primary Care Provider:  Alain Gould, Evie Lacks, MD, [facility], [facility] is an Embedded provider that has an Embedded Chronic Care Management program and team   Patient screened for length of stay 11 days hospitalization with noted high risk score for unplanned readmission risk. Also, to assess for potential [place] Management service needs for post hospital transition.  Review of patient's medical record reveals patient is with ongoing medical treatment.  Plan:  Continue to follow progress and disposition to assess for post hospital care management needs.    For questions contact:   Natividad Brood, RN BSN [facility] Liaison  [phone] business mobile phone Toll free office [phone]  Fax number: [phone] [email or web address]

## 2020-12-14 NOTE — Progress Notes (Signed)
ANTICOAGULATION CONSULT NOTE - Initial Consult  Pharmacy Consult for heparin and warfarin Indication:  mechanical mitral valve  Allergies  Allergen Reactions   Cardizem [Diltiazem]     Bradycardia per pt Doing well on Amlodipine   Sulfonamide Derivatives     Unknown     Patient Measurements: Height: 5\' 10"  (177.8 cm) Weight: 80.7 kg (177 lb 14.6 oz) IBW/kg (Calculated) : 73 Heparin Dosing Weight: 80 kg  Vital Signs: Temp: 99 F (37.2 C) (09/21 0438) Temp Source: Oral (09/21 0438) BP: 129/69 (09/21 0438) Pulse Rate: 70 (09/21 0438)  Labs: Recent Labs    12/12/20 0453 12/13/20 0343 12/14/20 0343  HGB 8.9* 9.0* 9.0*  HCT 26.4* 26.9* 27.0*  PLT 197 206 229  LABPROT 18.0* 17.8* 18.5*  INR 1.5* 1.5* 1.5*  CREATININE  --  1.00 1.09     Estimated Creatinine Clearance: 51.2 mL/min (by C-G formula based on SCr of 1.09 mg/dL).  Medications:  Medications Prior to Admission  Medication Sig Dispense Refill Last Dose   allopurinol (ZYLOPRIM) 300 MG tablet TAKE 1 TABLET BY MOUTH DAILY AS NEEDED. GENERIC EQUIVALENT FOR ZYLOPRIM 90 tablet 3 12/02/2020   amLODipine (NORVASC) 5 MG tablet TAKE 1/2 TABLET BY MOUTH EVERY DAY FOR 3 DAYS,IF BLOOD PRESSURE IS STILL HIGHER THAN 130 THEN INCREASE TO 1 TABLET BY MOUTH DAILY (Patient taking differently: Take 5 mg by mouth daily.) 90 tablet 3 12/02/2020   aspirin 81 MG EC tablet Take 81 mg by mouth daily.   12/02/2020   atorvastatin (LIPITOR) 20 MG tablet TAKE 1 TABLET BY MOUTH TWO TIMES A WEEK (Patient taking differently: Take 20 mg by mouth See admin instructions. TAKE 1 TABLET BY MOUTH TWO TIMES A WEEK) 26 tablet 3 12/02/2020   bisoprolol (ZEBETA) 5 MG tablet Take 0.5 tablets (2.5 mg total) by mouth daily. 45 tablet 3 Past Week   Cholecalciferol (VITAMIN D3) 50 MCG (2000 UT) capsule Take 1 capsule (2,000 Units total) by mouth daily. 100 capsule 3 12/02/2020   dipyridamole (PERSANTINE) 50 MG tablet Take 1 tablet (50 mg total) by mouth 3 (three) times  daily. 270 tablet 1 12/02/2020   enalapril (VASOTEC) 20 MG tablet TAKE 1 TABLET BY MOUTH DAILY. 90 tablet 3 12/02/2020   furosemide (LASIX) 40 MG tablet TAKE 1 TABLET BY MOUTH DAILY GENERIC EQUIVALENT FOR LASIX (Patient taking differently: Take 40 mg by mouth in the morning.) 90 tablet 3 12/02/2020   latanoprost (XALATAN) 0.005 % ophthalmic solution Place 1 drop into both eyes at bedtime.   Past Week   magnesium oxide (MAG-OX) 400 MG tablet TAKE 1 TABLET(400 MG) BY MOUTH TWICE DAILY (Patient taking differently: Take 400 mg by mouth 2 (two) times daily.) 60 tablet 7 12/02/2020   Milk Thistle 1000 MG CAPS Take 1 capsule by mouth daily.   Past Week   pantoprazole (PROTONIX) 40 MG tablet TAKE 1 TABLET BY MOUTH DAILY (Patient taking differently: Take 40 mg by mouth in the morning.) 90 tablet 3 12/02/2020   warfarin (COUMADIN) 5 MG tablet TAKE 1 TABLET BY MOUTH AS DIRECTED BY THE COUMADIN CLINIC (Patient taking differently: Take 5 mg by mouth in the morning. Take 1 tablet by mouth as directed by the coumadin clinic) 100 tablet 1 12/02/2020   Scheduled:   allopurinol  300 mg Oral Daily   amLODipine  5 mg Oral Daily   atorvastatin  20 mg Oral Once per day on Wed Sat   bisoprolol  2.5 mg Oral Daily   feeding  supplement  1 Container Oral TID BM   latanoprost  1 drop Both Eyes QHS   Infusions:   sodium chloride 1,000 mL (12/13/20 0222)   ampicillin-sulbactam (UNASYN) IV 3 g (12/14/20 0018)   heparin      Assessment: 87 year old male on warfarin at home for mechanical mitral valve. Warfarin held and reversed with vitamin K for appendectomy on 9/12. Patient received 1 unit of blood on 9/18, and anticoagulation has been on hold.  Pharmacy consulted to restart warfarin with heparin bridging. PMH includes history of cirrhosis, which may impact INR. CBC stable. Will not bolus heparin to avoid bleeding again. INR today 1.5.  PTA warfarin 5 mg daily  Goal of Therapy:  Heparin level 0.3-0.7 INR 2.5-3.5 Monitor  platelets by anticoagulation protocol: Yes   Plan:  Give warfarin 7.5 mg PO x 1 Start heparin infusion at 1350 units/hr Check heparin level in 8 hours Monitor daily HL, INR, CBC, clinical course, s/sx of bleed, PO intake/diet, Drug-Drug Interactions   Thank you for allowing [place] to participate in this patients care. Jens Som, PharmD 12/14/2020 8:57 AM  **Pharmacist phone directory can be found on [email or web address] listed under [place]**

## 2020-12-14 NOTE — Progress Notes (Signed)
ANTICOAGULATION CONSULT NOTE - Follow Up Consult  Pharmacy Consult for heparin and warfarin Indication:  mechanical mitral valve  Allergies  Allergen Reactions   Cardizem [Diltiazem]     Bradycardia per pt Doing well on Amlodipine   Sulfonamide Derivatives     Unknown     Patient Measurements: Height: 5\' 10"  (177.8 cm) Weight: 80.7 kg (177 lb 14.6 oz) IBW/kg (Calculated) : 73 Heparin Dosing Weight: 80 kg  Vital Signs: Temp: 98.1 F (36.7 C) (09/21 1632) Temp Source: Oral (09/21 1632) BP: 145/64 (09/21 1047) Pulse Rate: 78 (09/21 1047)  Labs: Recent Labs    12/12/20 0453 12/13/20 0343 12/14/20 0343 12/14/20 1732  HGB 8.9* 9.0* 9.0*  --   HCT 26.4* 26.9* 27.0*  --   PLT 197 206 229  --   LABPROT 18.0* 17.8* 18.5*  --   INR 1.5* 1.5* 1.5*  --   HEPARINUNFRC  --   --   --  0.20*  CREATININE  --  1.00 1.09  --      Estimated Creatinine Clearance: 51.2 mL/min (by C-G formula based on SCr of 1.09 mg/dL).  Medications:  Medications Prior to Admission  Medication Sig Dispense Refill Last Dose   allopurinol (ZYLOPRIM) 300 MG tablet TAKE 1 TABLET BY MOUTH DAILY AS NEEDED. GENERIC EQUIVALENT FOR ZYLOPRIM 90 tablet 3 12/02/2020   amLODipine (NORVASC) 5 MG tablet TAKE 1/2 TABLET BY MOUTH EVERY DAY FOR 3 DAYS,IF BLOOD PRESSURE IS STILL HIGHER THAN 130 THEN INCREASE TO 1 TABLET BY MOUTH DAILY (Patient taking differently: Take 5 mg by mouth daily.) 90 tablet 3 12/02/2020   aspirin 81 MG EC tablet Take 81 mg by mouth daily.   12/02/2020   atorvastatin (LIPITOR) 20 MG tablet TAKE 1 TABLET BY MOUTH TWO TIMES A WEEK (Patient taking differently: Take 20 mg by mouth See admin instructions. TAKE 1 TABLET BY MOUTH TWO TIMES A WEEK) 26 tablet 3 12/02/2020   bisoprolol (ZEBETA) 5 MG tablet Take 0.5 tablets (2.5 mg total) by mouth daily. 45 tablet 3 Past Week   Cholecalciferol (VITAMIN D3) 50 MCG (2000 UT) capsule Take 1 capsule (2,000 Units total) by mouth daily. 100 capsule 3 12/02/2020    dipyridamole (PERSANTINE) 50 MG tablet Take 1 tablet (50 mg total) by mouth 3 (three) times daily. 270 tablet 1 12/02/2020   enalapril (VASOTEC) 20 MG tablet TAKE 1 TABLET BY MOUTH DAILY. 90 tablet 3 12/02/2020   furosemide (LASIX) 40 MG tablet TAKE 1 TABLET BY MOUTH DAILY GENERIC EQUIVALENT FOR LASIX (Patient taking differently: Take 40 mg by mouth in the morning.) 90 tablet 3 12/02/2020   latanoprost (XALATAN) 0.005 % ophthalmic solution Place 1 drop into both eyes at bedtime.   Past Week   magnesium oxide (MAG-OX) 400 MG tablet TAKE 1 TABLET(400 MG) BY MOUTH TWICE DAILY (Patient taking differently: Take 400 mg by mouth 2 (two) times daily.) 60 tablet 7 12/02/2020   Milk Thistle 1000 MG CAPS Take 1 capsule by mouth daily.   Past Week   pantoprazole (PROTONIX) 40 MG tablet TAKE 1 TABLET BY MOUTH DAILY (Patient taking differently: Take 40 mg by mouth in the morning.) 90 tablet 3 12/02/2020   warfarin (COUMADIN) 5 MG tablet TAKE 1 TABLET BY MOUTH AS DIRECTED BY THE COUMADIN CLINIC (Patient taking differently: Take 5 mg by mouth in the morning. Take 1 tablet by mouth as directed by the coumadin clinic) 100 tablet 1 12/02/2020   Scheduled:   allopurinol  300 mg Oral  Daily   amLODipine  5 mg Oral Daily   atorvastatin  20 mg Oral Once per day on Wed Sat   bisoprolol  2.5 mg Oral Daily   feeding supplement  1 Container Oral TID BM   latanoprost  1 drop Both Eyes QHS   Warfarin - Pharmacist Dosing Inpatient   Does not apply q1600   Infusions:   sodium chloride 10 mL/hr at 12/14/20 1656   heparin 1,350 Units/hr (12/14/20 1656)    Assessment: 87 year old male on warfarin at home for mechanical mitral valve. Warfarin held and reversed with vitamin K for appendectomy on 9/12. Patient received 1 unit of blood on 9/18, and anticoagulation has been on hold.  Pharmacy consulted to restart warfarin with heparin bridging. PMH includes history of cirrhosis, which may impact INR. CBC stable. Will not bolus heparin to  avoid bleeding again. INR today 1.5.  PTA warfarin 5 mg daily  Heparin level of 0.2 is subtherapeutic on heparin 1350 units/hr.   Goal of Therapy:  Heparin level 0.3-0.7 INR 2.5-3.5 Monitor platelets by anticoagulation protocol: Yes   Plan:  Warfarin administered appropriately as ordered tonight  Increase heparin to 1500 units/hr  Check heparin level in 8 hours Monitor daily HL, INR, CBC, clinical course, s/sx of bleed, PO intake/diet, Drug-Drug Interactions   Cristela Felt, PharmD, BCPS Clinical Pharmacist 12/14/2020 6:47 PM

## 2020-12-14 NOTE — Progress Notes (Signed)
9 Days Post-Op   Subjective/Chief Complaint: No complaints this morning Denies abdominal pain Remains hemodynamically stable   Objective: Vital signs in last 24 hours: Temp:  [98.4 F (36.9 C)-99 F (37.2 C)] 99 F (37.2 C) (09/21 0438) Pulse Rate:  [70-88] 70 (09/21 0438) Resp:  [16-18] 18 (09/21 0438) BP: (129-145)/(62-73) 129/69 (09/21 0438) SpO2:  [96 %-98 %] 96 % (09/21 0438) Weight:  [80.7 kg] 80.7 kg (09/21 0438) Last BM Date: 12/13/20  Intake/Output from previous day: 09/20 0701 - 09/21 0700 In: 1477.8 [P.O.:480; IV Piggyback:997.8] Out: 1120 [Urine:800; Drains:320] Intake/Output this shift: No intake/output data recorded.  Exam: Awake and alert Abdomen is soft and non-tender, drain is serosang  Lab Results:  Recent Labs    12/13/20 0343 12/14/20 0343  WBC 8.1 10.5  HGB 9.0* 9.0*  HCT 26.9* 27.0*  PLT 206 229   BMET Recent Labs    12/13/20 0343 12/14/20 0343  NA 134* 132*  K 3.8 3.7  CL 102 100  CO2 23 23  GLUCOSE 98 99  BUN 8 6*  CREATININE 1.00 1.09  CALCIUM 8.3* 7.9*   PT/INR Recent Labs    12/13/20 0343 12/14/20 0343  LABPROT 17.8* 18.5*  INR 1.5* 1.5*   ABG No results for input(s): PHART, HCO3 in the last 72 hours.  Invalid input(s): PCO2, PO2  Studies/Results: No results found.  Anti-infectives: Anti-infectives (From admission, onward)    Start     Dose/Rate Route Frequency Ordered Stop   12/09/20 1330  vancomycin (VANCOREADY) IVPB 1500 mg/300 mL  Status:  Discontinued        1,500 mg 150 mL/hr over 120 Minutes Intravenous Every 48 hours 12/07/20 1531 12/08/20 0835   12/09/20 0900  Ampicillin-Sulbactam (UNASYN) 3 g in sodium chloride 0.9 % 100 mL IVPB        3 g 200 mL/hr over 30 Minutes Intravenous Every 8 hours 12/09/20 0836     12/08/20 1100  vancomycin (VANCOCIN) IVPB 1000 mg/200 mL premix  Status:  Discontinued        1,000 mg 200 mL/hr over 60 Minutes Intravenous Every 24 hours 12/07/20 1033 12/07/20 1531    12/07/20 1900  Ampicillin-Sulbactam (UNASYN) 3 g in sodium chloride 0.9 % 100 mL IVPB  Status:  Discontinued        3 g 200 mL/hr over 30 Minutes Intravenous Every 12 hours 12/07/20 1542 12/09/20 0836   12/07/20 1130  vancomycin (VANCOREADY) IVPB 1500 mg/300 mL        1,500 mg 150 mL/hr over 120 Minutes Intravenous  Once 12/07/20 1031 12/07/20 1618   12/03/20 0200  piperacillin-tazobactam (ZOSYN) IVPB 3.375 g  Status:  Discontinued        3.375 g 12.5 mL/hr over 240 Minutes Intravenous Every 8 hours 12/03/20 0121 12/07/20 1542   12/03/20 0100  piperacillin-tazobactam (ZOSYN) IVPB 3.375 g  Status:  Discontinued        3.375 g 100 mL/hr over 30 Minutes Intravenous  Once 12/03/20 0047 12/03/20 0121       Assessment/Plan: s/p Procedure(s): APPENDECTOMY LAPAROSCOPIC (N/A)  Given the stability of the hemoglobin over the last 3 days, I do not think he is actively bleeding. I think anticoagulation can be restarted including heparin and coumadin Will resume his diet and repeat a CBC in the morning He agrees with the plan   Coralie Keens MD 12/14/2020

## 2020-12-15 LAB — CBC WITH DIFFERENTIAL/PLATELET
Abs Immature Granulocytes: 0.14 10*3/uL — ABNORMAL HIGH (ref 0.00–0.07)
Basophils Absolute: 0 10*3/uL (ref 0.0–0.1)
Basophils Relative: 0 %
Eosinophils Absolute: 0.1 10*3/uL (ref 0.0–0.5)
Eosinophils Relative: 1 %
HCT: 27.5 % — ABNORMAL LOW (ref 39.0–52.0)
Hemoglobin: 9.4 g/dL — ABNORMAL LOW (ref 13.0–17.0)
Immature Granulocytes: 2 %
Lymphocytes Relative: 12 %
Lymphs Abs: 1.1 10*3/uL (ref 0.7–4.0)
MCH: 33.3 pg (ref 26.0–34.0)
MCHC: 34.2 g/dL (ref 30.0–36.0)
MCV: 97.5 fL (ref 80.0–100.0)
Monocytes Absolute: 0.8 10*3/uL (ref 0.1–1.0)
Monocytes Relative: 9 %
Neutro Abs: 6.9 10*3/uL (ref 1.7–7.7)
Neutrophils Relative %: 76 %
Platelets: 226 10*3/uL (ref 150–400)
RBC: 2.82 MIL/uL — ABNORMAL LOW (ref 4.22–5.81)
RDW: 15.8 % — ABNORMAL HIGH (ref 11.5–15.5)
WBC: 9.1 10*3/uL (ref 4.0–10.5)
nRBC: 0 % (ref 0.0–0.2)

## 2020-12-15 LAB — HEPARIN LEVEL (UNFRACTIONATED): Heparin Unfractionated: 0.71 IU/mL — ABNORMAL HIGH (ref 0.30–0.70)

## 2020-12-15 LAB — PROTIME-INR
INR: 1.5 — ABNORMAL HIGH (ref 0.8–1.2)
Prothrombin Time: 17.7 seconds — ABNORMAL HIGH (ref 11.4–15.2)

## 2020-12-15 MED ORDER — WARFARIN SODIUM 7.5 MG PO TABS
7.5000 mg | ORAL_TABLET | Freq: Once | ORAL | Status: AC
  Administered 2020-12-15: 7.5 mg via ORAL
  Filled 2020-12-15: qty 1

## 2020-12-15 NOTE — Plan of Care (Signed)
  Problem: Education: Goal: Knowledge of General Education information will improve Description Including pain rating scale, medication(s)/side effects and non-pharmacologic comfort measures Outcome: Progressing   Problem: Health Behavior/Discharge Planning: Goal: Ability to manage health-related needs will improve Outcome: Progressing   

## 2020-12-15 NOTE — Telephone Encounter (Signed)
Called pt son there was no answer LMOM w/MD response. Per chart pt has hosp f/u schedule for 12/28/20.Marland KitchenJohny Chess

## 2020-12-15 NOTE — Progress Notes (Signed)
10 Days Post-Op  Subjective: CC: Doing well. No abdominal pain, n/v. Tolerating diet. BM yesterday. Voiding. Reports he is mobilizing well in the halls. Heparin restarted and drain w/ 50cc/24 hours and hgb stable.   Objective: Vital signs in last 24 hours: Temp:  [98.1 F (36.7 C)-98.2 F (36.8 C)] 98.2 F (36.8 C) (09/22 0358) Pulse Rate:  [70-78] 70 (09/22 0358) Resp:  [18] 18 (09/22 0358) BP: (120-145)/(60-64) 130/60 (09/22 0810) SpO2:  [95 %] 95 % (09/22 0358) Weight:  [81.8 kg] 81.8 kg (09/22 0358) Last BM Date: 12/14/20  Intake/Output from previous day: 09/21 0701 - 09/22 0700 In: 992.4 [P.O.:360; I.V.:632.4] Out: 750 [Urine:700; Drains:50] Intake/Output this shift: Total I/O In: 320 [P.O.:320] Out: 100 [Urine:100]  PE: Gen:  Alert, NAD, pleasant Pulm:  rate and effort normal Abd: Soft, nondistended, NT, incisions cdi with steri strips in place (some bruising around umbilical incision), JP drain with some clot in bulb and otherwise scant ss output Psych: A&Ox3    Lab Results:  Recent Labs    12/14/20 0343 12/15/20 0412  WBC 10.5 9.1  HGB 9.0* 9.4*  HCT 27.0* 27.5*  PLT 229 226   BMET Recent Labs    12/13/20 0343 12/14/20 0343  NA 134* 132*  K 3.8 3.7  CL 102 100  CO2 23 23  GLUCOSE 98 99  BUN 8 6*  CREATININE 1.00 1.09  CALCIUM 8.3* 7.9*   PT/INR Recent Labs    12/14/20 0343 12/15/20 0412  LABPROT 18.5* 17.7*  INR 1.5* 1.5*   CMP     Component Value Date/Time   NA 132 (L) 12/14/2020 0343   K 3.7 12/14/2020 0343   CL 100 12/14/2020 0343   CO2 23 12/14/2020 0343   GLUCOSE 99 12/14/2020 0343   BUN 6 (L) 12/14/2020 0343   CREATININE 1.09 12/14/2020 0343   CREATININE 1.40 (H) 04/01/2015 1540   CALCIUM 7.9 (L) 12/14/2020 0343   PROT 5.6 (L) 12/07/2020 1001   ALBUMIN 2.9 (L) 12/07/2020 1001   AST 31 12/07/2020 1001   ALT 21 12/07/2020 1001   ALKPHOS 61 12/07/2020 1001   BILITOT 1.7 (H) 12/07/2020 1001   GFRNONAA >60 12/14/2020  0343   GFRAA 107 11/17/2007 1231   Lipase     Component Value Date/Time   LIPASE 15.0 12/18/2017 0828    Studies/Results: No results found.  Anti-infectives: Anti-infectives (From admission, onward)    Start     Dose/Rate Route Frequency Ordered Stop   12/09/20 1330  vancomycin (VANCOREADY) IVPB 1500 mg/300 mL  Status:  Discontinued        1,500 mg 150 mL/hr over 120 Minutes Intravenous Every 48 hours 12/07/20 1531 12/08/20 0835   12/09/20 0900  Ampicillin-Sulbactam (UNASYN) 3 g in sodium chloride 0.9 % 100 mL IVPB  Status:  Discontinued        3 g 200 mL/hr over 30 Minutes Intravenous Every 8 hours 12/09/20 0836 12/14/20 1023   12/08/20 1100  vancomycin (VANCOCIN) IVPB 1000 mg/200 mL premix  Status:  Discontinued        1,000 mg 200 mL/hr over 60 Minutes Intravenous Every 24 hours 12/07/20 1033 12/07/20 1531   12/07/20 1900  Ampicillin-Sulbactam (UNASYN) 3 g in sodium chloride 0.9 % 100 mL IVPB  Status:  Discontinued        3 g 200 mL/hr over 30 Minutes Intravenous Every 12 hours 12/07/20 1542 12/09/20 0836   12/07/20 1130  vancomycin (VANCOREADY) IVPB 1500 mg/300 mL  1,500 mg 150 mL/hr over 120 Minutes Intravenous  Once 12/07/20 1031 12/07/20 1618   12/03/20 0200  piperacillin-tazobactam (ZOSYN) IVPB 3.375 g  Status:  Discontinued        3.375 g 12.5 mL/hr over 240 Minutes Intravenous Every 8 hours 12/03/20 0121 12/07/20 1542   12/03/20 0100  piperacillin-tazobactam (ZOSYN) IVPB 3.375 g  Status:  Discontinued        3.375 g 100 mL/hr over 30 Minutes Intravenous  Once 12/03/20 0047 12/03/20 0121        Assessment/Plan POD#9  s/p Laparoscopic appendectomy for Acute appendicitis 9/12 Dr. Barry Dienes  - Appendiceal orifice was sutured with a pursestring 2-0 vicryl using an Endostitch, drain was placed adjacent to the cecum - Likely bled from this suture line  - Heparin > Coumadin restarted. Drain output remains stable and hgb stable. Okay to start transitioning to home  Coumadin/INR goal. INR currently 1.5.  - Continue JP drain and monitor output. Once at INR goal, if no issues would plan to remove drain prior to d/c - Comp abx - Mobilize - Pulm toilet   ID - zosyn 9/10>>9/14, vancomycin 9/14>>9/15, unasyn 9/14 - 9/21 FEN - IVF per trh, heart healthy diet VTE - IV heparin > Coumadin  Foley - none   Mechanical mitral valve replacement on coumadin at home Permanent A Fib HTN CAD AKI on CKD IIIa - Cr 1.91 > 1.65 > 1.33 >> 1.09 CHF Cirrhosis   LOS: 12 days    Jillyn Ledger , [facility] Surgery 12/15/2020, 8:36 AM Please see Amion for pager number during day hours 7:00am-4:30pm

## 2020-12-15 NOTE — Progress Notes (Signed)
PROGRESS NOTE    Anthony Gould   BJY:[id number]  DOB: [birth year]  DOA: 12/02/2020 PCP: Cassandria Anger, MD   Brief Narrative:  Anthony Gould is an 87 year old male with paroxysmal atrial fibrillation, mechanical mitral valve on Coumadin, CKD stage III AAA, hypertension, pulmonary hypertension, cirrhotic changes of the liver noted on imaging with a former history of alcohol use, diabetes mellitus, history of TIA who has been hospitalized for acute appendicitis.  The patient's Coumadin was discontinued, INR was reversed and he underwent appendectomy on 9/12.  Subsequent to this he has had some ongoing mild blood loss and anticoagulation has been on hold.  9/21: Surgery cleared him to restart Coumadin today.  Per surgery drain will be removed tomorrow before discharge.  Subjective: Patient was seen and examined today.  Sitting comfortably in chair.  No new complaints.  He was able to walk down the hall without any difficulty.  Drain with some bloody secretions.  Wife at bedside.  Assessment & Plan:   Principal Problem:   Acute appendicitis -Status post appendectomy on 9/12 -He completed a course of Unasyn.  Active Problems: Acute blood loss anemia-postop bleed - Heparin started post up, patient noted to bleed (into JP drain) and  Heparin held on 9/14- bleeding resolved - resumed 9/17 without a bolus  - Heparin level was 0.23 at 2108 (on 9/17) and subsequently < 0.10 at 605 but unfortunately, bleeding recurred and Heparin stopped again - Hgb 6.5> transfused  1 U PRBC on 9/16 >  8.0> 7.5> 7.6 - transfused 1 U PRBC 9/18 - 9/19> 620 cc of blood in past 24 hrs - 9/20> 200 cc of serosanguinous fluid 9/21-70 cc of serosanguineous discharge with some mixing of blood. - Hgb started improving, 9.4 today -General surgery cleared to restart Coumadin with heparin bridge. -Monitor hemoglobin closely. -Transfuse if below 7.   Paroxysmal atrial fibrillation, mechanical mitral valve -  Patient was restarting on heparin infusion today along with Coumadin - Continue bisoprolol per home dose (2.5 mg) - high risk for thrombosis-Coumadin was restarted with bridge of heparin.  Gout - Continue allopurinol  AKI on CKD stage IIIa - Baseline creatinine appears to range from 1.2 to 1.4 - Creatinine peaked at 1.9 but has subsequently improved and now appears at baseline.  Hypertension with hypertensive heart disease - Continue bisoprolol  - Continue amlodipine. -Holding lisinopril-can resume it if needed -Lasix being held while we are carefully watching his fluid balance-oral intake remains poor and therefore I will hold Lasix again today    DM2 (?) -He is not on any medication for this at home and prior A1c's have been within the normal range     Time spent in minutes: 34 DVT prophylaxis: Coumadin Code Status: Full code Family Communication:  Level of Care: Level of care: Progressive Disposition Plan:  Status is: Inpatient  Remains inpatient appropriate because:IV treatments appropriate due to intensity of illness or inability to take PO and Inpatient level of care appropriate due to severity of illness  Dispo: The patient is from: Home              Anticipated d/c is to: Home              Patient currently is not medically stable to d/c.   Difficult to place patient No  Consultants:  General surgery Cardiology  Procedures:  Laparoscopic appendectomy 9/12  Antimicrobials:  Anti-infectives (From admission, onward)    Start     Dose/Rate Route  Frequency Ordered Stop   12/09/20 1330  vancomycin (VANCOREADY) IVPB 1500 mg/300 mL  Status:  Discontinued        1,500 mg 150 mL/hr over 120 Minutes Intravenous Every 48 hours 12/07/20 1531 12/08/20 0835   12/09/20 0900  Ampicillin-Sulbactam (UNASYN) 3 g in sodium chloride 0.9 % 100 mL IVPB  Status:  Discontinued        3 g 200 mL/hr over 30 Minutes Intravenous Every 8 hours 12/09/20 0836 12/14/20 1023   12/08/20 1100   vancomycin (VANCOCIN) IVPB 1000 mg/200 mL premix  Status:  Discontinued        1,000 mg 200 mL/hr over 60 Minutes Intravenous Every 24 hours 12/07/20 1033 12/07/20 1531   12/07/20 1900  Ampicillin-Sulbactam (UNASYN) 3 g in sodium chloride 0.9 % 100 mL IVPB  Status:  Discontinued        3 g 200 mL/hr over 30 Minutes Intravenous Every 12 hours 12/07/20 1542 12/09/20 0836   12/07/20 1130  vancomycin (VANCOREADY) IVPB 1500 mg/300 mL        1,500 mg 150 mL/hr over 120 Minutes Intravenous  Once 12/07/20 1031 12/07/20 1618   12/03/20 0200  piperacillin-tazobactam (ZOSYN) IVPB 3.375 g  Status:  Discontinued        3.375 g 12.5 mL/hr over 240 Minutes Intravenous Every 8 hours 12/03/20 0121 12/07/20 1542   12/03/20 0100  piperacillin-tazobactam (ZOSYN) IVPB 3.375 g  Status:  Discontinued        3.375 g 100 mL/hr over 30 Minutes Intravenous  Once 12/03/20 0047 12/03/20 0121        Objective: Vitals:   12/15/20 0358 12/15/20 0810 12/15/20 1025 12/15/20 1250  BP: 120/63 130/60  (!) 125/56  Pulse: 70  77 72  Resp: 18     Temp: 98.2 F (36.8 C)   98.2 F (36.8 C)  TempSrc: Oral   Oral  SpO2: 95%   96%  Weight: 81.8 kg     Height:        Intake/Output Summary (Last 24 hours) at 12/15/2020 1613 Last data filed at 12/15/2020 1300 Gross per 24 hour  Intake 1120.41 ml  Output 1020 ml  Net 100.41 ml    Filed Weights   12/13/20 0020 12/14/20 0438 12/15/20 0358  Weight: 78.1 kg 80.7 kg 81.8 kg    Examination: General.  Frail elderly man, in no acute distress. Pulmonary.  Lungs clear bilaterally, normal respiratory effort. CV.  Regular rate and rhythm, no JVD, rub or murmur. Abdomen.  Soft, nontender, nondistended, BS positive. CNS.  Alert and oriented .  No focal neurologic deficit. Extremities.  Trace LE edema, no cyanosis, pulses intact and symmetrical. Psychiatry.  Judgment and insight appears normal.    Data Reviewed: I have personally reviewed following labs and imaging  studies  CBC: Recent Labs  Lab 12/11/20 0605 12/11/20 1731 12/12/20 0453 12/13/20 0343 12/14/20 0343 12/15/20 0412  WBC 6.4  --  6.9 8.1 10.5 9.1  NEUTROABS 5.2  --  5.7 5.8 8.3* 6.9  HGB 7.6* 9.5* 8.9* 9.0* 9.0* 9.4*  HCT 22.8* 27.8* 26.4* 26.9* 27.0* 27.5*  MCV 98.3  --  96.7 97.8 97.5 97.5  PLT 191  --  197 206 229 644    Basic Metabolic Panel: Recent Labs  Lab 12/09/20 0244 12/10/20 0434 12/13/20 0343 12/14/20 0343  NA 134* 133* 134* 132*  K 4.1 3.9 3.8 3.7  CL 104 101 102 100  CO2 24 21* 23 23  GLUCOSE 108* 90  98 99  BUN 24* 21 8 6*  CREATININE 1.65* 1.33* 1.00 1.09  CALCIUM 8.1* 8.2* 8.3* 7.9*  MG 2.0  --   --   --   PHOS 2.9  --   --   --     GFR: Estimated Creatinine Clearance: 51.2 mL/min (by C-G formula based on SCr of 1.09 mg/dL). Liver Function Tests: No results for input(s): AST, ALT, ALKPHOS, BILITOT, PROT, ALBUMIN in the last 168 hours.  No results for input(s): LIPASE, AMYLASE in the last 168 hours. No results for input(s): AMMONIA in the last 168 hours. Coagulation Profile: Recent Labs  Lab 12/11/20 0605 12/12/20 0453 12/13/20 0343 12/14/20 0343 12/15/20 0412  INR 1.5* 1.5* 1.5* 1.5* 1.5*    Cardiac Enzymes: No results for input(s): CKTOTAL, CKMB, CKMBINDEX, TROPONINI in the last 168 hours. BNP (last 3 results) No results for input(s): PROBNP in the last 8760 hours. HbA1C: No results for input(s): HGBA1C in the last 72 hours. CBG: Recent Labs  Lab 12/10/20 2347 12/11/20 0604 12/11/20 1106 12/12/20 1221 12/12/20 1852  GLUCAP 111* 93 120* 134* 132*    Lipid Profile: No results for input(s): CHOL, HDL, LDLCALC, TRIG, CHOLHDL, LDLDIRECT in the last 72 hours. Thyroid Function Tests: No results for input(s): TSH, T4TOTAL, FREET4, T3FREE, THYROIDAB in the last 72 hours. Anemia Panel: No results for input(s): VITAMINB12, FOLATE, FERRITIN, TIBC, IRON, RETICCTPCT in the last 72 hours. Urine analysis:    Component Value Date/Time    COLORURINE YELLOW 12/02/2020 [place] 12/02/2020 1143   LABSPEC 1.015 12/02/2020 1143   PHURINE 5.0 12/02/2020 1143   GLUCOSEU NEGATIVE 12/02/2020 1143   HGBUR NEGATIVE 12/02/2020 1143   [place] 12/02/2020 1143   KETONESUR NEGATIVE 12/02/2020 1143   PROTEINUR 30 (A) 08/07/2013 1633   UROBILINOGEN 0.2 12/02/2020 1143   NITRITE NEGATIVE 12/02/2020 1143   LEUKOCYTESUR NEGATIVE 12/02/2020 1143   Sepsis Labs: @LABRCNTIP (procalcitonin:4,lacticidven:4) ) Recent Results (from the past 240 hour(s))  Urine Culture     Status: None   Collection Time: 12/07/20  9:44 AM   Specimen: Urine, Catheterized  Result Value Ref Range Status   Specimen Description URINE, CATHETERIZED  Final   Special Requests NONE  Final   Culture   Final    NO GROWTH Performed at [facility], [place] [address], [place], [place] [address]    Report Status 12/08/2020 FINAL  Final  Culture, blood (x 2)     Status: None   Collection Time: 12/07/20 10:00 AM   Specimen: BLOOD  Result Value Ref Range Status   Specimen Description BLOOD RIGHT ANTECUBITAL  Final   Special Requests   Final    BOTTLES DRAWN AEROBIC AND ANAEROBIC Blood Culture adequate volume   Culture   Final    NO GROWTH 5 DAYS Performed at [facility], [place] [address], [place], [place] [address]    Report Status 12/12/2020 FINAL  Final  Culture, blood (x 2)     Status: None   Collection Time: 12/07/20 10:20 AM   Specimen: BLOOD  Result Value Ref Range Status   Specimen Description BLOOD RIGHT ANTECUBITAL  Final   Special Requests   Final    BOTTLES DRAWN AEROBIC AND ANAEROBIC Blood Culture adequate volume   Culture   Final    NO GROWTH 5 DAYS Performed at [facility], [place] [address], [place],  [address]    Report Status 12/12/2020 FINAL  Final  Radiology Studies: No results found.   Scheduled Meds:  allopurinol  300 mg Oral Daily   amLODipine  5 mg Oral Daily   atorvastatin  20  mg Oral Once per day on Wed Sat   bisoprolol  2.5 mg Oral Daily   feeding supplement  1 Container Oral TID BM   latanoprost  1 drop Both Eyes QHS   warfarin  7.5 mg Oral ONCE-1600   Warfarin - Pharmacist Dosing Inpatient   Does not apply q1600   Continuous Infusions:  sodium chloride 10 mL/hr at 12/14/20 1656   heparin 1,450 Units/hr (12/15/20 1421)     LOS: 12 days   Lorella Nimrod, MD [facility] Pager: [email or web address] 12/15/2020, 4:13 PM

## 2020-12-15 NOTE — Evaluation (Signed)
Physical Therapy Evaluation & Discharge Patient Details Name: Anthony Gould MRN: [id number] DOB: [birth year] Today's Date: 12/15/2020  History of Present Illness  Pt is 87 yo male who presented on 12/02/20 with abdominal pain and found to have appendicitis.  He is s/p appendectomy on 12/05/20. Pt with hx of pAF on warfarin, mechanical mitral valve, CKD IIIa, pHTN, HTN, and TIA  Clinical Impression  Patient has been ambulating with RW since admission with mobility techs and wants to return to independence without AD. Patient ambulated without AD modI. No LOB noted throughout ambulation. Patient feels at his baseline. No further skilled PT needs required acutely. No PT follow up recommended at this time.        Recommendations for follow up therapy are one component of a multi-disciplinary discharge planning process, led by the attending physician.  Recommendations may be updated based on patient status, additional functional criteria and insurance authorization.  Follow Up Recommendations No PT follow up    Equipment Recommendations  None recommended by PT    Recommendations for Other Services       Precautions / Restrictions Precautions Precautions: None Restrictions Weight Bearing Restrictions: No      Mobility  Bed Mobility Overal bed mobility: Modified Independent                  Transfers Overall transfer level: Modified independent                  Ambulation/Gait Ambulation/Gait assistance: Modified independent (Device/Increase time) Gait Distance (Feet): 300 Feet Assistive device: None Gait Pattern/deviations: WFL(Within Functional Limits)        Stairs            Wheelchair Mobility    Modified Rankin (Stroke Patients Only)       Balance Overall balance assessment: Modified Independent                                           Pertinent Vitals/Pain Pain Assessment: No/denies pain    Home Living  Family/patient expects to be discharged to:: Private residence Living Arrangements: Spouse/significant other Available Help at Discharge: Family;Available 24 hours/day Type of Home: House Home Access: Level entry     Home Layout: Two level;Bed/bath upstairs Home Equipment: Cane - single point;Criag Wicklund - 2 wheels;Shower seat Additional Comments: does not use DME; also has stair lift if needed    Prior Function Level of Independence: Independent         Comments: Pt completely independent with driving, IADLs, ADLs, and community ambulation     Hand Dominance        Extremity/Trunk Assessment   Upper Extremity Assessment Upper Extremity Assessment: Overall WFL for tasks assessed    Lower Extremity Assessment Lower Extremity Assessment: Overall WFL for tasks assessed    Cervical / Trunk Assessment Cervical / Trunk Assessment: Normal  Communication   Communication: HOH  Cognition Arousal/Alertness: Awake/alert Behavior During Therapy: WFL for tasks assessed/performed Overall Cognitive Status: Within Functional Limits for tasks assessed                                        General Comments      Exercises     Assessment/Plan    PT Assessment Patent does not need any further PT services  PT Problem List         PT Treatment Interventions      PT Goals (Current goals can be found in the Care Plan section)  Acute Rehab PT Goals Patient Stated Goal: return home PT Goal Formulation: All assessment and education complete, DC therapy    Frequency     Barriers to discharge        Co-evaluation               AM-PAC PT "6 Clicks" Mobility  Outcome Measure Help needed turning from your back to your side while in a flat bed without using bedrails?: None Help needed moving from lying on your back to sitting on the side of a flat bed without using bedrails?: None Help needed moving to and from a bed to a chair (including a wheelchair)?:  None Help needed standing up from a chair using your arms (e.g., wheelchair or bedside chair)?: None Help needed to walk in hospital room?: None Help needed climbing 3-5 steps with a railing? : None 6 Click Score: 24    End of Session   Activity Tolerance: Patient tolerated treatment well Patient left: in chair;with call bell/phone within reach;with family/visitor present Nurse Communication: Mobility status PT Visit Diagnosis: Muscle weakness (generalized) (M62.81)    Time: 3832-9191 PT Time Calculation (min) (ACUTE ONLY): 9 min   Charges:   PT Evaluation $PT Eval Low Complexity: 1 Low          Sayla Golonka A. Gilford Rile PT, DPT Acute Rehabilitation Services Pager [phone] Office [phone]   Linna Hoff 12/15/2020, 12:37 PM

## 2020-12-15 NOTE — TOC Progression Note (Signed)
Transition of Care [facility]) - Progression Note    Patient Details  Name: Anthony Gould MRN: [id number] Date of Birth: Jan 31, 1934  [facility] CM/SW Contact  Zenon Mayo, RN Phone Number: 12/15/2020, 4:06 PM  Clinical Narrative:    bridging from heparin to coumadin, does not need surgery jp drain to be removed prior to dc. Conts on iv abx.  TOC will continue to follow for dc needs.         Expected Discharge Plan and Services                                                 Social Determinants of Health (SDOH) Interventions    Readmission Risk Interventions No flowsheet data found.

## 2020-12-15 NOTE — Progress Notes (Signed)
Mobility Specialist Progress Note:   12/15/20 1025  Therapy Vitals  Pulse Rate 77  Mobility  Activity Ambulated in hall  Range of Motion/Exercises Active;All extremities  Level of Assistance Modified independent, requires aide device or extra time  Assistive Device Front wheel walker  Minutes Ambulated 6 minutes  Distance Ambulated (ft) 520 ft  Mobility Ambulated with assistance in hallway  Mobility Response Tolerated well  Mobility performed by Mobility specialist  Bed Position Chair  Transport method Ambulatory  $Mobility charge 1 Mobility   Pre- Mobility: HR 77 During Mobility: HR 90 Post Mobility:  HR 83  Pt. Received in bed and willing to participate in mobility.  Ambulated min G with RW. Pt stated he doesn't use walker at home so wants to try without next walk. We will plan to see patient and try without. Pt returned to chair with all needs met and family present.   [facility] Health and safety inspector Phone [phone]

## 2020-12-15 NOTE — Progress Notes (Signed)
ANTICOAGULATION CONSULT NOTE - Follow Up Consult  Pharmacy Consult for heparin and warfarin Indication:  mechanical mitral valve  Allergies  Allergen Reactions   Cardizem [Diltiazem]     Bradycardia per pt Doing well on Amlodipine   Sulfonamide Derivatives     Unknown     Patient Measurements: Height: 5\' 10"  (177.8 cm) Weight: 81.8 kg (180 lb 5.4 oz) IBW/kg (Calculated) : 73 Heparin Dosing Weight: 80 kg  Vital Signs: Temp: 98.2 F (36.8 C) (09/22 1250) Temp Source: Oral (09/22 1250) BP: 125/56 (09/22 1250) Pulse Rate: 72 (09/22 1250)  Labs: Recent Labs    12/13/20 0343 12/14/20 0343 12/14/20 1732 12/15/20 0412 12/15/20 1243  HGB 9.0* 9.0*  --  9.4*  --   HCT 26.9* 27.0*  --  27.5*  --   PLT 206 229  --  226  --   LABPROT 17.8* 18.5*  --  17.7*  --   INR 1.5* 1.5*  --  1.5*  --   HEPARINUNFRC  --   --  0.20*  --  0.71*  CREATININE 1.00 1.09  --   --   --      Estimated Creatinine Clearance: 51.2 mL/min (by C-G formula based on SCr of 1.09 mg/dL).  Medications:  Medications Prior to Admission  Medication Sig Dispense Refill Last Dose   allopurinol (ZYLOPRIM) 300 MG tablet TAKE 1 TABLET BY MOUTH DAILY AS NEEDED. GENERIC EQUIVALENT FOR ZYLOPRIM 90 tablet 3 12/02/2020   amLODipine (NORVASC) 5 MG tablet TAKE 1/2 TABLET BY MOUTH EVERY DAY FOR 3 DAYS,IF BLOOD PRESSURE IS STILL HIGHER THAN 130 THEN INCREASE TO 1 TABLET BY MOUTH DAILY (Patient taking differently: Take 5 mg by mouth daily.) 90 tablet 3 12/02/2020   aspirin 81 MG EC tablet Take 81 mg by mouth daily.   12/02/2020   atorvastatin (LIPITOR) 20 MG tablet TAKE 1 TABLET BY MOUTH TWO TIMES A WEEK (Patient taking differently: Take 20 mg by mouth See admin instructions. TAKE 1 TABLET BY MOUTH TWO TIMES A WEEK) 26 tablet 3 12/02/2020   bisoprolol (ZEBETA) 5 MG tablet Take 0.5 tablets (2.5 mg total) by mouth daily. 45 tablet 3 Past Week   Cholecalciferol (VITAMIN D3) 50 MCG (2000 UT) capsule Take 1 capsule (2,000 Units total)  by mouth daily. 100 capsule 3 12/02/2020   dipyridamole (PERSANTINE) 50 MG tablet Take 1 tablet (50 mg total) by mouth 3 (three) times daily. 270 tablet 1 12/02/2020   enalapril (VASOTEC) 20 MG tablet TAKE 1 TABLET BY MOUTH DAILY. 90 tablet 3 12/02/2020   furosemide (LASIX) 40 MG tablet TAKE 1 TABLET BY MOUTH DAILY GENERIC EQUIVALENT FOR LASIX (Patient taking differently: Take 40 mg by mouth in the morning.) 90 tablet 3 12/02/2020   latanoprost (XALATAN) 0.005 % ophthalmic solution Place 1 drop into both eyes at bedtime.   Past Week   magnesium oxide (MAG-OX) 400 MG tablet TAKE 1 TABLET(400 MG) BY MOUTH TWICE DAILY (Patient taking differently: Take 400 mg by mouth 2 (two) times daily.) 60 tablet 7 12/02/2020   Milk Thistle 1000 MG CAPS Take 1 capsule by mouth daily.   Past Week   pantoprazole (PROTONIX) 40 MG tablet TAKE 1 TABLET BY MOUTH DAILY (Patient taking differently: Take 40 mg by mouth in the morning.) 90 tablet 3 12/02/2020   warfarin (COUMADIN) 5 MG tablet TAKE 1 TABLET BY MOUTH AS DIRECTED BY THE COUMADIN CLINIC (Patient taking differently: Take 5 mg by mouth in the morning. Take 1 tablet by  mouth as directed by the coumadin clinic) 100 tablet 1 12/02/2020   Scheduled:   allopurinol  300 mg Oral Daily   amLODipine  5 mg Oral Daily   atorvastatin  20 mg Oral Once per day on Wed Sat   bisoprolol  2.5 mg Oral Daily   feeding supplement  1 Container Oral TID BM   latanoprost  1 drop Both Eyes QHS   Warfarin - Pharmacist Dosing Inpatient   Does not apply q1600   Infusions:   sodium chloride 10 mL/hr at 12/14/20 1656   heparin 1,500 Units/hr (12/15/20 0500)    Assessment: 87 year old male on warfarin at home for mechanical mitral valve. Warfarin held and reversed with vitamin K for appendectomy on 9/12. Patient received 1 unit of blood on 9/18, and anticoagulation has been on hold.  Pharmacy consulted to restart warfarin with heparin bridging. PMH includes history of cirrhosis, which may impact INR.  CBC stable. Will not bolus heparin to avoid bleeding again. INR today 1.5.  PTA warfarin 5 mg daily  Heparin level increased to 0.71 (slightly supratherapeutic) s/p rate increase to 1500 units/hr, INR 1.5 with warfarin started only yesterday evening, taking 25-50% of diet   Goal of Therapy:  Heparin level 0.3-0.7 INR 2.5-3.5 Monitor platelets by anticoagulation protocol: Yes   Plan:  Decrease heparin gtt slightly to 1450 units/hr Give warfarin 7.5mg  PO x 1 today Daily heparin level, INR, CBC, s/s bleeding  Bertis Ruddy, PharmD Clinical Pharmacist ED Pharmacist Phone # [phone] 12/15/2020 1:54 PM

## 2020-12-15 NOTE — Telephone Encounter (Signed)
I am aware of Anthony Gould's problems.  I am sorry he is sick.  Anthony Gould needs to contact Anthony Gould's attending hospitalist to discuss the case however. Thanks,

## 2020-12-16 LAB — FERRITIN: Ferritin: 247 ng/mL (ref 24–336)

## 2020-12-16 LAB — FOLATE: Folate: 8 ng/mL (ref >5.9)

## 2020-12-16 LAB — CBC WITH DIFFERENTIAL/PLATELET
Abs Immature Granulocytes: 0.06 10*3/uL (ref 0.00–0.07)
Basophils Absolute: 0 10*3/uL (ref 0.0–0.1)
Basophils Relative: 1 %
Eosinophils Absolute: 0.1 10*3/uL (ref 0.0–0.5)
Eosinophils Relative: 1 %
HCT: 26.6 % — ABNORMAL LOW (ref 39.0–52.0)
Hemoglobin: 8.8 g/dL — ABNORMAL LOW (ref 13.0–17.0)
Immature Granulocytes: 1 %
Lymphocytes Relative: 12 %
Lymphs Abs: 0.8 10*3/uL (ref 0.7–4.0)
MCH: 32.5 pg (ref 26.0–34.0)
MCHC: 33.1 g/dL (ref 30.0–36.0)
MCV: 98.2 fL (ref 80.0–100.0)
Monocytes Absolute: 0.6 10*3/uL (ref 0.1–1.0)
Monocytes Relative: 9 %
Neutro Abs: 5.1 10*3/uL (ref 1.7–7.7)
Neutrophils Relative %: 76 %
Platelets: 238 10*3/uL (ref 150–400)
RBC: 2.71 MIL/uL — ABNORMAL LOW (ref 4.22–5.81)
RDW: 15.8 % — ABNORMAL HIGH (ref 11.5–15.5)
WBC: 6.7 10*3/uL (ref 4.0–10.5)
nRBC: 0 % (ref 0.0–0.2)

## 2020-12-16 LAB — VITAMIN B12: Vitamin B-12: 436 pg/mL (ref 180–914)

## 2020-12-16 LAB — RETICULOCYTES
Immature Retic Fract: 25.9 % — ABNORMAL HIGH (ref 2.3–15.9)
RBC.: 3.07 MIL/uL — ABNORMAL LOW (ref 4.22–5.81)
Retic Count, Absolute: 192.2 10*3/uL — ABNORMAL HIGH (ref 19.0–186.0)
Retic Ct Pct: 6.3 % — ABNORMAL HIGH (ref 0.4–3.1)

## 2020-12-16 LAB — IRON AND TIBC
Iron: 28 ug/dL — ABNORMAL LOW (ref 45–182)
Saturation Ratios: 12 % — ABNORMAL LOW (ref 17.9–39.5)
TIBC: 238 ug/dL — ABNORMAL LOW (ref 250–450)
UIBC: 210 ug/dL

## 2020-12-16 LAB — PROTIME-INR
INR: 1.7 — ABNORMAL HIGH (ref 0.8–1.2)
Prothrombin Time: 20.1 seconds — ABNORMAL HIGH (ref 11.4–15.2)

## 2020-12-16 LAB — HEPARIN LEVEL (UNFRACTIONATED): Heparin Unfractionated: 0.57 IU/mL (ref 0.30–0.70)

## 2020-12-16 MED ORDER — ENOXAPARIN SODIUM 80 MG/0.8ML IJ SOSY
80.0000 mg | PREFILLED_SYRINGE | Freq: Two times a day (BID) | INTRAMUSCULAR | Status: DC
  Administered 2020-12-16 – 2020-12-17 (×4): 80 mg via SUBCUTANEOUS
  Filled 2020-12-16 (×4): qty 0.8

## 2020-12-16 MED ORDER — WARFARIN SODIUM 7.5 MG PO TABS
7.5000 mg | ORAL_TABLET | Freq: Once | ORAL | Status: AC
  Administered 2020-12-16: 7.5 mg via ORAL
  Filled 2020-12-16: qty 1

## 2020-12-16 MED ORDER — FUROSEMIDE 40 MG PO TABS
40.0000 mg | ORAL_TABLET | Freq: Every morning | ORAL | Status: DC
  Administered 2020-12-16 – 2020-12-18 (×3): 40 mg via ORAL
  Filled 2020-12-16 (×3): qty 1

## 2020-12-16 MED ORDER — DOCUSATE SODIUM 100 MG PO CAPS
100.0000 mg | ORAL_CAPSULE | Freq: Two times a day (BID) | ORAL | Status: DC
  Administered 2020-12-16 – 2020-12-18 (×4): 100 mg via ORAL
  Filled 2020-12-16 (×5): qty 1

## 2020-12-16 MED ORDER — ENSURE ENLIVE PO LIQD: 237.0000 mL | Freq: Two times a day (BID) | ORAL | Status: DC

## 2020-12-16 NOTE — Progress Notes (Signed)
Mobility Specialist Progress Note:   12/16/20 0855  Mobility  Activity Ambulated in hall  Range of Motion/Exercises Active;All extremities  Level of Assistance Standby assist, set-up cues, supervision of patient - no hands on  Assistive Device None  Minutes Ambulated 5 minutes  Distance Ambulated (ft) 360 ft  Mobility Ambulated with assistance in hallway  Mobility Response Tolerated well  Mobility performed by Mobility specialist  Bed Position Chair  Transport method Ambulatory  $Mobility charge 1 Mobility   Pt received in bed and was willing to participate in mobility. Ambulated in hallway at supervision. Pt asx throughout session and was returned to chair with call bell at side and all needs met.   [facility] Health and safety inspector Phone [phone]

## 2020-12-16 NOTE — Plan of Care (Signed)
  Problem: Education: Goal: Knowledge of General Education information will improve Description Including pain rating scale, medication(s)/side effects and non-pharmacologic comfort measures Outcome: Progressing   

## 2020-12-16 NOTE — Progress Notes (Addendum)
ANTICOAGULATION CONSULT NOTE - Follow Up Consult  Pharmacy Consult for heparin and warfarin Indication:  mechanical mitral valve  Allergies  Allergen Reactions   Cardizem [Diltiazem]     Bradycardia per pt Doing well on Amlodipine   Sulfonamide Derivatives     Unknown     Patient Measurements: Height: 5\' 10"  (177.8 cm) Weight: 81.3 kg (179 lb 3.7 oz) IBW/kg (Calculated) : 73 Heparin Dosing Weight: 80 kg  Vital Signs: Temp: 98.2 F (36.8 C) (09/23 0601) Temp Source: Oral (09/23 0601) BP: 131/55 (09/23 0601) Pulse Rate: 66 (09/23 0601)  Labs: Recent Labs    12/14/20 0343 12/14/20 1732 12/15/20 0412 12/15/20 1243 12/16/20 0240  HGB 9.0*  --  9.4*  --  8.8*  HCT 27.0*  --  27.5*  --  26.6*  PLT 229  --  226  --  238  LABPROT 18.5*  --  17.7*  --  20.1*  INR 1.5*  --  1.5*  --  1.7*  HEPARINUNFRC  --  0.20*  --  0.71* 0.57  CREATININE 1.09  --   --   --   --      Estimated Creatinine Clearance: 51.2 mL/min (by C-G formula based on SCr of 1.09 mg/dL).  Medications:  Medications Prior to Admission  Medication Sig Dispense Refill Last Dose   allopurinol (ZYLOPRIM) 300 MG tablet TAKE 1 TABLET BY MOUTH DAILY AS NEEDED. GENERIC EQUIVALENT FOR ZYLOPRIM 90 tablet 3 12/02/2020   amLODipine (NORVASC) 5 MG tablet TAKE 1/2 TABLET BY MOUTH EVERY DAY FOR 3 DAYS,IF BLOOD PRESSURE IS STILL HIGHER THAN 130 THEN INCREASE TO 1 TABLET BY MOUTH DAILY (Patient taking differently: Take 5 mg by mouth daily.) 90 tablet 3 12/02/2020   aspirin 81 MG EC tablet Take 81 mg by mouth daily.   12/02/2020   atorvastatin (LIPITOR) 20 MG tablet TAKE 1 TABLET BY MOUTH TWO TIMES A WEEK (Patient taking differently: Take 20 mg by mouth See admin instructions. TAKE 1 TABLET BY MOUTH TWO TIMES A WEEK) 26 tablet 3 12/02/2020   bisoprolol (ZEBETA) 5 MG tablet Take 0.5 tablets (2.5 mg total) by mouth daily. 45 tablet 3 Past Week   Cholecalciferol (VITAMIN D3) 50 MCG (2000 UT) capsule Take 1 capsule (2,000 Units total)  by mouth daily. 100 capsule 3 12/02/2020   dipyridamole (PERSANTINE) 50 MG tablet Take 1 tablet (50 mg total) by mouth 3 (three) times daily. 270 tablet 1 12/02/2020   enalapril (VASOTEC) 20 MG tablet TAKE 1 TABLET BY MOUTH DAILY. 90 tablet 3 12/02/2020   furosemide (LASIX) 40 MG tablet TAKE 1 TABLET BY MOUTH DAILY GENERIC EQUIVALENT FOR LASIX (Patient taking differently: Take 40 mg by mouth in the morning.) 90 tablet 3 12/02/2020   latanoprost (XALATAN) 0.005 % ophthalmic solution Place 1 drop into both eyes at bedtime.   Past Week   magnesium oxide (MAG-OX) 400 MG tablet TAKE 1 TABLET(400 MG) BY MOUTH TWICE DAILY (Patient taking differently: Take 400 mg by mouth 2 (two) times daily.) 60 tablet 7 12/02/2020   Milk Thistle 1000 MG CAPS Take 1 capsule by mouth daily.   Past Week   pantoprazole (PROTONIX) 40 MG tablet TAKE 1 TABLET BY MOUTH DAILY (Patient taking differently: Take 40 mg by mouth in the morning.) 90 tablet 3 12/02/2020   warfarin (COUMADIN) 5 MG tablet TAKE 1 TABLET BY MOUTH AS DIRECTED BY THE COUMADIN CLINIC (Patient taking differently: Take 5 mg by mouth in the morning. Take 1 tablet by  mouth as directed by the coumadin clinic) 100 tablet 1 12/02/2020   Scheduled:   allopurinol  300 mg Oral Daily   amLODipine  5 mg Oral Daily   atorvastatin  20 mg Oral Once per day on Wed Sat   bisoprolol  2.5 mg Oral Daily   feeding supplement  1 Container Oral TID BM   latanoprost  1 drop Both Eyes QHS   Warfarin - Pharmacist Dosing Inpatient   Does not apply q1600   Infusions:   sodium chloride Stopped (12/15/20 1006)   heparin 1,450 Units/hr (12/15/20 2114)    Assessment: 87 year old male on warfarin at home for mechanical mitral valve. Warfarin held and reversed with vitamin K for appendectomy on 9/12. Patient received 1 unit of blood on 9/18, and anticoagulation has been on hold.  Pharmacy consulted to restart warfarin with heparin bridging. PMH includes history of cirrhosis, which may impact INR.    PTA warfarin 5 mg daily  Heparin level of 0.57 is therapeutic on heparin 1450 units/hr. INR 1.7 increased but is still subtherapeutic. Hgb 8.8. Plt wnl. RN noted some bleeding noted around JP drain but no other signs of bleeding. Patient only eating ~25% of meals  Goal of Therapy:  Heparin level 0.3-0.7 INR 2.5-3.5 Monitor platelets by anticoagulation protocol: Yes   Plan:  Continue heparin 1450 units/hr Give warfarin 7.5mg  PO x 1 today Monitor daily heparin level, INR, CBC, s/s bleeding  Cristela Felt, PharmD, BCPS Clinical Pharmacist 12/16/2020 7:17 AM  Addendum: Discussed with surgery and will change IV heparin to enoxaparin bridge to decrease need for labs associated with IV heparin and need to be on continuous IV. No bleeding around drain noted per Surgery. CrCl >30 ml/hr.   Plan: - Stop heparin and administer first dose of enoxaparin - RN aware - Start enoxaparin 80mg  SQ q12h (1mg /kg q12h) - Continue warfarin plan as stated above

## 2020-12-16 NOTE — Progress Notes (Signed)
OT EVALUATION Pt. Was ed on cross leg technique for LE ADLs and was able to return demo. Pt. Was able to demo safe transfer to walk in shower and high commode which pt. Does have at home. No further OT needed at this time.     12/16/20 1000  OT Visit Information  Last OT Received On 12/16/20  Assistance Needed +1  History of Present Illness Pt is 87 yo male who presented on 12/02/20 with abdominal pain and found to have appendicitis.  He is s/p appendectomy on 12/05/20. Pt with hx of pAF on warfarin, mechanical mitral valve, CKD IIIa, pHTN, HTN, and TIA  Precautions  Precautions None  Precaution Comments JP drain  Restrictions  Weight Bearing Restrictions No  Home Living  Family/patient expects to be discharged to: Private residence  Living Arrangements Spouse/significant other  Available Help at Discharge Family;Available 24 hours/day  Type of Home House  Home Access Level entry  Home Layout Two level;Bed/bath upstairs  Alternate Level Stairs-Number of Steps 14  Alternate Level Stairs-Rails Right;Left  Bathroom Shower/Tub Walk in Research scientist (medical) - single point;Walker - 2 wheels;Shower seat  Additional Comments does not use DME; also has stair lift if needed  Prior Function  Level of Independence Independent  Comments Pt completely independent with driving, IADLs, ADLs, and community ambulation  Communication  Communication HOH  Pain Assessment  Pain Assessment 0-10  Pain Score 2  Pain Location abdomen  Pain Descriptors / Indicators Sore  Pain Intervention(s) Limited activity within patient's tolerance (Pt. did not want pain medicine.)  Cognition  Arousal/Alertness Awake/alert  Behavior During Therapy WFL for tasks assessed/performed  Overall Cognitive Status Within Functional Limits for tasks assessed  Upper Extremity Assessment  Upper Extremity Assessment Overall WFL for tasks assessed  Lower Extremity Assessment  Lower Extremity  Assessment Defer to PT evaluation  Cervical / Trunk Assessment  Cervical / Trunk Assessment Normal  ADL  Overall ADL's  Modified independent  General ADL Comments Pt. ed on cross leg technique for adls and pt. was able to return demo  Vision- History  Baseline Vision/History 1 Wears glasses  Ability to See in Adequate Light 0 Adequate  Patient Visual Report No change from baseline  Vision- Assessment  Vision Assessment? No apparent visual deficits  Transfers  Overall transfer level Independent  Equipment used None  Transfers Stand Pivot Transfers;Sit to/from Stand  Sit to Stand Independent  Stand pivot transfers Independent  General transfer comment Pt. did not have lob with mobiltiy in room  Balance  Sitting balance-Leahy Scale Normal  Standing balance-Leahy Scale Good  OT - End of Session  Activity Tolerance Patient tolerated treatment well  Patient left in chair;with call bell/phone within reach  Nurse Communication  (ok therapy)  OT Assessment  OT Recommendation/Assessment Patient does not need any further OT services  AM-PAC OT "6 Clicks" Daily Activity Outcome Measure (Version 2)  Help from another person eating meals? 4  Help from another person taking care of personal grooming? 4  Help from another person toileting, which includes using toliet, bedpan, or urinal? 4  Help from another person bathing (including washing, rinsing, drying)? 4  Help from another person to put on and taking off regular upper body clothing? 4  Help from another person to put on and taking off regular lower body clothing? 4  6 Click Score 24  Progressive Mobility  What is the highest level of mobility based on the progressive  mobility assessment? Level 6 (Walks independently in room and hall) - Balance while walking in room without assist - Complete  Mobility Ambulated independently in room  OT Recommendation  Follow Up Recommendations No OT follow up  OT Equipment None recommended by OT   Acute Rehab OT Goals  Patient Stated Goal go home  OT Time Calculation  OT Start Time (ACUTE ONLY) 0955  OT Stop Time (ACUTE ONLY) 1025  OT Time Calculation (min) 30 min  OT General Charges  $OT Visit 1 Visit  OT Evaluation  $OT Eval Low Complexity 1 Low  Written Expression  Dominant Hand Right  Reece Packer OT/L

## 2020-12-16 NOTE — Discharge Instructions (Addendum)
[facility]  Please arrive at least 30 min before your appointment to complete your check in paperwork.  If you are unable to arrive 30 min prior to your appointment time we may have to cancel or reschedule you. LAPAROSCOPIC SURGERY: POST OP INSTRUCTIONS Always review your discharge instruction sheet given to you by the facility where your surgery was performed. IF YOU HAVE DISABILITY OR FAMILY LEAVE FORMS, YOU MUST BRING THEM TO THE OFFICE FOR PROCESSING.   DO NOT GIVE THEM TO YOUR DOCTOR.  PAIN CONTROL  First take acetaminophen (Tylenol) AND/or ibuprofen (Advil) to control your pain after surgery.  Follow directions on package.  Taking acetaminophen (Tylenol) and/or ibuprofen (Advil) regularly after surgery will help to control your pain and lower the amount of prescription pain medication you may need.  You should not take more than 4,000 mg (4 grams) of acetaminophen (Tylenol) in 24 hours.  You should not take ibuprofen (Advil), aleve, motrin, naprosyn or other NSAIDS if you have a history of stomach ulcers or chronic kidney disease.  A prescription for pain medication may be given to you upon discharge.  Take your pain medication as prescribed, if you still have uncontrolled pain after taking acetaminophen (Tylenol) or ibuprofen (Advil). Use ice packs to help control pain. If you need a refill on your pain medication, please contact your pharmacy.  They will contact our office to request authorization. Prescriptions will not be filled after 5pm or on week-ends.  HOME MEDICATIONS Take your usually prescribed medications unless otherwise directed.  DIET You should follow a light diet the first few days after arrival home.  Be sure to include lots of fluids daily. Avoid fatty, fried foods.   CONSTIPATION It is common to experience some constipation after surgery and if you are taking pain medication.  Increasing fluid intake and taking a stool softener (such as Colace)  will usually help or prevent this problem from occurring.  A mild laxative (Milk of Magnesia or Miralax) should be taken according to package instructions if there are no bowel movements after 48 hours.  WOUND/INCISION CARE Most patients will experience some swelling and bruising in the area of the incisions.  Ice packs will help.  Swelling and bruising can take several days to resolve.  Unless discharge instructions indicate otherwise, follow guidelines below  STERI-STRIPS - you may remove your outer bandages 48 hours after surgery, and you may shower at that time.  You have steri-strips (small skin tapes) in place directly over the incision.  These strips should be left on the skin for 7-10 days.   DERMABOND/SKIN GLUE - you may shower in 24 hours.  The glue will flake off over the next 2-3 weeks. Any sutures or staples will be removed at the office during your follow-up visit.  ACTIVITIES You may resume regular (light) daily activities beginning the next day--such as daily self-care, walking, climbing stairs--gradually increasing activities as tolerated.  You may have sexual intercourse when it is comfortable.  Refrain from any heavy lifting or straining until approved by your doctor. You may drive when you are no longer taking prescription pain medication, you can comfortably wear a seatbelt, and you can safely maneuver your car and apply brakes.  FOLLOW-UP You should see your doctor in the office for a follow-up appointment approximately 2-3 weeks after your surgery.  You should have been given your post-op/follow-up appointment when your surgery was scheduled.  If you did not receive a post-op/follow-up appointment, make sure   that you call for this appointment within a day or two after you arrive home to insure a convenient appointment time.   WHEN TO CALL YOUR DOCTOR: Fever over 101.0 Inability to urinate Continued bleeding from incision. Increased pain, redness, or drainage from the  incision. Increasing abdominal pain  The clinic staff is available to answer your questions during regular business hours.  Please don't hesitate to call and ask to speak to one of the nurses for clinical concerns.  If you have a medical emergency, go to the nearest emergency room or call 911.  A surgeon from [facility] is always on call at the hospital. [address] [place], [place], [place]  [address] ? [address] [place], [place], [place]   [address] [phone] ? [phone] ? FAX [phone] Please have your INR checked on Tuesday so they can adjust your Coumadin dose.  Your goal INR is 2.5-3.5. Today it was 3.2.

## 2020-12-16 NOTE — Progress Notes (Signed)
PROGRESS NOTE    Anthony Gould   VHQ:[id number]  DOB: 1934-01-18  DOA: 12/02/2020 PCP: Cassandria Anger, MD   Brief Narrative:  Anthony Gould is an 87 year old male with paroxysmal atrial fibrillation, mechanical mitral valve on Coumadin, CKD stage III AAA, hypertension, pulmonary hypertension, cirrhotic changes of the liver noted on imaging with a former history of alcohol use, diabetes mellitus, history of TIA who has been hospitalized for acute appendicitis.  The patient's Coumadin was discontinued, INR was reversed and he underwent appendectomy on 9/12.  Subsequent to this he has had some ongoing mild blood loss and anticoagulation has been on hold.  9/21: Surgery cleared him to restart Coumadin today.  Per surgery drain will be removed tomorrow before discharge. The found him to be on therapeutic INR and make sure that there is no more bleeding.  Continue to have some bleeding secretions at this time.  Subjective: Patient was complaining of worsening lower extremity edema, as home Lasix was on hold.  Denies any shortness of breath.  Continue to have small amount of bloody secretions in the drain.  Assessment & Plan:   Principal Problem:   Acute appendicitis -Status post appendectomy on 9/12 -He completed a course of Unasyn.  Active Problems: Acute blood loss anemia-postop bleed - Heparin started post up, patient noted to bleed (into JP drain) and  Heparin held on 9/14- bleeding resolved - resumed 9/17 without a bolus  - Heparin level was 0.23 at 2108 (on 9/17) and subsequently < 0.10 at 605 but unfortunately, bleeding recurred and Heparin stopped again - Hgb 6.5> transfused  1 U PRBC on 9/16 >  8.0> 7.5> 7.6 - transfused 1 U PRBC 9/18 - 9/19> 620 cc of blood in past 24 hrs - 9/20> 200 cc of serosanguinous fluid 9/21-70 cc of serosanguineous discharge with some mixing of blood.  Coumadin with heparin bridge was restarted after clearance from general surgery. - Hgb  started improving, 9.4 today -Monitor hemoglobin closely. -Transfuse if below 7.   Paroxysmal atrial fibrillation, mechanical mitral valve - Patient was restarting on heparin infusion today along with Coumadin - Continue bisoprolol per home dose (2.5 mg) - high risk for thrombosis-Coumadin was restarted with bridge of heparin.  INR at 1.7, still subtherapeutic  Gout - Continue allopurinol  AKI on CKD stage IIIa - Baseline creatinine appears to range from 1.2 to 1.4 - Creatinine peaked at 1.9 but has subsequently improved and now appears at baseline.  Hypertension with hypertensive heart disease - Continue bisoprolol  - Continue amlodipine. -Holding lisinopril-can resume it if needed -Restarting home dose of Lasix today due to worsening lower extremity edema.    DM2 (?) -He is not on any medication for this at home and prior A1c's have been within the normal range     Time spent in minutes: 34 DVT prophylaxis: Coumadin Code Status: Full code Family Communication: Wife and son were updated at bedside. Level of Care: Level of care: Progressive Disposition Plan:  Status is: Inpatient  Remains inpatient appropriate because:IV treatments appropriate due to intensity of illness or inability to take PO and Inpatient level of care appropriate due to severity of illness  Dispo: The patient is from: Home              Anticipated d/c is to: Home              Patient currently is not medically stable to d/c.   Difficult to place patient No  Consultants:  General surgery Cardiology  Procedures:  Laparoscopic appendectomy 9/12  Antimicrobials:  Anti-infectives (From admission, onward)    Start     Dose/Rate Route Frequency Ordered Stop   12/09/20 1330  vancomycin (VANCOREADY) IVPB 1500 mg/300 mL  Status:  Discontinued        1,500 mg 150 mL/hr over 120 Minutes Intravenous Every 48 hours 12/07/20 1531 12/08/20 0835   12/09/20 0900  Ampicillin-Sulbactam (UNASYN) 3 g in sodium  chloride 0.9 % 100 mL IVPB  Status:  Discontinued        3 g 200 mL/hr over 30 Minutes Intravenous Every 8 hours 12/09/20 0836 12/14/20 1023   12/08/20 1100  vancomycin (VANCOCIN) IVPB 1000 mg/200 mL premix  Status:  Discontinued        1,000 mg 200 mL/hr over 60 Minutes Intravenous Every 24 hours 12/07/20 1033 12/07/20 1531   12/07/20 1900  Ampicillin-Sulbactam (UNASYN) 3 g in sodium chloride 0.9 % 100 mL IVPB  Status:  Discontinued        3 g 200 mL/hr over 30 Minutes Intravenous Every 12 hours 12/07/20 1542 12/09/20 0836   12/07/20 1130  vancomycin (VANCOREADY) IVPB 1500 mg/300 mL        1,500 mg 150 mL/hr over 120 Minutes Intravenous  Once 12/07/20 1031 12/07/20 1618   12/03/20 0200  piperacillin-tazobactam (ZOSYN) IVPB 3.375 g  Status:  Discontinued        3.375 g 12.5 mL/hr over 240 Minutes Intravenous Every 8 hours 12/03/20 0121 12/07/20 1542   12/03/20 0100  piperacillin-tazobactam (ZOSYN) IVPB 3.375 g  Status:  Discontinued        3.375 g 100 mL/hr over 30 Minutes Intravenous  Once 12/03/20 0047 12/03/20 0121        Objective: Vitals:   12/15/20 1948 12/16/20 0010 12/16/20 0601 12/16/20 1251  BP: 121/65  (!) 131/55 (!) 121/54  Pulse: 71  66 72  Resp: 18  20 20   Temp: (!) 97.5 F (36.4 C)  98.2 F (36.8 C) 98.4 F (36.9 C)  TempSrc: Oral  Oral Oral  SpO2: 99%  98% 98%  Weight:  81.3 kg    Height:        Intake/Output Summary (Last 24 hours) at 12/16/2020 1639 Last data filed at 12/16/2020 1300 Gross per 24 hour  Intake 1004.22 ml  Output 585 ml  Net 419.22 ml    Filed Weights   12/14/20 0438 12/15/20 0358 12/16/20 0010  Weight: 80.7 kg 81.8 kg 81.3 kg    Examination: General.  Well-developed elderly gentleman, in no acute distress. Pulmonary.  Lungs clear bilaterally, normal respiratory effort. CV.  Regular rate and rhythm, no JVD, rub or murmur. Abdomen.  Soft, nontender, nondistended, BS positive. CNS.  Alert and oriented .  No focal neurologic  deficit. Extremities.  2+ LE edema, no cyanosis, pulses intact and symmetrical. Psychiatry.  Judgment and insight appears normal.   Data Reviewed: I have personally reviewed following labs and imaging studies  CBC: Recent Labs  Lab 12/12/20 0453 12/13/20 0343 12/14/20 0343 12/15/20 0412 12/16/20 0240  WBC 6.9 8.1 10.5 9.1 6.7  NEUTROABS 5.7 5.8 8.3* 6.9 5.1  HGB 8.9* 9.0* 9.0* 9.4* 8.8*  HCT 26.4* 26.9* 27.0* 27.5* 26.6*  MCV 96.7 97.8 97.5 97.5 98.2  PLT 197 206 229 226 250    Basic Metabolic Panel: Recent Labs  Lab 12/10/20 0434 12/13/20 0343 12/14/20 0343  NA 133* 134* 132*  K 3.9 3.8 3.7  CL 101 102 100  CO2 21* 23 23  GLUCOSE 90 98 99  BUN 21 8 6*  CREATININE 1.33* 1.00 1.09  CALCIUM 8.2* 8.3* 7.9*    GFR: Estimated Creatinine Clearance: 51.2 mL/min (by C-G formula based on SCr of 1.09 mg/dL). Liver Function Tests: No results for input(s): AST, ALT, ALKPHOS, BILITOT, PROT, ALBUMIN in the last 168 hours.  No results for input(s): LIPASE, AMYLASE in the last 168 hours. No results for input(s): AMMONIA in the last 168 hours. Coagulation Profile: Recent Labs  Lab 12/12/20 0453 12/13/20 0343 12/14/20 0343 12/15/20 0412 12/16/20 0240  INR 1.5* 1.5* 1.5* 1.5* 1.7*    Cardiac Enzymes: No results for input(s): CKTOTAL, CKMB, CKMBINDEX, TROPONINI in the last 168 hours. BNP (last 3 results) No results for input(s): PROBNP in the last 8760 hours. HbA1C: No results for input(s): HGBA1C in the last 72 hours. CBG: Recent Labs  Lab 12/10/20 2347 12/11/20 0604 12/11/20 1106 12/12/20 1221 12/12/20 1852  GLUCAP 111* 93 120* 134* 132*    Lipid Profile: No results for input(s): CHOL, HDL, LDLCALC, TRIG, CHOLHDL, LDLDIRECT in the last 72 hours. Thyroid Function Tests: No results for input(s): TSH, T4TOTAL, FREET4, T3FREE, THYROIDAB in the last 72 hours. Anemia Panel: Recent Labs    12/16/20 0909  VITAMINB12 436  FOLATE 8.0  FERRITIN 247  TIBC 238*   IRON 28*  RETICCTPCT 6.3*   Urine analysis:    Component Value Date/Time   COLORURINE YELLOW 12/02/2020 1143   APPEARANCEUR CLEAR 12/02/2020 1143   LABSPEC 1.015 12/02/2020 1143   PHURINE 5.0 12/02/2020 1143   GLUCOSEU NEGATIVE 12/02/2020 1143   HGBUR NEGATIVE 12/02/2020 1143   [place] 12/02/2020 1143   KETONESUR NEGATIVE 12/02/2020 1143   PROTEINUR 30 (A) 08/07/2013 1633   UROBILINOGEN 0.2 12/02/2020 1143   NITRITE NEGATIVE 12/02/2020 1143   LEUKOCYTESUR NEGATIVE 12/02/2020 1143   Sepsis Labs: @LABRCNTIP (procalcitonin:4,lacticidven:4) ) Recent Results (from the past 240 hour(s))  Urine Culture     Status: None   Collection Time: 12/07/20  9:44 AM   Specimen: Urine, Catheterized  Result Value Ref Range Status   Specimen Description URINE, CATHETERIZED  Final   Special Requests NONE  Final   Culture   Final    NO GROWTH Performed at [facility], [place] [address], [place], [place] [address]    Report Status 12/08/2020 FINAL  Final  Culture, blood (x 2)     Status: None   Collection Time: 12/07/20 10:00 AM   Specimen: BLOOD  Result Value Ref Range Status   Specimen Description BLOOD RIGHT ANTECUBITAL  Final   Special Requests   Final    BOTTLES DRAWN AEROBIC AND ANAEROBIC Blood Culture adequate volume   Culture   Final    NO GROWTH 5 DAYS Performed at [facility], [place] [address] [place], [place] [address]    Report Status 12/12/2020 FINAL  Final  Culture, blood (x 2)     Status: None   Collection Time: 12/07/20 10:20 AM   Specimen: BLOOD  Result Value Ref Range Status   Specimen Description BLOOD RIGHT ANTECUBITAL  Final   Special Requests   Final    BOTTLES DRAWN AEROBIC AND ANAEROBIC Blood Culture adequate volume   Culture   Final    NO GROWTH 5 DAYS Performed at [facility], [place] [address], [place], [place] [address]    Report Status 12/12/2020 FINAL  Final     Radiology Studies: No results found.  Scheduled Meds:   allopurinol  300 mg Oral Daily   amLODipine  5 mg Oral Daily   atorvastatin  20 mg Oral Once per day on Wed Sat   bisoprolol  2.5 mg Oral Daily   docusate sodium  100 mg Oral BID   enoxaparin (LOVENOX) injection  80 mg Subcutaneous Q12H   feeding supplement  1 Container Oral TID BM   feeding supplement  237 mL Oral BID BM   furosemide  40 mg Oral q AM   latanoprost  1 drop Both Eyes QHS   Warfarin - Pharmacist Dosing Inpatient   Does not apply q1600   Continuous Infusions:  sodium chloride Stopped (12/15/20 1006)     LOS: 13 days   Lorella Nimrod, MD [facility] Pager: [email or web address] 12/16/2020, 4:39 PM

## 2020-12-16 NOTE — Progress Notes (Signed)
11 Days Post-Op  Subjective: CC: Doing well. No abdominal pain, n/v. Tolerating diet and eating 1/4-1/2 of his trays. BM yesterday per patient. Voiding. Reports he is mobilizing well in the halls (x4 yesterday). Drain w/ 35cc/24 hours and hgb overall stable. Some report on notes of leaking around drain yesterday but none noted today.   Objective: Vital signs in last 24 hours: Temp:  [97.5 F (36.4 C)-98.2 F (36.8 C)] 98.2 F (36.8 C) (09/23 0601) Pulse Rate:  [66-77] 66 (09/23 0601) Resp:  [18-20] 20 (09/23 0601) BP: (121-131)/(55-65) 131/55 (09/23 0601) SpO2:  [96 %-99 %] 98 % (09/23 0601) Weight:  [81.3 kg] 81.3 kg (09/23 0010) Last BM Date: 12/14/20  Intake/Output from previous day: 09/22 0701 - 09/23 0700 In: 1324.2 [P.O.:800; I.V.:524.2] Out: 685 [Urine:650; Drains:35] Intake/Output this shift: No intake/output data recorded.  PE: Gen:  Alert, NAD, pleasant Pulm:  rate and effort normal Abd: Soft, nondistended, NT, incisions cdi with steri strips in place (some bruising around umbilical incision), JP drain with scant ss output (35cc/24 hours) Psych: A&Ox3    Lab Results:  Recent Labs    12/15/20 0412 12/16/20 0240  WBC 9.1 6.7  HGB 9.4* 8.8*  HCT 27.5* 26.6*  PLT 226 238   BMET Recent Labs    12/14/20 0343  NA 132*  K 3.7  CL 100  CO2 23  GLUCOSE 99  BUN 6*  CREATININE 1.09  CALCIUM 7.9*   PT/INR Recent Labs    12/15/20 0412 12/16/20 0240  LABPROT 17.7* 20.1*  INR 1.5* 1.7*   CMP     Component Value Date/Time   NA 132 (L) 12/14/2020 0343   K 3.7 12/14/2020 0343   CL 100 12/14/2020 0343   CO2 23 12/14/2020 0343   GLUCOSE 99 12/14/2020 0343   BUN 6 (L) 12/14/2020 0343   CREATININE 1.09 12/14/2020 0343   CREATININE 1.40 (H) 04/01/2015 1540   CALCIUM 7.9 (L) 12/14/2020 0343   PROT 5.6 (L) 12/07/2020 1001   ALBUMIN 2.9 (L) 12/07/2020 1001   AST 31 12/07/2020 1001   ALT 21 12/07/2020 1001   ALKPHOS 61 12/07/2020 1001   BILITOT 1.7  (H) 12/07/2020 1001   GFRNONAA >60 12/14/2020 0343   GFRAA 107 11/17/2007 1231   Lipase     Component Value Date/Time   LIPASE 15.0 12/18/2017 0828    Studies/Results: No results found.  Anti-infectives: Anti-infectives (From admission, onward)    Start     Dose/Rate Route Frequency Ordered Stop   12/09/20 1330  vancomycin (VANCOREADY) IVPB 1500 mg/300 mL  Status:  Discontinued        1,500 mg 150 mL/hr over 120 Minutes Intravenous Every 48 hours 12/07/20 1531 12/08/20 0835   12/09/20 0900  Ampicillin-Sulbactam (UNASYN) 3 g in sodium chloride 0.9 % 100 mL IVPB  Status:  Discontinued        3 g 200 mL/hr over 30 Minutes Intravenous Every 8 hours 12/09/20 0836 12/14/20 1023   12/08/20 1100  vancomycin (VANCOCIN) IVPB 1000 mg/200 mL premix  Status:  Discontinued        1,000 mg 200 mL/hr over 60 Minutes Intravenous Every 24 hours 12/07/20 1033 12/07/20 1531   12/07/20 1900  Ampicillin-Sulbactam (UNASYN) 3 g in sodium chloride 0.9 % 100 mL IVPB  Status:  Discontinued        3 g 200 mL/hr over 30 Minutes Intravenous Every 12 hours 12/07/20 1542 12/09/20 0836   12/07/20 1130  vancomycin (VANCOREADY)  IVPB 1500 mg/300 mL        1,500 mg 150 mL/hr over 120 Minutes Intravenous  Once 12/07/20 1031 12/07/20 1618   12/03/20 0200  piperacillin-tazobactam (ZOSYN) IVPB 3.375 g  Status:  Discontinued        3.375 g 12.5 mL/hr over 240 Minutes Intravenous Every 8 hours 12/03/20 0121 12/07/20 1542   12/03/20 0100  piperacillin-tazobactam (ZOSYN) IVPB 3.375 g  Status:  Discontinued        3.375 g 100 mL/hr over 30 Minutes Intravenous  Once 12/03/20 0047 12/03/20 0121        Assessment/Plan POD#11 s/p Laparoscopic appendectomy for Acute appendicitis 9/12 Dr. Barry Dienes  - Appendiceal orifice was sutured with a pursestring 2-0 vicryl using an Endostitch, drain was placed adjacent to the cecum - Likely bled from this suture line  - Heparin > Coumadin restarted. Drain output remains stable and hgb  overall stable (9.4 > 8.8). They are transitioning to Coumadin  - Continue JP drain and monitor output. Once at INR goal, if no issues would plan to remove drain prior to d/c - Comp abx - Mobilize - Pulm toilet   ID - zosyn 9/10>>9/14, vancomycin 9/14>>9/15, unasyn 9/14 - 9/21 FEN - IVF per trh, heart healthy diet, add ensure  VTE - IV heparin > Coumadin  Foley - none   Mechanical mitral valve replacement on coumadin at home Permanent A Fib HTN CAD AKI on CKD IIIa - Cr 1.91 > 1.65 > 1.33 >> 1.09 CHF Cirrhosis   LOS: 13 days    Jillyn Ledger , [facility] 12/16/2020, 8:45 AM Please see Amion for pager number during day hours 7:00am-4:30pm

## 2020-12-17 LAB — BASIC METABOLIC PANEL
Anion gap: 7 (ref 5–15)
BUN: 7 mg/dL — ABNORMAL LOW (ref 8–23)
CO2: 25 mmol/L (ref 22–32)
Calcium: 8.1 mg/dL — ABNORMAL LOW (ref 8.9–10.3)
Chloride: 100 mmol/L (ref 98–111)
Creatinine, Ser: 0.96 mg/dL (ref 0.61–1.24)
GFR, Estimated: >60 mL/min (ref >60)
Glucose, Bld: 93 mg/dL (ref 70–99)
Potassium: 3.7 mmol/L (ref 3.5–5.1)
Sodium: 132 mmol/L — ABNORMAL LOW (ref 135–145)

## 2020-12-17 LAB — CBC WITH DIFFERENTIAL/PLATELET
Abs Immature Granulocytes: 0.05 10*3/uL (ref 0.00–0.07)
Basophils Absolute: 0 10*3/uL (ref 0.0–0.1)
Basophils Relative: 1 %
Eosinophils Absolute: 0.1 10*3/uL (ref 0.0–0.5)
Eosinophils Relative: 1 %
HCT: 27.3 % — ABNORMAL LOW (ref 39.0–52.0)
Hemoglobin: 9 g/dL — ABNORMAL LOW (ref 13.0–17.0)
Immature Granulocytes: 1 %
Lymphocytes Relative: 13 %
Lymphs Abs: 0.9 10*3/uL (ref 0.7–4.0)
MCH: 32 pg (ref 26.0–34.0)
MCHC: 33 g/dL (ref 30.0–36.0)
MCV: 97.2 fL (ref 80.0–100.0)
Monocytes Absolute: 0.7 10*3/uL (ref 0.1–1.0)
Monocytes Relative: 10 %
Neutro Abs: 5.2 10*3/uL (ref 1.7–7.7)
Neutrophils Relative %: 74 %
Platelets: 258 10*3/uL (ref 150–400)
RBC: 2.81 MIL/uL — ABNORMAL LOW (ref 4.22–5.81)
RDW: 15.5 % (ref 11.5–15.5)
WBC: 6.9 10*3/uL (ref 4.0–10.5)
nRBC: 0 % (ref 0.0–0.2)

## 2020-12-17 LAB — PROTIME-INR
INR: 2.1 — ABNORMAL HIGH (ref 0.8–1.2)
Prothrombin Time: 23.9 seconds — ABNORMAL HIGH (ref 11.4–15.2)

## 2020-12-17 MED ORDER — WARFARIN SODIUM 7.5 MG PO TABS
7.5000 mg | ORAL_TABLET | ORAL | Status: AC
  Administered 2020-12-17: 7.5 mg via ORAL
  Filled 2020-12-17: qty 1

## 2020-12-17 MED ORDER — POLYETHYLENE GLYCOL 3350 17 G PO PACK
17.0000 g | PACK | Freq: Every day | ORAL | Status: DC
  Administered 2020-12-17: 17 g via ORAL
  Filled 2020-12-17 (×2): qty 1

## 2020-12-17 MED ORDER — FE FUMARATE-B12-VIT C-FA-IFC PO CAPS
1.0000 | ORAL_CAPSULE | Freq: Two times a day (BID) | ORAL | Status: DC
  Administered 2020-12-17 – 2020-12-18 (×3): 1 via ORAL
  Filled 2020-12-17 (×3): qty 1

## 2020-12-17 NOTE — Progress Notes (Signed)
PROGRESS NOTE    Anthony Gould   [id number]  DOB: [birth year]  DOA: 12/02/2020 PCP: Cassandria Anger, MD   Brief Narrative:  Anthony Gould is an 87 year old male with paroxysmal atrial fibrillation, mechanical mitral valve on Coumadin, CKD stage III AAA, hypertension, pulmonary hypertension, cirrhotic changes of the liver noted on imaging with a former history of alcohol use, diabetes mellitus, history of TIA who has been hospitalized for acute appendicitis.  The patient's Coumadin was discontinued, INR was reversed and he underwent appendectomy on 9/12.  Subsequent to this he has had some ongoing mild blood loss and anticoagulation has been on hold.  9/21: Surgery cleared him to restart Coumadin today.  9/24: Not sure when surgery is planning to take the dressing off, INR still subtherapeutic at 2.1 today, continue to have some bloody secretions in the drain.  Subjective: Patient was sitting comfortably when seen today.  No new complaints.  Lower extremity edema improving, making good amount of urine.  Assessment & Plan:   Principal Problem:   Acute appendicitis -Status post appendectomy on 9/12 -He completed a course of Unasyn.  Active Problems: Acute blood loss anemia-postop bleed - Heparin started post up, patient noted to bleed (into JP drain) and  Heparin held on 9/14- bleeding resolved - resumed 9/17 without a bolus  - Heparin level was 0.23 at 2108 (on 9/17) and subsequently < 0.10 at 605 but unfortunately, bleeding recurred and Heparin stopped again - Hgb 6.5> transfused  1 U PRBC on 9/16 >  8.0> 7.5> 7.6 - transfused 1 U PRBC 9/18 - 9/19> 620 cc of blood in past 24 hrs - 9/20> 200 cc of serosanguinous fluid 9/21-70 cc of serosanguineous discharge with some mixing of blood.  Coumadin with heparin bridge was restarted after clearance from general surgery. -Hemoglobin seems stable.  9.0 today -Monitor hemoglobin closely. -Transfuse if below 7.   Paroxysmal  atrial fibrillation, mechanical mitral valve - Patient was restarting on heparin along with Coumadin - Continue bisoprolol per home dose (2.5 mg) - high risk for thrombosis-Coumadin was restarted with bridge of heparin.  INR at 2.1, still subtherapeutic, goal INR of 2.5-3.5  Gout - Continue allopurinol  AKI on CKD stage IIIa - Baseline creatinine appears to range from 1.2 to 1.4 - Creatinine peaked at 1.9 but has subsequently improved and now appears at baseline.  Hypertension with hypertensive heart disease - Continue bisoprolol  - Continue amlodipine. -Holding lisinopril-can resume it if needed -Restarting home dose of Lasix today due to worsening lower extremity edema.    DM2 (?) -He is not on any medication for this at home and prior A1c's have been within the normal range     Time spent in minutes: 30 DVT prophylaxis: Coumadin Code Status: Full code Family Communication:  Level of Care:  MedSurg Disposition Plan:  Status is: Inpatient  Remains inpatient appropriate because:IV treatments appropriate due to intensity of illness or inability to take PO and Inpatient level of care appropriate due to severity of illness  Dispo: The patient is from: Home              Anticipated d/c is to: Home              Patient currently is not medically stable to d/c.   Difficult to place patient No  Consultants:  General surgery Cardiology  Procedures:  Laparoscopic appendectomy 9/12  Antimicrobials:  Anti-infectives (From admission, onward)    Start     Dose/Rate  Route Frequency Ordered Stop   12/09/20 1330  vancomycin (VANCOREADY) IVPB 1500 mg/300 mL  Status:  Discontinued        1,500 mg 150 mL/hr over 120 Minutes Intravenous Every 48 hours 12/07/20 1531 12/08/20 0835   12/09/20 0900  Ampicillin-Sulbactam (UNASYN) 3 g in sodium chloride 0.9 % 100 mL IVPB  Status:  Discontinued        3 g 200 mL/hr over 30 Minutes Intravenous Every 8 hours 12/09/20 0836 12/14/20 1023    12/08/20 1100  vancomycin (VANCOCIN) IVPB 1000 mg/200 mL premix  Status:  Discontinued        1,000 mg 200 mL/hr over 60 Minutes Intravenous Every 24 hours 12/07/20 1033 12/07/20 1531   12/07/20 1900  Ampicillin-Sulbactam (UNASYN) 3 g in sodium chloride 0.9 % 100 mL IVPB  Status:  Discontinued        3 g 200 mL/hr over 30 Minutes Intravenous Every 12 hours 12/07/20 1542 12/09/20 0836   12/07/20 1130  vancomycin (VANCOREADY) IVPB 1500 mg/300 mL        1,500 mg 150 mL/hr over 120 Minutes Intravenous  Once 12/07/20 1031 12/07/20 1618   12/03/20 0200  piperacillin-tazobactam (ZOSYN) IVPB 3.375 g  Status:  Discontinued        3.375 g 12.5 mL/hr over 240 Minutes Intravenous Every 8 hours 12/03/20 0121 12/07/20 1542   12/03/20 0100  piperacillin-tazobactam (ZOSYN) IVPB 3.375 g  Status:  Discontinued        3.375 g 100 mL/hr over 30 Minutes Intravenous  Once 12/03/20 0047 12/03/20 0121        Objective: Vitals:   12/17/20 0120 12/17/20 0416 12/17/20 0807 12/17/20 1140  BP: (!) 131/53 (!) 130/59 (!) 135/57 (!) 137/56  Pulse: 70 71 80 71  Resp: 14 14 20    Temp: 98.1 F (36.7 C) 98.1 F (36.7 C) 98 F (36.7 C) 98 F (36.7 C)  TempSrc: Oral Oral Oral Oral  SpO2: 94% 96% 97% 97%  Weight: 80.5 kg     Height:        Intake/Output Summary (Last 24 hours) at 12/17/2020 1545 Last data filed at 12/17/2020 1500 Gross per 24 hour  Intake 480 ml  Output 830 ml  Net -350 ml    Filed Weights   12/15/20 0358 12/16/20 0010 12/17/20 0120  Weight: 81.8 kg 81.3 kg 80.5 kg    Examination: General.  Well-developed elderly man, in no acute distress. Pulmonary.  Lungs clear bilaterally, normal respiratory effort. CV.  Regular rate and rhythm, no JVD, rub or murmur. Abdomen.  Soft, nontender, nondistended, BS positive. CNS.  Alert and oriented .  No focal neurologic deficit. Extremities.  2+ LE edema, no cyanosis, pulses intact and symmetrical. Psychiatry.  Judgment and insight appears normal.    Data Reviewed: I have personally reviewed following labs and imaging studies  CBC: Recent Labs  Lab 12/13/20 0343 12/14/20 0343 12/15/20 0412 12/16/20 0240 12/17/20 0351  WBC 8.1 10.5 9.1 6.7 6.9  NEUTROABS 5.8 8.3* 6.9 5.1 5.2  HGB 9.0* 9.0* 9.4* 8.8* 9.0*  HCT 26.9* 27.0* 27.5* 26.6* 27.3*  MCV 97.8 97.5 97.5 98.2 97.2  PLT 206 229 226 238 629    Basic Metabolic Panel: Recent Labs  Lab 12/13/20 0343 12/14/20 0343 12/17/20 0351  NA 134* 132* 132*  K 3.8 3.7 3.7  CL 102 100 100  CO2 23 23 25   GLUCOSE 98 99 93  BUN 8 6* 7*  CREATININE 1.00 1.09 0.96  CALCIUM  8.3* 7.9* 8.1*    GFR: Estimated Creatinine Clearance: 58.1 mL/min (by C-G formula based on SCr of 0.96 mg/dL). Liver Function Tests: No results for input(s): AST, ALT, ALKPHOS, BILITOT, PROT, ALBUMIN in the last 168 hours.  No results for input(s): LIPASE, AMYLASE in the last 168 hours. No results for input(s): AMMONIA in the last 168 hours. Coagulation Profile: Recent Labs  Lab 12/13/20 0343 12/14/20 0343 12/15/20 0412 12/16/20 0240 12/17/20 0351  INR 1.5* 1.5* 1.5* 1.7* 2.1*    Cardiac Enzymes: No results for input(s): CKTOTAL, CKMB, CKMBINDEX, TROPONINI in the last 168 hours. BNP (last 3 results) No results for input(s): PROBNP in the last 8760 hours. HbA1C: No results for input(s): HGBA1C in the last 72 hours. CBG: Recent Labs  Lab 12/10/20 2347 12/11/20 0604 12/11/20 1106 12/12/20 1221 12/12/20 1852  GLUCAP 111* 93 120* 134* 132*    Lipid Profile: No results for input(s): CHOL, HDL, LDLCALC, TRIG, CHOLHDL, LDLDIRECT in the last 72 hours. Thyroid Function Tests: No results for input(s): TSH, T4TOTAL, FREET4, T3FREE, THYROIDAB in the last 72 hours. Anemia Panel: Recent Labs    12/16/20 0909  VITAMINB12 436  FOLATE 8.0  FERRITIN 247  TIBC 238*  IRON 28*  RETICCTPCT 6.3*    Urine analysis:    Component Value Date/Time   COLORURINE YELLOW 12/02/2020 [place] 12/02/2020 1143   LABSPEC 1.015 12/02/2020 1143   PHURINE 5.0 12/02/2020 1143   GLUCOSEU NEGATIVE 12/02/2020 1143   HGBUR NEGATIVE 12/02/2020 1143   BILIRUBINUR NEGATIVE 12/02/2020 1143   KETONESUR NEGATIVE 12/02/2020 1143   PROTEINUR 30 (A) 08/07/2013 1633   UROBILINOGEN 0.2 12/02/2020 1143   NITRITE NEGATIVE 12/02/2020 1143   LEUKOCYTESUR NEGATIVE 12/02/2020 1143   Sepsis Labs: @LABRCNTIP (procalcitonin:4,lacticidven:4) ) No results found for this or any previous visit (from the past 240 hour(s)).    Radiology Studies: No results found.   Scheduled Meds:  allopurinol  300 mg Oral Daily   amLODipine  5 mg Oral Daily   atorvastatin  20 mg Oral Once per day on Wed Sat   bisoprolol  2.5 mg Oral Daily   docusate sodium  100 mg Oral BID   enoxaparin (LOVENOX) injection  80 mg Subcutaneous Q12H   feeding supplement  1 Container Oral TID BM   feeding supplement  237 mL Oral BID BM   ferrous [id number] C-folic acid  1 capsule Oral BID PC   furosemide  40 mg Oral q AM   latanoprost  1 drop Both Eyes QHS   polyethylene glycol  17 g Oral Daily   Warfarin - Pharmacist Dosing Inpatient   Does not apply q1600   Continuous Infusions:  sodium chloride Stopped (12/15/20 1006)     LOS: 14 days   Lorella Nimrod, MD [facility] Pager: [email or web address] 12/17/2020, 3:45 PM

## 2020-12-17 NOTE — Progress Notes (Signed)
12 Days Post-Op  Subjective: CC: Doing well. Some soreness of his RLQ that increased after ambulating yesterday but improved this morning to almost being asymptomatic. Tolerating diet and finished almost all of his tray this am without n/v. Passing flatus. Last BM 9/22. Mobilizing well.   Objective: Vital signs in last 24 hours: Temp:  [98.1 F (36.7 C)-98.4 F (36.9 C)] 98.1 F (36.7 C) (09/24 0416) Pulse Rate:  [70-80] 80 (09/24 0807) Resp:  [14-20] 20 (09/24 0807) BP: (121-141)/(53-59) 135/57 (09/24 0807) SpO2:  [94 %-99 %] 97 % (09/24 0807) Weight:  [80.5 kg] 80.5 kg (09/24 0120) Last BM Date: 12/15/20  Intake/Output from previous day: 09/23 0701 - 09/24 0700 In: 360 [P.O.:360] Out: 480 [Urine:475; Drains:5] Intake/Output this shift: Total I/O In: 120 [P.O.:120] Out: 300 [Urine:300]  PE: Gen:  Alert, NAD, pleasant Pulm:  rate and effort normal Abd: Soft, nondistended, very mild rlq tenderness without peritonitis and otherwise nt, incisions cdi with steri strips in place (some bruising around umbilical incision), JP drain with what looks like old dark hematoma (5cc/24 hours) Psych: A&Ox3  Lab Results:  Recent Labs    12/16/20 0240 12/17/20 0351  WBC 6.7 6.9  HGB 8.8* 9.0*  HCT 26.6* 27.3*  PLT 238 258   BMET Recent Labs    12/17/20 0351  NA 132*  K 3.7  CL 100  CO2 25  GLUCOSE 93  BUN 7*  CREATININE 0.96  CALCIUM 8.1*   PT/INR Recent Labs    12/16/20 0240 12/17/20 0351  LABPROT 20.1* 23.9*  INR 1.7* 2.1*   CMP     Component Value Date/Time   NA 132 (L) 12/17/2020 0351   K 3.7 12/17/2020 0351   CL 100 12/17/2020 0351   CO2 25 12/17/2020 0351   GLUCOSE 93 12/17/2020 0351   BUN 7 (L) 12/17/2020 0351   CREATININE 0.96 12/17/2020 0351   CREATININE 1.40 (H) 04/01/2015 1540   CALCIUM 8.1 (L) 12/17/2020 0351   PROT 5.6 (L) 12/07/2020 1001   ALBUMIN 2.9 (L) 12/07/2020 1001   AST 31 12/07/2020 1001   ALT 21 12/07/2020 1001   ALKPHOS 61  12/07/2020 1001   BILITOT 1.7 (H) 12/07/2020 1001   GFRNONAA >60 12/17/2020 0351   GFRAA 107 11/17/2007 1231   Lipase     Component Value Date/Time   LIPASE 15.0 12/18/2017 0828    Studies/Results: No results found.  Anti-infectives: Anti-infectives (From admission, onward)    Start     Dose/Rate Route Frequency Ordered Stop   12/09/20 1330  vancomycin (VANCOREADY) IVPB 1500 mg/300 mL  Status:  Discontinued        1,500 mg 150 mL/hr over 120 Minutes Intravenous Every 48 hours 12/07/20 1531 12/08/20 0835   12/09/20 0900  Ampicillin-Sulbactam (UNASYN) 3 g in sodium chloride 0.9 % 100 mL IVPB  Status:  Discontinued        3 g 200 mL/hr over 30 Minutes Intravenous Every 8 hours 12/09/20 0836 12/14/20 1023   12/08/20 1100  vancomycin (VANCOCIN) IVPB 1000 mg/200 mL premix  Status:  Discontinued        1,000 mg 200 mL/hr over 60 Minutes Intravenous Every 24 hours 12/07/20 1033 12/07/20 1531   12/07/20 1900  Ampicillin-Sulbactam (UNASYN) 3 g in sodium chloride 0.9 % 100 mL IVPB  Status:  Discontinued        3 g 200 mL/hr over 30 Minutes Intravenous Every 12 hours 12/07/20 1542 12/09/20 0836   12/07/20 1130  vancomycin (VANCOREADY) IVPB 1500 mg/300 mL        1,500 mg 150 mL/hr over 120 Minutes Intravenous  Once 12/07/20 1031 12/07/20 1618   12/03/20 0200  piperacillin-tazobactam (ZOSYN) IVPB 3.375 g  Status:  Discontinued        3.375 g 12.5 mL/hr over 240 Minutes Intravenous Every 8 hours 12/03/20 0121 12/07/20 1542   12/03/20 0100  piperacillin-tazobactam (ZOSYN) IVPB 3.375 g  Status:  Discontinued        3.375 g 100 mL/hr over 30 Minutes Intravenous  Once 12/03/20 0047 12/03/20 0121        Assessment/Plan POD#12 s/p Laparoscopic appendectomy for Acute appendicitis 9/12 Dr. Barry Dienes  - Appendiceal orifice was sutured with a pursestring 2-0 vicryl using an Endostitch, drain was placed adjacent to the cecum - Likely bled from this suture line  - Bridging lovenox to Coumadin  restarted. Drain output remains low and hgb stable.  - Continue JP drain and monitor output. Will discuss with md timing of d/c - Comp abx - Mobilize - Pulm toilet   ID - zosyn 9/10>>9/14, vancomycin 9/14>>9/15, unasyn 9/14 - 9/21 FEN - IVF per trh, heart healthy diet, ensure  VTE - IV heparin > Coumadin  Foley - none   Mechanical mitral valve replacement on coumadin at home Permanent A Fib HTN CAD AKI on CKD IIIa - resolved CHF Cirrhosis   LOS: 14 days    Anthony Gould , [facility] 12/17/2020, 8:26 AM Please see Amion for pager number during day hours 7:00am-4:30pm

## 2020-12-17 NOTE — Progress Notes (Addendum)
ANTICOAGULATION CONSULT NOTE - Follow Up Consult  Pharmacy Consult for heparin and warfarin Indication:  mechanical mitral valve  Allergies  Allergen Reactions   Cardizem [Diltiazem]     Bradycardia per pt Doing well on Amlodipine   Sulfonamide Derivatives     Unknown     Patient Measurements: Height: 5\' 10"  (177.8 cm) Weight: 80.5 kg (177 lb 6.4 oz) IBW/kg (Calculated) : 73 Heparin Dosing Weight: 80 kg  Vital Signs: Temp: 98 F (36.7 C) (09/24 1140) Temp Source: Oral (09/24 1140) BP: 137/56 (09/24 1140) Pulse Rate: 71 (09/24 1140)  Labs: Recent Labs    12/14/20 1732 12/15/20 0412 12/15/20 0412 12/15/20 1243 12/16/20 0240 12/17/20 0351  HGB  --  9.4*   < >  --  8.8* 9.0*  HCT  --  27.5*  --   --  26.6* 27.3*  PLT  --  226  --   --  238 258  LABPROT  --  17.7*  --   --  20.1* 23.9*  INR  --  1.5*  --   --  1.7* 2.1*  HEPARINUNFRC 0.20*  --   --  0.71* 0.57  --   CREATININE  --   --   --   --   --  0.96   < > = values in this interval not displayed.     Estimated Creatinine Clearance: 58.1 mL/min (by C-G formula based on SCr of 0.96 mg/dL).  Medications:  Medications Prior to Admission  Medication Sig Dispense Refill Last Dose   allopurinol (ZYLOPRIM) 300 MG tablet TAKE 1 TABLET BY MOUTH DAILY AS NEEDED. GENERIC EQUIVALENT FOR ZYLOPRIM 90 tablet 3 12/02/2020   amLODipine (NORVASC) 5 MG tablet TAKE 1/2 TABLET BY MOUTH EVERY DAY FOR 3 DAYS,IF BLOOD PRESSURE IS STILL HIGHER THAN 130 THEN INCREASE TO 1 TABLET BY MOUTH DAILY (Patient taking differently: Take 5 mg by mouth daily.) 90 tablet 3 12/02/2020   aspirin 81 MG EC tablet Take 81 mg by mouth daily.   12/02/2020   atorvastatin (LIPITOR) 20 MG tablet TAKE 1 TABLET BY MOUTH TWO TIMES A WEEK (Patient taking differently: Take 20 mg by mouth See admin instructions. TAKE 1 TABLET BY MOUTH TWO TIMES A WEEK) 26 tablet 3 12/02/2020   bisoprolol (ZEBETA) 5 MG tablet Take 0.5 tablets (2.5 mg total) by mouth daily. 45 tablet 3 Past  Week   Cholecalciferol (VITAMIN D3) 50 MCG (2000 UT) capsule Take 1 capsule (2,000 Units total) by mouth daily. 100 capsule 3 12/02/2020   dipyridamole (PERSANTINE) 50 MG tablet Take 1 tablet (50 mg total) by mouth 3 (three) times daily. 270 tablet 1 12/02/2020   enalapril (VASOTEC) 20 MG tablet TAKE 1 TABLET BY MOUTH DAILY. 90 tablet 3 12/02/2020   furosemide (LASIX) 40 MG tablet TAKE 1 TABLET BY MOUTH DAILY GENERIC EQUIVALENT FOR LASIX (Patient taking differently: Take 40 mg by mouth in the morning.) 90 tablet 3 12/02/2020   latanoprost (XALATAN) 0.005 % ophthalmic solution Place 1 drop into both eyes at bedtime.   Past Week   magnesium oxide (MAG-OX) 400 MG tablet TAKE 1 TABLET(400 MG) BY MOUTH TWICE DAILY (Patient taking differently: Take 400 mg by mouth 2 (two) times daily.) 60 tablet 7 12/02/2020   Milk Thistle 1000 MG CAPS Take 1 capsule by mouth daily.   Past Week   pantoprazole (PROTONIX) 40 MG tablet TAKE 1 TABLET BY MOUTH DAILY (Patient taking differently: Take 40 mg by mouth in the morning.) 90 tablet  3 12/02/2020   warfarin (COUMADIN) 5 MG tablet TAKE 1 TABLET BY MOUTH AS DIRECTED BY THE COUMADIN CLINIC (Patient taking differently: Take 5 mg by mouth in the morning. Take 1 tablet by mouth as directed by the coumadin clinic) 100 tablet 1 12/02/2020   Scheduled:   allopurinol  300 mg Oral Daily   amLODipine  5 mg Oral Daily   atorvastatin  20 mg Oral Once per day on Wed Sat   bisoprolol  2.5 mg Oral Daily   docusate sodium  100 mg Oral BID   enoxaparin (LOVENOX) injection  80 mg Subcutaneous Q12H   feeding supplement  1 Container Oral TID BM   feeding supplement  237 mL Oral BID BM   ferrous [id number] C-folic acid  1 capsule Oral BID PC   furosemide  40 mg Oral q AM   latanoprost  1 drop Both Eyes QHS   polyethylene glycol  17 g Oral Daily   Warfarin - Pharmacist Dosing Inpatient   Does not apply q1600   Infusions:   sodium chloride Stopped (12/15/20 1006)    Assessment: 87  year old male on warfarin at home for mechanical mitral valve. Warfarin held and reversed with vitamin K for appendectomy on 9/12. Patient received 1 unit of blood on 9/18, and anticoagulation was held.  Pharmacy consulted to restart warfarin with enoxaparin bridging. PMH includes history of cirrhosis, which may impact INR.   PTA warfarin 5 mg daily  INR 2.1 is still subtherapeutic after 7.5 mg x4 days. Hgb is up at 9. Plt wnl.  Goal of Therapy:  INR 2.5-3.5 Monitor platelets by anticoagulation protocol: Yes   Plan:  Continue enoxaparin 80 mg SQ twice daily until INR therapeutic  Give warfarin 7.5mg  PO x 1 today Monitor daily INR, CBC, s/s bleeding  Laurey Arrow, PharmD PGY1 Pharmacy Resident 12/17/2020  5:08 PM  Please check [email or web address] for unit-specific pharmacy phone numbers.

## 2020-12-18 DIAGNOSIS — E1159 Type 2 diabetes mellitus with other circulatory complications: Secondary | ICD-10-CM

## 2020-12-18 LAB — BASIC METABOLIC PANEL
Anion gap: 8 (ref 5–15)
BUN: 8 mg/dL (ref 8–23)
CO2: 25 mmol/L (ref 22–32)
Calcium: 8.2 mg/dL — ABNORMAL LOW (ref 8.9–10.3)
Chloride: 100 mmol/L (ref 98–111)
Creatinine, Ser: 0.93 mg/dL (ref 0.61–1.24)
GFR, Estimated: >60 mL/min (ref >60)
Glucose, Bld: 104 mg/dL — ABNORMAL HIGH (ref 70–99)
Potassium: 3.7 mmol/L (ref 3.5–5.1)
Sodium: 133 mmol/L — ABNORMAL LOW (ref 135–145)

## 2020-12-18 LAB — CBC WITH DIFFERENTIAL/PLATELET
Abs Immature Granulocytes: 0.03 10*3/uL (ref 0.00–0.07)
Basophils Absolute: 0 10*3/uL (ref 0.0–0.1)
Basophils Relative: 1 %
Eosinophils Absolute: 0.1 10*3/uL (ref 0.0–0.5)
Eosinophils Relative: 2 %
HCT: 27.3 % — ABNORMAL LOW (ref 39.0–52.0)
Hemoglobin: 9.2 g/dL — ABNORMAL LOW (ref 13.0–17.0)
Immature Granulocytes: 1 %
Lymphocytes Relative: 15 %
Lymphs Abs: 0.9 10*3/uL (ref 0.7–4.0)
MCH: 32.6 pg (ref 26.0–34.0)
MCHC: 33.7 g/dL (ref 30.0–36.0)
MCV: 96.8 fL (ref 80.0–100.0)
Monocytes Absolute: 0.6 10*3/uL (ref 0.1–1.0)
Monocytes Relative: 11 %
Neutro Abs: 4.1 10*3/uL (ref 1.7–7.7)
Neutrophils Relative %: 70 %
Platelets: 226 10*3/uL (ref 150–400)
RBC: 2.82 MIL/uL — ABNORMAL LOW (ref 4.22–5.81)
RDW: 15.4 % (ref 11.5–15.5)
WBC: 5.7 10*3/uL (ref 4.0–10.5)
nRBC: 0 % (ref 0.0–0.2)

## 2020-12-18 LAB — PROTIME-INR
INR: 3.2 — ABNORMAL HIGH (ref 0.8–1.2)
Prothrombin Time: 32.8 seconds — ABNORMAL HIGH (ref 11.4–15.2)

## 2020-12-18 MED ORDER — PANTOPRAZOLE SODIUM 40 MG PO TBEC
40.0000 mg | DELAYED_RELEASE_TABLET | Freq: Every day | ORAL | Status: DC
  Administered 2020-12-18: 40 mg via ORAL
  Filled 2020-12-18: qty 1

## 2020-12-18 MED ORDER — MAGNESIUM OXIDE -MG SUPPLEMENT 400 (240 MG) MG PO TABS
400.0000 mg | ORAL_TABLET | Freq: Two times a day (BID) | ORAL | Status: DC
  Administered 2020-12-18: 400 mg via ORAL
  Filled 2020-12-18: qty 1

## 2020-12-18 MED ORDER — DOCUSATE SODIUM 100 MG PO CAPS
100.0000 mg | ORAL_CAPSULE | Freq: Two times a day (BID) | ORAL | 0 refills | Status: DC
Start: 2020-12-18 — End: 2021-01-02

## 2020-12-18 MED ORDER — FE FUMARATE-B12-VIT C-FA-IFC PO CAPS: 1.0000 | ORAL_CAPSULE | Freq: Two times a day (BID) | ORAL | 1 refills | Status: DC

## 2020-12-18 NOTE — Discharge Summary (Signed)
Physician Discharge Summary  Anthony Gould OZD:[id number] DOB: 22-Mar-1934 DOA: 12/02/2020  PCP: Cassandria Anger, MD  Admit date: 12/02/2020 Discharge date: 12/18/2020  Admitted From: Home Disposition: Home  Recommendations for Outpatient Follow-up:  Follow up with PCP in 1-2 weeks Follow-up with general surgery Follow-up at Coumadin clinic on Tuesday-please check INR and adjust Coumadin accordingly. Please obtain BMP/CBC in one week Please follow up on the following pending results: None  Home Health: No Equipment/Devices: Rolling walker Discharge Condition: Stable CODE STATUS: Full Diet recommendation: Heart Healthy / Carb Modified   Brief/Interim Summary: Anthony Gould is an 87 year old male with paroxysmal atrial fibrillation, mechanical mitral valve on Coumadin, CKD stage III AAA, hypertension, pulmonary hypertension, cirrhotic changes of the liver noted on imaging with a former history of alcohol use, diabetes mellitus, history of TIA who has been hospitalized for acute appendicitis.  The patient's Coumadin was discontinued, INR was reversed and he underwent appendectomy on 9/12.  Subsequent to this he has had some ongoing mild blood loss and anticoagulation has been on hold. Heparin infusion was resumed after the surgery but then discontinue due to worsening bleeding.  Patient received total of 2 units of PRBC and hemoglobin was stable around 9.2 on discharge. Surgery cleared him to restart Coumadin with heparin bridge on 12/14/2020. Heparin bridge for discontinued on 12/18/2020 once INR was therapeutic at 3.2, goal INR of 2.5-3.5 because of mechanical valve. Improvement in serosanguineous and bloody secretions from the drain and drain was removed by surgery on 12/18/2020 before discharge. She will follow-up with surgery as an outpatient for further recommendations.  He also developed some AKI with CKD stage IIIa.  Creatinine was at baseline before discharge.  He will  continue with rest of his home medications and follow-up with his providers.   Discharge Diagnoses:  Principal Problem:   Acute appendicitis Active Problems:   DM2 (diabetes mellitus, type 2) ([place])   Hyperlipidemia   Essential hypertension   Coronary atherosclerosis   Atrial fibrillation (HCC)   MITRAL VALVE REPLACEMENT, HX OF   Chronic diastolic heart failure (HCC)   CRI (chronic renal insufficiency), stage 3 (moderate) (HCC)   H/O mitral valve replacement with mechanical valve    Discharge Instructions  Discharge Instructions     Diet - low sodium heart healthy   Complete by: As directed    Discharge instructions   Complete by: As directed    It was pleasure taking care of you. Please follow-up closely at Coumadin clinic for your Coumadin dose adjustment and levels checked. You are also being given some iron supplement-please take it as directed. Follow-up with your primary care doctor and surgeon for further recommendations.   Increase activity slowly   Complete by: As directed    Leave dressing on - Keep it clean, dry, and intact until clinic visit   Complete by: As directed    You can take the dressing of there is no significant bleeding noted in 24 hours      Allergies as of 12/18/2020       Reactions   Cardizem [diltiazem]    Bradycardia per pt Doing well on Amlodipine   Sulfonamide Derivatives    Unknown        Medication List     TAKE these medications    allopurinol 300 MG tablet Commonly known as: ZYLOPRIM TAKE 1 TABLET BY MOUTH DAILY AS NEEDED. GENERIC EQUIVALENT FOR ZYLOPRIM   amLODipine 5 MG tablet Commonly known as: NORVASC TAKE 1/2 TABLET  BY MOUTH EVERY DAY FOR 3 DAYS,IF BLOOD PRESSURE IS STILL HIGHER THAN 130 THEN INCREASE TO 1 TABLET BY MOUTH DAILY What changed: See the new instructions.   aspirin 81 MG EC tablet Take 81 mg by mouth daily.   atorvastatin 20 MG tablet Commonly known as: LIPITOR TAKE 1 TABLET BY MOUTH TWO TIMES A  WEEK What changed:  how much to take how to take this when to take this   bisoprolol 5 MG tablet Commonly known as: ZEBETA Take 0.5 tablets (2.5 mg total) by mouth daily.   dipyridamole 50 MG tablet Commonly known as: PERSANTINE Take 1 tablet (50 mg total) by mouth 3 (three) times daily.   docusate sodium 100 MG capsule Commonly known as: COLACE Take 1 capsule (100 mg total) by mouth 2 (two) times daily.   enalapril 20 MG tablet Commonly known as: VASOTEC TAKE 1 TABLET BY MOUTH DAILY.   ferrous [id number] C-folic acid capsule Commonly known as: TRINSICON / FOLTRIN Take 1 capsule by mouth 2 (two) times daily after a meal.   furosemide 40 MG tablet Commonly known as: LASIX TAKE 1 TABLET BY MOUTH DAILY GENERIC EQUIVALENT FOR LASIX What changed: See the new instructions.   latanoprost 0.005 % ophthalmic solution Commonly known as: XALATAN Place 1 drop into both eyes at bedtime.   magnesium oxide 400 MG tablet Commonly known as: MAG-OX TAKE 1 TABLET(400 MG) BY MOUTH TWICE DAILY What changed: See the new instructions.   Milk Thistle 1000 MG Caps Take 1 capsule by mouth daily.   pantoprazole 40 MG tablet Commonly known as: PROTONIX TAKE 1 TABLET BY MOUTH DAILY What changed: when to take this   Vitamin D3 50 MCG (2000 UT) capsule Take 1 capsule (2,000 Units total) by mouth daily.   warfarin 5 MG tablet Commonly known as: COUMADIN Take as directed. If you are unsure how to take this medication, talk to your nurse or doctor. Original instructions: TAKE 1 TABLET BY MOUTH AS DIRECTED BY THE COUMADIN CLINIC What changed: See the new instructions.               Discharge Care Instructions  (From admission, onward)           Start     Ordered   12/18/20 0000  Leave dressing on - Keep it clean, dry, and intact until clinic visit       Comments: You can take the dressing of there is no significant bleeding noted in 24 hours   12/18/20 1046             Follow-up Information     Coralie Keens, MD Follow up on 12/27/2020.   Specialty: General Surgery Why: 940am. Please arrive 30 minutes prior to your appointment for paperwork. Please bring a copy of your photo ID and insurance card. Contact information: [address] [place] [address] [phone]         Plotnikov, Evie Lacks, MD. Schedule an appointment as soon as possible for a visit in 1 week(s).   Specialty: Internal Medicine Contact information: [place] [address] [phone]         Burnell Blanks, MD .   Specialty: Cardiology Contact information: [address] [place] [address] [phone]                Allergies  Allergen Reactions   Cardizem [Diltiazem]     Bradycardia per pt Doing well on Amlodipine  Sulfonamide Derivatives     Unknown     Consultations: General surgery Cardiology  Procedures/Studies: CT Abdomen Pelvis Wo Contrast  Addendum Date: 12/02/2020   ADDENDUM REPORT: 12/02/2020 16:30 ADDENDUM: The original report was by Dr. Van Clines. The following addendum is by Dr. Van Clines: These results were called by telephone at the time of interpretation on 12/02/2020 at 4:24 pm to provider Dr. Jeralyn Ruths , who verbally acknowledged these results. Electronically Signed   By: Van Clines M.D.   On: 12/02/2020 16:30   Result Date: 12/02/2020 CLINICAL DATA:  Right lower quadrant abdominal pain. Reduced appetite. Low-grade fever. EXAM: CT ABDOMEN AND PELVIS WITHOUT CONTRAST TECHNIQUE: Multidetector CT imaging of the abdomen and pelvis was performed following the standard protocol without IV contrast. COMPARISON:  09/23/2015 and hepatobiliary ultrasound of 04/26/2020 FINDINGS: Lower chest: Mitral valve prosthesis. Atherosclerotic calcification involving the descending thoracic aorta and right coronary artery. Mild cardiomegaly. Hepatobiliary: Nodular  contour the liver compatible with cirrhosis. No noncontrast CT evidence of hepatic mass. Cholecystectomy. No biliary dilatation identified. Pancreas: Unremarkable Spleen: Unremarkable Adrenals/Urinary Tract: Stable scarring of the right kidney upper pole. No hydronephrosis or renal calculi identified. The adrenal glands appear unremarkable. Urinary bladder unremarkable. Stomach/Bowel: Acute appendicitis is present, with an appendicolith along the appendiceal orifice; appendiceal diameter 1.3 cm, and periappendiceal inflammatory stranding. No abnormal extraluminal gas or abscess. Vascular/Lymphatic: Atherosclerosis is present, including aortoiliac atherosclerotic disease. Reproductive: Unremarkable Other: No supplemental non-categorized findings. Musculoskeletal: Mild lumbar spondylosis. IMPRESSION: 1. Acute appendicitis, appendiceal diameter of up to 1.3 cm; appendicolith the appendiceal orifice; and Peri appendiceal inflammatory stranding. No extraluminal gas or abscess is currently identified. 2. Systemic and aortic Atherosclerosis (ICD10-I70.0). Coronary atherosclerosis. 3. Hepatic cirrhosis. Radiology assistant personnel have been notified to put me in telephone contact with the referring physician or the referring physician's clinical representative in order to discuss these findings. Once this communication is established I will issue an addendum to this report for documentation purposes. Electronically Signed: By: Van Clines M.D. On: 12/02/2020 16:13    Subjective: Patient was seen and examined today.  No new complaints.  Surgical drain has been removed. Denies any pain.  No abnormal bleeding.  Wants to go home. Wife and son at bedside.  Discharge Exam: Vitals:   12/18/20 0508 12/18/20 0842  BP: 130/66 135/60  Pulse: 71   Resp: 18   Temp: 98.5 F (36.9 C) (!) 97.5 F (36.4 C)  SpO2: 96%    Vitals:   12/17/20 1140 12/17/20 2058 12/18/20 0508 12/18/20 0842  BP: (!) 137/56 131/65  130/66 135/60  Pulse: 71 74 71   Resp:  18 18   Temp: 98 F (36.7 C) 97.7 F (36.5 C) 98.5 F (36.9 C) (!) 97.5 F (36.4 C)  TempSrc: Oral Oral Oral Oral  SpO2: 97% 97% 96%   Weight:   79.7 kg   Height:        General: Pt is alert, awake, not in acute distress Cardiovascular: RRR, S1/S2 +, no rubs, no gallops Respiratory: CTA bilaterally, no wheezing, no rhonchi Abdominal: Soft, NT, ND, bowel sounds +, clean bandage on left lower quadrant Extremities: 1+ LE edema, no cyanosis   The results of significant diagnostics from this hospitalization (including imaging, microbiology, ancillary and laboratory) are listed below for reference.    Microbiology: No results found for this or any previous visit (from the past 240 hour(s)).   Labs: BNP (last 3 results) No results for input(s): BNP in the last 8760 hours. Basic  Metabolic Panel: Recent Labs  Lab 12/13/20 0343 12/14/20 0343 12/17/20 0351 12/18/20 0245  NA 134* 132* 132* 133*  K 3.8 3.7 3.7 3.7  CL 102 100 100 100  CO2 23 23 25 25   GLUCOSE 98 99 93 104*  BUN 8 6* 7* 8  CREATININE 1.00 1.09 0.96 0.93  CALCIUM 8.3* 7.9* 8.1* 8.2*   Liver Function Tests: No results for input(s): AST, ALT, ALKPHOS, BILITOT, PROT, ALBUMIN in the last 168 hours. No results for input(s): LIPASE, AMYLASE in the last 168 hours. No results for input(s): AMMONIA in the last 168 hours. CBC: Recent Labs  Lab 12/14/20 0343 12/15/20 0412 12/16/20 0240 12/17/20 0351 12/18/20 0245  WBC 10.5 9.1 6.7 6.9 5.7  NEUTROABS 8.3* 6.9 5.1 5.2 4.1  HGB 9.0* 9.4* 8.8* 9.0* 9.2*  HCT 27.0* 27.5* 26.6* 27.3* 27.3*  MCV 97.5 97.5 98.2 97.2 96.8  PLT 229 226 238 258 226   Cardiac Enzymes: No results for input(s): CKTOTAL, CKMB, CKMBINDEX, TROPONINI in the last 168 hours. BNP: Invalid input(s): POCBNP CBG: Recent Labs  Lab 12/11/20 1106 12/12/20 1221 12/12/20 1852  GLUCAP 120* 134* 132*   D-Dimer No results for input(s): DDIMER in the last 72  hours. Hgb A1c No results for input(s): HGBA1C in the last 72 hours. Lipid Profile No results for input(s): CHOL, HDL, LDLCALC, TRIG, CHOLHDL, LDLDIRECT in the last 72 hours. Thyroid function studies No results for input(s): TSH, T4TOTAL, T3FREE, THYROIDAB in the last 72 hours.  Invalid input(s): FREET3 Anemia work up Recent Labs    12/16/20 0909  VITAMINB12 436  FOLATE 8.0  FERRITIN 247  TIBC 238*  IRON 28*  RETICCTPCT 6.3*   Urinalysis    Component Value Date/Time   COLORURINE YELLOW 12/02/2020 [place] 12/02/2020 1143   LABSPEC 1.015 12/02/2020 1143   PHURINE 5.0 12/02/2020 1143   GLUCOSEU NEGATIVE 12/02/2020 1143   [place] 12/02/2020 1143   [place] 12/02/2020 1143   KETONESUR NEGATIVE 12/02/2020 1143   PROTEINUR 30 (A) 08/07/2013 1633   UROBILINOGEN 0.2 12/02/2020 1143   NITRITE NEGATIVE 12/02/2020 1143   LEUKOCYTESUR NEGATIVE 12/02/2020 1143   Sepsis Labs Invalid input(s): PROCALCITONIN,  WBC,  LACTICIDVEN Microbiology No results found for this or any previous visit (from the past 240 hour(s)).  Time coordinating discharge: Over 30 minutes  SIGNED:  Lorella Nimrod, MD  [facility] 12/18/2020, 10:47 AM  If 7PM-7AM, please contact night-coverage [email or web address]  This record has been created using Systems analyst. Errors have been sought and corrected,but may not always be located. Such creation errors do not reflect on the standard of care.

## 2020-12-18 NOTE — Progress Notes (Signed)
ANTICOAGULATION CONSULT NOTE - Follow Up Consult  Pharmacy Consult for enoxaparin and warfarin Indication:  mechanical mitral valve  Allergies  Allergen Reactions   Cardizem [Diltiazem]     Bradycardia per pt Doing well on Amlodipine   Sulfonamide Derivatives     Unknown     Patient Measurements: Height: 5\' 10"  (177.8 cm) Weight: 79.7 kg (175 lb 11.3 oz) IBW/kg (Calculated) : 73  Vital Signs: Temp: 98.5 F (36.9 C) (09/25 0508) Temp Source: Oral (09/25 0508) BP: 130/66 (09/25 0508) Pulse Rate: 71 (09/25 0508)  Labs: Recent Labs    12/15/20 1243 12/16/20 0240 12/16/20 0240 12/17/20 0351 12/18/20 0245  HGB  --  8.8*   < > 9.0* 9.2*  HCT  --  26.6*  --  27.3* 27.3*  PLT  --  238  --  258 226  LABPROT  --  20.1*  --  23.9* 32.8*  INR  --  1.7*  --  2.1* 3.2*  HEPARINUNFRC 0.71* 0.57  --   --   --   CREATININE  --   --   --  0.96 0.93   < > = values in this interval not displayed.     Estimated Creatinine Clearance: 60 mL/min (by C-G formula based on SCr of 0.93 mg/dL).  Medications:  Medications Prior to Admission  Medication Sig Dispense Refill Last Dose   allopurinol (ZYLOPRIM) 300 MG tablet TAKE 1 TABLET BY MOUTH DAILY AS NEEDED. GENERIC EQUIVALENT FOR ZYLOPRIM 90 tablet 3 12/02/2020   amLODipine (NORVASC) 5 MG tablet TAKE 1/2 TABLET BY MOUTH EVERY DAY FOR 3 DAYS,IF BLOOD PRESSURE IS STILL HIGHER THAN 130 THEN INCREASE TO 1 TABLET BY MOUTH DAILY (Patient taking differently: Take 5 mg by mouth daily.) 90 tablet 3 12/02/2020   aspirin 81 MG EC tablet Take 81 mg by mouth daily.   12/02/2020   atorvastatin (LIPITOR) 20 MG tablet TAKE 1 TABLET BY MOUTH TWO TIMES A WEEK (Patient taking differently: Take 20 mg by mouth See admin instructions. TAKE 1 TABLET BY MOUTH TWO TIMES A WEEK) 26 tablet 3 12/02/2020   bisoprolol (ZEBETA) 5 MG tablet Take 0.5 tablets (2.5 mg total) by mouth daily. 45 tablet 3 Past Week   Cholecalciferol (VITAMIN D3) 50 MCG (2000 UT) capsule Take 1 capsule  (2,000 Units total) by mouth daily. 100 capsule 3 12/02/2020   dipyridamole (PERSANTINE) 50 MG tablet Take 1 tablet (50 mg total) by mouth 3 (three) times daily. 270 tablet 1 12/02/2020   enalapril (VASOTEC) 20 MG tablet TAKE 1 TABLET BY MOUTH DAILY. 90 tablet 3 12/02/2020   furosemide (LASIX) 40 MG tablet TAKE 1 TABLET BY MOUTH DAILY GENERIC EQUIVALENT FOR LASIX (Patient taking differently: Take 40 mg by mouth in the morning.) 90 tablet 3 12/02/2020   latanoprost (XALATAN) 0.005 % ophthalmic solution Place 1 drop into both eyes at bedtime.   Past Week   magnesium oxide (MAG-OX) 400 MG tablet TAKE 1 TABLET(400 MG) BY MOUTH TWICE DAILY (Patient taking differently: Take 400 mg by mouth 2 (two) times daily.) 60 tablet 7 12/02/2020   Milk Thistle 1000 MG CAPS Take 1 capsule by mouth daily.   Past Week   pantoprazole (PROTONIX) 40 MG tablet TAKE 1 TABLET BY MOUTH DAILY (Patient taking differently: Take 40 mg by mouth in the morning.) 90 tablet 3 12/02/2020   warfarin (COUMADIN) 5 MG tablet TAKE 1 TABLET BY MOUTH AS DIRECTED BY THE COUMADIN CLINIC (Patient taking differently: Take 5 mg by mouth  in the morning. Take 1 tablet by mouth as directed by the coumadin clinic) 100 tablet 1 12/02/2020   Scheduled:   allopurinol  300 mg Oral Daily   amLODipine  5 mg Oral Daily   atorvastatin  20 mg Oral Once per day on Wed Sat   bisoprolol  2.5 mg Oral Daily   docusate sodium  100 mg Oral BID   enoxaparin (LOVENOX) injection  80 mg Subcutaneous Q12H   feeding supplement  1 Container Oral TID BM   feeding supplement  237 mL Oral BID BM   ferrous [id number] C-folic acid  1 capsule Oral BID PC   furosemide  40 mg Oral q AM   latanoprost  1 drop Both Eyes QHS   polyethylene glycol  17 g Oral Daily   Warfarin - Pharmacist Dosing Inpatient   Does not apply q1600   Infusions:   sodium chloride Stopped (12/15/20 1006)    Assessment: 87 year old male on warfarin at home for mechanical mitral valve. Warfarin held and  reversed with vitamin K for appendectomy on 9/12. Patient received 1 unit of blood on 9/18, and anticoagulation was held.  Pharmacy consulted to restart warfarin with enoxaparin bridging. PMH includes history of cirrhosis, which may impact INR.   PTA warfarin 5 mg daily  INR 3.2 is therapeutic after 7.5 mg x5 days. Hgb is up at 9.2. Plt wnl.  Goal of Therapy:  INR 2.5-3.5 Monitor platelets by anticoagulation protocol: Yes   Plan:  Stop therapeutic enoxaparin bridge Give warfarin 5mg  PO x 1 today Monitor daily INR, CBC, s/s bleeding  Laurey Arrow, PharmD PGY1 Pharmacy Resident 12/18/2020  7:17 AM  Please check [email or web address] for unit-specific pharmacy phone numbers.

## 2020-12-18 NOTE — Progress Notes (Addendum)
13 Days Post-Op  Subjective: CC: Doing well. No abdominal pain this am. RLQ pain resolved. Tolerating diet without n/v. Voiding. Last BM yesterday. Mobilizing. No drain output recorded  Objective: Vital signs in last 24 hours: Temp:  [97.5 F (36.4 C)-98.5 F (36.9 C)] 97.5 F (36.4 C) (09/25 0842) Pulse Rate:  [71-74] 71 (09/25 0508) Resp:  [18] 18 (09/25 0508) BP: (130-137)/(56-66) 135/60 (09/25 0842) SpO2:  [96 %-97 %] 96 % (09/25 0508) Weight:  [79.7 kg] 79.7 kg (09/25 0508) Last BM Date: 12/17/20  Intake/Output from previous day: 09/24 0701 - 09/25 0700 In: 600 [P.O.:600] Out: 1150 [Urine:1150] Intake/Output this shift: No intake/output data recorded.  PE: Gen:  Alert, NAD, pleasant Pulm:  rate and effort normal Abd: Soft, nondistended, NT, incisions cdi with steri strips in place (some bruising around umbilical incision), JP drain with what looks like old dark hematoma, similar to yesterday.  Psych: A&Ox3  Lab Results:  Recent Labs    12/17/20 0351 12/18/20 0245  WBC 6.9 5.7  HGB 9.0* 9.2*  HCT 27.3* 27.3*  PLT 258 226   BMET Recent Labs    12/17/20 0351 12/18/20 0245  NA 132* 133*  K 3.7 3.7  CL 100 100  CO2 25 25  GLUCOSE 93 104*  BUN 7* 8  CREATININE 0.96 0.93  CALCIUM 8.1* 8.2*   PT/INR Recent Labs    12/17/20 0351 12/18/20 0245  LABPROT 23.9* 32.8*  INR 2.1* 3.2*   CMP     Component Value Date/Time   NA 133 (L) 12/18/2020 0245   K 3.7 12/18/2020 0245   CL 100 12/18/2020 0245   CO2 25 12/18/2020 0245   GLUCOSE 104 (H) 12/18/2020 0245   BUN 8 12/18/2020 0245   CREATININE 0.93 12/18/2020 0245   CREATININE 1.40 (H) 04/01/2015 1540   CALCIUM 8.2 (L) 12/18/2020 0245   PROT 5.6 (L) 12/07/2020 1001   ALBUMIN 2.9 (L) 12/07/2020 1001   AST 31 12/07/2020 1001   ALT 21 12/07/2020 1001   ALKPHOS 61 12/07/2020 1001   BILITOT 1.7 (H) 12/07/2020 1001   GFRNONAA >60 12/18/2020 0245   GFRAA 107 11/17/2007 1231   Lipase     Component  Value Date/Time   LIPASE 15.0 12/18/2017 0828    Studies/Results: No results found.  Anti-infectives: Anti-infectives (From admission, onward)    Start     Dose/Rate Route Frequency Ordered Stop   12/09/20 1330  vancomycin (VANCOREADY) IVPB 1500 mg/300 mL  Status:  Discontinued        1,500 mg 150 mL/hr over 120 Minutes Intravenous Every 48 hours 12/07/20 1531 12/08/20 0835   12/09/20 0900  Ampicillin-Sulbactam (UNASYN) 3 g in sodium chloride 0.9 % 100 mL IVPB  Status:  Discontinued        3 g 200 mL/hr over 30 Minutes Intravenous Every 8 hours 12/09/20 0836 12/14/20 1023   12/08/20 1100  vancomycin (VANCOCIN) IVPB 1000 mg/200 mL premix  Status:  Discontinued        1,000 mg 200 mL/hr over 60 Minutes Intravenous Every 24 hours 12/07/20 1033 12/07/20 1531   12/07/20 1900  Ampicillin-Sulbactam (UNASYN) 3 g in sodium chloride 0.9 % 100 mL IVPB  Status:  Discontinued        3 g 200 mL/hr over 30 Minutes Intravenous Every 12 hours 12/07/20 1542 12/09/20 0836   12/07/20 1130  vancomycin (VANCOREADY) IVPB 1500 mg/300 mL        1,500 mg 150 mL/hr over 120  Minutes Intravenous  Once 12/07/20 1031 12/07/20 1618   12/03/20 0200  piperacillin-tazobactam (ZOSYN) IVPB 3.375 g  Status:  Discontinued        3.375 g 12.5 mL/hr over 240 Minutes Intravenous Every 8 hours 12/03/20 0121 12/07/20 1542   12/03/20 0100  piperacillin-tazobactam (ZOSYN) IVPB 3.375 g  Status:  Discontinued        3.375 g 100 mL/hr over 30 Minutes Intravenous  Once 12/03/20 0047 12/03/20 0121        Assessment/Plan POD#13 s/p Laparoscopic appendectomy for Acute appendicitis 9/12 Dr. Barry Dienes  - Appendiceal orifice was sutured with a pursestring 2-0 vicryl using an Endostitch, drain was placed adjacent to the cecum - Likely bled from this suture line previously. Now back on Coumadin - Hgb stable, drain output minimal, INR 3.2. Will d/c drain - Okay for d/c form our standpoint   ID - zosyn 9/10>>9/14, vancomycin  9/14>>9/15, unasyn 9/14 - 9/21 FEN - IVF per trh, heart healthy diet, ensure  VTE - Coumadin  Foley - none   Mechanical mitral valve replacement on coumadin at home Permanent A Fib HTN CAD AKI on CKD IIIa - resolved CHF Cirrhosis   LOS: 15 days    Jillyn Ledger , [facility] Surgery 12/18/2020, 9:17 AM Please see Amion for pager number during day hours 7:00am-4:30pm

## 2020-12-22 ENCOUNTER — Telehealth: Payer: Self-pay | Admitting: Internal Medicine

## 2020-12-22 NOTE — Telephone Encounter (Signed)
Patient says he is still having severe pain & bleeding after having the surgery for his appendicitis.. patient says hes not sure if provider missed something on CT Scan & that is whats causing him his pain still  Please follow up/ patient

## 2020-12-23 ENCOUNTER — Ambulatory Visit: Payer: Medicare Other | Admitting: *Deleted

## 2020-12-23 ENCOUNTER — Other Ambulatory Visit: Payer: Self-pay

## 2020-12-23 ENCOUNTER — Ambulatory Visit: Payer: Medicare Other | Admitting: Internal Medicine

## 2020-12-23 ENCOUNTER — Telehealth: Payer: Self-pay | Admitting: *Deleted

## 2020-12-23 DIAGNOSIS — I4891 Unspecified atrial fibrillation: Secondary | ICD-10-CM | POA: Diagnosis not present

## 2020-12-23 DIAGNOSIS — I059 Rheumatic mitral valve disease, unspecified: Secondary | ICD-10-CM | POA: Diagnosis not present

## 2020-12-23 DIAGNOSIS — Z9889 Other specified postprocedural states: Secondary | ICD-10-CM

## 2020-12-23 DIAGNOSIS — I6350 Cerebral infarction due to unspecified occlusion or stenosis of unspecified cerebral artery: Secondary | ICD-10-CM | POA: Diagnosis not present

## 2020-12-23 DIAGNOSIS — R509 Fever, unspecified: Secondary | ICD-10-CM | POA: Diagnosis not present

## 2020-12-23 DIAGNOSIS — Z7901 Long term (current) use of anticoagulants: Secondary | ICD-10-CM | POA: Diagnosis not present

## 2020-12-23 LAB — PROTIME-INR
INR: 8.3 (ref 0.9–1.2)
INR: 8.3 — AB (ref 0.9–1.1)
Prothrombin Time: 76.5 s — ABNORMAL HIGH (ref 9.1–12.0)

## 2020-12-23 NOTE — Telephone Encounter (Signed)
Rec'd msg from Johny Shock, RN w/ coumadin clinic she states. "  I checked [name]'s INR and it was 6.8, I sent him to the lab and he is getting another INR for second verification. "....Johny Chess

## 2020-12-23 NOTE — Patient Instructions (Addendum)
Description    Called instructed pt to hold warfarin until INR appointment on 10/3- Further dosing instructions will be given at that time. Normal dose of warfarin taking 5mg  daily.  Coumadin Clinic [phone]

## 2020-12-23 NOTE — Telephone Encounter (Signed)
Also, pt stated that he has been running a low grade fever and has been taking tylenol for it, I am not sure if that is something that Dr. Alain Marion would address.

## 2020-12-23 NOTE — Telephone Encounter (Signed)
POC INR came back at 6.8, Lab INR came back at 8.3. Pt called and made aware, instructed him to hold warfarin and will recheck INR on Monday at the coumadin clinic.   Pt reached out to [facility] making them aware of his fevers ( per care everywhere). Called pt at 4:00pm to check in on him. Pt stated that he has not heard back but he was told to go to [facility]. He has not been running a fever today and would make [place] aware if he was started on any antibiotics- since that can interfere with his warfarin.

## 2020-12-25 NOTE — Telephone Encounter (Signed)
Anthony Gould should get in touch with surgery in regard to his pain/fever.  His last CT scan was done on 12/02/2020 which showed acute appendicitis.  I do not see any repeat CT scans on file.  Me or one of my partners could see him in the office if needed. Thanks

## 2020-12-25 NOTE — Telephone Encounter (Signed)
I agree with plan.  Anthony Gould should get in touch with surgery in regard to his pain/fever.  His last CT scan was done on 12/02/2020 which showed acute appendicitis.  I do not see any repeat CT scans. Thanks

## 2020-12-26 ENCOUNTER — Other Ambulatory Visit: Payer: Self-pay

## 2020-12-26 ENCOUNTER — Ambulatory Visit: Payer: Medicare Other | Admitting: *Deleted

## 2020-12-26 DIAGNOSIS — Z7901 Long term (current) use of anticoagulants: Secondary | ICD-10-CM

## 2020-12-26 DIAGNOSIS — I4891 Unspecified atrial fibrillation: Secondary | ICD-10-CM

## 2020-12-26 DIAGNOSIS — Z9889 Other specified postprocedural states: Secondary | ICD-10-CM | POA: Diagnosis not present

## 2020-12-26 DIAGNOSIS — I6350 Cerebral infarction due to unspecified occlusion or stenosis of unspecified cerebral artery: Secondary | ICD-10-CM

## 2020-12-26 DIAGNOSIS — I059 Rheumatic mitral valve disease, unspecified: Secondary | ICD-10-CM

## 2020-12-26 DIAGNOSIS — Z5181 Encounter for therapeutic drug level monitoring: Secondary | ICD-10-CM

## 2020-12-26 LAB — POCT INR: INR: 4.9 — AB (ref 2.0–3.0)

## 2020-12-26 NOTE — Patient Instructions (Addendum)
Description   Hold warfarin today -Take 1/2 a tablet tomorrow -Then start taking warfarin 1 tablet daily except for 1/2 a tablet on Mondays. Recheck INR in 5 days.  Coumadin Clinic [phone].

## 2020-12-27 ENCOUNTER — Other Ambulatory Visit: Payer: Self-pay

## 2020-12-27 NOTE — Telephone Encounter (Signed)
Pt has f/u appt 12/28/20.Marland KitchenJohny Gould

## 2020-12-28 ENCOUNTER — Encounter: Payer: Self-pay | Admitting: Internal Medicine

## 2020-12-28 ENCOUNTER — Ambulatory Visit (INDEPENDENT_AMBULATORY_CARE_PROVIDER_SITE_OTHER): Payer: Medicare Other | Admitting: Internal Medicine

## 2020-12-28 ENCOUNTER — Ambulatory Visit (INDEPENDENT_AMBULATORY_CARE_PROVIDER_SITE_OTHER): Payer: Medicare Other | Admitting: Interventional Cardiology

## 2020-12-28 VITALS — BP 128/60 | HR 89 | Temp 97.6°F | Ht 70.0 in | Wt 173.8 lb

## 2020-12-28 DIAGNOSIS — R1084 Generalized abdominal pain: Secondary | ICD-10-CM

## 2020-12-28 DIAGNOSIS — K7460 Unspecified cirrhosis of liver: Secondary | ICD-10-CM | POA: Diagnosis not present

## 2020-12-28 DIAGNOSIS — K3580 Unspecified acute appendicitis: Secondary | ICD-10-CM | POA: Diagnosis not present

## 2020-12-28 DIAGNOSIS — Z5181 Encounter for therapeutic drug level monitoring: Secondary | ICD-10-CM

## 2020-12-28 DIAGNOSIS — R109 Unspecified abdominal pain: Secondary | ICD-10-CM

## 2020-12-28 DIAGNOSIS — D500 Iron deficiency anemia secondary to blood loss (chronic): Secondary | ICD-10-CM

## 2020-12-28 DIAGNOSIS — D649 Anemia, unspecified: Secondary | ICD-10-CM | POA: Insufficient documentation

## 2020-12-28 LAB — URINALYSIS
Hgb urine dipstick: NEGATIVE
Ketones, ur: NEGATIVE
Leukocytes,Ua: NEGATIVE
Nitrite: NEGATIVE
Specific Gravity, Urine: 1.01 (ref 1.000–1.030)
Total Protein, Urine: NEGATIVE
Urine Glucose: NEGATIVE
Urobilinogen, UA: 0.2 (ref 0.0–1.0)
pH: 6 (ref 5.0–8.0)

## 2020-12-28 LAB — COMPREHENSIVE METABOLIC PANEL
ALT: 28 U/L (ref 0–53)
AST: 42 U/L — ABNORMAL HIGH (ref 0–37)
Albumin: 3.2 g/dL — ABNORMAL LOW (ref 3.5–5.2)
Alkaline Phosphatase: 106 U/L (ref 39–117)
BUN: 15 mg/dL (ref 6–23)
CO2: 27 mEq/L (ref 19–32)
Calcium: 8.8 mg/dL (ref 8.4–10.5)
Chloride: 93 mEq/L — ABNORMAL LOW (ref 96–112)
Creatinine, Ser: 1.08 mg/dL (ref 0.40–1.50)
GFR: 62.59 mL/min (ref >60.00)
Glucose, Bld: 108 mg/dL — ABNORMAL HIGH (ref 70–99)
Potassium: 3.7 mEq/L (ref 3.5–5.1)
Sodium: 130 mEq/L — ABNORMAL LOW (ref 135–145)
Total Bilirubin: 1.2 mg/dL (ref 0.2–1.2)
Total Protein: 6.5 g/dL (ref 6.0–8.3)

## 2020-12-28 LAB — PROTIME-INR
INR: 4.4 ratio — ABNORMAL HIGH (ref 0.8–1.0)
Prothrombin Time: 44.3 s — ABNORMAL HIGH (ref 9.6–13.1)

## 2020-12-28 LAB — CBC WITH DIFFERENTIAL/PLATELET
Basophils Absolute: 0 10*3/uL (ref 0.0–0.1)
Basophils Relative: 0.3 % (ref 0.0–3.0)
Eosinophils Absolute: 0.1 10*3/uL (ref 0.0–0.7)
Eosinophils Relative: 0.9 % (ref 0.0–5.0)
HCT: 31.5 % — ABNORMAL LOW (ref 39.0–52.0)
Hemoglobin: 10.4 g/dL — ABNORMAL LOW (ref 13.0–17.0)
Lymphocytes Relative: 4.4 % — ABNORMAL LOW (ref 12.0–46.0)
Lymphs Abs: 0.5 10*3/uL — ABNORMAL LOW (ref 0.7–4.0)
MCHC: 33 g/dL (ref 30.0–36.0)
MCV: 94.9 fl (ref 78.0–100.0)
Monocytes Absolute: 0.8 10*3/uL (ref 0.1–1.0)
Monocytes Relative: 6.9 % (ref 3.0–12.0)
Neutro Abs: 10.3 10*3/uL — ABNORMAL HIGH (ref 1.4–7.7)
Neutrophils Relative %: 87.5 % — ABNORMAL HIGH (ref 43.0–77.0)
Platelets: 425 10*3/uL — ABNORMAL HIGH (ref 150.0–400.0)
RBC: 3.32 Mil/uL — ABNORMAL LOW (ref 4.22–5.81)
RDW: 16.3 % — ABNORMAL HIGH (ref 11.5–15.5)
WBC: 11.8 10*3/uL — ABNORMAL HIGH (ref 4.0–10.5)

## 2020-12-28 LAB — LIPASE: Lipase: 8 U/L — ABNORMAL LOW (ref 11.0–59.0)

## 2020-12-28 NOTE — Assessment & Plan Note (Signed)
S/p recent surgery Check CBC, abd CT

## 2020-12-28 NOTE — Assessment & Plan Note (Signed)
R/o hematoma, abscess, liver pathology in the R 1/2 of abdomen.  CT abd/pelvis CT today CBC, INR, CMET, UA, lipase

## 2020-12-28 NOTE — Addendum Note (Signed)
Addended by: Boris Lown B on: 12/28/2020 08:40 AM   Modules accepted: Orders

## 2020-12-28 NOTE — Assessment & Plan Note (Signed)
Check LFTs, INR Abd CT

## 2020-12-28 NOTE — Telephone Encounter (Signed)
Pt had appt this am w/ MD../lmb

## 2020-12-28 NOTE — Progress Notes (Signed)
Subjective:  Patient ID: Anthony Gould, male    DOB: 08/27/1933  Age: 87 y.o. MRN: [id number]  CC: Follow-up (3 month f/u)   HPI Anthony Gould presents for severe R side pain and fever C/o severe RUQ pain that he had before and after surgery - 3/10 at rest and 5-6/10 with moving. No extreme pain. No recent fever. No n/v. No  appetite... F/u anemia  He is here w/his wife  Per hx: "Admit date: 12/02/2020 Discharge date: 12/18/2020   Admitted From: Home Disposition: Home   Recommendations for Outpatient Follow-up:  Follow up with PCP in 1-2 weeks Follow-up with general surgery Follow-up at Coumadin clinic on Tuesday-please check INR and adjust Coumadin accordingly. Please obtain BMP/CBC in one week Please follow up on the following pending results: None   Home Health: No Equipment/Devices: Rolling walker Discharge Condition: Stable CODE STATUS: Full Diet recommendation: Heart Healthy / Carb Modified    Brief/Interim Summary: Anthony Gould is an 87 year old male with paroxysmal atrial fibrillation, mechanical mitral valve on Coumadin, CKD stage III AAA, hypertension, pulmonary hypertension, cirrhotic changes of the liver noted on imaging with a former history of alcohol use, diabetes mellitus, history of TIA who has been hospitalized for acute appendicitis.  The patient's Coumadin was discontinued, INR was reversed and he underwent appendectomy on 9/12.  Subsequent to this he has had some ongoing mild blood loss and anticoagulation has been on hold. Heparin infusion was resumed after the surgery but then discontinue due to worsening bleeding.  Patient received total of 2 units of PRBC and hemoglobin was stable around 9.2 on discharge. Surgery cleared him to restart Coumadin with heparin bridge on 12/14/2020. Heparin bridge for discontinued on 12/18/2020 once INR was therapeutic at 3.2, goal INR of 2.5-3.5 because of mechanical valve. Improvement in serosanguineous and  bloody secretions from the drain and drain was removed by surgery on 12/18/2020 before discharge. She will follow-up with surgery as an outpatient for further recommendations.   He also developed some AKI with CKD stage IIIa.  Creatinine was at baseline before discharge.   He will continue with rest of his home medications and follow-up with his providers.     Discharge Diagnoses:  Principal Problem:   Acute appendicitis Active Problems:   DM2 (diabetes mellitus, type 2) ([place])   Hyperlipidemia   Essential hypertension   Coronary atherosclerosis   Atrial fibrillation (HCC)   MITRAL VALVE REPLACEMENT, HX OF   Chronic diastolic heart failure (HCC)   CRI (chronic renal insufficiency), stage 3 (moderate) (HCC)   H/O mitral valve replacement with mechanical valve"        Outpatient Medications Prior to Visit  Medication Sig Dispense Refill   allopurinol (ZYLOPRIM) 300 MG tablet TAKE 1 TABLET BY MOUTH DAILY AS NEEDED. GENERIC EQUIVALENT FOR ZYLOPRIM 90 tablet 3   amLODipine (NORVASC) 5 MG tablet TAKE 1/2 TABLET BY MOUTH EVERY DAY FOR 3 DAYS,IF BLOOD PRESSURE IS STILL HIGHER THAN 130 THEN INCREASE TO 1 TABLET BY MOUTH DAILY (Patient taking differently: Take 5 mg by mouth daily.) 90 tablet 3   aspirin 81 MG EC tablet Take 81 mg by mouth daily.     atorvastatin (LIPITOR) 20 MG tablet TAKE 1 TABLET BY MOUTH TWO TIMES A WEEK (Patient taking differently: Take 20 mg by mouth See admin instructions. TAKE 1 TABLET BY MOUTH TWO TIMES A WEEK) 26 tablet 3   bisoprolol (ZEBETA) 5 MG tablet Take 0.5 tablets (2.5 mg total) by mouth daily.  45 tablet 3   Cholecalciferol (VITAMIN D3) 50 MCG (2000 UT) capsule Take 1 capsule (2,000 Units total) by mouth daily. 100 capsule 3   dipyridamole (PERSANTINE) 50 MG tablet Take 1 tablet (50 mg total) by mouth 3 (three) times daily. 270 tablet 1   docusate sodium (COLACE) 100 MG capsule Take 1 capsule (100 mg total) by mouth 2 (two) times daily. 10 capsule 0    enalapril (VASOTEC) 20 MG tablet TAKE 1 TABLET BY MOUTH DAILY. 90 tablet 3   ferrous [id number] C-folic acid (TRINSICON / FOLTRIN) capsule Take 1 capsule by mouth 2 (two) times daily after a meal. 60 capsule 1   furosemide (LASIX) 40 MG tablet TAKE 1 TABLET BY MOUTH DAILY GENERIC EQUIVALENT FOR LASIX (Patient taking differently: Take 40 mg by mouth in the morning.) 90 tablet 3   latanoprost (XALATAN) 0.005 % ophthalmic solution Place 1 drop into both eyes at bedtime.     magnesium oxide (MAG-OX) 400 MG tablet TAKE 1 TABLET(400 MG) BY MOUTH TWICE DAILY (Patient taking differently: Take 400 mg by mouth 2 (two) times daily.) 60 tablet 7   Milk Thistle 1000 MG CAPS Take 1 capsule by mouth daily.     pantoprazole (PROTONIX) 40 MG tablet TAKE 1 TABLET BY MOUTH DAILY (Patient taking differently: Take 40 mg by mouth in the morning.) 90 tablet 3   warfarin (COUMADIN) 5 MG tablet TAKE 1 TABLET BY MOUTH AS DIRECTED BY THE COUMADIN CLINIC (Patient taking differently: Take 5 mg by mouth in the morning. Take 1 tablet by mouth as directed by the coumadin clinic) 100 tablet 1   No facility-administered medications prior to visit.    ROS: Review of Systems  Constitutional:  Positive for appetite change, fatigue and unexpected weight change.  HENT:  Negative for congestion, nosebleeds, sneezing, sore throat and trouble swallowing.   Eyes:  Negative for itching and visual disturbance.  Respiratory:  Negative for cough.   Cardiovascular:  Negative for chest pain, palpitations and leg swelling.  Gastrointestinal:  Positive for abdominal pain and diarrhea. Negative for abdominal distention, blood in stool, nausea, rectal pain and vomiting.  Genitourinary:  Negative for frequency and hematuria.  Musculoskeletal:  Positive for gait problem. Negative for back pain, joint swelling and neck pain.  Skin:  Positive for wound. Negative for rash.  Neurological:  Positive for weakness. Negative for dizziness,  tremors and speech difficulty.  Hematological:  Bruises/bleeds easily.  Psychiatric/Behavioral:  Negative for agitation, dysphoric mood and sleep disturbance. The patient is not nervous/anxious.    Objective:  BP 128/60 (BP Location: Left Arm)   Pulse 89   Temp 97.6 F (36.4 C) (Oral)   Ht 5\' 10"  (1.778 m)   Wt 173 lb 12.8 oz (78.8 kg)   SpO2 95%   BMI 24.94 kg/m   BP Readings from Last 3 Encounters:  12/28/20 128/60  12/18/20 135/60  12/02/20 120/64    Wt Readings from Last 3 Encounters:  12/28/20 173 lb 12.8 oz (78.8 kg)  12/18/20 175 lb 11.3 oz (79.7 kg)  12/02/20 168 lb 9.6 oz (76.5 kg)    Physical Exam Constitutional:      General: He is not in acute distress.    Appearance: Normal appearance. He is well-developed. He is not ill-appearing, toxic-appearing or diaphoretic.     Comments: NAD  Eyes:     Conjunctiva/sclera: Conjunctivae normal.     Pupils: Pupils are equal, round, and reactive to light.  Neck:  Thyroid: No thyromegaly.     Vascular: No JVD.  Cardiovascular:     Rate and Rhythm: Normal rate and regular rhythm.     Heart sounds: Normal heart sounds. No murmur heard.   No friction rub. No gallop.  Pulmonary:     Effort: Pulmonary effort is normal. No respiratory distress.     Breath sounds: Normal breath sounds. No wheezing or rales.  Chest:     Chest wall: No tenderness.  Abdominal:     General: Bowel sounds are normal. There is no distension.     Palpations: Abdomen is soft. There is mass.     Tenderness: There is abdominal tenderness. There is no guarding or rebound.  Musculoskeletal:        General: No tenderness. Normal range of motion.     Cervical back: Normal range of motion.  Lymphadenopathy:     Cervical: No cervical adenopathy.  Skin:    General: Skin is warm and dry.     Findings: No rash.  Neurological:     Mental Status: He is alert and oriented to person, place, and time.     Cranial Nerves: No cranial nerve deficit.      Motor: No abnormal muscle tone.     Coordination: Coordination normal.     Gait: Gait normal.     Deep Tendon Reflexes: Reflexes are normal and symmetric.  Psychiatric:        Behavior: Behavior normal.        Thought Content: Thought content normal.        Judgment: Judgment normal.   Lower abd bruise RUQ mass vs liver edge No rebound Slow walk No rash Wounds - clean  Lab Results  Component Value Date   WBC 5.7 12/18/2020   HGB 9.2 (L) 12/18/2020   HCT 27.3 (L) 12/18/2020   PLT 226 12/18/2020   GLUCOSE 104 (H) 12/18/2020   CHOL 140 04/01/2020   TRIG 96.0 04/01/2020   HDL 58.80 04/01/2020   LDLCALC 62 04/01/2020   ALT 21 12/07/2020   AST 31 12/07/2020   NA 133 (L) 12/18/2020   K 3.7 12/18/2020   CL 100 12/18/2020   CREATININE 0.93 12/18/2020   BUN 8 12/18/2020   CO2 25 12/18/2020   TSH 11.11 (H) 07/01/2020   PSA 0.87 10/20/2014   INR 4.9 (A) 12/26/2020   HGBA1C 5.3 12/03/2020    CT Abdomen Pelvis Wo Contrast  Addendum Date: 12/02/2020   ADDENDUM REPORT: 12/02/2020 16:30 ADDENDUM: The original report was by Dr. Van Clines. The following addendum is by Dr. Van Clines: These results were called by telephone at the time of interpretation on 12/02/2020 at 4:24 pm to provider Dr. Jeralyn Ruths , who verbally acknowledged these results. Electronically Signed   By: Van Clines M.D.   On: 12/02/2020 16:30   Result Date: 12/02/2020 CLINICAL DATA:  Right lower quadrant abdominal pain. Reduced appetite. Low-grade fever. EXAM: CT ABDOMEN AND PELVIS WITHOUT CONTRAST TECHNIQUE: Multidetector CT imaging of the abdomen and pelvis was performed following the standard protocol without IV contrast. COMPARISON:  09/23/2015 and hepatobiliary ultrasound of 04/26/2020 FINDINGS: Lower chest: Mitral valve prosthesis. Atherosclerotic calcification involving the descending thoracic aorta and right coronary artery. Mild cardiomegaly. Hepatobiliary: Nodular contour the liver compatible  with cirrhosis. No noncontrast CT evidence of hepatic mass. Cholecystectomy. No biliary dilatation identified. Pancreas: Unremarkable Spleen: Unremarkable Adrenals/Urinary Tract: Stable scarring of the right kidney upper pole. No hydronephrosis or renal calculi identified. The adrenal glands appear  unremarkable. Urinary bladder unremarkable. Stomach/Bowel: Acute appendicitis is present, with an appendicolith along the appendiceal orifice; appendiceal diameter 1.3 cm, and periappendiceal inflammatory stranding. No abnormal extraluminal gas or abscess. Vascular/Lymphatic: Atherosclerosis is present, including aortoiliac atherosclerotic disease. Reproductive: Unremarkable Other: No supplemental non-categorized findings. Musculoskeletal: Mild lumbar spondylosis. IMPRESSION: 1. Acute appendicitis, appendiceal diameter of up to 1.3 cm; appendicolith the appendiceal orifice; and Peri appendiceal inflammatory stranding. No extraluminal gas or abscess is currently identified. 2. Systemic and aortic Atherosclerosis (ICD10-I70.0). Coronary atherosclerosis. 3. Hepatic cirrhosis. Radiology assistant personnel have been notified to put me in telephone contact with the referring physician or the referring physician's clinical representative in order to discuss these findings. Once this communication is established I will issue an addendum to this report for documentation purposes. Electronically Signed: By: Van Clines M.D. On: 12/02/2020 16:13    Assessment & Plan:   Problem List Items Addressed This Visit     Abdominal pain - Primary    R/o hematoma, abscess, liver pathology in the R 1/2 of abdomen.  CT abd/pelvis CT today CBC, INR, CMET, UA, lipase      Relevant Orders   CT Abdomen Pelvis W Contrast   Comprehensive metabolic panel   CBC with Differential/Platelet   Urinalysis   Protime-INR   Lipase   Acute appendicitis    S/p recent surgery Check CBC, abd CT      Relevant Orders   Protime-INR    Anemia    Check CBC - r/o more blood loss      Relevant Orders   Comprehensive metabolic panel   CBC with Differential/Platelet   Protime-INR   Cirrhosis of liver without ascites (HCC)    Check LFTs, INR Abd CT      Relevant Orders   Comprehensive metabolic panel      Follow-up: Return in about 1 week (around 01/04/2021) for a follow-up visit.  Walker Kehr, MD

## 2020-12-28 NOTE — Patient Instructions (Signed)
Description   Called and spoke to pt and instructed him to hold warfarin tomorrow. Recheck INR on Friday. Coumadin Clinic [phone].

## 2020-12-28 NOTE — Assessment & Plan Note (Signed)
Check CBC - r/o more blood loss

## 2020-12-29 ENCOUNTER — Ambulatory Visit
Admission: RE | Admit: 2020-12-29 | Discharge: 2020-12-29 | Disposition: A | Discharge status: Home / Self Care | Payer: Medicare Other | Source: Ambulatory Visit | Attending: Internal Medicine | Admitting: Internal Medicine

## 2020-12-29 ENCOUNTER — Inpatient Hospital Stay (HOSPITAL_COMMUNITY)
Admission: EM | Admit: 2020-12-29 | Discharge: 2021-01-03 | DRG: 862 | Disposition: A | Discharge status: Home / Self Care | Payer: Medicare Other | Attending: Internal Medicine | Admitting: Internal Medicine

## 2020-12-29 ENCOUNTER — Telehealth: Payer: Self-pay

## 2020-12-29 ENCOUNTER — Telehealth: Payer: Self-pay | Admitting: *Deleted

## 2020-12-29 ENCOUNTER — Other Ambulatory Visit: Payer: Self-pay

## 2020-12-29 ENCOUNTER — Inpatient Hospital Stay (HOSPITAL_COMMUNITY): Payer: Medicare Other

## 2020-12-29 ENCOUNTER — Encounter (HOSPITAL_COMMUNITY): Payer: Self-pay | Admitting: Emergency Medicine

## 2020-12-29 DIAGNOSIS — I5032 Chronic diastolic (congestive) heart failure: Secondary | ICD-10-CM | POA: Diagnosis not present

## 2020-12-29 DIAGNOSIS — Z8673 Personal history of transient ischemic attack (TIA), and cerebral infarction without residual deficits: Secondary | ICD-10-CM

## 2020-12-29 DIAGNOSIS — I272 Pulmonary hypertension, unspecified: Secondary | ICD-10-CM | POA: Diagnosis not present

## 2020-12-29 DIAGNOSIS — M109 Gout, unspecified: Secondary | ICD-10-CM | POA: Diagnosis present

## 2020-12-29 DIAGNOSIS — I4821 Permanent atrial fibrillation: Secondary | ICD-10-CM | POA: Diagnosis not present

## 2020-12-29 DIAGNOSIS — M1000 Idiopathic gout, unspecified site: Secondary | ICD-10-CM

## 2020-12-29 DIAGNOSIS — R6889 Other general symptoms and signs: Secondary | ICD-10-CM | POA: Diagnosis not present

## 2020-12-29 DIAGNOSIS — E785 Hyperlipidemia, unspecified: Secondary | ICD-10-CM | POA: Diagnosis present

## 2020-12-29 DIAGNOSIS — I130 Hypertensive heart and chronic kidney disease with heart failure and stage 1 through stage 4 chronic kidney disease, or unspecified chronic kidney disease: Secondary | ICD-10-CM | POA: Diagnosis present

## 2020-12-29 DIAGNOSIS — K651 Peritoneal abscess: Secondary | ICD-10-CM | POA: Diagnosis not present

## 2020-12-29 DIAGNOSIS — E871 Hypo-osmolality and hyponatremia: Secondary | ICD-10-CM | POA: Diagnosis not present

## 2020-12-29 DIAGNOSIS — Z8249 Family history of ischemic heart disease and other diseases of the circulatory system: Secondary | ICD-10-CM

## 2020-12-29 DIAGNOSIS — Z79899 Other long term (current) drug therapy: Secondary | ICD-10-CM

## 2020-12-29 DIAGNOSIS — Z7901 Long term (current) use of anticoagulants: Secondary | ICD-10-CM | POA: Diagnosis not present

## 2020-12-29 DIAGNOSIS — K91870 Postprocedural hematoma of a digestive system organ or structure following a digestive system procedure: Secondary | ICD-10-CM | POA: Diagnosis not present

## 2020-12-29 DIAGNOSIS — I517 Cardiomegaly: Secondary | ICD-10-CM | POA: Diagnosis not present

## 2020-12-29 DIAGNOSIS — L02211 Cutaneous abscess of abdominal wall: Secondary | ICD-10-CM | POA: Diagnosis not present

## 2020-12-29 DIAGNOSIS — Z888 Allergy status to other drugs, medicaments and biological substances status: Secondary | ICD-10-CM | POA: Diagnosis not present

## 2020-12-29 DIAGNOSIS — Y838 Other surgical procedures as the cause of abnormal reaction of the patient, or of later complication, without mention of misadventure at the time of the procedure: Secondary | ICD-10-CM | POA: Diagnosis present

## 2020-12-29 DIAGNOSIS — I2510 Atherosclerotic heart disease of native coronary artery without angina pectoris: Secondary | ICD-10-CM | POA: Diagnosis present

## 2020-12-29 DIAGNOSIS — I10 Essential (primary) hypertension: Secondary | ICD-10-CM | POA: Diagnosis present

## 2020-12-29 DIAGNOSIS — Z952 Presence of prosthetic heart valve: Secondary | ICD-10-CM | POA: Diagnosis not present

## 2020-12-29 DIAGNOSIS — R791 Abnormal coagulation profile: Secondary | ICD-10-CM | POA: Diagnosis present

## 2020-12-29 DIAGNOSIS — K7460 Unspecified cirrhosis of liver: Secondary | ICD-10-CM | POA: Diagnosis not present

## 2020-12-29 DIAGNOSIS — N1831 Chronic kidney disease, stage 3a: Secondary | ICD-10-CM | POA: Diagnosis present

## 2020-12-29 DIAGNOSIS — T8149XA Infection following a procedure, other surgical site, initial encounter: Secondary | ICD-10-CM | POA: Diagnosis not present

## 2020-12-29 DIAGNOSIS — Z85828 Personal history of other malignant neoplasm of skin: Secondary | ICD-10-CM

## 2020-12-29 DIAGNOSIS — Z6824 Body mass index (BMI) 24.0-24.9, adult: Secondary | ICD-10-CM

## 2020-12-29 DIAGNOSIS — R630 Anorexia: Secondary | ICD-10-CM | POA: Diagnosis present

## 2020-12-29 DIAGNOSIS — B9620 Unspecified Escherichia coli [E. coli] as the cause of diseases classified elsewhere: Secondary | ICD-10-CM | POA: Diagnosis not present

## 2020-12-29 DIAGNOSIS — Z9889 Other specified postprocedural states: Secondary | ICD-10-CM | POA: Diagnosis not present

## 2020-12-29 DIAGNOSIS — Y828 Other medical devices associated with adverse incidents: Secondary | ICD-10-CM | POA: Diagnosis present

## 2020-12-29 DIAGNOSIS — R1084 Generalized abdominal pain: Secondary | ICD-10-CM

## 2020-12-29 DIAGNOSIS — I480 Paroxysmal atrial fibrillation: Secondary | ICD-10-CM | POA: Diagnosis present

## 2020-12-29 DIAGNOSIS — D509 Iron deficiency anemia, unspecified: Secondary | ICD-10-CM | POA: Diagnosis present

## 2020-12-29 DIAGNOSIS — Z882 Allergy status to sulfonamides status: Secondary | ICD-10-CM

## 2020-12-29 DIAGNOSIS — E876 Hypokalemia: Secondary | ICD-10-CM | POA: Diagnosis present

## 2020-12-29 DIAGNOSIS — R509 Fever, unspecified: Secondary | ICD-10-CM | POA: Diagnosis not present

## 2020-12-29 DIAGNOSIS — E1122 Type 2 diabetes mellitus with diabetic chronic kidney disease: Secondary | ICD-10-CM | POA: Diagnosis not present

## 2020-12-29 DIAGNOSIS — T8141XA Infection following a procedure, superficial incisional surgical site, initial encounter: Secondary | ICD-10-CM | POA: Diagnosis not present

## 2020-12-29 DIAGNOSIS — Z7982 Long term (current) use of aspirin: Secondary | ICD-10-CM

## 2020-12-29 DIAGNOSIS — Z20822 Contact with and (suspected) exposure to covid-19: Secondary | ICD-10-CM | POA: Diagnosis not present

## 2020-12-29 DIAGNOSIS — I4891 Unspecified atrial fibrillation: Secondary | ICD-10-CM | POA: Diagnosis present

## 2020-12-29 DIAGNOSIS — T8143XA Infection following a procedure, organ and space surgical site, initial encounter: Secondary | ICD-10-CM | POA: Diagnosis not present

## 2020-12-29 DIAGNOSIS — L0291 Cutaneous abscess, unspecified: Secondary | ICD-10-CM | POA: Diagnosis not present

## 2020-12-29 DIAGNOSIS — K219 Gastro-esophageal reflux disease without esophagitis: Secondary | ICD-10-CM | POA: Diagnosis present

## 2020-12-29 DIAGNOSIS — R109 Unspecified abdominal pain: Secondary | ICD-10-CM

## 2020-12-29 DIAGNOSIS — Z833 Family history of diabetes mellitus: Secondary | ICD-10-CM | POA: Diagnosis not present

## 2020-12-29 DIAGNOSIS — R1013 Epigastric pain: Secondary | ICD-10-CM | POA: Diagnosis not present

## 2020-12-29 DIAGNOSIS — I700 Atherosclerosis of aorta: Secondary | ICD-10-CM | POA: Diagnosis not present

## 2020-12-29 LAB — HEMOGLOBIN AND HEMATOCRIT, BLOOD
HCT: 27 % — ABNORMAL LOW (ref 39.0–52.0)
Hemoglobin: 8.7 g/dL — ABNORMAL LOW (ref 13.0–17.0)

## 2020-12-29 LAB — COMPREHENSIVE METABOLIC PANEL
ALT: 31 U/L (ref 0–44)
AST: 50 U/L — ABNORMAL HIGH (ref 15–41)
Albumin: 2.7 g/dL — ABNORMAL LOW (ref 3.5–5.0)
Alkaline Phosphatase: 107 U/L (ref 38–126)
Anion gap: 10 (ref 5–15)
BUN: 16 mg/dL (ref 8–23)
CO2: 23 mmol/L (ref 22–32)
Calcium: 8.2 mg/dL — ABNORMAL LOW (ref 8.9–10.3)
Chloride: 92 mmol/L — ABNORMAL LOW (ref 98–111)
Creatinine, Ser: 1.14 mg/dL (ref 0.61–1.24)
GFR, Estimated: >60 mL/min (ref >60)
Glucose, Bld: 144 mg/dL — ABNORMAL HIGH (ref 70–99)
Potassium: 3.5 mmol/L (ref 3.5–5.1)
Sodium: 125 mmol/L — ABNORMAL LOW (ref 135–145)
Total Bilirubin: 1 mg/dL (ref 0.3–1.2)
Total Protein: 6 g/dL — ABNORMAL LOW (ref 6.5–8.1)

## 2020-12-29 LAB — RESP PANEL BY RT-PCR (FLU A&B, COVID) ARPGX2
Influenza A by PCR: NEGATIVE
Influenza B by PCR: NEGATIVE
SARS Coronavirus 2 by RT PCR: NEGATIVE

## 2020-12-29 LAB — CBC
HCT: 28.4 % — ABNORMAL LOW (ref 39.0–52.0)
Hemoglobin: 9.5 g/dL — ABNORMAL LOW (ref 13.0–17.0)
MCH: 31.8 pg (ref 26.0–34.0)
MCHC: 33.5 g/dL (ref 30.0–36.0)
MCV: 95 fL (ref 80.0–100.0)
Platelets: 359 10*3/uL (ref 150–400)
RBC: 2.99 MIL/uL — ABNORMAL LOW (ref 4.22–5.81)
RDW: 14.8 % (ref 11.5–15.5)
WBC: 7.8 10*3/uL (ref 4.0–10.5)
nRBC: 0 % (ref 0.0–0.2)

## 2020-12-29 LAB — PROTIME-INR
INR: 4.3 (ref 0.8–1.2)
Prothrombin Time: 40.9 seconds — ABNORMAL HIGH (ref 11.4–15.2)

## 2020-12-29 MED ORDER — PHYTONADIONE 5 MG PO TABS
2.5000 mg | ORAL_TABLET | Freq: Once | ORAL | Status: AC
  Administered 2020-12-29: 2.5 mg via ORAL
  Filled 2020-12-29: qty 1

## 2020-12-29 MED ORDER — BISOPROLOL FUMARATE 5 MG PO TABS
2.5000 mg | ORAL_TABLET | Freq: Every day | ORAL | Status: DC
  Administered 2020-12-29 – 2021-01-02 (×5): 2.5 mg via ORAL
  Filled 2020-12-29 (×3): qty 1
  Filled 2020-12-29: qty 0.5
  Filled 2020-12-29: qty 1

## 2020-12-29 MED ORDER — MAGNESIUM OXIDE 400 MG PO TABS
400.0000 mg | ORAL_TABLET | Freq: Two times a day (BID) | ORAL | Status: DC
  Filled 2020-12-29: qty 1

## 2020-12-29 MED ORDER — SODIUM CHLORIDE 0.9% FLUSH
3.0000 mL | Freq: Two times a day (BID) | INTRAVENOUS | Status: DC
  Administered 2020-12-29 – 2021-01-03 (×7): 3 mL via INTRAVENOUS

## 2020-12-29 MED ORDER — PANTOPRAZOLE SODIUM 40 MG PO TBEC
40.0000 mg | DELAYED_RELEASE_TABLET | Freq: Every day | ORAL | Status: DC
  Administered 2020-12-30 – 2021-01-03 (×5): 40 mg via ORAL
  Filled 2020-12-29 (×5): qty 1

## 2020-12-29 MED ORDER — ONDANSETRON HCL 4 MG PO TABS: 4.0000 mg | ORAL_TABLET | Freq: Four times a day (QID) | ORAL | Status: DC | PRN

## 2020-12-29 MED ORDER — PIPERACILLIN-TAZOBACTAM 3.375 G IVPB 30 MIN: 3.3750 g | Freq: Once | INTRAVENOUS | Status: DC

## 2020-12-29 MED ORDER — LATANOPROST 0.005 % OP SOLN
1.0000 [drp] | Freq: Every day | OPHTHALMIC | Status: DC
  Administered 2020-12-29 – 2021-01-02 (×4): 1 [drp] via OPHTHALMIC
  Filled 2020-12-29 (×2): qty 2.5

## 2020-12-29 MED ORDER — SODIUM CHLORIDE 0.9 % IV BOLUS
1000.0000 mL | Freq: Once | INTRAVENOUS | Status: AC
  Administered 2020-12-29: 1000 mL via INTRAVENOUS

## 2020-12-29 MED ORDER — ACETAMINOPHEN 650 MG RE SUPP: 650.0000 mg | Freq: Four times a day (QID) | RECTAL | Status: DC | PRN

## 2020-12-29 MED ORDER — PIPERACILLIN-TAZOBACTAM 3.375 G IVPB
3.3750 g | Freq: Three times a day (TID) | INTRAVENOUS | Status: DC
  Administered 2020-12-29 – 2021-01-03 (×14): 3.375 g via INTRAVENOUS
  Filled 2020-12-29 (×15): qty 50

## 2020-12-29 MED ORDER — ACETAMINOPHEN 325 MG PO TABS
650.0000 mg | ORAL_TABLET | Freq: Four times a day (QID) | ORAL | Status: DC | PRN
  Administered 2020-12-31: 650 mg via ORAL
  Filled 2020-12-29: qty 2

## 2020-12-29 MED ORDER — SODIUM CHLORIDE 0.9 % IV SOLN: Freq: Once | INTRAVENOUS | Status: AC

## 2020-12-29 MED ORDER — ALLOPURINOL 300 MG PO TABS
300.0000 mg | ORAL_TABLET | Freq: Every day | ORAL | Status: DC
  Administered 2020-12-30 – 2021-01-03 (×5): 300 mg via ORAL
  Filled 2020-12-29 (×6): qty 1

## 2020-12-29 MED ORDER — IOPAMIDOL (ISOVUE-300) INJECTION 61%
100.0000 mL | Freq: Once | INTRAVENOUS | Status: AC | PRN
  Administered 2020-12-29: 100 mL via INTRAVENOUS

## 2020-12-29 MED ORDER — ALBUTEROL SULFATE (2.5 MG/3ML) 0.083% IN NEBU: 2.5000 mg | INHALATION_SOLUTION | Freq: Four times a day (QID) | RESPIRATORY_TRACT | Status: DC | PRN

## 2020-12-29 MED ORDER — MAGNESIUM OXIDE -MG SUPPLEMENT 400 (240 MG) MG PO TABS
400.0000 mg | ORAL_TABLET | Freq: Two times a day (BID) | ORAL | Status: DC
  Administered 2020-12-29 – 2021-01-02 (×9): 400 mg via ORAL
  Filled 2020-12-29 (×9): qty 1

## 2020-12-29 MED ORDER — ENALAPRIL MALEATE 5 MG PO TABS
20.0000 mg | ORAL_TABLET | Freq: Every day | ORAL | Status: DC
  Administered 2020-12-30 – 2021-01-03 (×5): 20 mg via ORAL
  Filled 2020-12-29 (×4): qty 4
  Filled 2020-12-29: qty 1
  Filled 2020-12-29 (×2): qty 4

## 2020-12-29 MED ORDER — AMLODIPINE BESYLATE 5 MG PO TABS
5.0000 mg | ORAL_TABLET | Freq: Every morning | ORAL | Status: DC
  Administered 2020-12-30 – 2021-01-03 (×5): 5 mg via ORAL
  Filled 2020-12-29 (×5): qty 1

## 2020-12-29 MED ORDER — ATORVASTATIN CALCIUM 10 MG PO TABS
20.0000 mg | ORAL_TABLET | ORAL | Status: DC
  Administered 2020-12-30 – 2021-01-02 (×2): 20 mg via ORAL
  Filled 2020-12-29 (×2): qty 2

## 2020-12-29 MED ORDER — ENSURE ENLIVE PO LIQD
237.0000 mL | Freq: Two times a day (BID) | ORAL | Status: DC
  Administered 2021-01-01 – 2021-01-03 (×4): 237 mL via ORAL

## 2020-12-29 MED ORDER — ASPIRIN EC 81 MG PO TBEC: 81.0000 mg | DELAYED_RELEASE_TABLET | Freq: Every day | ORAL | Status: DC

## 2020-12-29 MED ORDER — ONDANSETRON HCL 4 MG/2ML IJ SOLN: 4.0000 mg | Freq: Four times a day (QID) | INTRAMUSCULAR | Status: DC | PRN

## 2020-12-29 NOTE — Telephone Encounter (Signed)
CRITICAL VALUE STICKER  CRITICAL VALUE: RT LQ ABCESS  RECEIVER (on-site recipient of call): [place] NOTIFIED: 12/29/2020, 9:28am  MESSENGER (representative from [employer] [facility]): Marzetta Board  MD NOTIFIED: Yes  TIME OF NOTIFICATION: 9:30am  RESPONSE: Provider will contact pt.

## 2020-12-29 NOTE — Telephone Encounter (Signed)
Error

## 2020-12-29 NOTE — ED Notes (Signed)
Provider at bedside

## 2020-12-29 NOTE — H&P (Signed)
History and Physical    Anthony Gould QSX:[id number] DOB: 1933-03-30 DOA: 12/29/2020  Referring MD/NP/PA: Deno Etienne, MD PCP: Cassandria Anger, MD  Patient coming from: Home  Chief Complaint: Abdominal soreness  I have personally briefly reviewed patient's old medical records in [place]   HPI: Anthony Gould is a 87 y.o. male with medical history significant of permanent atrial fibrillation on chronic anticoagulation, mechanical mitral valve replacement 1986, hypertension, hyperlipidemia, and chronic who presents with complaints of right lower quadrant abdominal soreness.  He had just recently been hospitalized from 9/9-9/25 after presenting complaints of abdominal soreness found to have acute appendicitis.  Patient underwent appendectomy on 9/12 and postoperative course was complicated by bleeding requiring patient to be transfused 2 units of packed red blood cells.  Patient reports that even before being discharged from hospital he continued to have the right lower quadrant abdominal soreness with tenderness to palpation.  He reports that he has constantly been nauseous with poor p.o. intake and generalized weakness.  Denies having any significant fevers, redness around surgical site, drainage from the surgical wound, or shortness of breath.  He had continued to have normal bowel movements and denies seeing any blood present.  He had a posthospital follow-up with his primary care provider yesterday and due to his symptoms was sent for CT scanning this morning.  CT scan of the abdomen and pelvis revealed a 7.7 x 6.6 x 6.4 rim-enhancing abscess in the right lower quadrant and small bilateral pleural effusions with overlying atelectasis for which she was advised to come to the emergency department for further evaluation.    ED Course: On admission to the emergency department patient was noted to be afebrile with vital signs relatively within normal limits.  Labs from 10/5 significant  for WBC 11.8, globin 10.4, platelets 425, sodium 130, BUN 15, creatinine 1.08, AST 42, lipase 8, and INR 4.4.  Urinalysis was relatively unremarkable.  Patient had been started on empiric antibiotics of Zosyn.  General surgery have been formally consulted and orders were placed for IR guided drainage.  TRH called to admit.  Review of Systems  Constitutional:  Positive for malaise/fatigue. Negative for fever.  HENT:  Negative for nosebleeds.   Eyes:  Negative for photophobia and pain.  Respiratory:  Negative for shortness of breath.   Cardiovascular:  Negative for chest pain and leg swelling.  Gastrointestinal:  Positive for abdominal pain, diarrhea and nausea. Negative for blood in stool.  Genitourinary:  Negative for frequency.  Neurological:  Positive for weakness. Negative for focal weakness.  Psychiatric/Behavioral:  Negative for substance abuse.    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   NSVT (nonsustained ventricular tachycardia)    Peripheral edema    venous insufficiency   Permanent atrial fibrillation (HCC)    Pulmonary hypertension (HCC)    Syncope    TIA (transient ischemic attack)    while on coumadin   Type II or unspecified type diabetes mellitus without mention of complication, not stated as uncontrolled    lifestyle mangement   Ventricular ectopy    symptomatic    Past Surgical History:  Procedure Laterality Date   CHOLECYSTECTOMY, LAPAROSCOPIC  2003   LAPAROSCOPIC APPENDECTOMY N/A 12/05/2020  Procedure: APPENDECTOMY LAPAROSCOPIC;  Surgeon: Stark Klein, MD;  Location: [facility];  Service: General;  Laterality: N/A;   MITRAL VALVE REPLACEMENT     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     reports that he has never smoked. He has never used  smokeless tobacco. He reports that he does not drink alcohol and does not use drugs.  Allergies  Allergen Reactions   Diltiazem Other (See Comments)    "Bradycardia," per pt; tolerates Amlodipine   Sulfa Antibiotics         Sulfonamide Derivatives     Family History  Problem Relation Age of Onset   Coronary artery disease Father    Diabetes Other    Coronary artery disease Other    Prostate cancer Neg Hx    Colon cancer Neg Hx     Prior to Admission medications   Medication Sig Start Date End Date Taking? Authorizing Provider  allopurinol (ZYLOPRIM) 300 MG tablet TAKE 1 TABLET BY MOUTH DAILY AS NEEDED. GENERIC EQUIVALENT FOR ZYLOPRIM 04/14/20   Plotnikov, Evie Lacks, MD  amLODipine (NORVASC) 5 MG tablet TAKE 1/2 TABLET BY MOUTH EVERY DAY FOR 3 DAYS,IF BLOOD PRESSURE IS STILL HIGHER THAN 130 THEN INCREASE TO 1 TABLET BY MOUTH DAILY Patient taking differently: Take 5 mg by mouth daily. 03/28/20   Dunn, Nedra Hai, [name]  aspirin 81 MG EC tablet Take 81 mg by mouth daily.    [provider]  atorvastatin (LIPITOR) 20 MG tablet TAKE 1 TABLET BY MOUTH TWO TIMES A WEEK Patient taking differently: Take 20 mg by mouth See admin instructions. TAKE 1 TABLET BY MOUTH TWO TIMES A WEEK 04/14/20   Plotnikov, Evie Lacks, MD  bisoprolol (ZEBETA) 5 MG tablet Take 0.5 tablets (2.5 mg total) by mouth daily. 12/08/19   Dunn, Nedra Hai, [name]  Cholecalciferol (VITAMIN D3) 50 MCG (2000 UT) capsule Take 1 capsule (2,000 Units total) by mouth daily. 10/01/19   Plotnikov, Evie Lacks, MD  dipyridamole (PERSANTINE) 50 MG tablet Take 1 tablet (50 mg total) by mouth 3 (three) times daily. 08/08/20   Burnell Blanks, MD  docusate sodium (COLACE) 100 MG capsule Take 1 capsule (100 mg total) by mouth 2 (two) times daily. 12/18/20   Lorella Nimrod, MD  enalapril (VASOTEC) 20 MG tablet TAKE 1 TABLET BY MOUTH DAILY. 08/07/20   Plotnikov, Evie Lacks, MD  ferrous [id number] C-folic acid (TRINSICON / FOLTRIN)  capsule Take 1 capsule by mouth 2 (two) times daily after a meal. 12/18/20   Lorella Nimrod, MD  furosemide (LASIX) 40 MG tablet TAKE 1 TABLET BY MOUTH DAILY GENERIC EQUIVALENT FOR LASIX Patient taking differently: Take 40 mg by mouth in the morning. 07/04/20   Plotnikov, Evie Lacks, MD  latanoprost (XALATAN) 0.005 % ophthalmic solution Place 1 drop into both eyes at bedtime.    [provider]  magnesium oxide (MAG-OX) 400 MG tablet TAKE 1 TABLET(400 MG) BY MOUTH TWICE DAILY Patient taking differently: Take 400 mg by mouth 2 (two) times daily. 04/14/20   Dunn, Nedra Hai, [name]  Milk Thistle 1000 MG CAPS Take 1 capsule by mouth daily.    [provider]  pantoprazole (PROTONIX) 40 MG tablet TAKE 1 TABLET BY MOUTH DAILY Patient taking differently: Take 40 mg by mouth in the morning. 08/07/20   Plotnikov, Evie Lacks, MD  warfarin (COUMADIN) 5 MG tablet TAKE 1 TABLET BY MOUTH AS DIRECTED BY THE COUMADIN CLINIC Patient taking differently: Take 5 mg by mouth in the  morning. Take 1 tablet by mouth as directed by the coumadin clinic 06/27/20   Burnell Blanks, MD    Physical Exam:  Constitutional: Elderly male who appears in no acute distress at this time Vitals:   12/29/20 1448 12/29/20 1449  BP:  125/64  Pulse:  88  Resp:  20  Temp:  (!) 97.5 F (36.4 C)  TempSrc:  Oral  SpO2:  98%  Weight: 77.1 kg   Height: _0  (1.778 m)    Eyes: PERRL, lids and conjunctivae normal ENMT: Mucous membranes are dry.  Posterior pharynx clear of any exudate or lesions. Neck: normal, supple, no masses, no thyromegaly Respiratory: clear to auscultation bilaterally, no wheezing, no crackles. Normal respiratory effort. No accessory muscle use.  Cardiovascular: Irregular with positive systolic murmur appreciated.  No significant lower extremity swelling. Abdomen: no tenderness, no masses palpated. No hepatosplenomegaly. Bowel sounds positive.  Musculoskeletal: no clubbing / cyanosis. No joint  deformity upper and lower extremities. Good ROM, no contractures. Normal muscle tone.  Skin: no rashes, lesions, ulcers. No induration Neurologic: CN 2-12 grossly intact. Sensation intact, DTR normal. Strength 5/5 in all 4.  Psychiatric: Normal judgment and insight. Alert and oriented x 3. Normal mood.     Labs on Admission: I have personally reviewed following labs and imaging studies  CBC: Recent Labs  Lab 12/28/20 0840  WBC 11.8*  NEUTROABS 10.3*  HGB 10.4*  HCT 31.5*  MCV 94.9  PLT 563.8*   Basic Metabolic Panel: Recent Labs  Lab 12/28/20 0840  NA 130*  K 3.7  CL 93*  CO2 27  GLUCOSE 108*  BUN 15  CREATININE 1.08  CALCIUM 8.8   GFR: Estimated Creatinine Clearance: 51.6 mL/min (by C-G formula based on SCr of 1.08 mg/dL). Liver Function Tests: Recent Labs  Lab 12/28/20 0840  AST 42*  ALT 28  ALKPHOS 106  BILITOT 1.2  PROT 6.5  ALBUMIN 3.2*   Recent Labs  Lab 12/28/20 0840  LIPASE 8.0*   No results for input(s): AMMONIA in the last 168 hours. Coagulation Profile: Recent Labs  Lab 12/23/20 0000 12/23/20 1045 12/26/20 1419 12/28/20 0840  INR 8.3* 8.3* 4.9* 4.4*   Cardiac Enzymes: No results for input(s): CKTOTAL, CKMB, CKMBINDEX, TROPONINI in the last 168 hours. BNP (last 3 results) No results for input(s): PROBNP in the last 8760 hours. HbA1C: No results for input(s): HGBA1C in the last 72 hours. CBG: No results for input(s): GLUCAP in the last 168 hours. Lipid Profile: No results for input(s): CHOL, HDL, LDLCALC, TRIG, CHOLHDL, LDLDIRECT in the last 72 hours. Thyroid Function Tests: No results for input(s): TSH, T4TOTAL, FREET4, T3FREE, THYROIDAB in the last 72 hours. Anemia Panel: No results for input(s): VITAMINB12, FOLATE, FERRITIN, TIBC, IRON, RETICCTPCT in the last 72 hours. Urine analysis:    Component Value Date/Time   COLORURINE YELLOW 12/28/2020 0840   APPEARANCEUR CLEAR 12/28/2020 0840   LABSPEC 1.010 12/28/2020 0840    PHURINE 6.0 12/28/2020 0840   GLUCOSEU NEGATIVE 12/28/2020 0840   HGBUR NEGATIVE 12/28/2020 0840   BILIRUBINUR SMALL (A) 12/28/2020 0840   KETONESUR NEGATIVE 12/28/2020 0840   PROTEINUR 30 (A) 08/07/2013 1633   UROBILINOGEN 0.2 12/28/2020 0840   NITRITE NEGATIVE 12/28/2020 0840   LEUKOCYTESUR NEGATIVE 12/28/2020 0840   Sepsis Labs: No results found for this or any previous visit (from the past 240 hour(s)).   Radiological Exams on Admission: CT Abdomen Pelvis W Contrast  Result Date: 12/29/2020 CLINICAL DATA:  History of appendectomy  12/05/2020. Abdominal pain and fever. EXAM: CT ABDOMEN AND PELVIS WITH CONTRAST TECHNIQUE: Multidetector CT imaging of the abdomen and pelvis was performed using the standard protocol following bolus administration of intravenous contrast. CONTRAST:  157m ISOVUE-300 IOPAMIDOL (ISOVUE-300) INJECTION 61% COMPARISON:  CT scan 12/02/2020 FINDINGS: Lower chest: Borderline cardiac enlargement but no pericardial effusion. Stable aortic calcifications. Prosthetic mitral valve is noted. Small bilateral pleural effusions with overlying atelectasis. Hepatobiliary: Stable advanced cirrhotic changes involving liver but no hepatic lesions are identified. No intrahepatic biliary dilatation. The gallbladder is surgically absent. No common bile duct dilatation. Pancreas: No mass, inflammation or ductal dilatation. Spleen: Normal size.  No focal lesions. Adrenals/Urinary Tract: Adrenal glands and kidneys are unremarkable stable. No worrisome renal lesions or hydronephrosis. The bladder is decompressed. No significant abnormality. Stomach/Bowel: The stomach, duodenum, small bowel and colon are unremarkable. No acute inflammatory process, mass lesions or obstructive findings. Vascular/Lymphatic: Stable atherosclerotic calcifications involving the aorta and iliac arteries but no aneurysm dissection. Branch vessels are patent. Major venous structures are patent. Reproductive: The prostate  gland and seminal vesicles are unremarkable. Other: There is a rim enhancing abscess in the right lower quadrant measuring approximately 7.7 x 6.6 x 6.4 cm. This is just inferior to the cecum and adjacent to the terminal ileum. Some surrounding inflammatory phlegmon. There is also a small amount non rim enhancing fluid in the pelvis but no discrete pelvic abscess. Musculoskeletal: No significant bony findings. Chronic calcific iliopsoas tendinopathy on the right side. IMPRESSION: 1. 7.7 x 6.6 x 6.4 cm rim enhancing abscess in the right lower quadrant. 2. Stable advanced cirrhotic changes involving liver but no hepatic lesions. 3. Small bilateral pleural effusions with overlying atelectasis. 4. Status post cholecystectomy. No biliary dilatation. These results will be called to the ordering clinician or representative by the Radiologist Assistant, and communication documented in the PACS or [employer] Aortic Atherosclerosis (ICD10-I70.0). Electronically Signed   By: PMarijo SanesM.D.   On: 12/29/2020 09:06    EKG: Independently reviewed.  Atrial fibrillation rate 90 bpm  Assessment/Plan Postoperative abscess: Acute.  Patient presents with complaints of continued right lower quadrant abdominal pain he describes as soreness unchanged from when he initially presented with appendicitis s/p appendectomy on 9/12.  CT scan of the abdomen revealed 7.7 x 6.6 x 6.4 rim-enhancing abscess in the right lower quadrant.  Patient has been started on empiric antibiotics of Zosyn.  Case had been discussed with general surgery who recommended CT-guided drain placement.  -Admit to a telemetry bed -Follow-up blood culture -N.p.o. after midnight for possible CT-guided drain placement in a.m. -Gentle IV fluids at 50 mL/h -Continue empiric antibiotics of Zosyn  Supratherapeutic INR mechanical mitral valve permanent atrial fibrillation: Patient found to be in atrial fibrillation but rate controlled.  INR was noted to be  elevated 4.4->4.3.  Coumadin currently on hold due to supratherapeutic INR. -Hold Coumadin, aspirin, and dipyridamole and resume when medically appropriate -Give vitamin K 2.5 mg -Goal INR between 2.5 and 3.5. -Heparin per pharmacy for bridging  Normocytic anemia: Acute on chronic.  Hemoglobin 10.4->9.5.  Patient's previous hospitalization was complicated by postoperative bleeding had tried to restart patient on anticoagulation.  Question possibility of dilutional effect because all 3 cell lines decreased. -Type and screen for possible need of blood products -Continue to monitor H&H -Transfuse blood products as needed for hemoglobin grams per deciliter  Leukocytosis: Resolved.  WBC 11.8->7.8.  Patient is otherwise afebrile with no other SIRS criteria met. -Recheck WBC tomorrow morning  Hyponatremia:  Acute.  Sodium level 130->125.  Patient reports decreased intake due to persistent nausea.  Suspect possibly hypovolemic hyponatremia. -Gentle IV fluids at 50 mL/h -Continue to monitor sodium levels.  Essential hypertension: Blood pressure currently maintained.  Blood pressure regimen includes bisoprolol 2.5 mg nightly, enalapril 20 mg daily, and amlodipine 5 mg every morning, and furosemide 40 mg daily. -Continue bisoprolol, enalapril, and amlodipine as tolerated -Initially held furosemide  Chronic diastolic congestive heart failure: Last EF noted to be around 55 to 60% back in 2020.  Patient does not appear fluid overloaded at this time. -Strict I&Os -Daily weights  Hyperlipidemia -Continue atorvastatin  Gout -Continue allopurinol  GERD -Continue Protonix  DVT prophylaxis: Coumadin on hold Code Status: Full Family Communication: None Disposition Plan: Hopefully discharge home once medically stable Consults called: none Admission status: Inpatient, require more than 2 midnight stay.  Norval Morton MD [facility]   If 7PM-7AM, please contact  night-coverage   12/29/2020, 3:46 PM

## 2020-12-29 NOTE — Telephone Encounter (Signed)
I spoke w/[name]. Informed of :   "CT IMPRESSION: 1. 7.7 x 6.6 x 6.4 cm rim enhancing abscess in the right lower quadrant. 2. Stable advanced cirrhotic changes involving liver but no hepatic lesions. 3. Small bilateral pleural effusions with overlying atelectasis. 4. Status post cholecystectomy. No biliary dilatation.   These results will be called to the ordering clinician or representative by the Radiologist Assistant, and communication documented in the PACS or [employer].   Aortic Atherosclerosis (ICD10-I70.0)."  Anthony Gould is very weak. I asked them to call ambulance and go to [facility] ER now. Will call ER triage.  Thanks,

## 2020-12-29 NOTE — ED Notes (Signed)
EDP at [facility] speaking with pt and family x2. Pt alert, NAD, calm, interactive, resps e/u, speaking in clear complete sentences.

## 2020-12-29 NOTE — ED Provider Notes (Addendum)
[facility] EMERGENCY DEPARTMENT Provider Note   CSN: [id number] Arrival date & time: 12/29/20  1444     History Chief Complaint  Patient presents with   Abscess    Anthony Gould is a 87 y.o. male.  Pt c/o rlq abd pain ever since being dx with appendicitis and have appendectomy 12/05/2020. Post op course complicated by some bleeding/bleeding into drain - pt w hx mitral valve surgery and anticoag therapy. States rlq pain, and decreased appetite has persisted - symptoms constant, dull, moderate, non radiating. +intermittent nausea. No vomiting. No distension. Is having bms. Indicates urinating normal amount. No dysuria. No back or flank pain. Low grad fever.  Had outpatient ct showing abscess and was advised to come to ED.  The history is provided by the patient, a relative, medical records and the EMS personnel.  Abscess Associated symptoms: fever and nausea   Associated symptoms: no headaches and no vomiting       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   NSVT (nonsustained ventricular tachycardia)    Peripheral edema    venous insufficiency   Permanent atrial fibrillation ([place])    Pulmonary hypertension ([place])    Syncope    TIA (transient ischemic attack)    while on coumadin   Type II or unspecified type diabetes mellitus without mention of complication, not stated as uncontrolled    lifestyle mangement   Ventricular ectopy    symptomatic    Patient Active Problem List   Diagnosis Date Noted   Anemia 12/28/2020   H/O mitral valve replacement with mechanical valve    Acute appendicitis 12/03/2020   Insomnia 09/27/2020   Grief at loss of child 09/01/2020   Right hip pain 08/10/2020   Elevated TSH 07/07/2020   CRI (chronic renal  insufficiency), stage 3 (moderate) ([place]) 04/08/2020   Neoplasm of uncertain behavior of skin 10/01/2019   Hypomagnesemia 04/02/2019   Syncope 03/05/2019   Abdominal pain 01/20/2019   Vitamin D deficiency 11/28/2018   Low back pain 11/26/2018   Skin cancer 11/26/2018   Melanoma in situ ([place]) 08/26/2018   Foot lesion 04/23/2018   Hematuria 12/18/2017   Night sweats 12/18/2017   Chronic diastolic heart failure ([place]) 09/16/2016   Food allergy 02/13/2016   Cirrhosis of liver without ascites ([place]) 10/12/2015   Nausea with vomiting 10/12/2015   Diarrhea 10/03/2015   Emesis 09/20/2015   Aspiration pneumonia ([place]) 09/03/2015   Food poisoning 09/02/2015   Hematemesis 09/02/2015   Cough 09/02/2015   Flank pain, acute 05/02/2015   Actinic keratoses 10/20/2014   NSVT (nonsustained ventricular tachycardia) ([place]) 02/16/2013   Routine health maintenance 02/06/2012   Paroxysmal atrial fibrillation ([place]) 09/06/2011   Long term (current) use of anticoagulants 07/04/2010   Gout 06/27/2010   Mitral valve disorder 06/06/2010   Chest pain 02/28/2010   TOXIC LABYRINTHITIS 08/04/2009   Hearing loss 07/07/2009   DYSPEPSIA, CHRONIC 06/23/2007   DM2 (diabetes mellitus, type 2) ([place]) 02/08/2007   Hyperlipidemia 01/31/2007   Essential hypertension 01/31/2007   Coronary atherosclerosis 01/31/2007   Atrial fibrillation ([place]) 01/31/2007   Cerebral artery occlusion with cerebral infarction ([place]) 01/31/2007   Transient cerebral ischemia 01/31/2007   MITRAL VALVE REPLACEMENT, HX OF 01/31/2007  Past Surgical History:  Procedure Laterality Date   CHOLECYSTECTOMY, LAPAROSCOPIC  2003   LAPAROSCOPIC APPENDECTOMY N/A 12/05/2020   Procedure: APPENDECTOMY LAPAROSCOPIC;  Surgeon: Stark Klein, MD;  Location: [facility];  Service: General;  Laterality: N/A;   MITRAL VALVE REPLACEMENT     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       Family History  Problem Relation Age of Onset   Coronary artery  disease Father    Diabetes Other    Coronary artery disease Other    Prostate cancer Neg Hx    Colon cancer Neg Hx     Social History   Tobacco Use   Smoking status: Never   Smokeless tobacco: Never  Substance Use Topics   Alcohol use: No    Alcohol/week: 0.0 standard drinks   Drug use: No    Home Medications Prior to Admission medications   Medication Sig Start Date End Date Taking? Authorizing Provider  allopurinol (ZYLOPRIM) 300 MG tablet TAKE 1 TABLET BY MOUTH DAILY AS NEEDED. GENERIC EQUIVALENT FOR ZYLOPRIM 04/14/20   Plotnikov, Evie Lacks, MD  amLODipine (NORVASC) 5 MG tablet TAKE 1/2 TABLET BY MOUTH EVERY DAY FOR 3 DAYS,IF BLOOD PRESSURE IS STILL HIGHER THAN 130 THEN INCREASE TO 1 TABLET BY MOUTH DAILY Patient taking differently: Take 5 mg by mouth daily. 03/28/20   Dunn, Nedra Hai, [name]  aspirin 81 MG EC tablet Take 81 mg by mouth daily.    [provider]  atorvastatin (LIPITOR) 20 MG tablet TAKE 1 TABLET BY MOUTH TWO TIMES A WEEK Patient taking differently: Take 20 mg by mouth See admin instructions. TAKE 1 TABLET BY MOUTH TWO TIMES A WEEK 04/14/20   Plotnikov, Evie Lacks, MD  bisoprolol (ZEBETA) 5 MG tablet Take 0.5 tablets (2.5 mg total) by mouth daily. 12/08/19   Dunn, Nedra Hai, [name]  Cholecalciferol (VITAMIN D3) 50 MCG (2000 UT) capsule Take 1 capsule (2,000 Units total) by mouth daily. 10/01/19   Plotnikov, Evie Lacks, MD  dipyridamole (PERSANTINE) 50 MG tablet Take 1 tablet (50 mg total) by mouth 3 (three) times daily. 08/08/20   Burnell Blanks, MD  docusate sodium (COLACE) 100 MG capsule Take 1 capsule (100 mg total) by mouth 2 (two) times daily. 12/18/20   Lorella Nimrod, MD  enalapril (VASOTEC) 20 MG tablet TAKE 1 TABLET BY MOUTH DAILY. 08/07/20   Plotnikov, Evie Lacks, MD  ferrous [id number] C-folic acid (TRINSICON / FOLTRIN) capsule Take 1 capsule by mouth 2 (two) times daily after a meal. 12/18/20   Lorella Nimrod, MD  furosemide (LASIX) 40 MG tablet  TAKE 1 TABLET BY MOUTH DAILY GENERIC EQUIVALENT FOR LASIX Patient taking differently: Take 40 mg by mouth in the morning. 07/04/20   Plotnikov, Evie Lacks, MD  latanoprost (XALATAN) 0.005 % ophthalmic solution Place 1 drop into both eyes at bedtime.    [provider]  magnesium oxide (MAG-OX) 400 MG tablet TAKE 1 TABLET(400 MG) BY MOUTH TWICE DAILY Patient taking differently: Take 400 mg by mouth 2 (two) times daily. 04/14/20   Dunn, Nedra Hai, [name]  Milk Thistle 1000 MG CAPS Take 1 capsule by mouth daily.    [provider]  pantoprazole (PROTONIX) 40 MG tablet TAKE 1 TABLET BY MOUTH DAILY Patient taking differently: Take 40 mg by mouth in the morning. 08/07/20   Plotnikov, Evie Lacks, MD  warfarin (COUMADIN) 5 MG tablet TAKE 1 TABLET BY MOUTH AS DIRECTED BY THE COUMADIN CLINIC Patient taking differently: Take 5 mg by  mouth in the morning. Take 1 tablet by mouth as directed by the coumadin clinic 06/27/20   Burnell Blanks, MD    Allergies    Cardizem [diltiazem], Sulfa antibiotics, and Sulfonamide derivatives  Review of Systems   Review of Systems  Constitutional:  Positive for appetite change and fever.  HENT:  Negative for sore throat.   Eyes:  Negative for redness.  Respiratory:  Negative for shortness of breath.   Cardiovascular:  Negative for chest pain.  Gastrointestinal:  Positive for abdominal pain and nausea. Negative for vomiting.  Genitourinary:  Negative for dysuria and flank pain.  Musculoskeletal:  Negative for back pain.  Skin:  Negative for rash.  Neurological:  Negative for headaches.  Hematological:  Does not bruise/bleed easily.  Psychiatric/Behavioral:  Negative for confusion.    Physical Exam Updated Vital Signs BP 125/64 (BP Location: Right Arm) Comment: Simultaneous filing. User may not have seen previous data.  Pulse 88 Comment: Simultaneous filing. User may not have seen previous data.  Temp (!) 97.5 F (36.4 C) (Oral)   Resp 20  Comment: Simultaneous filing. User may not have seen previous data.  Ht 1.778 m (5\' 10" )   Wt 77.1 kg   SpO2 98% Comment: Simultaneous filing. User may not have seen previous data.  BMI 24.39 kg/m   Physical Exam Vitals and nursing note reviewed.  Constitutional:      Appearance: Normal appearance. He is well-developed.  HENT:     Head: Atraumatic.     Nose: Nose normal.     Mouth/Throat:     Mouth: Mucous membranes are moist.  Eyes:     General: No scleral icterus.    Conjunctiva/sclera: Conjunctivae normal.  Neck:     Trachea: No tracheal deviation.  Cardiovascular:     Rate and Rhythm: Normal rate and regular rhythm.     Pulses: Normal pulses.     Heart sounds: Normal heart sounds. No murmur heard.   No friction rub. No gallop.  Pulmonary:     Effort: Pulmonary effort is normal. No accessory muscle usage or respiratory distress.     Breath sounds: Normal breath sounds.  Abdominal:     General: Bowel sounds are normal. There is no distension.     Palpations: Abdomen is soft.     Tenderness: There is abdominal tenderness. There is no guarding.     Comments: Right mid to lower abd tenderness. No peritonitis.   Genitourinary:    Comments: No cva tenderness. Musculoskeletal:        General: No swelling.     Cervical back: Normal range of motion and neck supple. No rigidity.  Skin:    General: Skin is warm and dry.     Findings: No rash.  Neurological:     Mental Status: He is alert.     Comments: Alert, speech clear.   Psychiatric:        Mood and Affect: Mood normal.    ED Results / Procedures / Treatments   Labs (all labs ordered are listed, but only abnormal results are displayed) Results for orders placed or performed in visit on 12/28/20  Lipase  Result Value Ref Range   Lipase 8.0 (L) 11.0 - 59.0 U/L  Protime-INR  Result Value Ref Range   INR 4.4 (H) 0.8 - 1.0 ratio   Prothrombin Time 44.3 (H) 9.6 - 13.1 sec  Urinalysis  Result Value Ref Range    Color, Urine YELLOW Yellow;Lt. Yellow;Straw;Dark Yellow;Amber;Green;Red;Brown  APPearance CLEAR Clear;Turbid;Slightly Cloudy;Cloudy   Specific Gravity, Urine 1.010 1.000 - 1.030   pH 6.0 5.0 - 8.0   Total Protein, Urine NEGATIVE Negative   Urine Glucose NEGATIVE Negative   Ketones, ur NEGATIVE Negative   Bilirubin Urine SMALL (A) Negative   Hgb urine dipstick NEGATIVE Negative   Urobilinogen, UA 0.2 0.0 - 1.0   Leukocytes,Ua NEGATIVE Negative   Nitrite NEGATIVE Negative  CBC with Differential/Platelet  Result Value Ref Range   WBC 11.8 (H) 4.0 - 10.5 K/uL   RBC 3.32 (L) 4.22 - 5.81 Mil/uL   Hemoglobin 10.4 (L) 13.0 - 17.0 g/dL   HCT 31.5 (L) 39.0 - 52.0 %   MCV 94.9 78.0 - 100.0 fl   MCHC 33.0 30.0 - 36.0 g/dL   RDW 16.3 (H) 11.5 - 15.5 %   Platelets 425.0 (H) 150.0 - 400.0 K/uL   Neutrophils Relative % 87.5 Repeated and verified X2. (H) 43.0 - 77.0 %   Lymphocytes Relative 4.4 Repeated and verified X2. (L) 12.0 - 46.0 %   Monocytes Relative 6.9 3.0 - 12.0 %   Eosinophils Relative 0.9 0.0 - 5.0 %   Basophils Relative 0.3 0.0 - 3.0 %   Neutro Abs 10.3 (H) 1.4 - 7.7 K/uL   Lymphs Abs 0.5 (L) 0.7 - 4.0 K/uL   Monocytes Absolute 0.8 0.1 - 1.0 K/uL   Eosinophils Absolute 0.1 0.0 - 0.7 K/uL   Basophils Absolute 0.0 0.0 - 0.1 K/uL  Comprehensive metabolic panel  Result Value Ref Range   Sodium 130 (L) 135 - 145 mEq/L   Potassium 3.7 3.5 - 5.1 mEq/L   Chloride 93 (L) 96 - 112 mEq/L   CO2 27 19 - 32 mEq/L   Glucose, Bld 108 (H) 70 - 99 mg/dL   BUN 15 6 - 23 mg/dL   Creatinine, Ser 1.08 0.40 - 1.50 mg/dL   Total Bilirubin 1.2 0.2 - 1.2 mg/dL   Alkaline Phosphatase 106 39 - 117 U/L   AST 42 (H) 0 - 37 U/L   ALT 28 0 - 53 U/L   Total Protein 6.5 6.0 - 8.3 g/dL   Albumin 3.2 (L) 3.5 - 5.2 g/dL   GFR 62.59 >60.00 mL/min   Calcium 8.8 8.4 - 10.5 mg/dL   CT Abdomen Pelvis Wo Contrast  Addendum Date: 12/02/2020   ADDENDUM REPORT: 12/02/2020 16:30 ADDENDUM: The original report was  by Dr. Van Clines. The following addendum is by Dr. Van Clines: These results were called by telephone at the time of interpretation on 12/02/2020 at 4:24 pm to provider Dr. Jeralyn Ruths , who verbally acknowledged these results. Electronically Signed   By: Van Clines M.D.   On: 12/02/2020 16:30   Result Date: 12/02/2020 CLINICAL DATA:  Right lower quadrant abdominal pain. Reduced appetite. Low-grade fever. EXAM: CT ABDOMEN AND PELVIS WITHOUT CONTRAST TECHNIQUE: Multidetector CT imaging of the abdomen and pelvis was performed following the standard protocol without IV contrast. COMPARISON:  09/23/2015 and hepatobiliary ultrasound of 04/26/2020 FINDINGS: Lower chest: Mitral valve prosthesis. Atherosclerotic calcification involving the descending thoracic aorta and right coronary artery. Mild cardiomegaly. Hepatobiliary: Nodular contour the liver compatible with cirrhosis. No noncontrast CT evidence of hepatic mass. Cholecystectomy. No biliary dilatation identified. Pancreas: Unremarkable Spleen: Unremarkable Adrenals/Urinary Tract: Stable scarring of the right kidney upper pole. No hydronephrosis or renal calculi identified. The adrenal glands appear unremarkable. Urinary bladder unremarkable. Stomach/Bowel: Acute appendicitis is present, with an appendicolith along the appendiceal orifice; appendiceal diameter 1.3 cm, and  periappendiceal inflammatory stranding. No abnormal extraluminal gas or abscess. Vascular/Lymphatic: Atherosclerosis is present, including aortoiliac atherosclerotic disease. Reproductive: Unremarkable Other: No supplemental non-categorized findings. Musculoskeletal: Mild lumbar spondylosis. IMPRESSION: 1. Acute appendicitis, appendiceal diameter of up to 1.3 cm; appendicolith the appendiceal orifice; and Peri appendiceal inflammatory stranding. No extraluminal gas or abscess is currently identified. 2. Systemic and aortic Atherosclerosis (ICD10-I70.0). Coronary atherosclerosis.  3. Hepatic cirrhosis. Radiology assistant personnel have been notified to put me in telephone contact with the referring physician or the referring physician's clinical representative in order to discuss these findings. Once this communication is established I will issue an addendum to this report for documentation purposes. Electronically Signed: By: Van Clines M.D. On: 12/02/2020 16:13   CT Abdomen Pelvis W Contrast  Result Date: 12/29/2020 CLINICAL DATA:  History of appendectomy 12/05/2020. Abdominal pain and fever. EXAM: CT ABDOMEN AND PELVIS WITH CONTRAST TECHNIQUE: Multidetector CT imaging of the abdomen and pelvis was performed using the standard protocol following bolus administration of intravenous contrast. CONTRAST:  153mL ISOVUE-300 IOPAMIDOL (ISOVUE-300) INJECTION 61% COMPARISON:  CT scan 12/02/2020 FINDINGS: Lower chest: Borderline cardiac enlargement but no pericardial effusion. Stable aortic calcifications. Prosthetic mitral valve is noted. Small bilateral pleural effusions with overlying atelectasis. Hepatobiliary: Stable advanced cirrhotic changes involving liver but no hepatic lesions are identified. No intrahepatic biliary dilatation. The gallbladder is surgically absent. No common bile duct dilatation. Pancreas: No mass, inflammation or ductal dilatation. Spleen: Normal size.  No focal lesions. Adrenals/Urinary Tract: Adrenal glands and kidneys are unremarkable stable. No worrisome renal lesions or hydronephrosis. The bladder is decompressed. No significant abnormality. Stomach/Bowel: The stomach, duodenum, small bowel and colon are unremarkable. No acute inflammatory process, mass lesions or obstructive findings. Vascular/Lymphatic: Stable atherosclerotic calcifications involving the aorta and iliac arteries but no aneurysm dissection. Branch vessels are patent. Major venous structures are patent. Reproductive: The prostate gland and seminal vesicles are unremarkable. Other: There  is a rim enhancing abscess in the right lower quadrant measuring approximately 7.7 x 6.6 x 6.4 cm. This is just inferior to the cecum and adjacent to the terminal ileum. Some surrounding inflammatory phlegmon. There is also a small amount non rim enhancing fluid in the pelvis but no discrete pelvic abscess. Musculoskeletal: No significant bony findings. Chronic calcific iliopsoas tendinopathy on the right side. IMPRESSION: 1. 7.7 x 6.6 x 6.4 cm rim enhancing abscess in the right lower quadrant. 2. Stable advanced cirrhotic changes involving liver but no hepatic lesions. 3. Small bilateral pleural effusions with overlying atelectasis. 4. Status post cholecystectomy. No biliary dilatation. These results will be called to the ordering clinician or representative by the Radiologist Assistant, and communication documented in the PACS or [employer]. Aortic Atherosclerosis (ICD10-I70.0). Electronically Signed   By: Marijo Sanes M.D.   On: 12/29/2020 09:06     EKG None  Radiology CT Abdomen Pelvis W Contrast  Result Date: 12/29/2020 CLINICAL DATA:  History of appendectomy 12/05/2020. Abdominal pain and fever. EXAM: CT ABDOMEN AND PELVIS WITH CONTRAST TECHNIQUE: Multidetector CT imaging of the abdomen and pelvis was performed using the standard protocol following bolus administration of intravenous contrast. CONTRAST:  130mL ISOVUE-300 IOPAMIDOL (ISOVUE-300) INJECTION 61% COMPARISON:  CT scan 12/02/2020 FINDINGS: Lower chest: Borderline cardiac enlargement but no pericardial effusion. Stable aortic calcifications. Prosthetic mitral valve is noted. Small bilateral pleural effusions with overlying atelectasis. Hepatobiliary: Stable advanced cirrhotic changes involving liver but no hepatic lesions are identified. No intrahepatic biliary dilatation. The gallbladder is surgically absent. No common bile duct dilatation. Pancreas: No mass,  inflammation or ductal dilatation. Spleen: Normal size.  No focal lesions.  Adrenals/Urinary Tract: Adrenal glands and kidneys are unremarkable stable. No worrisome renal lesions or hydronephrosis. The bladder is decompressed. No significant abnormality. Stomach/Bowel: The stomach, duodenum, small bowel and colon are unremarkable. No acute inflammatory process, mass lesions or obstructive findings. Vascular/Lymphatic: Stable atherosclerotic calcifications involving the aorta and iliac arteries but no aneurysm dissection. Branch vessels are patent. Major venous structures are patent. Reproductive: The prostate gland and seminal vesicles are unremarkable. Other: There is a rim enhancing abscess in the right lower quadrant measuring approximately 7.7 x 6.6 x 6.4 cm. This is just inferior to the cecum and adjacent to the terminal ileum. Some surrounding inflammatory phlegmon. There is also a small amount non rim enhancing fluid in the pelvis but no discrete pelvic abscess. Musculoskeletal: No significant bony findings. Chronic calcific iliopsoas tendinopathy on the right side. IMPRESSION: 1. 7.7 x 6.6 x 6.4 cm rim enhancing abscess in the right lower quadrant. 2. Stable advanced cirrhotic changes involving liver but no hepatic lesions. 3. Small bilateral pleural effusions with overlying atelectasis. 4. Status post cholecystectomy. No biliary dilatation. These results will be called to the ordering clinician or representative by the Radiologist Assistant, and communication documented in the PACS or [employer]. Aortic Atherosclerosis (ICD10-I70.0). Electronically Signed   By: Marijo Sanes M.D.   On: 12/29/2020 09:06    Procedures Procedures   Medications Ordered in ED Medications  sodium chloride 0.9 % bolus 1,000 mL (has no administration in time range)    ED Course  I have reviewed the triage vital signs and the nursing notes.  Pertinent labs & imaging results that were available during my care of the patient were reviewed by me and considered in my medical decision making  (see chart for details).    MDM Rules/Calculators/A&P                          Iv ns bolus. Labs sent.   Reviewed nursing notes and prior charts for additional history.  Reviewed/interpreted recent cbc - increased wbc from prior.   CT reviewed/interpreted by me - RLQ abscess.   General surgery consulted.   Will plan to admit to gen surgery, likely they will consult IR for drain.   Pt currently declines pain/nausea meds. Zosyn iv.   Dr Marlou Starks called - he will see  in consult - requests that given pts many other medical issues, antidoag therapy, etc, that medicine admit.   Signed out to Dr Tyrone Nine to f/u on pending labs, admit to medicine/hospitalists.        Final Clinical Impression(s) / ED Diagnoses Final diagnoses:  None    Rx / DC Orders ED Discharge Orders     None        Lajean Saver, MD 12/29/20 1501    Lajean Saver, MD 12/29/20 1513

## 2020-12-29 NOTE — Telephone Encounter (Signed)
MD wanted to call [facility] ER to let them know that pt is coming by EMS. He has an abdomina abscess that is septic. Called the charge nurse Thurmond Butts, RN) gave him MD response.Marland KitchenJohny Gould

## 2020-12-29 NOTE — ED Triage Notes (Addendum)
Pt arrives via EMS status post appendectomy. Went for a follow up today and was told he has an abscess and needed to be seen in the hospital to have a drain placed.

## 2020-12-29 NOTE — Progress Notes (Addendum)
ANTICOAGULATION CONSULT NOTE - Initial Consult  Pharmacy Consult for Heparin Indication:  Bridging Warfarin While Being Held For Procedure  Allergies  Allergen Reactions   Diltiazem Other (See Comments)    "Bradycardia," per pt; tolerates Amlodipine   Sulfa Antibiotics Rash and Other (See Comments)    Reaction not fully recalled    Sulfonamide Derivatives Rash and Other (See Comments)    Reaction not fully recalled     Patient Measurements: Height: 5\' 10"  (177.8 cm) Weight: 77.1 kg (170 lb) IBW/kg (Calculated) : 73  Vital Signs: Temp: 97.8 F (36.6 C) (10/06 1842) Temp Source: Oral (10/06 1842) BP: 125/67 (10/06 1840) Pulse Rate: 84 (10/06 1840)  Labs: Recent Labs    12/28/20 0840 12/29/20 1520  HGB 10.4* 9.5*  HCT 31.5* 28.4*  PLT 425.0* 359  LABPROT 44.3* 40.9*  INR 4.4* 4.3*  CREATININE 1.08 1.14    Estimated Creatinine Clearance: 48.9 mL/min (by C-G formula based on SCr of 1.14 mg/dL).   Medical History: 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   NSVT (nonsustained ventricular tachycardia)    Peripheral edema    venous insufficiency   Permanent atrial fibrillation (HCC)    Pulmonary hypertension (HCC)    Syncope    TIA (transient ischemic attack)    while on coumadin   Type II or unspecified type diabetes mellitus without mention of complication, not stated as uncontrolled    lifestyle mangement   Ventricular ectopy    symptomatic    Medications:  (Not in a hospital admission)  Scheduled:   [START ON 12/30/2020] aspirin  81 mg Oral Daily   [START ON 12/30/2020] feeding supplement  237 mL Oral BID BM   latanoprost  1 drop Both Eyes QHS   magnesium oxide  400 mg Oral BID   [START ON 12/30/2020] pantoprazole  40 mg Oral QAC breakfast    phytonadione  2.5 mg Oral Once   sodium chloride flush  3 mL Intravenous Q12H   Infusions:   piperacillin-tazobactam (ZOSYN)  IV 3.375 g (12/29/20 1841)   PRN: acetaminophen **OR** acetaminophen, albuterol, ondansetron **OR** ondansetron (ZOFRAN) IV  Assessment: 30 yom with a history of atrial fibrillation on chronic anticoagulation, mitral valve replacement 1986, hypertension, hyperlipidemia, diabetes mellitus type 2, CKD. Patient is presenting with abdominal pain. Heparin per pharmacy consult placed for  Bridging Warfarin While Being Held For Procedure .  Patient with recent hospitalization 9/9-9/25 for acute appendicitis. Pt is s/p appendectomy (8/65) complicated by bleeding requiring PRBCs.  CT scan of the abdomen and pelvis with abscess in the right lower quadrant and small bilateral pleural effusions with overlying atelectasis  Patient is on warfarin prior to arrival. Last dose 10/5. Home Regimen per last anticoag note: 2.5 mg (5 mg x 0.5) every Mon; 5 mg (5 mg x 1) all other days. INR goal per last anti-coag note 3-3.5  PT/INR 40.9/4.3 Hgb 9.5; plt 359  Goal of Therapy:  Heparin level 0.3-0.7 units/ml Heparin level 66-102 units/ml Monitor platelets by anticoagulation protocol: Yes   Plan:  Hold heparin - start when INR < 3 Continue to monitor via aPTT until levels are correlated Continue to monitor H&H and platelets  Lorelei Pont, PharmD, BCPS 12/29/2020 7:41 PM ED Clinical  Pharmacist -  [phone]

## 2020-12-29 NOTE — Progress Notes (Addendum)
Progress Note     Subjective: This patient is known to our service as he was admitted on 9/10 for acute appendicitis and is on warfarin secondary to a mechanical mitral valve and required a couple of days for his INR to get near 1.5.  he underwent a lap appy on 12/05/20 where the appendix was  avulsed during surgery due to its friability.  A stapler was not able to be used to close this but was closed with a pursestring 2-0 vicryl enodstitch.  A JP drain was placed as well.  Post operatively he had some issues with downtrending hgbs and he was felt to have had some post operative bleeding.  His heparin gtt was held and as things stabilized out, this was able to be restarted.  He was finally stable for DC home on POD 13 and his drain remained serosanguineous and was able to be removed.    Since being at home he has had some mild persistent RLQ abdominal soreness, not pain he states.  2-3 days of low grade fevers that never got above 100.  He has not been eating well and has been very weak and tired.  He denies any nausea, just anorexia.  He moved his bowels well today after drinking the contrast for his CT scan.  He denies any blood in his stool.  His most recent INR was 4.4 which was down from 8.3 6 days ago.  Labs are pending today for INR and CMET, but his hgb is relatively stable from time of discharge.  He went to his PCP's office today given he still just wasn't feeling well.  He was sent for a CT scan which revealed a fluid collection in his RLQ.  He was referred to the hospital for further evaluation and management.  ROS: Please see HPI, otherwise all other systems are currently negative.  Objective: Vital signs in last 24 hours: Temp:  [97.5 F (36.4 C)] 97.5 F (36.4 C) (10/06 1449) Pulse Rate:  [88] 88 (10/06 1449) Resp:  [20] 20 (10/06 1449) BP: (125)/(64) 125/64 (10/06 1449) SpO2:  [98 %] 98 % (10/06 1449) Weight:  [77.1 kg] 77.1 kg (10/06 1448)    Intake/Output from previous  day: No intake/output data recorded. Intake/Output this shift: No intake/output data recorded.  PE: General: pleasant, WD, male who is laying in bed in NAD HEENT: head is normocephalic, atraumatic.  Sclera are noninjected.  PERRL.  Ears and nose without any masses or lesions.  Mouth is pink and moist Heart: irregular.  Click audible from mechanical valve.  Normal s1,s2. No obvious murmurs, gallops, or rubs noted.  Palpable radial and pedal pulses bilaterally Lungs: CTAB, no wheezes, rhonchi, or rales noted.  Respiratory effort nonlabored Abd: soft, NT, ND, +BS, no masses, hernias, or organomegaly. All incisions are well healed.  Fading ecchymosis around periumbilical incision. MS: all 4 extremities are symmetrical with no cyanosis, clubbing. +2 pitting edema in BLE Skin: warm and dry with no masses, lesions, or rashes Neuro: Cranial nerves 2-12 grossly intact, sensation is normal throughout Psych: A&Ox3 with an appropriate affect.    Lab Results:  Recent Labs    12/28/20 0840  WBC 11.8*  HGB 10.4*  HCT 31.5*  PLT 425.0*   BMET Recent Labs    12/28/20 0840  NA 130*  K 3.7  CL 93*  CO2 27  GLUCOSE 108*  BUN 15  CREATININE 1.08  CALCIUM 8.8   PT/INR Recent Labs    12/28/20  0840  LABPROT 44.3*  INR 4.4*   CMP     Component Value Date/Time   NA 130 (L) 12/28/2020 0840   K 3.7 12/28/2020 0840   CL 93 (L) 12/28/2020 0840   CO2 27 12/28/2020 0840   GLUCOSE 108 (H) 12/28/2020 0840   BUN 15 12/28/2020 0840   CREATININE 1.08 12/28/2020 0840   CREATININE 1.40 (H) 04/01/2015 1540   CALCIUM 8.8 12/28/2020 0840   PROT 6.5 12/28/2020 0840   ALBUMIN 3.2 (L) 12/28/2020 0840   AST 42 (H) 12/28/2020 0840   ALT 28 12/28/2020 0840   ALKPHOS 106 12/28/2020 0840   BILITOT 1.2 12/28/2020 0840   GFRNONAA >60 12/18/2020 0245   GFRAA 107 11/17/2007 1231   Lipase     Component Value Date/Time   LIPASE 8.0 (L) 12/28/2020 0840       Studies/Results: CT Abdomen Pelvis W  Contrast  Result Date: 12/29/2020 CLINICAL DATA:  History of appendectomy 12/05/2020. Abdominal pain and fever. EXAM: CT ABDOMEN AND PELVIS WITH CONTRAST TECHNIQUE: Multidetector CT imaging of the abdomen and pelvis was performed using the standard protocol following bolus administration of intravenous contrast. CONTRAST:  151mL ISOVUE-300 IOPAMIDOL (ISOVUE-300) INJECTION 61% COMPARISON:  CT scan 12/02/2020 FINDINGS: Lower chest: Borderline cardiac enlargement but no pericardial effusion. Stable aortic calcifications. Prosthetic mitral valve is noted. Small bilateral pleural effusions with overlying atelectasis. Hepatobiliary: Stable advanced cirrhotic changes involving liver but no hepatic lesions are identified. No intrahepatic biliary dilatation. The gallbladder is surgically absent. No common bile duct dilatation. Pancreas: No mass, inflammation or ductal dilatation. Spleen: Normal size.  No focal lesions. Adrenals/Urinary Tract: Adrenal glands and kidneys are unremarkable stable. No worrisome renal lesions or hydronephrosis. The bladder is decompressed. No significant abnormality. Stomach/Bowel: The stomach, duodenum, small bowel and colon are unremarkable. No acute inflammatory process, mass lesions or obstructive findings. Vascular/Lymphatic: Stable atherosclerotic calcifications involving the aorta and iliac arteries but no aneurysm dissection. Branch vessels are patent. Major venous structures are patent. Reproductive: The prostate gland and seminal vesicles are unremarkable. Other: There is a rim enhancing abscess in the right lower quadrant measuring approximately 7.7 x 6.6 x 6.4 cm. This is just inferior to the cecum and adjacent to the terminal ileum. Some surrounding inflammatory phlegmon. There is also a small amount non rim enhancing fluid in the pelvis but no discrete pelvic abscess. Musculoskeletal: No significant bony findings. Chronic calcific iliopsoas tendinopathy on the right side.  IMPRESSION: 1. 7.7 x 6.6 x 6.4 cm rim enhancing abscess in the right lower quadrant. 2. Stable advanced cirrhotic changes involving liver but no hepatic lesions. 3. Small bilateral pleural effusions with overlying atelectasis. 4. Status post cholecystectomy. No biliary dilatation. These results will be called to the ordering clinician or representative by the Radiologist Assistant, and communication documented in the PACS or [employer]. Aortic Atherosclerosis (ICD10-I70.0). Electronically Signed   By: Marijo Sanes M.D.   On: 12/29/2020 09:06    Anti-infectives: Anti-infectives (From admission, onward)    Start     Dose/Rate Route Frequency Ordered Stop   12/29/20 1530  piperacillin-tazobactam (ZOSYN) IVPB 3.375 g        3.375 g 12.5 mL/hr over 240 Minutes Intravenous Every 8 hours 12/29/20 1519     12/29/20 1515  piperacillin-tazobactam (ZOSYN) IVPB 3.375 g  Status:  Discontinued        3.375 g 100 mL/hr over 30 Minutes Intravenous  Once 12/29/20 1511 12/29/20 1519        Assessment/Plan POD#24 s/p  Laparoscopic appendectomy for Acute appendicitis 9/12 Dr. Barry Dienes with RLQ fluid collection The patient appears to have a RLQ fluid collection.  Given his post-op bleeding issues and a recent INR of 8.3 and still around 4.5, this is likely a hematoma that could have become infected.  His WBC is only 11 and he really hasn't had much in the way of fevers at home.  However, it is reasonable to ask IR to evaluate this collection to consider percutaneous drainage.  He has been started on zosyn currently as well.  He will need his INR down to at least 1.5-1.7 for IR to proceed with drain placement.  Will defer to medicine/cardiology on how they would like to do this, "reverse vs trend down" given he has a mechanical valve.  He is ok from our standpoint for a heparin gtt when INR below 2 or whatever is appropriate for a valve.  He may eat from our standpoint.  I will add Ensure as well for additional  supplementation since he has not been eating well.   ID - zosyn 10/6>> FEN - heart healthy diet, Ensure VTE - HOLD Coumadin, would need to transition to heparin gtt once INR below 2, per medicine   Mechanical mitral valve replacement on coumadin at home - PT INR pending  Permanent A Fib HTN CAD CKD IIIa  CHF Cirrhosis   LOS: 0 days   Henreitta Cea, [facility] 12/29/2020, 3:20 PM Please see Amion for pager number during day hours 7:00am-4:30pm

## 2020-12-30 DIAGNOSIS — E871 Hypo-osmolality and hyponatremia: Secondary | ICD-10-CM | POA: Diagnosis present

## 2020-12-30 DIAGNOSIS — D509 Iron deficiency anemia, unspecified: Secondary | ICD-10-CM | POA: Diagnosis present

## 2020-12-30 DIAGNOSIS — E876 Hypokalemia: Secondary | ICD-10-CM | POA: Diagnosis present

## 2020-12-30 LAB — CBC
HCT: 26.5 % — ABNORMAL LOW (ref 39.0–52.0)
Hemoglobin: 9 g/dL — ABNORMAL LOW (ref 13.0–17.0)
MCH: 31.7 pg (ref 26.0–34.0)
MCHC: 34 g/dL (ref 30.0–36.0)
MCV: 93.3 fL (ref 80.0–100.0)
Platelets: 322 10*3/uL (ref 150–400)
RBC: 2.84 MIL/uL — ABNORMAL LOW (ref 4.22–5.81)
RDW: 14.9 % (ref 11.5–15.5)
WBC: 4.9 10*3/uL (ref 4.0–10.5)
nRBC: 0 % (ref 0.0–0.2)

## 2020-12-30 LAB — PROTIME-INR
INR: 4.1 (ref 0.8–1.2)
INR: 1.6 — ABNORMAL HIGH (ref 0.8–1.2)
Prothrombin Time: 39.5 seconds — ABNORMAL HIGH (ref 11.4–15.2)
Prothrombin Time: 19.1 seconds — ABNORMAL HIGH (ref 11.4–15.2)

## 2020-12-30 LAB — BASIC METABOLIC PANEL
Anion gap: 7 (ref 5–15)
BUN: 11 mg/dL (ref 8–23)
CO2: 25 mmol/L (ref 22–32)
Calcium: 8.2 mg/dL — ABNORMAL LOW (ref 8.9–10.3)
Chloride: 98 mmol/L (ref 98–111)
Creatinine, Ser: 1.04 mg/dL (ref 0.61–1.24)
GFR, Estimated: >60 mL/min (ref >60)
Glucose, Bld: 97 mg/dL (ref 70–99)
Potassium: 3.3 mmol/L — ABNORMAL LOW (ref 3.5–5.1)
Sodium: 130 mmol/L — ABNORMAL LOW (ref 135–145)

## 2020-12-30 LAB — TYPE AND SCREEN
ABO/RH(D): A POS
Antibody Screen: NEGATIVE

## 2020-12-30 MED ORDER — SODIUM CHLORIDE 0.9% IV SOLUTION: Freq: Once | INTRAVENOUS | Status: AC

## 2020-12-30 MED ORDER — POTASSIUM CHLORIDE CRYS ER 20 MEQ PO TBCR
40.0000 meq | EXTENDED_RELEASE_TABLET | Freq: Once | ORAL | Status: AC
  Administered 2020-12-30: 40 meq via ORAL
  Filled 2020-12-30: qty 2

## 2020-12-30 MED ORDER — HEPARIN (PORCINE) 25000 UT/250ML-% IV SOLN
1400.0000 [IU]/h | INTRAVENOUS | Status: DC
Start: 2020-12-30 — End: 2021-01-02
  Administered 2020-12-30: 1050 [IU]/h via INTRAVENOUS
  Administered 2021-01-01 (×2): 1400 [IU]/h via INTRAVENOUS
  Filled 2020-12-30 (×4): qty 250

## 2020-12-30 NOTE — Telephone Encounter (Signed)
Addressed.  Thanks.

## 2020-12-30 NOTE — Progress Notes (Signed)
ANTICOAGULATION CONSULT NOTE - Follow Up Consult  Pharmacy Consult for Heparin Indication:  MVR  Allergies  Allergen Reactions   Diltiazem Other (See Comments)    "Bradycardia," per pt; tolerates Amlodipine   Sulfa Antibiotics Rash and Other (See Comments)    Reaction not fully recalled    Sulfonamide Derivatives Rash and Other (See Comments)    Reaction not fully recalled     Patient Measurements: Height: 5\' 10"  (177.8 cm) Weight: 76.5 kg (168 lb 10.4 oz) IBW/kg (Calculated) : 73 Heparin Dosing Weight:  76.5 kg  Vital Signs: Temp: 98.1 F (36.7 C) (10/07 0747) Temp Source: Oral (10/07 0747) BP: 111/49 (10/07 0747) Pulse Rate: 72 (10/07 0747)  Labs: Recent Labs    12/28/20 0840 12/29/20 1520 12/29/20 2225 12/30/20 0506  HGB 10.4* 9.5* 8.7* 9.0*  HCT 31.5* 28.4* 27.0* 26.5*  PLT 425.0* 359  --  322  LABPROT 44.3* 40.9*  --  39.5*  INR 4.4* 4.3*  --  4.1*  CREATININE 1.08 1.14  --  1.04    Estimated Creatinine Clearance: 53.6 mL/min (by C-G formula based on SCr of 1.04 mg/dL).   Assessment: CC/HPI: RLQ abdominal soreness.  CT scan of the abdomen and pelvis revealed a 7.7 x 6.6 x 6.4 rim-enhancing abscess in the right lower quadrant and small bilateral pleural effusions with overlying atelectasis   - He had just recently been hospitalized from 9/9-9/25 after presenting complaints of abdominal soreness found to have acute appendicitis. Appendectomy 2/08 complicated by post-op bleeding.  PMH: atrial fibrillation on chronic anticoagulation, mitral valve replacement 1986, hypertension, hyperlipidemia, diabetes mellitus type 2, CKD3, peripheral edema, pulmonary HTN, syncope, TIA, HF, gout, gERD   Anticoag: Warf PTA for afib/TIA. Last dose 10/5. INR today 4.1. Hgb 9. Plts WNL - 10/6: Vit K po 2.5mg   - PTA Coumadin 2.5 mg (5 mg x 0.5) every Mon; 5 mg (5 mg x 1) all other days.. INR goal per last anti-coag note 3-3.5  Goal of Therapy:  Heparin level 0.3-0.7  units/ml Monitor platelets by anticoagulation protocol: Yes   Plan:  Hold heparin - start when INR < 3 10/7: FFP ordered x 3 Will order INR post-FFP   Enma Maeda S. Alford Highland, PharmD, BCPS Clinical Staff Pharmacist [email or web address]  Alford Highland, [place] 12/30/2020,9:07 AM

## 2020-12-30 NOTE — Consult Note (Signed)
Chief Complaint: Patient was seen in consultation today for  Chief Complaint  Patient presents with   Abscess    Referring Physician(s): Barkley Boards, [name]  Supervising Physician: Mir, Sharen Heck  Patient Status: [facility] - Out-[facility]  History of Present Illness: Anthony Gould is an 87 y.o. male with a medical history significant for atrial fibrillation on coumadin, mitral valve replacement, CKD Stage III, DM2 and a recent bout of acute appendicitis s/p appendectomy 12/05/20. Post-procedure he required two units of PRBCs for prolonged bleeding. He was discharged home 12/18/20 and he presented to his PCP 12/28/20 with complaints of abdominal soreness, nausea, poor appetite and generalized weakness. A CT scan was ordered which revealed a RLQ abscess and he was sent to the ED for further evaluation.   CT abdomen/pelvis 12/29/20 Other: There is a rim enhancing abscess in the right lower quadrant measuring approximately 7.7 x 6.6 x 6.4 cm. This is just inferior to the cecum and adjacent to the terminal ileum. Some surrounding inflammatory phlegmon. There is also a small amount non rim enhancing fluid in the pelvis but no discrete pelvic abscess. IMPRESSION: 1. 7.7 x 6.6 x 6.4 cm rim enhancing abscess in the right lower quadrant. 2. Stable advanced cirrhotic changes involving liver but no hepatic lesions. 3. Small bilateral pleural effusions with overlying atelectasis. 4. Status post cholecystectomy. No biliary dilatation.  The patient was evaluated by the Surgical team and given his post-op bleeding issues, elevated INR and no leukocytosis or fevers they suspect this fluid collection is a hematoma.  Interventional Radiology has been asked to evaluate this patient for an image-guided right lower quadrant fluid collection aspiration with drain placement. The case was reviewed and procedure approved by Dr. Dwaine Gale.    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   NSVT (nonsustained ventricular tachycardia)    Peripheral edema    venous insufficiency   Permanent atrial fibrillation (HCC)    Pulmonary hypertension (HCC)    Syncope    TIA (transient ischemic attack)    while on coumadin   Type II or unspecified type diabetes mellitus without mention of complication, not stated as uncontrolled    lifestyle mangement   Ventricular ectopy    symptomatic    Past Surgical History:  Procedure Laterality Date   CHOLECYSTECTOMY, LAPAROSCOPIC  2003   LAPAROSCOPIC APPENDECTOMY N/A 12/05/2020   Procedure: APPENDECTOMY LAPAROSCOPIC;  Surgeon: Stark Klein, MD;  Location: [facility] OR;  Service: General;  Laterality: N/A;   MITRAL VALVE REPLACEMENT     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    Allergies: Diltiazem, Sulfa antibiotics, and Sulfonamide derivatives  Medications: Prior to Admission medications   Medication Sig Start Date End Date Taking? Authorizing Provider  acetaminophen (TYLENOL) 500 MG tablet Take 500 mg by mouth every 6 (six) hours as needed for mild pain or headache.   Yes [provider]  allopurinol (ZYLOPRIM) 300 MG tablet TAKE 1 TABLET BY MOUTH DAILY AS NEEDED. GENERIC EQUIVALENT FOR ZYLOPRIM Patient taking differently: Take 300 mg by mouth daily. 04/14/20  Yes Plotnikov, Evie Lacks, MD  amLODipine (NORVASC) 5 MG tablet TAKE 1/2 TABLET BY MOUTH EVERY DAY FOR 3 DAYS,IF BLOOD PRESSURE IS STILL HIGHER THAN 130 THEN INCREASE TO  1 TABLET BY MOUTH DAILY Patient taking differently: Take 5 mg by mouth in the morning. 03/28/20  Yes Dunn, Nedra Hai, [name]  aspirin 81 MG EC tablet Take 81 mg by mouth daily.   Yes [provider]  atorvastatin (LIPITOR) 20 MG tablet TAKE 1 TABLET BY MOUTH TWO TIMES A WEEK Patient taking differently: Take 20 mg by  mouth See admin instructions. Take 20 mg by mouth at bedtime on Mondays and Fridays 04/14/20  Yes Plotnikov, Evie Lacks, MD  bisoprolol (ZEBETA) 5 MG tablet Take 0.5 tablets (2.5 mg total) by mouth daily. Patient taking differently: Take 2.5 mg by mouth at bedtime. 12/08/19  Yes Dunn, Nedra Hai, [name]  Cholecalciferol (VITAMIN D3) 50 MCG (2000 UT) capsule Take 1 capsule (2,000 Units total) by mouth daily. 10/01/19  Yes Plotnikov, Evie Lacks, MD  dipyridamole (PERSANTINE) 50 MG tablet Take 1 tablet (50 mg total) by mouth 3 (three) times daily. 08/08/20  Yes Burnell Blanks, MD  enalapril (VASOTEC) 20 MG tablet TAKE 1 TABLET BY MOUTH DAILY. Patient taking differently: Take 20 mg by mouth in the morning. 08/07/20  Yes Plotnikov, Evie Lacks, MD  furosemide (LASIX) 40 MG tablet TAKE 1 TABLET BY MOUTH DAILY GENERIC EQUIVALENT FOR LASIX Patient taking differently: Take 40 mg by mouth in the morning. 07/04/20  Yes Plotnikov, Evie Lacks, MD  latanoprost (XALATAN) 0.005 % ophthalmic solution Place 1 drop into both eyes at bedtime.   Yes [provider]  magnesium oxide (MAG-OX) 400 MG tablet TAKE 1 TABLET(400 MG) BY MOUTH TWICE DAILY Patient taking differently: Take 400 mg by mouth 2 (two) times daily. 04/14/20  Yes Dunn, [name] N, [name]  Milk Thistle 1000 MG CAPS Take 1,000 mg by mouth daily.   Yes [provider]  pantoprazole (PROTONIX) 40 MG tablet TAKE 1 TABLET BY MOUTH DAILY Patient taking differently: Take 40 mg by mouth daily before breakfast. 08/07/20  Yes Plotnikov, Evie Lacks, MD  docusate sodium (COLACE) 100 MG capsule Take 1 capsule (100 mg total) by mouth 2 (two) times daily. Patient not taking: Reported on 12/29/2020 12/18/20   Lorella Nimrod, MD  ferrous [id number] C-folic acid (TRINSICON / FOLTRIN) capsule Take 1 capsule by mouth 2 (two) times daily after a meal. Patient not taking: Reported on 12/29/2020 12/18/20   Lorella Nimrod, MD  warfarin (COUMADIN) 5 MG tablet TAKE 1  TABLET BY MOUTH AS DIRECTED BY THE COUMADIN CLINIC Patient not taking: No sig reported 06/27/20   Burnell Blanks, MD     Family History  Problem Relation Age of Onset   Coronary artery disease Father    Diabetes Other    Coronary artery disease Other    Prostate cancer Neg Hx    Colon cancer Neg Hx     Social History   Socioeconomic History   Marital status: Married    Spouse name: [name]   Number of children: 2   Years of education: Not on file   Highest education level: Not on file  Occupational History   Occupation: Programme researcher, 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  Tobacco Use   Smoking status: Never   Smokeless tobacco: Never  Substance and Sexual Activity   Alcohol use: No    Alcohol/week: 0.0 standard drinks   Drug use: No   Sexual activity: Not on file  Other Topics Concern   Not on file  Social History Narrative   HSG, many management courses Married '58, 2 sons - '61, '62  Social Determinants of Health   Financial Resource Strain: Not on file  Food Insecurity: Not on file  Transportation Needs: Not on file  Physical Activity: Not on file  Stress: Not on file  Social Connections: Not on file    Review of Systems: A 12 point ROS discussed and pertinent positives are indicated in the HPI above.  All other systems are negative.  Review of Systems  Constitutional:  Positive for appetite change and fatigue.  Respiratory:  Negative for cough and shortness of breath.   Cardiovascular:  Negative for chest pain and leg swelling.  Gastrointestinal:  Positive for abdominal pain. Negative for diarrhea, nausea and vomiting.  Neurological:  Negative for headaches.   Vital Signs: BP 123/68   Pulse 70   Temp 98 F (36.7 C) (Oral)   Resp 17   Ht 5\' 10"  (1.778 m)   Wt 168 lb 10.4 oz (76.5 kg)   SpO2 (P) 98%   BMI 24.20 kg/m   Physical Exam Constitutional:      General: He is not in acute distress.    Appearance: He is not ill-appearing.  HENT:      Ears:     Comments: Hard of hearing    Mouth/Throat:     Mouth: Mucous membranes are moist.     Pharynx: Oropharynx is clear.  Cardiovascular:     Rate and Rhythm: Normal rate and regular rhythm.     Comments: Mechanical valve click Pulmonary:     Effort: Pulmonary effort is normal.     Breath sounds: Normal breath sounds.  Abdominal:     Palpations: Abdomen is soft.     Tenderness: There is abdominal tenderness.     Comments: Periumbilical ecchymosis.   Skin:    General: Skin is warm and dry.  Neurological:     Mental Status: He is alert and oriented to person, place, and time.    Imaging: CT Abdomen Pelvis Wo Contrast  Addendum Date: 12/02/2020   ADDENDUM REPORT: 12/02/2020 16:30 ADDENDUM: The original report was by Dr. Van Clines. The following addendum is by Dr. Van Clines: These results were called by telephone at the time of interpretation on 12/02/2020 at 4:24 pm to provider Dr. Jeralyn Ruths , who verbally acknowledged these results. Electronically Signed   By: Van Clines M.D.   On: 12/02/2020 16:30   Result Date: 12/02/2020 CLINICAL DATA:  Right lower quadrant abdominal pain. Reduced appetite. Low-grade fever. EXAM: CT ABDOMEN AND PELVIS WITHOUT CONTRAST TECHNIQUE: Multidetector CT imaging of the abdomen and pelvis was performed following the standard protocol without IV contrast. COMPARISON:  09/23/2015 and hepatobiliary ultrasound of 04/26/2020 FINDINGS: Lower chest: Mitral valve prosthesis. Atherosclerotic calcification involving the descending thoracic aorta and right coronary artery. Mild cardiomegaly. Hepatobiliary: Nodular contour the liver compatible with cirrhosis. No noncontrast CT evidence of hepatic mass. Cholecystectomy. No biliary dilatation identified. Pancreas: Unremarkable Spleen: Unremarkable Adrenals/Urinary Tract: Stable scarring of the right kidney upper pole. No hydronephrosis or renal calculi identified. The adrenal glands appear unremarkable.  Urinary bladder unremarkable. Stomach/Bowel: Acute appendicitis is present, with an appendicolith along the appendiceal orifice; appendiceal diameter 1.3 cm, and periappendiceal inflammatory stranding. No abnormal extraluminal gas or abscess. Vascular/Lymphatic: Atherosclerosis is present, including aortoiliac atherosclerotic disease. Reproductive: Unremarkable Other: No supplemental non-categorized findings. Musculoskeletal: Mild lumbar spondylosis. IMPRESSION: 1. Acute appendicitis, appendiceal diameter of up to 1.3 cm; appendicolith the appendiceal orifice; and Peri appendiceal inflammatory stranding. No extraluminal gas or abscess is currently identified. 2. Systemic and aortic Atherosclerosis (  ICD10-I70.0). Coronary atherosclerosis. 3. Hepatic cirrhosis. Radiology assistant personnel have been notified to put me in telephone contact with the referring physician or the referring physician's clinical representative in order to discuss these findings. Once this communication is established I will issue an addendum to this report for documentation purposes. Electronically Signed: By: Van Clines M.D. On: 12/02/2020 16:13   CT Abdomen Pelvis W Contrast  Result Date: 12/29/2020 CLINICAL DATA:  History of appendectomy 12/05/2020. Abdominal pain and fever. EXAM: CT ABDOMEN AND PELVIS WITH CONTRAST TECHNIQUE: Multidetector CT imaging of the abdomen and pelvis was performed using the standard protocol following bolus administration of intravenous contrast. CONTRAST:  163mL ISOVUE-300 IOPAMIDOL (ISOVUE-300) INJECTION 61% COMPARISON:  CT scan 12/02/2020 FINDINGS: Lower chest: Borderline cardiac enlargement but no pericardial effusion. Stable aortic calcifications. Prosthetic mitral valve is noted. Small bilateral pleural effusions with overlying atelectasis. Hepatobiliary: Stable advanced cirrhotic changes involving liver but no hepatic lesions are identified. No intrahepatic biliary dilatation. The gallbladder  is surgically absent. No common bile duct dilatation. Pancreas: No mass, inflammation or ductal dilatation. Spleen: Normal size.  No focal lesions. Adrenals/Urinary Tract: Adrenal glands and kidneys are unremarkable stable. No worrisome renal lesions or hydronephrosis. The bladder is decompressed. No significant abnormality. Stomach/Bowel: The stomach, duodenum, small bowel and colon are unremarkable. No acute inflammatory process, mass lesions or obstructive findings. Vascular/Lymphatic: Stable atherosclerotic calcifications involving the aorta and iliac arteries but no aneurysm dissection. Branch vessels are patent. Major venous structures are patent. Reproductive: The prostate gland and seminal vesicles are unremarkable. Other: There is a rim enhancing abscess in the right lower quadrant measuring approximately 7.7 x 6.6 x 6.4 cm. This is just inferior to the cecum and adjacent to the terminal ileum. Some surrounding inflammatory phlegmon. There is also a small amount non rim enhancing fluid in the pelvis but no discrete pelvic abscess. Musculoskeletal: No significant bony findings. Chronic calcific iliopsoas tendinopathy on the right side. IMPRESSION: 1. 7.7 x 6.6 x 6.4 cm rim enhancing abscess in the right lower quadrant. 2. Stable advanced cirrhotic changes involving liver but no hepatic lesions. 3. Small bilateral pleural effusions with overlying atelectasis. 4. Status post cholecystectomy. No biliary dilatation. These results will be called to the ordering clinician or representative by the Radiologist Assistant, and communication documented in the PACS or [employer]. Aortic Atherosclerosis (ICD10-I70.0). Electronically Signed   By: Marijo Sanes M.D.   On: 12/29/2020 09:06   DG CHEST PORT 1 VIEW  Result Date: 12/29/2020 CLINICAL DATA:  History of recent appendectomy with known abscess EXAM: PORTABLE CHEST 1 VIEW COMPARISON:  11/14/2015 FINDINGS: Cardiac shadow is enlarged. Postsurgical changes are  noted with valve replacement. The lungs are clear bilaterally. No bony abnormality is noted. IMPRESSION: No active disease. Electronically Signed   By: Inez Catalina M.D.   On: 12/29/2020 22:49    Labs:  CBC: Recent Labs    12/18/20 0245 12/28/20 0840 12/29/20 1520 12/29/20 2225 12/30/20 0506  WBC 5.7 11.8* 7.8  --  4.9  HGB 9.2* 10.4* 9.5* 8.7* 9.0*  HCT 27.3* 31.5* 28.4* 27.0* 26.5*  PLT 226 425.0* 359  --  322    COAGS: Recent Labs    12/04/20 1652 12/05/20 0857 12/26/20 1419 12/28/20 0840 12/29/20 1520 12/30/20 0506  INR  --    < > 4.9* 4.4* 4.3* 4.1*  APTT 55*  --   --   --   --   --    < > = values in this interval not displayed.  BMP: Recent Labs    12/17/20 0351 12/18/20 0245 12/28/20 0840 12/29/20 1520 12/30/20 0506  NA 132* 133* 130* 125* 130*  K 3.7 3.7 3.7 3.5 3.3*  CL 100 100 93* 92* 98  CO2 25 25 27 23 25   GLUCOSE 93 104* 108* 144* 97  BUN 7* 8 15 16 11   CALCIUM 8.1* 8.2* 8.8 8.2* 8.2*  CREATININE 0.96 0.93 1.08 1.14 1.04  GFRNONAA >60 >60  --  >60 >60    LIVER FUNCTION TESTS: Recent Labs    12/03/20 0147 12/07/20 1001 12/28/20 0840 12/29/20 1520  BILITOT 1.9* 1.7* 1.2 1.0  AST 37 31 42* 50*  ALT 27 21 28 31   ALKPHOS 78 61 106 107  PROT 6.6 5.6* 6.5 6.0*  ALBUMIN 3.6 2.9* 3.2* 2.7*    TUMOR MARKERS: No results for input(s): AFPTM, CEA, CA199, CHROMGRNA in the last 8760 hours.  Assessment and Plan:  Recent appendectomy with post-op RLQ fluid collection: Anthony Gould, 87 year old male, is tentatively scheduled this afternoon for an image-guided right lower quadrant fluid collection aspiration with drain placement. His coumadin has been held since admission. His most recent INR was 4.1 and he is currently getting infused with FFP. The procedure was discussed with the patient and his wife at the bedside.   Risks and benefits discussed with the patient including bleeding, infection, damage to adjacent structures, bowel  perforation/fistula connection, and sepsis.  All of the patient's questions were answered, patient is agreeable to proceed.  Consent signed and in IR.   Thank you for this interesting consult.  I greatly enjoyed meeting Anthony Gould and look forward to participating in their care.  A copy of this report was sent to the requesting provider on this date.  Electronically Signed: Soyla Dryer, AGACNP-BC [phone] 12/30/2020, 3:13 PM   I spent a total of 20 Minutes    in face to face in clinical consultation, greater than 50% of which was counseling/coordinating care for RLQ  fluid collection aspiration with drain placement.

## 2020-12-30 NOTE — Progress Notes (Signed)
[facility] Progress Note     Subjective: CC:  Reports his RLQ discomfort is stable. Denies nausea or vomiting .denies  fever. Having flatus and non-bloody BMs.  Objective: Vital signs in last 24 hours: Temp:  [97.5 F (36.4 C)-98.1 F (36.7 C)] 97.7 F (36.5 C) (10/07 1049) Pulse Rate:  [59-88] 73 (10/07 1049) Resp:  [14-23] 18 (10/07 1049) BP: (99-136)/(49-68) 125/58 (10/07 1049) SpO2:  [93 %-99 %] 96 % (10/07 1030) Weight:  [76.5 kg-77.1 kg] 76.5 kg (10/07 0157) Last BM Date: 12/29/20 (per patient)  Intake/Output from previous day: 10/06 0701 - 10/07 0700 In: 750 [IV Piggyback:750] Out: 400 [Urine:400] Intake/Output this shift: Total I/O In: 10 [I.V.:10] Out: -   PE: Gen:  Alert, NAD, pleasant Card:  Regular rate and rhythm Pulm:  Normal effort Abd: Soft, non distended, ecchymosis around umbilicus, mild RLQ pain without guarding. Skin: warm and dry, no rashes  Psych: A&Ox3   Lab Results:  Recent Labs    12/29/20 1520 12/29/20 2225 12/30/20 0506  WBC 7.8  --  4.9  HGB 9.5* 8.7* 9.0*  HCT 28.4* 27.0* 26.5*  PLT 359  --  322   BMET Recent Labs    12/29/20 1520 12/30/20 0506  NA 125* 130*  K 3.5 3.3*  CL 92* 98  CO2 23 25  GLUCOSE 144* 97  BUN 16 11  CREATININE 1.14 1.04  CALCIUM 8.2* 8.2*   PT/INR Recent Labs    12/29/20 1520 12/30/20 0506  LABPROT 40.9* 39.5*  INR 4.3* 4.1*   CMP     Component Value Date/Time   NA 130 (L) 12/30/2020 0506   K 3.3 (L) 12/30/2020 0506   CL 98 12/30/2020 0506   CO2 25 12/30/2020 0506   GLUCOSE 97 12/30/2020 0506   BUN 11 12/30/2020 0506   CREATININE 1.04 12/30/2020 0506   CREATININE 1.40 (H) 04/01/2015 1540   CALCIUM 8.2 (L) 12/30/2020 0506   PROT 6.0 (L) 12/29/2020 1520   ALBUMIN 2.7 (L) 12/29/2020 1520   AST 50 (H) 12/29/2020 1520   ALT 31 12/29/2020 1520   ALKPHOS 107 12/29/2020 1520   BILITOT 1.0 12/29/2020 1520   GFRNONAA >60 12/30/2020 0506   GFRAA 107 11/17/2007 1231   Lipase      Component Value Date/Time   LIPASE 8.0 (L) 12/28/2020 0840       Studies/Results: CT Abdomen Pelvis W Contrast  Result Date: 12/29/2020 CLINICAL DATA:  History of appendectomy 12/05/2020. Abdominal pain and fever. EXAM: CT ABDOMEN AND PELVIS WITH CONTRAST TECHNIQUE: Multidetector CT imaging of the abdomen and pelvis was performed using the standard protocol following bolus administration of intravenous contrast. CONTRAST:  130mL ISOVUE-300 IOPAMIDOL (ISOVUE-300) INJECTION 61% COMPARISON:  CT scan 12/02/2020 FINDINGS: Lower chest: Borderline cardiac enlargement but no pericardial effusion. Stable aortic calcifications. Prosthetic mitral valve is noted. Small bilateral pleural effusions with overlying atelectasis. Hepatobiliary: Stable advanced cirrhotic changes involving liver but no hepatic lesions are identified. No intrahepatic biliary dilatation. The gallbladder is surgically absent. No common bile duct dilatation. Pancreas: No mass, inflammation or ductal dilatation. Spleen: Normal size.  No focal lesions. Adrenals/Urinary Tract: Adrenal glands and kidneys are unremarkable stable. No worrisome renal lesions or hydronephrosis. The bladder is decompressed. No significant abnormality. Stomach/Bowel: The stomach, duodenum, small bowel and colon are unremarkable. No acute inflammatory process, mass lesions or obstructive findings. Vascular/Lymphatic: Stable atherosclerotic calcifications involving the aorta and iliac arteries but no aneurysm dissection. Branch vessels are patent. Major venous structures are patent.  Reproductive: The prostate gland and seminal vesicles are unremarkable. Other: There is a rim enhancing abscess in the right lower quadrant measuring approximately 7.7 x 6.6 x 6.4 cm. This is just inferior to the cecum and adjacent to the terminal ileum. Some surrounding inflammatory phlegmon. There is also a small amount non rim enhancing fluid in the pelvis but no discrete pelvic abscess.  Musculoskeletal: No significant bony findings. Chronic calcific iliopsoas tendinopathy on the right side. IMPRESSION: 1. 7.7 x 6.6 x 6.4 cm rim enhancing abscess in the right lower quadrant. 2. Stable advanced cirrhotic changes involving liver but no hepatic lesions. 3. Small bilateral pleural effusions with overlying atelectasis. 4. Status post cholecystectomy. No biliary dilatation. These results will be called to the ordering clinician or representative by the Radiologist Assistant, and communication documented in the PACS or [employer]. Aortic Atherosclerosis (ICD10-I70.0). Electronically Signed   By: Marijo Sanes M.D.   On: 12/29/2020 09:06   DG CHEST PORT 1 VIEW  Result Date: 12/29/2020 CLINICAL DATA:  History of recent appendectomy with known abscess EXAM: PORTABLE CHEST 1 VIEW COMPARISON:  11/14/2015 FINDINGS: Cardiac shadow is enlarged. Postsurgical changes are noted with valve replacement. The lungs are clear bilaterally. No bony abnormality is noted. IMPRESSION: No active disease. Electronically Signed   By: Inez Catalina M.D.   On: 12/29/2020 22:49    Anti-infectives: Anti-infectives (From admission, onward)    Start     Dose/Rate Route Frequency Ordered Stop   12/29/20 1600  piperacillin-tazobactam (ZOSYN) IVPB 3.375 g        3.375 g 12.5 mL/hr over 240 Minutes Intravenous Every 8 hours 12/29/20 1519     12/29/20 1515  piperacillin-tazobactam (ZOSYN) IVPB 3.375 g  Status:  Discontinued        3.375 g 100 mL/hr over 30 Minutes Intravenous  Once 12/29/20 1511 12/29/20 1519        Assessment/Plan  POD#25 s/p Laparoscopic appendectomy for Acute appendicitis 9/12 Dr. Barry Dienes with RLQ fluid collection  - AFVSS, WBC 11.8 >7 > 4.9 - IR consult for possible aspiration/drainage of suspected hematoma or abscess - no acute surgical needs  - continue abx   ID - zosyn 10/6>> FEN - NPO for possible procedure. Ok for heart healthy diet, Ensure from [facility] perspective VTE - HOLD Coumadin,  would need to transition to heparin gtt once INR below 2, per medicine   Mechanical mitral valve replacement on coumadin at home - PT INR  Permanent A Fib HTN CAD CKD IIIa  CHF Cirrhosis    LOS: 1 day    Obie Dredge, [facility] Please see Amion for pager number during day hours 7:00am-4:30pm

## 2020-12-30 NOTE — Plan of Care (Signed)
  Problem: Pain Managment: Goal: General experience of comfort will improve 12/30/2020 2121 by Derrill Kay, RN Outcome: Progressing 12/30/2020 2119 by Derrill Kay, RN Outcome: Progressing

## 2020-12-30 NOTE — Plan of Care (Signed)
  Problem: Pain Managment: Goal: General experience of comfort will improve Outcome: Progressing   

## 2020-12-30 NOTE — Progress Notes (Signed)
0110 Received report from [facility]unt Patient arrived to room 6N14. Patient is A/O x4, vital signs stable, patient oriented to room, bed in low position, and call button within reach.

## 2020-12-30 NOTE — Progress Notes (Signed)
Progress Note    Anthony Gould   MRN:[id number]  DOB: 11/25/1933  DOA: 12/29/2020     1 Date of Service: 12/30/2020     Subjective:  No acute complaint.  No nausea no vomiting or no fever no chills.  Abdominal pain resolved.  Hospital Problems Postoperative abscess: Acute. Patient presents with complaints of continued right lower quadrant abdominal pain he describes as soreness unchanged from when he initially presented with appendicitis s/p appendectomy on 9/12.   CT scan of the abdomen revealed 7.7 x 6.6 x 6.4 rim-enhancing abscess in the right lower quadrant.   Continue IV antibiotics. Neurosurgery consulted. CT-guided drain placement recommended. IR initially requested to give the patient 3 FFP as soon as possible so that they can perform the procedure on 10/7 but later in the day somehow canceled the procedure. Patient will be n.p.o. after midnight. Patient was started on IV heparin. Anticipating IR to tell us regarding timing of the surgery. Follow-up blood culture   Supratherapeutic INR mechanical mitral valve permanent atrial fibrillation:  Patient found to be in atrial fibrillation but rate controlled.  INR was noted to be elevated 4.4->4.3.  Coumadin currently on hold due to supratherapeutic INR. Hold Coumadin, aspirin, and dipyridamole and resume when medically appropriate Patient was given vitamin K 2.5 mg Goal INR between 2.5 and 3.5. -Heparin per pharmacy for bridging   Normocytic anemia: Acute on chronic.   Hemoglobin 10.4->9.5.   Patient's previous hospitalization was complicated by postoperative bleeding had tried to restart patient on anticoagulation.  Question possibility of dilutional effect because all 3 cell lines decreased. -Type and screen for possible need of blood products -Continue to monitor H&H -Transfuse blood products as needed for hemoglobin 7.   Leukocytosis: Resolved.  WBC 11.8->7.8.  Patient is otherwise afebrile with no other SIRS  criteria met.   Hyponatremia: Acute.  Sodium level 130->125.  Patient reports decreased intake due to persistent nausea.  Suspect possibly hypovolemic hyponatremia.   Essential hypertension: Blood pressure currently maintained.  Blood pressure regimen includes bisoprolol 2.5 mg nightly, enalapril 20 mg daily, and amlodipine 5 mg every morning, and furosemide 40 mg daily. -Continue bisoprolol, enalapril, and amlodipine as tolerated -Initially held furosemide   Chronic diastolic congestive heart failure: Last EF noted to be around 55 to 60% back in 2020.  Patient does not appear fluid overloaded at this time. -Strict I&Os -Daily weights   Hyperlipidemia -Continue atorvastatin   Gout -Continue allopurinol   GERD -Continue Protonix   Objective Vital signs were reviewed and unremarkable.  Vitals:   12/30/20 1530 12/30/20 1542 12/30/20 1626 12/30/20 2009  BP: (!) 127/58 127/78 126/67 139/71  Pulse: 69 71 79 82  Resp: 18 18 17 18  Temp: 97.9 F (36.6 C) 98 F (36.7 C) 97.9 F (36.6 C) 98.2 F (36.8 C)  TempSrc: Oral Oral Oral Oral  SpO2: (P) 96%  98% 98%  Weight:      Height:       76.5 kg  Exam Physical Exam Constitutional:      Appearance: Normal appearance.  HENT:     Mouth/Throat:     Mouth: Mucous membranes are moist.     Pharynx: Oropharynx is clear.  Eyes:     Pupils: Pupils are equal, round, and reactive to light.  Cardiovascular:     Rate and Rhythm: Normal rate and regular rhythm.     Heart sounds: No murmur heard. Pulmonary:     Effort: Pulmonary effort is normal.       Breath sounds: Normal breath sounds.  Abdominal:     General: Bowel sounds are normal.     Palpations: Abdomen is soft.  Musculoskeletal:        General: No swelling.  Neurological:     General: No focal deficit present.     Mental Status: He is alert and oriented to person, place, and time.     Labs / Other Information My review of labs, imaging, notes and other tests shows no new  significant findings.  INR still supratherapeutic.    Time spent: 35 min [facility] 12/30/2020, 9:19 PM

## 2020-12-30 NOTE — Progress Notes (Signed)
ANTICOAGULATION CONSULT NOTE - Follow Up Consult  Pharmacy Consult for Heparin Indication:  MVR  Allergies  Allergen Reactions   Diltiazem Other (See Comments)    "Bradycardia," per pt; tolerates Amlodipine   Sulfa Antibiotics Rash and Other (See Comments)    Reaction not fully recalled    Sulfonamide Derivatives Rash and Other (See Comments)    Reaction not fully recalled     Patient Measurements: Height: 5\' 10"  (177.8 cm) Weight: 76.5 kg (168 lb 10.4 oz) IBW/kg (Calculated) : 73 Heparin Dosing Weight:  76.5 kg  Vital Signs: Temp: 97.9 F (36.6 C) (10/07 1626) Temp Source: Oral (10/07 1626) BP: 126/67 (10/07 1626) Pulse Rate: 79 (10/07 1626)  Labs: Recent Labs    12/28/20 0840 12/29/20 1520 12/29/20 2225 12/30/20 0506 12/30/20 1752  HGB 10.4* 9.5* 8.7* 9.0*  --   HCT 31.5* 28.4* 27.0* 26.5*  --   PLT 425.0* 359  --  322  --   LABPROT 44.3* 40.9*  --  39.5* 19.1*  INR 4.4* 4.3*  --  4.1* 1.6*  CREATININE 1.08 1.14  --  1.04  --      Estimated Creatinine Clearance: 53.6 mL/min (by C-G formula based on SCr of 1.04 mg/dL).   Assessment: CC/HPI: RLQ abdominal soreness.  CT scan of the abdomen and pelvis revealed a 7.7 x 6.6 x 6.4 rim-enhancing abscess in the right lower quadrant and small bilateral pleural effusions with overlying atelectasis   - He had just recently been hospitalized from 9/9-9/25 after presenting complaints of abdominal soreness found to have acute appendicitis. Appendectomy 4/81 complicated by post-op bleeding. Plans are for possible aspiration/drainage of suspected hematoma or abscess  PMH: atrial fibrillation on chronic anticoagulation, mitral valve replacement 1986, hypertension, hyperlipidemia, diabetes mellitus type 2, CKD3, peripheral edema, pulmonary HTN, syncope, TIA, HF, gout, gERD   Anticoag: Warf PTA for afib/TIA. Last dose 10/5. Hg= 9, INR 4.1> 1.6 s/p FFP  - PTA Coumadin 2.5 mg (5 mg x 0.5) every Mon; 5 mg (5 mg x 1) all other  days.. INR goal per last anti-coag note 3-3.5  Goal of Therapy:  Heparin level 0.3-0.7 units/ml Monitor platelets by anticoagulation protocol: Yes   Plan:  -No heparin bolus to due concern of hematoma -Start heparin at 1050 units/hr -Heparin level in 8 hours and daily wth CBC daily  Hildred Laser, PharmD Clinical Pharmacist **Pharmacist phone directory can now be found on [email or web address] ([email or web address]).  Listed under [place].

## 2020-12-31 ENCOUNTER — Inpatient Hospital Stay (HOSPITAL_COMMUNITY): Payer: Medicare Other

## 2020-12-31 LAB — CBC
HCT: 25.5 % — ABNORMAL LOW (ref 39.0–52.0)
Hemoglobin: 8.3 g/dL — ABNORMAL LOW (ref 13.0–17.0)
MCH: 31.2 pg (ref 26.0–34.0)
MCHC: 32.5 g/dL (ref 30.0–36.0)
MCV: 95.9 fL (ref 80.0–100.0)
Platelets: 313 10*3/uL (ref 150–400)
RBC: 2.66 MIL/uL — ABNORMAL LOW (ref 4.22–5.81)
RDW: 15.2 % (ref 11.5–15.5)
WBC: 4.4 10*3/uL (ref 4.0–10.5)
nRBC: 0 % (ref 0.0–0.2)

## 2020-12-31 LAB — HEPARIN LEVEL (UNFRACTIONATED)
Heparin Unfractionated: <0.1 IU/mL — ABNORMAL LOW (ref 0.30–0.70)
Heparin Unfractionated: <0.1 IU/mL — ABNORMAL LOW (ref 0.30–0.70)
Heparin Unfractionated: 0.46 IU/mL (ref 0.30–0.70)

## 2020-12-31 LAB — BPAM FFP
Blood Product Expiration Date: 202210072359
Blood Product Expiration Date: 202210072359
Blood Product Expiration Date: 202210072359
ISSUE DATE / TIME: 202210071016
ISSUE DATE / TIME: 202210071331
ISSUE DATE / TIME: 202210071524
Unit Type and Rh: 600
Unit Type and Rh: 6200
Unit Type and Rh: 6200

## 2020-12-31 LAB — PREPARE FRESH FROZEN PLASMA
Unit division: 0
Unit division: 0
Unit division: 0

## 2020-12-31 LAB — PROTIME-INR
INR: 1.7 — ABNORMAL HIGH (ref 0.8–1.2)
Prothrombin Time: 19.6 seconds — ABNORMAL HIGH (ref 11.4–15.2)

## 2020-12-31 MED ORDER — FENTANYL CITRATE (PF) 100 MCG/2ML IJ SOLN
INTRAMUSCULAR | Status: AC
  Filled 2020-12-31: qty 4

## 2020-12-31 MED ORDER — TRAMADOL HCL 50 MG PO TABS: 50.0000 mg | ORAL_TABLET | Freq: Four times a day (QID) | ORAL | Status: DC | PRN

## 2020-12-31 MED ORDER — FENTANYL CITRATE (PF) 100 MCG/2ML IJ SOLN
INTRAMUSCULAR | Status: DC | PRN
  Administered 2020-12-31 (×2): 50 ug via INTRAVENOUS

## 2020-12-31 MED ORDER — MIDAZOLAM HCL 2 MG/2ML IJ SOLN
INTRAMUSCULAR | Status: DC | PRN
  Administered 2020-12-31 (×2): 1 mg via INTRAVENOUS

## 2020-12-31 MED ORDER — FUROSEMIDE 40 MG PO TABS
40.0000 mg | ORAL_TABLET | Freq: Every day | ORAL | Status: DC
  Administered 2020-12-31 – 2021-01-03 (×4): 40 mg via ORAL
  Filled 2020-12-31 (×4): qty 1

## 2020-12-31 MED ORDER — MIDAZOLAM HCL 2 MG/2ML IJ SOLN
INTRAMUSCULAR | Status: AC
  Filled 2020-12-31: qty 4

## 2020-12-31 MED ORDER — MORPHINE SULFATE (PF) 2 MG/ML IV SOLN: 2.0000 mg | INTRAVENOUS | Status: DC | PRN

## 2020-12-31 MED ORDER — LIDOCAINE HCL 1 % IJ SOLN
INTRAMUSCULAR | Status: AC
  Filled 2020-12-31: qty 10

## 2020-12-31 NOTE — Progress Notes (Signed)
Assessment & Plan: Status post laparoscopic appendectomy for acute appendicitis - 12/05/20 Dr. Barry Dienes - AFVSS, WBC 11.8 >7 > 4.9 - IR placed drain this AM - appears to be hematoma - no acute surgical needs  - continue abx - resume diet   ID - zosyn 10/6>> FEN - Ok for heart healthy diet, Ensure VTE - per medicine   Mechanical mitral valve replacement on coumadin at home Permanent A Fib HTN CAD CKD IIIa  CHF Cirrhosis   Anticipate home when stable and tolerating diet.  Follow up to be arranged with IR drain clinic.  Will arrange follow up with [facility] Dr. Barry Dienes in office 2 weeks.        Armandina Gemma, MD       [facility]       Office: [phone]   Chief Complaint: Abdominal pain, fluid collection after appendectomy  Subjective: Patient just back from IR, drain placed in apparent hematoma  Objective: Vital signs in last 24 hours: Temp:  [97.8 F (36.6 C)-98.3 F (36.8 C)] 98.1 F (36.7 C) (10/08 1039) Pulse Rate:  [68-94] 71 (10/08 1039) Resp:  [12-23] 20 (10/08 1039) BP: (112-147)/(45-89) 125/54 (10/08 1039) SpO2:  [90 %-100 %] 97 % (10/08 1039) Last BM Date: 12/29/20  Intake/Output from previous day: 10/07 0701 - 10/08 0700 In: 582.3 [I.V.:50.3; Blood:354; IV Piggyback:178] Out: 76 [Urine:400; Drains:20] Intake/Output this shift: Total I/O In: -  Out: 100 [Urine:100]  Physical Exam: HEENT - sclerae clear, mucous membranes moist Neck - soft Abdomen - soft without distension; minimal tenderness; JP drain with dark fluid in tubing Ext - no edema, non-tender Neuro - alert & oriented, no focal deficits  Lab Results:  Recent Labs    12/30/20 0506 12/31/20 0025  WBC 4.9 4.4  HGB 9.0* 8.3*  HCT 26.5* 25.5*  PLT 322 313   BMET Recent Labs    12/29/20 1520 12/30/20 0506  NA 125* 130*  K 3.5 3.3*  CL 92* 98  CO2 23 25  GLUCOSE 144* 97  BUN 16 11  CREATININE 1.14 1.04  CALCIUM 8.2* 8.2*   PT/INR Recent Labs     12/30/20 1752 12/31/20 0025  LABPROT 19.1* 19.6*  INR 1.6* 1.7*   Comprehensive Metabolic Panel:    Component Value Date/Time   NA 130 (L) 12/30/2020 0506   NA 125 (L) 12/29/2020 1520   K 3.3 (L) 12/30/2020 0506   K 3.5 12/29/2020 1520   CL 98 12/30/2020 0506   CL 92 (L) 12/29/2020 1520   CO2 25 12/30/2020 0506   CO2 23 12/29/2020 1520   BUN 11 12/30/2020 0506   BUN 16 12/29/2020 1520   CREATININE 1.04 12/30/2020 0506   CREATININE 1.14 12/29/2020 1520   CREATININE 1.40 (H) 04/01/2015 1540   GLUCOSE 97 12/30/2020 0506   GLUCOSE 144 (H) 12/29/2020 1520   CALCIUM 8.2 (L) 12/30/2020 0506   CALCIUM 8.2 (L) 12/29/2020 1520   AST 50 (H) 12/29/2020 1520   AST 42 (H) 12/28/2020 0840   ALT 31 12/29/2020 1520   ALT 28 12/28/2020 0840   ALKPHOS 107 12/29/2020 1520   ALKPHOS 106 12/28/2020 0840   BILITOT 1.0 12/29/2020 1520   BILITOT 1.2 12/28/2020 0840   PROT 6.0 (L) 12/29/2020 1520   PROT 6.5 12/28/2020 0840   ALBUMIN 2.7 (L) 12/29/2020 1520   ALBUMIN 3.2 (L) 12/28/2020 0840    Studies/Results: DG CHEST PORT 1 VIEW  Result Date: 12/29/2020 CLINICAL DATA:  History  of recent appendectomy with known abscess EXAM: PORTABLE CHEST 1 VIEW COMPARISON:  11/14/2015 FINDINGS: Cardiac shadow is enlarged. Postsurgical changes are noted with valve replacement. The lungs are clear bilaterally. No bony abnormality is noted. IMPRESSION: No active disease. Electronically Signed   By: Inez Catalina M.D.   On: 12/29/2020 22:49      Armandina Gemma 12/31/2020   Patient ID: Anthony Gould, male   DOB: 19-Jun-1933, 87 y.o.   MRN: [id number]

## 2020-12-31 NOTE — Progress Notes (Signed)
ANTICOAGULATION CONSULT NOTE - Follow Up Consult  Pharmacy Consult for Heparin (while INR is low) Indication:  MVR  Allergies  Allergen Reactions   Diltiazem Other (See Comments)    "Bradycardia," per pt; tolerates Amlodipine   Sulfa Antibiotics Rash and Other (See Comments)    Reaction not fully recalled    Sulfonamide Derivatives Rash and Other (See Comments)    Reaction not fully recalled     Patient Measurements: Height: 5\' 10"  (177.8 cm) Weight: 76.5 kg (168 lb 10.4 oz) IBW/kg (Calculated) : 73 Heparin Dosing Weight:  76.5 kg  Vital Signs: Temp: 98.1 F (36.7 C) (10/08 1039) Temp Source: Oral (10/08 1039) BP: 125/54 (10/08 1039) Pulse Rate: 71 (10/08 1039)  Labs: Recent Labs    12/29/20 1520 12/29/20 2225 12/30/20 0506 12/30/20 1752 12/31/20 0025 12/31/20 1118  HGB 9.5* 8.7* 9.0*  --  8.3*  --   HCT 28.4* 27.0* 26.5*  --  25.5*  --   PLT 359  --  322  --  313  --   LABPROT 40.9*  --  39.5* 19.1* 19.6*  --   INR 4.3*  --  4.1* 1.6* 1.7*  --   HEPARINUNFRC  --   --   --   --  <0.10* <0.10*  CREATININE 1.14  --  1.04  --   --   --      Estimated Creatinine Clearance: 53.6 mL/min (by C-G formula based on SCr of 1.04 mg/dL).   Assessment: CC/HPI: RLQ abdominal soreness.  CT scan of the abdomen and pelvis revealed a 7.7 x 6.6 x 6.4 rim-enhancing abscess in the right lower quadrant and small bilateral pleural effusions with overlying atelectasis   - He had just recently been hospitalized from 9/9-9/25 after presenting complaints of abdominal soreness found to have acute appendicitis. Appendectomy 3/32 complicated by post-op bleeding. Plans are for possible aspiration/drainage of suspected hematoma or abscess  PMH: atrial fibrillation on chronic anticoagulation, mitral valve replacement 1986, hypertension, hyperlipidemia, diabetes mellitus type 2, CKD3, peripheral edema, pulmonary HTN, syncope, TIA, HF, gout, gERD   Anticoag: Warf PTA for afib/TIA. Last dose  10/5. Hg= 9, INR 4.1> 1.6 s/p FFP  - PTA Coumadin 2.5 mg (5 mg x 0.5) every Mon; 5 mg (5 mg x 1) all other days.. INR goal per last anti-coag note 3-3.5  10/8 PM update:  Heparin level currently undetectable, Hgb trending down to 8.3 No signs of bleeding IR placed drain this AM, no acute surgical need    Goal of Therapy:  Heparin level 0.3-0.7 units/ml Monitor platelets by anticoagulation protocol: Yes   Plan:  -No heparin boluses to due to hematoma -Inc heparin to 1400 units/hr -2100 heparin level - monitor for signs and sx of bleeding   Lovena Le E. [place], Stage manager

## 2020-12-31 NOTE — Assessment & Plan Note (Signed)
Continue statin. 

## 2020-12-31 NOTE — Assessment & Plan Note (Signed)
Likely from poor p.o. intake.  Resolved.

## 2020-12-31 NOTE — Progress Notes (Signed)
  Progress Note    Anthony Gould   IRW:[id number]  DOB: [birth year]  DOA: 12/29/2020     2 Date of Service: 12/31/2020     Subjective:  No nausea no vomiting.  No fever no chills.  No chest pain abdominal pain.  Hospital Problems * Postoperative abscess Patient presents with complaints of continued right lower quadrant abdominal pain he describes as soreness unchanged from when he initially presented with appendicitis s/p appendectomy on 9/12.   CT scan of the abdomen revealed 7.7 x 6.6 x 6.4 rim-enhancing abscess in the right lower quadrant.   Continue IV antibiotics.  General surgery consulted. CT-guided drain placement recommended. Patient was advised to be on 10/7.  Procedure was canceled. Underwent IR guided drain placement 10/8. Follow-up on cultures.    Supratherapeutic INR See mitral valve replacement  Long term (current) use of anticoagulants See mitral valve replacement.  MITRAL VALVE REPLACEMENT, HX OF Mechanical.  On heparin at home INR 2.5-3.5.  Received vitamin K as well as FFP. Receiving IV heparin for now. Resume Coumadin likely tomorrow as long as no bleeding. On aspirin and Aggrenox currently on hold.  PAF (paroxysmal atrial fibrillation) (HCC) Currently rate controlled.  On Coumadin at home.  Currently on IV heparin.  Resume Coumadin likely tomorrow.  Hypokalemia Improving.  Replaced.  Hyponatremia Likely from poor p.o. intake.  Resolved.  Chronic diastolic heart failure (HCC) Volume status mildly elevated.  Resuming home regimen.  Iron deficiency anemia H&H stable.  Monitor.  Had recent hospitalization in which she had bleeding postop.  Essential hypertension Blood pressure stable.  Continue bisoprolol, enalapril, Norvasc.  Hyperlipidemia Continue statin.  Gout Continue allopurinol.     Objective Vital signs were reviewed and unremarkable.  Vitals:   12/31/20 1015 12/31/20 1018 12/31/20 1039 12/31/20 1719  BP: 126/64 126/60 (!)  125/54 (!) 133/55  Pulse: 76 71 71 69  Resp: 17 (!) 21 20 18   Temp:   98.1 F (36.7 C) 98 F (36.7 C)  TempSrc:   Oral Oral  SpO2: 97% 98% 97% 95%  Weight:      Height:       76.5 kg  Exam General: Appear in mild distress, no Rash; Oral Mucosa Clear, moist. no Abnormal Neck Mass Or lumps, Conjunctiva normal  Cardiovascular: S1 and S2 Present, aortic systolic Murmur, Respiratory: good respiratory effort, Bilateral Air entry present and CTA, no Crackles, no wheezes Abdomen: Bowel Sound present, Soft and no tenderness Extremities: bilateral  Pedal edema Neurology: alert and oriented to time, place, and person affect appropriate. no new focal deficit Gait not checked due to patient safety concerns    Labs / Other Information My review of labs, imaging, notes and other tests shows no new significant findings.     Time spent: 35 mins  [facility] 12/31/2020, 8:09 PM

## 2020-12-31 NOTE — Progress Notes (Signed)
ANTICOAGULATION CONSULT NOTE - Follow Up Consult  Pharmacy Consult for Heparin (while INR is low) Indication:  MVR  Allergies  Allergen Reactions   Diltiazem Other (See Comments)    "Bradycardia," per pt; tolerates Amlodipine   Sulfa Antibiotics Rash and Other (See Comments)    Reaction not fully recalled    Sulfonamide Derivatives Rash and Other (See Comments)    Reaction not fully recalled     Patient Measurements: Height: 5\' 10"  (177.8 cm) Weight: 76.5 kg (168 lb 10.4 oz) IBW/kg (Calculated) : 73 Heparin Dosing Weight:  76.5 kg  Vital Signs: Temp: 98.3 F (36.8 C) (10/08 0010) Temp Source: Oral (10/08 0010) BP: 112/56 (10/08 0010) Pulse Rate: 74 (10/08 0010)  Labs: Recent Labs    12/28/20 0840 12/29/20 1520 12/29/20 2225 12/30/20 0506 12/30/20 1752 12/31/20 0025  HGB 10.4* 9.5* 8.7* 9.0*  --  8.3*  HCT 31.5* 28.4* 27.0* 26.5*  --  25.5*  PLT 425.0* 359  --  322  --  313  LABPROT 44.3* 40.9*  --  39.5* 19.1*  --   INR 4.4* 4.3*  --  4.1* 1.6*  --   HEPARINUNFRC  --   --   --   --   --  <0.10*  CREATININE 1.08 1.14  --  1.04  --   --      Estimated Creatinine Clearance: 53.6 mL/min (by C-G formula based on SCr of 1.04 mg/dL).   Assessment: CC/HPI: RLQ abdominal soreness.  CT scan of the abdomen and pelvis revealed a 7.7 x 6.6 x 6.4 rim-enhancing abscess in the right lower quadrant and small bilateral pleural effusions with overlying atelectasis   - He had just recently been hospitalized from 9/9-9/25 after presenting complaints of abdominal soreness found to have acute appendicitis. Appendectomy 0/92 complicated by post-op bleeding. Plans are for possible aspiration/drainage of suspected hematoma or abscess  PMH: atrial fibrillation on chronic anticoagulation, mitral valve replacement 1986, hypertension, hyperlipidemia, diabetes mellitus type 2, CKD3, peripheral edema, pulmonary HTN, syncope, TIA, HF, gout, gERD   Anticoag: Warf PTA for afib/TIA. Last dose  10/5. Hg= 9, INR 4.1> 1.6 s/p FFP  - PTA Coumadin 2.5 mg (5 mg x 0.5) every Mon; 5 mg (5 mg x 1) all other days.. INR goal per last anti-coag note 3-3.5  10/8 AM update:  Heparin level undetectable Drawn a couple hours early Hgb 8.3  Goal of Therapy:  Heparin level 0.3-0.7 units/ml Monitor platelets by anticoagulation protocol: Yes   Plan:  -No heparin boluses to due concern of hematoma -Inc heparin to 1150 units/hr -1000 heparin level  Narda Bonds, PharmD, BCPS Clinical Pharmacist Phone: [phone]

## 2020-12-31 NOTE — Plan of Care (Signed)
  Problem: Education: Goal: Knowledge of General Education information will improve Description Including pain rating scale, medication(s)/side effects and non-pharmacologic comfort measures Outcome: Progressing   

## 2020-12-31 NOTE — Assessment & Plan Note (Signed)
Continue allopurinol 

## 2020-12-31 NOTE — Assessment & Plan Note (Signed)
H&H stable.  Monitor.  Had recent hospitalization in which she had bleeding postop.

## 2020-12-31 NOTE — Assessment & Plan Note (Signed)
Blood pressure stable.  Continue bisoprolol, enalapril, Norvasc.

## 2020-12-31 NOTE — Assessment & Plan Note (Signed)
Improving.  Replaced.

## 2020-12-31 NOTE — Assessment & Plan Note (Addendum)
Mechanical.  On heparin at home INR 2.5-3.5.  Received vitamin K as well as FFP. Transition to Lovenox warfarin.  reintroduce aspirin and Aggrenox.

## 2020-12-31 NOTE — Progress Notes (Signed)
ANTICOAGULATION CONSULT NOTE - Follow Up Consult  Pharmacy Consult for Heparin (while INR is low) Indication:  MVR  Allergies  Allergen Reactions   Diltiazem Other (See Comments)    "Bradycardia," per pt; tolerates Amlodipine   Sulfa Antibiotics Rash and Other (See Comments)    Reaction not fully recalled    Sulfonamide Derivatives Rash and Other (See Comments)    Reaction not fully recalled     Patient Measurements: Height: 5\' 10"  (177.8 cm) Weight: 76.5 kg (168 lb 10.4 oz) IBW/kg (Calculated) : 73 Heparin Dosing Weight:  76.5 kg  Vital Signs: Temp: 98.2 F (36.8 C) (10/08 2217) Temp Source: Oral (10/08 2217) BP: 132/62 (10/08 2217) Pulse Rate: 76 (10/08 2217)  Labs: Recent Labs    12/29/20 1520 12/29/20 2225 12/30/20 0506 12/30/20 1752 12/31/20 0025 12/31/20 1118 12/31/20 2153  HGB 9.5* 8.7* 9.0*  --  8.3*  --   --   HCT 28.4* 27.0* 26.5*  --  25.5*  --   --   PLT 359  --  322  --  313  --   --   LABPROT 40.9*  --  39.5* 19.1* 19.6*  --   --   INR 4.3*  --  4.1* 1.6* 1.7*  --   --   HEPARINUNFRC  --   --   --   --  <0.10* <0.10* 0.46  CREATININE 1.14  --  1.04  --   --   --   --      Estimated Creatinine Clearance: 53.6 mL/min (by C-G formula based on SCr of 1.04 mg/dL).   Assessment: CC/HPI: RLQ abdominal soreness.  CT scan of the abdomen and pelvis revealed a 7.7 x 6.6 x 6.4 rim-enhancing abscess in the right lower quadrant and small bilateral pleural effusions with overlying atelectasis   - He had just recently been hospitalized from 9/9-9/25 after presenting complaints of abdominal soreness found to have acute appendicitis. Appendectomy 7/12 complicated by post-op bleeding. Plans are for possible aspiration/drainage of suspected hematoma or abscess  PMH: atrial fibrillation on chronic anticoagulation, mitral valve replacement 1986, hypertension, hyperlipidemia, diabetes mellitus type 2, CKD3, peripheral edema, pulmonary HTN, syncope, TIA, HF, gout,  gERD   Anticoag: Warf PTA for afib/TIA. Last dose 10/5. Hg= 9, INR 4.1> 1.6 s/p FFP  PTA Coumadin 2.5 mg (5 mg x 0.5) every Mon; 5 mg (5 mg x 1) all other days.. INR goal per last anti-coag note 3-3.5  -On heparin at 1400 units/hr and heparin level at goal.     Goal of Therapy:  Heparin level 0.3-0.7 units/ml Monitor platelets by anticoagulation protocol: Yes   Plan:  -No heparin changes needed -Daily heparin level and CBC  Hildred Laser, PharmD Clinical Pharmacist **Pharmacist phone directory can now be found on [email or web address] ([email or web address]).  Listed under [place].

## 2020-12-31 NOTE — Assessment & Plan Note (Signed)
Volume status mildly elevated.  Resuming home regimen.

## 2020-12-31 NOTE — Assessment & Plan Note (Signed)
See mitral valve replacement

## 2020-12-31 NOTE — Assessment & Plan Note (Addendum)
Patient presents with complaints of continued right lower quadrant abdominal pain he describes as sorenessunchanged from when he initially presented with appendicitis s/pappendectomyon 9/12. CT scan of the abdomen revealed 7.7 x 6.6 x 6.4 rim-enhancing abscess in the right lower quadrant. Continue IV antibiotics.  General surgery consulted. CT-guided drain placement recommended. Patient was advised to be on 10/7.  Procedure was canceled. Underwent IR guided drain placement 10/8. Drain culture growing E. coli.  Per surgery this is infected hematoma.  2 weeks of antibiotic recommended. Based on culture changed to cefadroxil.

## 2020-12-31 NOTE — Procedures (Signed)
Interventional Radiology Procedure Note  Procedure: Image guided drain placement, RLQ.  54F pigtail drain. Seems to be hematoma.   Complications: None  EBL: None Sample: Culture sent  Recommendations: - Routine drain care, with sterile flushes, record output - follow up Cx - routine wound care - May restart [facility] as needed now - advance diet per primary  Signed,  Dulcy Fanny. Earleen Newport, DO

## 2020-12-31 NOTE — Assessment & Plan Note (Addendum)
Currently rate controlled.  On Coumadin at home.  Currently Lovenox warfarin.

## 2020-12-31 NOTE — Assessment & Plan Note (Signed)
See mitral valve replacement.

## 2021-01-01 LAB — CBC
HCT: 29.6 % — ABNORMAL LOW (ref 39.0–52.0)
Hemoglobin: 9.7 g/dL — ABNORMAL LOW (ref 13.0–17.0)
MCH: 31.4 pg (ref 26.0–34.0)
MCHC: 32.8 g/dL (ref 30.0–36.0)
MCV: 95.8 fL (ref 80.0–100.0)
Platelets: 332 10*3/uL (ref 150–400)
RBC: 3.09 MIL/uL — ABNORMAL LOW (ref 4.22–5.81)
RDW: 15.3 % (ref 11.5–15.5)
WBC: 5.2 10*3/uL (ref 4.0–10.5)
nRBC: 0 % (ref 0.0–0.2)

## 2021-01-01 LAB — HEPARIN LEVEL (UNFRACTIONATED)
Heparin Unfractionated: <0.1 IU/mL — ABNORMAL LOW (ref 0.30–0.70)
Heparin Unfractionated: 0.47 IU/mL (ref 0.30–0.70)

## 2021-01-01 NOTE — Progress Notes (Signed)
  Progress Note    Anthony Gould   TXM:[id number]  DOB: 22-Dec-1933  DOA: 12/29/2020     3 Date of Service: 01/01/2021   Subjective:  No nausea no vomiting no fever no chills.  Pain still present.  Breathing improving.  Hospital Problems * Postoperative abscess Patient presents with complaints of continued right lower quadrant abdominal pain he describes as soreness unchanged from when he initially presented with appendicitis s/p appendectomy on 9/12.   CT scan of the abdomen revealed 7.7 x 6.6 x 6.4 rim-enhancing abscess in the right lower quadrant.   Continue IV antibiotics.  General surgery consulted. CT-guided drain placement recommended. Patient was advised to be on 10/7.  Procedure was canceled. Underwent IR guided drain placement 10/8. Follow-up on cultures.  So far no growth.  Surgery considers this could be most likely hematoma.  Supratherapeutic INR See mitral valve replacement  Long term (current) use of anticoagulants See mitral valve replacement.  MITRAL VALVE REPLACEMENT, HX OF Mechanical.  On heparin at home INR 2.5-3.5.  Received vitamin K as well as FFP. Receiving IV heparin for now. On aspirin and Aggrenox currently on hold.  PAF (paroxysmal atrial fibrillation) (HCC) Currently rate controlled.  On Coumadin at home.  Currently on IV heparin.   Hypokalemia Improving.  Replaced.  Hyponatremia Likely from poor p.o. intake.  Resolved.  Chronic diastolic heart failure (HCC) Volume status mildly elevated.  Resuming home regimen.  Iron deficiency anemia H&H stable.  Monitor.  Had recent hospitalization in which she had bleeding postop.  Essential hypertension Blood pressure stable.  Continue bisoprolol, enalapril, Norvasc.  Hyperlipidemia Continue statin.  Gout Continue allopurinol.     Objective Vital signs were reviewed and unremarkable.  Vitals:   12/31/20 2217 01/01/21 0454 01/01/21 0800 01/01/21 1632  BP: 132/62 138/64 (!) 126/59 135/61   Pulse: 76 69 69 82  Resp: 16 15 16 20   Temp: 98.2 F (36.8 C) 97.6 F (36.4 C) 97.8 F (36.6 C) 97.8 F (36.6 C)  TempSrc: Oral Oral Oral Oral  SpO2: 95% 93% 94% 94%  Weight:      Height:       76.5 kg  Exam General: Appear in mild distress, no Rash; Oral Mucosa Clear, moist. no Abnormal Neck Mass Or lumps, Conjunctiva normal  Cardiovascular: S1 and S2 Present, aortic systolic  Murmur, Respiratory: good respiratory effort, Bilateral Air entry present and CTA, no Crackles, no wheezes Abdomen: Bowel Sound present, Soft and no tenderness Extremities: no Pedal edema Neurology: alert and oriented to time, place, and person affect appropriate. no new focal deficit Gait not checked due to patient safety concerns   Labs / Other Information My review of labs, imaging, notes and other tests shows no new significant findings.     Time spent: 35 mins [facility] 01/01/2021, 6:53 PM

## 2021-01-01 NOTE — Progress Notes (Addendum)
ANTICOAGULATION CONSULT NOTE - Follow Up Consult  Pharmacy Consult for Heparin (while INR is low) Indication:  MVR  Allergies  Allergen Reactions   Diltiazem Other (See Comments)    "Bradycardia," per pt; tolerates Amlodipine   Sulfa Antibiotics Rash and Other (See Comments)    Reaction not fully recalled    Sulfonamide Derivatives Rash and Other (See Comments)    Reaction not fully recalled     Patient Measurements: Height: 5\' 10"  (177.8 cm) Weight: 76.5 kg (168 lb 10.4 oz) IBW/kg (Calculated) : 73 Heparin Dosing Weight:  76.5 kg  Vital Signs: Temp: 97.8 F (36.6 C) (10/09 0800) Temp Source: Oral (10/09 0800) BP: 126/59 (10/09 0800) Pulse Rate: 69 (10/09 0800)  Labs: Recent Labs    12/29/20 1520 12/29/20 2225 12/30/20 0506 12/30/20 1752 12/31/20 0025 12/31/20 1118 12/31/20 2153 01/01/21 0146 01/01/21 1432  HGB 9.5*   < > 9.0*  --  8.3*  --   --  9.7*  --   HCT 28.4*   < > 26.5*  --  25.5*  --   --  29.6*  --   PLT 359  --  322  --  313  --   --  332  --   LABPROT 40.9*  --  39.5* 19.1* 19.6*  --   --   --   --   INR 4.3*  --  4.1* 1.6* 1.7*  --   --   --   --   HEPARINUNFRC  --   --   --   --  <0.10*   < > 0.46 <0.10* 0.47  CREATININE 1.14  --  1.04  --   --   --   --   --   --    < > = values in this interval not displayed.    Estimated Creatinine Clearance: 53.6 mL/min (by C-G formula based on SCr of 1.04 mg/dL).   Assessment: CC/HPI: RLQ abdominal soreness.  CT scan of the abdomen and pelvis revealed a 7.7 x 6.6 x 6.4 rim-enhancing abscess in the right lower quadrant and small bilateral pleural effusions with overlying atelectasis. Plans are for possible aspiration/drainage of suspected hematoma or abscess  PMH: atrial fibrillation on chronic anticoagulation, mitral valve replacement 1986, hypertension, hyperlipidemia, diabetes mellitus type 2, CKD3, peripheral edema, pulmonary HTN, syncope, TIA, HF, gout, gERD   Anticoag: Warf PTA for afib/TIA. Last  dose 10/5. Hg= 9, INR 4.1> 1.6 s/p FFP  - PTA Coumadin 2.5 mg (5 mg x 0.5) every Mon; 5 mg (5 mg x 1) all other days. INR goal per last anti-coag note 3-3.5  10/9 AM update:  Heparin level undetectable this AM after patient was therapeutic yesterday evening. Spoke with AM nurse who indicated heparin was not running when they arrived. Unsure of when heparin drip was stopped. Nurse restarted heparin at approximately 0700. No signs of bleeding and no other issues noted. Hgb trending up today to 9.7.   Addendum: Recheck heparin level this afternoon therapeutic at 0.47 after heparin restarted. No signs of bleeding or issues with lines today. Will follow up with AM heparin level on 10/10.   Goal of Therapy:  Heparin level 0.3-0.7 units/ml Monitor platelets by anticoagulation protocol: Yes   Plan:  - Continue heparin at 1400 units/hr - Daily HL and CBC  - Monitor for signs and sx of bleeding   Cephus Slater, PharmD, [facility] Pharmacy Resident [phone] 01/01/2021 3:08 PM

## 2021-01-01 NOTE — Plan of Care (Signed)
  Problem: Nutrition: Goal: Adequate nutrition will be maintained Outcome: Progressing   Problem: Pain Managment: Goal: General experience of comfort will improve Outcome: Progressing   Problem: Safety: Goal: Ability to remain free from injury will improve Outcome: Progressing   

## 2021-01-01 NOTE — Progress Notes (Signed)
Assessment & Plan: Status post laparoscopic appendectomy for acute appendicitis - 12/05/20 Dr. Barry Dienes - AFVSS, WBC normal - IR drain in place - consistent with hematoma - small output since placement - no acute surgical needs  - continue abx - regular diet   ID - zosyn 10/6>> FEN - heart healthy diet, Ensure VTE - per medicine   Mechanical mitral valve replacement on coumadin at home Permanent A Fib HTN CAD CKD IIIa  CHF Cirrhosis    Anticipate home when stable and tolerating diet.  Follow up to be arranged with IR drain clinic.  Will arrange follow up with [facility] Dr. Barry Dienes in office 2 weeks.        Armandina Gemma, MD       [facility]       Office: [phone]   Chief Complaint: Hematoma after appendectomy  Subjective: Patient up to chair, no complaints.  Objective: Vital signs in last 24 hours: Temp:  [97.6 F (36.4 C)-98.2 F (36.8 C)] 97.8 F (36.6 C) (10/09 0800) Pulse Rate:  [69-94] 69 (10/09 0800) Resp:  [12-23] 16 (10/09 0800) BP: (113-147)/(54-70) 126/59 (10/09 0800) SpO2:  [93 %-100 %] 94 % (10/09 0800) Last BM Date: 12/31/20  Intake/Output from previous day: 10/08 0701 - 10/09 0700 In: 120 [P.O.:120] Out: 630 [Urine:625; Drains:5] Intake/Output this shift: Total I/O In: -  Out: 150 [Urine:150]  Physical Exam: HEENT - sclerae clear, mucous membranes moist Neck - soft Abdomen - soft without distension; non-tender; drain with small dark output Ext - no edema, non-tender Neuro - alert & oriented, no focal deficits  Lab Results:  Recent Labs    12/31/20 0025 01/01/21 0146  WBC 4.4 5.2  HGB 8.3* 9.7*  HCT 25.5* 29.6*  PLT 313 332   BMET Recent Labs    12/29/20 1520 12/30/20 0506  NA 125* 130*  K 3.5 3.3*  CL 92* 98  CO2 23 25  GLUCOSE 144* 97  BUN 16 11  CREATININE 1.14 1.04  CALCIUM 8.2* 8.2*   PT/INR Recent Labs    12/30/20 1752 12/31/20 0025  LABPROT 19.1* 19.6*  INR 1.6* 1.7*   Comprehensive  Metabolic Panel:    Component Value Date/Time   NA 130 (L) 12/30/2020 0506   NA 125 (L) 12/29/2020 1520   K 3.3 (L) 12/30/2020 0506   K 3.5 12/29/2020 1520   CL 98 12/30/2020 0506   CL 92 (L) 12/29/2020 1520   CO2 25 12/30/2020 0506   CO2 23 12/29/2020 1520   BUN 11 12/30/2020 0506   BUN 16 12/29/2020 1520   CREATININE 1.04 12/30/2020 0506   CREATININE 1.14 12/29/2020 1520   CREATININE 1.40 (H) 04/01/2015 1540   GLUCOSE 97 12/30/2020 0506   GLUCOSE 144 (H) 12/29/2020 1520   CALCIUM 8.2 (L) 12/30/2020 0506   CALCIUM 8.2 (L) 12/29/2020 1520   AST 50 (H) 12/29/2020 1520   AST 42 (H) 12/28/2020 0840   ALT 31 12/29/2020 1520   ALT 28 12/28/2020 0840   ALKPHOS 107 12/29/2020 1520   ALKPHOS 106 12/28/2020 0840   BILITOT 1.0 12/29/2020 1520   BILITOT 1.2 12/28/2020 0840   PROT 6.0 (L) 12/29/2020 1520   PROT 6.5 12/28/2020 0840   ALBUMIN 2.7 (L) 12/29/2020 1520   ALBUMIN 3.2 (L) 12/28/2020 0840    Studies/Results: CT IMAGE GUIDED DRAINAGE BY PERCUTANEOUS CATHETER  Result Date: 12/31/2020 INDICATION: 87 year old male with a history of prior laparoscopic appendectomy, with right lower quadrant fluid collection  EXAM: CT GUIDED DRAINAGE OF  ABSCESS MEDICATIONS: The patient is currently admitted to the hospital and receiving intravenous antibiotics. The antibiotics were administered within an appropriate time frame prior to the initiation of the procedure. ANESTHESIA/SEDATION: 2.0 mg IV Versed 100 mcg IV Fentanyl Moderate Sedation Time:  15 minutes The patient was continuously monitored during the procedure by the interventional radiology nurse under my direct supervision. COMPLICATIONS: None TECHNIQUE: Informed written consent was obtained from the patient after a thorough discussion of the procedural risks, benefits and alternatives. All questions were addressed. Maximal Sterile Barrier Technique was utilized including caps, mask, sterile gowns, sterile gloves, sterile drape, hand hygiene  and skin antiseptic. A timeout was performed prior to the initiation of the procedure. PROCEDURE: The right lower quadrant was prepped with chlorhexidine in a sterile fashion, and a sterile drape was applied covering the operative field. A sterile gown and sterile gloves were used for the procedure. Local anesthesia was provided with 1% Lidocaine. Once the patient is prepped and draped and local anesthesia was applied, a Yueh needle was advanced with CT guidance into the fluid collection of the right lower quadrant. With aspiration hematoma, the catheter was advanced. Modified Seldinger technique was used to place a 12 [place] drain into the fluid collection. Aspiration of hematoma was performed with a sample sent for analysis. Catheter was sutured in position and attached to a bulb suction device. Patient tolerated the procedure well and remained hemodynamically stable throughout. No complications were encountered and no significant blood loss FINDINGS: The initial CT demonstrates rounded fluid collection with high density internal material in the right lower quadrant adjacent to the cecum. Images after the case demonstrate 12 French drain within the collection. IMPRESSION: Status post CT-guided drainage of hematoma/fluid collection of the right lower quadrant. Sample was sent for analysis. Signed, Dulcy Fanny. Dellia Nims, RPVI [facility] Inter[facility] Rad[facility] Electronically Signed   By: Corrie Mckusick D.O.   On: 12/31/2020 12:58      Armandina Gemma 01/01/2021   Patient ID: [name], male   DOB: September 30, 1933, 87 y.o.   MRN: [id number]

## 2021-01-02 LAB — CBC
HCT: 26.5 % — ABNORMAL LOW (ref 39.0–52.0)
Hemoglobin: 8.7 g/dL — ABNORMAL LOW (ref 13.0–17.0)
MCH: 31.5 pg (ref 26.0–34.0)
MCHC: 32.8 g/dL (ref 30.0–36.0)
MCV: 96 fL (ref 80.0–100.0)
Platelets: 300 10*3/uL (ref 150–400)
RBC: 2.76 MIL/uL — ABNORMAL LOW (ref 4.22–5.81)
RDW: 15.5 % (ref 11.5–15.5)
WBC: 5.9 10*3/uL (ref 4.0–10.5)
nRBC: 0 % (ref 0.0–0.2)

## 2021-01-02 LAB — PROTIME-INR
INR: 1.8 — ABNORMAL HIGH (ref 0.8–1.2)
Prothrombin Time: 20.9 seconds — ABNORMAL HIGH (ref 11.4–15.2)

## 2021-01-02 LAB — HEPARIN LEVEL (UNFRACTIONATED): Heparin Unfractionated: 0.41 IU/mL (ref 0.30–0.70)

## 2021-01-02 MED ORDER — WARFARIN SODIUM 5 MG PO TABS
5.0000 mg | ORAL_TABLET | Freq: Once | ORAL | Status: AC
  Administered 2021-01-02: 5 mg via ORAL
  Filled 2021-01-02: qty 1

## 2021-01-02 MED ORDER — ENOXAPARIN SODIUM 80 MG/0.8ML IJ SOSY
80.0000 mg | PREFILLED_SYRINGE | Freq: Two times a day (BID) | INTRAMUSCULAR | Status: DC
  Administered 2021-01-02 – 2021-01-03 (×3): 80 mg via SUBCUTANEOUS
  Filled 2021-01-02 (×3): qty 0.8

## 2021-01-02 MED ORDER — WARFARIN - PHARMACIST DOSING INPATIENT: Freq: Every day | Status: DC

## 2021-01-02 NOTE — Progress Notes (Addendum)
ANTICOAGULATION CONSULT NOTE - Follow Up Consult  Pharmacy Consult for Heparin to enoxaparin and Warfarin  Indication:  MVR , afib, TIA   Allergies  Allergen Reactions   Diltiazem Other (See Comments)    "Bradycardia," per pt; tolerates Amlodipine   Sulfa Antibiotics Rash and Other (See Comments)    Reaction not fully recalled    Sulfonamide Derivatives Rash and Other (See Comments)    Reaction not fully recalled     Patient Measurements: Height: 5\' 10"  (177.8 cm) Weight: 76.5 kg (168 lb 10.4 oz) IBW/kg (Calculated) : 73 Heparin Dosing Weight:  76.5 kg  Vital Signs: Temp: 98.3 F (36.8 C) (10/10 0759) Temp Source: Oral (10/10 0759) BP: 134/64 (10/10 0759) Pulse Rate: 72 (10/10 0759)  Labs: Recent Labs     0000 12/30/20 1752 12/31/20 0025 12/31/20 1118 01/01/21 0146 01/01/21 1432 01/02/21 0049  HGB   < >  --  8.3*  --  9.7*  --  8.7*  HCT  --   --  25.5*  --  29.6*  --  26.5*  PLT  --   --  313  --  332  --  300  LABPROT  --  19.1* 19.6*  --   --   --  20.9*  INR  --  1.6* 1.7*  --   --   --  1.8*  HEPARINUNFRC  --   --  <0.10*   < > <0.10* 0.47 0.41   < > = values in this interval not displayed.     Estimated Creatinine Clearance: 53.6 mL/min (by C-G formula based on SCr of 1.04 mg/dL).   Assessment: 87 yo M on warfarin PTA For mechanical MVR, Afib and TIA. INR on admission was 4.3; was just seen in Clinic on 10/5 with INR 4.4 and instructed to hold dose on 10/6. Warfarin held for procedures. Pharmacy consulted for heparin.    HL 0.41 is therapeutic. INR is 1.8. H/H, plt stable. Ok to restart warfarin per team. Patient has given himself enoxaparin before for bridging.   - PTA Coumadin 2.5 mg every Mon; 5 mg all other days (has 5mg  tablets). INR goal per last anti-coag note 3-3.5   Goal of Therapy:  Heparin level 0.3-0.7 units/ml INR 3 - 3.5 per outpatient [facility] notes (last seen 10/5) Monitor platelets by anticoagulation protocol: Yes   Plan:  Stop  heparin Start enoxaparin 80 mg Q12 hr  Warfarin 5mg  x1  Daily INR and CBC  Monitor for signs and sx of bleeding    Benetta Spar, PharmD, BCPS, BCCP Clinical Pharmacist  Please check AMION for all [place] phone numbers After 10:00 PM, call [place] [phone]

## 2021-01-02 NOTE — Progress Notes (Signed)
Referring Physician(s): * No referring provider recorded for this case *  Supervising Physician: Sandi Mariscal  Patient Status:  [facility] - In-pt  Chief Complaint:  RLQ abscess drain   Subjective:  Pt sitting upright in chair with wife at bedside. He states that he has mild tenderness at site when moving around.  Scant amt of bloody OP in JP drain woth 20 cc documented in Epic last 24 hours. Flushes easily Insertion site unremarkable, sutures and stat lock in place with no s/sx of infection.  Dressing has large amount dried blood, no active bleeding noted. RN states she will change dressing.   Allergies: Diltiazem, Sulfa antibiotics, and Sulfonamide derivatives  Medications: Prior to Admission medications   Medication Sig Start Date End Date Taking? Authorizing Provider  acetaminophen (TYLENOL) 500 MG tablet Take 500 mg by mouth every 6 (six) hours as needed for mild pain or headache.   Yes [provider]  allopurinol (ZYLOPRIM) 300 MG tablet TAKE 1 TABLET BY MOUTH DAILY AS NEEDED. GENERIC EQUIVALENT FOR ZYLOPRIM Patient taking differently: Take 300 mg by mouth daily. 04/14/20  Yes Plotnikov, Evie Lacks, MD  amLODipine (NORVASC) 5 MG tablet TAKE 1/2 TABLET BY MOUTH EVERY DAY FOR 3 DAYS,IF BLOOD PRESSURE IS STILL HIGHER THAN 130 THEN INCREASE TO 1 TABLET BY MOUTH DAILY Patient taking differently: Take 5 mg by mouth in the morning. 03/28/20  Yes Dunn, Nedra Hai, [name]  aspirin 81 MG EC tablet Take 81 mg by mouth daily.   Yes [provider]  atorvastatin (LIPITOR) 20 MG tablet TAKE 1 TABLET BY MOUTH TWO TIMES A WEEK Patient taking differently: Take 20 mg by mouth See admin instructions. Take 20 mg by mouth at bedtime on Mondays and Fridays 04/14/20  Yes Plotnikov, Evie Lacks, MD  bisoprolol (ZEBETA) 5 MG tablet Take 0.5 tablets (2.5 mg total) by mouth daily. Patient taking differently: Take 2.5 mg by mouth at bedtime. 12/08/19  Yes Dunn, Nedra Hai, [name]  Cholecalciferol  (VITAMIN D3) 50 MCG (2000 UT) capsule Take 1 capsule (2,000 Units total) by mouth daily. 10/01/19  Yes Plotnikov, Evie Lacks, MD  dipyridamole (PERSANTINE) 50 MG tablet Take 1 tablet (50 mg total) by mouth 3 (three) times daily. 08/08/20  Yes Burnell Blanks, MD  enalapril (VASOTEC) 20 MG tablet TAKE 1 TABLET BY MOUTH DAILY. Patient taking differently: Take 20 mg by mouth in the morning. 08/07/20  Yes Plotnikov, Evie Lacks, MD  furosemide (LASIX) 40 MG tablet TAKE 1 TABLET BY MOUTH DAILY GENERIC EQUIVALENT FOR LASIX Patient taking differently: Take 40 mg by mouth in the morning. 07/04/20  Yes Plotnikov, Evie Lacks, MD  latanoprost (XALATAN) 0.005 % ophthalmic solution Place 1 drop into both eyes at bedtime.   Yes [provider]  magnesium oxide (MAG-OX) 400 MG tablet TAKE 1 TABLET(400 MG) BY MOUTH TWICE DAILY Patient taking differently: Take 400 mg by mouth 2 (two) times daily. 04/14/20  Yes Dunn, [name] N, [name]  Milk Thistle 1000 MG CAPS Take 1,000 mg by mouth daily.   Yes [provider]  pantoprazole (PROTONIX) 40 MG tablet TAKE 1 TABLET BY MOUTH DAILY Patient taking differently: Take 40 mg by mouth daily before breakfast. 08/07/20  Yes Plotnikov, Evie Lacks, MD  ferrous [id number] C-folic acid (TRINSICON / FOLTRIN) capsule Take 1 capsule by mouth 2 (two) times daily after a meal. Patient not taking: Reported on 12/29/2020 12/18/20   Lorella Nimrod, MD  warfarin (COUMADIN) 5 MG tablet TAKE 1 TABLET BY  MOUTH AS DIRECTED BY THE COUMADIN CLINIC Patient not taking: No sig reported 06/27/20   Burnell Blanks, MD     Vital Signs: BP 134/64 (BP Location: Left Arm)   Pulse 72   Temp 98.3 F (36.8 C) (Oral)   Resp 18   Ht 5\' 10"  (1.778 m)   Wt 168 lb 10.4 oz (76.5 kg)   SpO2 92%   BMI 24.20 kg/m   Physical Exam Constitutional:      Appearance: Normal appearance. He is not ill-appearing.  HENT:     Head: Normocephalic and atraumatic.  Cardiovascular:     Rate  and Rhythm: Normal rate.  Pulmonary:     Effort: Pulmonary effort is normal.  Abdominal:     Comments: RLQ drain in place, site unremarkable  Skin:    General: Skin is warm and dry.  Neurological:     Mental Status: He is alert and oriented to person, place, and time.  Psychiatric:        Mood and Affect: Mood normal.        Behavior: Behavior normal.        Thought Content: Thought content normal.        Judgment: Judgment normal.    Imaging: DG CHEST PORT 1 VIEW  Result Date: 12/29/2020 CLINICAL DATA:  History of recent appendectomy with known abscess EXAM: PORTABLE CHEST 1 VIEW COMPARISON:  11/14/2015 FINDINGS: Cardiac shadow is enlarged. Postsurgical changes are noted with valve replacement. The lungs are clear bilaterally. No bony abnormality is noted. IMPRESSION: No active disease. Electronically Signed   By: Inez Catalina M.D.   On: 12/29/2020 22:49   CT IMAGE GUIDED DRAINAGE BY PERCUTANEOUS CATHETER  Result Date: 12/31/2020 INDICATION: 87 year old male with a history of prior laparoscopic appendectomy, with right lower quadrant fluid collection EXAM: CT GUIDED DRAINAGE OF  ABSCESS MEDICATIONS: The patient is currently admitted to the hospital and receiving intravenous antibiotics. The antibiotics were administered within an appropriate time frame prior to the initiation of the procedure. ANESTHESIA/SEDATION: 2.0 mg IV Versed 100 mcg IV Fentanyl Moderate Sedation Time:  15 minutes The patient was continuously monitored during the procedure by the interventional radiology nurse under my direct supervision. COMPLICATIONS: None TECHNIQUE: Informed written consent was obtained from the patient after a thorough discussion of the procedural risks, benefits and alternatives. All questions were addressed. Maximal Sterile Barrier Technique was utilized including caps, mask, sterile gowns, sterile gloves, sterile drape, hand hygiene and skin antiseptic. A timeout was performed prior to the  initiation of the procedure. PROCEDURE: The right lower quadrant was prepped with chlorhexidine in a sterile fashion, and a sterile drape was applied covering the operative field. A sterile gown and sterile gloves were used for the procedure. Local anesthesia was provided with 1% Lidocaine. Once the patient is prepped and draped and local anesthesia was applied, a Yueh needle was advanced with CT guidance into the fluid collection of the right lower quadrant. With aspiration hematoma, the catheter was advanced. Modified Seldinger technique was used to place a 12 [place] drain into the fluid collection. Aspiration of hematoma was performed with a sample sent for analysis. Catheter was sutured in position and attached to a bulb suction device. Patient tolerated the procedure well and remained hemodynamically stable throughout. No complications were encountered and no significant blood loss FINDINGS: The initial CT demonstrates rounded fluid collection with high density internal material in the right lower quadrant adjacent to the cecum. Images after the case demonstrate 12 [place]  drain within the collection. IMPRESSION: Status post CT-guided drainage of hematoma/fluid collection of the right lower quadrant. Sample was sent for analysis. Signed, Dulcy Fanny. Dellia Nims, RPVI Vascular and Interventional Radiology Specialists [facility] Radiology Electronically Signed   By: Corrie Mckusick D.O.   On: 12/31/2020 12:58    Labs:  CBC: Recent Labs    12/30/20 0506 12/31/20 0025 01/01/21 0146 01/02/21 0049  WBC 4.9 4.4 5.2 5.9  HGB 9.0* 8.3* 9.7* 8.7*  HCT 26.5* 25.5* 29.6* 26.5*  PLT 322 313 332 300    COAGS: Recent Labs    12/04/20 1652 12/05/20 0857 12/30/20 0506 12/30/20 1752 12/31/20 0025 01/02/21 0049  INR  --    < > 4.1* 1.6* 1.7* 1.8*  APTT 55*  --   --   --   --   --    < > = values in this interval not displayed.    BMP: Recent Labs    12/17/20 0351 12/18/20 0245 12/28/20 0840  12/29/20 1520 12/30/20 0506  NA 132* 133* 130* 125* 130*  K 3.7 3.7 3.7 3.5 3.3*  CL 100 100 93* 92* 98  CO2 25 25 27 23 25   GLUCOSE 93 104* 108* 144* 97  BUN 7* 8 15 16 11   CALCIUM 8.1* 8.2* 8.8 8.2* 8.2*  CREATININE 0.96 0.93 1.08 1.14 1.04  GFRNONAA >60 >60  --  >60 >60    LIVER FUNCTION TESTS: Recent Labs    12/03/20 0147 12/07/20 1001 12/28/20 0840 12/29/20 1520  BILITOT 1.9* 1.7* 1.2 1.0  AST 37 31 42* 50*  ALT 27 21 28 31   ALKPHOS 78 61 106 107  PROT 6.6 5.6* 6.5 6.0*  ALBUMIN 3.6 2.9* 3.2* 2.7*    Assessment and Plan:  Pt sitting upright in chair with wife at bedside. He states that he has mild tenderness at site when moving around but denies pain.  Scant amt of bloody OP in JP drain with 20 cc documented in Epic last 24 hours. Flushes easily. Insertion site unremarkable, sutures and stat lock in place with no s/sx of infection.  Dressing has large amount dried blood, no active bleeding noted. RN states she will change dressing.   WBC WNL Afebrile, VSS  Flush drain TID Change dressing q shift or as needed Continue to document OP in Epic Call IR with questions or concerns.   Electronically Signed: Tyson Alias, NP 01/02/2021, 3:05 PM   I spent a total of 15 Minutes at the the patient's bedside AND on the patient's hospital floor or unit, greater than 50% of which was counseling/coordinating care for RLQ abscess drain.

## 2021-01-02 NOTE — Progress Notes (Signed)
  Progress Note    Anthony Gould   XBW:[id number]  DOB: [birth year]  DOA: 12/29/2020     4 Date of Service: 01/02/2021   Subjective:  No nausea no vomiting.  No fever no chills.  No chest pain abdominal pain.  No diarrhea no constipation.  Hospital Problems * Postoperative infected hematoma due to E. coli Patient presents with complaints of continued right lower quadrant abdominal pain he describes as soreness unchanged from when he initially presented with appendicitis s/p appendectomy on 9/12.   CT scan of the abdomen revealed 7.7 x 6.6 x 6.4 rim-enhancing abscess in the right lower quadrant.   Continue IV antibiotics.  General surgery consulted. CT-guided drain placement recommended. Patient was advised to be on 10/7.  Procedure was canceled. Underwent IR guided drain placement 10/8. Drain culture growing E. coli.  Per surgery this is infected hematoma.  2 weeks of antibiotic recommended. Follow-up on sensitivity.  Supratherapeutic INR See mitral valve replacement  Long term (current) use of anticoagulants See mitral valve replacement.  MITRAL VALVE REPLACEMENT, HX OF Mechanical.  On heparin at home INR 2.5-3.5.  Received vitamin K as well as FFP. Receiving IV heparin for now.  Transition to Lovenox warfarin.  If H&H remained stable will gradually reintroduce aspirin and Aggrenox. On aspirin and Aggrenox currently on hold.  PAF (paroxysmal atrial fibrillation) (HCC) Currently rate controlled.  On Coumadin at home.  Currently on IV heparin.  Transition to Lovenox warfarin.  Hypokalemia Improving.  Replaced.  Hyponatremia Likely from poor p.o. intake.  Resolved.  Chronic diastolic heart failure (HCC) Volume status mildly elevated.  Resuming home regimen.  Iron deficiency anemia H&H stable.  Monitor.  Had recent hospitalization in which she had bleeding postop.  Essential hypertension Blood pressure stable.  Continue bisoprolol, enalapril,  Norvasc.  Hyperlipidemia Continue statin.  Gout Continue allopurinol.     Objective Vital signs were reviewed and unremarkable.  Vitals:   01/02/21 0441 01/02/21 0759 01/02/21 1627 01/02/21 2017  BP: (!) 128/52 134/64 (!) 118/55 126/67  Pulse: 78 72 75 73  Resp: 17 18 18 18   Temp: 98.2 F (36.8 C) 98.3 F (36.8 C) (!) 97.5 F (36.4 C) 98.3 F (36.8 C)  TempSrc: Oral Oral Oral Oral  SpO2: 95% 92% 98% 96%  Weight:      Height:       76.5 kg  Exam General: Appear in mild distress, no Rash; Oral Mucosa Clear, moist. no Abnormal Neck Mass Or lumps, Conjunctiva normal  Cardiovascular: S1 and S2 Present, no Murmur, Respiratory: good respiratory effort, Bilateral Air entry present and CTA, no Crackles, no wheezes Abdomen: Bowel Sound present, Soft and no tenderness Extremities: no Pedal edema Neurology: alert and oriented to time, place, and person affect appropriate. no new focal deficit Gait not checked due to patient safety concerns    Labs / Other Information My review of labs, imaging, notes and other tests shows no new significant findings.     Time spent: 35 mins  [facility] 01/02/2021, 8:46 PM

## 2021-01-02 NOTE — Consult Note (Signed)
   [facility] Inpatient Consult   01/02/2021  XENG KUCHER 06-Jun-1933 [id number]  [facility] [[facility]] Patient: Anthony Gould [facility]  Primary Care Provider:  Cassandria Anger, MD [place]    Patient screened for less than 30 days readmission  hospitalization with noted high risk score for unplanned readmission risk to assess for potential Embedded Chronic Care Management service needs for post hospital transition.    Plan:  Continue to follow progress and disposition to assess for post hospital care management needs.    For questions contact:   Natividad Brood, RN BSN [facility] Liaison  [phone] business mobile phone Toll free office [phone]  Fax number: [phone] [email or web address]

## 2021-01-02 NOTE — Progress Notes (Signed)
Progress Note     Subjective: No complaints of pain and tolerating diet well. No nausea, emesis, abdominal pain. Passing flatus.  Objective: Vital signs in last 24 hours: Temp:  [97.8 F (36.6 C)-98.3 F (36.8 C)] 98.3 F (36.8 C) (10/10 0759) Pulse Rate:  [72-82] 72 (10/10 0759) Resp:  [17-20] 18 (10/10 0759) BP: (114-135)/(52-64) 134/64 (10/10 0759) SpO2:  [92 %-95 %] 92 % (10/10 0759) Last BM Date: 12/31/20  Intake/Output from previous day: 10/09 0701 - 10/10 0700 In: 300 [P.O.:300] Out: 170 [Urine:150; Drains:20] Intake/Output this shift: No intake/output data recorded.  PE: Gen:  Alert, NAD, pleasant Card:  Regular rate and rhythm Pulm:  Normal effort Abd: Soft, non distended, ecchymosis around umbilicus, mild RLQ pain without guarding directly over site of drain. Drain with sanguinous output and some blood on bandage Skin: warm and dry, no rashes  Psych: A&Ox3    Lab Results:  Recent Labs    01/01/21 0146 01/02/21 0049  WBC 5.2 5.9  HGB 9.7* 8.7*  HCT 29.6* 26.5*  PLT 332 300   BMET No results for input(s): NA, K, CL, CO2, GLUCOSE, BUN, CREATININE, CALCIUM in the last 72 hours. PT/INR Recent Labs    12/31/20 0025 01/02/21 0049  LABPROT 19.6* 20.9*  INR 1.7* 1.8*   CMP     Component Value Date/Time   NA 130 (L) 12/30/2020 0506   K 3.3 (L) 12/30/2020 0506   CL 98 12/30/2020 0506   CO2 25 12/30/2020 0506   GLUCOSE 97 12/30/2020 0506   BUN 11 12/30/2020 0506   CREATININE 1.04 12/30/2020 0506   CREATININE 1.40 (H) 04/01/2015 1540   CALCIUM 8.2 (L) 12/30/2020 0506   PROT 6.0 (L) 12/29/2020 1520   ALBUMIN 2.7 (L) 12/29/2020 1520   AST 50 (H) 12/29/2020 1520   ALT 31 12/29/2020 1520   ALKPHOS 107 12/29/2020 1520   BILITOT 1.0 12/29/2020 1520   GFRNONAA >60 12/30/2020 0506   GFRAA 107 11/17/2007 1231   Lipase     Component Value Date/Time   LIPASE 8.0 (L) 12/28/2020 0840       Studies/Results: CT IMAGE GUIDED DRAINAGE BY  PERCUTANEOUS CATHETER  Result Date: 12/31/2020 INDICATION: 87 year old male with a history of prior laparoscopic appendectomy, with right lower quadrant fluid collection EXAM: CT GUIDED DRAINAGE OF  ABSCESS MEDICATIONS: The patient is currently admitted to the hospital and receiving intravenous antibiotics. The antibiotics were administered within an appropriate time frame prior to the initiation of the procedure. ANESTHESIA/SEDATION: 2.0 mg IV Versed 100 mcg IV Fentanyl Moderate Sedation Time:  15 minutes The patient was continuously monitored during the procedure by the interventional radiology nurse under my direct supervision. COMPLICATIONS: None TECHNIQUE: Informed written consent was obtained from the patient after a thorough discussion of the procedural risks, benefits and alternatives. All questions were addressed. Maximal Sterile Barrier Technique was utilized including caps, mask, sterile gowns, sterile gloves, sterile drape, hand hygiene and skin antiseptic. A timeout was performed prior to the initiation of the procedure. PROCEDURE: The right lower quadrant was prepped with chlorhexidine in a sterile fashion, and a sterile drape was applied covering the operative field. A sterile gown and sterile gloves were used for the procedure. Local anesthesia was provided with 1% Lidocaine. Once the patient is prepped and draped and local anesthesia was applied, a Yueh needle was advanced with CT guidance into the fluid collection of the right lower quadrant. With aspiration hematoma, the catheter was advanced. Modified Seldinger technique was used  to place a 12 [place] drain into the fluid collection. Aspiration of hematoma was performed with a sample sent for analysis. Catheter was sutured in position and attached to a bulb suction device. Patient tolerated the procedure well and remained hemodynamically stable throughout. No complications were encountered and no significant blood loss FINDINGS: The initial CT  demonstrates rounded fluid collection with high density internal material in the right lower quadrant adjacent to the cecum. Images after the case demonstrate 12 French drain within the collection. IMPRESSION: Status post CT-guided drainage of hematoma/fluid collection of the right lower quadrant. Sample was sent for analysis. Signed, Dulcy Fanny. Dellia Nims, RPVI [facility] Radiology Electronically Signed   By: Corrie Mckusick D.O.   On: 12/31/2020 12:58    Anti-infectives: Anti-infectives (From admission, onward)    Start     Dose/Rate Route Frequency Ordered Stop   12/29/20 1600  piperacillin-tazobactam (ZOSYN) IVPB 3.375 g        3.375 g 12.5 mL/hr over 240 Minutes Intravenous Every 8 hours 12/29/20 1519     12/29/20 1515  piperacillin-tazobactam (ZOSYN) IVPB 3.375 g  Status:  Discontinued        3.375 g 100 mL/hr over 30 Minutes Intravenous  Once 12/29/20 1511 12/29/20 1519        Assessment/Plan  Status post laparoscopic appendectomy for acute appendicitis - 12/05/20 Dr. Barry Dienes - AFVSS, WBC normal - IR drain in place - consistent with hematoma - small output since placement - no acute surgical needs  - continue abx - regular diet   ID - zosyn 10/6>> FEN - heart healthy diet, Ensure VTE - per medicine   Mechanical mitral valve replacement on coumadin at home Permanent A Fib HTN CAD CKD IIIa  CHF Cirrhosis    Stable for discharge from surgical and diet standpoint. We will sign off but please do not hesitate to contact us with any further needs. Follow up to be arranged with IR drain clinic.  Will arrange follow up with [facility] Dr. Barry Dienes in office 2 weeks.     LOS: 4 days    [facility] 01/02/2021, 9:29 AM Please see Amion for pager number during day hours 7:00am-4:30pm

## 2021-01-02 NOTE — Evaluation (Signed)
Physical Therapy Evaluation Patient Details Name: Anthony Gould MRN: [id number] DOB: [birth year] Today's Date: 01/02/2021  History of Present Illness  Patient presents 10/6 with complaints of continued right lower quadrant abdominal pain he describes as soreness unchanged from when he initially presented with appendicitis s/p appendectomy on 9/12 (was in hospital 9/12-9/25).  Found to have right UQ abcess and JP drain in place.  Supratherapeutic INR. PMH:   pAF on warfarin, mechanical mitral valve, CKD IIIa, pHTN, HTN, and TIA  Clinical Impression  Pt admitted with above diagnosis. Pt was able to ambulate without device but needed min assist for safety as he is unsteady at times. Wife aware and issued gait belt and discussed use of RW if needed initially at home. Pt and wife are eager to get home and are hopeful they will leave today.  Pt should progress well.  Pt currently with functional limitations due to the deficits listed below (see PT Problem List). Pt will benefit from skilled PT to increase their independence and safety with mobility to allow discharge to the venue listed below.          Recommendations for follow up therapy are one component of a multi-disciplinary discharge planning process, led by the attending physician.  Recommendations may be updated based on patient status, additional functional criteria and insurance authorization.  Follow Up Recommendations No PT follow up    Equipment Recommendations  None recommended by PT    Recommendations for Other Services       Precautions / Restrictions Precautions Precautions: Fall Precaution Comments: JP drain Restrictions Weight Bearing Restrictions: No      Mobility  Bed Mobility   Bed Mobility: Supine to Sit     Supine to sit: Supervision     General bed mobility comments: No assist needed    Transfers Overall transfer level: Needs assistance Equipment used: None Transfers: Sit to/from Stand Sit to  Stand: Supervision            Ambulation/Gait Ambulation/Gait assistance: Min guard Gait Distance (Feet): 250 Feet Assistive device: None Gait Pattern/deviations: Step-through pattern;Decreased stride length;Antalgic Gait velocity: decreased Gait velocity interpretation: <1.31 ft/sec, indicative of household ambulator General Gait Details: Pt states he hs been ambulating with wife in halls. Wife present. Pt unsteady at times with pt veering to right more than left. Pt did not need physical assist for balance but occasional guarding necessary.  Pt reports his knees just "give out" at times. discussed using cane or RW at home since pt has them and pt was hesitant.  discussed with pt and wife and she assured PT that she will be with him all the time at home.  Issued gait belt for pt safety.  Stairs         General stair comments: Declined to practtice steps  Wheelchair Mobility    Modified Rankin (Stroke Patients Only)       Balance Overall balance assessment: Needs assistance Sitting-balance support: No upper extremity supported;Feet supported Sitting balance-Leahy Scale: Normal     Standing balance support: No upper extremity supported;During functional activity Standing balance-Leahy Scale: Fair Standing balance comment: can stand statically without UE support.  Pt needs UE support at times dynamically with challenges.                             Pertinent Vitals/Pain Pain Assessment: 0-10 Pain Score: 0-No pain    Home Living Family/patient expects to be discharged to::  Private residence Living Arrangements: Spouse/significant other Available Help at Discharge: Family;Available 24 hours/day Type of Home: House Home Access: Level entry     Home Layout: Two level;Bed/bath upstairs Home Equipment: Cane - single point;Walker - 2 wheels;Shower seat      Prior Function Level of Independence: Independent               Hand Dominance   Dominant  Hand: Right    Extremity/Trunk Assessment   Upper Extremity Assessment Upper Extremity Assessment: Defer to OT evaluation    Lower Extremity Assessment Lower Extremity Assessment: Generalized weakness    Cervical / Trunk Assessment Cervical / Trunk Assessment: Normal  Communication   Communication: HOH  Cognition Arousal/Alertness: Awake/alert Behavior During Therapy: WFL for tasks assessed/performed Overall Cognitive Status: Within Functional Limits for tasks assessed                                        General Comments General comments (skin integrity, edema, etc.): VSS    Exercises     Assessment/Plan    PT Assessment Patient needs continued PT services  PT Problem List Decreased activity tolerance;Decreased balance;Decreased mobility;Decreased knowledge of use of DME;Decreased safety awareness;Decreased knowledge of precautions       PT Treatment Interventions DME instruction;Gait training;Stair training;Functional mobility training;Therapeutic activities;Therapeutic exercise;Patient/family education    PT Goals (Current goals can be found in the Care Plan section)  Acute Rehab PT Goals Patient Stated Goal: go home PT Goal Formulation: With patient Time For Goal Achievement: 01/16/21 Potential to Achieve Goals: Good    Frequency Min 3X/week   Barriers to discharge        Co-evaluation               AM-PAC PT "6 Clicks" Mobility  Outcome Measure Help needed turning from your back to your side while in a flat bed without using bedrails?: None Help needed moving from lying on your back to sitting on the side of a flat bed without using bedrails?: None Help needed moving to and from a bed to a chair (including a wheelchair)?: None Help needed standing up from a chair using your arms (e.g., wheelchair or bedside chair)?: A Little Help needed to walk in hospital room?: A Little Help needed climbing 3-5 steps with a railing? : A  Little 6 Click Score: 21    End of Session Equipment Utilized During Treatment: Gait belt Activity Tolerance: Patient tolerated treatment well Patient left: in chair;with call bell/phone within reach;with family/visitor present;with chair alarm set Nurse Communication: Mobility status PT Visit Diagnosis: Unsteadiness on feet (R26.81)    Time: 1140-1158 PT Time Calculation (min) (ACUTE ONLY): 18 min   Charges:   PT Evaluation $PT Eval Moderate Complexity: 1 Mod          Kalia Vahey M,PT Acute Rehab Services [phone] (pager)   Alvira Philips 01/02/2021, 1:36 PM

## 2021-01-03 ENCOUNTER — Other Ambulatory Visit (HOSPITAL_COMMUNITY): Payer: Self-pay

## 2021-01-03 LAB — PROTIME-INR
INR: 1.9 — ABNORMAL HIGH (ref 0.8–1.2)
Prothrombin Time: 21.4 seconds — ABNORMAL HIGH (ref 11.4–15.2)

## 2021-01-03 LAB — CBC
HCT: 26.3 % — ABNORMAL LOW (ref 39.0–52.0)
Hemoglobin: 8.5 g/dL — ABNORMAL LOW (ref 13.0–17.0)
MCH: 31.5 pg (ref 26.0–34.0)
MCHC: 32.3 g/dL (ref 30.0–36.0)
MCV: 97.4 fL (ref 80.0–100.0)
Platelets: 267 10*3/uL (ref 150–400)
RBC: 2.7 MIL/uL — ABNORMAL LOW (ref 4.22–5.81)
RDW: 15.8 % — ABNORMAL HIGH (ref 11.5–15.5)
WBC: 5.3 10*3/uL (ref 4.0–10.5)
nRBC: 0 % (ref 0.0–0.2)

## 2021-01-03 MED ORDER — ENOXAPARIN SODIUM 80 MG/0.8ML IJ SOSY
80.0000 mg | PREFILLED_SYRINGE | Freq: Two times a day (BID) | INTRAMUSCULAR | 0 refills | Status: DC
  Filled 2021-01-03: qty 8, 5d supply, fill #0

## 2021-01-03 MED ORDER — CEFADROXIL 500 MG PO CAPS
1000.0000 mg | ORAL_CAPSULE | Freq: Two times a day (BID) | ORAL | Status: DC
  Filled 2021-01-03: qty 2

## 2021-01-03 MED ORDER — CEFADROXIL 500 MG PO CAPS
500.0000 mg | ORAL_CAPSULE | Freq: Two times a day (BID) | ORAL | 0 refills | Status: DC
  Filled 2021-01-03: qty 20, 10d supply, fill #0

## 2021-01-03 MED ORDER — CEFADROXIL 500 MG PO CAPS
1000.0000 mg | ORAL_CAPSULE | Freq: Two times a day (BID) | ORAL | 0 refills | Status: AC
  Filled 2021-01-03: qty 40, 10d supply, fill #0

## 2021-01-03 MED ORDER — SODIUM CHLORIDE 0.9% FLUSH
5.0000 mL | Freq: Three times a day (TID) | INTRAVENOUS | Status: DC
  Administered 2021-01-03: 5 mL

## 2021-01-03 MED ORDER — ENSURE ENLIVE PO LIQD
237.0000 mL | Freq: Two times a day (BID) | ORAL | 0 refills | Status: DC
  Filled 2021-01-03: qty 5000, 11d supply, fill #0

## 2021-01-03 MED ORDER — SACCHAROMYCES BOULARDII 250 MG PO CAPS
250.0000 mg | ORAL_CAPSULE | Freq: Two times a day (BID) | ORAL | Status: DC
  Administered 2021-01-03: 250 mg via ORAL
  Filled 2021-01-03: qty 1

## 2021-01-03 MED ORDER — WARFARIN SODIUM 5 MG PO TABS: 5.0000 mg | ORAL_TABLET | Freq: Once | ORAL | Status: DC

## 2021-01-03 MED ORDER — SACCHAROMYCES BOULARDII 250 MG PO CAPS
250.0000 mg | ORAL_CAPSULE | Freq: Two times a day (BID) | ORAL | 0 refills | Status: AC
  Filled 2021-01-03: qty 28, 14d supply, fill #0

## 2021-01-03 NOTE — Progress Notes (Signed)
Patient and wife given jp drain instructions. Patient and wife given instructions for the use of lovenox at home. Patient Ivs removed. Supplies given to patient. Patient discharged

## 2021-01-03 NOTE — Progress Notes (Signed)
Mobility Specialist Progress Note:   01/03/21 1136  Mobility  Activity Ambulated in hall  Level of Assistance Contact guard assist, steadying assist  Assistive Device None  Distance Ambulated (ft) 330 ft  Mobility Ambulated with assistance in hallway  Mobility Response Tolerated well  Mobility performed by Mobility specialist  Bed Position Chair  $Mobility charge 1 Mobility   Pt received in bed and willing to participate in mobility. No complaints of pain and asymptomatic before ambulation. Asymptomatic throughout ambulation. Pt held IV pole partially during ambulation. When turning head pt had increased sway but self corrected. Returned to chair with call bell in reach and all needs met.    [facility] Health and safety inspector Phone [phone]

## 2021-01-03 NOTE — Progress Notes (Signed)
ANTICOAGULATION CONSULT NOTE - Follow Up Consult  Pharmacy Consult for enoxaparin and Warfarin  Indication:  MVR , afib, TIA   Allergies  Allergen Reactions   Diltiazem Other (See Comments)    "Bradycardia," per pt; tolerates Amlodipine   Sulfa Antibiotics Rash and Other (See Comments)    Reaction not fully recalled    Sulfonamide Derivatives Rash and Other (See Comments)    Reaction not fully recalled     Patient Measurements: Height: 5\' 10"  (177.8 cm) Weight: 76.5 kg (168 lb 10.4 oz) IBW/kg (Calculated) : 73 Heparin Dosing Weight:  76.5 kg  Vital Signs: Temp: 97.9 F (36.6 C) (10/11 0806) Temp Source: Oral (10/11 0806) BP: 119/65 (10/11 0806) Pulse Rate: 73 (10/11 0806)  Labs: Recent Labs    01/01/21 0146 01/01/21 1432 01/02/21 0049 01/03/21 0134  HGB 9.7*  --  8.7* 8.5*  HCT 29.6*  --  26.5* 26.3*  PLT 332  --  300 267  LABPROT  --   --  20.9* 21.4*  INR  --   --  1.8* 1.9*  HEPARINUNFRC <0.10* 0.47 0.41  --      Estimated Creatinine Clearance: 53.6 mL/min (by C-G formula based on SCr of 1.04 mg/dL).   Assessment: 87 yo M on warfarin PTA For mechanical MVR, Afib and TIA. INR on admission was 4.3; was just seen in Clinic on 10/5 with INR 4.4 and instructed to hold dose on 10/6. Warfarin held for procedures. Pharmacy consulted for heparin.     INR is 1.9. H/H, plt stable. Eating 75% of meals. Patient has given himself enoxaparin before for bridging.   PTA warfarin 2.5 mg every Mon; 5 mg all other days (has 5mg  tablets). INR goal per last anti-coag note 3-3.5   Goal of Therapy:  Heparin level 0.3-0.7 units/ml INR 3 - 3.5 per outpatient [facility] notes (last seen 10/5) Monitor platelets by anticoagulation protocol: Yes   Plan:  Enoxaparin 80 mg Q12 hr until INR is 2.5 or greater  Warfarin 5mg  x1  Daily INR and CBC  Monitor for signs and sx of bleeding  If discharged, suggest resume home dose warfarin 2.5 mg every Mon; 5 mg all other days and start enoxaparin  80mg  Q12 hr with INR check by Friday 10/14  Benetta Spar, PharmD, BCPS, [facility] Clinical Pharmacist  Please check AMION for all [place] phone numbers After 10:00 PM, call [place] [phone]

## 2021-01-04 ENCOUNTER — Ambulatory Visit: Payer: Medicare Other | Admitting: Internal Medicine

## 2021-01-04 NOTE — Discharge Summary (Addendum)
Physician Discharge Summary   Patient name: Anthony Gould  Admit date:     12/29/2020  Discharge date: 01/03/2021   Attending Physician: Norval Morton [[id number]]  Discharge Physician: Berle Mull   PCP: Cassandria Anger, MD    Follow-up Information     Stark Klein, MD. Go on 01/17/2021.   Specialty: General Surgery Why: at 2 PM for post-operative follow up with a surgeon. please arrive 20-30 minutes early. Contact information: [address] [place] [address] [phone]         Plotnikov, Evie Lacks, MD. Schedule an appointment as soon as possible for a visit in 1 week(s).   Specialty: Internal Medicine Contact information: [place] [address] [phone]         [facility]. Go on 01/06/2021.   Specialty: Cardiology Why: INR check up and lovenox dozing. Contact information: [address] [place] [id number]                Recommendations at discharge: follow up as recommended   Discharge Diagnoses Principal Problem:   Postoperative infected hematoma due to E. coli Active Problems:   PAF (paroxysmal atrial fibrillation) (HCC)   MITRAL VALVE REPLACEMENT, HX OF   Long term (current) use of anticoagulants   Supratherapeutic INR   Chronic diastolic heart failure (HCC)   Hyponatremia   Hypokalemia   Hyperlipidemia   Essential hypertension   Iron deficiency anemia   Gout   Resolved Diagnoses Resolved Problems:   * No resolved hospital problems. [facility]   No notes on file   * Postoperative infected hematoma due to E. coli Patient presents with complaints of continued right lower quadrant abdominal pain he describes as soreness unchanged from when he initially presented with appendicitis s/p appendectomy on 9/12.   CT scan of the abdomen revealed 7.7 x 6.6 x 6.4 rim-enhancing abscess in the right lower quadrant.    Continue IV antibiotics.  General surgery consulted. CT-guided drain placement recommended. Patient was advised to be on 10/7.  Procedure was canceled. Underwent IR guided drain placement 10/8. Drain culture growing E. coli.  Per surgery this is infected hematoma.  2 weeks of antibiotic recommended. Based on culture changed to cefadroxil.   Supratherapeutic INR See mitral valve replacement  Long term (current) use of anticoagulants See mitral valve replacement.  MITRAL VALVE REPLACEMENT, HX OF Mechanical.  On heparin at home INR 2.5-3.5.  Received vitamin K as well as FFP. Transition to Lovenox warfarin.  reintroduce aspirin and Aggrenox.  PAF (paroxysmal atrial fibrillation) (HCC) Currently rate controlled.  On Coumadin at home.  Currently Lovenox warfarin.  Hypokalemia Improving.  Replaced.  Hyponatremia Likely from poor p.o. intake.  Resolved.  Chronic diastolic heart failure (HCC) Volume status mildly elevated.  Resuming home regimen.  Iron deficiency anemia H&H stable.  Monitor.  Had recent hospitalization in which she had bleeding postop.  Essential hypertension Blood pressure stable.  Continue bisoprolol, enalapril, Norvasc.  Hyperlipidemia Continue statin.  Gout Continue allopurinol.     Procedures performed: Ir guided drain placement   Condition at discharge: good  Exam General: Appear in mild distress, no Rash; Oral Mucosa Clear, moist. no Abnormal Neck Mass Or lumps, Conjunctiva normal  Cardiovascular: S1 and S2 Present, no Murmur, Respiratory: good respiratory effort, Bilateral Air entry present and CTA, no Crackles, no wheezes Abdomen: Bowel Sound present, Soft and no tenderness Extremities:  no Pedal edema Neurology: alert and oriented to time, place, and person affect appropriate. no new focal deficit Gait not checked due to patient safety concerns     Disposition: Home  Discharge time: greater than 30 minutes. Allergies as of 01/03/2021        Reactions   Diltiazem Other (See Comments)   "Bradycardia," per pt; tolerates Amlodipine   Sulfa Antibiotics Rash, Other (See Comments)   Reaction not fully recalled    Sulfonamide Derivatives Rash, Other (See Comments)   Reaction not fully recalled         Medication List     STOP taking these medications    magnesium oxide 400 MG tablet Commonly known as: MAG-OX       TAKE these medications    acetaminophen 500 MG tablet Commonly known as: TYLENOL Take 500 mg by mouth every 6 (six) hours as needed for mild pain or headache.   allopurinol 300 MG tablet Commonly known as: ZYLOPRIM TAKE 1 TABLET BY MOUTH DAILY AS NEEDED. GENERIC EQUIVALENT FOR ZYLOPRIM What changed:  how much to take how to take this when to take this additional instructions   amLODipine 5 MG tablet Commonly known as: NORVASC TAKE 1/2 TABLET BY MOUTH EVERY DAY FOR 3 DAYS,IF BLOOD PRESSURE IS STILL HIGHER THAN 130 THEN INCREASE TO 1 TABLET BY MOUTH DAILY What changed: See the new instructions.   aspirin 81 MG EC tablet Take 81 mg by mouth daily.   atorvastatin 20 MG tablet Commonly known as: LIPITOR TAKE 1 TABLET BY MOUTH TWO TIMES A WEEK What changed:  how much to take how to take this when to take this additional instructions   bisoprolol 5 MG tablet Commonly known as: ZEBETA Take 0.5 tablets (2.5 mg total) by mouth daily. What changed: when to take this   cefadroxil 500 MG capsule Commonly known as: DURICEF Take 2 capsules (1,000 mg total) by mouth 2 (two) times daily for 10 days.   dipyridamole 50 MG tablet Commonly known as: PERSANTINE Take 1 tablet (50 mg total) by mouth 3 (three) times daily.   enalapril 20 MG tablet Commonly known as: VASOTEC TAKE 1 TABLET BY MOUTH DAILY. What changed: when to take this   enoxaparin 80 MG/0.8ML injection Commonly known as: LOVENOX Inject 1 syringe (80 mg total) into the skin every 12 (twelve) hours for 5 days.   feeding  supplement Liqd Take 237 mLs by mouth 2 (two) times daily between meals.   ferrous [id number] C-folic acid capsule Commonly known as: TRINSICON / FOLTRIN Take 1 capsule by mouth 2 (two) times daily after a meal.   Florastor 250 MG capsule Generic drug: saccharomyces boulardii Take 1 capsule (250 mg total) by mouth 2 (two) times daily for 14 days.   furosemide 40 MG tablet Commonly known as: LASIX TAKE 1 TABLET BY MOUTH DAILY GENERIC EQUIVALENT FOR LASIX What changed: See the new instructions.   latanoprost 0.005 % ophthalmic solution Commonly known as: XALATAN Place 1 drop into both eyes at bedtime.   Milk Thistle 1000 MG Caps Take 1,000 mg by mouth daily.   pantoprazole 40 MG tablet Commonly known as: PROTONIX TAKE 1 TABLET BY MOUTH DAILY What changed: when to take this   Vitamin D3 50 MCG (2000 UT) capsule Take 1 capsule (2,000 Units total) by mouth daily.   warfarin 5 MG tablet Commonly known as: COUMADIN Take as directed. If you are unsure how to take this medication, talk to your nurse or  doctor. Original instructions: TAKE 1 TABLET BY MOUTH AS DIRECTED BY THE COUMADIN CLINIC               Discharge Care Instructions  (From admission, onward)           Start     Ordered   01/03/21 0000  Leave dressing on - Keep it clean, dry, and intact until clinic visit        01/03/21 1246            CT Abdomen Pelvis W Contrast  Result Date: 12/29/2020 CLINICAL DATA:  History of appendectomy 12/05/2020. Abdominal pain and fever. EXAM: CT ABDOMEN AND PELVIS WITH CONTRAST TECHNIQUE: Multidetector CT imaging of the abdomen and pelvis was performed using the standard protocol following bolus administration of intravenous contrast. CONTRAST:  181mL ISOVUE-300 IOPAMIDOL (ISOVUE-300) INJECTION 61% COMPARISON:  CT scan 12/02/2020 FINDINGS: Lower chest: Borderline cardiac enlargement but no pericardial effusion. Stable aortic calcifications. Prosthetic mitral  valve is noted. Small bilateral pleural effusions with overlying atelectasis. Hepatobiliary: Stable advanced cirrhotic changes involving liver but no hepatic lesions are identified. No intrahepatic biliary dilatation. The gallbladder is surgically absent. No common bile duct dilatation. Pancreas: No mass, inflammation or ductal dilatation. Spleen: Normal size.  No focal lesions. Adrenals/Urinary Tract: Adrenal glands and kidneys are unremarkable stable. No worrisome renal lesions or hydronephrosis. The bladder is decompressed. No significant abnormality. Stomach/Bowel: The stomach, duodenum, small bowel and colon are unremarkable. No acute inflammatory process, mass lesions or obstructive findings. Vascular/Lymphatic: Stable atherosclerotic calcifications involving the aorta and iliac arteries but no aneurysm dissection. Branch vessels are patent. Major venous structures are patent. Reproductive: The prostate gland and seminal vesicles are unremarkable. Other: There is a rim enhancing abscess in the right lower quadrant measuring approximately 7.7 x 6.6 x 6.4 cm. This is just inferior to the cecum and adjacent to the terminal ileum. Some surrounding inflammatory phlegmon. There is also a small amount non rim enhancing fluid in the pelvis but no discrete pelvic abscess. Musculoskeletal: No significant bony findings. Chronic calcific iliopsoas tendinopathy on the right side. IMPRESSION: 1. 7.7 x 6.6 x 6.4 cm rim enhancing abscess in the right lower quadrant. 2. Stable advanced cirrhotic changes involving liver but no hepatic lesions. 3. Small bilateral pleural effusions with overlying atelectasis. 4. Status post cholecystectomy. No biliary dilatation. These results will be called to the ordering clinician or representative by the Radiologist Assistant, and communication documented in the PACS or [employer]. Aortic Atherosclerosis (ICD10-I70.0). Electronically Signed   By: Marijo Sanes M.D.   On: 12/29/2020  09:06   DG CHEST PORT 1 VIEW  Result Date: 12/29/2020 CLINICAL DATA:  History of recent appendectomy with known abscess EXAM: PORTABLE CHEST 1 VIEW COMPARISON:  11/14/2015 FINDINGS: Cardiac shadow is enlarged. Postsurgical changes are noted with valve replacement. The lungs are clear bilaterally. No bony abnormality is noted. IMPRESSION: No active disease. Electronically Signed   By: Inez Catalina M.D.   On: 12/29/2020 22:49   CT IMAGE GUIDED DRAINAGE BY PERCUTANEOUS CATHETER  Result Date: 12/31/2020 INDICATION: 87 year old male with a history of prior laparoscopic appendectomy, with right lower quadrant fluid collection EXAM: CT GUIDED DRAINAGE OF  ABSCESS MEDICATIONS: The patient is currently admitted to the hospital and receiving intravenous antibiotics. The antibiotics were administered within an appropriate time frame prior to the initiation of the procedure. ANESTHESIA/SEDATION: 2.0 mg IV Versed 100 mcg IV Fentanyl Moderate Sedation Time:  15 minutes The patient was continuously monitored during the procedure by  the interventional radiology nurse under my direct supervision. COMPLICATIONS: None TECHNIQUE: Informed written consent was obtained from the patient after a thorough discussion of the procedural risks, benefits and alternatives. All questions were addressed. Maximal Sterile Barrier Technique was utilized including caps, mask, sterile gowns, sterile gloves, sterile drape, hand hygiene and skin antiseptic. A timeout was performed prior to the initiation of the procedure. PROCEDURE: The right lower quadrant was prepped with chlorhexidine in a sterile fashion, and a sterile drape was applied covering the operative field. A sterile gown and sterile gloves were used for the procedure. Local anesthesia was provided with 1% Lidocaine. Once the patient is prepped and draped and local anesthesia was applied, a Yueh needle was advanced with CT guidance into the fluid collection of the right lower  quadrant. With aspiration hematoma, the catheter was advanced. Modified Seldinger technique was used to place a 12 [place] drain into the fluid collection. Aspiration of hematoma was performed with a sample sent for analysis. Catheter was sutured in position and attached to a bulb suction device. Patient tolerated the procedure well and remained hemodynamically stable throughout. No complications were encountered and no significant blood loss FINDINGS: The initial CT demonstrates rounded fluid collection with high density internal material in the right lower quadrant adjacent to the cecum. Images after the case demonstrate 12 French drain within the collection. IMPRESSION: Status post CT-guided drainage of hematoma/fluid collection of the right lower quadrant. Sample was sent for analysis. Signed, Dulcy Fanny. Dellia Nims, RPVI [facility] Electronically Signed   By: Corrie Mckusick D.O.   On: 12/31/2020 12:58   Results for orders placed or performed during the hospital encounter of 12/29/20  Resp Panel by RT-PCR (Flu A&B, Covid) Nasopharyngeal Swab     Status: None   Collection Time: 12/29/20  2:50 PM   Specimen: Nasopharyngeal Swab; Nasopharyngeal(NP) swabs in vial transport medium  Result Value Ref Range Status   SARS Coronavirus 2 by RT PCR NEGATIVE NEGATIVE Final    Comment: (NOTE) SARS-CoV-2 target nucleic acids are NOT DETECTED.  The SARS-CoV-2 RNA is generally detectable in upper respiratory specimens during the acute phase of infection. The lowest concentration of SARS-CoV-2 viral copies this assay can detect is 138 copies/mL. A negative result does not preclude SARS-Cov-2 infection and should not be used as the sole basis for treatment or other patient management decisions. A negative result may occur with  improper specimen collection/handling, submission of specimen other than nasopharyngeal swab, presence of viral mutation(s) within  the areas targeted by this assay, and inadequate number of viral copies(<138 copies/mL). A negative result must be combined with clinical observations, patient history, and epidemiological information. The expected result is Negative.  Fact Sheet for Patients:  EntrepreneurPulse.com.au  Fact Sheet for Healthcare Providers:  IncredibleEmployment.be  This test is no t yet approved or cleared by the [place] FDA and  has been authorized for detection and/or diagnosis of SARS-CoV-2 by FDA under an Emergency Use Authorization (EUA). This EUA will remain  in effect (meaning this test can be used) for the duration of the COVID-19 declaration under Section 564(b)(1) of the Act, 21 U.S.C.section 360bbb-3(b)(1), unless the authorization is terminated  or revoked sooner.       Influenza A by PCR NEGATIVE NEGATIVE Final   Influenza B by PCR NEGATIVE NEGATIVE Final    Comment: (NOTE) The Xpert Xpress SARS-CoV-2/FLU/RSV plus assay is intended as an aid in the diagnosis of influenza from Nasopharyngeal swab specimens  and should not be used as a sole basis for treatment. Nasal washings and aspirates are unacceptable for Xpert Xpress SARS-CoV-2/FLU/RSV testing.  Fact Sheet for Patients: EntrepreneurPulse.com.au  Fact Sheet for Healthcare Providers: IncredibleEmployment.be  This test is not yet approved or cleared by the [place] FDA and has been authorized for detection and/or diagnosis of SARS-CoV-2 by FDA under an Emergency Use Authorization (EUA). This EUA will remain in effect (meaning this test can be used) for the duration of the COVID-19 declaration under Section 564(b)(1) of the Act, 21 U.S.C. section 360bbb-3(b)(1), unless the authorization is terminated or revoked.  Performed at [facility], [place] [address], [place], [place] [address]   Aerobic/Anaerobic Culture w Gram Stain (surgical/deep  wound)     Status: None (Preliminary result)   Collection Time: 12/31/20 10:19 AM   Specimen: Abscess  Result Value Ref Range Status   Specimen Description ABSCESS  Final   Special Requests ABDOMEN  Final   Gram Stain   Final    NO SQUAMOUS EPITHELIAL CELLS SEEN ABUNDANT WBC SEEN NO ORGANISMS SEEN Performed at [facility], [place] [address] [place],  [address]    Culture   Final    ABUNDANT ESCHERICHIA COLI NO ANAEROBES ISOLATED; CULTURE IN PROGRESS FOR 5 DAYS    Report Status PENDING  Incomplete   Organism ID, Bacteria ESCHERICHIA COLI  Final      Susceptibility   Escherichia coli - MIC*    AMPICILLIN >=32 RESISTANT Resistant     CEFAZOLIN <=4 SENSITIVE Sensitive     CEFEPIME <=0.12 SENSITIVE Sensitive     CEFTAZIDIME <=1 SENSITIVE Sensitive     CEFTRIAXONE <=0.25 SENSITIVE Sensitive     CIPROFLOXACIN <=0.25 SENSITIVE Sensitive     GENTAMICIN <=1 SENSITIVE Sensitive     IMIPENEM <=0.25 SENSITIVE Sensitive     TRIMETH/SULFA <=20 SENSITIVE Sensitive     AMPICILLIN/SULBACTAM >=32 RESISTANT Resistant     PIP/TAZO <=4 SENSITIVE Sensitive     * ABUNDANT ESCHERICHIA COLI    Signed:  Berle Mull MD.  [facility] 01/04/2021, 6:27 AM

## 2021-01-05 ENCOUNTER — Telehealth: Payer: Self-pay

## 2021-01-05 LAB — AEROBIC/ANAEROBIC CULTURE W GRAM STAIN (SURGICAL/DEEP WOUND): Gram Stain: NONE SEEN

## 2021-01-05 NOTE — Telephone Encounter (Signed)
Transition Care Management Unsuccessful Follow-up Telephone Call  Date of discharge and from where:  01/03/2021  [facility]  Attempts:  1st Attempt  Reason for unsuccessful TCM follow-up call:  No answer/busy  Tomasa Rand, RN, BSN, CEN [place] Coordinator [phone]

## 2021-01-06 ENCOUNTER — Other Ambulatory Visit: Payer: Self-pay

## 2021-01-06 ENCOUNTER — Telehealth: Payer: Self-pay

## 2021-01-06 ENCOUNTER — Telehealth: Payer: Self-pay | Admitting: Physician Assistant

## 2021-01-06 ENCOUNTER — Telehealth: Payer: Self-pay | Admitting: Internal Medicine

## 2021-01-06 ENCOUNTER — Ambulatory Visit: Payer: Medicare Other | Admitting: *Deleted

## 2021-01-06 DIAGNOSIS — K651 Peritoneal abscess: Secondary | ICD-10-CM

## 2021-01-06 DIAGNOSIS — Z9889 Other specified postprocedural states: Secondary | ICD-10-CM

## 2021-01-06 DIAGNOSIS — I480 Paroxysmal atrial fibrillation: Secondary | ICD-10-CM

## 2021-01-06 DIAGNOSIS — I6350 Cerebral infarction due to unspecified occlusion or stenosis of unspecified cerebral artery: Secondary | ICD-10-CM | POA: Diagnosis not present

## 2021-01-06 DIAGNOSIS — Z7901 Long term (current) use of anticoagulants: Secondary | ICD-10-CM

## 2021-01-06 DIAGNOSIS — I4891 Unspecified atrial fibrillation: Secondary | ICD-10-CM

## 2021-01-06 DIAGNOSIS — I059 Rheumatic mitral valve disease, unspecified: Secondary | ICD-10-CM

## 2021-01-06 DIAGNOSIS — G459 Transient cerebral ischemic attack, unspecified: Secondary | ICD-10-CM

## 2021-01-06 LAB — POCT INR: INR: 3 (ref 2.0–3.0)

## 2021-01-06 NOTE — Patient Instructions (Addendum)
Description   Stop Lovenox. Tomorrow take 1/2 tablet then start taking 1 tablet daily except 1/2 tablet on Monday and Friday. Recheck INR on Monday. [facility]in [facility] [phone]

## 2021-01-06 NOTE — Telephone Encounter (Signed)
MD is out of the office. Pls advise.Marland KitchenJohny Chess

## 2021-01-06 NOTE — Telephone Encounter (Signed)
Pt. Has called and states that he was sent home with a drain in his side. The drain has began to leak, and he is wanting advice as to what he should do. He is aware of his appt.  coming up next Tuesday. States that he can be reached on his home phone until around 1:45pm today [phone]. If he is not available, please call cell [phone]. Would like call ASAP.

## 2021-01-06 NOTE — Telephone Encounter (Signed)
Transition Care Management Follow-up Telephone Call Date of discharge and from where: 01/03/21 from [facility] How have you been since you were released from the hospital? "Ok" but reports drain leaking. Any questions or concerns? Yes "Reports drain is leaking". Encouraged to contact provider at interventional radiology at [facility] and/or Surgeon.    Items Reviewed: Did the pt receive and understand the discharge instructions provided? Yes  Medications obtained and verified? Yes  Other? No  Any new allergies since your discharge? No  Dietary orders reviewed? Yes Do you have support at home? Yes   Home Care and Equipment/Supplies: Were home health services ordered? no If so, what is the name of the agency? Not applicable  Has the agency set up a time to come to the patient's home? not applicable Were any new equipment or medical supplies ordered?  No What is the name of the medical supply agency? Not applicable Were you able to get the supplies/equipment? not applicable Do you have any questions related to the use of the equipment or supplies? No  Functional Questionnaire: (I = Independent and D = Dependent) ADLs: I  Bathing/Dressing- I  Meal Prep- I  Eating- I  Maintaining continence- I  Transferring/Ambulation- I  Managing Meds- I  Follow up appointments reviewed:  PCP Hospital f/u appt confirmed? Yes  Scheduled to see Dr. Alain Marion on 01/10/21 @ 1:20 pm. [facility] f/u appt confirmed? Yes  Scheduled to see Dr. Barry Dienes on 01/17/21 @ 2:00 pm; [name] on 01/25/21 at 9:45 am.. Are transportation arrangements needed? No  If their condition worsens, is the pt aware to call PCP or go to the Emergency Dept.? Yes Was the patient provided with contact information for the PCP's office or ED? Yes Was to pt encouraged to call back with questions or concerns? Yes    Thea Silversmith, RN, MSN, BSN, [place] Care Management Coordinator [phone]

## 2021-01-06 NOTE — Progress Notes (Signed)
Received call from INR clinic RN who states patient requested she call us regarding leaking drain.  Returned call to patient who states that he has had no issues flushing the drain but when he reconnects the drain to the suction bulb tubing fluid enters the clear tubing but does not enter the bulb itself which causes the tubing to back up and leak from the insertion site. He is pinching the bulb and closing the stopper however no fluid returns into the bulb. He denies any abdominal pain, n/v, fever or chills.   Patient instructed to try milking and flushing the suction bulb tubing, if this does not resolve the issue then he will see [place] in IR on Monday 10/17 at his convenience between the hours of 8a-3p. If this resolves the issues he will call to let us know to cancel his appointment.  ED return precautions reviewed, patient understands and is agreeable to plan.  Candiss Norse, [name]

## 2021-01-06 NOTE — Telephone Encounter (Signed)
Notified pt w/MD response. Pt states he have tried to contact the surgeon office, and was given another number to call,but he has been unsuccessful. He states he can not get in contact w/ no one. He states he is at the hematologist now, and he will just have to wait until someone from the other office call back, or just wait until he see Dr. Alain Marion.Marland KitchenJohny Chess

## 2021-01-06 NOTE — Telephone Encounter (Signed)
I would recommend he call his surgeon's office [place] [facility] as they can give better advice on his drain and would be able to adjust this if needed.

## 2021-01-09 ENCOUNTER — Ambulatory Visit: Payer: Medicare Other | Admitting: Internal Medicine

## 2021-01-09 ENCOUNTER — Other Ambulatory Visit: Payer: Self-pay | Admitting: General Surgery

## 2021-01-09 ENCOUNTER — Ambulatory Visit (HOSPITAL_COMMUNITY)
Admission: RE | Admit: 2021-01-09 | Discharge: 2021-01-09 | Disposition: A | Discharge status: Home / Self Care | Payer: Medicare Other | Source: Ambulatory Visit | Attending: Physician Assistant | Admitting: Physician Assistant

## 2021-01-09 ENCOUNTER — Other Ambulatory Visit: Payer: Self-pay

## 2021-01-09 ENCOUNTER — Ambulatory Visit: Payer: Medicare Other

## 2021-01-09 DIAGNOSIS — E1122 Type 2 diabetes mellitus with diabetic chronic kidney disease: Secondary | ICD-10-CM | POA: Insufficient documentation

## 2021-01-09 DIAGNOSIS — K651 Peritoneal abscess: Secondary | ICD-10-CM | POA: Insufficient documentation

## 2021-01-09 DIAGNOSIS — Z9889 Other specified postprocedural states: Secondary | ICD-10-CM | POA: Diagnosis not present

## 2021-01-09 DIAGNOSIS — I480 Paroxysmal atrial fibrillation: Secondary | ICD-10-CM | POA: Diagnosis not present

## 2021-01-09 DIAGNOSIS — T85638A Leakage of other specified internal prosthetic devices, implants and grafts, initial encounter: Secondary | ICD-10-CM | POA: Diagnosis not present

## 2021-01-09 DIAGNOSIS — Z79899 Other long term (current) drug therapy: Secondary | ICD-10-CM | POA: Insufficient documentation

## 2021-01-09 DIAGNOSIS — N183 Chronic kidney disease, stage 3 unspecified: Secondary | ICD-10-CM | POA: Diagnosis not present

## 2021-01-09 DIAGNOSIS — G459 Transient cerebral ischemic attack, unspecified: Secondary | ICD-10-CM

## 2021-01-09 DIAGNOSIS — I059 Rheumatic mitral valve disease, unspecified: Secondary | ICD-10-CM

## 2021-01-09 DIAGNOSIS — I6350 Cerebral infarction due to unspecified occlusion or stenosis of unspecified cerebral artery: Secondary | ICD-10-CM

## 2021-01-09 DIAGNOSIS — Z7901 Long term (current) use of anticoagulants: Secondary | ICD-10-CM | POA: Diagnosis not present

## 2021-01-09 DIAGNOSIS — Z882 Allergy status to sulfonamides status: Secondary | ICD-10-CM | POA: Insufficient documentation

## 2021-01-09 DIAGNOSIS — Z7982 Long term (current) use of aspirin: Secondary | ICD-10-CM | POA: Diagnosis not present

## 2021-01-09 DIAGNOSIS — I4891 Unspecified atrial fibrillation: Secondary | ICD-10-CM | POA: Insufficient documentation

## 2021-01-09 DIAGNOSIS — Z888 Allergy status to other drugs, medicaments and biological substances status: Secondary | ICD-10-CM | POA: Insufficient documentation

## 2021-01-09 HISTORY — PX: IR PATIENT EVAL TECH 0-60 MINS: IMG5564

## 2021-01-09 LAB — POCT INR: INR: 3.1 — AB (ref 2.0–3.0)

## 2021-01-09 NOTE — Progress Notes (Signed)
Chief Complaint: Leaking drain  Supervising Physician: Corrie Mckusick  History of Present Illness: Anthony Gould is a 87 y.o. male with past medical history significant for a.fib on coumadin, mitral valve replacement, CKD III, DM and post appendectomy intra-abdominal fluid collection s/p RLQ drain placement 12/31/20 in IR who presents today for drain evaluation due to leaking.  [name] reports leaking from his drain insertion site for several days and notes that the tubing connected to the suction bulb will fill with liquid but this liquid will not enter the bulb and when this happens his drain leaks at the skin soaking his clothing. They have not had any problems with flushing and he denies any abdominal pain, n/v, fevers or chills.  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   NSVT (nonsustained ventricular tachycardia)    Peripheral edema    venous insufficiency   Permanent atrial fibrillation (HCC)    Pulmonary hypertension (HCC)    Syncope    TIA (transient ischemic attack)    while on coumadin   Type II or unspecified type diabetes mellitus without mention of complication, not stated as uncontrolled    lifestyle mangement   Ventricular ectopy    symptomatic    Past Surgical History:  Procedure Laterality Date   CHOLECYSTECTOMY, LAPAROSCOPIC  2003   LAPAROSCOPIC APPENDECTOMY N/A 12/05/2020   Procedure: APPENDECTOMY LAPAROSCOPIC;  Surgeon: Stark Klein, MD;  Location: [facility] OR;  Service: General;  Laterality: N/A;   MITRAL VALVE REPLACEMENT     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 Authorizing Provider  acetaminophen (TYLENOL) 500 MG tablet Take 500 mg by mouth every 6 (six) hours as needed for mild pain or headache.    [provider]  allopurinol (ZYLOPRIM) 300 MG tablet TAKE 1 TABLET BY MOUTH DAILY AS NEEDED. GENERIC EQUIVALENT FOR ZYLOPRIM Patient taking differently: Take 300 mg by mouth daily. 04/14/20   Plotnikov, Evie Lacks, MD  amLODipine (NORVASC) 5 MG tablet TAKE 1/2 TABLET BY MOUTH EVERY DAY FOR 3 DAYS,IF BLOOD PRESSURE IS STILL HIGHER THAN 130 THEN INCREASE TO 1 TABLET BY MOUTH DAILY Patient taking differently: Take 5 mg by mouth in the morning. 03/28/20   Dunn, Nedra Hai, [name]  aspirin 81 MG EC tablet Take 81 mg by mouth daily.    [provider]  atorvastatin (LIPITOR) 20 MG tablet TAKE 1 TABLET BY MOUTH TWO TIMES A WEEK Patient taking differently: Take 20 mg by mouth See admin instructions. Take 20 mg by mouth at bedtime on Mondays and Fridays 04/14/20   Plotnikov, Evie Lacks, MD  bisoprolol (ZEBETA) 5 MG tablet Take 0.5 tablets (2.5 mg total) by mouth daily. Patient taking differently: Take 2.5 mg by mouth at bedtime. 12/08/19   Dunn, Nedra Hai, [name]  cefadroxil (DURICEF) 500 MG capsule Take 2 capsules (1,000 mg total) by mouth 2 (two) times daily for 10 days. 01/03/21 01/13/21  Lavina Hamman, MD  Cholecalciferol (VITAMIN D3) 50 MCG (2000 UT) capsule Take 1 capsule (2,000 Units total) by mouth daily. 10/01/19   Plotnikov, Evie Lacks, MD  dipyridamole (PERSANTINE) 50 MG tablet Take 1 tablet (50 mg total) by mouth 3 (three) times daily. 08/08/20   Burnell Blanks, MD  enalapril (VASOTEC) 20 MG tablet TAKE 1 TABLET BY MOUTH DAILY. Patient taking differently: Take 20 mg by mouth in the morning. 08/07/20   Plotnikov, Evie Lacks, MD  enoxaparin (LOVENOX) 80 MG/0.8ML injection Inject 1 syringe (80 mg total) into the skin every 12 (twelve) hours for 5 days. 01/04/21 01/09/21  Lavina Hamman, MD  feeding supplement (ENSURE ENLIVE / ENSURE PLUS) LIQD  Take 237 mLs by mouth 2 (two) times daily between meals. 01/03/21   Lavina Hamman, MD  ferrous [id number] C-folic acid (TRINSICON / FOLTRIN) capsule Take 1 capsule by mouth 2 (two) times daily after a meal. Patient not taking: Reported on 12/29/2020 12/18/20   Lorella Nimrod, MD  furosemide (LASIX) 40 MG tablet TAKE 1 TABLET BY MOUTH DAILY GENERIC EQUIVALENT FOR LASIX Patient taking differently: Take 40 mg by mouth in the morning. 07/04/20   Plotnikov, Evie Lacks, MD  latanoprost (XALATAN) 0.005 % ophthalmic solution Place 1 drop into both eyes at bedtime.    [provider]  Milk Thistle 1000 MG CAPS Take 1,000 mg by mouth daily.    [provider]  pantoprazole (PROTONIX) 40 MG tablet TAKE 1 TABLET BY MOUTH DAILY Patient taking differently: Take 40 mg by mouth daily before breakfast. 08/07/20   Plotnikov, Evie Lacks, MD  saccharomyces boulardii (FLORASTOR) 250 MG capsule Take 1 capsule (250 mg total) by mouth 2 (two) times daily for 14 days. 01/03/21 01/17/21  Lavina Hamman, MD  warfarin (COUMADIN) 5 MG tablet TAKE 1 TABLET BY MOUTH AS DIRECTED BY THE COUMADIN CLINIC Patient not taking: No sig reported 06/27/20   Burnell Blanks, MD     Family History  Problem Relation Age of Onset   Coronary artery disease Father    Diabetes Other    Coronary artery disease Other    Prostate cancer Neg Hx    Colon cancer Neg Hx     Social History   Socioeconomic History   Marital status: Married    Spouse name: Anthony Gould   Number of children: 2   Years of education: Not on file   Highest education level: Not on file  Occupational History   Occupation: Programme researcher, 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  Tobacco Use   Smoking status: Never   Smokeless tobacco: Never  Substance and Sexual Activity   Alcohol use: No    Alcohol/week: 0.0 standard drinks   Drug use: No   Sexual activity: Not on file  Other Topics Concern   Not on file  Social History Narrative   HSG, many  management courses Married '58, 2 sons - '61, '62   Social Determinants of Health   Emergency planning/management officer Strain: Not on Art therapist Insecurity: Not on file  Transportation Needs: Not on file  Physical Activity: Not on file  Stress: Not on file  Social Connections: Not on file    Review of Systems: A 12 point ROS discussed and pertinent positives are indicated in the HPI above.  All other systems are negative.  Review of Systems  Constitutional:  Negative for chills and fever.  Respiratory:  Negative for shortness of breath.   Gastrointestinal:  Negative for abdominal pain, nausea and vomiting.  Musculoskeletal:  Negative for back pain.   Vital Signs: There were no vitals taken for this visit.  Physical Exam Constitutional:  General: He is not in acute distress. HENT:     Head: Normocephalic.  Pulmonary:     Effort: Pulmonary effort is normal.  Abdominal:     Palpations: Abdomen is soft.     Comments: (+) RLQ drain to suction with scant bloody output in suction bulb, bloody fluid in drain tubing. Dried blood noted around insertion site and surrounding approximately 2 inches of drain catheter - this was removed easily with normal saline and no active bleeding was noted. Drain flushed/aspirated with 10 cc NS without issue, return of bloody fluid. Drain returned to original suction bulb without flow noted into the bulb. The bulb was exchanged for a new device, the drain was again flushed with 10 cc NS and the flush contents were noted to enter the suction bulb. No leakage of flush material from drain site. New stat lock placed, retention suture remained in tact.  Skin:    General: Skin is warm and dry.     Findings: Bruising (abdominal surgical sites) present.  Neurological:     Mental Status: He is alert. Mental status is at baseline.     Imaging: CT Abdomen Pelvis W Contrast  Result Date: 12/29/2020 CLINICAL DATA:  History of appendectomy 12/05/2020. Abdominal pain and  fever. EXAM: CT ABDOMEN AND PELVIS WITH CONTRAST TECHNIQUE: Multidetector CT imaging of the abdomen and pelvis was performed using the standard protocol following bolus administration of intravenous contrast. CONTRAST:  164mL ISOVUE-300 IOPAMIDOL (ISOVUE-300) INJECTION 61% COMPARISON:  CT scan 12/02/2020 FINDINGS: Lower chest: Borderline cardiac enlargement but no pericardial effusion. Stable aortic calcifications. Prosthetic mitral valve is noted. Small bilateral pleural effusions with overlying atelectasis. Hepatobiliary: Stable advanced cirrhotic changes involving liver but no hepatic lesions are identified. No intrahepatic biliary dilatation. The gallbladder is surgically absent. No common bile duct dilatation. Pancreas: No mass, inflammation or ductal dilatation. Spleen: Normal size.  No focal lesions. Adrenals/Urinary Tract: Adrenal glands and kidneys are unremarkable stable. No worrisome renal lesions or hydronephrosis. The bladder is decompressed. No significant abnormality. Stomach/Bowel: The stomach, duodenum, small bowel and colon are unremarkable. No acute inflammatory process, mass lesions or obstructive findings. Vascular/Lymphatic: Stable atherosclerotic calcifications involving the aorta and iliac arteries but no aneurysm dissection. Branch vessels are patent. Major venous structures are patent. Reproductive: The prostate gland and seminal vesicles are unremarkable. Other: There is a rim enhancing abscess in the right lower quadrant measuring approximately 7.7 x 6.6 x 6.4 cm. This is just inferior to the cecum and adjacent to the terminal ileum. Some surrounding inflammatory phlegmon. There is also a small amount non rim enhancing fluid in the pelvis but no discrete pelvic abscess. Musculoskeletal: No significant bony findings. Chronic calcific iliopsoas tendinopathy on the right side. IMPRESSION: 1. 7.7 x 6.6 x 6.4 cm rim enhancing abscess in the right lower quadrant. 2. Stable advanced cirrhotic  changes involving liver but no hepatic lesions. 3. Small bilateral pleural effusions with overlying atelectasis. 4. Status post cholecystectomy. No biliary dilatation. These results will be called to the ordering clinician or representative by the Radiologist Assistant, and communication documented in the PACS or [employer]. Aortic Atherosclerosis (ICD10-I70.0). Electronically Signed   By: Marijo Sanes M.D.   On: 12/29/2020 09:06   DG CHEST PORT 1 VIEW  Result Date: 12/29/2020 CLINICAL DATA:  History of recent appendectomy with known abscess EXAM: PORTABLE CHEST 1 VIEW COMPARISON:  11/14/2015 FINDINGS: Cardiac shadow is enlarged. Postsurgical changes are noted with valve replacement. The lungs are clear bilaterally. No bony abnormality is noted.  IMPRESSION: No active disease. Electronically Signed   By: Inez Catalina M.D.   On: 12/29/2020 22:49   CT IMAGE GUIDED DRAINAGE BY PERCUTANEOUS CATHETER  Result Date: 12/31/2020 INDICATION: 87 year old male with a history of prior laparoscopic appendectomy, with right lower quadrant fluid collection EXAM: CT GUIDED DRAINAGE OF  ABSCESS MEDICATIONS: The patient is currently admitted to the hospital and receiving intravenous antibiotics. The antibiotics were administered within an appropriate time frame prior to the initiation of the procedure. ANESTHESIA/SEDATION: 2.0 mg IV Versed 100 mcg IV Fentanyl Moderate Sedation Time:  15 minutes The patient was continuously monitored during the procedure by the interventional radiology nurse under my direct supervision. COMPLICATIONS: None TECHNIQUE: Informed written consent was obtained from the patient after a thorough discussion of the procedural risks, benefits and alternatives. All questions were addressed. Maximal Sterile Barrier Technique was utilized including caps, mask, sterile gowns, sterile gloves, sterile drape, hand hygiene and skin antiseptic. A timeout was performed prior to the initiation of the  procedure. PROCEDURE: The right lower quadrant was prepped with chlorhexidine in a sterile fashion, and a sterile drape was applied covering the operative field. A sterile gown and sterile gloves were used for the procedure. Local anesthesia was provided with 1% Lidocaine. Once the patient is prepped and draped and local anesthesia was applied, a Yueh needle was advanced with CT guidance into the fluid collection of the right lower quadrant. With aspiration hematoma, the catheter was advanced. Modified Seldinger technique was used to place a 12 [place] drain into the fluid collection. Aspiration of hematoma was performed with a sample sent for analysis. Catheter was sutured in position and attached to a bulb suction device. Patient tolerated the procedure well and remained hemodynamically stable throughout. No complications were encountered and no significant blood loss FINDINGS: The initial CT demonstrates rounded fluid collection with high density internal material in the right lower quadrant adjacent to the cecum. Images after the case demonstrate 12 French drain within the collection. IMPRESSION: Status post CT-guided drainage of hematoma/fluid collection of the right lower quadrant. Sample was sent for analysis. Signed, Dulcy Fanny. Dellia Nims, RPVI [facility] Electronically Signed   By: Corrie Mckusick D.O.   On: 12/31/2020 12:58    Labs:  CBC: Recent Labs    12/31/20 0025 01/01/21 0146 01/02/21 0049 01/03/21 0134  WBC 4.4 5.2 5.9 5.3  HGB 8.3* 9.7* 8.7* 8.5*  HCT 25.5* 29.6* 26.5* 26.3*  PLT 313 332 300 267    COAGS: Recent Labs    12/04/20 1652 12/05/20 0857 12/31/20 0025 01/02/21 0049 01/03/21 0134 01/06/21 1424  INR  --    < > 1.7* 1.8* 1.9* 3.0  APTT 55*  --   --   --   --   --    < > = values in this interval not displayed.    BMP: Recent Labs    12/17/20 0351 12/18/20 0245 12/28/20 0840 12/29/20 1520 12/30/20 0506   NA 132* 133* 130* 125* 130*  K 3.7 3.7 3.7 3.5 3.3*  CL 100 100 93* 92* 98  CO2 25 25 27 23 25   GLUCOSE 93 104* 108* 144* 97  BUN 7* 8 15 16 11   CALCIUM 8.1* 8.2* 8.8 8.2* 8.2*  CREATININE 0.96 0.93 1.08 1.14 1.04  GFRNONAA >60 >60  --  >60 >60    LIVER FUNCTION TESTS: Recent Labs    12/03/20 0147 12/07/20 1001 12/28/20 0840 12/29/20 1520  BILITOT 1.9* 1.7* 1.2  1.0  AST 37 31 42* 50*  ALT 27 21 28 31   ALKPHOS 78 61 106 107  PROT 6.6 5.6* 6.5 6.0*  ALBUMIN 3.6 2.9* 3.2* 2.7*    TUMOR MARKERS: No results for input(s): AFPTM, CEA, CA199, CHROMGRNA in the last 8760 hours.  Assessment:  87 y/o M with post-appendectomy intra-abdominal abscess s/p RLQ drain placement 12/31/20 who presents to [facility] today for drain evaluation due to leaking.  Per patient leakage from insertion site and fluid noted in drain tubing but does not enter suction bulb - milking and flushing drain tubing over the weekend did not resolve this issue. No leakage of flush material from skin at home.  On exam today output is bloody consistent with previous recorded output, per patient this has been the same at home. Patient on coumadin, last INR 3.0 on 10/14. Drain flushes/aspirates easily without leakage of material, suction bulb tubing appears to have clot in the tubing near the bulb stopping free flow of fluid. New suction bulb placed with easy return of bloody fluid + flush material.   Given above findings likely clot in tubing preventing flow in bulb and thus causing backflow from the insertion site. Patient instructed to continue flushing drain BID and to flush suction bulb tubing QD to maintain patency.   Due to difficulties with accurate output and ongoing leakage from insertion site will plan on seeing patient in IR clinic on 10/27 for CT/possible injection. Patient and wife agreeable to this plan and are aware to call with any further questions or concerns.  Electronically Signed: Joaquim Nam  [name] 01/09/2021, 1:06 PM

## 2021-01-09 NOTE — Patient Instructions (Addendum)
-   continue taking 1 tablet daily except 1/2 tablet on Monday and Friday.  - Recheck INR on Friday Coumadin Clinic [phone]

## 2021-01-10 ENCOUNTER — Encounter: Payer: Self-pay | Admitting: Internal Medicine

## 2021-01-10 ENCOUNTER — Other Ambulatory Visit (HOSPITAL_COMMUNITY): Payer: Self-pay

## 2021-01-10 ENCOUNTER — Ambulatory Visit (INDEPENDENT_AMBULATORY_CARE_PROVIDER_SITE_OTHER): Payer: Medicare Other | Admitting: Internal Medicine

## 2021-01-10 DIAGNOSIS — K3533 Acute appendicitis with perforation and localized peritonitis, with abscess: Secondary | ICD-10-CM

## 2021-01-10 DIAGNOSIS — R109 Unspecified abdominal pain: Secondary | ICD-10-CM | POA: Diagnosis not present

## 2021-01-10 DIAGNOSIS — I480 Paroxysmal atrial fibrillation: Secondary | ICD-10-CM | POA: Diagnosis not present

## 2021-01-10 DIAGNOSIS — Z7901 Long term (current) use of anticoagulants: Secondary | ICD-10-CM | POA: Diagnosis not present

## 2021-01-10 NOTE — Assessment & Plan Note (Addendum)
RLQ abscess w/a drain - f/u w/IR pending. CT scan of the abdomen revealed 7.7 x 6.6 x 6.4 rim-enhancing abscess in the right lower quadrant.   Underwent IR guided drain placement 10/8. Drain culture growing E. coli.  Per surgery this is infected hematoma.  2 weeks of antibiotic recommended. On PO abx - Duricef

## 2021-01-10 NOTE — Assessment & Plan Note (Signed)
On Coumadin 

## 2021-01-10 NOTE — Progress Notes (Signed)
Subjective:  Patient ID: Anthony Gould, male    DOB: [birth year]  Age: 87 y.o. MRN: [id number]  CC: Follow-up (1 week f/u)   HPI Anthony Gould presents for Intra-abd abscess f/u. Feeling some better. Draining less via the drain in the RLQ. On PO abx. Better appetite.... Per surgery this is infected hematoma.    Med records, tests, CT, labs - reviewed  Per hx: " Admit date:     12/29/2020  Discharge date: 01/03/2021   Attending Physician: Norval Morton [[id number]]  Discharge Physician: Berle Mull        Recommendations at discharge: follow up as recommended    Discharge Diagnoses Principal Problem:   Postoperative infected hematoma due to E. coli Active Problems:   PAF (paroxysmal atrial fibrillation) (HCC)   MITRAL VALVE REPLACEMENT, HX OF   Long term (current) use of anticoagulants   Supratherapeutic INR   Chronic diastolic heart failure (HCC)   Hyponatremia   Hypokalemia   Hyperlipidemia   Essential hypertension   Iron deficiency anemia   Gout     Resolved Diagnoses Resolved Problems:   * No resolved hospital problems. [facility]   No notes on file     * Postoperative infected hematoma due to E. coli Patient presents with complaints of continued right lower quadrant abdominal pain he describes as soreness unchanged from when he initially presented with appendicitis s/p appendectomy on 9/12.   CT scan of the abdomen revealed 7.7 x 6.6 x 6.4 rim-enhancing abscess in the right lower quadrant.   Continue IV antibiotics.  General surgery consulted. CT-guided drain placement recommended. Patient was advised to be on 10/7.  Procedure was canceled. Underwent IR guided drain placement 10/8. Drain culture growing E. coli.  Per surgery this is infected hematoma.  2 weeks of antibiotic recommended. Based on culture changed to cefadroxil.    Supratherapeutic INR See mitral valve replacement   Long term (current) use of anticoagulants See  mitral valve replacement.   MITRAL VALVE REPLACEMENT, HX OF Mechanical.  On heparin at home INR 2.5-3.5.  Received vitamin K as well as FFP. Transition to Lovenox warfarin.  reintroduce aspirin and Aggrenox.   PAF (paroxysmal atrial fibrillation) (HCC) Currently rate controlled.  On Coumadin at home.  Currently Lovenox warfarin.   Hypokalemia Improving.  Replaced.   Hyponatremia Likely from poor p.o. intake.  Resolved.   Chronic diastolic heart failure (HCC) Volume status mildly elevated.  Resuming home regimen.   Iron deficiency anemia H&H stable.  Monitor.  Had recent hospitalization in which she had bleeding postop.   Essential hypertension Blood pressure stable.  Continue bisoprolol, enalapril, Norvasc.   Hyperlipidemia Continue statin.   Gout Continue allopurinol.    Outpatient Medications Prior to Visit  Medication Sig Dispense Refill   acetaminophen (TYLENOL) 500 MG tablet Take 500 mg by mouth every 6 (six) hours as needed for mild pain or headache.     allopurinol (ZYLOPRIM) 300 MG tablet TAKE 1 TABLET BY MOUTH DAILY AS NEEDED. GENERIC EQUIVALENT FOR ZYLOPRIM (Patient taking differently: Take 300 mg by mouth daily.) 90 tablet 3   amLODipine (NORVASC) 5 MG tablet TAKE 1/2 TABLET BY MOUTH EVERY DAY FOR 3 DAYS,IF BLOOD PRESSURE IS STILL HIGHER THAN 130 THEN INCREASE TO 1 TABLET BY MOUTH DAILY (Patient taking differently: Take 5 mg by mouth in the morning.) 90 tablet 3   aspirin 81 MG EC tablet Take 81 mg by mouth daily.  atorvastatin (LIPITOR) 20 MG tablet TAKE 1 TABLET BY MOUTH TWO TIMES A WEEK (Patient taking differently: Take 20 mg by mouth See admin instructions. Take 20 mg by mouth at bedtime on Mondays and Fridays) 26 tablet 3   cefadroxil (DURICEF) 500 MG capsule Take 2 capsules (1,000 mg total) by mouth 2 (two) times daily for 10 days. 40 capsule 0   Cholecalciferol (VITAMIN D3) 50 MCG (2000 UT) capsule Take 1 capsule (2,000 Units total) by mouth daily. 100  capsule 3   dipyridamole (PERSANTINE) 50 MG tablet Take 1 tablet (50 mg total) by mouth 3 (three) times daily. 270 tablet 1   enalapril (VASOTEC) 20 MG tablet TAKE 1 TABLET BY MOUTH DAILY. (Patient taking differently: Take 20 mg by mouth in the morning.) 90 tablet 3   feeding supplement (ENSURE ENLIVE / ENSURE PLUS) LIQD Take 237 mLs by mouth 2 (two) times daily between meals. 5000 mL 0   ferrous [id number] C-folic acid (TRINSICON / FOLTRIN) capsule Take 1 capsule by mouth 2 (two) times daily after a meal. 60 capsule 1   furosemide (LASIX) 40 MG tablet TAKE 1 TABLET BY MOUTH DAILY GENERIC EQUIVALENT FOR LASIX (Patient taking differently: Take 40 mg by mouth in the morning.) 90 tablet 3   latanoprost (XALATAN) 0.005 % ophthalmic solution Place 1 drop into both eyes at bedtime.     Milk Thistle 1000 MG CAPS Take 1,000 mg by mouth daily.     pantoprazole (PROTONIX) 40 MG tablet TAKE 1 TABLET BY MOUTH DAILY (Patient taking differently: Take 40 mg by mouth daily before breakfast.) 90 tablet 3   saccharomyces boulardii (FLORASTOR) 250 MG capsule Take 1 capsule (250 mg total) by mouth 2 (two) times daily for 14 days. 28 capsule 0   warfarin (COUMADIN) 5 MG tablet TAKE 1 TABLET BY MOUTH AS DIRECTED BY THE COUMADIN CLINIC 100 tablet 1   bisoprolol (ZEBETA) 5 MG tablet Take 0.5 tablets (2.5 mg total) by mouth daily. (Patient taking differently: Take 2.5 mg by mouth at bedtime.) 45 tablet 3   enoxaparin (LOVENOX) 80 MG/0.8ML injection Inject 1 syringe (80 mg total) into the skin every 12 (twelve) hours for 5 days. 8 mL 0   No facility-administered medications prior to visit.    ROS: Review of Systems  Constitutional:  Positive for fatigue. Negative for appetite change and unexpected weight change.  HENT:  Negative for congestion, nosebleeds, sneezing, sore throat and trouble swallowing.   Eyes:  Negative for itching and visual disturbance.  Respiratory:  Negative for cough.   Cardiovascular:   Negative for chest pain, palpitations and leg swelling.  Gastrointestinal:  Negative for abdominal distention, blood in stool, diarrhea and nausea.  Genitourinary:  Negative for frequency and hematuria.  Musculoskeletal:  Negative for back pain, gait problem, joint swelling and neck pain.  Skin:  Negative for rash.  Neurological:  Positive for weakness. Negative for dizziness, tremors and speech difficulty.  Psychiatric/Behavioral:  Negative for agitation, dysphoric mood, sleep disturbance and suicidal ideas. The patient is not nervous/anxious.    Objective:  BP (!) 120/58 (BP Location: Left Arm)   Pulse 83   Temp 97.7 F (36.5 C) (Oral)   Ht 5\' 10"  (1.778 m)   Wt 175 lb (79.4 kg)   SpO2 97%   BMI 25.11 kg/m   BP Readings from Last 3 Encounters:  01/10/21 (!) 120/58  01/03/21 119/65  12/28/20 128/60    Wt Readings from Last 3 Encounters:  01/10/21 175 lb (79.4  kg)  12/30/20 168 lb 10.4 oz (76.5 kg)  12/28/20 173 lb 12.8 oz (78.8 kg)    Physical Exam Constitutional:      General: He is not in acute distress.    Appearance: He is well-developed.     Comments: NAD  Eyes:     Conjunctiva/sclera: Conjunctivae normal.     Pupils: Pupils are equal, round, and reactive to light.  Neck:     Thyroid: No thyromegaly.     Vascular: No JVD.  Cardiovascular:     Rate and Rhythm: Normal rate and regular rhythm.     Heart sounds: Normal heart sounds. No murmur heard.   No friction rub. No gallop.  Pulmonary:     Effort: Pulmonary effort is normal. No respiratory distress.     Breath sounds: Normal breath sounds. No wheezing or rales.  Chest:     Chest wall: No tenderness.  Abdominal:     General: Bowel sounds are normal. There is no distension.     Palpations: Abdomen is soft. There is no mass.     Tenderness: There is no abdominal tenderness. There is no guarding or rebound.  Musculoskeletal:        General: No tenderness. Normal range of motion.     Cervical back: Normal  range of motion.  Lymphadenopathy:     Cervical: No cervical adenopathy.  Skin:    General: Skin is warm and dry.     Findings: No rash.  Neurological:     Mental Status: He is alert and oriented to person, place, and time.     Cranial Nerves: No cranial nerve deficit.     Motor: No abnormal muscle tone.     Coordination: Coordination normal.     Gait: Gait normal.     Deep Tendon Reflexes: Reflexes are normal and symmetric.  Psychiatric:        Behavior: Behavior normal.        Thought Content: Thought content normal.        Judgment: Judgment normal.   Drain in the RLQ - brown d/c Edema 1+ Lab Results  Component Value Date   WBC 5.3 01/03/2021   HGB 8.5 (L) 01/03/2021   HCT 26.3 (L) 01/03/2021   PLT 267 01/03/2021   GLUCOSE 97 12/30/2020   CHOL 140 04/01/2020   TRIG 96.0 04/01/2020   HDL 58.80 04/01/2020   LDLCALC 62 04/01/2020   ALT 31 12/29/2020   AST 50 (H) 12/29/2020   NA 130 (L) 12/30/2020   K 3.3 (L) 12/30/2020   CL 98 12/30/2020   CREATININE 1.04 12/30/2020   BUN 11 12/30/2020   CO2 25 12/30/2020   TSH 11.11 (H) 07/01/2020   PSA 0.87 10/20/2014   INR 3.3 (A) 01/13/2021   HGBA1C 5.3 12/03/2020    No results found.  Assessment & Plan:   Problem List Items Addressed This Visit     Abdominal pain    RLQ abscess w/a drain - f/u w/IR pending. CT scan of the abdomen revealed 7.7 x 6.6 x 6.4 rim-enhancing abscess in the right lower quadrant.   On PO abx      Appendiceal abscess    RLQ abscess w/a drain - f/u w/IR pending. CT scan of the abdomen revealed 7.7 x 6.6 x 6.4 rim-enhancing abscess in the right lower quadrant.   Underwent IR guided drain placement 10/8. Drain culture growing E. coli.  Per surgery this is infected hematoma.  2 weeks of antibiotic recommended.  On PO abx - Duricef      Long term (current) use of anticoagulants    On Coumadin      PAF (paroxysmal atrial fibrillation) (HCC)    Cont w/Bisoprolol, Diltiazem d/c, ASA, Coumadin,  Norvasc         Follow-up: Return in about 4 weeks (around 02/07/2021) for a follow-up visit.  Walker Kehr, MD

## 2021-01-10 NOTE — Assessment & Plan Note (Signed)
Cont w/Bisoprolol, Diltiazem d/c, ASA, Coumadin, Norvasc

## 2021-01-10 NOTE — Patient Instructions (Signed)
Per surgery this is infected hematoma.

## 2021-01-10 NOTE — Assessment & Plan Note (Signed)
RLQ abscess w/a drain - f/u w/IR pending. CT scan of the abdomen revealed 7.7 x 6.6 x 6.4 rim-enhancing abscess in the right lower quadrant.   On PO abx

## 2021-01-12 ENCOUNTER — Other Ambulatory Visit (HOSPITAL_COMMUNITY): Payer: Self-pay

## 2021-01-12 ENCOUNTER — Other Ambulatory Visit: Payer: Self-pay | Admitting: Physician Assistant

## 2021-01-12 MED ORDER — SODIUM CHLORIDE 0.9 % IJ SOLN
INTRAMUSCULAR | 2 refills | Status: DC
  Filled 2021-01-12: qty 10, fill #0

## 2021-01-12 MED ORDER — SODIUM CHLORIDE 0.9 % IJ SOLN: INTRAMUSCULAR | 2 refills | Status: DC

## 2021-01-12 MED ORDER — NORMAL SALINE FLUSH 0.9 % IV SOLN
INTRAVENOUS | 2 refills | Status: DC
  Filled 2021-01-12: qty 150, 30d supply, fill #0

## 2021-01-13 ENCOUNTER — Other Ambulatory Visit: Payer: Self-pay

## 2021-01-13 ENCOUNTER — Ambulatory Visit: Payer: Medicare Other

## 2021-01-13 ENCOUNTER — Other Ambulatory Visit: Payer: Self-pay | Admitting: Physician Assistant

## 2021-01-13 DIAGNOSIS — Z7901 Long term (current) use of anticoagulants: Secondary | ICD-10-CM | POA: Diagnosis not present

## 2021-01-13 DIAGNOSIS — I4891 Unspecified atrial fibrillation: Secondary | ICD-10-CM

## 2021-01-13 DIAGNOSIS — I059 Rheumatic mitral valve disease, unspecified: Secondary | ICD-10-CM

## 2021-01-13 DIAGNOSIS — I6350 Cerebral infarction due to unspecified occlusion or stenosis of unspecified cerebral artery: Secondary | ICD-10-CM | POA: Diagnosis not present

## 2021-01-13 DIAGNOSIS — Z9889 Other specified postprocedural states: Secondary | ICD-10-CM

## 2021-01-13 LAB — POCT INR: INR: 3.3 — AB (ref 2.0–3.0)

## 2021-01-13 NOTE — Patient Instructions (Signed)
Description   - continue taking 1 tablet daily except 1/2 tablet on Monday and Friday.  - Recheck INR in 1 week  [facility] [phone]

## 2021-01-17 DIAGNOSIS — L089 Local infection of the skin and subcutaneous tissue, unspecified: Secondary | ICD-10-CM | POA: Insufficient documentation

## 2021-01-18 ENCOUNTER — Encounter (HOSPITAL_COMMUNITY): Payer: Self-pay | Admitting: Radiology

## 2021-01-18 ENCOUNTER — Other Ambulatory Visit: Payer: Self-pay | Admitting: Physician Assistant

## 2021-01-18 DIAGNOSIS — K651 Peritoneal abscess: Secondary | ICD-10-CM

## 2021-01-18 NOTE — Procedures (Signed)
Drain was evaluated by PA Candiss Norse. She flushed tube and placed new bandage.

## 2021-01-19 ENCOUNTER — Ambulatory Visit
Admission: RE | Admit: 2021-01-19 | Discharge: 2021-01-19 | Disposition: A | Discharge status: Home / Self Care | Payer: Medicare Other | Source: Ambulatory Visit | Attending: General Surgery | Admitting: General Surgery

## 2021-01-19 ENCOUNTER — Other Ambulatory Visit: Payer: Self-pay | Admitting: General Surgery

## 2021-01-19 ENCOUNTER — Ambulatory Visit
Admission: RE | Admit: 2021-01-19 | Discharge: 2021-01-19 | Disposition: A | Discharge status: Home / Self Care | Payer: Medicare Other | Source: Ambulatory Visit | Attending: Physician Assistant | Admitting: Physician Assistant

## 2021-01-19 ENCOUNTER — Encounter: Payer: Self-pay | Admitting: Radiology

## 2021-01-19 DIAGNOSIS — K8689 Other specified diseases of pancreas: Secondary | ICD-10-CM | POA: Diagnosis not present

## 2021-01-19 DIAGNOSIS — K632 Fistula of intestine: Secondary | ICD-10-CM | POA: Diagnosis not present

## 2021-01-19 DIAGNOSIS — K651 Peritoneal abscess: Secondary | ICD-10-CM

## 2021-01-19 DIAGNOSIS — K7460 Unspecified cirrhosis of liver: Secondary | ICD-10-CM | POA: Diagnosis not present

## 2021-01-19 DIAGNOSIS — L02211 Cutaneous abscess of abdominal wall: Secondary | ICD-10-CM | POA: Diagnosis not present

## 2021-01-19 HISTORY — PX: IR RADIOLOGIST EVAL & MGMT: IMG5224

## 2021-01-19 MED ORDER — IOPAMIDOL (ISOVUE-300) INJECTION 61%
100.0000 mL | Freq: Once | INTRAVENOUS | Status: AC | PRN
  Administered 2021-01-19: 100 mL via INTRAVENOUS

## 2021-01-19 NOTE — Progress Notes (Addendum)
Referring Physician(s): Dr Mamie Laurel  Chief Complaint: The patient is seen in follow up today s/p prior laparoscopic appendectomy, with right lower quadrant fluid collection--12 French drain within the Collection on 12/31/20  History of present illness:  Appendectomy with Dr Barry Dienes 12/05/20 Developed abd pain and fever CT 10/6 revealing: 7.7 x 6.6 x 6.4 cm rim enhancing abscess in the right lower quadrant. Drain placed in IR 12/31/20  CT today showing resolution of hematoma/collection per Dr Laurence Ferrari Pt states he has no fever; no pain Denies N/V Flushing daily OP is minimal to nil daily OP is dark red color  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   NSVT (nonsustained ventricular tachycardia)    Peripheral edema    venous insufficiency   Permanent atrial fibrillation ([place])    Pulmonary hypertension ([place])    Syncope    TIA (transient ischemic attack)    while on coumadin   Type II or unspecified type diabetes mellitus without mention of complication, not stated as uncontrolled    lifestyle mangement   Ventricular ectopy    symptomatic    Past Surgical History:  Procedure Laterality Date   CHOLECYSTECTOMY, LAPAROSCOPIC  2003   IR PATIENT EVAL TECH 0-60 MINS  01/09/2021   LAPAROSCOPIC APPENDECTOMY N/A 12/05/2020   Procedure: APPENDECTOMY LAPAROSCOPIC;  Surgeon: Stark Klein, MD;  Location: [place];  Service: General;  Laterality: N/A;   MITRAL VALVE REPLACEMENT     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    Allergies: Diltiazem, Sulfa antibiotics, and Sulfonamide derivatives  Medications: Prior to Admission medications   Medication Sig Start Date End Date Taking? Authorizing Provider  acetaminophen (TYLENOL) 500 MG tablet Take 500 mg by mouth  every 6 (six) hours as needed for mild pain or headache.    [provider]  allopurinol (ZYLOPRIM) 300 MG tablet TAKE 1 TABLET BY MOUTH DAILY AS NEEDED. GENERIC EQUIVALENT FOR ZYLOPRIM Patient taking differently: Take 300 mg by mouth daily. 04/14/20   Plotnikov, Evie Lacks, MD  amLODipine (NORVASC) 5 MG tablet TAKE 1/2 TABLET BY MOUTH EVERY DAY FOR 3 DAYS,IF BLOOD PRESSURE IS STILL HIGHER THAN 130 THEN INCREASE TO 1 TABLET BY MOUTH DAILY Patient taking differently: Take 5 mg by mouth in the morning. 03/28/20   Dunn, Nedra Hai, [name]  aspirin 81 MG EC tablet Take 81 mg by mouth daily.    [provider]  atorvastatin (LIPITOR) 20 MG tablet TAKE 1 TABLET BY MOUTH TWO TIMES A WEEK Patient taking differently: Take 20 mg by mouth See admin instructions. Take 20 mg by mouth at bedtime on Mondays and Fridays 04/14/20   Plotnikov, Evie Lacks, MD  bisoprolol (ZEBETA) 5 MG tablet Take 1 tablet (5 mg total) by mouth daily. Please keep upcoming appt for future refills. Thank you 01/13/21   Burnell Blanks, MD  Cholecalciferol (VITAMIN D3) 50 MCG (2000 UT) capsule Take 1 capsule (2,000 Units total) by mouth daily. 10/01/19   Plotnikov, Evie Lacks, MD  dipyridamole (PERSANTINE) 50 MG tablet Take 1 tablet (50 mg total) by mouth 3 (three) times daily. 08/08/20   Burnell Blanks, MD  enalapril (VASOTEC) 20 MG tablet TAKE 1 TABLET BY MOUTH DAILY. Patient taking differently: Take 20  mg by mouth in the morning. 08/07/20   Plotnikov, Evie Lacks, MD  feeding supplement (ENSURE ENLIVE / ENSURE PLUS) LIQD Take 237 mLs by mouth 2 (two) times daily between meals. 01/03/21   Lavina Hamman, MD  ferrous [id number] C-folic acid (TRINSICON / FOLTRIN) capsule Take 1 capsule by mouth 2 (two) times daily after a meal. 12/18/20   Lorella Nimrod, MD  furosemide (LASIX) 40 MG tablet TAKE 1 TABLET BY MOUTH DAILY GENERIC EQUIVALENT FOR LASIX Patient taking differently: Take 40 mg by mouth in the morning.  07/04/20   Plotnikov, Evie Lacks, MD  latanoprost (XALATAN) 0.005 % ophthalmic solution Place 1 drop into both eyes at bedtime.    [provider]  Milk Thistle 1000 MG CAPS Take 1,000 mg by mouth daily.    [provider]  pantoprazole (PROTONIX) 40 MG tablet TAKE 1 TABLET BY MOUTH DAILY Patient taking differently: Take 40 mg by mouth daily before breakfast. 08/07/20   Plotnikov, Evie Lacks, MD  Sodium Chloride Flush (NORMAL SALINE FLUSH) 0.9 % SOLN Use 5 MLs to flush the drain daily 01/12/21   Candiss Norse A, [name]  warfarin (COUMADIN) 5 MG tablet TAKE 1 TABLET BY MOUTH AS DIRECTED BY THE COUMADIN CLINIC 06/27/20   Burnell Blanks, MD     Family History  Problem Relation Age of Onset   Coronary artery disease Father    Diabetes Other    Coronary artery disease Other    Prostate cancer Neg Hx    Colon cancer Neg Hx     Social History   Socioeconomic History   Marital status: Married    Spouse name: [name]   Number of children: 2   Years of education: Not on file   Highest education level: Not on file  Occupational History   Occupation: Programme researcher, 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  Tobacco Use   Smoking status: Never   Smokeless tobacco: Never  Substance and Sexual Activity   Alcohol use: No    Alcohol/week: 0.0 standard drinks   Drug use: No   Sexual activity: Not on file  Other Topics Concern   Not on file  Social History Narrative   HSG, many management courses Married '58, 2 sons - '61, '62   Social Determinants of Health   Emergency planning/management officer Strain: Not on Art therapist Insecurity: Not on file  Transportation Needs: Not on file  Physical Activity: Not on file  Stress: Not on file  Social Connections: Not on file     Vital Signs: There were no vitals taken for this visit.  Physical Exam Skin:    General: Skin is warm.     Comments: Site is clean and dry No sign of redness or infection NT no bleeding OP in JP is dark red color-  blood Flushes easily Drain injection does reveal communication to bowel    Imaging: No results found.  Labs:  CBC: Recent Labs    12/31/20 0025 01/01/21 0146 01/02/21 0049 01/03/21 0134  WBC 4.4 5.2 5.9 5.3  HGB 8.3* 9.7* 8.7* 8.5*  HCT 25.5* 29.6* 26.5* 26.3*  PLT 313 332 300 267    COAGS: Recent Labs    12/04/20 1652 12/05/20 0857 01/03/21 0134 01/06/21 1424 01/09/21 1509 01/13/21 1459  INR  --    < > 1.9* 3.0 3.1* 3.3*  APTT 55*  --   --   --   --   --    < > = values  in this interval not displayed.    BMP: Recent Labs    12/17/20 0351 12/18/20 0245 12/28/20 0840 12/29/20 1520 12/30/20 0506  NA 132* 133* 130* 125* 130*  K 3.7 3.7 3.7 3.5 3.3*  CL 100 100 93* 92* 98  CO2 25 25 27 23 25   GLUCOSE 93 104* 108* 144* 97  BUN 7* 8 15 16 11   CALCIUM 8.1* 8.2* 8.8 8.2* 8.2*  CREATININE 0.96 0.93 1.08 1.14 1.04  GFRNONAA >60 >60  --  >60 >60    LIVER FUNCTION TESTS: Recent Labs    12/03/20 0147 12/07/20 1001 12/28/20 0840 12/29/20 1520  BILITOT 1.9* 1.7* 1.2 1.0  AST 37 31 42* 50*  ALT 27 21 28 31   ALKPHOS 78 61 106 107  PROT 6.6 5.6* 6.5 6.0*  ALBUMIN 3.6 2.9* 3.2* 2.7*    Assessment:  Post appendectomy RLQ collection -- IR drain placed 12/31/20 CT today showing resolution of collection-- But drain injection does reveal communication to bowel. Drain must stay for now. Change JP to gravity bag; decrease daily flushes to 2x/week Return to Clinic 2 weeks for injection only per Dr Laurence Ferrari. He and wife have good understanding of plan  Signed: Lavonia Drafts, [name] 01/19/2021, 1:35 PM   Please refer to Dr. Laurence Ferrari attestation of this note for management and plan.

## 2021-01-23 ENCOUNTER — Emergency Department (HOSPITAL_COMMUNITY)
Admission: EM | Admit: 2021-01-23 | Discharge: 2021-01-23 | Disposition: A | Discharge status: Home / Self Care | Payer: Medicare Other | Attending: Emergency Medicine | Admitting: Emergency Medicine

## 2021-01-23 ENCOUNTER — Emergency Department (HOSPITAL_COMMUNITY): Payer: Medicare Other

## 2021-01-23 DIAGNOSIS — I5032 Chronic diastolic (congestive) heart failure: Secondary | ICD-10-CM | POA: Diagnosis not present

## 2021-01-23 DIAGNOSIS — Z20822 Contact with and (suspected) exposure to covid-19: Secondary | ICD-10-CM | POA: Diagnosis not present

## 2021-01-23 DIAGNOSIS — K529 Noninfective gastroenteritis and colitis, unspecified: Secondary | ICD-10-CM | POA: Diagnosis not present

## 2021-01-23 DIAGNOSIS — Z7982 Long term (current) use of aspirin: Secondary | ICD-10-CM | POA: Diagnosis not present

## 2021-01-23 DIAGNOSIS — R1084 Generalized abdominal pain: Secondary | ICD-10-CM | POA: Diagnosis not present

## 2021-01-23 DIAGNOSIS — E1122 Type 2 diabetes mellitus with diabetic chronic kidney disease: Secondary | ICD-10-CM | POA: Insufficient documentation

## 2021-01-23 DIAGNOSIS — N183 Chronic kidney disease, stage 3 unspecified: Secondary | ICD-10-CM | POA: Diagnosis not present

## 2021-01-23 DIAGNOSIS — D72829 Elevated white blood cell count, unspecified: Secondary | ICD-10-CM | POA: Diagnosis not present

## 2021-01-23 DIAGNOSIS — Z85828 Personal history of other malignant neoplasm of skin: Secondary | ICD-10-CM | POA: Insufficient documentation

## 2021-01-23 DIAGNOSIS — Z7902 Long term (current) use of antithrombotics/antiplatelets: Secondary | ICD-10-CM | POA: Diagnosis not present

## 2021-01-23 DIAGNOSIS — I130 Hypertensive heart and chronic kidney disease with heart failure and stage 1 through stage 4 chronic kidney disease, or unspecified chronic kidney disease: Secondary | ICD-10-CM | POA: Insufficient documentation

## 2021-01-23 DIAGNOSIS — Z79899 Other long term (current) drug therapy: Secondary | ICD-10-CM | POA: Diagnosis not present

## 2021-01-23 DIAGNOSIS — I480 Paroxysmal atrial fibrillation: Secondary | ICD-10-CM | POA: Diagnosis not present

## 2021-01-23 DIAGNOSIS — R509 Fever, unspecified: Secondary | ICD-10-CM | POA: Diagnosis present

## 2021-01-23 DIAGNOSIS — R197 Diarrhea, unspecified: Secondary | ICD-10-CM | POA: Diagnosis not present

## 2021-01-23 DIAGNOSIS — R109 Unspecified abdominal pain: Secondary | ICD-10-CM | POA: Diagnosis not present

## 2021-01-23 LAB — CBC WITH DIFFERENTIAL/PLATELET
Abs Immature Granulocytes: 0.07 10*3/uL (ref 0.00–0.07)
Basophils Absolute: 0 10*3/uL (ref 0.0–0.1)
Basophils Relative: 0 %
Eosinophils Absolute: 0 10*3/uL (ref 0.0–0.5)
Eosinophils Relative: 0 %
HCT: 32.6 % — ABNORMAL LOW (ref 39.0–52.0)
Hemoglobin: 10.5 g/dL — ABNORMAL LOW (ref 13.0–17.0)
Immature Granulocytes: 1 %
Lymphocytes Relative: 5 %
Lymphs Abs: 0.7 10*3/uL (ref 0.7–4.0)
MCH: 31.2 pg (ref 26.0–34.0)
MCHC: 32.2 g/dL (ref 30.0–36.0)
MCV: 96.7 fL (ref 80.0–100.0)
Monocytes Absolute: 0.4 10*3/uL (ref 0.1–1.0)
Monocytes Relative: 3 %
Neutro Abs: 11.1 10*3/uL — ABNORMAL HIGH (ref 1.7–7.7)
Neutrophils Relative %: 91 %
Platelets: 272 10*3/uL (ref 150–400)
RBC: 3.37 MIL/uL — ABNORMAL LOW (ref 4.22–5.81)
RDW: 16.2 % — ABNORMAL HIGH (ref 11.5–15.5)
WBC: 12.2 10*3/uL — ABNORMAL HIGH (ref 4.0–10.5)
nRBC: 0 % (ref 0.0–0.2)

## 2021-01-23 LAB — I-STAT CHEM 8, ED
BUN: 12 mg/dL (ref 8–23)
Calcium, Ion: 1.08 mmol/L — ABNORMAL LOW (ref 1.15–1.40)
Chloride: 94 mmol/L — ABNORMAL LOW (ref 98–111)
Creatinine, Ser: 1.2 mg/dL (ref 0.61–1.24)
Glucose, Bld: 110 mg/dL — ABNORMAL HIGH (ref 70–99)
HCT: 35 % — ABNORMAL LOW (ref 39.0–52.0)
Hemoglobin: 11.9 g/dL — ABNORMAL LOW (ref 13.0–17.0)
Potassium: 3.3 mmol/L — ABNORMAL LOW (ref 3.5–5.1)
Sodium: 128 mmol/L — ABNORMAL LOW (ref 135–145)
TCO2: 22 mmol/L (ref 22–32)

## 2021-01-23 LAB — RESP PANEL BY RT-PCR (FLU A&B, COVID) ARPGX2
Influenza A by PCR: NEGATIVE
Influenza B by PCR: NEGATIVE
SARS Coronavirus 2 by RT PCR: NEGATIVE

## 2021-01-23 LAB — HEPATIC FUNCTION PANEL
ALT: 11 U/L (ref 0–44)
AST: 28 U/L (ref 15–41)
Albumin: 3 g/dL — ABNORMAL LOW (ref 3.5–5.0)
Alkaline Phosphatase: 65 U/L (ref 38–126)
Bilirubin, Direct: 0.3 mg/dL — ABNORMAL HIGH (ref 0.0–0.2)
Indirect Bilirubin: 1.1 mg/dL — ABNORMAL HIGH (ref 0.3–0.9)
Total Bilirubin: 1.4 mg/dL — ABNORMAL HIGH (ref 0.3–1.2)
Total Protein: 5.9 g/dL — ABNORMAL LOW (ref 6.5–8.1)

## 2021-01-23 MED ORDER — IOHEXOL 350 MG/ML SOLN
75.0000 mL | Freq: Once | INTRAVENOUS | Status: AC | PRN
  Administered 2021-01-23: 75 mL via INTRAVENOUS

## 2021-01-23 MED ORDER — AMOXICILLIN-POT CLAVULANATE 875-125 MG PO TABS
1.0000 | ORAL_TABLET | Freq: Once | ORAL | Status: AC
  Administered 2021-01-23: 1 via ORAL
  Filled 2021-01-23: qty 1

## 2021-01-23 MED ORDER — AMOXICILLIN-POT CLAVULANATE 875-125 MG PO TABS: 1.0000 | ORAL_TABLET | Freq: Two times a day (BID) | ORAL | 0 refills | Status: DC

## 2021-01-23 NOTE — Progress Notes (Deleted)
Cardiology Office Note    Date:  01/23/2021   ID:  Anthony Gould, DOB [birth year], MRN [id number]  PCP:  Cassandria Anger, MD  Cardiologist:  Lauree Chandler, MD  Electrophysiologist:  None   Chief Complaint: ***  History of Present Illness:   Anthony Gould is a 87 y.o. male with history of ***  Labwork independently reviewed:    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   NSVT (nonsustained ventricular tachycardia)    Peripheral edema    venous insufficiency   Permanent atrial fibrillation ([place])    Pulmonary hypertension ([place])    Syncope    TIA (transient ischemic attack)    while on coumadin   Type II or unspecified type diabetes mellitus without mention of complication, not stated as uncontrolled    lifestyle mangement   Ventricular ectopy    symptomatic    Past Surgical History:  Procedure Laterality Date   CHOLECYSTECTOMY, LAPAROSCOPIC  2003   IR PATIENT EVAL TECH 0-60 MINS  01/09/2021   IR RADIOLOGIST EVAL & MGMT  01/19/2021   LAPAROSCOPIC APPENDECTOMY N/A 12/05/2020   Procedure: APPENDECTOMY LAPAROSCOPIC;  Surgeon: Stark Klein, MD;  Location: [place];  Service: General;  Laterality: N/A;   MITRAL VALVE REPLACEMENT     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    Current Medications: No outpatient medications have been marked as taking for the 01/25/21 encounter (Appointment) with Charlie Pitter, [name].   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  Tobacco Use   Smoking status: Never   Smokeless tobacco: Never  Substance and Sexual Activity   Alcohol use: No    Alcohol/week: 0.0 standard drinks   Drug use: No   Sexual activity: Not on file  Other Topics Concern   Not on file  Social History Narrative   HSG, many management courses Married '58, 2 sons - '61, '62   Social Determinants of Health   Emergency planning/management officer Strain: Not on Art therapist Insecurity: Not on file  Transportation Needs: Not on file  Physical Activity: Not on file  Stress: Not on file  Social Connections: Not on file     Family History:  The patient's ***family history includes Coronary artery disease in his father and another family member; Diabetes in an other family member. There is no history of Prostate cancer or Colon cancer.  ROS:   Please see the history of present illness. Otherwise, review of systems is positive for ***.  All other systems are reviewed and otherwise negative.    EKGs/Labs/Other Studies Reviewed:    Studies reviewed are outlined and summarized above. Reports included below if pertinent.  ***    EKG:  EKG is ordered today, personally reviewed, demonstrating ***  Recent Labs: 07/01/2020: TSH 11.11 12/09/2020: Magnesium 2.0 12/29/2020: ALT 31 12/30/2020: BUN 11; Creatinine, Ser 1.04; Potassium 3.3; Sodium  130 01/03/2021: Hemoglobin 8.5; Platelets 267  Recent Lipid Panel    Component Value Date/Time   CHOL 140 04/01/2020 0808   TRIG 96.0 04/01/2020 0808   HDL 58.80 04/01/2020 0808   CHOLHDL 2 04/01/2020 0808   VLDL 19.2 04/01/2020 0808   LDLCALC 62 04/01/2020 0808    PHYSICAL EXAM:    VS:  There were no vitals taken for this visit.  BMI: There is no height or weight on file to calculate BMI.  GEN: Well nourished, well developed male in no acute distress HEENT: normocephalic, atraumatic Neck: no JVD, carotid bruits, or masses Cardiac: ***RRR; no murmurs, rubs, or gallops, no edema   Respiratory:  clear to auscultation bilaterally, normal work of breathing GI: soft, nontender, nondistended, + BS MS: no deformity or atrophy Skin: warm and dry, no rash Neuro:  Alert and Oriented x 3, Strength and sensation are intact, follows commands Psych: euthymic mood, full affect  Wt Readings from Last 3 Encounters:  01/10/21 175 lb (79.4 kg)  12/30/20 168 lb 10.4 oz (76.5 kg)  12/28/20 173 lb 12.8 oz (78.8 kg)     ASSESSMENT & PLAN:   ***     Disposition: F/u with ***   Medication Adjustments/Labs and Tests Ordered: Current medicines are reviewed at length with the patient today.  Concerns regarding medicines are outlined above. Medication changes, Labs and Tests ordered today are summarized above and listed in the Patient Instructions accessible in Encounters.   Signed, Charlie Pitter, [name]  01/23/2021 11:20 AM    [address], [place], [place]  [address] Phone: [phone]; Fax: [phone]

## 2021-01-23 NOTE — Discharge Instructions (Addendum)
Please follow-up both with your primary care doctor and your general surgery office.  Ideally you should have recheck in the clinic within the next 24 to 48 hours.  Take the antibiotic as prescribed.  If you have persistent or worsening fevers, any abdominal pain, vomiting or other new concerning symptom, please return immediately to ER for reassessment.  Please ask your primary doctor to recheck your coumadin levels (INR) after starting this antibiotic to monitor these levels closely.

## 2021-01-23 NOTE — ED Provider Notes (Signed)
[facility] Provider Note   CSN: [id number] Arrival date & time: 01/23/21  1052     History Chief Complaint  Patient presents with   Diarrhea   Fever    Anthony Gould is a 87 y.o. male.  Presented to ER with concern for fever, diarrhea.  Patient reports that symptoms of diarrhea started over the weekend, multiple loose stools.  The diarrhea seems to have stopped.  No further diarrhea or loose stools today.  Yesterday he had a fever up to 102, this morning noted fever low-grade to 100.  Took dose of Tylenol.  Currently denies any symptoms.  No abdominal pain nausea or vomiting.  No change in output from his right lower quadrant drain.  September patient had appendectomy.  Complicated by postoperative infected hematoma from E. coli requiring pain placement.  Patient had follow-up with general surgery last week, plan to keep drain in place for another 2 weeks.  HPI     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   NSVT (nonsustained ventricular tachycardia)    Peripheral edema    venous insufficiency   Permanent atrial fibrillation (HCC)    Pulmonary hypertension (HCC)    Syncope    TIA (transient ischemic attack)    while on coumadin   Type II or unspecified type diabetes mellitus without mention of complication, not stated as uncontrolled    lifestyle mangement   Ventricular ectopy    symptomatic    Patient Active Problem List   Diagnosis Date Noted   Appendiceal abscess 01/10/2021   Hyponatremia 12/30/2020   Hypokalemia 12/30/2020   Iron deficiency anemia 12/30/2020   Postoperative infected hematoma due to E. coli 12/29/2020   Supratherapeutic INR 12/29/2020   Anemia 12/28/2020   H/O mitral valve replacement with  mechanical valve    Acute appendicitis 12/03/2020   Insomnia 09/27/2020   Grief at loss of child 09/01/2020   Right hip pain 08/10/2020   Elevated TSH 07/07/2020   CRI (chronic renal insufficiency), stage 3 (moderate) ([place]) 04/08/2020   Neoplasm of uncertain behavior of skin 10/01/2019   Hypomagnesemia 04/02/2019   Syncope 03/05/2019   Abdominal pain 01/20/2019   Vitamin D deficiency 11/28/2018   Low back pain 11/26/2018   Skin cancer 11/26/2018   Melanoma in situ ([place]) 08/26/2018   Foot lesion 04/23/2018   Hematuria 12/18/2017   Night sweats 12/18/2017   Chronic diastolic heart failure ([place]) 09/16/2016   Food allergy 02/13/2016   Cirrhosis of liver without ascites ([place]) 10/12/2015   Nausea with vomiting 10/12/2015   Diarrhea 10/03/2015   Emesis 09/20/2015   Aspiration pneumonia ([place]) 09/03/2015   Food poisoning 09/02/2015   Hematemesis 09/02/2015   Cough 09/02/2015   Flank pain, acute 05/02/2015   Actinic keratoses 10/20/2014   NSVT (nonsustained ventricular tachycardia) ([place]) 02/16/2013   Routine health maintenance 02/06/2012   Paroxysmal atrial fibrillation ([place]) 09/06/2011   Long term (current) use of anticoagulants 07/04/2010   Gout 06/27/2010   Mitral valve disorder 06/06/2010   Chest pain 02/28/2010   TOXIC LABYRINTHITIS 08/04/2009   Hearing loss 07/07/2009   DYSPEPSIA, CHRONIC 06/23/2007   DM2 (diabetes mellitus, type 2) ([place]) 02/08/2007   Hyperlipidemia 01/31/2007   Essential  hypertension 01/31/2007   Coronary atherosclerosis 01/31/2007   PAF (paroxysmal atrial fibrillation) ([place]) 01/31/2007   Cerebral artery occlusion with cerebral infarction ([place]) 01/31/2007   Transient cerebral ischemia 01/31/2007   MITRAL VALVE REPLACEMENT, HX OF 01/31/2007    Past Surgical History:  Procedure Laterality Date   CHOLECYSTECTOMY, LAPAROSCOPIC  2003   IR PATIENT EVAL TECH 0-60 MINS  01/09/2021   IR RADIOLOGIST EVAL & MGMT  01/19/2021   LAPAROSCOPIC APPENDECTOMY N/A  12/05/2020   Procedure: APPENDECTOMY LAPAROSCOPIC;  Surgeon: Stark Klein, MD;  Location: [facility];  Service: General;  Laterality: N/A;   MITRAL VALVE REPLACEMENT     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       Family History  Problem Relation Age of Onset   Coronary artery disease Father    Diabetes Other    Coronary artery disease Other    Prostate cancer Neg Hx    Colon cancer Neg Hx     Social History   Tobacco Use   Smoking status: Never   Smokeless tobacco: Never  Substance Use Topics   Alcohol use: No    Alcohol/week: 0.0 standard drinks   Drug use: No    Home Medications Prior to Admission medications   Medication Sig Start Date End Date Taking? Authorizing Provider  amoxicillin-clavulanate (AUGMENTIN) 875-125 MG tablet Take 1 tablet by mouth every 12 (twelve) hours. 01/23/21  Yes Lucrezia Starch, MD  acetaminophen (TYLENOL) 500 MG tablet Take 500 mg by mouth every 6 (six) hours as needed for mild pain or headache.    [provider]  allopurinol (ZYLOPRIM) 300 MG tablet TAKE 1 TABLET BY MOUTH DAILY AS NEEDED. GENERIC EQUIVALENT FOR ZYLOPRIM Patient taking differently: Take 300 mg by mouth daily. 04/14/20   Plotnikov, Evie Lacks, MD  amLODipine (NORVASC) 5 MG tablet TAKE 1/2 TABLET BY MOUTH EVERY DAY FOR 3 DAYS,IF BLOOD PRESSURE IS STILL HIGHER THAN 130 THEN INCREASE TO 1 TABLET BY MOUTH DAILY Patient taking differently: Take 5 mg by mouth in the morning. 03/28/20   Dunn, Nedra Hai, [name]  aspirin 81 MG EC tablet Take 81 mg by mouth daily.    [provider]  atorvastatin (LIPITOR) 20 MG tablet TAKE 1 TABLET BY MOUTH TWO TIMES A WEEK Patient taking differently: Take 20 mg by mouth See admin instructions. Take 20 mg by mouth at bedtime on Mondays and Fridays 04/14/20   Plotnikov, Evie Lacks, MD  bisoprolol (ZEBETA) 5 MG tablet Take 1 tablet (5 mg total) by mouth daily. Please keep upcoming appt for future refills. Thank you 01/13/21   Burnell Blanks, MD  Cholecalciferol (VITAMIN D3) 50 MCG (2000 UT) capsule Take 1 capsule (2,000 Units total) by mouth daily. 10/01/19   Plotnikov, Evie Lacks, MD  dipyridamole (PERSANTINE) 50 MG tablet Take 1 tablet (50 mg total) by mouth 3 (three) times daily. 08/08/20   Burnell Blanks, MD  enalapril (VASOTEC) 20 MG tablet TAKE 1 TABLET BY MOUTH DAILY. Patient taking differently: Take 20 mg by mouth in the morning. 08/07/20   Plotnikov, Evie Lacks, MD  feeding supplement (ENSURE ENLIVE / ENSURE PLUS) LIQD Take 237 mLs by mouth 2 (two) times daily between meals. 01/03/21   Lavina Hamman, MD  ferrous [id number] C-folic acid (TRINSICON / FOLTRIN) capsule Take 1 capsule by mouth 2 (two) times daily after a meal. 12/18/20   Lorella Nimrod, MD  furosemide (LASIX) 40 MG tablet TAKE 1 TABLET BY MOUTH DAILY GENERIC EQUIVALENT FOR  LASIX Patient taking differently: Take 40 mg by mouth in the morning. 07/04/20   Plotnikov, Evie Lacks, MD  latanoprost (XALATAN) 0.005 % ophthalmic solution Place 1 drop into both eyes at bedtime.    [provider]  Milk Thistle 1000 MG CAPS Take 1,000 mg by mouth daily.    [provider]  pantoprazole (PROTONIX) 40 MG tablet TAKE 1 TABLET BY MOUTH DAILY Patient taking differently: Take 40 mg by mouth daily before breakfast. 08/07/20   Plotnikov, Evie Lacks, MD  Sodium Chloride Flush (NORMAL SALINE FLUSH) 0.9 % SOLN Use 5 MLs to flush the drain daily 01/12/21   Candiss Norse A, [name]  warfarin (COUMADIN) 5 MG tablet TAKE 1 TABLET BY MOUTH AS DIRECTED BY THE COUMADIN CLINIC 06/27/20   Burnell Blanks, MD    Allergies    Diltiazem, Sulfa antibiotics, and Sulfonamide derivatives  Review of Systems   Review of Systems  Constitutional:  Positive for fever. Negative for chills.  HENT:  Negative for ear pain and sore throat.   Eyes:  Negative for pain and visual disturbance.  Respiratory:  Negative for cough and shortness of breath.    Cardiovascular:  Negative for chest pain and palpitations.  Gastrointestinal:  Positive for diarrhea. Negative for abdominal pain and vomiting.  Genitourinary:  Negative for dysuria and hematuria.  Musculoskeletal:  Negative for arthralgias and back pain.  Skin:  Negative for color change and rash.  Neurological:  Negative for seizures and syncope.  All other systems reviewed and are negative.  Physical Exam Updated Vital Signs BP 138/60   Pulse 89   Temp 98.8 F (37.1 C) (Oral)   Resp (!) 22   SpO2 98%   Physical Exam Vitals and nursing note reviewed.  Constitutional:      Appearance: He is well-developed.  HENT:     Head: Normocephalic and atraumatic.  Eyes:     Conjunctiva/sclera: Conjunctivae normal.  Cardiovascular:     Rate and Rhythm: Normal rate and regular rhythm.     Heart sounds: No murmur heard. Pulmonary:     Effort: Pulmonary effort is normal. No respiratory distress.     Breath sounds: Normal breath sounds.  Abdominal:     Palpations: Abdomen is soft.     Tenderness: There is no abdominal tenderness.     Comments: No tenderness throughout abdomen, right lower quadrant drain is intact, no surrounding erythema or induration around drain insertion site  Musculoskeletal:     Cervical back: Neck supple.  Skin:    General: Skin is warm and dry.  Neurological:     General: No focal deficit present.     Mental Status: He is alert.  Psychiatric:        Mood and Affect: Mood normal.        Behavior: Behavior normal.    ED Results / Procedures / Treatments   Labs (all labs ordered are listed, but only abnormal results are displayed) Labs Reviewed  CBC WITH DIFFERENTIAL/PLATELET - Abnormal; Notable for the following components:      Result Value   WBC 12.2 (*)    RBC 3.37 (*)    Hemoglobin 10.5 (*)    HCT 32.6 (*)    RDW 16.2 (*)    Neutro Abs 11.1 (*)    All other components within normal limits  HEPATIC FUNCTION PANEL - Abnormal; Notable for the  following components:   Total Protein 5.9 (*)    Albumin 3.0 (*)  Total Bilirubin 1.4 (*)    Bilirubin, Direct 0.3 (*)    Indirect Bilirubin 1.1 (*)    All other components within normal limits  I-STAT CHEM 8, ED - Abnormal; Notable for the following components:   Sodium 128 (*)    Potassium 3.3 (*)    Chloride 94 (*)    Glucose, Bld 110 (*)    Calcium, Ion 1.08 (*)    Hemoglobin 11.9 (*)    HCT 35.0 (*)    All other components within normal limits  RESP PANEL BY RT-PCR (FLU A&B, COVID) ARPGX2  CULTURE, BLOOD (ROUTINE X 2)  CULTURE, BLOOD (ROUTINE X 2)    EKG None  Radiology CT ABDOMEN PELVIS W CONTRAST  Result Date: 01/23/2021 CLINICAL DATA:  Abdominal pain. Appendectomy. Recent right lower quadrant abscess drain placement. EXAM: CT ABDOMEN AND PELVIS WITH CONTRAST TECHNIQUE: Multidetector CT imaging of the abdomen and pelvis was performed using the standard protocol following bolus administration of intravenous contrast. CONTRAST:  26mL OMNIPAQUE IOHEXOL 350 MG/ML SOLN COMPARISON:  CT abdomen and pelvis 01/19/2021. FINDINGS: Lower chest: The heart is enlarged, unchanged. There are stable small bilateral pleural effusions with bilateral lower lobe atelectasis. Hepatobiliary: Nodular liver contour is unchanged. No focal liver abnormality is seen. Status post cholecystectomy. No biliary dilatation. Pancreas: There are few punctate pancreatic calcifications compatible with chronic pancreatitis. No acute inflammatory stranding identified. Spleen: Normal in size without focal abnormality. Adrenals/Urinary Tract: There is scarring in the right kidney. There is no hydronephrosis or perinephric fluid. Adrenal glands are within normal limits. Bladder is within normal limits. Stomach/Bowel: There is diffuse wall thickening and enhancement of the entire colon to the level of the rectum. There is mild diffuse surrounding inflammatory stranding. There is also mild wall thickening of the distal  ileum and terminal ileum. There is no bowel obstruction. Otherwise, small bowel and stomach are within normal limits. No pneumatosis. Appendix is surgically absent. Vascular/Lymphatic: Aortic atherosclerosis. No enlarged abdominal or pelvic lymph nodes. Reproductive: Prostate is unremarkable. Other: Percutaneous right lower quadrant drain is unchanged in position. There is minimal stranding surrounding the tip of the drainage catheter. There is no residual fluid collection identified. No new fluid collections are identified. There is a small amount of ascites, new from prior. There is a small fat containing umbilical hernia. Musculoskeletal: Degenerative changes affect the spine IMPRESSION: 1. Nonspecific pancolitis.  No bowel obstruction or pneumatosis. 2. Enteritis involving distal ileum and terminal ileum. 3. New small volume ascites. 4. Percutaneous right lower quadrant drainage catheter in place. No residual fluid collection. No new fluid collections. 5. Cirrhosis. 6. Stable bilateral pleural effusions. 7.  Aortic Atherosclerosis (ICD10-I70.0). Electronically Signed   By: Ronney Asters M.D.   On: 01/23/2021 17:27    Procedures Procedures   Medications Ordered in ED Medications  iohexol (OMNIPAQUE) 350 MG/ML injection 75 mL (75 mLs Intravenous Contrast Given 01/23/21 1706)  amoxicillin-clavulanate (AUGMENTIN) 875-125 MG per tablet 1 tablet (1 tablet Oral Given 01/23/21 1827)    ED Course  I have reviewed the triage vital signs and the nursing notes.  Pertinent labs & imaging results that were available during my care of the patient were reviewed by me and considered in my medical decision making (see chart for details).    MDM Rules/Calculators/A&P                           87 year old gentleman presents to ER with concern for fever, diarrhea.  This is in the setting of recent appendectomy, complicated by postoperative infected hematoma requiring drain placement.  Followed by [facility] surgery, Dr. Barry Dienes.  In ER he appears well in no distress.  Basic labs noted for leukocytosis.  Otherwise grossly stable.  CT scan concerning for nonspecific pancolitis, enteritis involving distal ileum and terminal ileum.  Also commented on new small volume ascites.  Percutaneous right lower quadrant drain in place.  Discussed all of these findings in detail with Dr. Georgette Dover on-call for general surgery.  He recommended starting patient on course of antibiotics but felt that this could be done with oral antibiotics in the outpatient setting given the overall clinical picture.  As patient does not have any ongoing medical complaints and stable vital signs, feel this is a reasonable option.  Given his reported fever and leukocytosis, I did offer admission for closer observation.  Lengthy discussion with patient and wife at bedside regarding risks and benefits of admission versus discharge and outpatient management.  Patient and wife strongly desire to pursue outpatient management.  Will start on course of Augmentin per Dr. Vonna Kotyk recommendation and recommended close recheck with primary care doctor and general surgery clinic. Stressed very strict return precautions and discharged.    Final Clinical Impression(s) / ED Diagnoses Final diagnoses:  Colitis  Enteritis  Leukocytosis, unspecified type    Rx / DC Orders ED Discharge Orders          Ordered    amoxicillin-clavulanate (AUGMENTIN) 875-125 MG tablet  Every 12 hours        01/23/21 1858             Lucrezia Starch, MD 01/23/21 [phone]

## 2021-01-23 NOTE — ED Provider Notes (Addendum)
Emergency Medicine Provider Triage Evaluation Note  Anthony Gould , a 87 y.o. male  was evaluated in triage.  Pt complains of fever and diarrhea.  Patient with recent appendectomy with course complicated by right lower quadrant abscess with drain placement.  Patient now with fever without URI symptoms.  Review of Systems  Positive: Diarrhea, fever Negative: Abdominal pain, nausea, vomiting  Physical Exam  BP (!) 128/44 (BP Location: Right Arm)   Pulse 92   Temp 99.4 F (37.4 C)   Resp 16   SpO2 99%  Gen:   Awake, no distress   Resp:  Normal effort  MSK:   Moves extremities without difficulty  Other:    Medical Decision Making  Medically screening exam initiated at 11:09 AM.  Appropriate orders placed.  XYLER TERPENING was informed that the remainder of the evaluation will be completed by another provider, this initial triage assessment does not replace that evaluation, and the importance of remaining in the ED until their evaluation is complete.   Evlyn Courier, [name] 01/23/21 1111    Evlyn Courier, [name] 01/23/21 1115    [place], Alvin Critchley, DO 01/24/21 [phone]

## 2021-01-23 NOTE — ED Triage Notes (Signed)
EMS stated He has had diarrhea for 3 weeks. Had an appendectomy in Sept. And had an abscess form at the site and has a drainage bag. Had fever yesterday that stopped after taking Tylenol.

## 2021-01-24 ENCOUNTER — Ambulatory Visit (INDEPENDENT_AMBULATORY_CARE_PROVIDER_SITE_OTHER): Payer: Medicare Other

## 2021-01-24 ENCOUNTER — Telehealth: Payer: Self-pay | Admitting: *Deleted

## 2021-01-24 ENCOUNTER — Other Ambulatory Visit: Payer: Self-pay

## 2021-01-24 ENCOUNTER — Telehealth: Payer: Self-pay | Admitting: Internal Medicine

## 2021-01-24 DIAGNOSIS — G459 Transient cerebral ischemic attack, unspecified: Secondary | ICD-10-CM

## 2021-01-24 DIAGNOSIS — I6350 Cerebral infarction due to unspecified occlusion or stenosis of unspecified cerebral artery: Secondary | ICD-10-CM

## 2021-01-24 DIAGNOSIS — I059 Rheumatic mitral valve disease, unspecified: Secondary | ICD-10-CM

## 2021-01-24 DIAGNOSIS — I4891 Unspecified atrial fibrillation: Secondary | ICD-10-CM | POA: Diagnosis not present

## 2021-01-24 DIAGNOSIS — Z7901 Long term (current) use of anticoagulants: Secondary | ICD-10-CM

## 2021-01-24 DIAGNOSIS — Z9889 Other specified postprocedural states: Secondary | ICD-10-CM | POA: Diagnosis not present

## 2021-01-24 DIAGNOSIS — I480 Paroxysmal atrial fibrillation: Secondary | ICD-10-CM

## 2021-01-24 DIAGNOSIS — R197 Diarrhea, unspecified: Secondary | ICD-10-CM

## 2021-01-24 LAB — POCT INR: INR: 3.7 — AB (ref 2.0–3.0)

## 2021-01-24 NOTE — Telephone Encounter (Signed)
Call placed to pt regarding appointment 01/25/2021, with [name], [name].  Left a message for pt to call back, as pt was in ED yesterday with concerning symptoms, and may need to reschedule his appointment.

## 2021-01-24 NOTE — Telephone Encounter (Signed)
Patient's spouse states patient was seen in the ed on 01-23-2021  Spouse states patient is now having diarrhea and is requesting a rx for the diarrhea  Patient's next appt is 01-30-2021  Pharmacy [id number] - [place], [place] [address] AT [address]

## 2021-01-24 NOTE — Telephone Encounter (Signed)
Use Imodium A-D as needed Pick up a container and collect loose stool for C. difficile toxin specimen.  Thanks

## 2021-01-24 NOTE — Patient Instructions (Signed)
Description   Take 1/2 tablet tomorrow, then resume same dosage 1 tablet daily except 1/2 tablet on Mondays and Fridays.  Recheck INR in 2 weeks.  Coumadin Clinic [phone]

## 2021-01-25 ENCOUNTER — Other Ambulatory Visit: Payer: Medicare Other

## 2021-01-25 ENCOUNTER — Ambulatory Visit: Payer: Medicare Other | Admitting: Physician Assistant

## 2021-01-25 DIAGNOSIS — R197 Diarrhea, unspecified: Secondary | ICD-10-CM

## 2021-01-25 NOTE — Telephone Encounter (Signed)
Notified pt w/MD response.../lmb 

## 2021-01-27 LAB — CLOSTRIDIUM DIFFICILE EIA: C difficile Toxins A+B, EIA: NEGATIVE

## 2021-01-28 LAB — CULTURE, BLOOD (ROUTINE X 2)
Culture: NO GROWTH
Culture: NO GROWTH

## 2021-01-30 ENCOUNTER — Encounter: Payer: Self-pay | Admitting: Internal Medicine

## 2021-01-30 ENCOUNTER — Ambulatory Visit (INDEPENDENT_AMBULATORY_CARE_PROVIDER_SITE_OTHER): Payer: Medicare Other | Admitting: Internal Medicine

## 2021-01-30 ENCOUNTER — Other Ambulatory Visit: Payer: Self-pay

## 2021-01-30 VITALS — BP 141/58 | HR 69 | Temp 98.1°F | Ht 70.0 in | Wt 164.2 lb

## 2021-01-30 DIAGNOSIS — Z7901 Long term (current) use of anticoagulants: Secondary | ICD-10-CM | POA: Diagnosis not present

## 2021-01-30 DIAGNOSIS — E1159 Type 2 diabetes mellitus with other circulatory complications: Secondary | ICD-10-CM

## 2021-01-30 DIAGNOSIS — Z23 Encounter for immunization: Secondary | ICD-10-CM

## 2021-01-30 DIAGNOSIS — R109 Unspecified abdominal pain: Secondary | ICD-10-CM

## 2021-01-30 DIAGNOSIS — K3533 Acute appendicitis with perforation and localized peritonitis, with abscess: Secondary | ICD-10-CM

## 2021-01-30 NOTE — Assessment & Plan Note (Signed)
Resolved

## 2021-01-30 NOTE — Progress Notes (Signed)
Subjective:  Patient ID: Anthony Gould, male    DOB: Sep 25, 1933  Age: 87 y.o. MRN: [id number]  CC: Follow-up (ER Follow-up)   HPI YAN PANKRATZ presents for an abscess, wt loss, HTN Feeling better, eating better F/u on edema - taking Furosemide prn  Outpatient Medications Prior to Visit  Medication Sig Dispense Refill   acetaminophen (TYLENOL) 500 MG tablet Take 500 mg by mouth every 6 (six) hours as needed for mild pain or headache.     allopurinol (ZYLOPRIM) 300 MG tablet TAKE 1 TABLET BY MOUTH DAILY AS NEEDED. GENERIC EQUIVALENT FOR ZYLOPRIM (Patient taking differently: Take 300 mg by mouth daily.) 90 tablet 3   amLODipine (NORVASC) 5 MG tablet TAKE 1/2 TABLET BY MOUTH EVERY DAY FOR 3 DAYS,IF BLOOD PRESSURE IS STILL HIGHER THAN 130 THEN INCREASE TO 1 TABLET BY MOUTH DAILY (Patient taking differently: Take 5 mg by mouth in the morning.) 90 tablet 3   amoxicillin-clavulanate (AUGMENTIN) 875-125 MG tablet Take 1 tablet by mouth every 12 (twelve) hours. 14 tablet 0   aspirin 81 MG EC tablet Take 81 mg by mouth daily.     atorvastatin (LIPITOR) 20 MG tablet TAKE 1 TABLET BY MOUTH TWO TIMES A WEEK (Patient taking differently: Take 20 mg by mouth See admin instructions. Take 20 mg by mouth at bedtime on Mondays and Fridays) 26 tablet 3   bisoprolol (ZEBETA) 5 MG tablet Take 1 tablet (5 mg total) by mouth daily. Please keep upcoming appt for future refills. Thank you 45 tablet 0   Cholecalciferol (VITAMIN D3) 50 MCG (2000 UT) capsule Take 1 capsule (2,000 Units total) by mouth daily. 100 capsule 3   dipyridamole (PERSANTINE) 50 MG tablet Take 1 tablet (50 mg total) by mouth 3 (three) times daily. 270 tablet 1   enalapril (VASOTEC) 20 MG tablet TAKE 1 TABLET BY MOUTH DAILY. (Patient taking differently: Take 20 mg by mouth in the morning.) 90 tablet 3   feeding supplement (ENSURE ENLIVE / ENSURE PLUS) LIQD Take 237 mLs by mouth 2 (two) times daily between meals. 5000 mL 0   ferrous  [id number] C-folic acid (TRINSICON / FOLTRIN) capsule Take 1 capsule by mouth 2 (two) times daily after a meal. 60 capsule 1   furosemide (LASIX) 40 MG tablet TAKE 1 TABLET BY MOUTH DAILY GENERIC EQUIVALENT FOR LASIX (Patient taking differently: Take 40 mg by mouth in the morning.) 90 tablet 3   latanoprost (XALATAN) 0.005 % ophthalmic solution Place 1 drop into both eyes at bedtime.     Milk Thistle 1000 MG CAPS Take 1,000 mg by mouth daily.     pantoprazole (PROTONIX) 40 MG tablet TAKE 1 TABLET BY MOUTH DAILY (Patient taking differently: Take 40 mg by mouth daily before breakfast.) 90 tablet 3   Sodium Chloride Flush (NORMAL SALINE FLUSH) 0.9 % SOLN Use 5 MLs to flush the drain daily 150 mL 2   warfarin (COUMADIN) 5 MG tablet TAKE 1 TABLET BY MOUTH AS DIRECTED BY THE COUMADIN CLINIC 100 tablet 1   No facility-administered medications prior to visit.    ROS: Review of Systems  Constitutional:  Negative for appetite change, fatigue, fever and unexpected weight change.  HENT:  Negative for congestion, nosebleeds, sneezing, sore throat and trouble swallowing.   Eyes:  Negative for itching and visual disturbance.  Respiratory:  Negative for cough.   Cardiovascular:  Negative for chest pain, palpitations and leg swelling.  Gastrointestinal:  Negative for abdominal distention, abdominal pain, blood in  stool, diarrhea and nausea.  Genitourinary:  Negative for frequency and hematuria.  Musculoskeletal:  Negative for back pain, gait problem, joint swelling and neck pain.  Skin:  Negative for rash.  Neurological:  Negative for dizziness, tremors, speech difficulty and weakness.  Psychiatric/Behavioral:  Negative for agitation, dysphoric mood and sleep disturbance. The patient is not nervous/anxious.    Objective:  BP (!) 141/58 (BP Location: Left Arm)   Pulse 69   Temp 98.1 F (36.7 C) (Oral)   Ht 5\' 10"  (1.778 m)   Wt 164 lb 3.2 oz (74.5 kg)   SpO2 97%   BMI 23.56 kg/m   BP  Readings from Last 3 Encounters:  01/30/21 (!) 141/58  01/23/21 (!) 130/59  01/19/21 131/67    Wt Readings from Last 3 Encounters:  01/30/21 164 lb 3.2 oz (74.5 kg)  01/10/21 175 lb (79.4 kg)  12/30/20 168 lb 10.4 oz (76.5 kg)    Physical Exam Constitutional:      General: He is not in acute distress.    Appearance: He is well-developed.     Comments: NAD  Eyes:     Conjunctiva/sclera: Conjunctivae normal.     Pupils: Pupils are equal, round, and reactive to light.  Neck:     Thyroid: No thyromegaly.     Vascular: No JVD.  Cardiovascular:     Rate and Rhythm: Normal rate and regular rhythm.     Heart sounds: Normal heart sounds. No murmur heard.   No friction rub. No gallop.  Pulmonary:     Effort: Pulmonary effort is normal. No respiratory distress.     Breath sounds: Normal breath sounds. No wheezing or rales.  Chest:     Chest wall: No tenderness.  Abdominal:     General: Bowel sounds are normal. There is no distension.     Palpations: Abdomen is soft. There is no mass.     Tenderness: There is no abdominal tenderness. There is no guarding or rebound.  Musculoskeletal:        General: No tenderness. Normal range of motion.     Cervical back: Normal range of motion.  Lymphadenopathy:     Cervical: No cervical adenopathy.  Skin:    General: Skin is warm and dry.     Findings: No rash.  Neurological:     Mental Status: He is alert and oriented to person, place, and time.     Cranial Nerves: No cranial nerve deficit.     Motor: No abnormal muscle tone.     Coordination: Coordination normal.     Gait: Gait normal.     Deep Tendon Reflexes: Reflexes are normal and symmetric.  Psychiatric:        Behavior: Behavior normal.        Thought Content: Thought content normal.        Judgment: Judgment normal.  Drain is in place - more clear d/c in the bag  Lab Results  Component Value Date   WBC 12.2 (H) 01/23/2021   HGB 11.9 (L) 01/23/2021   HCT 35.0 (L) 01/23/2021    PLT 272 01/23/2021   GLUCOSE 110 (H) 01/23/2021   CHOL 140 04/01/2020   TRIG 96.0 04/01/2020   HDL 58.80 04/01/2020   LDLCALC 62 04/01/2020   ALT 11 01/23/2021   AST 28 01/23/2021   NA 128 (L) 01/23/2021   K 3.3 (L) 01/23/2021   CL 94 (L) 01/23/2021   CREATININE 1.20 01/23/2021   BUN 12 01/23/2021  CO2 25 12/30/2020   TSH 11.11 (H) 07/01/2020   PSA 0.87 10/20/2014   INR 3.7 (A) 01/24/2021   HGBA1C 5.3 12/03/2020    CT ABDOMEN PELVIS W CONTRAST  Result Date: 01/23/2021 CLINICAL DATA:  Abdominal pain. Appendectomy. Recent right lower quadrant abscess drain placement. EXAM: CT ABDOMEN AND PELVIS WITH CONTRAST TECHNIQUE: Multidetector CT imaging of the abdomen and pelvis was performed using the standard protocol following bolus administration of intravenous contrast. CONTRAST:  28mL OMNIPAQUE IOHEXOL 350 MG/ML SOLN COMPARISON:  CT abdomen and pelvis 01/19/2021. FINDINGS: Lower chest: The heart is enlarged, unchanged. There are stable small bilateral pleural effusions with bilateral lower lobe atelectasis. Hepatobiliary: Nodular liver contour is unchanged. No focal liver abnormality is seen. Status post cholecystectomy. No biliary dilatation. Pancreas: There are few punctate pancreatic calcifications compatible with chronic pancreatitis. No acute inflammatory stranding identified. Spleen: Normal in size without focal abnormality. Adrenals/Urinary Tract: There is scarring in the right kidney. There is no hydronephrosis or perinephric fluid. Adrenal glands are within normal limits. Bladder is within normal limits. Stomach/Bowel: There is diffuse wall thickening and enhancement of the entire colon to the level of the rectum. There is mild diffuse surrounding inflammatory stranding. There is also mild wall thickening of the distal ileum and terminal ileum. There is no bowel obstruction. Otherwise, small bowel and stomach are within normal limits. No pneumatosis. Appendix is surgically absent.  Vascular/Lymphatic: Aortic atherosclerosis. No enlarged abdominal or pelvic lymph nodes. Reproductive: Prostate is unremarkable. Other: Percutaneous right lower quadrant drain is unchanged in position. There is minimal stranding surrounding the tip of the drainage catheter. There is no residual fluid collection identified. No new fluid collections are identified. There is a small amount of ascites, new from prior. There is a small fat containing umbilical hernia. Musculoskeletal: Degenerative changes affect the spine IMPRESSION: 1. Nonspecific pancolitis.  No bowel obstruction or pneumatosis. 2. Enteritis involving distal ileum and terminal ileum. 3. New small volume ascites. 4. Percutaneous right lower quadrant drainage catheter in place. No residual fluid collection. No new fluid collections. 5. Cirrhosis. 6. Stable bilateral pleural effusions. 7.  Aortic Atherosclerosis (ICD10-I70.0). Electronically Signed   By: Ronney Asters M.D.   On: 01/23/2021 17:27    Assessment & Plan:   Problem List Items Addressed This Visit     Abdominal pain    Resolved      Appendiceal abscess    S/p IR guided drain placement 10/8. Drain culture growing E. coli.  Per surgery this is infected hematoma.  Doing better Last CT IMPRESSION: 1. Nonspecific pancolitis.  No bowel obstruction or pneumatosis. 2. Enteritis involving distal ileum and terminal ileum. 3. New small volume ascites. 4. Percutaneous right lower quadrant drainage catheter in place. No residual fluid collection. No new fluid collections. 5. Cirrhosis. 6. Stable bilateral pleural effusions. 7.  Aortic Atherosclerosis (ICD10-I70.0) Electronically Signed   By: Ronney Asters M.D.   On: 01/23/2021 17:27  Drain removal is pending      DM2 (diabetes mellitus, type 2) ([place])    NCS diet      Long term (current) use of anticoagulants    Monitor INR. On Coumadin      Other Visit Diagnoses     Needs flu shot    -  Primary   Relevant Orders    Flu Vaccine QUAD High Dose(Fluad) (Completed)         No orders of the defined types were placed in this encounter.     Follow-up: Return in  about 6 weeks (around 03/13/2021) for a follow-up visit.  Walker Kehr, MD

## 2021-01-30 NOTE — Assessment & Plan Note (Signed)
Monitor INR. On Coumadin

## 2021-01-30 NOTE — Assessment & Plan Note (Signed)
NCS diet

## 2021-01-30 NOTE — Progress Notes (Signed)
Subjective:  Patient ID: Anthony Gould, male    DOB: [birth year]  Age: 87 y.o. MRN: [id number]  CC: Follow-up (ER Follow-up)   HPI Anthony Gould presents for an abscess, wt loss, HTN Feeling better, eating better F/u on edema - taking Furosemide prn  Outpatient Medications Prior to Visit  Medication Sig Dispense Refill   acetaminophen (TYLENOL) 500 MG tablet Take 500 mg by mouth every 6 (six) hours as needed for mild pain or headache.     allopurinol (ZYLOPRIM) 300 MG tablet TAKE 1 TABLET BY MOUTH DAILY AS NEEDED. GENERIC EQUIVALENT FOR ZYLOPRIM (Patient taking differently: Take 300 mg by mouth daily.) 90 tablet 3   amLODipine (NORVASC) 5 MG tablet TAKE 1/2 TABLET BY MOUTH EVERY DAY FOR 3 DAYS,IF BLOOD PRESSURE IS STILL HIGHER THAN 130 THEN INCREASE TO 1 TABLET BY MOUTH DAILY (Patient taking differently: Take 5 mg by mouth in the morning.) 90 tablet 3   amoxicillin-clavulanate (AUGMENTIN) 875-125 MG tablet Take 1 tablet by mouth every 12 (twelve) hours. 14 tablet 0   aspirin 81 MG EC tablet Take 81 mg by mouth daily.     atorvastatin (LIPITOR) 20 MG tablet TAKE 1 TABLET BY MOUTH TWO TIMES A WEEK (Patient taking differently: Take 20 mg by mouth See admin instructions. Take 20 mg by mouth at bedtime on Mondays and Fridays) 26 tablet 3   bisoprolol (ZEBETA) 5 MG tablet Take 1 tablet (5 mg total) by mouth daily. Please keep upcoming appt for future refills. Thank you 45 tablet 0   Cholecalciferol (VITAMIN D3) 50 MCG (2000 UT) capsule Take 1 capsule (2,000 Units total) by mouth daily. 100 capsule 3   dipyridamole (PERSANTINE) 50 MG tablet Take 1 tablet (50 mg total) by mouth 3 (three) times daily. 270 tablet 1   enalapril (VASOTEC) 20 MG tablet TAKE 1 TABLET BY MOUTH DAILY. (Patient taking differently: Take 20 mg by mouth in the morning.) 90 tablet 3   feeding supplement (ENSURE ENLIVE / ENSURE PLUS) LIQD Take 237 mLs by mouth 2 (two) times daily between meals. 5000 mL 0   ferrous  [id number] C-folic acid (TRINSICON / FOLTRIN) capsule Take 1 capsule by mouth 2 (two) times daily after a meal. 60 capsule 1   furosemide (LASIX) 40 MG tablet TAKE 1 TABLET BY MOUTH DAILY GENERIC EQUIVALENT FOR LASIX (Patient taking differently: Take 40 mg by mouth in the morning.) 90 tablet 3   latanoprost (XALATAN) 0.005 % ophthalmic solution Place 1 drop into both eyes at bedtime.     Milk Thistle 1000 MG CAPS Take 1,000 mg by mouth daily.     pantoprazole (PROTONIX) 40 MG tablet TAKE 1 TABLET BY MOUTH DAILY (Patient taking differently: Take 40 mg by mouth daily before breakfast.) 90 tablet 3   Sodium Chloride Flush (NORMAL SALINE FLUSH) 0.9 % SOLN Use 5 MLs to flush the drain daily 150 mL 2   warfarin (COUMADIN) 5 MG tablet TAKE 1 TABLET BY MOUTH AS DIRECTED BY THE COUMADIN CLINIC 100 tablet 1   No facility-administered medications prior to visit.    ROS: Review of Systems  Constitutional:  Negative for appetite change, fatigue, fever and unexpected weight change.  HENT:  Negative for congestion, nosebleeds, sneezing, sore throat and trouble swallowing.   Eyes:  Negative for itching and visual disturbance.  Respiratory:  Negative for cough.   Cardiovascular:  Negative for chest pain, palpitations and leg swelling.  Gastrointestinal:  Negative for abdominal distention, abdominal pain, blood in  stool, diarrhea and nausea.  Genitourinary:  Negative for frequency and hematuria.  Musculoskeletal:  Negative for back pain, gait problem, joint swelling and neck pain.  Skin:  Negative for rash.  Neurological:  Negative for dizziness, tremors, speech difficulty and weakness.  Psychiatric/Behavioral:  Negative for agitation, dysphoric mood and sleep disturbance. The patient is not nervous/anxious.    Objective:  BP (!) 141/58 (BP Location: Left Arm)   Pulse 69   Temp 98.1 F (36.7 C) (Oral)   Ht 5\' 10"  (1.778 m)   Wt 164 lb 3.2 oz (74.5 kg)   SpO2 97%   BMI 23.56 kg/m   BP  Readings from Last 3 Encounters:  01/30/21 (!) 141/58  01/23/21 (!) 130/59  01/19/21 131/67    Wt Readings from Last 3 Encounters:  01/30/21 164 lb 3.2 oz (74.5 kg)  01/10/21 175 lb (79.4 kg)  12/30/20 168 lb 10.4 oz (76.5 kg)    Physical Exam Constitutional:      General: He is not in acute distress.    Appearance: He is well-developed.     Comments: NAD  Eyes:     Conjunctiva/sclera: Conjunctivae normal.     Pupils: Pupils are equal, round, and reactive to light.  Neck:     Thyroid: No thyromegaly.     Vascular: No JVD.  Cardiovascular:     Rate and Rhythm: Normal rate and regular rhythm.     Heart sounds: Normal heart sounds. No murmur heard.   No friction rub. No gallop.  Pulmonary:     Effort: Pulmonary effort is normal. No respiratory distress.     Breath sounds: Normal breath sounds. No wheezing or rales.  Chest:     Chest wall: No tenderness.  Abdominal:     General: Bowel sounds are normal. There is no distension.     Palpations: Abdomen is soft. There is no mass.     Tenderness: There is no abdominal tenderness. There is no guarding or rebound.  Musculoskeletal:        General: No tenderness. Normal range of motion.     Cervical back: Normal range of motion.  Lymphadenopathy:     Cervical: No cervical adenopathy.  Skin:    General: Skin is warm and dry.     Findings: No rash.  Neurological:     Mental Status: He is alert and oriented to person, place, and time.     Cranial Nerves: No cranial nerve deficit.     Motor: No abnormal muscle tone.     Coordination: Coordination normal.     Gait: Gait normal.     Deep Tendon Reflexes: Reflexes are normal and symmetric.  Psychiatric:        Behavior: Behavior normal.        Thought Content: Thought content normal.        Judgment: Judgment normal.  Drain is in place - more clear d/c in the bag  Lab Results  Component Value Date   WBC 12.2 (H) 01/23/2021   HGB 11.9 (L) 01/23/2021   HCT 35.0 (L) 01/23/2021    PLT 272 01/23/2021   GLUCOSE 110 (H) 01/23/2021   CHOL 140 04/01/2020   TRIG 96.0 04/01/2020   HDL 58.80 04/01/2020   LDLCALC 62 04/01/2020   ALT 11 01/23/2021   AST 28 01/23/2021   NA 128 (L) 01/23/2021   K 3.3 (L) 01/23/2021   CL 94 (L) 01/23/2021   CREATININE 1.20 01/23/2021   BUN 12 01/23/2021  CO2 25 12/30/2020   TSH 11.11 (H) 07/01/2020   PSA 0.87 10/20/2014   INR 3.7 (A) 01/24/2021   HGBA1C 5.3 12/03/2020    CT ABDOMEN PELVIS W CONTRAST  Result Date: 01/23/2021 CLINICAL DATA:  Abdominal pain. Appendectomy. Recent right lower quadrant abscess drain placement. EXAM: CT ABDOMEN AND PELVIS WITH CONTRAST TECHNIQUE: Multidetector CT imaging of the abdomen and pelvis was performed using the standard protocol following bolus administration of intravenous contrast. CONTRAST:  47mL OMNIPAQUE IOHEXOL 350 MG/ML SOLN COMPARISON:  CT abdomen and pelvis 01/19/2021. FINDINGS: Lower chest: The heart is enlarged, unchanged. There are stable small bilateral pleural effusions with bilateral lower lobe atelectasis. Hepatobiliary: Nodular liver contour is unchanged. No focal liver abnormality is seen. Status post cholecystectomy. No biliary dilatation. Pancreas: There are few punctate pancreatic calcifications compatible with chronic pancreatitis. No acute inflammatory stranding identified. Spleen: Normal in size without focal abnormality. Adrenals/Urinary Tract: There is scarring in the right kidney. There is no hydronephrosis or perinephric fluid. Adrenal glands are within normal limits. Bladder is within normal limits. Stomach/Bowel: There is diffuse wall thickening and enhancement of the entire colon to the level of the rectum. There is mild diffuse surrounding inflammatory stranding. There is also mild wall thickening of the distal ileum and terminal ileum. There is no bowel obstruction. Otherwise, small bowel and stomach are within normal limits. No pneumatosis. Appendix is surgically absent.  Vascular/Lymphatic: Aortic atherosclerosis. No enlarged abdominal or pelvic lymph nodes. Reproductive: Prostate is unremarkable. Other: Percutaneous right lower quadrant drain is unchanged in position. There is minimal stranding surrounding the tip of the drainage catheter. There is no residual fluid collection identified. No new fluid collections are identified. There is a small amount of ascites, new from prior. There is a small fat containing umbilical hernia. Musculoskeletal: Degenerative changes affect the spine IMPRESSION: 1. Nonspecific pancolitis.  No bowel obstruction or pneumatosis. 2. Enteritis involving distal ileum and terminal ileum. 3. New small volume ascites. 4. Percutaneous right lower quadrant drainage catheter in place. No residual fluid collection. No new fluid collections. 5. Cirrhosis. 6. Stable bilateral pleural effusions. 7.  Aortic Atherosclerosis (ICD10-I70.0). Electronically Signed   By: Ronney Asters M.D.   On: 01/23/2021 17:27    Assessment & Plan:   Problem List Items Addressed This Visit     Abdominal pain    Resolved      Appendiceal abscess    S/p IR guided drain placement 10/8. Drain culture growing E. coli.  Per surgery this is infected hematoma.  Doing better Last CT IMPRESSION: 1. Nonspecific pancolitis.  No bowel obstruction or pneumatosis. 2. Enteritis involving distal ileum and terminal ileum. 3. New small volume ascites. 4. Percutaneous right lower quadrant drainage catheter in place. No residual fluid collection. No new fluid collections. 5. Cirrhosis. 6. Stable bilateral pleural effusions. 7.  Aortic Atherosclerosis (ICD10-I70.0) Electronically Signed   By: Ronney Asters M.D.   On: 01/23/2021 17:27  Drain removal is pending      DM2 (diabetes mellitus, type 2) ([place])    NCS diet      Long term (current) use of anticoagulants    Monitor INR. On Coumadin      Other Visit Diagnoses     Needs flu shot    -  Primary   Relevant Orders    Flu Vaccine QUAD High Dose(Fluad) (Completed)         No orders of the defined types were placed in this encounter.     Follow-up: Return in  about 6 weeks (around 03/13/2021) for a follow-up visit.  Walker Kehr, MD

## 2021-01-30 NOTE — Assessment & Plan Note (Signed)
S/p IR guided drain placement 10/8. Drain culture growing E. coli.  Per surgery this is infected hematoma.  Doing better Last CT IMPRESSION: 1. Nonspecific pancolitis.  No bowel obstruction or pneumatosis. 2. Enteritis involving distal ileum and terminal ileum. 3. New small volume ascites. 4. Percutaneous right lower quadrant drainage catheter in place. No residual fluid collection. No new fluid collections. 5. Cirrhosis. 6. Stable bilateral pleural effusions. 7.  Aortic Atherosclerosis (ICD10-I70.0) Electronically Signed   By: Ronney Asters M.D.   On: 01/23/2021 17:27  Drain removal is pending

## 2021-01-31 ENCOUNTER — Telehealth: Payer: Self-pay | Admitting: Internal Medicine

## 2021-01-31 DIAGNOSIS — Z7901 Long term (current) use of anticoagulants: Secondary | ICD-10-CM

## 2021-01-31 NOTE — Telephone Encounter (Signed)
I am sorry.  That should get better.  Take Benadryl 25 mg for chills/fever.  Use cold compress/ice.  Okay to check INR.  Thanks

## 2021-01-31 NOTE — Telephone Encounter (Signed)
Pt. Has called and states there have been changes to his situation from yesterday's visit of flu shot. States that he ran a fever all day yesterday and lastnight, between 100.2-100.5. States he also experienced chills, all in which he thinks can be related to flu shot. Requesting to have test run. States he has been taking Furosemide, which has caused blood at end of his urination yesterday. Denies further usage. Requesting urinalysis and I&R to check blood thickness. Would like a callback to follow- up.    Please advise.    Callback #- [phone])  [phone] (cell)

## 2021-02-01 ENCOUNTER — Other Ambulatory Visit (INDEPENDENT_AMBULATORY_CARE_PROVIDER_SITE_OTHER): Payer: Medicare Other

## 2021-02-01 ENCOUNTER — Other Ambulatory Visit: Payer: Self-pay

## 2021-02-01 DIAGNOSIS — Z7901 Long term (current) use of anticoagulants: Secondary | ICD-10-CM

## 2021-02-01 LAB — PROTIME-INR
INR: 4.8 ratio — ABNORMAL HIGH (ref 0.8–1.0)
Prothrombin Time: 47.5 s — ABNORMAL HIGH (ref 9.6–13.1)

## 2021-02-01 NOTE — Telephone Encounter (Signed)
Notified pt w/MD response. Place order for lab & made lab appt.Marland KitchenJohny Gould

## 2021-02-02 ENCOUNTER — Ambulatory Visit
Admission: RE | Admit: 2021-02-02 | Discharge: 2021-02-02 | Disposition: A | Discharge status: Home / Self Care | Payer: Medicare Other | Source: Ambulatory Visit | Attending: General Surgery | Admitting: General Surgery

## 2021-02-02 DIAGNOSIS — Z4682 Encounter for fitting and adjustment of non-vascular catheter: Secondary | ICD-10-CM | POA: Diagnosis not present

## 2021-02-02 DIAGNOSIS — K651 Peritoneal abscess: Secondary | ICD-10-CM

## 2021-02-02 HISTORY — PX: IR RADIOLOGIST EVAL & MGMT: IMG5224

## 2021-02-02 NOTE — Progress Notes (Signed)
Referring Physician(s): Byerly,Faera  Chief Complaint: Intra-abdominal abscess  History of present illness:  The patient is seen in follow up today s/p appendectomy with intra-abdominal abscess and drain placement.  Drain originally placed 12/31/20.  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  History obtained in conjunction with wife who reports no output of drain in recent days.  They are not flushing the drain at home.  Reports they have been released from surgical follow up at this time.  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   NSVT (nonsustained ventricular tachycardia)    Peripheral edema    venous insufficiency   Permanent atrial fibrillation (HCC)    Pulmonary hypertension ([place])    Syncope    TIA (transient ischemic attack)    while on coumadin   Type II or unspecified type diabetes mellitus without mention of complication, not stated as uncontrolled    lifestyle mangement   Ventricular ectopy    symptomatic    Past Surgical History:  Procedure Laterality Date   CHOLECYSTECTOMY, LAPAROSCOPIC  2003   IR PATIENT EVAL TECH 0-60 MINS  01/09/2021   IR RADIOLOGIST EVAL & MGMT  01/19/2021   IR RADIOLOGIST EVAL & MGMT  02/02/2021   LAPAROSCOPIC APPENDECTOMY N/A 12/05/2020   Procedure: APPENDECTOMY LAPAROSCOPIC;  Surgeon: Stark Klein, MD;  Location: [place];  Service: General;  Laterality: N/A;   MITRAL VALVE REPLACEMENT     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    Allergies: Diltiazem, Sulfa antibiotics, and Sulfonamide derivatives  Medications: Prior to Admission medications   Medication Sig Start Date End Date Taking? Authorizing Provider  acetaminophen (TYLENOL) 500 MG tablet Take 500 mg  by mouth every 6 (six) hours as needed for mild pain or headache.    [provider]  allopurinol (ZYLOPRIM) 300 MG tablet TAKE 1 TABLET BY MOUTH DAILY AS NEEDED. GENERIC EQUIVALENT FOR ZYLOPRIM Patient taking differently: Take 300 mg by mouth daily. 04/14/20   Plotnikov, Evie Lacks, MD  amLODipine (NORVASC) 5 MG tablet TAKE 1/2 TABLET BY MOUTH EVERY DAY FOR 3 DAYS,IF BLOOD PRESSURE IS STILL HIGHER THAN 130 THEN INCREASE TO 1 TABLET BY MOUTH DAILY Patient taking differently: Take 5 mg by mouth in the morning. 03/28/20   Dunn, Nedra Hai, [name]  amoxicillin-clavulanate (AUGMENTIN) 875-125 MG tablet Take 1 tablet by mouth every 12 (twelve) hours. 01/23/21   Lucrezia Starch, MD  aspirin 81 MG EC tablet Take 81 mg by mouth daily.    [provider]  atorvastatin (LIPITOR) 20 MG tablet TAKE 1 TABLET BY MOUTH TWO TIMES A WEEK Patient taking differently: Take 20 mg by mouth See admin instructions. Take 20 mg by mouth at bedtime on Mondays and Fridays 04/14/20   Plotnikov, Evie Lacks, MD  bisoprolol (ZEBETA) 5 MG tablet Take 1 tablet (5 mg total) by mouth daily. Please keep upcoming appt for future refills. Thank you 01/13/21   Burnell Blanks, MD  Cholecalciferol (VITAMIN D3) 50 MCG (2000 UT) capsule Take 1 capsule (2,000 Units total) by mouth daily. 10/01/19   Plotnikov, Evie Lacks, MD  dipyridamole (PERSANTINE) 50 MG tablet Take 1 tablet (50 mg total) by mouth 3 (three) times daily.  08/08/20   Burnell Blanks, MD  enalapril (VASOTEC) 20 MG tablet TAKE 1 TABLET BY MOUTH DAILY. Patient taking differently: Take 20 mg by mouth in the morning. 08/07/20   Plotnikov, Evie Lacks, MD  feeding supplement (ENSURE ENLIVE / ENSURE PLUS) LIQD Take 237 mLs by mouth 2 (two) times daily between meals. 01/03/21   Lavina Hamman, MD  ferrous [id number] C-folic acid (TRINSICON / FOLTRIN) capsule Take 1 capsule by mouth 2 (two) times daily after a meal. 12/18/20   Lorella Nimrod, MD  furosemide  (LASIX) 40 MG tablet TAKE 1 TABLET BY MOUTH DAILY GENERIC EQUIVALENT FOR LASIX Patient taking differently: Take 40 mg by mouth in the morning. 07/04/20   Plotnikov, Evie Lacks, MD  latanoprost (XALATAN) 0.005 % ophthalmic solution Place 1 drop into both eyes at bedtime.    [provider]  Milk Thistle 1000 MG CAPS Take 1,000 mg by mouth daily.    [provider]  pantoprazole (PROTONIX) 40 MG tablet TAKE 1 TABLET BY MOUTH DAILY Patient taking differently: Take 40 mg by mouth daily before breakfast. 08/07/20   Plotnikov, Evie Lacks, MD  Sodium Chloride Flush (NORMAL SALINE FLUSH) 0.9 % SOLN Use 5 MLs to flush the drain daily 01/12/21   Candiss Norse A, [name]  warfarin (COUMADIN) 5 MG tablet TAKE 1 TABLET BY MOUTH AS DIRECTED BY THE COUMADIN CLINIC 06/27/20   Burnell Blanks, MD     Family History  Problem Relation Age of Onset   Coronary artery disease Father    Diabetes Other    Coronary artery disease Other    Prostate cancer Neg Hx    Colon cancer Neg Hx     Social History   Socioeconomic History   Marital status: Married    Spouse name: [name]   Number of children: 2   Years of education: Not on file   Highest education level: Not on file  Occupational History   Occupation: Programme researcher, 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  Tobacco Use   Smoking status: Never   Smokeless tobacco: Never  Substance and Sexual Activity   Alcohol use: No    Alcohol/week: 0.0 standard drinks   Drug use: No   Sexual activity: Not on file  Other Topics Concern   Not on file  Social History Narrative   HSG, many management courses Married '58, 2 sons - '61, '62   Social Determinants of Health   Emergency planning/management officer Strain: Not on Art therapist Insecurity: Not on file  Transportation Needs: Not on file  Physical Activity: Not on file  Stress: Not on file  Social Connections: Not on file     Vital Signs: BP 125/78   Pulse 69   Temp 97.9 F (36.6 C)   SpO2 98%    Physical Exam Skin:    General: Skin is warm and dry.     Comments: RLQ drain and statlock intact.  Site is clean and dry. Drain injection demonstrates contrast filling immediately around the drain and without free spillage or transit of contrast into adjacent structures including the bowel.    Imaging: IR Radiologist Eval & Mgmt  Result Date: 02/02/2021 Please refer to notes tab for details about interventional procedure. (Op Note)   Labs:  CBC: Recent Labs    01/01/21 0146 01/02/21 0049 01/03/21 0134 01/23/21 1112 01/23/21 1127  WBC 5.2 5.9 5.3 12.2*  --   HGB 9.7* 8.7* 8.5* 10.5* 11.9*  HCT 29.6* 26.5* 26.3* 32.6*  35.0*  PLT 332 300 267 272  --     COAGS: Recent Labs    12/04/20 1652 12/05/20 0857 01/09/21 1509 01/13/21 1459 01/24/21 0931 02/01/21 1532  INR  --    < > 3.1* 3.3* 3.7* 4.8*  APTT 55*  --   --   --   --   --    < > = values in this interval not displayed.    BMP: Recent Labs    12/17/20 0351 12/18/20 0245 12/28/20 0840 12/29/20 1520 12/30/20 0506 01/23/21 1127  NA 132* 133* 130* 125* 130* 128*  K 3.7 3.7 3.7 3.5 3.3* 3.3*  CL 100 100 93* 92* 98 94*  CO2 25 25 27 23 25   --   GLUCOSE 93 104* 108* 144* 97 110*  BUN 7* 8 15 16 11 12   CALCIUM 8.1* 8.2* 8.8 8.2* 8.2*  --   CREATININE 0.96 0.93 1.08 1.14 1.04 1.20  GFRNONAA >60 >60  --  >60 >60  --     LIVER FUNCTION TESTS: Recent Labs    12/07/20 1001 12/28/20 0840 12/29/20 1520 01/23/21 1656  BILITOT 1.7* 1.2 1.0 1.4*  AST 31 42* 50* 28  ALT 21 28 31 11   ALKPHOS 61 106 107 65  PROT 5.6* 6.5 6.0* 5.9*  ALBUMIN 2.9* 3.2* 2.7* 3.0*    Assessment:  Resolution of intra-abdominal abscess 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 Site dressed/bandaged and patient left clinic in stable condition.  SignedPasty Spillers, PA 02/02/2021, 1:47 PM   Please refer to Dr. Kathlene Cote attestation of this note for management and plan.

## 2021-02-03 ENCOUNTER — Ambulatory Visit (INDEPENDENT_AMBULATORY_CARE_PROVIDER_SITE_OTHER): Payer: Medicare Other | Admitting: Family Medicine

## 2021-02-03 VITALS — BP 130/50 | HR 70 | Temp 98.0°F

## 2021-02-03 DIAGNOSIS — R197 Diarrhea, unspecified: Secondary | ICD-10-CM

## 2021-02-03 DIAGNOSIS — R509 Fever, unspecified: Secondary | ICD-10-CM | POA: Diagnosis not present

## 2021-02-03 MED ORDER — VANCOMYCIN HCL 125 MG PO CAPS: 125.0000 mg | ORAL_CAPSULE | Freq: Four times a day (QID) | ORAL | 0 refills | Status: DC

## 2021-02-03 NOTE — Patient Instructions (Signed)
Drink plenty of fluids  HOLD Coumadin tomorrow only  Follow up soon (early next week) with coumadin clinic  Start the Vancomycin tablets

## 2021-02-03 NOTE — Progress Notes (Signed)
Established Patient Office Visit  Subjective:  Patient ID: Anthony Gould, male    DOB: 1933-09-09  Age: 87 y.o. MRN: [id number]  CC: No chief complaint on file.   HPI Anthony Gould presents for low-grade fever and diarrhea symptoms.  There was some sort of mixup and he had presented to our clinic instead of his usual clinic.  I was asked to see him as a work in.  His chronic problems include history of A. fib, hypertension, chronic diastolic heart failure, chronic kidney disease, cirrhosis, type 2 diabetes, chronic Coumadin therapy, history of melanoma.    Recent history is that he had appendectomy on 12-05-2020 for acute appendicitis.  He was then readmitted on 6 October through the 11th with postoperative infected hematoma with E. coli.  He had abscess drain placed at that time.  He has been on multiple antibiotics recently including both IV and oral antibiotics.  Just recently completed course of Augmentin after being diagnosed with "colitis "after ER visit on 01-23-2021.  He relates multiple nonbloody diarrhea stools per day up to 7-10 over the past couple weeks.  Fever up to 101 intermittently.  Some mild nausea but no vomiting.  Is keeping down fluids.  Has been using some Imodium.  No fever at this time.  Denies any abdominal pain.  He is on Coumadin and had INR 2 days ago of 4.8.  He did not seem to be aware that this had been done 2 days ago.  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   NSVT (nonsustained ventricular tachycardia)    Peripheral edema    venous insufficiency   Permanent atrial fibrillation (HCC)    Pulmonary hypertension (HCC)    Syncope    TIA (transient ischemic attack)    while on coumadin   Type II or unspecified type diabetes mellitus  without mention of complication, not stated as uncontrolled    lifestyle mangement   Ventricular ectopy    symptomatic    Past Surgical History:  Procedure Laterality Date   CHOLECYSTECTOMY, LAPAROSCOPIC  2003   IR PATIENT EVAL TECH 0-60 MINS  01/09/2021   IR RADIOLOGIST EVAL & MGMT  01/19/2021   IR RADIOLOGIST EVAL & MGMT  02/02/2021   LAPAROSCOPIC APPENDECTOMY N/A 12/05/2020   Procedure: APPENDECTOMY LAPAROSCOPIC;  Surgeon: Stark Klein, MD;  Location: [facility];  Service: General;  Laterality: N/A;   MITRAL VALVE REPLACEMENT     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    Family History  Problem Relation Age of Onset   Coronary artery disease Father    Diabetes Other    Coronary artery disease Other    Prostate cancer Neg Hx    Colon cancer Neg Hx     Social History   Socioeconomic History   Marital status: Married    Spouse name: [name]   Number of children: 2   Years of education: Not on file   Highest education level: Not on file  Occupational History   Occupation: Programme researcher, 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  Tobacco Use   Smoking status: Never   Smokeless tobacco: Never  Substance and Sexual Activity   Alcohol use: No    Alcohol/week: 0.0 standard  drinks   Drug use: No   Sexual activity: Not on file  Other Topics Concern   Not on file  Social History Narrative   HSG, many management courses Married '58, 2 sons - '61, '62   Social Determinants of Health   Emergency planning/management officer Strain: Not on Art therapist Insecurity: Not on file  Transportation Needs: Not on file  Physical Activity: Not on file  Stress: Not on file  Social Connections: Not on file  Intimate Partner Violence: Not on file    Outpatient Medications Prior to Visit  Medication Sig Dispense Refill   acetaminophen (TYLENOL) 500 MG tablet Take 500 mg by mouth every 6 (six) hours as needed for mild pain or headache.     allopurinol (ZYLOPRIM) 300 MG tablet TAKE 1 TABLET BY MOUTH DAILY AS  NEEDED. GENERIC EQUIVALENT FOR ZYLOPRIM (Patient taking differently: Take 300 mg by mouth daily.) 90 tablet 3   amLODipine (NORVASC) 5 MG tablet TAKE 1/2 TABLET BY MOUTH EVERY DAY FOR 3 DAYS,IF BLOOD PRESSURE IS STILL HIGHER THAN 130 THEN INCREASE TO 1 TABLET BY MOUTH DAILY (Patient taking differently: Take 5 mg by mouth in the morning.) 90 tablet 3   amoxicillin-clavulanate (AUGMENTIN) 875-125 MG tablet Take 1 tablet by mouth every 12 (twelve) hours. 14 tablet 0   aspirin 81 MG EC tablet Take 81 mg by mouth daily.     atorvastatin (LIPITOR) 20 MG tablet TAKE 1 TABLET BY MOUTH TWO TIMES A WEEK (Patient taking differently: Take 20 mg by mouth See admin instructions. Take 20 mg by mouth at bedtime on Mondays and Fridays) 26 tablet 3   bisoprolol (ZEBETA) 5 MG tablet Take 1 tablet (5 mg total) by mouth daily. Please keep upcoming appt for future refills. Thank you 45 tablet 0   Cholecalciferol (VITAMIN D3) 50 MCG (2000 UT) capsule Take 1 capsule (2,000 Units total) by mouth daily. 100 capsule 3   dipyridamole (PERSANTINE) 50 MG tablet Take 1 tablet (50 mg total) by mouth 3 (three) times daily. 270 tablet 1   enalapril (VASOTEC) 20 MG tablet TAKE 1 TABLET BY MOUTH DAILY. (Patient taking differently: Take 20 mg by mouth in the morning.) 90 tablet 3   feeding supplement (ENSURE ENLIVE / ENSURE PLUS) LIQD Take 237 mLs by mouth 2 (two) times daily between meals. 5000 mL 0   ferrous [id number] C-folic acid (TRINSICON / FOLTRIN) capsule Take 1 capsule by mouth 2 (two) times daily after a meal. 60 capsule 1   furosemide (LASIX) 40 MG tablet TAKE 1 TABLET BY MOUTH DAILY GENERIC EQUIVALENT FOR LASIX (Patient taking differently: Take 40 mg by mouth in the morning.) 90 tablet 3   latanoprost (XALATAN) 0.005 % ophthalmic solution Place 1 drop into both eyes at bedtime.     Milk Thistle 1000 MG CAPS Take 1,000 mg by mouth daily.     pantoprazole (PROTONIX) 40 MG tablet TAKE 1 TABLET BY MOUTH DAILY (Patient  taking differently: Take 40 mg by mouth daily before breakfast.) 90 tablet 3   Sodium Chloride Flush (NORMAL SALINE FLUSH) 0.9 % SOLN Use 5 MLs to flush the drain daily 150 mL 2   warfarin (COUMADIN) 5 MG tablet TAKE 1 TABLET BY MOUTH AS DIRECTED BY THE COUMADIN CLINIC 100 tablet 1   No facility-administered medications prior to visit.    Allergies  Allergen Reactions   Diltiazem Other (See Comments)    "Bradycardia," per pt; tolerates Amlodipine   Sulfa Antibiotics Rash and Other (See  Comments)    Reaction not fully recalled    Sulfonamide Derivatives Rash and Other (See Comments)    Reaction not fully recalled     ROS Review of Systems  Constitutional:  Positive for fatigue and fever.  Respiratory:  Negative for cough and shortness of breath.   Cardiovascular:  Negative for chest pain.  Gastrointestinal:  Positive for diarrhea. Negative for abdominal pain and blood in stool.  Genitourinary:  Negative for dysuria and flank pain.     Objective:    Physical Exam Vitals reviewed.  Cardiovascular:     Rate and Rhythm: Normal rate.  Pulmonary:     Effort: Pulmonary effort is normal.     Breath sounds: Normal breath sounds. No wheezing or rales.  Abdominal:     Palpations: Abdomen is soft.     Tenderness: There is no abdominal tenderness. There is no guarding or rebound.  Musculoskeletal:     Right lower leg: No edema.     Left lower leg: No edema.  Neurological:     Mental Status: He is alert.    BP (!) 130/50 (BP Location: Left Arm, Patient Position: Sitting, Cuff Size: Normal)   Pulse 70   Temp 98 F (36.7 C) (Oral)   SpO2 98%  Wt Readings from Last 3 Encounters:  01/30/21 164 lb 3.2 oz (74.5 kg)  01/10/21 175 lb (79.4 kg)  12/30/20 168 lb 10.4 oz (76.5 kg)     Health Maintenance Due  Topic Date Due   OPHTHALMOLOGY EXAM  Never done   COVID-19 Vaccine (4 - Booster for Pfizer series) 03/11/2020    There are no preventive care reminders to display for this  patient.  Lab Results  Component Value Date   TSH 11.11 (H) 07/01/2020   Lab Results  Component Value Date   WBC 12.2 (H) 01/23/2021   HGB 11.9 (L) 01/23/2021   HCT 35.0 (L) 01/23/2021   MCV 96.7 01/23/2021   PLT 272 01/23/2021   Lab Results  Component Value Date   NA 128 (L) 01/23/2021   K 3.3 (L) 01/23/2021   CO2 25 12/30/2020   GLUCOSE 110 (H) 01/23/2021   BUN 12 01/23/2021   CREATININE 1.20 01/23/2021   BILITOT 1.4 (H) 01/23/2021   ALKPHOS 65 01/23/2021   AST 28 01/23/2021   ALT 11 01/23/2021   PROT 5.9 (L) 01/23/2021   ALBUMIN 3.0 (L) 01/23/2021   CALCIUM 8.2 (L) 12/30/2020   ANIONGAP 7 12/30/2020   GFR 62.59 12/28/2020   Lab Results  Component Value Date   CHOL 140 04/01/2020   Lab Results  Component Value Date   HDL 58.80 04/01/2020   Lab Results  Component Value Date   LDLCALC 62 04/01/2020   Lab Results  Component Value Date   TRIG 96.0 04/01/2020   Lab Results  Component Value Date   CHOLHDL 2 04/01/2020   Lab Results  Component Value Date   HGBA1C 5.3 12/03/2020      Assessment & Plan:   87 year old male with complicated past medical history as above with recent appendectomy followed by postoperative abscess/drainage on multiple recent course of antibiotics presenting now with several days of diarrhea and reported low-grade fever.  He did have negative C. difficile assay about 10 days ago.  Concerns whether this still could be C. difficile.  High risk for dehydration and complication.  -Check CBC and CMP -Repeat C. difficile EIA -Start vancomycin 125 mg by mouth 4 times daily pending C. difficile  assay -We did recommend that he hold his Coumadin for 1 day tomorrow and then schedule follow-up with Coumadin clinic early next week to reassess INR with recent INR 4.8   No orders of the defined types were placed in this encounter.   Follow-up: No follow-ups on file.    Carolann Littler, MD

## 2021-02-04 ENCOUNTER — Encounter (HOSPITAL_COMMUNITY): Payer: Self-pay

## 2021-02-04 ENCOUNTER — Inpatient Hospital Stay (HOSPITAL_COMMUNITY): Payer: Medicare Other

## 2021-02-04 ENCOUNTER — Emergency Department (HOSPITAL_COMMUNITY): Payer: Medicare Other

## 2021-02-04 ENCOUNTER — Other Ambulatory Visit: Payer: Self-pay

## 2021-02-04 ENCOUNTER — Inpatient Hospital Stay (HOSPITAL_COMMUNITY)
Admission: EM | Admit: 2021-02-04 | Discharge: 2021-02-12 | DRG: 872 | Disposition: A | Discharge status: Home Health | Payer: Medicare Other | Attending: Internal Medicine | Admitting: Internal Medicine

## 2021-02-04 DIAGNOSIS — A419 Sepsis, unspecified organism: Secondary | ICD-10-CM | POA: Diagnosis not present

## 2021-02-04 DIAGNOSIS — Z8739 Personal history of other diseases of the musculoskeletal system and connective tissue: Secondary | ICD-10-CM | POA: Diagnosis not present

## 2021-02-04 DIAGNOSIS — Z85820 Personal history of malignant melanoma of skin: Secondary | ICD-10-CM | POA: Diagnosis not present

## 2021-02-04 DIAGNOSIS — I130 Hypertensive heart and chronic kidney disease with heart failure and stage 1 through stage 4 chronic kidney disease, or unspecified chronic kidney disease: Secondary | ICD-10-CM | POA: Diagnosis not present

## 2021-02-04 DIAGNOSIS — K7689 Other specified diseases of liver: Secondary | ICD-10-CM | POA: Diagnosis not present

## 2021-02-04 DIAGNOSIS — I4821 Permanent atrial fibrillation: Secondary | ICD-10-CM | POA: Diagnosis present

## 2021-02-04 DIAGNOSIS — R197 Diarrhea, unspecified: Secondary | ICD-10-CM | POA: Diagnosis not present

## 2021-02-04 DIAGNOSIS — A414 Sepsis due to anaerobes: Secondary | ICD-10-CM | POA: Diagnosis not present

## 2021-02-04 DIAGNOSIS — E785 Hyperlipidemia, unspecified: Secondary | ICD-10-CM | POA: Diagnosis not present

## 2021-02-04 DIAGNOSIS — Z8249 Family history of ischemic heart disease and other diseases of the circulatory system: Secondary | ICD-10-CM

## 2021-02-04 DIAGNOSIS — Z79899 Other long term (current) drug therapy: Secondary | ICD-10-CM

## 2021-02-04 DIAGNOSIS — E871 Hypo-osmolality and hyponatremia: Secondary | ICD-10-CM | POA: Diagnosis not present

## 2021-02-04 DIAGNOSIS — N179 Acute kidney failure, unspecified: Secondary | ICD-10-CM | POA: Diagnosis present

## 2021-02-04 DIAGNOSIS — J9811 Atelectasis: Secondary | ICD-10-CM | POA: Diagnosis present

## 2021-02-04 DIAGNOSIS — I10 Essential (primary) hypertension: Secondary | ICD-10-CM | POA: Diagnosis not present

## 2021-02-04 DIAGNOSIS — Z882 Allergy status to sulfonamides status: Secondary | ICD-10-CM

## 2021-02-04 DIAGNOSIS — E1122 Type 2 diabetes mellitus with diabetic chronic kidney disease: Secondary | ICD-10-CM | POA: Diagnosis not present

## 2021-02-04 DIAGNOSIS — I272 Pulmonary hypertension, unspecified: Secondary | ICD-10-CM | POA: Diagnosis present

## 2021-02-04 DIAGNOSIS — M109 Gout, unspecified: Secondary | ICD-10-CM | POA: Diagnosis present

## 2021-02-04 DIAGNOSIS — Z8673 Personal history of transient ischemic attack (TIA), and cerebral infarction without residual deficits: Secondary | ICD-10-CM

## 2021-02-04 DIAGNOSIS — R6520 Severe sepsis without septic shock: Secondary | ICD-10-CM | POA: Diagnosis present

## 2021-02-04 DIAGNOSIS — Z7901 Long term (current) use of anticoagulants: Secondary | ICD-10-CM

## 2021-02-04 DIAGNOSIS — E876 Hypokalemia: Secondary | ICD-10-CM | POA: Diagnosis present

## 2021-02-04 DIAGNOSIS — K8689 Other specified diseases of pancreas: Secondary | ICD-10-CM | POA: Diagnosis not present

## 2021-02-04 DIAGNOSIS — J90 Pleural effusion, not elsewhere classified: Secondary | ICD-10-CM

## 2021-02-04 DIAGNOSIS — K219 Gastro-esophageal reflux disease without esophagitis: Secondary | ICD-10-CM | POA: Diagnosis present

## 2021-02-04 DIAGNOSIS — E861 Hypovolemia: Secondary | ICD-10-CM | POA: Diagnosis present

## 2021-02-04 DIAGNOSIS — Z20822 Contact with and (suspected) exposure to covid-19: Secondary | ICD-10-CM | POA: Diagnosis present

## 2021-02-04 DIAGNOSIS — K7460 Unspecified cirrhosis of liver: Secondary | ICD-10-CM | POA: Diagnosis present

## 2021-02-04 DIAGNOSIS — Z952 Presence of prosthetic heart valve: Secondary | ICD-10-CM

## 2021-02-04 DIAGNOSIS — Z66 Do not resuscitate: Secondary | ICD-10-CM | POA: Diagnosis present

## 2021-02-04 DIAGNOSIS — E872 Acidosis, unspecified: Secondary | ICD-10-CM | POA: Diagnosis not present

## 2021-02-04 DIAGNOSIS — Z888 Allergy status to other drugs, medicaments and biological substances status: Secondary | ICD-10-CM

## 2021-02-04 DIAGNOSIS — Z7982 Long term (current) use of aspirin: Secondary | ICD-10-CM

## 2021-02-04 DIAGNOSIS — R791 Abnormal coagulation profile: Secondary | ICD-10-CM | POA: Diagnosis present

## 2021-02-04 DIAGNOSIS — E860 Dehydration: Secondary | ICD-10-CM | POA: Diagnosis not present

## 2021-02-04 DIAGNOSIS — I517 Cardiomegaly: Secondary | ICD-10-CM | POA: Diagnosis not present

## 2021-02-04 DIAGNOSIS — R531 Weakness: Secondary | ICD-10-CM | POA: Diagnosis not present

## 2021-02-04 DIAGNOSIS — I5032 Chronic diastolic (congestive) heart failure: Secondary | ICD-10-CM | POA: Diagnosis present

## 2021-02-04 DIAGNOSIS — A0472 Enterocolitis due to Clostridium difficile, not specified as recurrent: Secondary | ICD-10-CM | POA: Diagnosis not present

## 2021-02-04 DIAGNOSIS — J811 Chronic pulmonary edema: Secondary | ICD-10-CM | POA: Diagnosis not present

## 2021-02-04 DIAGNOSIS — I959 Hypotension, unspecified: Secondary | ICD-10-CM | POA: Diagnosis not present

## 2021-02-04 DIAGNOSIS — N182 Chronic kidney disease, stage 2 (mild): Secondary | ICD-10-CM | POA: Diagnosis present

## 2021-02-04 DIAGNOSIS — R509 Fever, unspecified: Secondary | ICD-10-CM | POA: Diagnosis not present

## 2021-02-04 DIAGNOSIS — Z833 Family history of diabetes mellitus: Secondary | ICD-10-CM

## 2021-02-04 LAB — LACTIC ACID, PLASMA
Lactic Acid, Venous: 1.9 mmol/L (ref 0.5–1.9)
Lactic Acid, Venous: 2.1 mmol/L (ref 0.5–1.9)
Lactic Acid, Venous: 2.7 mmol/L (ref 0.5–1.9)
Lactic Acid, Venous: 1.3 mmol/L (ref 0.5–1.9)
Lactic Acid, Venous: 1.7 mmol/L (ref 0.5–1.9)

## 2021-02-04 LAB — COMPREHENSIVE METABOLIC PANEL
AG Ratio: 1.5 (calc) (ref 1.0–2.5)
ALT: 7 U/L — ABNORMAL LOW (ref 9–46)
ALT: 10 U/L (ref 0–44)
AST: 22 U/L (ref 10–35)
AST: 24 U/L (ref 15–41)
Albumin: 3.4 g/dL — ABNORMAL LOW (ref 3.6–5.1)
Albumin: 2.9 g/dL — ABNORMAL LOW (ref 3.5–5.0)
Alkaline Phosphatase: 71 U/L (ref 38–126)
Alkaline phosphatase (APISO): 77 U/L (ref 35–144)
Anion gap: 9 (ref 5–15)
BUN/Creatinine Ratio: 14 (calc) (ref 6–22)
BUN: 23 mg/dL (ref 7–25)
BUN: 25 mg/dL — ABNORMAL HIGH (ref 8–23)
CO2: 22 mmol/L (ref 20–32)
CO2: 21 mmol/L — ABNORMAL LOW (ref 22–32)
Calcium: 8 mg/dL — ABNORMAL LOW (ref 8.6–10.3)
Calcium: 7.7 mg/dL — ABNORMAL LOW (ref 8.9–10.3)
Chloride: 94 mmol/L — ABNORMAL LOW (ref 98–110)
Chloride: 92 mmol/L — ABNORMAL LOW (ref 98–111)
Creat: 1.6 mg/dL — ABNORMAL HIGH (ref 0.70–1.22)
Creatinine, Ser: 1.63 mg/dL — ABNORMAL HIGH (ref 0.61–1.24)
GFR, Estimated: 41 mL/min — ABNORMAL LOW (ref >60)
Globulin: 2.3 g/dL (calc) (ref 1.9–3.7)
Glucose, Bld: 151 mg/dL — ABNORMAL HIGH (ref 65–99)
Glucose, Bld: 132 mg/dL — ABNORMAL HIGH (ref 70–99)
Potassium: 3.1 mmol/L — ABNORMAL LOW (ref 3.5–5.3)
Potassium: 2.6 mmol/L — CL (ref 3.5–5.1)
Sodium: 128 mmol/L — ABNORMAL LOW (ref 135–146)
Sodium: 122 mmol/L — ABNORMAL LOW (ref 135–145)
Total Bilirubin: 1.1 mg/dL (ref 0.2–1.2)
Total Bilirubin: 1.3 mg/dL — ABNORMAL HIGH (ref 0.3–1.2)
Total Protein: 5.7 g/dL — ABNORMAL LOW (ref 6.1–8.1)
Total Protein: 5.8 g/dL — ABNORMAL LOW (ref 6.5–8.1)

## 2021-02-04 LAB — TSH: TSH: 6.199 u[IU]/mL — ABNORMAL HIGH (ref 0.350–4.500)

## 2021-02-04 LAB — URINALYSIS, ROUTINE W REFLEX MICROSCOPIC
Bacteria, UA: NONE SEEN
Bilirubin Urine: NEGATIVE
Glucose, UA: NEGATIVE mg/dL
Ketones, ur: NEGATIVE mg/dL
Leukocytes,Ua: NEGATIVE
Nitrite: NEGATIVE
Protein, ur: 30 mg/dL — AB
Specific Gravity, Urine: 1.04 — ABNORMAL HIGH (ref 1.005–1.030)
pH: 5 (ref 5.0–8.0)

## 2021-02-04 LAB — C DIFFICILE QUICK SCREEN W PCR REFLEX
C Diff antigen: POSITIVE — AB
C Diff interpretation: DETECTED
C Diff toxin: POSITIVE — AB

## 2021-02-04 LAB — CBC WITH DIFFERENTIAL/PLATELET
Abs Immature Granulocytes: 0.27 10*3/uL — ABNORMAL HIGH (ref 0.00–0.07)
Absolute Monocytes: 891 cells/uL (ref 200–950)
Basophils Absolute: 81 cells/uL (ref 0–200)
Basophils Absolute: 0 10*3/uL (ref 0.0–0.1)
Basophils Relative: 0.3 %
Basophils Relative: 0 %
Eosinophils Absolute: 81 cells/uL (ref 15–500)
Eosinophils Absolute: 0 10*3/uL (ref 0.0–0.5)
Eosinophils Relative: 0.3 %
Eosinophils Relative: 0 %
HCT: 35.1 % — ABNORMAL LOW (ref 38.5–50.0)
HCT: 29.6 % — ABNORMAL LOW (ref 39.0–52.0)
Hemoglobin: 11.2 g/dL — ABNORMAL LOW (ref 13.2–17.1)
Hemoglobin: 10.2 g/dL — ABNORMAL LOW (ref 13.0–17.0)
Immature Granulocytes: 1 %
Lymphocytes Relative: 3 %
Lymphs Abs: 1296 cells/uL (ref 850–3900)
Lymphs Abs: 0.7 10*3/uL (ref 0.7–4.0)
MCH: 30.3 pg (ref 27.0–33.0)
MCH: 31.3 pg (ref 26.0–34.0)
MCHC: 31.9 g/dL — ABNORMAL LOW (ref 32.0–36.0)
MCHC: 34.5 g/dL (ref 30.0–36.0)
MCV: 94.9 fL (ref 80.0–100.0)
MCV: 90.8 fL (ref 80.0–100.0)
MPV: 9.6 fL (ref 7.5–12.5)
Monocytes Absolute: 0.8 10*3/uL (ref 0.1–1.0)
Monocytes Relative: 3.3 %
Monocytes Relative: 4 %
Neutro Abs: 24651 cells/uL — ABNORMAL HIGH (ref 1500–7800)
Neutro Abs: 20 10*3/uL — ABNORMAL HIGH (ref 1.7–7.7)
Neutrophils Relative %: 91.3 %
Neutrophils Relative %: 92 %
Platelets: 304 10*3/uL (ref 140–400)
Platelets: 232 10*3/uL (ref 150–400)
RBC: 3.7 10*6/uL — ABNORMAL LOW (ref 4.20–5.80)
RBC: 3.26 MIL/uL — ABNORMAL LOW (ref 4.22–5.81)
RDW: 15.4 % — ABNORMAL HIGH (ref 11.0–15.0)
RDW: 15.3 % (ref 11.5–15.5)
Total Lymphocyte: 4.8 %
WBC: 27 10*3/uL — ABNORMAL HIGH (ref 3.8–10.8)
WBC: 21.7 10*3/uL — ABNORMAL HIGH (ref 4.0–10.5)
nRBC: 0 % (ref 0.0–0.2)

## 2021-02-04 LAB — BASIC METABOLIC PANEL
Anion gap: 7 (ref 5–15)
Anion gap: 6 (ref 5–15)
BUN: 24 mg/dL — ABNORMAL HIGH (ref 8–23)
BUN: 24 mg/dL — ABNORMAL HIGH (ref 8–23)
CO2: 23 mmol/L (ref 22–32)
CO2: 20 mmol/L — ABNORMAL LOW (ref 22–32)
Calcium: 7.6 mg/dL — ABNORMAL LOW (ref 8.9–10.3)
Calcium: 7.4 mg/dL — ABNORMAL LOW (ref 8.9–10.3)
Chloride: 94 mmol/L — ABNORMAL LOW (ref 98–111)
Chloride: 97 mmol/L — ABNORMAL LOW (ref 98–111)
Creatinine, Ser: 1.45 mg/dL — ABNORMAL HIGH (ref 0.61–1.24)
Creatinine, Ser: 1.3 mg/dL — ABNORMAL HIGH (ref 0.61–1.24)
GFR, Estimated: 47 mL/min — ABNORMAL LOW (ref >60)
GFR, Estimated: 54 mL/min — ABNORMAL LOW (ref >60)
Glucose, Bld: 117 mg/dL — ABNORMAL HIGH (ref 70–99)
Glucose, Bld: 134 mg/dL — ABNORMAL HIGH (ref 70–99)
Potassium: 2.8 mmol/L — ABNORMAL LOW (ref 3.5–5.1)
Potassium: 3.8 mmol/L (ref 3.5–5.1)
Sodium: 124 mmol/L — ABNORMAL LOW (ref 135–145)
Sodium: 123 mmol/L — ABNORMAL LOW (ref 135–145)

## 2021-02-04 LAB — OSMOLALITY: Osmolality: 268 mOsm/kg — ABNORMAL LOW (ref 275–295)

## 2021-02-04 LAB — PROTIME-INR
INR: 4.8 (ref 0.8–1.2)
Prothrombin Time: 44.6 seconds — ABNORMAL HIGH (ref 11.4–15.2)

## 2021-02-04 LAB — BRAIN NATRIURETIC PEPTIDE: B Natriuretic Peptide: 660.5 pg/mL — ABNORMAL HIGH (ref 0.0–100.0)

## 2021-02-04 LAB — POTASSIUM: Potassium: 2.6 mmol/L — CL (ref 3.5–5.1)

## 2021-02-04 LAB — APTT: aPTT: 63 seconds — ABNORMAL HIGH (ref 24–36)

## 2021-02-04 LAB — HEMOGLOBIN A1C
Hgb A1c MFr Bld: 4.9 % (ref 4.8–5.6)
Mean Plasma Glucose: 93.93 mg/dL

## 2021-02-04 LAB — RESP PANEL BY RT-PCR (FLU A&B, COVID) ARPGX2
Influenza A by PCR: NEGATIVE
Influenza B by PCR: NEGATIVE
SARS Coronavirus 2 by RT PCR: NEGATIVE

## 2021-02-04 LAB — MAGNESIUM
Magnesium: 1.4 mg/dL — ABNORMAL LOW (ref 1.7–2.4)
Magnesium: 1.6 mg/dL — ABNORMAL LOW (ref 1.7–2.4)

## 2021-02-04 LAB — CORTISOL: Cortisol, Plasma: 50.5 ug/dL

## 2021-02-04 MED ORDER — ALLOPURINOL 300 MG PO TABS
300.0000 mg | ORAL_TABLET | Freq: Every day | ORAL | Status: DC
  Administered 2021-02-04 – 2021-02-12 (×9): 300 mg via ORAL
  Filled 2021-02-04 (×9): qty 1

## 2021-02-04 MED ORDER — ACETAMINOPHEN 650 MG RE SUPP: 650.0000 mg | Freq: Four times a day (QID) | RECTAL | Status: DC | PRN

## 2021-02-04 MED ORDER — POTASSIUM CHLORIDE CRYS ER 20 MEQ PO TBCR
40.0000 meq | EXTENDED_RELEASE_TABLET | ORAL | Status: AC
  Administered 2021-02-04 (×3): 40 meq via ORAL
  Filled 2021-02-04 (×3): qty 2

## 2021-02-04 MED ORDER — SODIUM CHLORIDE 0.9 % IV SOLN: INTRAVENOUS | Status: AC

## 2021-02-04 MED ORDER — METRONIDAZOLE 500 MG/100ML IV SOLN
500.0000 mg | Freq: Once | INTRAVENOUS | Status: AC
  Administered 2021-02-04: 500 mg via INTRAVENOUS
  Filled 2021-02-04: qty 100

## 2021-02-04 MED ORDER — LACTATED RINGERS IV SOLN: INTRAVENOUS | Status: DC

## 2021-02-04 MED ORDER — LACTATED RINGERS IV BOLUS (SEPSIS)
1000.0000 mL | Freq: Once | INTRAVENOUS | Status: AC
  Administered 2021-02-04: 1000 mL via INTRAVENOUS

## 2021-02-04 MED ORDER — MAGNESIUM SULFATE 2 GM/50ML IV SOLN
2.0000 g | Freq: Once | INTRAVENOUS | Status: AC
  Administered 2021-02-04: 2 g via INTRAVENOUS
  Filled 2021-02-04: qty 50

## 2021-02-04 MED ORDER — POTASSIUM CHLORIDE CRYS ER 20 MEQ PO TBCR: 40.0000 meq | EXTENDED_RELEASE_TABLET | Freq: Once | ORAL | Status: DC

## 2021-02-04 MED ORDER — ATORVASTATIN CALCIUM 20 MG PO TABS
20.0000 mg | ORAL_TABLET | ORAL | Status: DC
  Administered 2021-02-06 – 2021-02-10 (×2): 20 mg via ORAL
  Filled 2021-02-04 (×2): qty 1
  Filled 2021-02-04: qty 2

## 2021-02-04 MED ORDER — METRONIDAZOLE 500 MG/100ML IV SOLN
500.0000 mg | Freq: Three times a day (TID) | INTRAVENOUS | Status: DC
  Administered 2021-02-04 – 2021-02-06 (×6): 500 mg via INTRAVENOUS
  Filled 2021-02-04 (×6): qty 100

## 2021-02-04 MED ORDER — ACETAMINOPHEN 325 MG PO TABS
650.0000 mg | ORAL_TABLET | Freq: Four times a day (QID) | ORAL | Status: DC | PRN
  Administered 2021-02-04: 650 mg via ORAL
  Filled 2021-02-04: qty 2

## 2021-02-04 MED ORDER — BISOPROLOL FUMARATE 5 MG PO TABS
2.5000 mg | ORAL_TABLET | Freq: Every day | ORAL | Status: DC
  Administered 2021-02-04 – 2021-02-11 (×8): 2.5 mg via ORAL
  Filled 2021-02-04 (×7): qty 1

## 2021-02-04 MED ORDER — SODIUM CHLORIDE 0.9 % IV SOLN
2.0000 g | Freq: Once | INTRAVENOUS | Status: AC
  Administered 2021-02-04: 2 g via INTRAVENOUS
  Filled 2021-02-04: qty 2

## 2021-02-04 MED ORDER — LATANOPROST 0.005 % OP SOLN
1.0000 [drp] | Freq: Every day | OPHTHALMIC | Status: DC
  Administered 2021-02-04 – 2021-02-11 (×8): 1 [drp] via OPHTHALMIC
  Filled 2021-02-04: qty 2.5

## 2021-02-04 MED ORDER — PANTOPRAZOLE SODIUM 40 MG PO TBEC
40.0000 mg | DELAYED_RELEASE_TABLET | Freq: Every day | ORAL | Status: DC
  Administered 2021-02-04: 40 mg via ORAL
  Filled 2021-02-04: qty 1

## 2021-02-04 MED ORDER — IOHEXOL 350 MG/ML SOLN
80.0000 mL | Freq: Once | INTRAVENOUS | Status: AC | PRN
  Administered 2021-02-04: 80 mL via INTRAVENOUS

## 2021-02-04 MED ORDER — LACTATED RINGERS IV BOLUS (SEPSIS)
250.0000 mL | Freq: Once | INTRAVENOUS | Status: AC
  Administered 2021-02-04: 250 mL via INTRAVENOUS

## 2021-02-04 MED ORDER — VANCOMYCIN HCL 125 MG PO CAPS
125.0000 mg | ORAL_CAPSULE | Freq: Four times a day (QID) | ORAL | Status: DC
  Administered 2021-02-04 – 2021-02-05 (×8): 125 mg via ORAL
  Filled 2021-02-04 (×10): qty 1

## 2021-02-04 MED ORDER — POTASSIUM CHLORIDE CRYS ER 20 MEQ PO TBCR
40.0000 meq | EXTENDED_RELEASE_TABLET | Freq: Once | ORAL | Status: AC
  Administered 2021-02-04: 40 meq via ORAL
  Filled 2021-02-04: qty 2

## 2021-02-04 NOTE — ED Triage Notes (Signed)
Patient BIB [facility] from home. Had C dif a week ago. Diarhea the whole week. Saw PCP yesterday, been running a fever. Patient said he had 103F fever at home, he took tylenol and his temp went to 101F. Warm to touch.   EMS vitals 93% room air A fib 153 CBG

## 2021-02-04 NOTE — Progress Notes (Signed)
Pt being followed by [facility] for Sepsis protocol. 

## 2021-02-04 NOTE — Progress Notes (Signed)
A consult was received from an ED physician for Cefepime per pharmacy dosing.  The patient's profile has been reviewed for ht/wt/allergies/indication/available labs.   A one time order has been placed for Cefepime 2gm IV.  Further antibiotics/pharmacy consults should be ordered by admitting physician if indicated.                       Thank you, Netta Cedars PharmD 02/04/2021  2:32 AM

## 2021-02-04 NOTE — Progress Notes (Signed)
   02/04/21 1057  Provider Notification  Provider Name/Title [facility]  Date Provider Notified 02/04/21  Time Provider Notified 1058  Notification Type Page  Notification Reason Critical result  Test performed and critical result Lactic Acid 2.7, Cdiff (+)  Date Critical Result Received 02/04/21  Time Critical Result Received 2094 (message per Charge RN)

## 2021-02-04 NOTE — ED Provider Notes (Signed)
[facility] DEPT Provider Note   CSN: [id number] Arrival date & time: 02/04/21  0206     History Chief Complaint  Patient presents with   Diarrhea   Fever    Anthony Gould is a 87 y.o. male.  The history is provided by the patient, a relative and the spouse.  Diarrhea Quality:  Watery Severity:  Moderate Onset quality:  Gradual Duration:  8 weeks Timing:  Intermittent Progression:  Worsening Relieved by:  Nothing Worsened by:  Nothing Ineffective treatments: just started C diff treatment today, was on augmentin for diarrhea with pan colits until this week. Associated symptoms: fever   Associated symptoms: no arthralgias and no vomiting   Risk factors: recent antibiotic use   Risk factors comment:  Off and on since surgery in september Fever Max temp prior to arrival:  103.1 Temp source:  Oral Severity:  Moderate Onset quality:  Gradual Duration:  2 months Timing:  Intermittent Progression:  Worsening Chronicity:  Recurrent Relieved by:  Nothing Worsened by:  Nothing Associated symptoms: diarrhea   Associated symptoms: no rash and no vomiting   Risk factors: recent sickness   Risk factors comment:  Appendix with post op abscess then pancolitis     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   NSVT (nonsustained ventricular tachycardia)    Peripheral edema    venous insufficiency   Permanent atrial fibrillation (HCC)    Pulmonary hypertension (HCC)    Syncope    TIA (transient ischemic attack)    while on coumadin   Type II or unspecified type diabetes mellitus without mention of complication, not stated as uncontrolled    lifestyle mangement   Ventricular ectopy    symptomatic    Patient Active Problem  List   Diagnosis Date Noted   Appendiceal abscess 01/10/2021   Hyponatremia 12/30/2020   Hypokalemia 12/30/2020   Iron deficiency anemia 12/30/2020   Postoperative infected hematoma due to E. coli 12/29/2020   Supratherapeutic INR 12/29/2020   Anemia 12/28/2020   H/O mitral valve replacement with mechanical valve    Acute appendicitis 12/03/2020   Insomnia 09/27/2020   Grief at loss of child 09/01/2020   Right hip pain 08/10/2020   Elevated TSH 07/07/2020   CRI (chronic renal insufficiency), stage 3 (moderate) ([place]) 04/08/2020   Neoplasm of uncertain behavior of skin 10/01/2019   Hypomagnesemia 04/02/2019   Syncope 03/05/2019   Abdominal pain 01/20/2019   Vitamin D deficiency 11/28/2018   Low back pain 11/26/2018   Skin cancer 11/26/2018   Melanoma in situ ([place]) 08/26/2018   Foot lesion 04/23/2018   Hematuria 12/18/2017   Night sweats 12/18/2017   Chronic diastolic heart failure ([place]) 09/16/2016   Food allergy 02/13/2016   Cirrhosis of liver without ascites ([place]) 10/12/2015   Nausea with vomiting 10/12/2015   Diarrhea 10/03/2015   Emesis 09/20/2015   Aspiration pneumonia ([place]) 09/03/2015   Food poisoning 09/02/2015   Hematemesis 09/02/2015   Cough 09/02/2015   Flank pain, acute 05/02/2015   Actinic keratoses 10/20/2014   NSVT (nonsustained ventricular tachycardia) ([place]) 02/16/2013   Routine health maintenance 02/06/2012   Paroxysmal atrial fibrillation ([place]) 09/06/2011   Long term (current) use of anticoagulants 07/04/2010  Gout 06/27/2010   Mitral valve disorder 06/06/2010   Chest pain 02/28/2010   TOXIC LABYRINTHITIS 08/04/2009   Hearing loss 07/07/2009   DYSPEPSIA, CHRONIC 06/23/2007   DM2 (diabetes mellitus, type 2) ([place]) 02/08/2007   Hyperlipidemia 01/31/2007   Essential hypertension 01/31/2007   Coronary atherosclerosis 01/31/2007   PAF (paroxysmal atrial fibrillation) ([place]) 01/31/2007   Cerebral artery occlusion with cerebral infarction ([place]) 01/31/2007    Transient cerebral ischemia 01/31/2007   MITRAL VALVE REPLACEMENT, HX OF 01/31/2007    Past Surgical History:  Procedure Laterality Date   CHOLECYSTECTOMY, LAPAROSCOPIC  2003   IR PATIENT EVAL TECH 0-60 MINS  01/09/2021   IR RADIOLOGIST EVAL & MGMT  01/19/2021   IR RADIOLOGIST EVAL & MGMT  02/02/2021   LAPAROSCOPIC APPENDECTOMY N/A 12/05/2020   Procedure: APPENDECTOMY LAPAROSCOPIC;  Surgeon: Stark Klein, MD;  Location: [facility];  Service: General;  Laterality: N/A;   MITRAL VALVE REPLACEMENT     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       Family History  Problem Relation Age of Onset   Coronary artery disease Father    Diabetes Other    Coronary artery disease Other    Prostate cancer Neg Hx    Colon cancer Neg Hx     Social History   Tobacco Use   Smoking status: Never   Smokeless tobacco: Never  Substance Use Topics   Alcohol use: No    Alcohol/week: 0.0 standard drinks   Drug use: No    Home Medications Prior to Admission medications   Medication Sig Start Date End Date Taking? Authorizing Provider  acetaminophen (TYLENOL) 500 MG tablet Take 500 mg by mouth every 6 (six) hours as needed for mild pain or headache.    [provider]  allopurinol (ZYLOPRIM) 300 MG tablet TAKE 1 TABLET BY MOUTH DAILY AS NEEDED. GENERIC EQUIVALENT FOR ZYLOPRIM Patient taking differently: Take 300 mg by mouth daily. 04/14/20   Plotnikov, Evie Lacks, MD  amLODipine (NORVASC) 5 MG tablet TAKE 1/2 TABLET BY MOUTH EVERY DAY FOR 3 DAYS,IF BLOOD PRESSURE IS STILL HIGHER THAN 130 THEN INCREASE TO 1 TABLET BY MOUTH DAILY Patient taking differently: Take 5 mg by mouth in the morning. 03/28/20   Dunn, Nedra Hai, [name]  amoxicillin-clavulanate (AUGMENTIN) 875-125 MG tablet Take 1 tablet by mouth every 12 (twelve) hours. 01/23/21   Lucrezia Starch, MD  aspirin 81 MG EC tablet Take 81 mg by mouth daily.    [provider]  atorvastatin (LIPITOR) 20 MG tablet TAKE 1 TABLET BY  MOUTH TWO TIMES A WEEK Patient taking differently: Take 20 mg by mouth See admin instructions. Take 20 mg by mouth at bedtime on Mondays and Fridays 04/14/20   Plotnikov, Evie Lacks, MD  bisoprolol (ZEBETA) 5 MG tablet Take 1 tablet (5 mg total) by mouth daily. Please keep upcoming appt for future refills. Thank you 01/13/21   Burnell Blanks, MD  Cholecalciferol (VITAMIN D3) 50 MCG (2000 UT) capsule Take 1 capsule (2,000 Units total) by mouth daily. 10/01/19   Plotnikov, Evie Lacks, MD  dipyridamole (PERSANTINE) 50 MG tablet Take 1 tablet (50 mg total) by mouth 3 (three) times daily. 08/08/20   Burnell Blanks, MD  enalapril (VASOTEC) 20 MG tablet TAKE 1 TABLET BY MOUTH DAILY. Patient taking differently: Take 20 mg by mouth in the morning. 08/07/20   Plotnikov, Evie Lacks, MD  feeding supplement (ENSURE ENLIVE / ENSURE PLUS) LIQD Take 237 mLs by mouth 2 (two)  times daily between meals. 01/03/21   Lavina Hamman, MD  ferrous [id number] C-folic acid (TRINSICON / FOLTRIN) capsule Take 1 capsule by mouth 2 (two) times daily after a meal. 12/18/20   Lorella Nimrod, MD  furosemide (LASIX) 40 MG tablet TAKE 1 TABLET BY MOUTH DAILY GENERIC EQUIVALENT FOR LASIX Patient taking differently: Take 40 mg by mouth in the morning. 07/04/20   Plotnikov, Evie Lacks, MD  latanoprost (XALATAN) 0.005 % ophthalmic solution Place 1 drop into both eyes at bedtime.    [provider]  Milk Thistle 1000 MG CAPS Take 1,000 mg by mouth daily.    [provider]  pantoprazole (PROTONIX) 40 MG tablet TAKE 1 TABLET BY MOUTH DAILY Patient taking differently: Take 40 mg by mouth daily before breakfast. 08/07/20   Plotnikov, Evie Lacks, MD  Sodium Chloride Flush (NORMAL SALINE FLUSH) 0.9 % SOLN Use 5 MLs to flush the drain daily 01/12/21   Candiss Norse A, [name]  vancomycin (VANCOCIN) 125 MG capsule Take 1 capsule (125 mg total) by mouth 4 (four) times daily for 10 days. 02/03/21 02/13/21   Burchette, Alinda Sierras, MD  warfarin (COUMADIN) 5 MG tablet TAKE 1 TABLET BY MOUTH AS DIRECTED BY THE COUMADIN CLINIC 06/27/20   Burnell Blanks, MD    Allergies    Diltiazem, Sulfa antibiotics, and Sulfonamide derivatives  Review of Systems   Review of Systems  Constitutional:  Positive for fever.  HENT:  Negative for facial swelling.   Eyes:  Negative for redness.  Respiratory:  Negative for wheezing and stridor.   Cardiovascular:  Negative for leg swelling.  Gastrointestinal:  Positive for diarrhea. Negative for vomiting.  Genitourinary:  Negative for difficulty urinating.  Musculoskeletal:  Negative for arthralgias.  Skin:  Negative for rash.  Neurological:  Negative for facial asymmetry.  Psychiatric/Behavioral:  Negative for agitation.   All other systems reviewed and are negative.  Physical Exam Updated Vital Signs BP (!) 121/52   Pulse 88   Temp (!) 100.8 F (38.2 C) (Rectal)   Resp (!) 23   SpO2 95%   Physical Exam Vitals and nursing note reviewed.  Constitutional:      Appearance: Normal appearance. He is not diaphoretic.  HENT:     Head: Normocephalic and atraumatic.     Nose: Nose normal.  Eyes:     Conjunctiva/sclera: Conjunctivae normal.     Pupils: Pupils are equal, round, and reactive to light.  Cardiovascular:     Rate and Rhythm: Regular rhythm. Tachycardia present.     Pulses: Normal pulses.     Heart sounds: Normal heart sounds.  Pulmonary:     Effort: Pulmonary effort is normal.     Breath sounds: Normal breath sounds.  Abdominal:     General: Abdomen is flat. Bowel sounds are decreased.     Palpations: Abdomen is soft. There is no shifting dullness.     Tenderness: There is no guarding or rebound.  Musculoskeletal:        General: Normal range of motion.     Cervical back: Normal range of motion and neck supple.  Skin:    General: Skin is warm and dry.     Capillary Refill: Capillary refill takes less than 2 seconds.  Neurological:      General: No focal deficit present.     Mental Status: He is alert and oriented to person, place, and time.     Deep Tendon Reflexes: Reflexes normal.  Psychiatric:  Mood and Affect: Mood normal.        Behavior: Behavior normal.    ED Results / Procedures / Treatments   Labs (all labs ordered are listed, but only abnormal results are displayed) Results for orders placed or performed during the hospital encounter of 02/04/21  Lactic acid, plasma  Result Value Ref Range   Lactic Acid, Venous 1.9 0.5 - 1.9 mmol/L  Comprehensive metabolic panel  Result Value Ref Range   Sodium 122 (L) 135 - 145 mmol/L   Potassium 2.6 (LL) 3.5 - 5.1 mmol/L   Chloride 92 (L) 98 - 111 mmol/L   CO2 21 (L) 22 - 32 mmol/L   Glucose, Bld 132 (H) 70 - 99 mg/dL   BUN 25 (H) 8 - 23 mg/dL   Creatinine, Ser 1.63 (H) 0.61 - 1.24 mg/dL   Calcium 7.7 (L) 8.9 - 10.3 mg/dL   Total Protein 5.8 (L) 6.5 - 8.1 g/dL   Albumin 2.9 (L) 3.5 - 5.0 g/dL   AST 24 15 - 41 U/L   ALT 10 0 - 44 U/L   Alkaline Phosphatase 71 38 - 126 U/L   Total Bilirubin 1.3 (H) 0.3 - 1.2 mg/dL   GFR, Estimated 41 (L) >60 mL/min   Anion gap 9 5 - 15  CBC WITH DIFFERENTIAL  Result Value Ref Range   WBC 21.7 (H) 4.0 - 10.5 K/uL   RBC 3.26 (L) 4.22 - 5.81 MIL/uL   Hemoglobin 10.2 (L) 13.0 - 17.0 g/dL   HCT 29.6 (L) 39.0 - 52.0 %   MCV 90.8 80.0 - 100.0 fL   MCH 31.3 26.0 - 34.0 pg   MCHC 34.5 30.0 - 36.0 g/dL   RDW 15.3 11.5 - 15.5 %   Platelets 232 150 - 400 K/uL   nRBC 0.0 0.0 - 0.2 %   Neutrophils Relative % 92 %   Neutro Abs 20.0 (H) 1.7 - 7.7 K/uL   Lymphocytes Relative 3 %   Lymphs Abs 0.7 0.7 - 4.0 K/uL   Monocytes Relative 4 %   Monocytes Absolute 0.8 0.1 - 1.0 K/uL   Eosinophils Relative 0 %   Eosinophils Absolute 0.0 0.0 - 0.5 K/uL   Basophils Relative 0 %   Basophils Absolute 0.0 0.0 - 0.1 K/uL   Immature Granulocytes 1 %   Abs Immature Granulocytes 0.27 (H) 0.00 - 0.07 K/uL  Protime-INR  Result Value Ref Range    Prothrombin Time 44.6 (H) 11.4 - 15.2 seconds   INR 4.8 (HH) 0.8 - 1.2  APTT  Result Value Ref Range   aPTT 63 (H) 24 - 36 seconds   CT ABDOMEN PELVIS W CONTRAST  Result Date: 01/23/2021 CLINICAL DATA:  Abdominal pain. Appendectomy. Recent right lower quadrant abscess drain placement. EXAM: CT ABDOMEN AND PELVIS WITH CONTRAST TECHNIQUE: Multidetector CT imaging of the abdomen and pelvis was performed using the standard protocol following bolus administration of intravenous contrast. CONTRAST:  54mL OMNIPAQUE IOHEXOL 350 MG/ML SOLN COMPARISON:  CT abdomen and pelvis 01/19/2021. FINDINGS: Lower chest: The heart is enlarged, unchanged. There are stable small bilateral pleural effusions with bilateral lower lobe atelectasis. Hepatobiliary: Nodular liver contour is unchanged. No focal liver abnormality is seen. Status post cholecystectomy. No biliary dilatation. Pancreas: There are few punctate pancreatic calcifications compatible with chronic pancreatitis. No acute inflammatory stranding identified. Spleen: Normal in size without focal abnormality. Adrenals/Urinary Tract: There is scarring in the right kidney. There is no hydronephrosis or perinephric fluid. Adrenal glands are within normal  limits. Bladder is within normal limits. Stomach/Bowel: There is diffuse wall thickening and enhancement of the entire colon to the level of the rectum. There is mild diffuse surrounding inflammatory stranding. There is also mild wall thickening of the distal ileum and terminal ileum. There is no bowel obstruction. Otherwise, small bowel and stomach are within normal limits. No pneumatosis. Appendix is surgically absent. Vascular/Lymphatic: Aortic atherosclerosis. No enlarged abdominal or pelvic lymph nodes. Reproductive: Prostate is unremarkable. Other: Percutaneous right lower quadrant drain is unchanged in position. There is minimal stranding surrounding the tip of the drainage catheter. There is no residual fluid  collection identified. No new fluid collections are identified. There is a small amount of ascites, new from prior. There is a small fat containing umbilical hernia. Musculoskeletal: Degenerative changes affect the spine IMPRESSION: 1. Nonspecific pancolitis.  No bowel obstruction or pneumatosis. 2. Enteritis involving distal ileum and terminal ileum. 3. New small volume ascites. 4. Percutaneous right lower quadrant drainage catheter in place. No residual fluid collection. No new fluid collections. 5. Cirrhosis. 6. Stable bilateral pleural effusions. 7.  Aortic Atherosclerosis (ICD10-I70.0). Electronically Signed   By: Ronney Asters M.D.   On: 01/23/2021 17:27   CT ABDOMEN PELVIS W CONTRAST  Result Date: 01/19/2021 CLINICAL DATA:  History of intra-abdominal abscess following appendectomy status post percutaneous drain placement on 12/31/2020. He presents for initial follow-up evaluation. EXAM: CT ABDOMEN AND PELVIS WITH CONTRAST TECHNIQUE: Multidetector CT imaging of the abdomen and pelvis was performed using the standard protocol following bolus administration of intravenous contrast. CONTRAST:  133mL ISOVUE-300 IOPAMIDOL (ISOVUE-300) INJECTION 61% COMPARISON:  Prior CT abdomen/pelvis 12/29/2020 FINDINGS: Lower chest: Cardiomegaly with right heart enlargement. Moderate bilateral layering pleural effusions slightly larger on the right than the left. Associated lower lobe atelectasis. Hepatobiliary: Cirrhotic liver. No enhancing liver mass. Surgical changes of prior cholecystectomy. No biliary ductal dilatation. Pancreas: Dystrophic calcifications scattered throughout the pancreas consistent with sequelae of chronic pancreatitis. Spleen: Normal in size without focal abnormality. Adrenals/Urinary Tract: Adrenal glands are unremarkable. Kidneys are normal, without renal calculi, focal lesion, or hydronephrosis. Bladder is unremarkable. Stomach/Bowel: Surgical changes of appendectomy. No focal bowel wall thickening  or evidence of obstruction. Vascular/Lymphatic: Calcifications throughout the abdominal aorta. No aneurysm or dissection. No suspicious lymphadenopathy. Reproductive: Unremarkable. Other: Right lower quadrant percutaneous drainage catheter well positioned within the decreasing right lower quadrant abscess/hematoma. The collection now measures 2.4 x 2.2 x 1.9 cm (volume = 5.3 cm^3) compared to 6.4 x 6.6 x 5.8 cm (volume = 130 cm^3). Of note, the drainage catheter rests immediately adjacent to loops of small bowel. Small volume free fluid present within the anatomic pelvis. Musculoskeletal: No acute fracture or aggressive appearing lytic or blastic osseous lesion. IMPRESSION: 1. Well-positioned drainage catheter within a markedly decreased right lower quadrant abscess cavity. The residual abscess volume is now approximately 5 mL compared to 130 mL just prior to drain placement. 2. Of note, the drainage catheter is directly adjacent to and abutting loops of small bowel in the right lower quadrant. 3. Cardiomegaly with right heart enlargement. 4. Moderate bilateral layering pleural effusions. 5. Additional ancillary findings as above without significant interval change. Aortic Atherosclerosis (ICD10-I70.0). Electronically Signed   By: Jacqulynn Cadet M.D.   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 EXAM: INJECTION OF PERCUTANEOUS DRAINAGE  CATHETER UNDER FLUOROSCOPY MEDICATIONS: None ANESTHESIA/SEDATION: None CONTRAST:  8 mL Isovue 300 FLUOROSCOPY TIME:  18 seconds.  2.0 mGy. COMPLICATIONS: None immediate. PROCEDURE: The percutaneous drainage catheter was injected under fluoroscopy and fluoroscopic images saved. The drain was then cut and removed in its entirety.  FINDINGS: Contrast injection demonstrates no significant abscess cavity with contrast filling the drain and immediate space conforming to the pigtail shape of the distal drain. Contrast refluxes along the drainage catheter itself and exits the catheter exit site. No further demonstration of fistula to the adjacent small bowel. 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 No further abscess cavity. Given findings, the drainage catheter was removed after the drain injection procedure. Electronically Signed   By: Aletta Edouard M.D.   On: 02/02/2021 16:38   DG Sinus/Fist Tube Chk-Non GI  Result Date: 01/19/2021 INDICATION: Right lower quadrant abscess status post laparoscopic appendectomy. Drainage catheter in place. EXAM: Drain injection MEDICATIONS: None. ANESTHESIA/SEDATION: None. COMPLICATIONS: None immediate. PROCEDURE: Well-positioned drainage catheter over the right lower quadrant. Contrast injection demonstrates a collapsed abscess cavity with immediate fistulization into adjacent loops of small bowel in the right lower quadrant. IMPRESSION: 1. Positive for fistulous communication between the nearly resolved abscess cavity and the adjacent small bowel. Electronically Signed   By: Jacqulynn Cadet M.D.   On: 01/19/2021 14:10   DG Chest Port 1 View  Result Date: 02/04/2021 CLINICAL DATA:  Possible sepsis. EXAM: PORTABLE CHEST 1 VIEW COMPARISON:  12/29/2020. FINDINGS: The heart is markedly enlarged and the prostatic cardiac valve is noted. There is atherosclerotic calcification of the aorta. The pulmonary vasculature is within normal limits. Mild airspace disease is noted at the lung bases bilaterally. There is blunting of the right costophrenic angle, possible small pleural effusion. No pneumothorax. No acute osseous abnormality. Sternotomy wires are present over the midline. IMPRESSION: 1. Cardiomegaly. 2. Mild atelectasis or infiltrate at the lung bases.  3. Mild blunting of the costophrenic angle, possible trace pleural effusion. Electronically Signed   By: Brett Fairy M.D.   On: 02/04/2021 02:58   IR PATIENT EVAL TECH 0-60 MINS  Result Date: 01/09/2021 Araceli Bouche     01/18/2021  2:12 PM Drain was evaluated by PA Candiss Norse. She flushed tube and placed new bandage.  IR Radiologist Eval & Mgmt  Result Date: 02/02/2021 Please refer to notes tab for details about interventional procedure. (Op Note)  IR Radiologist Eval & Mgmt  Result Date: 01/19/2021 Please refer to notes tab for details about interventional procedure. 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 EXAM: INJECTION OF PERCUTANEOUS DRAINAGE CATHETER UNDER FLUOROSCOPY MEDICATIONS: None ANESTHESIA/SEDATION: None CONTRAST:  8 mL Isovue 300 FLUOROSCOPY TIME:  18 seconds.  2.0 mGy. COMPLICATIONS: None immediate. PROCEDURE: The percutaneous drainage catheter was injected under fluoroscopy and fluoroscopic images saved. The drain was then cut and removed in its entirety. FINDINGS: Contrast injection demonstrates no significant abscess cavity with contrast filling the drain and immediate space conforming to the pigtail shape of the distal drain. Contrast refluxes along the drainage catheter itself and exits the catheter exit site. No further demonstration of fistula to the adjacent small bowel. 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 No further abscess cavity. Given  findings, the drainage catheter was removed after the drain injection procedure. Electronically Signed   By: Aletta Edouard M.D.   On: 02/02/2021 16:38   DG Chest Port 1 View  Result Date:  02/04/2021 CLINICAL DATA:  Possible sepsis. EXAM: PORTABLE CHEST 1 VIEW COMPARISON:  12/29/2020. FINDINGS: The heart is markedly enlarged and the prostatic cardiac valve is noted. There is atherosclerotic calcification of the aorta. The pulmonary vasculature is within normal limits. Mild airspace disease is noted at the lung bases bilaterally. There is blunting of the right costophrenic angle, possible small pleural effusion. No pneumothorax. No acute osseous abnormality. Sternotomy wires are present over the midline. IMPRESSION: 1. Cardiomegaly. 2. Mild atelectasis or infiltrate at the lung bases. 3. Mild blunting of the costophrenic angle, possible trace pleural effusion. Electronically Signed   By: Brett Fairy M.D.   On: 02/04/2021 02:58   IR Radiologist Eval & Mgmt  Result Date: 02/02/2021 Please refer to notes tab for details about interventional procedure. (Op Note)   Procedures Procedures   Medications Ordered in ED Medications  lactated ringers infusion ( Intravenous New Bag/Given 02/04/21 0251)  lactated ringers bolus 1,000 mL (1,000 mLs Intravenous New Bag/Given 02/04/21 0251)    And  lactated ringers bolus 1,000 mL (1,000 mLs Intravenous New Bag/Given 02/04/21 0251)    And  lactated ringers bolus 250 mL (0 mLs Intravenous Stopped 02/04/21 0314)  ceFEPIme (MAXIPIME) 2 g in sodium chloride 0.9 % 100 mL IVPB (0 g Intravenous Stopped 02/04/21 0349)  metroNIDAZOLE (FLAGYL) IVPB 500 mg (500 mg Intravenous New Bag/Given 02/04/21 0258)    ED Course  I have reviewed the triage vital signs and the nursing notes.  Pertinent labs & imaging results that were available during my care of the patient were reviewed by me and considered in my medical decision making (see chart for details).   MDM Reviewed: previous chart and vitals Reviewed previous: labs and CT scan Interpretation: labs, ECG, x-ray and CT scan (elevated white count and INR, NACPD on CXR) Total time providing critical  care: 75-105 minutes (sepsis bundle with boluses). This excludes time spent performing separately reportable procedures and services. Consults: admitting MD  CRITICAL CARE Performed by: Georgeanne Frankland K Madelein Mahadeo-Rasch Total critical care time: 75 minutes Critical care time was exclusive of separately billable procedures and treating other patients. Critical care was necessary to treat or prevent imminent or life-threatening deterioration. Critical care was time spent personally by me on the following activities: development of treatment plan with patient and/or surrogate as well as nursing, discussions with consultants, evaluation of patient's response to treatment, examination of patient, obtaining history from patient or surrogate, ordering and performing treatments and interventions, ordering and review of laboratory studies, ordering and review of radiographic studies, pulse oximetry and re-evaluation of patient's condition.  Final Clinical Impression(s) / ED Diagnoses Final diagnoses:  None    Rx / DC Orders ED Discharge Orders     None        Mirtha Jain, MD 02/04/21 2330

## 2021-02-04 NOTE — Plan of Care (Signed)
  Problem: Education: Goal: Knowledge of General Education information will improve Description Including pain rating scale, medication(s)/side effects and non-pharmacologic comfort measures Outcome: Progressing   

## 2021-02-04 NOTE — Sepsis Progress Note (Signed)
Secure chat with provider regarding repeat LA since the 2nd LA was higher than the first.

## 2021-02-04 NOTE — H&P (Signed)
History and Physical    Anthony Gould [facility]:[id number] DOB: February 09, 1934 DOA: 02/04/2021  PCP: Cassandria Anger, MD Patient coming from: Home  Chief Complaint: Fever, diarrhea  HPI: Anthony Gould is a 87 y.o. male with medical history significant of CKD stage II, hypertension, hyperlipidemia, gout, chronic diastolic heart failure, mitral regurgitation status post mechanical MVR on Coumadin, permanent A. fib, pulmonary hypertension, TIA, type 2 diabetes, cirrhosis, history of melanoma.  Recent history of acute appendicitis status post appendectomy on 9/12.  Admitted again in early October with postop infected hematoma with E. coli and had an abscess drain placed and was treated with multiple antibiotics.  Drain removed 11/10.  Seen in the ED on 10/31 for fever and diarrhea.  CT done at that time was concerning for pancolitis and was started on Augmentin.  Presented to his PCPs office yesterday complaining of fever and diarrhea for several days.  He was started on oral vancomycin due to concern for C. difficile.  Patient subsequently came into the ED to be evaluated.  In the ED, febrile on arrival.  Labs showing WBC 21.7, hemoglobin 10.2 (no significant change from baseline), platelet count 232k.  Sodium 122, potassium 2.6, chloride 92, bicarb 21, anion gap 9, BUN 25, creatinine 1.6 (baseline 1.0), glucose 132.  Calcium 7.7, albumin 2.9.  Lactic acid 1.9 >2.1.  INR 4.8.  Blood cultures drawn.  Magnesium 1.4.  Chest x-ray showing cardiomegaly, mild atelectasis or infiltrate at the bases, mild blunting of the right costophrenic angle, possible trace pleural effusion.  CT showing persistent proctocolitis.  In addition, moderate right and small left pleural effusions lying dependently in the areas of passive subsegmental atelectasis in the lower lobes bilaterally. Patient was given cefepime, Flagyl, and 2.25 L LR boluses.  Patient states he has taken multiple courses of antibiotics since after his  appendix surgery.  A few weeks ago he started having diarrhea and this past week also having fevers.  He is feeling very weak and not eating much.  He went to see his PCP yesterday and was prescribed a pill for the diarrhea.  After reaching home he spiked a temperature of 103 F and returned to the ED.  Denies abdominal pain or cramping.  Having multiple episodes of watery diarrhea, nonbloody.  Denies nausea or vomiting.  Denies cough, shortness of breath, or chest pain.  Review of Systems:  All systems reviewed and apart from history of presenting illness, are negative.  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   NSVT (nonsustained ventricular tachycardia)    Peripheral edema    venous insufficiency   Permanent atrial fibrillation (HCC)    Pulmonary hypertension (HCC)    Syncope    TIA (transient ischemic attack)    while on coumadin   Type II or unspecified type diabetes mellitus without mention of complication, not stated as uncontrolled    lifestyle mangement   Ventricular ectopy    symptomatic    Past Surgical History:  Procedure Laterality Date   CHOLECYSTECTOMY, LAPAROSCOPIC  2003   IR PATIENT EVAL TECH 0-60 MINS  01/09/2021   IR RADIOLOGIST EVAL & MGMT  01/19/2021   IR RADIOLOGIST EVAL & MGMT  02/02/2021   LAPAROSCOPIC APPENDECTOMY N/A 12/05/2020  Procedure: APPENDECTOMY LAPAROSCOPIC;  Surgeon: Stark Klein, MD;  Location: [facility];  Service: General;  Laterality: N/A;   MITRAL VALVE REPLACEMENT     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     reports that he has never smoked. He has never used smokeless tobacco. He reports that he does not drink alcohol and does not use drugs.  Allergies  Allergen Reactions   Diltiazem Other (See Comments)     "Bradycardia," per pt; tolerates Amlodipine   Sulfa Antibiotics Rash and Other (See Comments)    Reaction not fully recalled    Sulfonamide Derivatives Rash and Other (See Comments)    Reaction not fully recalled     Family History  Problem Relation Age of Onset   Coronary artery disease Father    Diabetes Other    Coronary artery disease Other    Prostate cancer Neg Hx    Colon cancer Neg Hx     Prior to Admission medications   Medication Sig Start Date End Date Taking? Authorizing Provider  acetaminophen (TYLENOL) 500 MG tablet Take 500 mg by mouth every 6 (six) hours as needed for mild pain or headache.   Yes [provider]  allopurinol (ZYLOPRIM) 300 MG tablet TAKE 1 TABLET BY MOUTH DAILY AS NEEDED. GENERIC EQUIVALENT FOR ZYLOPRIM Patient taking differently: Take 300 mg by mouth daily. 04/14/20  Yes Plotnikov, Evie Lacks, MD  amLODipine (NORVASC) 5 MG tablet TAKE 1/2 TABLET BY MOUTH EVERY DAY FOR 3 DAYS,IF BLOOD PRESSURE IS STILL HIGHER THAN 130 THEN INCREASE TO 1 TABLET BY MOUTH DAILY Patient taking differently: Take 5 mg by mouth in the morning. 03/28/20  Yes Dunn, Nedra Hai, [name]  aspirin 81 MG EC tablet Take 81 mg by mouth daily.   Yes [provider]  atorvastatin (LIPITOR) 20 MG tablet TAKE 1 TABLET BY MOUTH TWO TIMES A WEEK Patient taking differently: Take 20 mg by mouth See admin instructions. Take 20 mg by mouth at bedtime on Mondays and Fridays 04/14/20  Yes Plotnikov, Evie Lacks, MD  bisoprolol (ZEBETA) 5 MG tablet Take 1 tablet (5 mg total) by mouth daily. Please keep upcoming appt for future refills. Thank you Patient taking differently: Take 2.5 mg by mouth at bedtime. Please keep upcoming appt for future refills. Thank you 01/13/21  Yes Burnell Blanks, MD  Cholecalciferol (VITAMIN D3) 50 MCG (2000 UT) capsule Take 1 capsule (2,000 Units total) by mouth daily. 10/01/19  Yes Plotnikov, Evie Lacks, MD  dipyridamole (PERSANTINE) 50 MG tablet Take 1 tablet  (50 mg total) by mouth 3 (three) times daily. 08/08/20  Yes Burnell Blanks, MD  enalapril (VASOTEC) 20 MG tablet TAKE 1 TABLET BY MOUTH DAILY. Patient taking differently: Take 20 mg by mouth in the morning. 08/07/20  Yes Plotnikov, Evie Lacks, MD  furosemide (LASIX) 40 MG tablet TAKE 1 TABLET BY MOUTH DAILY GENERIC EQUIVALENT FOR LASIX Patient taking differently: Take 40 mg by mouth in the morning. 07/04/20  Yes Plotnikov, Evie Lacks, MD  latanoprost (XALATAN) 0.005 % ophthalmic solution Place 1 drop into both eyes at bedtime.   Yes [provider]  Milk Thistle 1000 MG CAPS Take 1,000 mg by mouth at bedtime.   Yes [provider]  pantoprazole (PROTONIX) 40 MG tablet TAKE 1 TABLET BY MOUTH DAILY Patient taking differently: Take 40 mg by mouth daily before breakfast. 08/07/20  Yes Plotnikov, Evie Lacks, MD  Sodium Chloride Flush (NORMAL SALINE FLUSH) 0.9 % SOLN Use 5 MLs  to flush the drain daily 01/12/21  Yes Candiss Norse A, [name]  vancomycin (VANCOCIN) 125 MG capsule Take 1 capsule (125 mg total) by mouth 4 (four) times daily for 10 days. 02/03/21 02/13/21 Yes Burchette, Alinda Sierras, MD  warfarin (COUMADIN) 5 MG tablet TAKE 1 TABLET BY MOUTH AS DIRECTED BY THE COUMADIN CLINIC Patient taking differently: Take 2.5-5 mg by mouth See admin instructions. Take 2.5mg  by mouth daily on Monday and Friday, then take 5mg  all other days or as directed by the Coumadin clinic 06/27/20  Yes Burnell Blanks, MD  amoxicillin-clavulanate (AUGMENTIN) 875-125 MG tablet Take 1 tablet by mouth every 12 (twelve) hours. Patient not taking: No sig reported 01/23/21   Lucrezia Starch, MD  feeding supplement (ENSURE ENLIVE / ENSURE PLUS) LIQD Take 237 mLs by mouth 2 (two) times daily between meals. Patient not taking: No sig reported 01/03/21   Lavina Hamman, MD  ferrous [id number] C-folic acid (TRINSICON / FOLTRIN) capsule Take 1 capsule by mouth 2 (two) times daily after a  meal. Patient not taking: No sig reported 12/18/20   Lorella Nimrod, MD    Physical Exam: Vitals:   02/04/21 0335 02/04/21 0400 02/04/21 0430 02/04/21 0550  BP: (!) 121/52 (!) 105/56 (!) 119/55 (!) 127/48  Pulse: 88 82 86 98  Resp: (!) 23 (!) 21 (!) 23 16  Temp:  99 F (37.2 C)  (!) 100.5 F (38.1 C)  TempSrc:  Oral  Oral  SpO2: 95% 99% 100% 91%    Physical Exam Constitutional:      General: He is not in acute distress. HENT:     Head: Normocephalic and atraumatic.     Mouth/Throat:     Mouth: Mucous membranes are dry.  Eyes:     Extraocular Movements: Extraocular movements intact.     Conjunctiva/sclera: Conjunctivae normal.  Cardiovascular:     Rate and Rhythm: Normal rate and regular rhythm.     Pulses: Normal pulses.  Pulmonary:     Effort: Pulmonary effort is normal. No respiratory distress.     Breath sounds: No wheezing or rales.     Comments: Diminished breath sounds at the bases Abdominal:     General: Bowel sounds are normal. There is no distension.     Palpations: Abdomen is soft.     Tenderness: There is no guarding or rebound.     Comments: Mild right lower quadrant tenderness  Musculoskeletal:        General: No swelling or tenderness.     Cervical back: Normal range of motion and neck supple.  Skin:    General: Skin is warm and dry.  Neurological:     General: No focal deficit present.     Mental Status: He is alert and oriented to person, place, and time.     Labs on Admission: I have personally reviewed following labs and imaging studies  CBC: Recent Labs  Lab 02/03/21 1623 02/04/21 0222  WBC 27.0* 21.7*  NEUTROABS 24,651* 20.0*  HGB 11.2* 10.2*  HCT 35.1* 29.6*  MCV 94.9 90.8  PLT 304 601   Basic Metabolic Panel: Recent Labs  Lab 02/03/21 1623 02/04/21 0222 02/04/21 0348  NA 128* 122*  --   K 3.1* 2.6* 2.6*  CL 94* 92*  --   CO2 22 21*  --   GLUCOSE 151* 132*  --   BUN 23 25*  --   CREATININE 1.60* 1.63*  --   CALCIUM 8.0*  7.7*  --  MG  --   --  1.4*   GFR: Estimated Creatinine Clearance: 34.2 mL/min (A) (by C-G formula based on SCr of 1.63 mg/dL (H)). Liver Function Tests: Recent Labs  Lab 02/03/21 1623 02/04/21 0222  AST 22 24  ALT 7* 10  ALKPHOS  --  71  BILITOT 1.1 1.3*  PROT 5.7* 5.8*  ALBUMIN  --  2.9*   No results for input(s): LIPASE, AMYLASE in the last 168 hours. No results for input(s): AMMONIA in the last 168 hours. Coagulation Profile: Recent Labs  Lab 02/01/21 1532 02/04/21 0222  INR 4.8* 4.8*   Cardiac Enzymes: No results for input(s): CKTOTAL, CKMB, CKMBINDEX, TROPONINI in the last 168 hours. BNP (last 3 results) No results for input(s): PROBNP in the last 8760 hours. HbA1C: No results for input(s): HGBA1C in the last 72 hours. CBG: No results for input(s): GLUCAP in the last 168 hours. Lipid Profile: No results for input(s): CHOL, HDL, LDLCALC, TRIG, CHOLHDL, LDLDIRECT in the last 72 hours. Thyroid Function Tests: No results for input(s): TSH, T4TOTAL, FREET4, T3FREE, THYROIDAB in the last 72 hours. Anemia Panel: No results for input(s): VITAMINB12, FOLATE, FERRITIN, TIBC, IRON, RETICCTPCT in the last 72 hours. Urine analysis:    Component Value Date/Time   COLORURINE YELLOW 12/28/2020 0840   APPEARANCEUR CLEAR 12/28/2020 0840   LABSPEC 1.010 12/28/2020 0840   PHURINE 6.0 12/28/2020 0840   GLUCOSEU NEGATIVE 12/28/2020 0840   HGBUR NEGATIVE 12/28/2020 0840   BILIRUBINUR SMALL (A) 12/28/2020 0840   KETONESUR NEGATIVE 12/28/2020 0840   PROTEINUR 30 (A) 08/07/2013 1633   UROBILINOGEN 0.2 12/28/2020 0840   NITRITE NEGATIVE 12/28/2020 0840   LEUKOCYTESUR NEGATIVE 12/28/2020 0840    Radiological Exams on Admission: CT ABDOMEN PELVIS W CONTRAST  Result Date: 02/04/2021 CLINICAL DATA:  87 year old male with history of nonlocalized abdominal pain. Diarrhea for 1 week. Fever. History of C difficile infection. EXAM: CT ABDOMEN AND PELVIS WITH CONTRAST TECHNIQUE:  Multidetector CT imaging of the abdomen and pelvis was performed using the standard protocol following bolus administration of intravenous contrast. CONTRAST:  29mL OMNIPAQUE IOHEXOL 350 MG/ML SOLN COMPARISON:  CT the abdomen and pelvis 01/23/2021. FINDINGS: Lower chest: Cardiomegaly with biatrial dilatation. Mechanical mitral valve. Moderate right and small left pleural effusions lying dependently with associated areas of passive subsegmental atelectasis in the lower lobes of the lungs bilaterally. Hepatobiliary: Liver has a shrunken appearance and nodular contour, indicative of underlying cirrhosis. No discrete cystic or solid hepatic lesions. Small calcified granuloma in segment 8 of the liver incidentally noted. No intra or extrahepatic biliary ductal dilatation. Status post cholecystectomy. Pancreas: Diffuse pancreatic atrophy. No pancreatic mass. No pancreatic ductal dilatation. No pancreatic or peripancreatic fluid collections or inflammatory changes. Spleen: Unremarkable. Adrenals/Urinary Tract: Mild scarring in the upper pole of the right kidney. Left kidney and bilateral adrenal glands are otherwise normal in appearance. No hydroureteronephrosis. Urinary bladder is normal in appearance. Stomach/Bowel: The appearance of the stomach is normal. No pathologic dilatation of small bowel or colon. Diffuse thickening of the wall in the colon and rectum where there is also mucosal hyperenhancement and some associated hypervascularity in the corresponding mesocolon and meso rectum, indicative of proctocolitis. Air-fluid levels are present throughout the colon. Status post appendectomy. Vascular/Lymphatic: Aortic atherosclerosis, without evidence of aneurysm or dissection in the abdominal or pelvic vasculature. No lymphadenopathy noted in the abdomen or pelvis. Reproductive: Prostate gland and seminal vesicles are unremarkable in appearance. Other: Trace volume of fluid in the peritoneal cavity. Previously noted  right lower quadrant pigtail drainage catheter has been removed. A trace amount of fluid tracks into the anterior abdominal wall at the site of the prior catheter placement, presumably along the catheter tract. Trace amount of extraluminal gas and fluid in the right lower quadrant measuring 3.1 x 1.0 cm at the site of the catheter placement (axial image 47 of series 2), presumably within the previously evacuated abscess cavity. No pneumoperitoneum. Musculoskeletal: There are no aggressive appearing lytic or blastic lesions noted in the visualized portions of the skeleton. IMPRESSION: 1. Persistent evidence of proctocolitis, compatible with known C difficile infection. 2. Status post removal of previously noted right lower quadrant pigtail drainage catheter, with small residual evacuated abscess cavity and small amount of fluid tracking to the skin surface presumably along the course of the previously removed catheter. Attention on follow-up studies is recommended to ensure complete resolution of these findings. 3. Morphologic changes in the liver indicative of underlying cirrhosis. 4. Severe diffuse pancreatic atrophy. 5. Cardiomegaly with biatrial dilatation. 6. Moderate right and small left pleural effusions lying dependently with areas of passive subsegmental atelectasis in the lower lobes of the lungs bilaterally. 7. Additional incidental findings, as above. Electronically Signed   By: Vinnie Langton M.D.   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 EXAM: INJECTION OF PERCUTANEOUS DRAINAGE CATHETER UNDER FLUOROSCOPY MEDICATIONS: None ANESTHESIA/SEDATION: None CONTRAST:  8 mL Isovue 300 FLUOROSCOPY TIME:  18 seconds.  2.0 mGy. COMPLICATIONS: None immediate.  PROCEDURE: The percutaneous drainage catheter was injected under fluoroscopy and fluoroscopic images saved. The drain was then cut and removed in its entirety. FINDINGS: Contrast injection demonstrates no significant abscess cavity with contrast filling the drain and immediate space conforming to the pigtail shape of the distal drain. Contrast refluxes along the drainage catheter itself and exits the catheter exit site. No further demonstration of fistula to the adjacent small bowel. 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 No further abscess cavity. Given findings, the drainage catheter was removed after the drain injection procedure. Electronically Signed   By: Aletta Edouard M.D.   On: 02/02/2021 16:38   DG Chest Port 1 View  Result Date: 02/04/2021 CLINICAL DATA:  Possible sepsis. EXAM: PORTABLE CHEST 1 VIEW COMPARISON:  12/29/2020. FINDINGS: The heart is markedly enlarged and the prostatic cardiac valve is noted. There is atherosclerotic calcification of the aorta. The pulmonary vasculature is within normal limits. Mild airspace disease is noted at the lung bases bilaterally. There is blunting of the right costophrenic angle, possible small pleural effusion. No pneumothorax. No acute osseous abnormality. Sternotomy wires are present over the midline. IMPRESSION: 1. Cardiomegaly. 2. Mild atelectasis or infiltrate at the lung bases. 3. Mild blunting of the costophrenic angle, possible trace pleural effusion. Electronically Signed   By: Brett Fairy M.D.   On: 02/04/2021 02:58   IR Radiologist Eval & Mgmt  Result Date: 02/02/2021 Please refer to notes tab for details about interventional procedure. (Op Note)   EKG: Independently reviewed.  A. fib, PVCs.  No significant change since prior tracing.  Assessment/Plan Principal Problem:   Sepsis ([place]) Active Problems:   Hyperlipidemia   Essential hypertension   Hypomagnesemia   Hyponatremia    Sepsis/concern for possible C. difficile colitis Patient reports ongoing diarrhea for several  weeks and fevers this past week in the setting of receiving multiple antibiotics after surgery for acute appendicitis in September complicated by postop infection.  Pancolitis seen on CT done during ED visit on 10/31 and was treated with a 7-day course of Augmentin.  C. difficile testing done 10 days ago was negative.  However, ongoing symptoms concerning and CT done today showing persistent proctocolitis.  Labs showing leukocytosis and mild lactic acidosis. -Oral vancomycin 125 mg 4 times a day.  Patient received 30 cc/kg fluid boluses in the ED, blood pressure stable.  Continue maintenance IV fluid.  Repeat C. difficile testing and GI pathogen panel ordered.  Follow enteric precautions.  Blood cultures pending.  Trend WBC count and lactate.  Hyponatremia Likely due to poor oral intake and ongoing diarrhea.  Sodium down to 122. -Continue IV fluid hydration with normal saline at 125 cc/h.  Monitor sodium level every 6 hours.  Goal rate of correction 4 to 6 mEq in 24 hours.  Check serum osmolarity.  Check urine sodium and osmolarity.  Check TSH and cortisol levels.  Hypokalemia Hypomagnesemia Likely due to poor oral intake and ongoing diarrhea. -Replace potassium and magnesium, continue to monitor electrolytes.  Mild normal anion gap metabolic acidosis Likely due to diarrhea and home diuretic use. -Hold diuretic.  IV fluid hydration and continue to monitor.  AKI on CKD stage II Likely prerenal azotemia from dehydration. BUN 25, creatinine 1.6 (baseline 1.0). -IV fluid hydration.  Monitor renal function and urine output.  Avoid nephrotoxic agents/hold home diuretic and ACE inhibitor.  Pleural effusions CT showing moderate right and small left pleural effusions lying dependently in the areas of passive subsegmental atelectasis in the lower lobes bilaterally.  Patient is not hypoxic at present and not  endorsing any dyspnea. -Will need thoracentesis but INR currently supratherapeutic, needs to be discussed with IR.  Check BNP level.  Chronic diastolic heart failure Appears dehydrated given ongoing diarrhea. -Hold diuretic and monitor volume status closely.  Check BNP level.  Mechanical mitral valve INR currently supratherapeutic at 4.8. -Hold Coumadin and continue to monitor INR.  Pharmacy consulted.  Permanent A. Fib Currently rate controlled. -Hold Coumadin at this time due to supratherapeutic INR.  Continue bisoprolol.  Diet controlled type 2 diabetes -Check A1c  Hypertension Stable. -Continue bisoprolol  Hyperlipidemia -Continue Lipitor  Gout -Continue allopurinol  GERD -Continue Protonix  DVT prophylaxis: Hold Coumadin at this time given supratherapeutic INR Code Status: Patient wishes to be DNR. Family Communication: No family available at this time. Disposition Plan: Status is: Inpatient  Remains inpatient appropriate because: Fevers and ongoing diarrhea concerning for sepsis secondary to C. difficile infection  Level of care: Level of care: Telemetry  The medical decision making on this patient was of high complexity and the patient is at high risk for clinical deterioration, therefore this is a level 3 visit.  Shela Leff MD [facility]  If 7PM-7AM, please contact night-coverage [email or web address]  02/04/2021, 8:51 AM

## 2021-02-04 NOTE — Progress Notes (Signed)
[facility] for Warfarin Indication: Afib and MVR  Allergies  Allergen Reactions   Diltiazem Other (See Comments)    "Bradycardia," per pt; tolerates Amlodipine   Sulfa Antibiotics Rash and Other (See Comments)    Reaction not fully recalled    Sulfonamide Derivatives Rash and Other (See Comments)    Reaction not fully recalled     Patient Measurements:   Heparin Dosing Weight: 74.5 kg  Vital Signs: Temp: 100.5 F (38.1 C) (11/12 0550) Temp Source: Oral (11/12 0550) BP: 127/48 (11/12 0550) Pulse Rate: 98 (11/12 0550)  Labs: Recent Labs    02/01/21 1532 02/03/21 1623 02/04/21 0222  HGB  --  11.2* 10.2*  HCT  --  35.1* 29.6*  PLT  --  304 232  APTT  --   --  63*  LABPROT 47.5*  --  44.6*  INR 4.8*  --  4.8*  CREATININE  --  1.60* 1.63*    Estimated Creatinine Clearance: 34.2 mL/min (A) (by C-G formula based on SCr of 1.63 mg/dL (H)).   Medical History: 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   NSVT (nonsustained ventricular tachycardia)    Peripheral edema    venous insufficiency   Permanent atrial fibrillation (HCC)    Pulmonary hypertension (HCC)    Syncope    TIA (transient ischemic attack)    while on coumadin   Type II or unspecified type diabetes mellitus without mention of complication, not stated as uncontrolled    lifestyle mangement   Ventricular ectopy    symptomatic    Medications:  Scheduled:   allopurinol  300 mg Oral Daily   [START ON 02/06/2021] atorvastatin  20 mg Oral Once per day on Mon Fri   bisoprolol  2.5 mg Oral QHS   latanoprost  1 drop Both Eyes QHS   pantoprazole  40 mg Oral QAC breakfast   vancomycin  125 mg Oral QID    Assessment: suspected CDiff, started on Vancomycin  po as outpatient. On Warfarin PTA for Afib & MVR - 5mg  daily, exc 2.5mg  MF, with last dose 11/11  - 11/9 INR 4.8, 11/11 Office note: hold Warfarin 11/12 - 11/12 INR still 4.8  Goal of Therapy:  INR goal 3-3.5 per outpt clinic note Monitor platelets by anticoagulation protocol: Yes   Plan:  No Warfarin today Protime ordered daily, follow CBC, s/s bleeding  Minda Ditto PharmD 02/04/2021,8:08 AM

## 2021-02-04 NOTE — Progress Notes (Addendum)
Patient seen and examined this morning, admitted overnight, H&P reviewed and agree with the assessment and plan.  Pleasant 87 year old male with history of chronic kidney disease stage II, HTN, HLD, chronic diastolic CHF, mitral regurgitation status post mechanical MVR on chronic Coumadin, permanent A. fib, pulmonary hypertension, DM2, cirrhosis, melanoma comes to the hospital with complaints of diarrhea.  He was diagnosed recently with acute appendicitis and underwent appendicectomy on 9/12.  He had persistent pain in the right lower quadrant and was readmitted in October with an infected hematoma.  He had an abscess in that area which appeared to be infected.  He had a drain placed, grew E. coli and was treated with antibiotics.  Drain was removed recently.  Ever since his second hospitalization he has been having intermittent diarrhea but recently this is gotten much worse.  He also has been having poor appetite and weakness.  Principal problem Severe sepsis due to C. difficile colitis-this is been going on for several weeks, he is extremely dehydrated, hyponatremic, has a significant leukocytosis.  His lactic acid is elevated as well but clinically he looks good and blood pressure is stable.  Febrile on admission to 100.8, afebrile this morning -C. difficile returned positive this morning, as anticipated.  Continue vancomycin, continue IV metronidazole, continue IV fluids.  Active problems Hyponatremia-hypovolemic, continue fluids, monitor  Hypokalemia, hypomagnesemia -replete aggressively and monitor   Mild normal anion gap metabolic acidosis -Likely due to diarrhea and home diuretic use.  Continue fluids, hold diuretics   AKI on CKD stage II -Likely prerenal azotemia from dehydration. BUN 25, creatinine 1.6 (baseline 1.0).  Renal function is improving  Pleural effusions -CT showing moderate right and small left pleural effusions lying dependently in the areas of passive subsegmental  atelectasis in the lower lobes bilaterally.  Patient is not hypoxic at present and has not complained of any shortness of breath.  Perhaps will need thoracentesis at one-point but currently INR is supratherapeutic   Chronic diastolic heart failure -Appears dehydrated given ongoing diarrhea but does have pleural effusions.  Closely monitor fluid status   Mechanical mitral valve -INR currently supratherapeutic at 4.8.  Per pharmacy   Permanent A. Fib -Currently rate controlled.   Diet controlled type 2 diabetes -Check A1c   Hypertension -Stable. Continue bisoprolol   Hyperlipidemia -Continue Lipitor   Gout -Continue allopurinol   GERD -stop Protonix given C. Difficile  Scheduled Meds:  allopurinol  300 mg Oral Daily   [START ON 02/06/2021] atorvastatin  20 mg Oral Once per day on Mon Fri   bisoprolol  2.5 mg Oral QHS   latanoprost  1 drop Both Eyes QHS   pantoprazole  40 mg Oral QAC breakfast   potassium chloride  40 mEq Oral Q3H   vancomycin  125 mg Oral QID   Continuous Infusions:  sodium chloride 125 mL/hr at 02/04/21 0854   metronidazole     PRN Meds:.acetaminophen **OR** acetaminophen  Rolanda Campa M. Cruzita Lederer, MD, PhD [facility]  Between 7 am - 7 pm you can contact me via Amion (for emergencies) or [place] (non urgent matters).  I am not available 7 pm - 7 am, please contact night coverage MD/APP via Amion

## 2021-02-04 NOTE — ED Notes (Signed)
Patient transported to CT 

## 2021-02-05 DIAGNOSIS — R197 Diarrhea, unspecified: Secondary | ICD-10-CM

## 2021-02-05 DIAGNOSIS — N179 Acute kidney failure, unspecified: Secondary | ICD-10-CM

## 2021-02-05 DIAGNOSIS — I10 Essential (primary) hypertension: Secondary | ICD-10-CM

## 2021-02-05 LAB — PROTIME-INR
INR: 4.8 (ref 0.8–1.2)
Prothrombin Time: 45.1 seconds — ABNORMAL HIGH (ref 11.4–15.2)

## 2021-02-05 LAB — CBC
HCT: 31 % — ABNORMAL LOW (ref 39.0–52.0)
Hemoglobin: 10.5 g/dL — ABNORMAL LOW (ref 13.0–17.0)
MCH: 31.2 pg (ref 26.0–34.0)
MCHC: 33.9 g/dL (ref 30.0–36.0)
MCV: 92 fL (ref 80.0–100.0)
Platelets: 268 10*3/uL (ref 150–400)
RBC: 3.37 MIL/uL — ABNORMAL LOW (ref 4.22–5.81)
RDW: 15.4 % (ref 11.5–15.5)
WBC: 25.2 10*3/uL — ABNORMAL HIGH (ref 4.0–10.5)
nRBC: 0 % (ref 0.0–0.2)

## 2021-02-05 LAB — BASIC METABOLIC PANEL
Anion gap: 8 (ref 5–15)
BUN: 24 mg/dL — ABNORMAL HIGH (ref 8–23)
CO2: 18 mmol/L — ABNORMAL LOW (ref 22–32)
Calcium: 7.8 mg/dL — ABNORMAL LOW (ref 8.9–10.3)
Chloride: 99 mmol/L (ref 98–111)
Creatinine, Ser: 1.38 mg/dL — ABNORMAL HIGH (ref 0.61–1.24)
GFR, Estimated: 50 mL/min — ABNORMAL LOW (ref >60)
Glucose, Bld: 105 mg/dL — ABNORMAL HIGH (ref 70–99)
Potassium: 5.3 mmol/L — ABNORMAL HIGH (ref 3.5–5.1)
Sodium: 125 mmol/L — ABNORMAL LOW (ref 135–145)

## 2021-02-05 MED ORDER — SODIUM CHLORIDE 0.9 % IV SOLN: INTRAVENOUS | Status: DC

## 2021-02-05 NOTE — Progress Notes (Signed)
[facility] for Warfarin Indication: Afib and MVR  Allergies  Allergen Reactions   Diltiazem Other (See Comments)    "Bradycardia," per pt; tolerates Amlodipine   Sulfa Antibiotics Rash and Other (See Comments)    Reaction not fully recalled    Sulfonamide Derivatives Rash and Other (See Comments)    Reaction not fully recalled     Patient Measurements:   Heparin Dosing Weight: 74.5 kg  Vital Signs: Temp: 98.9 F (37.2 C) (11/13 0626) Temp Source: Oral (11/13 0626) BP: 116/55 (11/13 0626) Pulse Rate: 84 (11/13 0626)  Labs: Recent Labs    02/03/21 1623 02/03/21 1623 02/04/21 0222 02/04/21 0927 02/04/21 1632 02/05/21 0503  HGB 11.2*  --  10.2*  --   --  10.5*  HCT 35.1*  --  29.6*  --   --  31.0*  PLT 304  --  232  --   --  268  APTT  --   --  63*  --   --   --   LABPROT  --   --  44.6*  --   --  45.1*  INR  --   --  4.8*  --   --  4.8*  CREATININE 1.60*   < > 1.63* 1.45* 1.30* 1.38*   < > = values in this interval not displayed.     Estimated Creatinine Clearance: 40.4 mL/min (A) (by C-G formula based on SCr of 1.38 mg/dL (H)).   Medications:  Scheduled:   allopurinol  300 mg Oral Daily   [START ON 02/06/2021] atorvastatin  20 mg Oral Once per day on Mon Fri   bisoprolol  2.5 mg Oral QHS   latanoprost  1 drop Both Eyes QHS   vancomycin  125 mg Oral QID    Assessment: admit for severe CDiff colitis, started on Vancomycin po as outpatient. On Warfarin PTA for Afib & MVR - 5mg  daily, exc 2.5mg  MF, with last dose 11/11  Today, 02/05/2021: CBC: Hgb low but stable; Plt stable WNL INR remains SUPRAtherapeutic despite holding warfarin Major drug interactions: started Flagyl for Cdiff No bleeding issues per nursing Eating 100% of meals  Goal of Therapy:  INR goal 3-3.5 per outpt clinic note Monitor platelets by anticoagulation protocol: Yes  Plan: Hold warfarin again today Daily INR CBC at least q72 hr while on  warfarin Monitor for signs of bleeding or thrombosis Expect Cdiff treatment will need to continue at discharge; given warfarin + Flagyl interaction, would consider feasibility of alternative treatments (ie vanc alone or Dificid)   Reuel Boom, PharmD, BCPS [phone] 02/05/2021, 1:12 PM

## 2021-02-05 NOTE — Progress Notes (Signed)
PT Cancellation Note  Patient Details Name: Anthony Gould MRN: [id number] DOB: 1933-08-27   Cancelled Treatment:    Reason Eval/Treat Not Completed: Other (comment) Pt with elevated INR.  Pt also remains incontinent of bowels with movement per RN.  Will check back as schedule permits.   Myrtis Hopping [place] 02/05/2021, 2:36 PM Jannette Spanner PT, DPT Acute Rehabilitation Services Pager: [phone] Office: [phone]

## 2021-02-05 NOTE — Progress Notes (Signed)
   02/05/21 1059  Provider Notification  Provider Name/Title [facility]  Date Provider Notified 02/05/21  Time Provider Notified 1059  Notification Type Page  Notification Reason Critical result  Test performed and critical result PT 45.1 INR 4.8  Date Critical Result Received 02/05/21  Time Critical Result Received 1058  Provider response Other (Comment) (acknowledged results)  Date of Provider Response 02/05/21  Time of Provider Response 1101

## 2021-02-05 NOTE — Plan of Care (Signed)
  Problem: Nutrition: Goal: Adequate nutrition will be maintained Outcome: Not Progressing   

## 2021-02-05 NOTE — Progress Notes (Signed)
PROGRESS NOTE  Anthony Gould:[id number] DOB: 29-Aug-1933 DOA: 02/04/2021 PCP: Cassandria Anger, MD   LOS: 1 day   Brief Narrative / Interim history: Pleasant 87 year old male with history of chronic kidney disease stage II, HTN, HLD, chronic diastolic CHF, mitral regurgitation status post mechanical MVR on chronic Coumadin, permanent A. fib, pulmonary hypertension, DM2, cirrhosis, melanoma comes to the hospital with complaints of diarrhea.  He was diagnosed recently with acute appendicitis and underwent appendicectomy on 9/12.  He had persistent pain in the right lower quadrant and was readmitted in October with an infected hematoma.  He had an abscess in that area which appeared to be infected.  He had a drain placed, grew E. coli and was treated with antibiotics.  Drain was removed recently.  Ever since his second hospitalization he has been having intermittent diarrhea but recently this is gotten much worse.  He also has been having poor appetite and weakness.  Subjective / 24h Interval events: He feels about the same this morning.  Has had several liquid stools overnight.  Complains of generalized weakness  Assessment & Plan: Principal problem Severe sepsis due to C. difficile colitis-this is been going on for several weeks, on admission he was extremely dehydrated, hyponatremic, and had significant leukocytosis with WBC 27.  He was febrile and lactic acid was elevated. -C. difficile testing returned positive.  He was placed on vancomycin and IV metronidazole, continue, continue IV fluids   Active problems Hyponatremia-hypovolemic, continue fluids, monitor, gradually improving at 125 this morning  Hypokalemia, hypomagnesemia -potassium now at 5.3, hold further replacements, continue to monitor   Mild normal anion gap metabolic acidosis -Likely due to diarrhea and home diuretic use.  Continue fluids, hold diuretics   AKI on CKD stage II -Likely prerenal azotemia from  dehydration. BUN 25, creatinine 1.6 (baseline 1.0).  Renal function is improving, 1.38 this morning   Pleural effusions -CT showing moderate right and small left pleural effusions lying dependently in the areas of passive subsegmental atelectasis in the lower lobes bilaterally.  Patient is not hypoxic at present and has not complained of any shortness of breath.  Perhaps will need thoracentesis at one-point but currently INR is supratherapeutic   Chronic diastolic heart failure -Appears dehydrated given ongoing diarrhea but does have pleural effusions.  Closely monitor fluid status   Mechanical mitral valve -INR currently supratherapeutic at 4.8.  Monitor, Coumadin per pharmacy   Permanent A. Fib -Currently rate controlled.   Diet controlled type 2 diabetes -A1c 4.9   Hypertension -Stable. Continue bisoprolol   Hyperlipidemia -Continue Lipitor   Gout -Continue allopurinol   GERD -stop Protonix given C. Difficile  Scheduled Meds:  allopurinol  300 mg Oral Daily   [START ON 02/06/2021] atorvastatin  20 mg Oral Once per day on Mon Fri   bisoprolol  2.5 mg Oral QHS   latanoprost  1 drop Both Eyes QHS   vancomycin  125 mg Oral QID   Continuous Infusions:  sodium chloride 75 mL/hr at 02/05/21 0916   metronidazole 500 mg (02/05/21 0445)   PRN Meds:.acetaminophen **OR** acetaminophen  Diet Orders (From admission, onward)     Start     Ordered   02/04/21 0748  Diet full liquid Room service appropriate? Yes; Fluid consistency: Thin  Diet effective now       Question Answer Comment  Room service appropriate? Yes   Fluid consistency: Thin      02/04/21 0747  DVT prophylaxis:      Code Status: DNR  Family Communication: Discussed with son over the phone  Status is: Inpatient  Remains inpatient appropriate because: Persistent symptoms, persistent electrolyte abnormalities  Level of care: Telemetry  Consultants:  None  Procedures:  none  Microbiology   C. difficile-positive  Antimicrobials: P.o. vancomycin, IV metronidazole 11/12   Objective: Vitals:   02/04/21 1551 02/04/21 1748 02/04/21 2028 02/05/21 0626  BP:  (!) 113/48 (!) 121/56 (!) 116/55  Pulse:  87 89 84  Resp:  18 16 16   Temp: 99.2 F (37.3 C) 99 F (37.2 C) 99.1 F (37.3 C) 98.9 F (37.2 C)  TempSrc: Oral  Oral Oral  SpO2:  94% 96% 97%    Intake/Output Summary (Last 24 hours) at 02/05/2021 1044 Last data filed at 02/05/2021 0445 Gross per 24 hour  Intake 1476.05 ml  Output 600 ml  Net 876.05 ml   There were no vitals filed for this visit.  Examination:  Constitutional: NAD Eyes: no scleral icterus ENMT: Mucous membranes are moist.  Neck: normal, supple Respiratory: clear to auscultation bilaterally, no wheezing, no crackles. Normal respiratory effort. No accessory muscle use.  Cardiovascular: Regular rate and rhythm, no murmurs / rubs / gallops. No LE edema. Good peripheral pulses Abdomen: mild tenderness to palpation throughout, no guarding or rebound Musculoskeletal: no clubbing / cyanosis.  Skin: no rashes Neurologic: CN 2-12 grossly intact. Strength 5/5 in all 4.   Data Reviewed: I have independently reviewed following labs and imaging studies   CBC: Recent Labs  Lab 02/03/21 1623 02/04/21 0222 02/05/21 0503  WBC 27.0* 21.7* 25.2*  NEUTROABS 24,651* 20.0*  --   HGB 11.2* 10.2* 10.5*  HCT 35.1* 29.6* 31.0*  MCV 94.9 90.8 92.0  PLT 304 232 270   Basic Metabolic Panel: Recent Labs  Lab 02/03/21 1623 02/04/21 0222 02/04/21 0348 02/04/21 0927 02/04/21 1632 02/05/21 0503  NA 128* 122*  --  124* 123* 125*  K 3.1* 2.6* 2.6* 2.8* 3.8 5.3*  CL 94* 92*  --  94* 97* 99  CO2 22 21*  --  23 20* 18*  GLUCOSE 151* 132*  --  117* 134* 105*  BUN 23 25*  --  24* 24* 24*  CREATININE 1.60* 1.63*  --  1.45* 1.30* 1.38*  CALCIUM 8.0* 7.7*  --  7.6* 7.4* 7.8*  MG  --   --  1.4*  --  1.6*  --    Liver Function Tests: Recent Labs  Lab  02/03/21 1623 02/04/21 0222  AST 22 24  ALT 7* 10  ALKPHOS  --  71  BILITOT 1.1 1.3*  PROT 5.7* 5.8*  ALBUMIN  --  2.9*   Coagulation Profile: Recent Labs  Lab 02/01/21 1532 02/04/21 0222  INR 4.8* 4.8*   HbA1C: Recent Labs    02/04/21 0927  HGBA1C 4.9   CBG: No results for input(s): GLUCAP in the last 168 hours.  Recent Results (from the past 240 hour(s))  Blood Culture (routine x 2)     Status: None (Preliminary result)   Collection Time: 02/04/21  2:22 AM   Specimen: BLOOD  Result Value Ref Range Status   Specimen Description   Final    BLOOD RIGHT ANTECUBITAL Performed at [facility] [address], [place], [place] [address]    Special Requests   Final    BOTTLES DRAWN AEROBIC AND ANAEROBIC Blood Culture results may not be optimal due to an excessive volume of  blood received in culture bottles Performed at [facility], [place] [address], [place], [place] [address]    Culture   Final    NO GROWTH 1 DAY Performed at [facility], [place] [address], [place], [place] [address]    Report Status PENDING  Incomplete  Resp Panel by RT-PCR (Flu A&B, Covid) Nasopharyngeal Swab     Status: None   Collection Time: 02/04/21  2:52 AM   Specimen: Nasopharyngeal Swab; Nasopharyngeal(NP) swabs in vial transport medium  Result Value Ref Range Status   SARS Coronavirus 2 by RT PCR NEGATIVE NEGATIVE Final    Comment: (NOTE) SARS-CoV-2 target nucleic acids are NOT DETECTED.  The SARS-CoV-2 RNA is generally detectable in upper respiratory specimens during the acute phase of infection. The lowest concentration of SARS-CoV-2 viral copies this assay can detect is 138 copies/mL. A negative result does not preclude SARS-Cov-2 infection and should not be used as the sole basis for treatment or other patient management decisions. A negative result may occur with  improper specimen collection/handling, submission of specimen other than  nasopharyngeal swab, presence of viral mutation(s) within the areas targeted by this assay, and inadequate number of viral copies(<138 copies/mL). A negative result must be combined with clinical observations, patient history, and epidemiological information. The expected result is Negative.  Fact Sheet for Patients:  EntrepreneurPulse.com.au  Fact Sheet for Healthcare Providers:  IncredibleEmployment.be  This test is no t yet approved or cleared by the [place] FDA and  has been authorized for detection and/or diagnosis of SARS-CoV-2 by FDA under an Emergency Use Authorization (EUA). This EUA will remain  in effect (meaning this test can be used) for the duration of the COVID-19 declaration under Section 564(b)(1) of the Act, 21 U.S.C.section 360bbb-3(b)(1), unless the authorization is terminated  or revoked sooner.       Influenza A by PCR NEGATIVE NEGATIVE Final   Influenza B by PCR NEGATIVE NEGATIVE Final    Comment: (NOTE) The Xpert Xpress SARS-CoV-2/FLU/RSV plus assay is intended as an aid in the diagnosis of influenza from Nasopharyngeal swab specimens and should not be used as a sole basis for treatment. Nasal washings and aspirates are unacceptable for Xpert Xpress SARS-CoV-2/FLU/RSV testing.  Fact Sheet for Patients: EntrepreneurPulse.com.au  Fact Sheet for Healthcare Providers: IncredibleEmployment.be  This test is not yet approved or cleared by the [place] FDA and has been authorized for detection and/or diagnosis of SARS-CoV-2 by FDA under an Emergency Use Authorization (EUA). This EUA will remain in effect (meaning this test can be used) for the duration of the COVID-19 declaration under Section 564(b)(1) of the Act, 21 U.S.C. section 360bbb-3(b)(1), unless the authorization is terminated or revoked.  Performed at [facility], [place] [address] [place], [place] [address]   Blood Culture (routine x 2)     Status: None (Preliminary result)   Collection Time: 02/04/21  2:57 AM   Specimen: BLOOD  Result Value Ref Range Status   Specimen Description   Final    BLOOD BLOOD RIGHT HAND Performed at [facility] [address] [place], [place] [address]    Special Requests   Final    BOTTLES DRAWN AEROBIC AND ANAEROBIC Blood Culture adequate volume Performed at [facility] [address] [place], [place] [address]    Culture   Final    NO GROWTH 1 DAY Performed at [facility], [place] [address] [place], [place] [address]  Report Status PENDING  Incomplete  C Difficile Quick Screen w PCR reflex     Status: Abnormal   Collection Time: 02/04/21  4:04 AM   Specimen: Stool  Result Value Ref Range Status   C Diff antigen POSITIVE (A) NEGATIVE Final   C Diff toxin POSITIVE (A) NEGATIVE Final   C Diff interpretation Toxin producing C. difficile detected.  Final    Comment: CRITICAL RESULT CALLED TO, READ BACK BY AND VERIFIED WITH: Valma Cava RN ON 02/04/2021 @ 8032 BY GOLSONM Performed at [facility] [address] [place], [place] [address]      Radiology Studies: No results found.   Marzetta Board, MD, PhD [facility]  Between 7 am - 7 pm I am available, please contact me via Amion (for emergencies) or Securechat (non urgent messages)  Between 7 pm - 7 am I am not available, please contact night coverage MD/APP via Amion

## 2021-02-06 ENCOUNTER — Other Ambulatory Visit (HOSPITAL_COMMUNITY): Payer: Self-pay

## 2021-02-06 DIAGNOSIS — E860 Dehydration: Secondary | ICD-10-CM

## 2021-02-06 LAB — COMPREHENSIVE METABOLIC PANEL
ALT: 10 U/L (ref 0–44)
AST: 21 U/L (ref 15–41)
Albumin: 2.2 g/dL — ABNORMAL LOW (ref 3.5–5.0)
Alkaline Phosphatase: 73 U/L (ref 38–126)
Anion gap: 10 (ref 5–15)
BUN: 30 mg/dL — ABNORMAL HIGH (ref 8–23)
CO2: 17 mmol/L — ABNORMAL LOW (ref 22–32)
Calcium: 7.9 mg/dL — ABNORMAL LOW (ref 8.9–10.3)
Chloride: 97 mmol/L — ABNORMAL LOW (ref 98–111)
Creatinine, Ser: 1.31 mg/dL — ABNORMAL HIGH (ref 0.61–1.24)
GFR, Estimated: 53 mL/min — ABNORMAL LOW (ref >60)
Glucose, Bld: 91 mg/dL (ref 70–99)
Potassium: 4.8 mmol/L (ref 3.5–5.1)
Sodium: 124 mmol/L — ABNORMAL LOW (ref 135–145)
Total Bilirubin: 0.9 mg/dL (ref 0.3–1.2)
Total Protein: 4.7 g/dL — ABNORMAL LOW (ref 6.5–8.1)

## 2021-02-06 LAB — PROTIME-INR
INR: 5.5 (ref 0.8–1.2)
Prothrombin Time: 50.1 seconds — ABNORMAL HIGH (ref 11.4–15.2)

## 2021-02-06 LAB — CBC
HCT: 30.5 % — ABNORMAL LOW (ref 39.0–52.0)
Hemoglobin: 10.5 g/dL — ABNORMAL LOW (ref 13.0–17.0)
MCH: 31.3 pg (ref 26.0–34.0)
MCHC: 34.4 g/dL (ref 30.0–36.0)
MCV: 91 fL (ref 80.0–100.0)
Platelets: 266 10*3/uL (ref 150–400)
RBC: 3.35 MIL/uL — ABNORMAL LOW (ref 4.22–5.81)
RDW: 15.3 % (ref 11.5–15.5)
WBC: 27.3 10*3/uL — ABNORMAL HIGH (ref 4.0–10.5)
nRBC: 0 % (ref 0.0–0.2)

## 2021-02-06 MED ORDER — WARFARIN - PHARMACIST DOSING INPATIENT: Freq: Every day | Status: DC

## 2021-02-06 MED ORDER — FIDAXOMICIN 200 MG PO TABS
200.0000 mg | ORAL_TABLET | Freq: Two times a day (BID) | ORAL | Status: DC
  Administered 2021-02-06 – 2021-02-12 (×13): 200 mg via ORAL
  Filled 2021-02-06 (×15): qty 1

## 2021-02-06 NOTE — Evaluation (Signed)
Physical Therapy Evaluation Patient Details Name: Anthony Gould MRN: [id number] DOB: [birth year] Today's Date: 02/06/2021  History of Present Illness  87 year old male comes to the hospital with complaints of diarrhea. Dx of c diff.  He was diagnosed recently with acute appendicitis and underwent appendicectomy on 9/12.  He had persistent pain in the right lower quadrant and was readmitted in October with an infected hematoma. with history of chronic kidney disease stage II, HTN, HLD, chronic diastolic CHF, mitral regurgitation status post mechanical MVR on chronic Coumadin, permanent A. fib, pulmonary hypertension, DM2, cirrhosis, melanoma.  Clinical Impression  Pt admitted with above diagnosis. Pt ambulated 54' with RW, no loss of balance, but audible wheezing noted. SaO2 97% on room air with activity, 3/4 dyspnea, B feet edematous, RN stated pt has volume overload and MD is aware.  Pt currently with functional limitations due to the deficits listed below (see PT Problem List). Pt will benefit from skilled PT to increase their independence and safety with mobility to allow discharge to the venue listed below.          Recommendations for follow up therapy are one component of a multi-disciplinary discharge planning process, led by the attending physician.  Recommendations may be updated based on patient status, additional functional criteria and insurance authorization.  Follow Up Recommendations Home health PT    Assistance Recommended at Discharge Set up Supervision/Assistance  Functional Status Assessment Patient has had a recent decline in their functional status and demonstrates the ability to make significant improvements in function in a reasonable and predictable amount of time.  Equipment Recommendations  None recommended by PT    Recommendations for Other Services       Precautions / Restrictions Precautions Precautions: Fall Restrictions Weight Bearing Restrictions: No       Mobility  Bed Mobility Overal bed mobility: Modified Independent             General bed mobility comments: used rail, HOB up, increased time    Transfers Overall transfer level: Needs assistance Equipment used: Rolling walker (2 wheels) Transfers: Sit to/from Stand Sit to Stand: Min assist           General transfer comment: min A to power up, VCs hand placement    Ambulation/Gait Ambulation/Gait assistance: Min guard Gait Distance (Feet): 60 Feet Assistive device: Rolling walker (2 wheels) Gait Pattern/deviations: Step-to pattern Gait velocity: decr     General Gait Details: steady, no loss of balance, audible wheezing noted, 3/4 dyspnea, increased RR, SaO2 97%  Stairs            Wheelchair Mobility    Modified Rankin (Stroke Patients Only)       Balance Overall balance assessment: Modified Independent                                           Pertinent Vitals/Pain Pain Assessment: No/denies pain    Home Living Family/patient expects to be discharged to:: Private residence Living Arrangements: Spouse/significant other Available Help at Discharge: Family;Available 24 hours/day Type of Home: House Home Access: Level entry       Home Layout: Two level;Bed/bath upstairs Home Equipment: Rolling Walker (2 wheels);Cane - single point;Shower seat Additional Comments: does not use DME; also has stair lift if needed    Prior Function Prior Level of Function : Independent/Modified Independent  Hand Dominance   Dominant Hand: Right    Extremity/Trunk Assessment   Upper Extremity Assessment Upper Extremity Assessment: Overall WFL for tasks assessed    Lower Extremity Assessment Lower Extremity Assessment: Generalized weakness;LLE deficits/detail;RLE deficits/detail (B knee ext -4/5) RLE Deficits / Details: B knee ext -4/5, pitting edema noted B feet (RN aware) RLE Sensation: WNL RLE  Coordination: WNL LLE Deficits / Details: knee ext -4/5 LLE Sensation: WNL LLE Coordination: WNL    Cervical / Trunk Assessment Cervical / Trunk Assessment: Normal  Communication   Communication: HOH  Cognition Arousal/Alertness: Awake/alert Behavior During Therapy: WFL for tasks assessed/performed Overall Cognitive Status: Within Functional Limits for tasks assessed                                          General Comments      Exercises     Assessment/Plan    PT Assessment Patient needs continued PT services  PT Problem List Decreased activity tolerance;Decreased mobility       PT Treatment Interventions Gait training;Therapeutic exercise;Therapeutic activities    PT Goals (Current goals can be found in the Care Plan section)  Acute Rehab PT Goals Patient Stated Goal: to get strength back PT Goal Formulation: With patient Time For Goal Achievement: 02/20/21 Potential to Achieve Goals: Good    Frequency Min 3X/week   Barriers to discharge        Co-evaluation               AM-PAC PT "6 Clicks" Mobility  Outcome Measure Help needed turning from your back to your side while in a flat bed without using bedrails?: None Help needed moving from lying on your back to sitting on the side of a flat bed without using bedrails?: A Little Help needed moving to and from a bed to a chair (including a wheelchair)?: A Little Help needed standing up from a chair using your arms (e.g., wheelchair or bedside chair)?: A Little Help needed to walk in hospital room?: A Little Help needed climbing 3-5 steps with a railing? : A Little 6 Click Score: 19    End of Session Equipment Utilized During Treatment: Gait belt Activity Tolerance: Patient limited by fatigue Patient left: in chair;with call bell/phone within reach;with chair alarm set Nurse Communication: Mobility status PT Visit Diagnosis: Difficulty in walking, not elsewhere classified (R26.2)     Time: 2800-3491 PT Time Calculation (min) (ACUTE ONLY): 27 min   Charges:   PT Evaluation $PT Eval Moderate Complexity: 1 Mod PT Treatments $Gait Training: 8-22 mins       Blondell Reveal Kistler PT 02/06/2021  Acute Rehabilitation Services Pager [phone] Office [phone]

## 2021-02-06 NOTE — TOC Benefit Eligibility Note (Signed)
Patient Advocate Encounter  Patient is approved through the [employer] Patient Assistance Program for Dificid through 03/25/2021.   Medication will be mailed to patient's home.   Lyndel Safe, [place] Patient Advocate Specialist [place] Patient Advocate Team Direct Number: [phone]  Fax: [phone]

## 2021-02-06 NOTE — TOC Initial Note (Signed)
Transition of Care [facility]) - Initial/Assessment Note    Patient Details  Name: Anthony Gould MRN: [id number] Date of Birth: Jul 12, 1933  Transition of Care [facility]) CM/SW Contact:    Dessa Phi, RN Phone Number: 02/06/2021, 12:57 PM  Clinical Narrative: [employer] rep angela for HHPT-MD notified for HHPT orders.Noted pharmacy note in reference to [place]. Family has own transport home.                  Expected Discharge Plan: [place] Barriers to Discharge: Continued Medical Work up   Patient Goals and CMS Choice Patient states their goals for this hospitalization and ongoing recovery are:: go home CMS Medicare.gov Compare Post Acute Care list provided to:: Patient Choice offered to / list presented to : Spouse  Expected Discharge Plan and Services Expected Discharge Plan: [place]   Discharge Planning Services: CM Consult   Living arrangements for the past 2 months: Single Family Home                           HH Arranged: PT [facility] Agency: [facility] Date [place]: 02/06/21 Time [place]: 22 Representative spoke with at [place]: [place] Arrangements/Services Living arrangements for the past 2 months: [place] Lives with:: Spouse Patient language and need for interpreter reviewed:: Yes Do you feel safe going back to the place where you live?: Yes      Need for Family Participation in Patient Care: No (Comment)     Criminal Activity/Legal Involvement Pertinent to Current Situation/Hospitalization: No - Comment as needed  Activities of Daily Living Home Assistive Devices/Equipment: None, Other (Comment) (chair lift) ADL Screening (condition at time of admission) Patient's cognitive ability adequate to safely complete daily activities?: No Is the patient deaf or have difficulty hearing?: Yes Does the patient have difficulty seeing, even when wearing glasses/contacts?:  Yes Does the patient have difficulty concentrating, remembering, or making decisions?: No Patient able to express need for assistance with ADLs?: No Does the patient have difficulty dressing or bathing?: No Independently performs ADLs?: No Communication: Needs assistance Is this a change from baseline?: Pre-admission baseline Dressing (OT): Needs assistance Is this a change from baseline?: Pre-admission baseline Grooming: Needs assistance Is this a change from baseline?: Pre-admission baseline Feeding: Independent Bathing: Needs assistance Is this a change from baseline?: Pre-admission baseline Toileting: Needs assistance Is this a change from baseline?: Pre-admission baseline In/Out Bed: Needs assistance Is this a change from baseline?: Pre-admission baseline Walks in Home: Needs assistance Is this a change from baseline?: Pre-admission baseline Does the patient have difficulty walking or climbing stairs?: Yes Weakness of Legs: Both Weakness of Arms/Hands: Both  Permission Sought/Granted Permission sought to share information with : Case Manager Permission granted to share information with : Yes, Verbal Permission Granted  Share Information with NAME: Case Manager     Permission granted to share info w Relationship: [name] spouse [phone]     Emotional Assessment Appearance:: Appears stated age Attitude/Demeanor/Rapport: Gracious Affect (typically observed): Accepting Orientation: : Oriented to Self, Oriented to Place, Oriented to Situation, Oriented to  Time Alcohol / Substance Use: Not Applicable Psych Involvement: No (comment)  Admission diagnosis:  Diarrhea [R19.7] Dehydration [E86.0] Hypokalemia [E87.6] AKI (acute kidney injury) ([place]) [N17.9] Diarrhea, unspecified type [R19.7] Sepsis, due to unspecified organism, unspecified whether acute organ dysfunction present [facility]) [A41.9] Patient Active Problem List   Diagnosis  Date Noted   Sepsis ([place]) 02/04/2021    Appendiceal abscess 01/10/2021   Hyponatremia 12/30/2020   Hypokalemia 12/30/2020   Iron deficiency anemia 12/30/2020   Postoperative infected hematoma due to E. coli 12/29/2020   Supratherapeutic INR 12/29/2020   Anemia 12/28/2020   H/O mitral valve replacement with mechanical valve    Acute appendicitis 12/03/2020   Insomnia 09/27/2020   Grief at loss of child 09/01/2020   Right hip pain 08/10/2020   Elevated TSH 07/07/2020   CRI (chronic renal insufficiency), stage 3 (moderate) ([place]) 04/08/2020   Neoplasm of uncertain behavior of skin 10/01/2019   Hypomagnesemia 04/02/2019   Syncope 03/05/2019   Abdominal pain 01/20/2019   Vitamin D deficiency 11/28/2018   Low back pain 11/26/2018   Skin cancer 11/26/2018   Melanoma in situ ([place]) 08/26/2018   Foot lesion 04/23/2018   Hematuria 12/18/2017   Night sweats 12/18/2017   Chronic diastolic heart failure ([place]) 09/16/2016   Food allergy 02/13/2016   Cirrhosis of liver without ascites ([place]) 10/12/2015   Nausea with vomiting 10/12/2015   Emesis 09/20/2015   Aspiration pneumonia ([place]) 09/03/2015   Food poisoning 09/02/2015   Hematemesis 09/02/2015   Cough 09/02/2015   Flank pain, acute 05/02/2015   Actinic keratoses 10/20/2014   NSVT (nonsustained ventricular tachycardia) ([place]) 02/16/2013   Routine health maintenance 02/06/2012   Paroxysmal atrial fibrillation ([place]) 09/06/2011   Long term (current) use of anticoagulants 07/04/2010   Gout 06/27/2010   Mitral valve disorder 06/06/2010   Chest pain 02/28/2010   TOXIC LABYRINTHITIS 08/04/2009   Hearing loss 07/07/2009   DYSPEPSIA, CHRONIC 06/23/2007   DM2 (diabetes mellitus, type 2) ([place]) 02/08/2007   Hyperlipidemia 01/31/2007   Essential hypertension 01/31/2007   Coronary atherosclerosis 01/31/2007   PAF (paroxysmal atrial fibrillation) ([place]) 01/31/2007   Cerebral artery occlusion with cerebral infarction ([place]) 01/31/2007   Transient cerebral ischemia 01/31/2007   MITRAL  VALVE REPLACEMENT, HX OF 01/31/2007   PCP:  Cassandria Anger, MD Pharmacy:   [facility], [place] [address] AT [place] [address] [place] [address] Phone: [phone] Fax: [phone]  [facility]MAIL ORDER) [place], [place] [address] Phone: [phone] Fax: [phone]  [id number] - [address] [place] AT [facility] [place] [address] Phone: [phone] Fax: [phone]  [facility] ([facility]) [place], [place] [address] Phone: [phone] Fax: [phone]  [facility] [address] [place] [address] Phone: [phone] Fax: [place] [address] Phone: [phone] Fax: [phone]     Social Determinants of Health (SDOH) Interventions    Readmission Risk Interventions No flowsheet data found.

## 2021-02-06 NOTE — Progress Notes (Signed)
PROGRESS NOTE  Anthony Gould IRW:[id number] DOB: Jul 31, 1933 DOA: 02/04/2021 PCP: Cassandria Anger, MD   LOS: 2 days   Brief Narrative / Interim history: Pleasant 87 year old male with history of chronic kidney disease stage II, HTN, HLD, chronic diastolic CHF, mitral regurgitation status post mechanical MVR on chronic Coumadin, permanent A. fib, pulmonary hypertension, DM2, cirrhosis, melanoma comes to the hospital with complaints of diarrhea.  He was diagnosed recently with acute appendicitis and underwent appendicectomy on 9/12.  He had persistent pain in the right lower quadrant and was readmitted in October with an infected hematoma.  He had an abscess in that area which appeared to be infected.  He had a drain placed, grew E. coli and was treated with antibiotics.  Drain was removed recently.  Ever since his second hospitalization he has been having intermittent diarrhea but recently this is gotten much worse.  He also has been having poor appetite and weakness.  Subjective / 24h Interval events: Appreciates some improvement today in his general condition.  Complains of a cough.  Assessment & Plan: Principal problem Severe sepsis due to C. difficile colitis-this is been going on for several weeks, on admission he was extremely dehydrated, hyponatremic, and had significant leukocytosis with WBC 27.  He was febrile and lactic acid was elevated. -C. difficile testing returned positive.  He was initially placed on oral vancomycin and IV metronidazole, however after discussion with pharmacy, they feel like this is interfering with his Coumadin, and today he was switched to fidaxomicin   Active problems Hyponatremia-overall stable, stop fluids as he is eating well and I am afraid he will become fluid overloaded, continue to monitor  Hypokalemia, hypomagnesemia -potassium normalized after repletion   Mild normal anion gap metabolic acidosis -Likely due to diarrhea and home diuretic use.   Stop fluids but continue to hold diuretics   AKI on CKD stage II -Likely prerenal azotemia from dehydration. BUN 25, creatinine 1.6 (baseline 1.0).  Renal function is now stable, creatinine around 1.3   Pleural effusions -CT showing moderate right and small left pleural effusions lying dependently in the areas of passive subsegmental atelectasis in the lower lobes bilaterally.  Patient is not hypoxic at present and has not complained of any shortness of breath.  Perhaps will need thoracentesis at one-point but currently INR is supratherapeutic.  Continue to monitor   Chronic diastolic heart failure -stop fluids today   Mechanical mitral valve -INR currently supratherapeutic, increasing even more from 4.8-5.6   Permanent A. Fib -Currently rate controlled.   Diet controlled type 2 diabetes -A1c 4.9   Hypertension -Stable. Continue bisoprolol   Hyperlipidemia -Continue Lipitor   Gout -Continue allopurinol   GERD -stop Protonix given C. Difficile  Scheduled Meds:  allopurinol  300 mg Oral Daily   atorvastatin  20 mg Oral Once per day on Mon Fri   bisoprolol  2.5 mg Oral QHS   fidaxomicin  200 mg Oral BID   latanoprost  1 drop Both Eyes QHS   Continuous Infusions:   PRN Meds:.acetaminophen **OR** acetaminophen  Diet Orders (From admission, onward)     Start     Ordered   02/04/21 0748  Diet full liquid Room service appropriate? Yes; Fluid consistency: Thin  Diet effective now       Question Answer Comment  Room service appropriate? Yes   Fluid consistency: Thin      02/04/21 0747            DVT prophylaxis:  Code Status: DNR  Family Communication: Discussed with wife at bedside  Status is: Inpatient  Remains inpatient appropriate because: Persistent symptoms, persistent electrolyte abnormalities  Level of care: Telemetry  Consultants:  None  Procedures:  none  Microbiology  C. difficile-positive  Antimicrobials: P.o. vancomycin, IV  metronidazole 11/12   Objective: Vitals:   02/05/21 1321 02/05/21 2123 02/05/21 2224 02/06/21 0503  BP: 118/90 (!) 117/53  111/60  Pulse: 88 86  78  Resp: 20   20  Temp: 98.4 F (36.9 C) 98.2 F (36.8 C)  98 F (36.7 C)  TempSrc: Oral Oral  Oral  SpO2: 96% 96%  97%  Weight:   79.2 kg   Height:   5\' 9"  (1.753 m)     Intake/Output Summary (Last 24 hours) at 02/06/2021 1041 Last data filed at 02/06/2021 0200 Gross per 24 hour  Intake 1632.36 ml  Output --  Net 1632.36 ml    Filed Weights   02/05/21 2224  Weight: 79.2 kg    Examination:  Constitutional: No distress Eyes: Anicteric ENMT: Moist mucous membranes Neck: normal, supple Respiratory: Clear bilaterally, no wheezing, normal respiratory effort.  Occasional rhonchi at the bases clearing with coughing Cardiovascular: Regular rate and rhythm, no murmurs, no edema Abdomen: Soft, nontender, nondistended, bowel sounds positive Musculoskeletal: no clubbing / cyanosis.  Skin: No rashes seen Neurologic: Nonfocal  Data Reviewed: I have independently reviewed following labs and imaging studies   CBC: Recent Labs  Lab 02/03/21 1623 02/04/21 0222 02/05/21 0503 02/06/21 0432  WBC 27.0* 21.7* 25.2* 27.3*  NEUTROABS 24,651* 20.0*  --   --   HGB 11.2* 10.2* 10.5* 10.5*  HCT 35.1* 29.6* 31.0* 30.5*  MCV 94.9 90.8 92.0 91.0  PLT 304 232 268 287    Basic Metabolic Panel: Recent Labs  Lab 02/04/21 0222 02/04/21 0348 02/04/21 0927 02/04/21 1632 02/05/21 0503 02/06/21 0432  NA 122*  --  124* 123* 125* 124*  K 2.6* 2.6* 2.8* 3.8 5.3* 4.8  CL 92*  --  94* 97* 99 97*  CO2 21*  --  23 20* 18* 17*  GLUCOSE 132*  --  117* 134* 105* 91  BUN 25*  --  24* 24* 24* 30*  CREATININE 1.63*  --  1.45* 1.30* 1.38* 1.31*  CALCIUM 7.7*  --  7.6* 7.4* 7.8* 7.9*  MG  --  1.4*  --  1.6*  --   --     Liver Function Tests: Recent Labs  Lab 02/03/21 1623 02/04/21 0222 02/06/21 0432  AST 22 24 21   ALT 7* 10 10  ALKPHOS  --   71 73  BILITOT 1.1 1.3* 0.9  PROT 5.7* 5.8* 4.7*  ALBUMIN  --  2.9* 2.2*    Coagulation Profile: Recent Labs  Lab 02/01/21 1532 02/04/21 0222 02/05/21 0503 02/06/21 0432  INR 4.8* 4.8* 4.8* 5.5*    HbA1C: Recent Labs    02/04/21 0927  HGBA1C 4.9    CBG: No results for input(s): GLUCAP in the last 168 hours.  Recent Results (from the past 240 hour(s))  Blood Culture (routine x 2)     Status: None (Preliminary result)   Collection Time: 02/04/21  2:22 AM   Specimen: BLOOD  Result Value Ref Range Status   Specimen Description   Final    BLOOD RIGHT ANTECUBITAL Performed at [facility] [address] [place], [place] [address]    Special Requests   Final    BOTTLES DRAWN AEROBIC AND  ANAEROBIC Blood Culture results may not be optimal due to an excessive volume of blood received in culture bottles Performed at [facility] [address] [place], [place] [address]    Culture   Final    NO GROWTH 2 DAYS Performed at [facility] [address] [place], [place] [address]    Report Status PENDING  Incomplete  Urine Culture     Status: None (Preliminary result)   Collection Time: 02/04/21  2:29 AM   Specimen: In/Out Cath Urine  Result Value Ref Range Status   Specimen Description   Final    IN/OUT CATH URINE Performed at [facility] [address] [place], [place] [address]    Special Requests   Final    Normal Performed at [facility], [place] [address], [place], [place] [address]    Culture   Final    CULTURE REINCUBATED FOR BETTER GROWTH Performed at [facility], [place] [address], [place], [place] [address]    Report Status PENDING  Incomplete  Resp Panel by RT-PCR (Flu A&B, Covid) Nasopharyngeal Swab     Status: None   Collection Time: 02/04/21  2:52 AM   Specimen: Nasopharyngeal Swab; Nasopharyngeal(NP) swabs in vial transport medium  Result Value Ref Range Status    SARS Coronavirus 2 by RT PCR NEGATIVE NEGATIVE Final    Comment: (NOTE) SARS-CoV-2 target nucleic acids are NOT DETECTED.  The SARS-CoV-2 RNA is generally detectable in upper respiratory specimens during the acute phase of infection. The lowest concentration of SARS-CoV-2 viral copies this assay can detect is 138 copies/mL. A negative result does not preclude SARS-Cov-2 infection and should not be used as the sole basis for treatment or other patient management decisions. A negative result may occur with  improper specimen collection/handling, submission of specimen other than nasopharyngeal swab, presence of viral mutation(s) within the areas targeted by this assay, and inadequate number of viral copies(<138 copies/mL). A negative result must be combined with clinical observations, patient history, and epidemiological information. The expected result is Negative.  Fact Sheet for Patients:  EntrepreneurPulse.com.au  Fact Sheet for Healthcare Providers:  IncredibleEmployment.be  This test is no t yet approved or cleared by the [place] FDA and  has been authorized for detection and/or diagnosis of SARS-CoV-2 by FDA under an Emergency Use Authorization (EUA). This EUA will remain  in effect (meaning this test can be used) for the duration of the COVID-19 declaration under Section 564(b)(1) of the Act, 21 U.S.C.section 360bbb-3(b)(1), unless the authorization is terminated  or revoked sooner.       Influenza A by PCR NEGATIVE NEGATIVE Final   Influenza B by PCR NEGATIVE NEGATIVE Final    Comment: (NOTE) The Xpert Xpress SARS-CoV-2/FLU/RSV plus assay is intended as an aid in the diagnosis of influenza from Nasopharyngeal swab specimens and should not be used as a sole basis for treatment. Nasal washings and aspirates are unacceptable for Xpert Xpress SARS-CoV-2/FLU/RSV testing.  Fact Sheet for  Patients: EntrepreneurPulse.com.au  Fact Sheet for Healthcare Providers: IncredibleEmployment.be  This test is not yet approved or cleared by the [place] FDA and has been authorized for detection and/or diagnosis of SARS-CoV-2 by FDA under an Emergency Use Authorization (EUA). This EUA will remain in effect (meaning this test can be used) for the duration of the COVID-19 declaration under Section 564(b)(1) of the Act, 21 U.S.C. section 360bbb-3(b)(1), unless the authorization is terminated or revoked.  Performed at  [facility], [place] [address] [place], [place] [address]   Blood Culture (routine x 2)     Status: None (Preliminary result)   Collection Time: 02/04/21  2:57 AM   Specimen: BLOOD  Result Value Ref Range Status   Specimen Description   Final    BLOOD BLOOD RIGHT HAND Performed at [facility] [address] [place], [place] [address]    Special Requests   Final    BOTTLES DRAWN AEROBIC AND ANAEROBIC Blood Culture adequate volume Performed at [facility] [address], [place], [place] [address]    Culture   Final    NO GROWTH 2 DAYS Performed at [facility] [address] [place], [place] [address]    Report Status PENDING  Incomplete  C Difficile Quick Screen w PCR reflex     Status: Abnormal   Collection Time: 02/04/21  4:04 AM   Specimen: Stool  Result Value Ref Range Status   C Diff antigen POSITIVE (A) NEGATIVE Final   C Diff toxin POSITIVE (A) NEGATIVE Final   C Diff interpretation Toxin producing C. difficile detected.  Final    Comment: CRITICAL RESULT CALLED TO, READ BACK BY AND VERIFIED WITH: Valma Cava RN ON 02/04/2021 @ 1696 BY GOLSONM Performed at [facility] [address], [place], [place] [address]       Radiology Studies: No results found.   Marzetta Board, MD, PhD [facility]  Between 7 am - 7 pm  I am available, please contact me via Amion (for emergencies) or Securechat (non urgent messages)  Between 7 pm - 7 am I am not available, please contact night coverage MD/APP via Amion

## 2021-02-06 NOTE — Progress Notes (Signed)
[place] for Warfarin Indication: Afib and MVR  Allergies  Allergen Reactions   Diltiazem Other (See Comments)    "Bradycardia," per pt; tolerates Amlodipine   Sulfa Antibiotics Rash and Other (See Comments)    Reaction not fully recalled    Sulfonamide Derivatives Rash and Other (See Comments)    Reaction not fully recalled     Patient Measurements: Height: 5\' 9"  (175.3 cm) Weight: 79.2 kg (174 lb 9.7 oz) IBW/kg (Calculated) : 70.7 Heparin Dosing Weight: 74.5 kg  Vital Signs: Temp: 97.5 F (36.4 C) (11/14 1117) Temp Source: Oral (11/14 1117) BP: 123/64 (11/14 1117) Pulse Rate: 95 (11/14 1117)  Labs: Recent Labs    02/04/21 0222 02/04/21 0927 02/04/21 1632 02/05/21 0503 02/06/21 0432  HGB 10.2*  --   --  10.5* 10.5*  HCT 29.6*  --   --  31.0* 30.5*  PLT 232  --   --  268 266  APTT 63*  --   --   --   --   LABPROT 44.6*  --   --  45.1* 50.1*  INR 4.8*  --   --  4.8* 5.5*  CREATININE 1.63*   < > 1.30* 1.38* 1.31*   < > = values in this interval not displayed.     Estimated Creatinine Clearance: 41.2 mL/min (A) (by C-G formula based on SCr of 1.31 mg/dL (H)).   Medications:  Scheduled:   allopurinol  300 mg Oral Daily   atorvastatin  20 mg Oral Once per day on Mon Fri   bisoprolol  2.5 mg Oral QHS   fidaxomicin  200 mg Oral BID   latanoprost  1 drop Both Eyes QHS    Assessment: Admit for severe Cdiff colitis, started on Vancomycin po as outpatient. On Warfarin PTA for Afib & MVR - 5mg  daily, exc 2.5mg  MF, with last dose 11/11.  - 11/12 INR 4.8 - 11/13 INR 4.8 - 11/14 INR 5.5  Today, 02/06/2021: CBC: Hgb low but stable; Plt stable WNL No bleeding issues per nursing INR remains SUPRAtherapeutic despite holding warfarin due to drug interaction with Flagyl Flagyl d/c due to interaction with warfarin  Started Dificid for Cdiff tx  Goal of Therapy:  INR goal 3-3.5 per outpatient clinic note Monitor platelets by  anticoagulation protocol: Yes  Plan: Hold warfarin again today Daily INR CBC at least q72 hr while on warfarin Monitor for signs of bleeding or thrombosis  Rheana Casebolt Aldona Lento, Student-PharmD

## 2021-02-06 NOTE — Plan of Care (Signed)
  Problem: Health Behavior/Discharge Planning: Goal: Ability to manage health-related needs will improve Outcome: Progressing   

## 2021-02-07 LAB — COMPREHENSIVE METABOLIC PANEL
ALT: 10 U/L (ref 0–44)
AST: 22 U/L (ref 15–41)
Albumin: 2.2 g/dL — ABNORMAL LOW (ref 3.5–5.0)
Alkaline Phosphatase: 159 U/L — ABNORMAL HIGH (ref 38–126)
Anion gap: 7 (ref 5–15)
BUN: 32 mg/dL — ABNORMAL HIGH (ref 8–23)
CO2: 19 mmol/L — ABNORMAL LOW (ref 22–32)
Calcium: 8 mg/dL — ABNORMAL LOW (ref 8.9–10.3)
Chloride: 98 mmol/L (ref 98–111)
Creatinine, Ser: 1.23 mg/dL (ref 0.61–1.24)
GFR, Estimated: 58 mL/min — ABNORMAL LOW (ref >60)
Glucose, Bld: 79 mg/dL (ref 70–99)
Potassium: 4.5 mmol/L (ref 3.5–5.1)
Sodium: 124 mmol/L — ABNORMAL LOW (ref 135–145)
Total Bilirubin: 1 mg/dL (ref 0.3–1.2)
Total Protein: 5 g/dL — ABNORMAL LOW (ref 6.5–8.1)

## 2021-02-07 LAB — CBC
HCT: 31.5 % — ABNORMAL LOW (ref 39.0–52.0)
Hemoglobin: 10.7 g/dL — ABNORMAL LOW (ref 13.0–17.0)
MCH: 31.2 pg (ref 26.0–34.0)
MCHC: 34 g/dL (ref 30.0–36.0)
MCV: 91.8 fL (ref 80.0–100.0)
Platelets: 290 10*3/uL (ref 150–400)
RBC: 3.43 MIL/uL — ABNORMAL LOW (ref 4.22–5.81)
RDW: 15.4 % (ref 11.5–15.5)
WBC: 23.2 10*3/uL — ABNORMAL HIGH (ref 4.0–10.5)
nRBC: 0 % (ref 0.0–0.2)

## 2021-02-07 LAB — URINE CULTURE
Culture: 20000 — AB
Special Requests: NORMAL

## 2021-02-07 LAB — PROTIME-INR
INR: 5.6 (ref 0.8–1.2)
Prothrombin Time: 50.7 seconds — ABNORMAL HIGH (ref 11.4–15.2)

## 2021-02-07 MED ORDER — SODIUM CHLORIDE 1 G PO TABS
1.0000 g | ORAL_TABLET | Freq: Three times a day (TID) | ORAL | Status: DC
  Administered 2021-02-07 – 2021-02-12 (×16): 1 g via ORAL
  Filled 2021-02-07 (×17): qty 1

## 2021-02-07 NOTE — Progress Notes (Signed)
PROGRESS NOTE  Anthony Gould UTM:[id number] DOB: Mar 10, 1934 DOA: 02/04/2021 PCP: Cassandria Anger, MD   LOS: 3 days   Brief Narrative / Interim history: Pleasant 87 year old male with history of chronic kidney disease stage II, HTN, HLD, chronic diastolic CHF, mitral regurgitation status post mechanical MVR on chronic Coumadin, permanent A. fib, pulmonary hypertension, DM2, cirrhosis, melanoma comes to the hospital with complaints of diarrhea.  He was diagnosed recently with acute appendicitis and underwent appendicectomy on 9/12.  He had persistent pain in the right lower quadrant and was readmitted in October with an infected hematoma.  He had an abscess in that area which appeared to be infected.  He had a drain placed, grew E. coli and was treated with antibiotics.  Drain was removed recently.  Ever since his second hospitalization he has been having intermittent diarrhea but recently this is gotten much worse.  He also has been having poor appetite and weakness.  Subjective / 24h Interval events: Feels better.  Diarrhea is slowing down.  Assessment & Plan: Principal problem Severe sepsis due to C. difficile colitis-this is been going on for several weeks, on admission he was extremely dehydrated, hyponatremic, and had significant leukocytosis with WBC 27.  He was febrile and lactic acid was elevated. -C. difficile testing returned positive.  He was initially placed on oral vancomycin and IV metronidazole, however after discussion with pharmacy, they feel like this is interfering with his Coumadin, and he was switched to fidaxomicin on 11/14.  He was approved for patient's assistance and will receive this medication at home as well   Active problems Hyponatremia-overall stable, likely a combination of intravascular depletion and poor solute intake, he appears dehydrated and has received fluids, however yesterday he had increased cough and felt to be fluid overloaded and fluid had to be  stopped.  He has poor p.o. intake, will place on salt tabs  Hypokalemia, hypomagnesemia -potassium normalized after repletion   Mild normal anion gap metabolic acidosis -Likely due to diarrhea and home diuretic use.  Stop fluids but continue to hold diuretics   AKI on CKD stage II -Likely prerenal azotemia from dehydration. BUN 25, creatinine 1.6 (baseline 1.0).  Renal function is now stable, creatinine 1.2 this morning   Pleural effusions -CT showing moderate right and small left pleural effusions lying dependently in the areas of passive subsegmental atelectasis in the lower lobes bilaterally.  Patient is not hypoxic at present and has not complained of any shortness of breath no that the fluids were stopped.  We will likely do a thoracentesis prior to discharge once INR better   Chronic diastolic heart failure -monitor off fluids now given mild fluid overload yesterday   Mechanical mitral valve -INR currently supratherapeutic, increasing even more from 4.8 >>5.6.  No bleeding, monitor   Permanent A. Fib -Currently rate controlled.   Diet controlled type 2 diabetes -A1c 4.9   Hypertension -Stable. Continue bisoprolol   Hyperlipidemia -Continue Lipitor   Gout -Continue allopurinol   GERD -stop Protonix given C. Difficile  Scheduled Meds:  allopurinol  300 mg Oral Daily   atorvastatin  20 mg Oral Once per day on Mon Fri   bisoprolol  2.5 mg Oral QHS   fidaxomicin  200 mg Oral BID   latanoprost  1 drop Both Eyes QHS   Warfarin - Pharmacist Dosing Inpatient   Does not apply q1600   Continuous Infusions:   PRN Meds:.acetaminophen **OR** acetaminophen  Diet Orders (From admission, onward)  Start     Ordered   02/04/21 0748  Diet full liquid Room service appropriate? Yes; Fluid consistency: Thin  Diet effective now       Question Answer Comment  Room service appropriate? Yes   Fluid consistency: Thin      02/04/21 0747            DVT prophylaxis:      Code  Status: DNR  Family Communication: Discussed with wife at bedside  Status is: Inpatient  Remains inpatient appropriate because: Persistent symptoms, persistent electrolyte abnormalities  Level of care: Telemetry  Consultants:  None  Procedures:  none  Microbiology  C. difficile-positive  Antimicrobials: P.o. vancomycin, IV metronidazole 11/12   Objective: Vitals:   02/06/21 0503 02/06/21 1117 02/06/21 1956 02/07/21 0420  BP: 111/60 123/64 119/61 (!) 110/54  Pulse: 78 95 78 80  Resp: 20 20 15 18   Temp: 98 F (36.7 C) (!) 97.5 F (36.4 C) 98.2 F (36.8 C) 98.3 F (36.8 C)  TempSrc: Oral Oral Oral Oral  SpO2: 97% 98% 97% 97%  Weight:      Height:        Intake/Output Summary (Last 24 hours) at 02/07/2021 1055 Last data filed at 02/07/2021 0410 Gross per 24 hour  Intake 360 ml  Output 600 ml  Net -240 ml    Filed Weights   02/05/21 2224  Weight: 79.2 kg    Examination:  Constitutional: NAD Eyes: Anicteric ENMT: Moist mucous membranes Neck: normal, supple Respiratory: Clear bilaterally, no wheezing, normal respiratory effort.  Diminished at the bases Cardiovascular: Regular rate and rhythm, no murmurs, no peripheral edema Abdomen: Soft, NT, ND, bowel sounds positive Musculoskeletal: no clubbing / cyanosis.  Skin: No rashes appreciated Neurologic: No focal deficits  Data Reviewed: I have independently reviewed following labs and imaging studies   CBC: Recent Labs  Lab 02/03/21 1623 02/04/21 0222 02/05/21 0503 02/06/21 0432 02/07/21 1010  WBC 27.0* 21.7* 25.2* 27.3* 23.2*  NEUTROABS 24,651* 20.0*  --   --   --   HGB 11.2* 10.2* 10.5* 10.5* 10.7*  HCT 35.1* 29.6* 31.0* 30.5* 31.5*  MCV 94.9 90.8 92.0 91.0 91.8  PLT 304 232 268 266 732    Basic Metabolic Panel: Recent Labs  Lab 02/04/21 0348 02/04/21 0927 02/04/21 1632 02/05/21 0503 02/06/21 0432 02/07/21 1010  NA  --  124* 123* 125* 124* 124*  K 2.6* 2.8* 3.8 5.3* 4.8 4.5  CL  --   94* 97* 99 97* 98  CO2  --  23 20* 18* 17* 19*  GLUCOSE  --  117* 134* 105* 91 79  BUN  --  24* 24* 24* 30* 32*  CREATININE  --  1.45* 1.30* 1.38* 1.31* 1.23  CALCIUM  --  7.6* 7.4* 7.8* 7.9* 8.0*  MG 1.4*  --  1.6*  --   --   --     Liver Function Tests: Recent Labs  Lab 02/03/21 1623 02/04/21 0222 02/06/21 0432 02/07/21 1010  AST 22 24 21 22   ALT 7* 10 10 10   ALKPHOS  --  71 73 159*  BILITOT 1.1 1.3* 0.9 1.0  PROT 5.7* 5.8* 4.7* 5.0*  ALBUMIN  --  2.9* 2.2* 2.2*    Coagulation Profile: Recent Labs  Lab 02/01/21 1532 02/04/21 0222 02/05/21 0503 02/06/21 0432 02/07/21 0436  INR 4.8* 4.8* 4.8* 5.5* 5.6*    HbA1C: No results for input(s): HGBA1C in the last 72 hours.  CBG: No results for input(s):  GLUCAP in the last 168 hours.  Recent Results (from the past 240 hour(s))  Blood Culture (routine x 2)     Status: None (Preliminary result)   Collection Time: 02/04/21  2:22 AM   Specimen: BLOOD  Result Value Ref Range Status   Specimen Description   Final    BLOOD RIGHT ANTECUBITAL Performed at [facility] [address] [place], [place] [address]    Special Requests   Final    BOTTLES DRAWN AEROBIC AND ANAEROBIC Blood Culture results may not be optimal due to an excessive volume of blood received in culture bottles Performed at [facility] [address] [place], [place] [address]    Culture   Final    NO GROWTH 2 DAYS Performed at [facility] [address], [place], [place] [address]    Report Status PENDING  Incomplete  Urine Culture     Status: Abnormal   Collection Time: 02/04/21  2:29 AM   Specimen: In/Out Cath Urine  Result Value Ref Range Status   Specimen Description   Final    IN/OUT CATH URINE Performed at [facility] [address] [place], [place] [address]    Special Requests   Final    Normal Performed at [facility], [place] [address]  [place], [place] [address]    Culture (A)  Final    20,000 COLONIES/mL ENTEROCOCCUS FAECIUM 2,000 COLONIES/mL ENTEROCOCCUS FAECALIS    Report Status 02/07/2021 FINAL  Final   Organism ID, Bacteria ENTEROCOCCUS FAECIUM (A)  Final   Organism ID, Bacteria ENTEROCOCCUS FAECALIS (A)  Final      Susceptibility   Enterococcus faecalis - MIC*    AMPICILLIN <=2 SENSITIVE Sensitive     NITROFURANTOIN <=16 SENSITIVE Sensitive     VANCOMYCIN 1 SENSITIVE Sensitive     * 2,000 COLONIES/mL ENTEROCOCCUS FAECALIS   Enterococcus faecium - MIC*    AMPICILLIN RESISTANT Resistant     NITROFURANTOIN 64 INTERMEDIATE Intermediate     VANCOMYCIN <=0.5 SENSITIVE Sensitive     * 20,000 COLONIES/mL ENTEROCOCCUS FAECIUM  Resp Panel by RT-PCR (Flu A&B, Covid) Nasopharyngeal Swab     Status: None   Collection Time: 02/04/21  2:52 AM   Specimen: Nasopharyngeal Swab; Nasopharyngeal(NP) swabs in vial transport medium  Result Value Ref Range Status   SARS Coronavirus 2 by RT PCR NEGATIVE NEGATIVE Final    Comment: (NOTE) SARS-CoV-2 target nucleic acids are NOT DETECTED.  The SARS-CoV-2 RNA is generally detectable in upper respiratory specimens during the acute phase of infection. The lowest concentration of SARS-CoV-2 viral copies this assay can detect is 138 copies/mL. A negative result does not preclude SARS-Cov-2 infection and should not be used as the sole basis for treatment or other patient management decisions. A negative result may occur with  improper specimen collection/handling, submission of specimen other than nasopharyngeal swab, presence of viral mutation(s) within the areas targeted by this assay, and inadequate number of viral copies(<138 copies/mL). A negative result must be combined with clinical observations, patient history, and epidemiological information. The expected result is Negative.  Fact Sheet for Patients:  EntrepreneurPulse.com.au  Fact Sheet for Healthcare  Providers:  IncredibleEmployment.be  This test is no t yet approved or cleared by the [place] FDA and  has been authorized for detection and/or diagnosis of SARS-CoV-2 by FDA under an Emergency Use Authorization (EUA). This EUA will remain  in effect (meaning this test can be used) for  the duration of the COVID-19 declaration under Section 564(b)(1) of the Act, 21 U.S.C.section 360bbb-3(b)(1), unless the authorization is terminated  or revoked sooner.       Influenza A by PCR NEGATIVE NEGATIVE Final   Influenza B by PCR NEGATIVE NEGATIVE Final    Comment: (NOTE) The Xpert Xpress SARS-CoV-2/FLU/RSV plus assay is intended as an aid in the diagnosis of influenza from Nasopharyngeal swab specimens and should not be used as a sole basis for treatment. Nasal washings and aspirates are unacceptable for Xpert Xpress SARS-CoV-2/FLU/RSV testing.  Fact Sheet for Patients: EntrepreneurPulse.com.au  Fact Sheet for Healthcare Providers: IncredibleEmployment.be  This test is not yet approved or cleared by the [place] FDA and has been authorized for detection and/or diagnosis of SARS-CoV-2 by FDA under an Emergency Use Authorization (EUA). This EUA will remain in effect (meaning this test can be used) for the duration of the COVID-19 declaration under Section 564(b)(1) of the Act, 21 U.S.C. section 360bbb-3(b)(1), unless the authorization is terminated or revoked.  Performed at [facility] [place] [address], [place], [place] [address]   Blood Culture (routine x 2)     Status: None (Preliminary result)   Collection Time: 02/04/21  2:57 AM   Specimen: BLOOD  Result Value Ref Range Status   Specimen Description   Final    BLOOD BLOOD RIGHT HAND Performed at [facility] [address], [place], [place] [address]    Special Requests   Final    BOTTLES DRAWN AEROBIC AND ANAEROBIC Blood  Culture adequate volume Performed at [facility] [address], [place], [place] [address]    Culture   Final    NO GROWTH 2 DAYS Performed at [facility] [address] [place], [place] [address]    Report Status PENDING  Incomplete  C Difficile Quick Screen w PCR reflex     Status: Abnormal   Collection Time: 02/04/21  4:04 AM   Specimen: Stool  Result Value Ref Range Status   C Diff antigen POSITIVE (A) NEGATIVE Final   C Diff toxin POSITIVE (A) NEGATIVE Final   C Diff interpretation Toxin producing C. difficile detected.  Final    Comment: CRITICAL RESULT CALLED TO, READ BACK BY AND VERIFIED WITH: Valma Cava RN ON 02/04/2021 @ 1245 BY GOLSONM Performed at [facility] [address] [place], [place] [address]       Radiology Studies: No results found.   Marzetta Board, MD, PhD [facility]  Between 7 am - 7 pm I am available, please contact me via Amion (for emergencies) or Securechat (non urgent messages)  Between 7 pm - 7 am I am not available, please contact night coverage MD/APP via Amion

## 2021-02-07 NOTE — Progress Notes (Signed)
[facility] for Warfarin Indication: Afib and MVR  Allergies  Allergen Reactions   Diltiazem Other (See Comments)    "Bradycardia," per pt; tolerates Amlodipine   Sulfa Antibiotics Rash and Other (See Comments)    Reaction not fully recalled    Sulfonamide Derivatives Rash and Other (See Comments)    Reaction not fully recalled     Patient Measurements: Height: 5\' 9"  (175.3 cm) Weight: 79.2 kg (174 lb 9.7 oz) IBW/kg (Calculated) : 70.7 Heparin Dosing Weight: 74.5 kg  Vital Signs: Temp: 98.3 F (36.8 C) (11/15 0420) Temp Source: Oral (11/15 0420) BP: 110/54 (11/15 0420) Pulse Rate: 80 (11/15 0420)  Labs: Recent Labs    02/05/21 0503 02/06/21 0432 02/07/21 0436 02/07/21 1010  HGB 10.5* 10.5*  --  10.7*  HCT 31.0* 30.5*  --  31.5*  PLT 268 266  --  290  LABPROT 45.1* 50.1* 50.7*  --   INR 4.8* 5.5* 5.6*  --   CREATININE 1.38* 1.31*  --  1.23    Estimated Creatinine Clearance: 43.9 mL/min (by C-G formula based on SCr of 1.23 mg/dL).   Medications:  Scheduled:   allopurinol  300 mg Oral Daily   atorvastatin  20 mg Oral Once per day on Mon Fri   bisoprolol  2.5 mg Oral QHS   fidaxomicin  200 mg Oral BID   latanoprost  1 drop Both Eyes QHS   sodium chloride  1 g Oral TID WC   Warfarin - Pharmacist Dosing Inpatient   Does not apply q1600    Assessment: Admit for severe Cdiff colitis, started on Vancomycin po as outpatient. On Warfarin PTA for Afib & MVR - 5 mg daily, except 2.5 mg MF, with last dose 11/11.  - 11/12 INR 4.8 - 11/13 INR 4.8 - 11/14 INR 5.5 - 11/15 INR 5.6  Today, 02/07/2021: CBC: Hgb low but stable; Plt stable WNL No bleeding issues reported INR remains SUPRAtherapeutic despite holding warfarin due to drug interaction with Flagyl Flagyl d/c 11/14 due to interaction with warfarin  Started Dificid 11/14 for Cdiff tx  Goal of Therapy:  INR goal 3-3.5 per outpatient clinic note Monitor platelets by  anticoagulation protocol: Yes  Plan: Hold warfarin again today Daily INR CBC at least q72 hr while on warfarin Monitor for signs of bleeding or thrombosis  Camilo Mander Aldona Lento, Student-PharmD

## 2021-02-08 ENCOUNTER — Ambulatory Visit: Payer: Medicare Other | Admitting: Internal Medicine

## 2021-02-08 LAB — BASIC METABOLIC PANEL
Anion gap: 7 (ref 5–15)
BUN: 27 mg/dL — ABNORMAL HIGH (ref 8–23)
CO2: 18 mmol/L — ABNORMAL LOW (ref 22–32)
Calcium: 7.9 mg/dL — ABNORMAL LOW (ref 8.9–10.3)
Chloride: 100 mmol/L (ref 98–111)
Creatinine, Ser: 1.21 mg/dL (ref 0.61–1.24)
GFR, Estimated: 59 mL/min — ABNORMAL LOW (ref >60)
Glucose, Bld: 81 mg/dL (ref 70–99)
Potassium: 4.4 mmol/L (ref 3.5–5.1)
Sodium: 125 mmol/L — ABNORMAL LOW (ref 135–145)

## 2021-02-08 LAB — CBC
HCT: 30.5 % — ABNORMAL LOW (ref 39.0–52.0)
Hemoglobin: 10.3 g/dL — ABNORMAL LOW (ref 13.0–17.0)
MCH: 30.9 pg (ref 26.0–34.0)
MCHC: 33.8 g/dL (ref 30.0–36.0)
MCV: 91.6 fL (ref 80.0–100.0)
Platelets: 250 10*3/uL (ref 150–400)
RBC: 3.33 MIL/uL — ABNORMAL LOW (ref 4.22–5.81)
RDW: 15.7 % — ABNORMAL HIGH (ref 11.5–15.5)
WBC: 17.4 10*3/uL — ABNORMAL HIGH (ref 4.0–10.5)
nRBC: 0 % (ref 0.0–0.2)

## 2021-02-08 LAB — PROTIME-INR
INR: 5.1 (ref 0.8–1.2)
Prothrombin Time: 47.3 seconds — ABNORMAL HIGH (ref 11.4–15.2)

## 2021-02-08 MED ORDER — IPRATROPIUM-ALBUTEROL 0.5-2.5 (3) MG/3ML IN SOLN
3.0000 mL | RESPIRATORY_TRACT | Status: DC | PRN
  Administered 2021-02-08: 3 mL via RESPIRATORY_TRACT
  Filled 2021-02-08: qty 3

## 2021-02-08 MED ORDER — FUROSEMIDE 10 MG/ML IJ SOLN
20.0000 mg | Freq: Once | INTRAMUSCULAR | Status: AC
Start: 2021-02-08 — End: 2021-02-08
  Administered 2021-02-08: 20 mg via INTRAVENOUS
  Filled 2021-02-08: qty 2

## 2021-02-08 NOTE — Plan of Care (Signed)
  Problem: Nutrition: Goal: Adequate nutrition will be maintained Outcome: Progressing   Problem: Safety: Goal: Ability to remain free from injury will improve Outcome: Progressing   Problem: Activity: Goal: Risk for activity intolerance will decrease Outcome: Progressing

## 2021-02-08 NOTE — Progress Notes (Signed)
[facility] for Warfarin Indication: Afib and MVR  Allergies  Allergen Reactions   Diltiazem Other (See Comments)    "Bradycardia," per pt; tolerates Amlodipine   Sulfa Antibiotics Rash and Other (See Comments)    Reaction not fully recalled    Sulfonamide Derivatives Rash and Other (See Comments)    Reaction not fully recalled     Patient Measurements: Height: 5\' 9"  (175.3 cm) Weight: 79.2 kg (174 lb 9.7 oz) IBW/kg (Calculated) : 70.7 Heparin Dosing Weight: 74.5 kg  Vital Signs: Temp: 98.1 F (36.7 C) (11/16 0610) Temp Source: Oral (11/16 0610) BP: 117/64 (11/16 0610) Pulse Rate: 78 (11/16 0610)  Labs: Recent Labs    02/06/21 0432 02/07/21 0436 02/07/21 1010 02/08/21 0457  HGB 10.5*  --  10.7* 10.3*  HCT 30.5*  --  31.5* 30.5*  PLT 266  --  290 250  LABPROT 50.1* 50.7*  --  47.3*  INR 5.5* 5.6*  --  5.1*  CREATININE 1.31*  --  1.23 1.21    Estimated Creatinine Clearance: 44.6 mL/min (by C-G formula based on SCr of 1.21 mg/dL).   Medications:  Scheduled:   allopurinol  300 mg Oral Daily   atorvastatin  20 mg Oral Once per day on Mon Fri   bisoprolol  2.5 mg Oral QHS   fidaxomicin  200 mg Oral BID   latanoprost  1 drop Both Eyes QHS   sodium chloride  1 g Oral TID WC   Warfarin - Pharmacist Dosing Inpatient   Does not apply q1600    Assessment: Admit for severe Cdiff colitis, started on Vancomycin po as outpatient. On Warfarin PTA for Afib & MVR - 5 mg daily, except 2.5 mg MF, with last dose 11/11.  - 11/12 INR 4.8 - 11/13 INR 4.8 - 11/14 INR 5.5 - 11/15 INR 5.6 - 11/16 INR 5.1  Today, 02/08/2021: CBC: Hgb low but stable; Plt stable WNL No bleeding issues reported INR remains SUPRAtherapeutic despite holding warfarin due to drug interaction with Flagyl Flagyl d/c 11/14 due to interaction with warfarin  Started Dificid 11/14 for Cdiff tx  Goal of Therapy:  INR goal 3-3.5 per outpatient clinic note Monitor  platelets by anticoagulation protocol: Yes  Plan: Hold warfarin again today Daily INR CBC at least q72 hr while on warfarin Monitor for signs of bleeding or thrombosis  Karesa Maultsby Aldona Lento, Student-PharmD

## 2021-02-08 NOTE — Progress Notes (Signed)
PROGRESS NOTE  Anthony Gould:[id number] DOB: 25-Feb-1934 DOA: 02/04/2021 PCP: Cassandria Anger, MD   LOS: 4 days   Brief Narrative / Interim history: Pleasant 87 year old male with history of chronic kidney disease stage II, HTN, HLD, chronic diastolic CHF, mitral regurgitation status post mechanical MVR on chronic Coumadin, permanent A. fib, pulmonary hypertension, DM2, cirrhosis, melanoma comes to the hospital with complaints of diarrhea.  He was diagnosed recently with acute appendicitis and underwent appendicectomy on 9/12.  He had persistent pain in the right lower quadrant and was readmitted in October with an infected hematoma.  He had an abscess in that area which appeared to be infected.  He had a drain placed, grew E. coli and was treated with antibiotics.  Drain was removed recently.  Ever since his second hospitalization he has been having intermittent diarrhea but recently this is gotten much worse.  He also has been having poor appetite and weakness.  Subjective / 24h Interval events: Diarrhea slowing down.  He denies any abdominal pain or nausea.  His INR still elevated at 5.1.  Assessment & Plan: Principal problem Severe sepsis due to C. difficile colitis-this is been going on for several weeks, on admission he was extremely dehydrated, hyponatremic, and had significant leukocytosis with WBC 27.  He was febrile and lactic acid was elevated. -C. difficile testing returned positive.  He was initially placed on oral vancomycin and IV metronidazole, however after discussion with pharmacy, they feel like this is interfering with his Coumadin, and he was switched to fidaxomicin on 11/14.  He was approved for patient's assistance and will receive this medication at home as well   Active problems Persistent hyponatremia likely secondary to poor solute intake-overall stable, likely a combination of intravascular depletion and poor solute intake, he appears dehydrated and has  received fluids, however yesterday he had increased cough and felt to be fluid overloaded and fluid had to be stopped.  He has poor p.o. intake, will place on salt tabs Sodium 125, repeat BMP in the morning  Hypokalemia, hypomagnesemia -potassium normalized after repletion   Mild normal anion gap metabolic acidosis -Likely due to diarrhea and home diuretic use.  Stop fluids but continue to hold diuretics   AKI on CKD stage II -Likely prerenal azotemia from dehydration. BUN 25, creatinine 1.6 (baseline 1.0).  Renal function is now stable, creatinine 1.2 this morning   Pleural effusions -CT showing moderate right and small left pleural effusions lying dependently in the areas of passive subsegmental atelectasis in the lower lobes bilaterally.  Patient is not hypoxic at present and has not complained of any shortness of breath no that the fluids were stopped.  We will likely do a thoracentesis prior to discharge once INR better   Chronic diastolic heart failure - Monitor strict I's and O's and daily weight  Mechanical mitral valve -INR currently supratherapeutic, increasing even more from 4.8 >>5.6.  No bleeding, monitor  Supratherapeutic INR INR 5.1 from 5.6 Goal INR between 2.5 and 3.5.  Bilateral lower extremity edema Elevate lower extremities 1 dose of IV Lasix given 20 mg x 1.   Permanent A. Fib -Currently rate controlled.   Diet controlled type 2 diabetes -A1c 4.9   Hypertension -Stable. Continue bisoprolol   Hyperlipidemia -Continue Lipitor   Gout -Continue allopurinol   GERD -stop Protonix given C. Difficile  Scheduled Meds:  allopurinol  300 mg Oral Daily   atorvastatin  20 mg Oral Once per day on Mon Fri  bisoprolol  2.5 mg Oral QHS   fidaxomicin  200 mg Oral BID   latanoprost  1 drop Both Eyes QHS   sodium chloride  1 g Oral TID WC   Warfarin - Pharmacist Dosing Inpatient   Does not apply q1600   Continuous Infusions:   PRN Meds:.acetaminophen **OR**  acetaminophen, ipratropium-albuterol  Diet Orders (From admission, onward)     Start     Ordered   02/07/21 1216  Diet regular Room service appropriate? Yes; Fluid consistency: Thin  Diet effective now       Question Answer Comment  Room service appropriate? Yes   Fluid consistency: Thin      02/07/21 1215            DVT prophylaxis:      Code Status: DNR  Family Communication: Discussed with wife at bedside  Status is: Inpatient  Remains inpatient appropriate because: Persistent symptoms, persistent electrolyte abnormalities  Level of care: Telemetry  Consultants:  None  Procedures:  none  Microbiology  C. difficile-positive  Antimicrobials: P.o. vancomycin, IV metronidazole 11/12   Objective: Vitals:   02/07/21 2038 02/08/21 0610 02/08/21 1214 02/08/21 1353  BP: (!) 123/57 117/64  118/63  Pulse: 86 78  87  Resp: 19 19  14   Temp: 98.1 F (36.7 C) 98.1 F (36.7 C)  97.6 F (36.4 C)  TempSrc: Oral Oral  Oral  SpO2: 97% 97% 95% 100%  Weight:      Height:        Intake/Output Summary (Last 24 hours) at 02/08/2021 1814 Last data filed at 02/08/2021 1572 Gross per 24 hour  Intake --  Output 750 ml  Net -750 ml   Filed Weights   02/05/21 2224  Weight: 79.2 kg    Examination:  Constitutional: Well-developed well-nourished in no acute distress.  He is alert and oriented x3.   Eyes: Anicteric sclera. ENMT: Moist mucous membranes. Neck: Normal supple. Respiratory: Clear to auscultation with no wheezes noted.  Cardiovascular: Regular rate and rhythm no rubs or gallops.  Abdomen: Soft nontender hyperactive bowel sounds.  Nondistended.  Musculoskeletal: No clubbing or cyanosis.  1+ pitting edema in lower extremities bilaterally. Skin: No rashes or lesions noted. Neurologic: No focal motor deficits.  Data Reviewed: I have independently reviewed following labs and imaging studies   CBC: Recent Labs  Lab 02/03/21 1623 02/04/21 0222  02/05/21 0503 02/06/21 0432 02/07/21 1010 02/08/21 0457  WBC 27.0* 21.7* 25.2* 27.3* 23.2* 17.4*  NEUTROABS 24,651* 20.0*  --   --   --   --   HGB 11.2* 10.2* 10.5* 10.5* 10.7* 10.3*  HCT 35.1* 29.6* 31.0* 30.5* 31.5* 30.5*  MCV 94.9 90.8 92.0 91.0 91.8 91.6  PLT 304 232 268 266 290 620   Basic Metabolic Panel: Recent Labs  Lab 02/04/21 0348 02/04/21 0927 02/04/21 1632 02/05/21 0503 02/06/21 0432 02/07/21 1010 02/08/21 0457  NA  --    < > 123* 125* 124* 124* 125*  K 2.6*   < > 3.8 5.3* 4.8 4.5 4.4  CL  --    < > 97* 99 97* 98 100  CO2  --    < > 20* 18* 17* 19* 18*  GLUCOSE  --    < > 134* 105* 91 79 81  BUN  --    < > 24* 24* 30* 32* 27*  CREATININE  --    < > 1.30* 1.38* 1.31* 1.23 1.21  CALCIUM  --    < >  7.4* 7.8* 7.9* 8.0* 7.9*  MG 1.4*  --  1.6*  --   --   --   --    < > = values in this interval not displayed.   Liver Function Tests: Recent Labs  Lab 02/03/21 1623 02/04/21 0222 02/06/21 0432 02/07/21 1010  AST 22 24 21 22   ALT 7* 10 10 10   ALKPHOS  --  71 73 159*  BILITOT 1.1 1.3* 0.9 1.0  PROT 5.7* 5.8* 4.7* 5.0*  ALBUMIN  --  2.9* 2.2* 2.2*   Coagulation Profile: Recent Labs  Lab 02/04/21 0222 02/05/21 0503 02/06/21 0432 02/07/21 0436 02/08/21 0457  INR 4.8* 4.8* 5.5* 5.6* 5.1*   HbA1C: No results for input(s): HGBA1C in the last 72 hours.  CBG: No results for input(s): GLUCAP in the last 168 hours.  Recent Results (from the past 240 hour(s))  Blood Culture (routine x 2)     Status: None (Preliminary result)   Collection Time: 02/04/21  2:22 AM   Specimen: BLOOD  Result Value Ref Range Status   Specimen Description   Final    BLOOD RIGHT ANTECUBITAL Performed at [facility] [address], [place], [place] [address]    Special Requests   Final    BOTTLES DRAWN AEROBIC AND ANAEROBIC Blood Culture results may not be optimal due to an excessive volume of blood received in culture bottles Performed at [facility] [address], [place], [place] [address]    Culture   Final    NO GROWTH 4 DAYS Performed at [facility], [place] [address] [place], [place] [address]    Report Status PENDING  Incomplete  Urine Culture     Status: Abnormal   Collection Time: 02/04/21  2:29 AM   Specimen: In/Out Cath Urine  Result Value Ref Range Status   Specimen Description   Final    IN/OUT CATH URINE Performed at [facility] [address], [place], [place] [address]    Special Requests   Final    Normal Performed at [facility], [place] [address], [place], [place] [address]    Culture (A)  Final    20,000 COLONIES/mL ENTEROCOCCUS FAECIUM 2,000 COLONIES/mL ENTEROCOCCUS FAECALIS    Report Status 02/07/2021 FINAL  Final   Organism ID, Bacteria ENTEROCOCCUS FAECIUM (A)  Final   Organism ID, Bacteria ENTEROCOCCUS FAECALIS (A)  Final      Susceptibility   Enterococcus faecalis - MIC*    AMPICILLIN <=2 SENSITIVE Sensitive     NITROFURANTOIN <=16 SENSITIVE Sensitive     VANCOMYCIN 1 SENSITIVE Sensitive     * 2,000 COLONIES/mL ENTEROCOCCUS FAECALIS   Enterococcus faecium - MIC*    AMPICILLIN RESISTANT Resistant     NITROFURANTOIN 64 INTERMEDIATE Intermediate     VANCOMYCIN <=0.5 SENSITIVE Sensitive     * 20,000 COLONIES/mL ENTEROCOCCUS FAECIUM  Resp Panel by RT-PCR (Flu A&B, Covid) Nasopharyngeal Swab     Status: None   Collection Time: 02/04/21  2:52 AM   Specimen: Nasopharyngeal Swab; Nasopharyngeal(NP) swabs in vial transport medium  Result Value Ref Range Status   SARS Coronavirus 2 by RT PCR NEGATIVE NEGATIVE Final    Comment: (NOTE) SARS-CoV-2 target nucleic acids are NOT DETECTED.  The SARS-CoV-2 RNA is generally detectable in upper respiratory specimens during the acute phase of infection. The lowest concentration of SARS-CoV-2 viral copies this assay can detect is 138 copies/mL. A negative result does not preclude  SARS-Cov-2 infection and should not be used as the sole basis for treatment or other patient management decisions. A negative result may occur with  improper specimen collection/handling, submission of specimen other than nasopharyngeal swab, presence of viral mutation(s) within the areas targeted by this assay, and inadequate number of viral copies(<138 copies/mL). A negative result must be combined with clinical observations, patient history, and epidemiological information. The expected result is Negative.  Fact Sheet for Patients:  EntrepreneurPulse.com.au  Fact Sheet for Healthcare Providers:  IncredibleEmployment.be  This test is no t yet approved or cleared by the [place] FDA and  has been authorized for detection and/or diagnosis of SARS-CoV-2 by FDA under an Emergency Use Authorization (EUA). This EUA will remain  in effect (meaning this test can be used) for the duration of the COVID-19 declaration under Section 564(b)(1) of the Act, 21 U.S.C.section 360bbb-3(b)(1), unless the authorization is terminated  or revoked sooner.       Influenza A by PCR NEGATIVE NEGATIVE Final   Influenza B by PCR NEGATIVE NEGATIVE Final    Comment: (NOTE) The Xpert Xpress SARS-CoV-2/FLU/RSV plus assay is intended as an aid in the diagnosis of influenza from Nasopharyngeal swab specimens and should not be used as a sole basis for treatment. Nasal washings and aspirates are unacceptable for Xpert Xpress SARS-CoV-2/FLU/RSV testing.  Fact Sheet for Patients: EntrepreneurPulse.com.au  Fact Sheet for Healthcare Providers: IncredibleEmployment.be  This test is not yet approved or cleared by the [place] FDA and has been authorized for detection and/or diagnosis of SARS-CoV-2 by FDA under an Emergency Use Authorization (EUA). This EUA will remain in effect (meaning this test can be used) for the duration of  the COVID-19 declaration under Section 564(b)(1) of the Act, 21 U.S.C. section 360bbb-3(b)(1), unless the authorization is terminated or revoked.  Performed at [facility], [place] [address], [place], [place] [address]   Blood Culture (routine x 2)     Status: None (Preliminary result)   Collection Time: 02/04/21  2:57 AM   Specimen: BLOOD  Result Value Ref Range Status   Specimen Description   Final    BLOOD BLOOD RIGHT HAND Performed at [facility] [address], [place], [place] [address]    Special Requests   Final    BOTTLES DRAWN AEROBIC AND ANAEROBIC Blood Culture adequate volume Performed at [facility] [address] [place], [place] [address]    Culture   Final    NO GROWTH 4 DAYS Performed at [facility], [place] [address], [place], [place] [address]    Report Status PENDING  Incomplete  C Difficile Quick Screen w PCR reflex     Status: Abnormal   Collection Time: 02/04/21  4:04 AM   Specimen: Stool  Result Value Ref Range Status   C Diff antigen POSITIVE (A) NEGATIVE Final   C Diff toxin POSITIVE (A) NEGATIVE Final   C Diff interpretation Toxin producing C. difficile detected.  Final    Comment: CRITICAL RESULT CALLED TO, READ BACK BY AND VERIFIED WITH: Valma Cava RN ON 02/04/2021 @ 2376 BY GOLSONM Performed at [facility] [address], [place], [place] [address]       Radiology Studies: No results found.   Marzetta Board, MD, PhD [facility]  Between 7 am - 7 pm I am available, please contact me via Amion (for emergencies) or Securechat (non urgent messages)  Between 7 pm - 7 am I am  not available, please contact night coverage MD/APP via Amion

## 2021-02-08 NOTE — Progress Notes (Signed)
Physical Therapy Treatment Patient Details Name: Anthony Gould MRN: [id number] DOB: 1933/09/29 Today's Date: 02/08/2021   History of Present Illness 87 year old male comes to the hospital with complaints of diarrhea. Dx of c diff.  He was diagnosed recently with acute appendicitis and underwent appendicectomy on 9/12.  He had persistent pain in the right lower quadrant and was readmitted in October with an infected hematoma. with history of chronic kidney disease stage II, HTN, HLD, chronic diastolic CHF, mitral regurgitation status post mechanical MVR on chronic Coumadin, permanent A. fib, pulmonary hypertension, DM2, cirrhosis, melanoma.    PT Comments    Pt fatigued from sitting up in chair early this morning and recently transferred back to bed with nursing, but agreeable to ambulate with PT. Pt ambulates 120 ft with RW, slow step through pattern without LOB. Upon return to room, pt requests to return to supine wanting to rest, reports being more tired today. Will continue to progress as able.   Recommendations for follow up therapy are one component of a multi-disciplinary discharge planning process, led by the attending physician.  Recommendations may be updated based on patient status, additional functional criteria and insurance authorization.  Follow Up Recommendations  Home health PT     Assistance Recommended at Discharge Set up Supervision/Assistance  Equipment Recommendations  None recommended by PT    Recommendations for Other Services       Precautions / Restrictions Precautions Precautions: Fall Restrictions Weight Bearing Restrictions: No     Mobility  Bed Mobility Overal bed mobility: Modified Independent  General bed mobility comments: slow, labored movement with use of bedrail and elevated HOB    Transfers Overall transfer level: Needs assistance Equipment used: Rolling walker (2 wheels) Transfers: Sit to/from Stand Sit to Stand: Min assist  General  transfer comment: VC for hand placement and BLE quad/glute engagement, min A to power to stand    Ambulation/Gait Ambulation/Gait assistance: Min guard Gait Distance (Feet): 120 Feet Assistive device: Rolling walker (2 wheels) Gait Pattern/deviations: Step-through pattern;Decreased stride length Gait velocity: decreased  General Gait Details: slow, steady step through pattern with RW, wheezing and 2/4 dyspnea noted with pt able to continue conversation, no overt LOB or falls   Stairs             Wheelchair Mobility    Modified Rankin (Stroke Patients Only)       Balance Overall balance assessment: No apparent balance deficits (not formally assessed)        Cognition Arousal/Alertness: Awake/alert Behavior During Therapy: WFL for tasks assessed/performed Overall Cognitive Status: Within Functional Limits for tasks assessed     Exercises      General Comments        Pertinent Vitals/Pain Pain Assessment: No/denies pain    Home Living                          Prior Function            PT Goals (current goals can now be found in the care plan section) Acute Rehab PT Goals Patient Stated Goal: to get strength back PT Goal Formulation: With patient Time For Goal Achievement: 02/20/21 Potential to Achieve Goals: Good Progress towards PT goals: Progressing toward goals    Frequency    Min 3X/week      PT Plan Current plan remains appropriate    Co-evaluation              AM-PAC PT "  6 Clicks" Mobility   Outcome Measure  Help needed turning from your back to your side while in a flat bed without using bedrails?: None Help needed moving from lying on your back to sitting on the side of a flat bed without using bedrails?: A Little Help needed moving to and from a bed to a chair (including a wheelchair)?: A Little Help needed standing up from a chair using your arms (e.g., wheelchair or bedside chair)?: A Little Help needed to walk in  hospital room?: A Little Help needed climbing 3-5 steps with a railing? : A Little 6 Click Score: 19    End of Session Equipment Utilized During Treatment: Gait belt Activity Tolerance: Patient limited by fatigue Patient left: in bed;with call bell/phone within reach;with bed alarm set Nurse Communication: Mobility status PT Visit Diagnosis: Difficulty in walking, not elsewhere classified (R26.2)     Time: 6659-9357 PT Time Calculation (min) (ACUTE ONLY): 21 min  Charges:  $Gait Training: 8-22 mins                      Tori Bettymae Yott PT, DPT 02/08/21, 11:04 AM

## 2021-02-09 LAB — CBC
HCT: 31.6 % — ABNORMAL LOW (ref 39.0–52.0)
Hemoglobin: 10.4 g/dL — ABNORMAL LOW (ref 13.0–17.0)
MCH: 30.4 pg (ref 26.0–34.0)
MCHC: 32.9 g/dL (ref 30.0–36.0)
MCV: 92.4 fL (ref 80.0–100.0)
Platelets: 247 10*3/uL (ref 150–400)
RBC: 3.42 MIL/uL — ABNORMAL LOW (ref 4.22–5.81)
RDW: 15.8 % — ABNORMAL HIGH (ref 11.5–15.5)
WBC: 12.9 10*3/uL — ABNORMAL HIGH (ref 4.0–10.5)
nRBC: 0 % (ref 0.0–0.2)

## 2021-02-09 LAB — COMPREHENSIVE METABOLIC PANEL
ALT: 10 U/L (ref 0–44)
AST: 22 U/L (ref 15–41)
Albumin: 2.3 g/dL — ABNORMAL LOW (ref 3.5–5.0)
Alkaline Phosphatase: 100 U/L (ref 38–126)
Anion gap: 7 (ref 5–15)
BUN: 23 mg/dL (ref 8–23)
CO2: 21 mmol/L — ABNORMAL LOW (ref 22–32)
Calcium: 7.9 mg/dL — ABNORMAL LOW (ref 8.9–10.3)
Chloride: 100 mmol/L (ref 98–111)
Creatinine, Ser: 1.06 mg/dL (ref 0.61–1.24)
GFR, Estimated: >60 mL/min (ref >60)
Glucose, Bld: 83 mg/dL (ref 70–99)
Potassium: 4.3 mmol/L (ref 3.5–5.1)
Sodium: 128 mmol/L — ABNORMAL LOW (ref 135–145)
Total Bilirubin: 1.1 mg/dL (ref 0.3–1.2)
Total Protein: 4.9 g/dL — ABNORMAL LOW (ref 6.5–8.1)

## 2021-02-09 LAB — CULTURE, BLOOD (ROUTINE X 2)
Culture: NO GROWTH
Culture: NO GROWTH
Special Requests: ADEQUATE

## 2021-02-09 LAB — MAGNESIUM: Magnesium: 1.7 mg/dL (ref 1.7–2.4)

## 2021-02-09 LAB — PROTIME-INR
INR: 5.2 (ref 0.8–1.2)
Prothrombin Time: 47.8 seconds — ABNORMAL HIGH (ref 11.4–15.2)

## 2021-02-09 MED ORDER — FUROSEMIDE 10 MG/ML IJ SOLN
20.0000 mg | Freq: Once | INTRAMUSCULAR | Status: AC
Start: 2021-02-09 — End: 2021-02-09
  Administered 2021-02-09: 18:00:00 20 mg via INTRAVENOUS
  Filled 2021-02-09: qty 2

## 2021-02-09 MED ORDER — SACCHAROMYCES BOULARDII 250 MG PO CAPS
250.0000 mg | ORAL_CAPSULE | Freq: Two times a day (BID) | ORAL | Status: DC
  Administered 2021-02-09 – 2021-02-12 (×6): 250 mg via ORAL
  Filled 2021-02-09 (×6): qty 1

## 2021-02-09 MED ORDER — IPRATROPIUM-ALBUTEROL 0.5-2.5 (3) MG/3ML IN SOLN
3.0000 mL | Freq: Two times a day (BID) | RESPIRATORY_TRACT | Status: DC
Start: 2021-02-09 — End: 2021-02-09

## 2021-02-09 MED ORDER — IPRATROPIUM-ALBUTEROL 0.5-2.5 (3) MG/3ML IN SOLN
3.0000 mL | Freq: Two times a day (BID) | RESPIRATORY_TRACT | Status: DC
Start: 2021-02-09 — End: 2021-02-12
  Administered 2021-02-09 – 2021-02-12 (×5): 3 mL via RESPIRATORY_TRACT
  Filled 2021-02-09 (×6): qty 3

## 2021-02-09 NOTE — Care Management Important Message (Signed)
Important Message  Patient Details IM Letter placed in Patients room. Name: Anthony Gould MRN: [id number] Date of Birth: 1933/08/15   Medicare Important Message Given:  Yes     Kerin Salen 02/09/2021, 11:36 AM

## 2021-02-09 NOTE — Progress Notes (Signed)
[facility] for Warfarin Indication: Afib and MVR  Allergies  Allergen Reactions   Diltiazem Other (See Comments)    "Bradycardia," per pt; tolerates Amlodipine   Sulfa Antibiotics Rash and Other (See Comments)    Reaction not fully recalled    Sulfonamide Derivatives Rash and Other (See Comments)    Reaction not fully recalled    Patient Measurements: Height: 5\' 9"  (175.3 cm) Weight: 79.2 kg (174 lb 9.7 oz) IBW/kg (Calculated) : 70.7 Heparin Dosing Weight: 74.5 kg  Vital Signs: Temp: 97.8 F (36.6 C) (11/17 1115) Temp Source: Oral (11/17 1115) BP: 118/72 (11/17 1115) Pulse Rate: 84 (11/17 1115)  Labs: Recent Labs    02/07/21 0436 02/07/21 1010 02/07/21 1010 02/08/21 0457 02/09/21 0443 02/09/21 0724  HGB  --  10.7*   < > 10.3*  --  10.4*  HCT  --  31.5*  --  30.5*  --  31.6*  PLT  --  290  --  250  --  247  LABPROT 50.7*  --   --  47.3* 47.8*  --   INR 5.6*  --   --  5.1* 5.2*  --   CREATININE  --  1.23  --  1.21  --  1.06   < > = values in this interval not displayed.   Estimated Creatinine Clearance: 50.9 mL/min (by C-G formula based on SCr of 1.06 mg/dL).  Medications:  Scheduled:   allopurinol  300 mg Oral Daily   atorvastatin  20 mg Oral Once per day on Mon Fri   bisoprolol  2.5 mg Oral QHS   fidaxomicin  200 mg Oral BID   latanoprost  1 drop Both Eyes QHS   sodium chloride  1 g Oral TID WC   Warfarin - Pharmacist Dosing Inpatient   Does not apply q1600   Assessment: Admit for severe Cdiff colitis, started on Vancomycin po as outpatient. On Warfarin PTA for Afib & MVR - 5 mg daily, except 2.5 mg MF, with last dose 11/11.  - 11/12 INR 4.8 - 11/13 INR 4.8 - 11/14 INR 5.5 - 11/15 INR 5.6 - 11/16 INR 5.1 - 11/17 INR 5.2  Today, 02/09/2021: CBC: Hgb low but stable; Plt stable WNL No bleeding issues reported INR remains SUPRAtherapeutic despite holding warfarin due to drug interaction with Flagyl Flagyl d/c 11/14  due to interaction with warfarin  Started Dificid 11/14 for Cdiff tx  Goal of Therapy:  INR goal 3-3.5 per outpatient clinic note Monitor platelets by anticoagulation protocol: Yes  Plan: Hold warfarin again today Daily INR CBC at least q72 hr while on warfarin Monitor for signs of bleeding or thrombosis  Anthony Gould, Student-PharmD

## 2021-02-09 NOTE — Progress Notes (Signed)
PROGRESS NOTE  AREEB CORRON ZOX:[id number] DOB: 1933/04/21 DOA: 02/04/2021 PCP: Cassandria Anger, MD   LOS: 5 days   Brief Narrative / Interim history: Pleasant 87 year old male with history of chronic kidney disease stage II, HTN, HLD, chronic diastolic CHF, mitral regurgitation status post mechanical MVR on chronic Coumadin, permanent A. fib, pulmonary hypertension, DM2, cirrhosis, melanoma comes to the hospital with complaints of diarrhea.  He was diagnosed recently with acute appendicitis and underwent appendicectomy on 9/12.  He had persistent pain in the right lower quadrant and was readmitted in October with an infected hematoma.  He had an abscess in that area which appeared to be infected.  He had a drain placed, grew E. coli and was treated with antibiotics.  Drain was removed recently.  Ever since his second hospitalization he has been having intermittent diarrhea but recently this is gotten much worse.  He also has been having poor appetite and weakness.  Subjective / 24h Interval events: Reports 1 mushy stool this morning.  Added probiotics.  Assessment & Plan: Principal problem Severe sepsis, sepsis criteria is improving, due to C. difficile colitis-this is been going on for several weeks, on admission he was extremely dehydrated, hyponatremic, and had significant leukocytosis with WBC 27.  He was febrile and lactic acid was elevated. -C. difficile testing returned positive.  He was initially placed on oral vancomycin and IV metronidazole, however after discussion with pharmacy, they feel like this is interfering with his Coumadin, and he was switched to fidaxomicin on 11/14.  He was approved for patient's assistance and will receive this medication at home as well. Probiotics added, Florastor 250 mg twice daily on 02/09/2021.   Persistent hyponatremia likely secondary to poor solute intake- Serum sodium is uptrending 128 from 125.   Continue to encourage oral solute  intake.  Resolved post repletion: Hypokalemia, hypomagnesemia  Serum potassium and serum magnesium normalized after repletion.  Improving mild non anion gap metabolic acidosis -Likely due to diarrhea and home diuretic use.   Stop fluids but continue to hold diuretics Serum bicarb 21 from 18.  Anion gap 7.   Improving AKI on CKD stage II -Likely prerenal azotemia from dehydration. BUN 25, creatinine 1.6 (baseline 1.0).   Back to his baseline creatinine 1.0 with GFR greater than 60.  Pleural effusions -CT showing moderate right and small left pleural effusions lying dependently in the areas of passive subsegmental atelectasis in the lower lobes bilaterally.  Patient is not hypoxic at present and has not complained of any shortness of breath no that the fluids were stopped.  We will likely do a thoracentesis prior to discharge once INR better   Chronic diastolic heart failure - Received a dose of IV Lasix on 02/08/2021 and 02/09/2021 Monitor strict I's and O's and daily weight  Mechanical mitral valve -INR currently supratherapeutic, increasing even more from 4.8 >>5.6.  No bleeding, monitor Goal INR between 2.5 and 3.5.    Supratherapeutic INR INR 5.2 from 5.1. Continue to trend  Bilateral lower extremity edema Elevate lower extremities 1 dose of IV Lasix given 20 mg x 1 on 02/09/2011.   Permanent A. Fib -Currently rate controlled on bisoprolol.   Diet controlled type 2 diabetes -A1c 4.9   Hypertension -Stable. Continue bisoprolol   Hyperlipidemia -Continue Lipitor   Gout -Continue allopurinol   GERD -stop Protonix given C. Difficile  Scheduled Meds:  allopurinol  300 mg Oral Daily   atorvastatin  20 mg Oral Once per day on Mon  Fri   bisoprolol  2.5 mg Oral QHS   fidaxomicin  200 mg Oral BID   latanoprost  1 drop Both Eyes QHS   sodium chloride  1 g Oral TID WC   Warfarin - Pharmacist Dosing Inpatient   Does not apply q1600   Continuous Infusions:   PRN  Meds:.acetaminophen **OR** acetaminophen, ipratropium-albuterol  Diet Orders (From admission, onward)     Start     Ordered   02/07/21 1216  Diet regular Room service appropriate? Yes; Fluid consistency: Thin  Diet effective now       Question Answer Comment  Room service appropriate? Yes   Fluid consistency: Thin      02/07/21 1215            DVT prophylaxis:      Code Status: DNR  Family Communication: None at bedside.  Status is: Inpatient  Remains inpatient appropriate because: Persistent symptoms, persistent electrolyte abnormalities  Level of care: Telemetry  Consultants:  None  Procedures:  none  Microbiology  C. difficile-positive  Antimicrobials: P.o. vancomycin, IV metronidazole 11/12 Dificid 02/06/21 x 10 days   Objective: Vitals:   02/09/21 0336 02/09/21 1115 02/09/21 1200 02/09/21 1300  BP: 123/68 118/72    Pulse: 92 84    Resp: 18 16 17 16   Temp: 98.3 F (36.8 C) 97.8 F (36.6 C)    TempSrc: Oral Oral    SpO2: 96% 98%    Weight:      Height:        Intake/Output Summary (Last 24 hours) at 02/09/2021 1611 Last data filed at 02/09/2021 0342 Gross per 24 hour  Intake --  Output 1275 ml  Net -1275 ml   Filed Weights   02/05/21 2224  Weight: 79.2 kg    Examination:  Constitutional: Well-developed well-nourished in no acute distress.  He is alert and oriented x3.  Eyes: Anicteric sclera ENMT: Moist mucous membranes but  neck: Normal supple. Respiratory: Clear to auscultation mild diffuse wheezing noted.   Cardiovascular: Regular rate and rhythm no rubs or gallops.  Abdomen: Soft nontender normal bowel sounds present.  Nondistended.    Musculoskeletal: Trace lower extremity edema bilaterally.   Skin: No rashes or ulcerative lesions noted. Neurologic: No focal motor deficits.  Data Reviewed: I have independently reviewed following labs and imaging studies   CBC: Recent Labs  Lab 02/03/21 1623 02/04/21 0222 02/05/21 0503  02/06/21 0432 02/07/21 1010 02/08/21 0457 02/09/21 0724  WBC 27.0* 21.7* 25.2* 27.3* 23.2* 17.4* 12.9*  NEUTROABS 24,651* 20.0*  --   --   --   --   --   HGB 11.2* 10.2* 10.5* 10.5* 10.7* 10.3* 10.4*  HCT 35.1* 29.6* 31.0* 30.5* 31.5* 30.5* 31.6*  MCV 94.9 90.8 92.0 91.0 91.8 91.6 92.4  PLT 304 232 268 266 290 250 676   Basic Metabolic Panel: Recent Labs  Lab 02/04/21 0348 02/04/21 0927 02/04/21 1632 02/05/21 0503 02/06/21 0432 02/07/21 1010 02/08/21 0457 02/09/21 0724  NA  --    < > 123* 125* 124* 124* 125* 128*  K 2.6*   < > 3.8 5.3* 4.8 4.5 4.4 4.3  CL  --    < > 97* 99 97* 98 100 100  CO2  --    < > 20* 18* 17* 19* 18* 21*  GLUCOSE  --    < > 134* 105* 91 79 81 83  BUN  --    < > 24* 24* 30* 32* 27* 23  CREATININE  --    < > 1.30* 1.38* 1.31* 1.23 1.21 1.06  CALCIUM  --    < > 7.4* 7.8* 7.9* 8.0* 7.9* 7.9*  MG 1.4*  --  1.6*  --   --   --   --  1.7   < > = values in this interval not displayed.   Liver Function Tests: Recent Labs  Lab 02/03/21 1623 02/04/21 0222 02/06/21 0432 02/07/21 1010 02/09/21 0724  AST 22 24 21 22 22   ALT 7* 10 10 10 10   ALKPHOS  --  71 73 159* 100  BILITOT 1.1 1.3* 0.9 1.0 1.1  PROT 5.7* 5.8* 4.7* 5.0* 4.9*  ALBUMIN  --  2.9* 2.2* 2.2* 2.3*   Coagulation Profile: Recent Labs  Lab 02/05/21 0503 02/06/21 0432 02/07/21 0436 02/08/21 0457 02/09/21 0443  INR 4.8* 5.5* 5.6* 5.1* 5.2*   HbA1C: No results for input(s): HGBA1C in the last 72 hours.  CBG: No results for input(s): GLUCAP in the last 168 hours.  Recent Results (from the past 240 hour(s))  Blood Culture (routine x 2)     Status: None   Collection Time: 02/04/21  2:22 AM   Specimen: BLOOD  Result Value Ref Range Status   Specimen Description   Final    BLOOD RIGHT ANTECUBITAL Performed at [facility] [address] [place], [place] [address]    Special Requests   Final    BOTTLES DRAWN AEROBIC AND ANAEROBIC Blood Culture results may not be  optimal due to an excessive volume of blood received in culture bottles Performed at [facility] [address] [place], [place] [address]    Culture   Final    NO GROWTH 5 DAYS Performed at [facility], [place] [address] [place], [place] [address]    Report Status 02/09/2021 FINAL  Final  Urine Culture     Status: Abnormal   Collection Time: 02/04/21  2:29 AM   Specimen: In/Out Cath Urine  Result Value Ref Range Status   Specimen Description   Final    IN/OUT CATH URINE Performed at [facility] [address] [place], [place] [address]    Special Requests   Final    Normal Performed at [facility], [place] [address], [place], [place] [address]    Culture (A)  Final    20,000 COLONIES/mL ENTEROCOCCUS FAECIUM 2,000 COLONIES/mL ENTEROCOCCUS FAECALIS    Report Status 02/07/2021 FINAL  Final   Organism ID, Bacteria ENTEROCOCCUS FAECIUM (A)  Final   Organism ID, Bacteria ENTEROCOCCUS FAECALIS (A)  Final      Susceptibility   Enterococcus faecalis - MIC*    AMPICILLIN <=2 SENSITIVE Sensitive     NITROFURANTOIN <=16 SENSITIVE Sensitive     VANCOMYCIN 1 SENSITIVE Sensitive     * 2,000 COLONIES/mL ENTEROCOCCUS FAECALIS   Enterococcus faecium - MIC*    AMPICILLIN RESISTANT Resistant     NITROFURANTOIN 64 INTERMEDIATE Intermediate     VANCOMYCIN <=0.5 SENSITIVE Sensitive     * 20,000 COLONIES/mL ENTEROCOCCUS FAECIUM  Resp Panel by RT-PCR (Flu A&B, Covid) Nasopharyngeal Swab     Status: None   Collection Time: 02/04/21  2:52 AM   Specimen: Nasopharyngeal Swab; Nasopharyngeal(NP) swabs in vial transport medium  Result Value Ref Range Status   SARS Coronavirus 2 by RT PCR NEGATIVE NEGATIVE Final    Comment: (NOTE) SARS-CoV-2 target nucleic acids are NOT DETECTED.  The SARS-CoV-2 RNA is  generally detectable in upper respiratory specimens during the acute phase of infection. The lowest concentration of SARS-CoV-2 viral  copies this assay can detect is 138 copies/mL. A negative result does not preclude SARS-Cov-2 infection and should not be used as the sole basis for treatment or other patient management decisions. A negative result may occur with  improper specimen collection/handling, submission of specimen other than nasopharyngeal swab, presence of viral mutation(s) within the areas targeted by this assay, and inadequate number of viral copies(<138 copies/mL). A negative result must be combined with clinical observations, patient history, and epidemiological information. The expected result is Negative.  Fact Sheet for Patients:  EntrepreneurPulse.com.au  Fact Sheet for Healthcare Providers:  IncredibleEmployment.be  This test is no t yet approved or cleared by the [place] FDA and  has been authorized for detection and/or diagnosis of SARS-CoV-2 by FDA under an Emergency Use Authorization (EUA). This EUA will remain  in effect (meaning this test can be used) for the duration of the COVID-19 declaration under Section 564(b)(1) of the Act, 21 U.S.C.section 360bbb-3(b)(1), unless the authorization is terminated  or revoked sooner.       Influenza A by PCR NEGATIVE NEGATIVE Final   Influenza B by PCR NEGATIVE NEGATIVE Final    Comment: (NOTE) The Xpert Xpress SARS-CoV-2/FLU/RSV plus assay is intended as an aid in the diagnosis of influenza from Nasopharyngeal swab specimens and should not be used as a sole basis for treatment. Nasal washings and aspirates are unacceptable for Xpert Xpress SARS-CoV-2/FLU/RSV testing.  Fact Sheet for Patients: EntrepreneurPulse.com.au  Fact Sheet for Healthcare Providers: IncredibleEmployment.be  This test is not yet approved or cleared by the [place] FDA and has been authorized for detection and/or diagnosis of SARS-CoV-2 by FDA under an Emergency Use Authorization (EUA). This  EUA will remain in effect (meaning this test can be used) for the duration of the COVID-19 declaration under Section 564(b)(1) of the Act, 21 U.S.C. section 360bbb-3(b)(1), unless the authorization is terminated or revoked.  Performed at [facility], [place] [address] [place], [place] [address]   Blood Culture (routine x 2)     Status: None   Collection Time: 02/04/21  2:57 AM   Specimen: BLOOD  Result Value Ref Range Status   Specimen Description   Final    BLOOD BLOOD RIGHT HAND Performed at [facility] [address], [place], [place] [address]    Special Requests   Final    BOTTLES DRAWN AEROBIC AND ANAEROBIC Blood Culture adequate volume Performed at [facility] [address] [place], [place] [address]    Culture   Final    NO GROWTH 5 DAYS Performed at [facility], [place] [address] [place], [place] [address]    Report Status 02/09/2021 FINAL  Final  C Difficile Quick Screen w PCR reflex     Status: Abnormal   Collection Time: 02/04/21  4:04 AM   Specimen: Stool  Result Value Ref Range Status   C Diff antigen POSITIVE (A) NEGATIVE Final   C Diff toxin POSITIVE (A) NEGATIVE Final   C Diff interpretation Toxin producing C. difficile detected.  Final    Comment: CRITICAL RESULT CALLED TO, READ BACK BY AND VERIFIED WITH: Valma Cava RN ON 02/04/2021 @ 3244 BY GOLSONM Performed at [facility] [address], [place], [place] [address]       Radiology Studies: No results found.   Marzetta Board, MD, PhD [facility]  Between 7 am - 7 pm I am available, please contact me via Amion (for emergencies) or Securechat (non urgent messages)  Between 7 pm - 7 am I am not available, please contact night coverage MD/APP via Amion

## 2021-02-10 LAB — COMPREHENSIVE METABOLIC PANEL
ALT: 11 U/L (ref 0–44)
AST: 26 U/L (ref 15–41)
Albumin: 2.2 g/dL — ABNORMAL LOW (ref 3.5–5.0)
Alkaline Phosphatase: 104 U/L (ref 38–126)
Anion gap: 5 (ref 5–15)
BUN: 21 mg/dL (ref 8–23)
CO2: 23 mmol/L (ref 22–32)
Calcium: 8 mg/dL — ABNORMAL LOW (ref 8.9–10.3)
Chloride: 102 mmol/L (ref 98–111)
Creatinine, Ser: 1.04 mg/dL (ref 0.61–1.24)
GFR, Estimated: >60 mL/min (ref >60)
Glucose, Bld: 116 mg/dL — ABNORMAL HIGH (ref 70–99)
Potassium: 4 mmol/L (ref 3.5–5.1)
Sodium: 130 mmol/L — ABNORMAL LOW (ref 135–145)
Total Bilirubin: 0.8 mg/dL (ref 0.3–1.2)
Total Protein: 5 g/dL — ABNORMAL LOW (ref 6.5–8.1)

## 2021-02-10 LAB — CBC
HCT: 29.2 % — ABNORMAL LOW (ref 39.0–52.0)
Hemoglobin: 9.7 g/dL — ABNORMAL LOW (ref 13.0–17.0)
MCH: 30.6 pg (ref 26.0–34.0)
MCHC: 33.2 g/dL (ref 30.0–36.0)
MCV: 92.1 fL (ref 80.0–100.0)
Platelets: 256 10*3/uL (ref 150–400)
RBC: 3.17 MIL/uL — ABNORMAL LOW (ref 4.22–5.81)
RDW: 15.4 % (ref 11.5–15.5)
WBC: 9.4 10*3/uL (ref 4.0–10.5)
nRBC: 0 % (ref 0.0–0.2)

## 2021-02-10 LAB — PROTIME-INR
INR: 4.4 (ref 0.8–1.2)
Prothrombin Time: 41.6 seconds — ABNORMAL HIGH (ref 11.4–15.2)

## 2021-02-10 MED ORDER — FUROSEMIDE 10 MG/ML IJ SOLN
20.0000 mg | Freq: Once | INTRAMUSCULAR | Status: AC
  Administered 2021-02-10: 20 mg via INTRAVENOUS
  Filled 2021-02-10: qty 2

## 2021-02-10 NOTE — Progress Notes (Signed)
[facility] for Warfarin Indication: Afib and MVR  Allergies  Allergen Reactions   Diltiazem Other (See Comments)    "Bradycardia," per pt; tolerates Amlodipine   Sulfa Antibiotics Rash and Other (See Comments)    Reaction not fully recalled    Sulfonamide Derivatives Rash and Other (See Comments)    Reaction not fully recalled    Patient Measurements: Height: 5\' 9"  (175.3 cm) Weight: 79.2 kg (174 lb 9.7 oz) IBW/kg (Calculated) : 70.7 Heparin Dosing Weight: 74.5 kg  Vital Signs: Temp: 98 F (36.7 C) (11/18 1157) Temp Source: Oral (11/18 1157) BP: 118/66 (11/18 1157) Pulse Rate: 101 (11/18 1157)  Labs: Recent Labs    02/08/21 0457 02/09/21 0443 02/09/21 0724 02/10/21 0440  HGB 10.3*  --  10.4* 9.7*  HCT 30.5*  --  31.6* 29.2*  PLT 250  --  247 256  LABPROT 47.3* 47.8*  --  41.6*  INR 5.1* 5.2*  --  4.4*  CREATININE 1.21  --  1.06 1.04   Estimated Creatinine Clearance: 51.9 mL/min (by C-G formula based on SCr of 1.04 mg/dL).  Medications:  Scheduled:   allopurinol  300 mg Oral Daily   atorvastatin  20 mg Oral Once per day on Mon Fri   bisoprolol  2.5 mg Oral QHS   fidaxomicin  200 mg Oral BID   ipratropium-albuterol  3 mL Nebulization BID   latanoprost  1 drop Both Eyes QHS   saccharomyces boulardii  250 mg Oral BID   sodium chloride  1 g Oral TID WC   Warfarin - Pharmacist Dosing Inpatient   Does not apply q1600   Assessment: Admit for severe Cdiff colitis, started on Vancomycin po as outpatient. On Warfarin PTA for Afib & MVR - 5 mg daily, except 2.5 mg MF, with last dose 11/11.  - 11/12 INR 4.8 - 11/13 INR 4.8 - 11/14 INR 5.5 - 11/15 INR 5.6 - 11/16 INR 5.1 - 11/17 INR 5.2 - 11/18 INR 4.4  Today, 02/10/2021: CBC: Hgb low 9.7<10.4 continue to monitor; Plt stable WNL No bleeding issues reported INR remains SUPRAtherapeutic despite holding warfarin due to drug interaction with Flagyl Flagyl d/c 11/14 due to  interaction with warfarin  Started Dificid 11/14 for Cdiff tx  Goal of Therapy:  INR goal 3-3.5 per outpatient clinic note Monitor platelets by anticoagulation protocol: Yes  Plan: Hold warfarin again today Daily INR CBC at least q72 hr while on warfarin Monitor for signs of bleeding or thrombosis  Yavuz Kirby Aldona Lento, Student-PharmD

## 2021-02-10 NOTE — Progress Notes (Signed)
Call from tele: pt had 9 beat run of Vtach. Pt asymptomatic, Vital signs stable. Provider made aware

## 2021-02-10 NOTE — Plan of Care (Signed)
°  Problem: Activity: °Goal: Risk for activity intolerance will decrease °Outcome: Progressing °  °Problem: Elimination: °Goal: Will not experience complications related to urinary retention °Outcome: Progressing °  °Problem: Pain Managment: °Goal: General experience of comfort will improve °Outcome: Progressing °  °Problem: Safety: °Goal: Ability to remain free from injury will improve °Outcome: Progressing °  °

## 2021-02-10 NOTE — Progress Notes (Signed)
Physical Therapy Treatment Patient Details Name: Anthony Gould MRN: [id number] DOB: Apr 28, 1933 Today's Date: 02/10/2021   History of Present Illness 87 year old male comes to the hospital with complaints of diarrhea. Dx of c diff.  He was diagnosed recently with acute appendicitis and underwent appendicectomy on 9/12.  He had persistent pain in the right lower quadrant and was readmitted in October with an infected hematoma. with history of chronic kidney disease stage II, HTN, HLD, chronic diastolic CHF, mitral regurgitation status post mechanical MVR on chronic Coumadin, permanent A. fib, pulmonary hypertension, DM2, cirrhosis, melanoma.    PT Comments    Pt up in recliner and requesting assist back to bed.  Pt agreeable to ambulate first however fatigued quickly.  Pt's HR up to 128 bpm during ambulation (RN aware).    Recommendations for follow up therapy are one component of a multi-disciplinary discharge planning process, led by the attending physician.  Recommendations may be updated based on patient status, additional functional criteria and insurance authorization.  Follow Up Recommendations  Home health PT     Assistance Recommended at Discharge Set up Supervision/Assistance  Equipment Recommendations  None recommended by PT    Recommendations for Other Services       Precautions / Restrictions Precautions Precautions: Fall     Mobility  Bed Mobility Overal bed mobility: Needs Assistance Bed Mobility: Sit to Supine       Sit to supine: Mod assist   General bed mobility comments: required assist for LEs onto bed    Transfers Overall transfer level: Needs assistance Equipment used: Rolling walker (2 wheels) Transfers: Sit to/from Stand Sit to Stand: Min assist           General transfer comment: assist to rise and steady, cues for hand placement    Ambulation/Gait Ambulation/Gait assistance: Min guard Gait Distance (Feet): 80 Feet Assistive  device: Rolling walker (2 wheels) Gait Pattern/deviations: Step-through pattern;Decreased stride length Gait velocity: decreased     General Gait Details: verbal cues for RW positioning; pt fatigued quickly, denies SOB, pt with vtach per RN upon returning to bed and HR on telemetry reading 127 bpm however pt asymptomatic   Stairs             Wheelchair Mobility    Modified Rankin (Stroke Patients Only)       Balance                                            Cognition Arousal/Alertness: Awake/alert Behavior During Therapy: WFL for tasks assessed/performed Overall Cognitive Status: Within Functional Limits for tasks assessed                                          Exercises      General Comments        Pertinent Vitals/Pain Pain Assessment: No/denies pain    Home Living                          Prior Function            PT Goals (current goals can now be found in the care plan section) Progress towards PT goals: Progressing toward goals    Frequency    Min 3X/week  PT Plan Current plan remains appropriate    Co-evaluation              AM-PAC PT "6 Clicks" Mobility   Outcome Measure  Help needed turning from your back to your side while in a flat bed without using bedrails?: A Little Help needed moving from lying on your back to sitting on the side of a flat bed without using bedrails?: A Little Help needed moving to and from a bed to a chair (including a wheelchair)?: A Little Help needed standing up from a chair using your arms (e.g., wheelchair or bedside chair)?: A Little Help needed to walk in hospital room?: A Little Help needed climbing 3-5 steps with a railing? : A Lot 6 Click Score: 17    End of Session Equipment Utilized During Treatment: Gait belt Activity Tolerance: Patient limited by fatigue Patient left: in bed;with call bell/phone within reach;with family/visitor  present Nurse Communication: Mobility status PT Visit Diagnosis: Difficulty in walking, not elsewhere classified (R26.2)     Time: 2111-5520 PT Time Calculation (min) (ACUTE ONLY): 17 min  Charges:  $Gait Training: 8-22 mins                    Arlyce Dice, DPT Acute Rehabilitation Services Pager: [phone] Office: [place] 02/10/2021, 3:23 PM

## 2021-02-10 NOTE — Progress Notes (Signed)
PROGRESS NOTE  Anthony Gould EZM:[id number] DOB: 08/16/1933 DOA: 02/04/2021 PCP: Cassandria Anger, MD   LOS: 6 days   Brief Narrative / Interim history: Pleasant 87 year old male with history of chronic kidney disease stage II, HTN, HLD, chronic diastolic CHF, mitral regurgitation status post mechanical MVR on chronic Coumadin, permanent A. fib, pulmonary hypertension, DM2, cirrhosis, melanoma comes to the hospital with complaints of diarrhea.  He was diagnosed recently with acute appendicitis and underwent appendicectomy on 9/12.  He had persistent pain in the right lower quadrant and was readmitted in October with an infected hematoma.  He had an abscess in that area which appeared to be infected.  He had a drain placed, grew E. coli and was treated with antibiotics.  Drain was removed recently.  Ever since his second hospitalization he has been having intermittent diarrhea but recently this is gotten much worse.  He also has been having poor appetite and generalized weakness.  Seen by PT with recommendation for home health PT.  TOC consulted to assist with disposition.  Subjective / 24h Interval events: Reports of poor appetite with no nausea or vomiting.  Denies any abdominal pain.  He had 2 loose stools today, frequency is decreasing.  INR 4.4 from 5.2.  Assessment & Plan: Principal problem Severe sepsis, resolved, due to C. difficile colitis Presented with several weeks of intermittent diarrhea On presentation, he was febrile, dehydrated, hyponatremic, and had significant leukocytosis with WBC 27.  CT testing returned positive. He was initially treated with oral vancomycin and IV metronidazole, however after discussion with pharmacy, they felt like this is interfering with his Coumadin, and he was switched to fidaxomicin on 11/14.  He was approved for patient's assistance and will receive this medication at home as well. Probiotics added, Florastor 250 mg twice daily on  02/09/2021. Leukocytosis has now resolved Afebrile.   Improving but persistent hyponatremia likely secondary to poor solute intake- Serum sodium is uptrending 130 from 128 from 125.   Continue to encourage oral solute intake.  Resolved post repletion: Hypokalemia, hypomagnesemia  Serum potassium and serum magnesium normalized after repletion.  Improving mild non anion gap metabolic acidosis -Likely due to diarrhea and home diuretic use.   Stop fluids but continue to hold diuretics Serum bicarb 21 from 18.  Anion gap 7.   Improving AKI on CKD stage II -Likely prerenal azotemia from dehydration. BUN 25, creatinine 1.6 (baseline 1.0).   He is back to his baseline creatinine 1.0 with GFR greater than 60.  Pleural effusions -CT showing moderate right and small left pleural effusions lying dependently in the areas of passive subsegmental atelectasis in the lower lobes bilaterally.  Patient is not hypoxic at present and has not complained of any shortness of breath no that the fluids were stopped.  We will likely do a thoracentesis prior to discharge once INR better Obtain chest x-ray in the morning.   Chronic diastolic heart failure - Received a dose of IV Lasix on 02/08/2021 and 02/09/2021 Monitor strict I's and O's and daily weight  Mechanical mitral valve -INR currently supratherapeutic, increasing even more from 4.8 >>5.6.  No bleeding, monitor Goal INR between 2.5 and 3.5.    Improving supratherapeutic INR INR 4.4 from 5.2 from 5.1 Continue to trend and repeat INR in the morning  Bilateral lower extremity edema Elevate lower extremities 1 dose of IV Lasix given 20 mg x 1 on 02/09/2011. Repeat dose of IV Lasix on 02/11/2021.   Permanent A. Fib -Currently rate  controlled on bisoprolol.   Diet controlled type 2 diabetes -A1c 4.9   Hypertension -Stable. Continue bisoprolol   Hyperlipidemia -Continue Lipitor   Gout -Continue allopurinol   GERD -stop Protonix given C.  Difficile  Scheduled Meds:  allopurinol  300 mg Oral Daily   atorvastatin  20 mg Oral Once per day on Mon Fri   bisoprolol  2.5 mg Oral QHS   fidaxomicin  200 mg Oral BID   ipratropium-albuterol  3 mL Nebulization BID   latanoprost  1 drop Both Eyes QHS   saccharomyces boulardii  250 mg Oral BID   sodium chloride  1 g Oral TID WC   Warfarin - Pharmacist Dosing Inpatient   Does not apply q1600   Continuous Infusions:   PRN Meds:.acetaminophen **OR** acetaminophen  Diet Orders (From admission, onward)     Start     Ordered   02/07/21 1216  Diet regular Room service appropriate? Yes; Fluid consistency: Thin  Diet effective now       Question Answer Comment  Room service appropriate? Yes   Fluid consistency: Thin      02/07/21 1215            DVT prophylaxis:      Code Status: DNR  Family Communication: None at bedside.  Status is: Inpatient  Remains inpatient appropriate because: Persistent symptoms, persistent electrolyte abnormalities  Level of care: Telemetry  Consultants:  None  Procedures:  none  Microbiology  C. difficile-positive  Antimicrobials: P.o. vancomycin, IV metronidazole 11/12 Dificid 02/06/21 x 10 days   Objective: Vitals:   02/10/21 0535 02/10/21 0651 02/10/21 0859 02/10/21 1157  BP: 123/65 109/63  118/66  Pulse: 88 88  (!) 101  Resp: 20   18  Temp: 98.8 F (37.1 C)   98 F (36.7 C)  TempSrc: Oral   Oral  SpO2: 99%  95% 96%  Weight:      Height:        Intake/Output Summary (Last 24 hours) at 02/10/2021 1658 Last data filed at 02/10/2021 0537 Gross per 24 hour  Intake 120 ml  Output 825 ml  Net -705 ml   Filed Weights   02/05/21 2224  Weight: 79.2 kg    Examination:  Constitutional: Frail-appearing in no acute distress.  He is alert and oriented x3.   Eyes: Anicteric sclerae.   ENMT: Moist mucous membranes. neck: Normal supple. Respiratory: Clear to auscultation with no wheezes or rales.   Cardiovascular:  Regular rate and rhythm no rubs or gallops.  Abdomen: Soft, hypoactive bowel sounds.  Musculoskeletal: Trace lower extremity edema. Skin: No rashes or ulcers. Neurologic: No focal deficit  Data Reviewed: I have independently reviewed following labs and imaging studies   CBC: Recent Labs  Lab 02/04/21 0222 02/05/21 0503 02/06/21 0432 02/07/21 1010 02/08/21 0457 02/09/21 0724 02/10/21 0440  WBC 21.7*   < > 27.3* 23.2* 17.4* 12.9* 9.4  NEUTROABS 20.0*  --   --   --   --   --   --   HGB 10.2*   < > 10.5* 10.7* 10.3* 10.4* 9.7*  HCT 29.6*   < > 30.5* 31.5* 30.5* 31.6* 29.2*  MCV 90.8   < > 91.0 91.8 91.6 92.4 92.1  PLT 232   < > 266 290 250 247 256   < > = values in this interval not displayed.   Basic Metabolic Panel: Recent Labs  Lab 02/04/21 0348 02/04/21 0927 02/04/21 1632 02/05/21 0503 02/06/21 0432 02/07/21 1010  02/08/21 0457 02/09/21 0724 02/10/21 0440  NA  --    < > 123*   < > 124* 124* 125* 128* 130*  K 2.6*   < > 3.8   < > 4.8 4.5 4.4 4.3 4.0  CL  --    < > 97*   < > 97* 98 100 100 102  CO2  --    < > 20*   < > 17* 19* 18* 21* 23  GLUCOSE  --    < > 134*   < > 91 79 81 83 116*  BUN  --    < > 24*   < > 30* 32* 27* 23 21  CREATININE  --    < > 1.30*   < > 1.31* 1.23 1.21 1.06 1.04  CALCIUM  --    < > 7.4*   < > 7.9* 8.0* 7.9* 7.9* 8.0*  MG 1.4*  --  1.6*  --   --   --   --  1.7  --    < > = values in this interval not displayed.   Liver Function Tests: Recent Labs  Lab 02/04/21 0222 02/06/21 0432 02/07/21 1010 02/09/21 0724 02/10/21 0440  AST 24 21 22 22 26   ALT 10 10 10 10 11   ALKPHOS 71 73 159* 100 104  BILITOT 1.3* 0.9 1.0 1.1 0.8  PROT 5.8* 4.7* 5.0* 4.9* 5.0*  ALBUMIN 2.9* 2.2* 2.2* 2.3* 2.2*   Coagulation Profile: Recent Labs  Lab 02/06/21 0432 02/07/21 0436 02/08/21 0457 02/09/21 0443 02/10/21 0440  INR 5.5* 5.6* 5.1* 5.2* 4.4*   HbA1C: No results for input(s): HGBA1C in the last 72 hours.  CBG: No results for input(s): GLUCAP in  the last 168 hours.  Recent Results (from the past 240 hour(s))  Blood Culture (routine x 2)     Status: None   Collection Time: 02/04/21  2:22 AM   Specimen: BLOOD  Result Value Ref Range Status   Specimen Description   Final    BLOOD RIGHT ANTECUBITAL Performed at [facility] [address], [place], [place] [address]    Special Requests   Final    BOTTLES DRAWN AEROBIC AND ANAEROBIC Blood Culture results may not be optimal due to an excessive volume of blood received in culture bottles Performed at [facility] [address], [place], [place] [address]    Culture   Final    NO GROWTH 5 DAYS Performed at [facility], [place] [address], [place], [place] [address]    Report Status 02/09/2021 FINAL  Final  Urine Culture     Status: Abnormal   Collection Time: 02/04/21  2:29 AM   Specimen: In/Out Cath Urine  Result Value Ref Range Status   Specimen Description   Final    IN/OUT CATH URINE Performed at [facility] [address], [place], [place] [address]    Special Requests   Final    Normal Performed at [facility], [place] [address] [place], [place] [address]    Culture (A)  Final    20,000 COLONIES/mL ENTEROCOCCUS FAECIUM 2,000 COLONIES/mL ENTEROCOCCUS FAECALIS    Report Status 02/07/2021 FINAL  Final   Organism ID, Bacteria ENTEROCOCCUS FAECIUM (A)  Final   Organism ID, Bacteria ENTEROCOCCUS FAECALIS (A)  Final      Susceptibility   Enterococcus faecalis - MIC*    AMPICILLIN <=2 SENSITIVE Sensitive     NITROFURANTOIN <=  16 SENSITIVE Sensitive     VANCOMYCIN 1 SENSITIVE Sensitive     * 2,000 COLONIES/mL ENTEROCOCCUS FAECALIS   Enterococcus faecium - MIC*    AMPICILLIN RESISTANT Resistant     NITROFURANTOIN 64 INTERMEDIATE Intermediate     VANCOMYCIN <=0.5 SENSITIVE Sensitive     * 20,000 COLONIES/mL ENTEROCOCCUS FAECIUM  Resp Panel by RT-PCR (Flu A&B, Covid) Nasopharyngeal Swab      Status: None   Collection Time: 02/04/21  2:52 AM   Specimen: Nasopharyngeal Swab; Nasopharyngeal(NP) swabs in vial transport medium  Result Value Ref Range Status   SARS Coronavirus 2 by RT PCR NEGATIVE NEGATIVE Final    Comment: (NOTE) SARS-CoV-2 target nucleic acids are NOT DETECTED.  The SARS-CoV-2 RNA is generally detectable in upper respiratory specimens during the acute phase of infection. The lowest concentration of SARS-CoV-2 viral copies this assay can detect is 138 copies/mL. A negative result does not preclude SARS-Cov-2 infection and should not be used as the sole basis for treatment or other patient management decisions. A negative result may occur with  improper specimen collection/handling, submission of specimen other than nasopharyngeal swab, presence of viral mutation(s) within the areas targeted by this assay, and inadequate number of viral copies(<138 copies/mL). A negative result must be combined with clinical observations, patient history, and epidemiological information. The expected result is Negative.  Fact Sheet for Patients:  EntrepreneurPulse.com.au  Fact Sheet for Healthcare Providers:  IncredibleEmployment.be  This test is no t yet approved or cleared by the [place] FDA and  has been authorized for detection and/or diagnosis of SARS-CoV-2 by FDA under an Emergency Use Authorization (EUA). This EUA will remain  in effect (meaning this test can be used) for the duration of the COVID-19 declaration under Section 564(b)(1) of the Act, 21 U.S.C.section 360bbb-3(b)(1), unless the authorization is terminated  or revoked sooner.       Influenza A by PCR NEGATIVE NEGATIVE Final   Influenza B by PCR NEGATIVE NEGATIVE Final    Comment: (NOTE) The Xpert Xpress SARS-CoV-2/FLU/RSV plus assay is intended as an aid in the diagnosis of influenza from Nasopharyngeal swab specimens and should not be used as a sole basis  for treatment. Nasal washings and aspirates are unacceptable for Xpert Xpress SARS-CoV-2/FLU/RSV testing.  Fact Sheet for Patients: EntrepreneurPulse.com.au  Fact Sheet for Healthcare Providers: IncredibleEmployment.be  This test is not yet approved or cleared by the [place] FDA and has been authorized for detection and/or diagnosis of SARS-CoV-2 by FDA under an Emergency Use Authorization (EUA). This EUA will remain in effect (meaning this test can be used) for the duration of the COVID-19 declaration under Section 564(b)(1) of the Act, 21 U.S.C. section 360bbb-3(b)(1), unless the authorization is terminated or revoked.  Performed at [facility], [place] [address] [place], [place] [address]   Blood Culture (routine x 2)     Status: None   Collection Time: 02/04/21  2:57 AM   Specimen: BLOOD  Result Value Ref Range Status   Specimen Description   Final    BLOOD BLOOD RIGHT HAND Performed at [facility] [address], [place], [place] [address]    Special Requests   Final    BOTTLES DRAWN AEROBIC AND ANAEROBIC Blood Culture adequate volume Performed at [facility] [address] [place], [place] [address]    Culture   Final    NO GROWTH 5 DAYS Performed at [facility], [place] [address] [place], [place]  [address]    Report Status 02/09/2021 FINAL  Final  C Difficile Quick Screen w PCR reflex     Status: Abnormal   Collection Time: 02/04/21  4:04 AM   Specimen: Stool  Result Value Ref Range Status   C Diff antigen POSITIVE (A) NEGATIVE Final   C Diff toxin POSITIVE (A) NEGATIVE Final   C Diff interpretation Toxin producing C. difficile detected.  Final    Comment: CRITICAL RESULT CALLED TO, READ BACK BY AND VERIFIED WITH: Valma Cava RN ON 02/04/2021 @ 1483 BY GOLSONM Performed at [facility] [address] [place], [place] [address]        Radiology Studies: No results found.   Irene Pap, MD [facility]  Between 7 am - 7 pm I am available, please contact me via [place] (for emergencies) or Securechat (non urgent messages)  Between 7 pm - 7 am I am not available, please contact night coverage MD/APP via Amion

## 2021-02-11 ENCOUNTER — Inpatient Hospital Stay (HOSPITAL_COMMUNITY): Payer: Medicare Other

## 2021-02-11 LAB — BASIC METABOLIC PANEL
Anion gap: 5 (ref 5–15)
BUN: 17 mg/dL (ref 8–23)
CO2: 24 mmol/L (ref 22–32)
Calcium: 7.9 mg/dL — ABNORMAL LOW (ref 8.9–10.3)
Chloride: 102 mmol/L (ref 98–111)
Creatinine, Ser: 0.76 mg/dL (ref 0.61–1.24)
GFR, Estimated: >60 mL/min (ref >60)
Glucose, Bld: 120 mg/dL — ABNORMAL HIGH (ref 70–99)
Potassium: 3.8 mmol/L (ref 3.5–5.1)
Sodium: 131 mmol/L — ABNORMAL LOW (ref 135–145)

## 2021-02-11 LAB — PROTIME-INR
INR: 3 — ABNORMAL HIGH (ref 0.8–1.2)
Prothrombin Time: 31.4 seconds — ABNORMAL HIGH (ref 11.4–15.2)

## 2021-02-11 MED ORDER — WARFARIN SODIUM 5 MG PO TABS
7.5000 mg | ORAL_TABLET | Freq: Once | ORAL | Status: AC
  Administered 2021-02-11: 7.5 mg via ORAL
  Filled 2021-02-11: qty 1

## 2021-02-11 NOTE — Progress Notes (Signed)
SATURATION QUALIFICATIONS: (This note is used to comply with regulatory documentation for home oxygen)  Patient Saturations on Room Air at Rest = 95%  Patient Saturations on Room Air while Ambulating = 93 %  Patient Saturations on 0 Liters of oxygen while Ambulating = 93 %  Please briefly explain why patient needs home oxygen: no home O2 needs indicated at this time

## 2021-02-11 NOTE — Progress Notes (Signed)
PROGRESS NOTE  Anthony Gould PRF:[id number] DOB: 09-14-1933 DOA: 02/04/2021 PCP: Cassandria Anger, MD   LOS: 7 days   Brief Narrative / Interim history: Pleasant 87 year old male with history of HTN, HLD, chronic diastolic CHF, mitral regurgitation status post mechanical valve replacement on chronic Coumadin, permanent A. fib, pulmonary hypertension, DM2, cirrhosis, melanoma comes to [facility] ED with complaints of diarrhea.  He was diagnosed recently with acute appendicitis and underwent appendicectomy on 12/05/20.  He had persistent pain in the right lower quadrant and was readmitted in October with an infected hematoma.  He had an abscess in that area which appeared to be infected.  He had a drain placed, grew E. coli and was treated with antibiotics.  Drain was removed recently.  Ever since his second hospitalization he has been having intermittent diarrhea but recently this is gotten much worse.  He also has been having poor appetite and generalized weakness.  Seen by PT with recommendation for home health PT.  TOC consulted to assist with disposition.    Updated the patient's son Mr. Briar Witherspoon, via phone on 02/10/2021, concerned about the patient's poor oral intake and generalized weakness.  Due to concern for possible structural abnormality speech therapist was consulted, patient found to have mild aspiration risk, recommendation for regular consistency diet and thin liquids.  Dietitian also consulted to assist with nutrition needs.  Subjective / 24h Interval events: Patient was seen and examined at his bedside.  He denies any abdominal pain or nausea.  Advised to increase his oral protein calorie intake, he is receptive.  States he finished a whole sandwich his wife brought him yesterday.  Assessment & Plan: Principal problem Severe sepsis, sepsis criteria has resolved, due to C. difficile colitis Presented with several weeks of intermittent diarrhea On presentation, he was febrile,  dehydrated, hyponatremic, and had significant leukocytosis with WBC 27.  C. difficile PCR returned positive. He was initially treated with oral vancomycin and IV metronidazole, however after discussion with pharmacy, they felt like this is interfering with his Coumadin, and he was switched to fidaxomicin on 02/06/21.  He was approved for patient's assistance and will receive this medication at home as well. Probiotics added, Florastor 250 mg twice daily on 02/09/2021. Leukocytosis has now resolved and electrolytes are improving. Currently afebrile.   Improving but persistent hyponatremia likely secondary to poor solute intake- Serum sodium is uptrending 131 from 130 from 128 from 125.   Continue to encourage oral solute intake.  Resolved post repletion: Hypokalemia, hypomagnesemia  Serum potassium and serum magnesium normalized after repletion.  Resolved mild non anion gap metabolic acidosis -Likely due to diarrhea and home diuretic use.     Resolved AKI on CKD stage II -Likely prerenal azotemia from dehydration. BUN 25, creatinine 1.6 (baseline 1.0).   He is back to his baseline creatinine 0.76 with GFR greater than 60.  Pleural effusions -CT showing moderate right and small left pleural effusions lying dependently in the areas of passive subsegmental atelectasis in the lower lobes bilaterally.  Patient is not hypoxic at present and has not complained of any shortness of breath.  Chest x-ray personally reviewed shows a stable exam.  No overt pulmonary edema. Obtain home oxygen evaluation prior to DC.   Chronic diastolic heart failure - Received a dose of IV Lasix on 02/08/2021, 02/09/2021, and 02/10/2021 Continue strict I's and O's and daily weight.  Mechanical mitral valve on Coumadin INR is currently therapeutic 3.0. Goal INR between 2.5 and 3.5.  Resolved supratherapeutic INR INR 3.0 from 4.4 from 5.2 from 5.1  Chronic bilateral lower extremity edema Continue to elevate  lower extremities Resume home regimen.   Permanent A. Fib -Currently rate controlled on bisoprolol. Continue Coumadin for CVA prevention   Diet controlled type 2 diabetes -A1c 4.9   Hypertension -Stable. Continue bisoprolol   Hyperlipidemia -Continue Lipitor   Gout -Continue allopurinol   GERD -stop Protonix given C. Difficile  Scheduled Meds:  allopurinol  300 mg Oral Daily   atorvastatin  20 mg Oral Once per day on Mon Fri   bisoprolol  2.5 mg Oral QHS   fidaxomicin  200 mg Oral BID   ipratropium-albuterol  3 mL Nebulization BID   latanoprost  1 drop Both Eyes QHS   saccharomyces boulardii  250 mg Oral BID   sodium chloride  1 g Oral TID WC   warfarin  7.5 mg Oral ONCE-1600   Warfarin - Pharmacist Dosing Inpatient   Does not apply q1600   Continuous Infusions:   PRN Meds:.acetaminophen **OR** acetaminophen  Diet Orders (From admission, onward)     Start     Ordered   02/07/21 1216  Diet regular Room service appropriate? Yes; Fluid consistency: Thin  Diet effective now       Question Answer Comment  Room service appropriate? Yes   Fluid consistency: Thin      02/07/21 1215            DVT prophylaxis:  warfarin (COUMADIN) tablet 7.5 mg     Code Status: DNR  Family Communication: Updated his son Waunita Schooner via phone.  Status is: Inpatient  Remains inpatient appropriate because: Persistent symptoms, persistent electrolyte abnormalities  Level of care: Telemetry  Consultants:  None  Procedures:  none  Microbiology  C. difficile-positive  Antimicrobials: P.o. vancomycin, IV metronidazole 11/12 Dificid 02/06/21 x 10 days   Objective: Vitals:   02/10/21 2053 02/11/21 0341 02/11/21 0930 02/11/21 1406  BP: 121/68 121/61  119/61  Pulse: 98 75  88  Resp: 16 16  14   Temp: 98.4 F (36.9 C) 97.6 F (36.4 C)  97.7 F (36.5 C)  TempSrc: Oral Oral  Oral  SpO2: 96% 97% 98% 100%  Weight:      Height:        Intake/Output Summary (Last 24 hours) at  02/11/2021 1419 Last data filed at 02/10/2021 2100 Gross per 24 hour  Intake --  Output 800 ml  Net -800 ml   Filed Weights   02/05/21 2224  Weight: 79.2 kg    Examination:  Constitutional: Frail-appearing in no acute distress.  He is alert and oriented x3.  Very hard of hearing.   Eyes: Anicteric sclerae. ENMT: Moist mucous membranes. neck: Normal, supple. Respiratory: Clear to auscultation with no wheezes or rales. Cardiovascular: Regular rate and rhythm no rubs or gallops.   Abdomen: Soft nontender bowel sounds present.  Musculoskeletal: Trace lower extremity edema bilaterally.   Skin: No ulcerative lesions noted. Neurologic: No focal deficits noted.  Data Reviewed: I have independently reviewed following labs and imaging studies   CBC: Recent Labs  Lab 02/06/21 0432 02/07/21 1010 02/08/21 0457 02/09/21 0724 02/10/21 0440  WBC 27.3* 23.2* 17.4* 12.9* 9.4  HGB 10.5* 10.7* 10.3* 10.4* 9.7*  HCT 30.5* 31.5* 30.5* 31.6* 29.2*  MCV 91.0 91.8 91.6 92.4 92.1  PLT 266 290 250 247 425   Basic Metabolic Panel: Recent Labs  Lab 02/04/21 1632 02/05/21 0503 02/07/21 1010 02/08/21 0457 02/09/21 0724 02/10/21 0440  02/11/21 0434  NA 123*   < > 124* 125* 128* 130* 131*  K 3.8   < > 4.5 4.4 4.3 4.0 3.8  CL 97*   < > 98 100 100 102 102  CO2 20*   < > 19* 18* 21* 23 24  GLUCOSE 134*   < > 79 81 83 116* 120*  BUN 24*   < > 32* 27* 23 21 17   CREATININE 1.30*   < > 1.23 1.21 1.06 1.04 0.76  CALCIUM 7.4*   < > 8.0* 7.9* 7.9* 8.0* 7.9*  MG 1.6*  --   --   --  1.7  --   --    < > = values in this interval not displayed.   Liver Function Tests: Recent Labs  Lab 02/06/21 0432 02/07/21 1010 02/09/21 0724 02/10/21 0440  AST 21 22 22 26   ALT 10 10 10 11   ALKPHOS 73 159* 100 104  BILITOT 0.9 1.0 1.1 0.8  PROT 4.7* 5.0* 4.9* 5.0*  ALBUMIN 2.2* 2.2* 2.3* 2.2*   Coagulation Profile: Recent Labs  Lab 02/07/21 0436 02/08/21 0457 02/09/21 0443 02/10/21 0440  02/11/21 0434  INR 5.6* 5.1* 5.2* 4.4* 3.0*   HbA1C: No results for input(s): HGBA1C in the last 72 hours.  CBG: No results for input(s): GLUCAP in the last 168 hours.  Recent Results (from the past 240 hour(s))  Blood Culture (routine x 2)     Status: None   Collection Time: 02/04/21  2:22 AM   Specimen: BLOOD  Result Value Ref Range Status   Specimen Description   Final    BLOOD RIGHT ANTECUBITAL Performed at [facility] [address], [place], [place] [address]    Special Requests   Final    BOTTLES DRAWN AEROBIC AND ANAEROBIC Blood Culture results may not be optimal due to an excessive volume of blood received in culture bottles Performed at [facility] [address], [place], [place] [address]    Culture   Final    NO GROWTH 5 DAYS Performed at [facility], [place] [address], [place], [place] [address]    Report Status 02/09/2021 FINAL  Final  Urine Culture     Status: Abnormal   Collection Time: 02/04/21  2:29 AM   Specimen: In/Out Cath Urine  Result Value Ref Range Status   Specimen Description   Final    IN/OUT CATH URINE Performed at [facility] [address] [place], [place] [address]    Special Requests   Final    Normal Performed at [facility], [place] [address], [place], [place] [address]    Culture (A)  Final    20,000 COLONIES/mL ENTEROCOCCUS FAECIUM 2,000 COLONIES/mL ENTEROCOCCUS FAECALIS    Report Status 02/07/2021 FINAL  Final   Organism ID, Bacteria ENTEROCOCCUS FAECIUM (A)  Final   Organism ID, Bacteria ENTEROCOCCUS FAECALIS (A)  Final      Susceptibility   Enterococcus faecalis - MIC*    AMPICILLIN <=2 SENSITIVE Sensitive     NITROFURANTOIN <=16 SENSITIVE Sensitive     VANCOMYCIN 1 SENSITIVE Sensitive     * 2,000 COLONIES/mL ENTEROCOCCUS FAECALIS   Enterococcus faecium - MIC*    AMPICILLIN RESISTANT Resistant     NITROFURANTOIN 64 INTERMEDIATE  Intermediate     VANCOMYCIN <=0.5 SENSITIVE Sensitive     * 20,000 COLONIES/mL ENTEROCOCCUS FAECIUM  Resp Panel by RT-PCR (Flu A&B, Covid) Nasopharyngeal Swab  Status: None   Collection Time: 02/04/21  2:52 AM   Specimen: Nasopharyngeal Swab; Nasopharyngeal(NP) swabs in vial transport medium  Result Value Ref Range Status   SARS Coronavirus 2 by RT PCR NEGATIVE NEGATIVE Final    Comment: (NOTE) SARS-CoV-2 target nucleic acids are NOT DETECTED.  The SARS-CoV-2 RNA is generally detectable in upper respiratory specimens during the acute phase of infection. The lowest concentration of SARS-CoV-2 viral copies this assay can detect is 138 copies/mL. A negative result does not preclude SARS-Cov-2 infection and should not be used as the sole basis for treatment or other patient management decisions. A negative result may occur with  improper specimen collection/handling, submission of specimen other than nasopharyngeal swab, presence of viral mutation(s) within the areas targeted by this assay, and inadequate number of viral copies(<138 copies/mL). A negative result must be combined with clinical observations, patient history, and epidemiological information. The expected result is Negative.  Fact Sheet for Patients:  EntrepreneurPulse.com.au  Fact Sheet for Healthcare Providers:  IncredibleEmployment.be  This test is no t yet approved or cleared by the [place] FDA and  has been authorized for detection and/or diagnosis of SARS-CoV-2 by FDA under an Emergency Use Authorization (EUA). This EUA will remain  in effect (meaning this test can be used) for the duration of the COVID-19 declaration under Section 564(b)(1) of the Act, 21 U.S.C.section 360bbb-3(b)(1), unless the authorization is terminated  or revoked sooner.       Influenza A by PCR NEGATIVE NEGATIVE Final   Influenza B by PCR NEGATIVE NEGATIVE Final    Comment: (NOTE) The Xpert  Xpress SARS-CoV-2/FLU/RSV plus assay is intended as an aid in the diagnosis of influenza from Nasopharyngeal swab specimens and should not be used as a sole basis for treatment. Nasal washings and aspirates are unacceptable for Xpert Xpress SARS-CoV-2/FLU/RSV testing.  Fact Sheet for Patients: EntrepreneurPulse.com.au  Fact Sheet for Healthcare Providers: IncredibleEmployment.be  This test is not yet approved or cleared by the [place] FDA and has been authorized for detection and/or diagnosis of SARS-CoV-2 by FDA under an Emergency Use Authorization (EUA). This EUA will remain in effect (meaning this test can be used) for the duration of the COVID-19 declaration under Section 564(b)(1) of the Act, 21 U.S.C. section 360bbb-3(b)(1), unless the authorization is terminated or revoked.  Performed at [facility], [place] [address] [place], [place] [address]   Blood Culture (routine x 2)     Status: None   Collection Time: 02/04/21  2:57 AM   Specimen: BLOOD  Result Value Ref Range Status   Specimen Description   Final    BLOOD BLOOD RIGHT HAND Performed at [facility] [address] [place], [place] [address]    Special Requests   Final    BOTTLES DRAWN AEROBIC AND ANAEROBIC Blood Culture adequate volume Performed at [facility] [address] [place], [place] [address]    Culture   Final    NO GROWTH 5 DAYS Performed at [facility], [place] [address], [place], [place] [address]    Report Status 02/09/2021 FINAL  Final  C Difficile Quick Screen w PCR reflex     Status: Abnormal   Collection Time: 02/04/21  4:04 AM   Specimen: Stool  Result Value Ref Range Status   C Diff antigen POSITIVE (A) NEGATIVE Final   C Diff toxin POSITIVE (A) NEGATIVE Final   C Diff interpretation Toxin producing C. difficile detected.  Final  Comment: CRITICAL RESULT CALLED TO, READ BACK BY  AND VERIFIED WITH: Valma Cava RN ON 02/04/2021 @ 1898 BY GOLSONM Performed at [facility] [address], [place], [place] [address]       Radiology Studies: DG CHEST PORT 1 VIEW  Result Date: 02/11/2021 CLINICAL DATA:  Sepsis EXAM: PORTABLE CHEST 1 VIEW COMPARISON:  02/04/2021 FINDINGS: 0608 hours. The cardio pericardial silhouette is enlarged. There is pulmonary vascular congestion without overt pulmonary edema. Stable retrocardiac atelectasis or infiltrate. No substantial effusion. IMPRESSION: Stable exam. Electronically Signed   By: Misty Stanley M.D.   On: 02/11/2021 11:35     Irene Pap, MD [facility]  Between 7 am - 7 pm I am available, please contact me via [place] (for emergencies) or Securechat (non urgent messages)  Between 7 pm - 7 am I am not available, please contact night coverage MD/APP via Amion

## 2021-02-11 NOTE — Evaluation (Signed)
Clinical/Bedside Swallow Evaluation Patient Details  Name: Anthony Gould MRN: [id number] Date of Birth: 1933/06/27  Today's Date: 02/11/2021 Time: SLP Start Time (ACUTE ONLY): 1000 SLP Stop Time (ACUTE ONLY): 5573 SLP Time Calculation (min) (ACUTE ONLY): 30 min  Past Medical History:  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   NSVT (nonsustained ventricular tachycardia)    Peripheral edema    venous insufficiency   Permanent atrial fibrillation ([place])    Pulmonary hypertension ([place])    Syncope    TIA (transient ischemic attack)    while on coumadin   Type II or unspecified type diabetes mellitus without mention of complication, not stated as uncontrolled    lifestyle mangement   Ventricular ectopy    symptomatic   Past Surgical History:  Past Surgical History:  Procedure Laterality Date   CHOLECYSTECTOMY, LAPAROSCOPIC  2003   IR PATIENT EVAL TECH 0-60 MINS  01/09/2021   IR RADIOLOGIST EVAL & MGMT  01/19/2021   IR RADIOLOGIST EVAL & MGMT  02/02/2021   LAPAROSCOPIC APPENDECTOMY N/A 12/05/2020   Procedure: APPENDECTOMY LAPAROSCOPIC;  Surgeon: Stark Klein, MD;  Location: [facility] OR;  Service: General;  Laterality: N/A;   MITRAL VALVE REPLACEMENT     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   HPI:  Patient is an 87 y.o. male with PMH: CKD stage II, hypertension, hyperlipidemia, gout, chronic diastolic heart failure, mitral regurgitation status post mechanical MVR on Coumadin, permanent A. fib, pulmonary hypertension, TIA, type 2 diabetes, cirrhosis, history of melanoma.  Recent history of acute appendicitis status post appendectomy on 9/12. He was admitted again in early October of this year with postop infected hematoma with E. coli and had abcess drain placed and  treated with multiple antibiotics. He was seen in ED on 10/31 for fever and diarrhea and CT completed at that time was concerning for pancolitis. Patient returned to ED on 11/12 for fever and diarrhea which had been occuring for several days prior. CXR sholwed mild atelectasis or infiltrate at the bases, mild blunting of the right costophrenic angle, possible trace pleural effusion. Patient and family reported that he has not been eating much since appendix surgery. SLP ordered to determine if a possible dysphagia was impacting his overall oral intake.    Assessment / Plan / Recommendation  Clinical Impression  Patient exhibited only one instance of immediate cough response when drinking plain/thin water while taking one of his medication tablets. Cough response was brief and no change in vocal quality or vitals and with patient appearing to return to baseline quickly. Patient consumed other medications, saltine cracker, puree solid (applesauce) all without overt s/s aspiration, penetration or oral phase difficulties. Patient's voice was mildly hoarse throughout session and when his son entered room, he did say that patient's voice currently sounds better than it has in a while. Patient's wife and son both asking questions regarding reasoning of his poor appetite, poor PO intake. SLP providing education with main recommendation for patient to drink fluids such as water, avoid dry foods (breads, etc) and focus on foods that have natural moisture, or those that can have moisture added (sauces, gravies, etc). SLP also encouraged family and patient to speak with dietician and/or MD for questions regarding his  caloric and nutritional PO intake. SLP is not recommending further skilled ST intervention at this time. SLP Visit Diagnosis: Dysphagia, unspecified (R13.10)    Aspiration Risk  Mild aspiration risk;No limitations    Diet Recommendation Regular;Thin liquid   Liquid Administration via:  Cup;Straw Medication Administration: Whole meds with liquid Supervision: Patient able to self feed Compensations: Slow rate;Small sips/bites Postural Changes: Seated upright at 90 degrees    Other  Recommendations Oral Care Recommendations: Oral care BID    Recommendations for follow up therapy are one component of a multi-disciplinary discharge planning process, led by the attending physician.  Recommendations may be updated based on patient status, additional functional criteria and insurance authorization.  Follow up Recommendations No SLP follow up      Assistance Recommended at Discharge None  Functional Status Assessment Patient has had a recent decline in their functional status and demonstrates the ability to make significant improvements in function in a reasonable and predictable amount of time.  Frequency and Duration            Prognosis   N/A     Swallow Study   General Date of Onset: 02/04/21 HPI: Patient is an 87 y.o. male with PMH: CKD stage II, hypertension, hyperlipidemia, gout, chronic diastolic heart failure, mitral regurgitation status post mechanical MVR on Coumadin, permanent A. fib, pulmonary hypertension, TIA, type 2 diabetes, cirrhosis, history of melanoma.  Recent history of acute appendicitis status post appendectomy on 9/12. He was admitted again in early October of this year with postop infected hematoma with E. coli and had abcess drain placed and treated with multiple antibiotics. He was seen in ED on 10/31 for fever and diarrhea and CT completed at that time was concerning for pancolitis. Patient returned to ED on 11/12 for fever and diarrhea which had been occuring for several days prior. CXR sholwed mild atelectasis or infiltrate at the bases, mild blunting of the right costophrenic angle, possible trace pleural effusion. Patient and family reported that he has not been eating much since appendix surgery. SLP ordered to determine if a possible dysphagia  was impacting his overall oral intake. Type of Study: Bedside Swallow Evaluation Previous Swallow Assessment: none found Diet Prior to this Study: Regular;Thin liquids Temperature Spikes Noted: No Respiratory Status: Room air History of Recent Intubation: No Behavior/Cognition: Alert;Cooperative;Pleasant mood Oral Cavity Assessment: Within Functional Limits Oral Care Completed by SLP: No Oral Cavity - Dentition: Adequate natural dentition Vision: Functional for self-feeding Self-Feeding Abilities: Able to feed self Patient Positioning: Upright in bed Baseline Vocal Quality: Hoarse Volitional Cough: Strong Volitional Swallow: Able to elicit    Oral/Motor/Sensory Function Overall Oral Motor/Sensory Function: Within functional limits   Ice Chips     Thin Liquid Thin Liquid: Impaired Presentation: Straw Pharyngeal  Phase Impairments: Cough - Immediate Other Comments: one instance of cough response when drinking thin liquids (water) while taking one of his medication tablets    Nectar Thick     Honey Thick     Puree Puree: Within functional limits Presentation: Self Fed   Solid     Solid: Within functional limits Presentation: [place], MA, CCC-SLP Speech Therapy

## 2021-02-11 NOTE — Evaluation (Signed)
Occupational Therapy Evaluation Patient Details Name: Anthony Gould MRN: [id number] DOB: [birth year] Today's Date: 02/11/2021   History of Present Illness 87 year old male comes to the hospital with complaints of diarrhea. Dx of c diff.  He was diagnosed recently with acute appendicitis and underwent appendicectomy on 9/12.  He had persistent pain in the right lower quadrant and was readmitted in October with an infected hematoma. with history of chronic kidney disease stage II, HTN, HLD, chronic diastolic CHF, mitral regurgitation status post mechanical MVR on chronic Coumadin, permanent A. fib, pulmonary hypertension, DM2, cirrhosis, melanoma.   Clinical Impression   [name] is an 87 year old man who presents with generalized weakness and decreased activity tolerance requiring use of RW for ambulation and min guard for mobility and ADLs. Patient will benefit from skilled OT services while in hospital to improve deficits and learn compensatory strategies as needed in order to return to PLOF.  Recommended shower chair for home use and initially frequent supervision. Wife verbalized understanding.      Recommendations for follow up therapy are one component of a multi-disciplinary discharge planning process, led by the attending physician.  Recommendations may be updated based on patient status, additional functional criteria and insurance authorization.   Follow Up Recommendations  Home health OT    Assistance Recommended at Discharge Frequent or constant Supervision/Assistance  Functional Status Assessment  Patient has had a recent decline in their functional status and demonstrates the ability to make significant improvements in function in a reasonable and predictable amount of time.  Equipment Recommendations  Tub/shower seat    Recommendations for Other Services       Precautions / Restrictions Precautions Precautions: Fall Precaution Comments:  incontinent Restrictions Weight Bearing Restrictions: No      Mobility Bed Mobility Overal bed mobility: Needs Assistance Bed Mobility: Supine to Sit     Supine to sit: Min guard;HOB elevated          Transfers Overall transfer level: Needs assistance Equipment used: Rolling walker (2 wheels) Transfers: Sit to/from Stand Sit to Stand: Min guard           General transfer comment: min guard for standing and ambulation in room and in hall with RW.      Balance Overall balance assessment: No apparent balance deficits (not formally assessed)                                         ADL either performed or assessed with clinical judgement   ADL Overall ADL's : Needs assistance/impaired Eating/Feeding: Independent   Grooming: Min guard;Standing   Upper Body Bathing: Set up;Sitting   Lower Body Bathing: Set up;Min guard   Upper Body Dressing : Set up;Sitting   Lower Body Dressing: Min guard;Sit to/from stand   Toilet Transfer: Min guard;Regular Toilet;Grab bars;Ambulation;Rolling walker (2 wheels)   Toileting- Clothing Manipulation and Hygiene: Min guard;Sit to/from stand   Tub/ Banker: Min guard   Functional mobility during ADLs: Min guard;Rolling walker (2 wheels)       Vision Patient Visual Report: No change from baseline Vision Assessment?: No apparent visual deficits     Perception     Praxis      Pertinent Vitals/Pain Pain Assessment: No/denies pain     Hand Dominance Right   Extremity/Trunk Assessment Upper Extremity Assessment Upper Extremity Assessment: Overall WFL for tasks assessed  Lower Extremity Assessment Lower Extremity Assessment: Defer to PT evaluation   Cervical / Trunk Assessment Cervical / Trunk Assessment: Normal   Communication Communication Communication: HOH   Cognition Arousal/Alertness: Awake/alert Behavior During Therapy: WFL for tasks assessed/performed Overall Cognitive Status:  Within Functional Limits for tasks assessed                                 General Comments: hoh     General Comments       Exercises     Shoulder Instructions      Home Living Family/patient expects to be discharged to:: Private residence Living Arrangements: Spouse/significant other Available Help at Discharge: Family;Available 24 hours/day Type of Home: House Home Access: Level entry     Home Layout: Two level;Bed/bath upstairs   Alternate Level Stairs-Rails: Right;Left Bathroom Shower/Tub: Teacher, early years/pre: Standard     Home Equipment: Conservation officer, nature (2 wheels);Cane - single point;Shower seat   Additional Comments: does not use DME; also has stair lift if needed      Prior Functioning/Environment Prior Level of Function : Independent/Modified Independent                        OT Problem List: Decreased activity tolerance;Decreased strength;Decreased knowledge of use of DME or AE      OT Treatment/Interventions: Self-care/ADL training;Therapeutic exercise;DME and/or AE instruction;Therapeutic activities;Balance training;Patient/family education    OT Goals(Current goals can be found in the care plan section) Acute Rehab OT Goals Patient Stated Goal: to regain independence OT Goal Formulation: With patient Time For Goal Achievement: 02/25/21 Potential to Achieve Goals: Good  OT Frequency: Min 2X/week   Barriers to D/C:            Co-evaluation              AM-PAC OT "6 Clicks" Daily Activity     Outcome Measure Help from another person eating meals?: None Help from another person taking care of personal grooming?: A Little Help from another person toileting, which includes using toliet, bedpan, or urinal?: A Little Help from another person bathing (including washing, rinsing, drying)?: A Little Help from another person to put on and taking off regular upper body clothing?: A Little Help from another  person to put on and taking off regular lower body clothing?: A Little 6 Click Score: 19   End of Session Equipment Utilized During Treatment: Rolling walker (2 wheels) Nurse Communication: Mobility status  Activity Tolerance: Patient tolerated treatment well Patient left: in chair;with call bell/phone within reach  OT Visit Diagnosis: Muscle weakness (generalized) (M62.81)                Time: 1432-1500 OT Time Calculation (min): 28 min Charges:  OT General Charges $OT Visit: 1 Visit OT Evaluation $OT Eval Low Complexity: 1 Low OT Treatments $Therapeutic Activity: 8-22 mins  Lavaya Defreitas, OTR/L [place]  Office [phone] Pager: [phone]   Lenward Chancellor 02/11/2021, 4:34 PM

## 2021-02-11 NOTE — Progress Notes (Addendum)
[place] for Warfarin Indication: Afib and MVR  Allergies  Allergen Reactions   Diltiazem Other (See Comments)    "Bradycardia," per pt; tolerates Amlodipine   Sulfa Antibiotics Rash and Other (See Comments)    Reaction not fully recalled    Sulfonamide Derivatives Rash and Other (See Comments)    Reaction not fully recalled    Patient Measurements: Height: 5\' 9"  (175.3 cm) Weight: 79.2 kg (174 lb 9.7 oz) IBW/kg (Calculated) : 70.7 Heparin Dosing Weight: 74.5 kg  Vital Signs: Temp: 97.6 F (36.4 C) (11/19 0341) Temp Source: Oral (11/19 0341) BP: 121/61 (11/19 0341) Pulse Rate: 75 (11/19 0341)  Labs: Recent Labs    02/09/21 0443 02/09/21 0724 02/10/21 0440 02/11/21 0434  HGB  --  10.4* 9.7*  --   HCT  --  31.6* 29.2*  --   PLT  --  247 256  --   LABPROT 47.8*  --  41.6* 31.4*  INR 5.2*  --  4.4* 3.0*  CREATININE  --  1.06 1.04 0.76   Estimated Creatinine Clearance: 67.5 mL/min (by C-G formula based on SCr of 0.76 mg/dL).  Medications:  Scheduled:   allopurinol  300 mg Oral Daily   atorvastatin  20 mg Oral Once per day on Mon Fri   bisoprolol  2.5 mg Oral QHS   fidaxomicin  200 mg Oral BID   ipratropium-albuterol  3 mL Nebulization BID   latanoprost  1 drop Both Eyes QHS   saccharomyces boulardii  250 mg Oral BID   sodium chloride  1 g Oral TID WC   Warfarin - Pharmacist Dosing Inpatient   Does not apply q1600   Assessment: Admit for severe Cdiff colitis, started on Vancomycin po as outpatient. On Warfarin PTA for Afib & MVR - 5 mg daily, except 2.5 mg MF, with last dose 11/11.  Today, 02/11/2021: INR therapeutic today at 3.0, however at lower end of goal Anticipating further INR decrease since warfarin doses held and Flagyl has been d/c'd CBC: Hgb low stable, platelets wnl and stable No bleeding issues reported Flagyl d/c 11/14 due to interaction with warfarin  Started Dificid 11/14 for Cdiff tx  Goal of Therapy:   INR goal 3-3.5 per outpatient clinic note Monitor platelets by anticoagulation protocol: Yes  Plan: Warfarin 7.5mg  PO x1 today to help augment INR Daily INR CBC at least q72 hr while on warfarin Monitor for signs of bleeding or thrombosis  Dimple Nanas, PharmD 02/11/2021 9:02 AM

## 2021-02-12 LAB — PROTIME-INR
INR: 2.9 — ABNORMAL HIGH (ref 0.8–1.2)
Prothrombin Time: 30.4 seconds — ABNORMAL HIGH (ref 11.4–15.2)

## 2021-02-12 MED ORDER — SACCHAROMYCES BOULARDII 250 MG PO CAPS: 250.0000 mg | ORAL_CAPSULE | Freq: Two times a day (BID) | ORAL | 0 refills | Status: DC

## 2021-02-12 MED ORDER — SODIUM CHLORIDE 1 G PO TABS: 1.0000 g | ORAL_TABLET | Freq: Two times a day (BID) | ORAL | 0 refills | Status: DC

## 2021-02-12 MED ORDER — FIDAXOMICIN 200 MG PO TABS: 200.0000 mg | ORAL_TABLET | Freq: Two times a day (BID) | ORAL | 0 refills | Status: AC

## 2021-02-12 MED ORDER — WARFARIN SODIUM 5 MG PO TABS: 5.0000 mg | ORAL_TABLET | Freq: Once | ORAL | Status: DC

## 2021-02-12 NOTE — TOC Transition Note (Signed)
Transition of Care [facility]) - CM/SW Discharge Note   Patient Details  Name: Anthony Gould MRN: [id number] Date of Birth: 20-Feb-1934  Transition of Care [facility]) CM/SW Contact:  Ross Ludwig, LCSW Phone Number: 02/12/2021, 1:25 PM   Clinical Narrative:     Patient will be going home with home health PT [facility].  CSW signing off please reconsult with any other social work needs, home health agency has been notified of planned discharge.    Final next level of care: [place] Barriers to Discharge: Barriers Resolved   Patient Goals and CMS Choice Patient states their goals for this hospitalization and ongoing recovery are:: To return back home with home health. CMS Medicare.gov Compare Post Acute Care list provided to:: Patient Choice offered to / list presented to : Patient  Discharge Placement  Patient discharging back home with home health.                     Discharge Plan and Services   Discharge Planning Services: CM Consult                      HH Arranged: PT [facility] Agency: [facility] Date [facility] Agency Contacted: 02/12/21 Time [place]: 1975 Representative spoke with at [place]: [place] ([place]) Interventions     Readmission Risk Interventions Readmission Risk Prevention Plan 02/07/2021  Transportation Screening Complete  Medication Review Press photographer) Complete  PCP or Specialist appointment within 3-5 days of discharge Complete  HRI or [place] Complete  SW Recovery Care/Counseling Consult Complete  [place] Not Applicable  Some recent data might be hidden

## 2021-02-12 NOTE — Discharge Summary (Signed)
Discharge Summary  Anthony Gould XTK:[id number] DOB: December 27, 1933  PCP: Cassandria Anger, MD  Admit date: 02/04/2021 Discharge date: 02/12/2021  Time spent: 35 minutes.  Recommendations for Outpatient Follow-up:  Follow-up with your primary care provider within a week Follow-up with your cardiologist within a week Take your medications as prescribed Continue PT OT with assistance and fall precautions   Discharge Diagnoses:  Active Hospital Problems   Diagnosis Date Noted   Sepsis [facility]) 02/04/2021   Hyponatremia 12/30/2020   Hypomagnesemia 04/02/2019   Hyperlipidemia 01/31/2007   Essential hypertension 01/31/2007    Resolved Hospital Problems  No resolved problems to display.    Discharge Condition: Stable  Diet recommendation: Resume previous diet.  Vitals:   02/12/21 0336 02/12/21 0916  BP: (!) 117/56   Pulse: 73   Resp: 16   Temp: 97.6 F (36.4 C)   SpO2: 100% 100%    History of present illness:  Pleasant 87 year old male with history of HTN, HLD, chronic diastolic CHF, mitral regurgitation status post mechanical valve replacement on chronic Coumadin, permanent A. fib, pulmonary hypertension, DM2, cirrhosis, melanoma comes to [facility] ED with complaints of diarrhea.  He was diagnosed recently with acute appendicitis and underwent appendicectomy on 12/05/20.  He had persistent pain in the right lower quadrant and was readmitted in October with an infected hematoma.  He had an abscess in that area which appeared to be infected.  He had a drain placed, grew E. coli and was treated with antibiotics.  Drain was removed recently.  Ever since his second hospitalization he has been having intermittent diarrhea but recently this is gotten much worse.  He also has been having poor appetite and generalized weakness.    Admitted for severe sepsis secondary to C. difficile colitis.  He was initially started on p.o. vancomycin and IV Flagyl.  This regimen was changed to Dificid on  02/06/2021 as the previous regimen was interfering with his Coumadin.  He was seen by PT with recommendation for home health PT.  TOC consulted to assist with disposition.    On 02/11/2021 patient had 1 mushy bowel movement in a 24-hour period.  On 02/12/2021 he had 2 bowel movements, mushy in consistency.  He denied any abdominal pain or nausea.  Tolerating a diet and is eager to go home.  02/12/2021: Patient seen at bedside.  He has no new complaints.    Hospital Course:  Principal Problem:   Sepsis ([place]) Active Problems:   Hyperlipidemia   Essential hypertension   Hypomagnesemia   Hyponatremia  Severe sepsis, sepsis criteria has resolved, due to C. difficile colitis Presented with several weeks of intermittent diarrhea On presentation, he was febrile, dehydrated, hyponatremic, and had significant leukocytosis with WBC 27.  C. difficile PCR returned positive. He was initially treated with oral vancomycin and IV metronidazole, however after discussion between the previous provider and pharmacy, they felt like this was interfering with his Coumadin, and he was switched to fidaxomicin on 02/06/21.  He was approved for patient's assistance and received this medication at home as well. Probiotics added, Florastor 250 mg twice daily on 02/09/2021. Leukocytosis has been resolved and he is afebrile.  Nonseptic appearing.   Improving hyponatremia likely secondary to poor solute intake- Serum sodium is uptrending 131 from 130 from 128 from 125.   Continue salt tablets Continue to encourage oral solute intake.  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  Patient is not hypoxic at present and has not complained of any shortness  of breath.  Chest x-ray personally reviewed shows a stable exam.  No overt pulmonary edema. Oxygen saturation 100% on room air. Home oxygen oxygen saturation 93% on room air while ambulating.   Chronic diastolic heart failure - Received a dose of IV Lasix on 02/08/2021, 02/09/2021, and 02/10/2021 Resume home regimen Follow-up with your cardiologist   Mechanical mitral valve on Coumadin INR is 2.9 Continue home regimen Follow-up with your cardiologist   Resolved supratherapeutic INR INR 2.9 from 3.0 from 4.4 from 5.2 from 5.1   Chronic bilateral lower extremity edema Continue to elevate lower extremities Resume home regimen.   Permanent A. Fib -Currently rate controlled on bisoprolol. Continue Coumadin for CVA prevention   Diet controlled type 2 diabetes -A1c 4.9   Hypertension -Stable. Continue bisoprolol.  Hold off home Norvasc and enalapril.  Follow-up with your primary care provider or cardiologist.   Hyperlipidemia -Continue Lipitor   Gout -Continue allopurinol   GERD -stop Protonix given C. Difficile     Procedures: None  Consultations: None  Discharge Exam: BP (!) 117/56 (BP Location: Right Arm)   Pulse 73   Temp 97.6 F (36.4 C) (Oral)   Resp 16   Ht 5\' 9"  (1.753 m)   Wt 79.2 kg   SpO2 100%   BMI 25.78 kg/m  General: 87 y.o. year-old male well developed well nourished in no acute distress.  Alert and oriented x3. Cardiovascular: Regular rate and rhythm with no rubs or gallops.  No thyromegaly or JVD noted.   Respiratory: Clear to auscultation with no wheezes or rales. Good inspiratory effort. Abdomen: Soft nontender nondistended with normal bowel sounds x4 quadrants. Musculoskeletal: No lower extremity edema. 2/4 pulses in all 4 extremities. Skin: No ulcerative lesions noted or rashes, Psychiatry: Mood is appropriate for condition and setting  Discharge Instructions You were cared for by a hospitalist during your hospital stay. If you have any  questions about your discharge medications or the care you received while you were in the hospital after you are discharged, you can call the unit and asked to speak with the hospitalist on call if the hospitalist that took care of you is not available. Once you are discharged, your primary care physician will handle any further medical issues. Please note that NO REFILLS for any discharge medications will be authorized once you are discharged, as it is imperative that you return to your primary care physician (or establish a relationship with a primary care physician if you do not have one) for your aftercare needs so that they can reassess your need for medications and monitor your lab values.   Allergies as of 02/12/2021       Reactions   Diltiazem Other (See Comments)   "Bradycardia," per pt; tolerates Amlodipine   Sulfa Antibiotics Rash, Other (See Comments)   Reaction not fully recalled    Sulfonamide Derivatives Rash, Other (See Comments)   Reaction not fully recalled         Medication List     STOP taking these medications    amLODipine 5 MG tablet Commonly known as: NORVASC   amoxicillin-clavulanate 875-125 MG tablet Commonly known as: AUGMENTIN   enalapril 20 MG tablet Commonly known as: VASOTEC   Milk Thistle  1000 MG Caps   pantoprazole 40 MG tablet Commonly known as: PROTONIX   vancomycin 125 MG capsule Commonly known as: VANCOCIN       TAKE these medications    acetaminophen 500 MG tablet Commonly known as: TYLENOL Take 500 mg by mouth every 6 (six) hours as needed for mild pain or headache.   allopurinol 300 MG tablet Commonly known as: ZYLOPRIM TAKE 1 TABLET BY MOUTH DAILY AS NEEDED. GENERIC EQUIVALENT FOR ZYLOPRIM What changed:  how much to take how to take this when to take this additional instructions   aspirin 81 MG EC tablet Take 81 mg by mouth daily.   atorvastatin 20 MG tablet Commonly known as: LIPITOR TAKE 1 TABLET BY MOUTH TWO  TIMES A WEEK What changed:  how much to take how to take this when to take this additional instructions   BD PosiFlush 0.9 % Soln injection Generic drug: sodium chloride flush Use 5 MLs to flush the drain daily   bisoprolol 5 MG tablet Commonly known as: ZEBETA Take 1 tablet (5 mg total) by mouth daily. Please keep upcoming appt for future refills. Thank you What changed:  how much to take when to take this   dipyridamole 50 MG tablet Commonly known as: PERSANTINE Take 1 tablet (50 mg total) by mouth 3 (three) times daily.   feeding supplement Liqd Take 237 mLs by mouth 2 (two) times daily between meals.   ferrous [id number] C-folic acid capsule Commonly known as: TRINSICON / FOLTRIN Take 1 capsule by mouth 2 (two) times daily after a meal.   fidaxomicin 200 MG Tabs tablet Commonly known as: DIFICID Take 1 tablet (200 mg total) by mouth 2 (two) times daily for 4 days.   furosemide 40 MG tablet Commonly known as: LASIX TAKE 1 TABLET BY MOUTH DAILY GENERIC EQUIVALENT FOR LASIX What changed: See the new instructions.   latanoprost 0.005 % ophthalmic solution Commonly known as: XALATAN Place 1 drop into both eyes at bedtime.   saccharomyces boulardii 250 MG capsule Commonly known as: FLORASTOR Take 1 capsule (250 mg total) by mouth 2 (two) times daily.   sodium chloride 1 g tablet Take 1 tablet (1 g total) by mouth 2 (two) times daily with a meal for 14 days.   Vitamin D3 50 MCG (2000 UT) capsule Take 1 capsule (2,000 Units total) by mouth daily.   warfarin 5 MG tablet Commonly known as: COUMADIN Take as directed. If you are unsure how to take this medication, talk to your nurse or doctor. Original instructions: TAKE 1 TABLET BY MOUTH AS DIRECTED BY THE COUMADIN CLINIC What changed: See the new instructions.       Allergies  Allergen Reactions   Diltiazem Other (See Comments)    "Bradycardia," per pt; tolerates Amlodipine   Sulfa Antibiotics Rash  and Other (See Comments)    Reaction not fully recalled    Sulfonamide Derivatives Rash and Other (See Comments)    Reaction not fully recalled     Follow-up Information     [facility] [place] Follow up.   Why: [name] from [facility] will contact you to schedule your first visit.  If you have any questions her number is [phone].                 The results of significant diagnostics from this hospitalization (including imaging, microbiology, ancillary and laboratory) are listed below for reference.    Significant Diagnostic Studies: CT ABDOMEN PELVIS W CONTRAST  Result Date: 02/04/2021 CLINICAL DATA:  87 year old male with history of nonlocalized abdominal pain. Diarrhea for 1 week. Fever. History of C difficile infection. EXAM: CT ABDOMEN AND PELVIS WITH CONTRAST TECHNIQUE: Multidetector CT imaging of the abdomen and pelvis was performed using the standard protocol following bolus administration of intravenous contrast. CONTRAST:  95mL OMNIPAQUE IOHEXOL 350 MG/ML SOLN COMPARISON:  CT the abdomen and pelvis 01/23/2021. FINDINGS: Lower chest: Cardiomegaly with biatrial dilatation. Mechanical mitral valve. Moderate right and small left pleural effusions lying dependently with associated areas of passive subsegmental atelectasis in the lower lobes of the lungs bilaterally. Hepatobiliary: Liver has a shrunken appearance and nodular contour, indicative of underlying cirrhosis. No discrete cystic or solid hepatic lesions. Small calcified granuloma in segment 8 of the liver incidentally noted. No intra or extrahepatic biliary ductal dilatation. Status post cholecystectomy. Pancreas: Diffuse pancreatic atrophy. No pancreatic mass. No pancreatic ductal dilatation. No pancreatic or peripancreatic fluid collections or inflammatory changes. Spleen: Unremarkable. Adrenals/Urinary Tract: Mild scarring in the upper pole of the right kidney. Left kidney and bilateral adrenal glands are otherwise  normal in appearance. No hydroureteronephrosis. Urinary bladder is normal in appearance. Stomach/Bowel: The appearance of the stomach is normal. No pathologic dilatation of small bowel or colon. Diffuse thickening of the wall in the colon and rectum where there is also mucosal hyperenhancement and some associated hypervascularity in the corresponding mesocolon and meso rectum, indicative of proctocolitis. Air-fluid levels are present throughout the colon. Status post appendectomy. Vascular/Lymphatic: Aortic atherosclerosis, without evidence of aneurysm or dissection in the abdominal or pelvic vasculature. No lymphadenopathy noted in the abdomen or pelvis. Reproductive: Prostate gland and seminal vesicles are unremarkable in appearance. Other: Trace volume of fluid in the peritoneal cavity. Previously noted right lower quadrant pigtail drainage catheter has been removed. A trace amount of fluid tracks into the anterior abdominal wall at the site of the prior catheter placement, presumably along the catheter tract. Trace amount of extraluminal gas and fluid in the right lower quadrant measuring 3.1 x 1.0 cm at the site of the catheter placement (axial image 47 of series 2), presumably within the previously evacuated abscess cavity. No pneumoperitoneum. Musculoskeletal: There are no aggressive appearing lytic or blastic lesions noted in the visualized portions of the skeleton. IMPRESSION: 1. Persistent evidence of proctocolitis, compatible with known C difficile infection. 2. Status post removal of previously noted right lower quadrant pigtail drainage catheter, with small residual evacuated abscess cavity and small amount of fluid tracking to the skin surface presumably along the course of the previously removed catheter. Attention on follow-up studies is recommended to ensure complete resolution of these findings. 3. Morphologic changes in the liver indicative of underlying cirrhosis. 4. Severe diffuse pancreatic  atrophy. 5. Cardiomegaly with biatrial dilatation. 6. Moderate right and small left pleural effusions lying dependently with areas of passive subsegmental atelectasis in the lower lobes of the lungs bilaterally. 7. Additional incidental findings, as above. Electronically Signed   By: Vinnie Langton M.D.   On: 02/04/2021 06:02   CT ABDOMEN PELVIS W CONTRAST  Result Date: 01/23/2021 CLINICAL DATA:  Abdominal pain. Appendectomy. Recent right lower quadrant abscess drain placement. EXAM: CT ABDOMEN AND PELVIS WITH CONTRAST TECHNIQUE: Multidetector CT imaging of the abdomen and pelvis was performed using the standard protocol following bolus administration of intravenous contrast. CONTRAST:  27mL OMNIPAQUE IOHEXOL 350 MG/ML SOLN COMPARISON:  CT abdomen and pelvis 01/19/2021. FINDINGS: Lower chest: The heart is enlarged, unchanged. There are stable small bilateral pleural effusions with bilateral lower  lobe atelectasis. Hepatobiliary: Nodular liver contour is unchanged. No focal liver abnormality is seen. Status post cholecystectomy. No biliary dilatation. Pancreas: There are few punctate pancreatic calcifications compatible with chronic pancreatitis. No acute inflammatory stranding identified. Spleen: Normal in size without focal abnormality. Adrenals/Urinary Tract: There is scarring in the right kidney. There is no hydronephrosis or perinephric fluid. Adrenal glands are within normal limits. Bladder is within normal limits. Stomach/Bowel: There is diffuse wall thickening and enhancement of the entire colon to the level of the rectum. There is mild diffuse surrounding inflammatory stranding. There is also mild wall thickening of the distal ileum and terminal ileum. There is no bowel obstruction. Otherwise, small bowel and stomach are within normal limits. No pneumatosis. Appendix is surgically absent. Vascular/Lymphatic: Aortic atherosclerosis. No enlarged abdominal or pelvic lymph nodes. Reproductive: Prostate is  unremarkable. Other: Percutaneous right lower quadrant drain is unchanged in position. There is minimal stranding surrounding the tip of the drainage catheter. There is no residual fluid collection identified. No new fluid collections are identified. There is a small amount of ascites, new from prior. There is a small fat containing umbilical hernia. Musculoskeletal: Degenerative changes affect the spine IMPRESSION: 1. Nonspecific pancolitis.  No bowel obstruction or pneumatosis. 2. Enteritis involving distal ileum and terminal ileum. 3. New small volume ascites. 4. Percutaneous right lower quadrant drainage catheter in place. No residual fluid collection. No new fluid collections. 5. Cirrhosis. 6. Stable bilateral pleural effusions. 7.  Aortic Atherosclerosis (ICD10-I70.0). Electronically Signed   By: Ronney Asters M.D.   On: 01/23/2021 17:27   CT ABDOMEN PELVIS W CONTRAST  Result Date: 01/19/2021 CLINICAL DATA:  History of intra-abdominal abscess following appendectomy status post percutaneous drain placement on 12/31/2020. He presents for initial follow-up evaluation. EXAM: CT ABDOMEN AND PELVIS WITH CONTRAST TECHNIQUE: Multidetector CT imaging of the abdomen and pelvis was performed using the standard protocol following bolus administration of intravenous contrast. CONTRAST:  143mL ISOVUE-300 IOPAMIDOL (ISOVUE-300) INJECTION 61% COMPARISON:  Prior CT abdomen/pelvis 12/29/2020 FINDINGS: Lower chest: Cardiomegaly with right heart enlargement. Moderate bilateral layering pleural effusions slightly larger on the right than the left. Associated lower lobe atelectasis. Hepatobiliary: Cirrhotic liver. No enhancing liver mass. Surgical changes of prior cholecystectomy. No biliary ductal dilatation. Pancreas: Dystrophic calcifications scattered throughout the pancreas consistent with sequelae of chronic pancreatitis. Spleen: Normal in size without focal abnormality. Adrenals/Urinary Tract: Adrenal glands are  unremarkable. Kidneys are normal, without renal calculi, focal lesion, or hydronephrosis. Bladder is unremarkable. Stomach/Bowel: Surgical changes of appendectomy. No focal bowel wall thickening or evidence of obstruction. Vascular/Lymphatic: Calcifications throughout the abdominal aorta. No aneurysm or dissection. No suspicious lymphadenopathy. Reproductive: Unremarkable. Other: Right lower quadrant percutaneous drainage catheter well positioned within the decreasing right lower quadrant abscess/hematoma. The collection now measures 2.4 x 2.2 x 1.9 cm (volume = 5.3 cm^3) compared to 6.4 x 6.6 x 5.8 cm (volume = 130 cm^3). Of note, the drainage catheter rests immediately adjacent to loops of small bowel. Small volume free fluid present within the anatomic pelvis. Musculoskeletal: No acute fracture or aggressive appearing lytic or blastic osseous lesion. IMPRESSION: 1. Well-positioned drainage catheter within a markedly decreased right lower quadrant abscess cavity. The residual abscess volume is now approximately 5 mL compared to 130 mL just prior to drain placement. 2. Of note, the drainage catheter is directly adjacent to and abutting loops of small bowel in the right lower quadrant. 3. Cardiomegaly with right heart enlargement. 4. Moderate bilateral layering pleural effusions. 5. Additional ancillary findings as above without  significant interval change. Aortic Atherosclerosis (ICD10-I70.0). Electronically Signed   By: Jacqulynn Cadet M.D.   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 EXAM: INJECTION OF PERCUTANEOUS DRAINAGE CATHETER UNDER FLUOROSCOPY MEDICATIONS: None ANESTHESIA/SEDATION: None CONTRAST:  8 mL Isovue 300 FLUOROSCOPY TIME:  18 seconds.  2.0  mGy. COMPLICATIONS: None immediate. PROCEDURE: The percutaneous drainage catheter was injected under fluoroscopy and fluoroscopic images saved. The drain was then cut and removed in its entirety. FINDINGS: Contrast injection demonstrates no significant abscess cavity with contrast filling the drain and immediate space conforming to the pigtail shape of the distal drain. Contrast refluxes along the drainage catheter itself and exits the catheter exit site. No further demonstration of fistula to the adjacent small bowel. 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 No further abscess cavity. Given findings, the drainage catheter was removed after the drain injection procedure. Electronically Signed   By: Aletta Edouard M.D.   On: 02/02/2021 16:38   DG Sinus/Fist Tube Chk-Non GI  Result Date: 01/19/2021 INDICATION: Right lower quadrant abscess status post laparoscopic appendectomy. Drainage catheter in place. EXAM: Drain injection MEDICATIONS: None. ANESTHESIA/SEDATION: None. COMPLICATIONS: None immediate. PROCEDURE: Well-positioned drainage catheter over the right lower quadrant. Contrast injection demonstrates a collapsed abscess cavity with immediate fistulization into adjacent loops of small bowel in the right lower quadrant. IMPRESSION: 1. Positive for fistulous communication between the nearly resolved abscess cavity and the adjacent small bowel. Electronically Signed   By: Jacqulynn Cadet M.D.   On: 01/19/2021 14:10   DG CHEST PORT 1 VIEW  Result Date: 02/11/2021 CLINICAL DATA:  Sepsis EXAM: PORTABLE CHEST 1 VIEW COMPARISON:  02/04/2021 FINDINGS: 0608 hours. The cardio pericardial silhouette is enlarged. There is pulmonary vascular congestion without overt pulmonary edema. Stable retrocardiac atelectasis or infiltrate. No substantial effusion. IMPRESSION: Stable exam. Electronically Signed   By: Misty Stanley M.D.   On: 02/11/2021 11:35   DG Chest Port  1 View  Result Date: 02/04/2021 CLINICAL DATA:  Possible sepsis. EXAM: PORTABLE CHEST 1 VIEW COMPARISON:  12/29/2020. FINDINGS: The heart is markedly enlarged and the prostatic cardiac valve is noted. There is atherosclerotic calcification of the aorta. The pulmonary vasculature is within normal limits. Mild airspace disease is noted at the lung bases bilaterally. There is blunting of the right costophrenic angle, possible small pleural effusion. No pneumothorax. No acute osseous abnormality. Sternotomy wires are present over the midline. IMPRESSION: 1. Cardiomegaly. 2. Mild atelectasis or infiltrate at the lung bases. 3. Mild blunting of the costophrenic angle, possible trace pleural effusion. Electronically Signed   By: Brett Fairy M.D.   On: 02/04/2021 02:58   IR Radiologist Eval & Mgmt  Result Date: 02/02/2021 Please refer to notes tab for details about interventional procedure. (Op Note)  IR Radiologist Eval & Mgmt  Result Date: 01/19/2021 Please refer to notes tab for details about interventional procedure. (Op Note)   Microbiology: Recent Results (from the past 240 hour(s))  Blood Culture (routine x 2)     Status: None   Collection Time: 02/04/21  2:22 AM   Specimen: BLOOD  Result Value Ref Range Status   Specimen Description   Final    BLOOD RIGHT ANTECUBITAL Performed at [facility] [address] [place], [place] [address]  Special Requests   Final    BOTTLES DRAWN AEROBIC AND ANAEROBIC Blood Culture results may not be optimal due to an excessive volume of blood received in culture bottles Performed at [facility] [address], [place], [place] [address]    Culture   Final    NO GROWTH 5 DAYS Performed at [facility], [place] [address] [place], [place] [address]    Report Status 02/09/2021 FINAL  Final  Urine Culture     Status: Abnormal   Collection Time: 02/04/21  2:29 AM   Specimen: In/Out Cath Urine  Result Value  Ref Range Status   Specimen Description   Final    IN/OUT CATH URINE Performed at [facility] [address] [place], [place] [address]    Special Requests   Final    Normal Performed at [facility], [place] [address], [place], [place] [address]    Culture (A)  Final    20,000 COLONIES/mL ENTEROCOCCUS FAECIUM 2,000 COLONIES/mL ENTEROCOCCUS FAECALIS    Report Status 02/07/2021 FINAL  Final   Organism ID, Bacteria ENTEROCOCCUS FAECIUM (A)  Final   Organism ID, Bacteria ENTEROCOCCUS FAECALIS (A)  Final      Susceptibility   Enterococcus faecalis - MIC*    AMPICILLIN <=2 SENSITIVE Sensitive     NITROFURANTOIN <=16 SENSITIVE Sensitive     VANCOMYCIN 1 SENSITIVE Sensitive     * 2,000 COLONIES/mL ENTEROCOCCUS FAECALIS   Enterococcus faecium - MIC*    AMPICILLIN RESISTANT Resistant     NITROFURANTOIN 64 INTERMEDIATE Intermediate     VANCOMYCIN <=0.5 SENSITIVE Sensitive     * 20,000 COLONIES/mL ENTEROCOCCUS FAECIUM  Resp Panel by RT-PCR (Flu A&B, Covid) Nasopharyngeal Swab     Status: None   Collection Time: 02/04/21  2:52 AM   Specimen: Nasopharyngeal Swab; Nasopharyngeal(NP) swabs in vial transport medium  Result Value Ref Range Status   SARS Coronavirus 2 by RT PCR NEGATIVE NEGATIVE Final    Comment: (NOTE) SARS-CoV-2 target nucleic acids are NOT DETECTED.  The SARS-CoV-2 RNA is generally detectable in upper respiratory specimens during the acute phase of infection. The lowest concentration of SARS-CoV-2 viral copies this assay can detect is 138 copies/mL. A negative result does not preclude SARS-Cov-2 infection and should not be used as the sole basis for treatment or other patient management decisions. A negative result may occur with  improper specimen collection/handling, submission of specimen other than nasopharyngeal swab, presence of viral mutation(s) within the areas targeted by this assay, and inadequate number of  viral copies(<138 copies/mL). A negative result must be combined with clinical observations, patient history, and epidemiological information. The expected result is Negative.  Fact Sheet for Patients:  EntrepreneurPulse.com.au  Fact Sheet for Healthcare Providers:  IncredibleEmployment.be  This test is no t yet approved or cleared by the [place] FDA and  has been authorized for detection and/or diagnosis of SARS-CoV-2 by FDA under an Emergency Use Authorization (EUA). This EUA will remain  in effect (meaning this test can be used) for the duration of the COVID-19 declaration under Section 564(b)(1) of the Act, 21 U.S.C.section 360bbb-3(b)(1), unless the authorization is terminated  or revoked sooner.       Influenza A by PCR NEGATIVE NEGATIVE Final   Influenza B by PCR NEGATIVE NEGATIVE Final    Comment: (NOTE) The Xpert Xpress SARS-CoV-2/FLU/RSV plus assay is intended as an aid in the diagnosis of influenza from Nasopharyngeal swab specimens and should not  be used as a sole basis for treatment. Nasal washings and aspirates are unacceptable for Xpert Xpress SARS-CoV-2/FLU/RSV testing.  Fact Sheet for Patients: EntrepreneurPulse.com.au  Fact Sheet for Healthcare Providers: IncredibleEmployment.be  This test is not yet approved or cleared by the [place] FDA and has been authorized for detection and/or diagnosis of SARS-CoV-2 by FDA under an Emergency Use Authorization (EUA). This EUA will remain in effect (meaning this test can be used) for the duration of the COVID-19 declaration under Section 564(b)(1) of the Act, 21 U.S.C. section 360bbb-3(b)(1), unless the authorization is terminated or revoked.  Performed at [facility], [place] [address] [place], [place] [address]   Blood Culture (routine x 2)     Status: None   Collection Time: 02/04/21  2:57 AM   Specimen: BLOOD   Result Value Ref Range Status   Specimen Description   Final    BLOOD BLOOD RIGHT HAND Performed at [facility] [address], [place], [place] [address]    Special Requests   Final    BOTTLES DRAWN AEROBIC AND ANAEROBIC Blood Culture adequate volume Performed at [facility] [address], [place], [place] [address]    Culture   Final    NO GROWTH 5 DAYS Performed at [facility], [place] [address], [place], [place] [address]    Report Status 02/09/2021 FINAL  Final  C Difficile Quick Screen w PCR reflex     Status: Abnormal   Collection Time: 02/04/21  4:04 AM   Specimen: Stool  Result Value Ref Range Status   C Diff antigen POSITIVE (A) NEGATIVE Final   C Diff toxin POSITIVE (A) NEGATIVE Final   C Diff interpretation Toxin producing C. difficile detected.  Final    Comment: CRITICAL RESULT CALLED TO, READ BACK BY AND VERIFIED WITH: Valma Cava RN ON 02/04/2021 @ 8099 BY GOLSONM Performed at [facility] [address], [place], [place] [address]      Labs: Basic Metabolic Panel: Recent Labs  Lab 02/07/21 1010 02/08/21 0457 02/09/21 0724 02/10/21 0440 02/11/21 0434  NA 124* 125* 128* 130* 131*  K 4.5 4.4 4.3 4.0 3.8  CL 98 100 100 102 102  CO2 19* 18* 21* 23 24  GLUCOSE 79 81 83 116* 120*  BUN 32* 27* 23 21 17   CREATININE 1.23 1.21 1.06 1.04 0.76  CALCIUM 8.0* 7.9* 7.9* 8.0* 7.9*  MG  --   --  1.7  --   --    Liver Function Tests: Recent Labs  Lab 02/06/21 0432 02/07/21 1010 02/09/21 0724 02/10/21 0440  AST 21 22 22 26   ALT 10 10 10 11   ALKPHOS 73 159* 100 104  BILITOT 0.9 1.0 1.1 0.8  PROT 4.7* 5.0* 4.9* 5.0*  ALBUMIN 2.2* 2.2* 2.3* 2.2*   No results for input(s): LIPASE, AMYLASE in the last 168 hours. No results for input(s): AMMONIA in the last 168 hours. CBC: Recent Labs  Lab 02/06/21 0432 02/07/21 1010 02/08/21 0457 02/09/21 0724 02/10/21 0440  WBC 27.3* 23.2* 17.4* 12.9* 9.4   HGB 10.5* 10.7* 10.3* 10.4* 9.7*  HCT 30.5* 31.5* 30.5* 31.6* 29.2*  MCV 91.0 91.8 91.6 92.4 92.1  PLT 266 290 250 247 256   Cardiac Enzymes: No results for input(s): CKTOTAL, CKMB, CKMBINDEX, TROPONINI in the last 168 hours. BNP: BNP (last 3 results) Recent Labs    02/04/21 0750  BNP 660.5*    ProBNP (last 3 results)  No results for input(s): PROBNP in the last 8760 hours.  CBG: No results for input(s): GLUCAP in the last 168 hours.     Signed:  Kayleen Memos, MD [facility] 02/12/2021, 6:25 PM

## 2021-02-12 NOTE — Progress Notes (Signed)
[place] for Warfarin Indication: Afib and MVR  Allergies  Allergen Reactions   Diltiazem Other (See Comments)    "Bradycardia," per pt; tolerates Amlodipine   Sulfa Antibiotics Rash and Other (See Comments)    Reaction not fully recalled    Sulfonamide Derivatives Rash and Other (See Comments)    Reaction not fully recalled    Patient Measurements: Height: 5\' 9"  (175.3 cm) Weight: 79.2 kg (174 lb 9.7 oz) IBW/kg (Calculated) : 70.7 Heparin Dosing Weight: 74.5 kg  Vital Signs: Temp: 97.6 F (36.4 C) (11/20 0336) Temp Source: Oral (11/20 0336) BP: 117/56 (11/20 0336) Pulse Rate: 73 (11/20 0336)  Labs: Recent Labs    02/10/21 0440 02/11/21 0434 02/12/21 0444  HGB 9.7*  --   --   HCT 29.2*  --   --   PLT 256  --   --   LABPROT 41.6* 31.4* 30.4*  INR 4.4* 3.0* 2.9*  CREATININE 1.04 0.76  --    Estimated Creatinine Clearance: 67.5 mL/min (by C-G formula based on SCr of 0.76 mg/dL).  Medications:  Scheduled:   allopurinol  300 mg Oral Daily   atorvastatin  20 mg Oral Once per day on Mon Fri   bisoprolol  2.5 mg Oral QHS   fidaxomicin  200 mg Oral BID   ipratropium-albuterol  3 mL Nebulization BID   latanoprost  1 drop Both Eyes QHS   saccharomyces boulardii  250 mg Oral BID   sodium chloride  1 g Oral TID WC   Warfarin - Pharmacist Dosing Inpatient   Does not apply q1600   Assessment: Admit for severe Cdiff colitis, started on Vancomycin po as outpatient. On Warfarin PTA for Afib & MVR - 5 mg daily, except 2.5 mg MF, with last dose 11/11.  Today, 02/12/2021: INR slightly subtherapeutic today at 2.9 - INR downtrend seems to have stabilized  CBC: Hgb low stable, platelets wnl and stable No bleeding issues reported Flagyl d/c 11/14 due to interaction with warfarin  Started Dificid 11/14 for Cdiff tx  Goal of Therapy:  INR goal 3-3.5 per outpatient clinic note Monitor platelets by anticoagulation protocol:  Yes  Plan: Warfarin 5mg  PO x1 today Daily INR CBC at least q72 hr while on warfarin Monitor for signs of bleeding or thrombosis  Dimple Nanas, PharmD 02/12/2021 9:26 AM

## 2021-02-13 ENCOUNTER — Telehealth: Payer: Self-pay | Admitting: Internal Medicine

## 2021-02-13 DIAGNOSIS — I4819 Other persistent atrial fibrillation: Secondary | ICD-10-CM | POA: Diagnosis not present

## 2021-02-13 DIAGNOSIS — I872 Venous insufficiency (chronic) (peripheral): Secondary | ICD-10-CM | POA: Diagnosis not present

## 2021-02-13 DIAGNOSIS — I272 Pulmonary hypertension, unspecified: Secondary | ICD-10-CM | POA: Diagnosis not present

## 2021-02-13 DIAGNOSIS — E785 Hyperlipidemia, unspecified: Secondary | ICD-10-CM | POA: Diagnosis not present

## 2021-02-13 DIAGNOSIS — Z7982 Long term (current) use of aspirin: Secondary | ICD-10-CM | POA: Diagnosis not present

## 2021-02-13 DIAGNOSIS — A0472 Enterocolitis due to Clostridium difficile, not specified as recurrent: Secondary | ICD-10-CM | POA: Diagnosis not present

## 2021-02-13 DIAGNOSIS — N183 Chronic kidney disease, stage 3 unspecified: Secondary | ICD-10-CM | POA: Diagnosis not present

## 2021-02-13 DIAGNOSIS — I130 Hypertensive heart and chronic kidney disease with heart failure and stage 1 through stage 4 chronic kidney disease, or unspecified chronic kidney disease: Secondary | ICD-10-CM | POA: Diagnosis not present

## 2021-02-13 DIAGNOSIS — E872 Acidosis, unspecified: Secondary | ICD-10-CM | POA: Diagnosis not present

## 2021-02-13 DIAGNOSIS — K7460 Unspecified cirrhosis of liver: Secondary | ICD-10-CM | POA: Diagnosis not present

## 2021-02-13 DIAGNOSIS — M109 Gout, unspecified: Secondary | ICD-10-CM | POA: Diagnosis not present

## 2021-02-13 DIAGNOSIS — E1122 Type 2 diabetes mellitus with diabetic chronic kidney disease: Secondary | ICD-10-CM | POA: Diagnosis not present

## 2021-02-13 DIAGNOSIS — K219 Gastro-esophageal reflux disease without esophagitis: Secondary | ICD-10-CM | POA: Diagnosis not present

## 2021-02-13 DIAGNOSIS — I5032 Chronic diastolic (congestive) heart failure: Secondary | ICD-10-CM | POA: Diagnosis not present

## 2021-02-13 DIAGNOSIS — E1151 Type 2 diabetes mellitus with diabetic peripheral angiopathy without gangrene: Secondary | ICD-10-CM | POA: Diagnosis not present

## 2021-02-13 NOTE — Telephone Encounter (Signed)
Caller patient's spouse [name] requesting a call back to discuss patient's medication that was discontinued by hospital MD  Offered caller a f/u appt, caller stated patient is unable to walk into office

## 2021-02-14 NOTE — Telephone Encounter (Signed)
Called pt wife she states she just needed to go over the medication that the hospital told him to stop taking. Went over the medications in which she called out 4. Advise wife per chart pt also supposed to stop the Pantoprazole since he is C-diff positive. Wife states he wastold to f/u w/ PCP. He has appt on 03/15/21 but think that maybe to long.. Inform pt MD is out of the office until Dec 1st. Since he is already booked. I will call her back whn MD return to see if appt can be moved up. Pls advise...lmb

## 2021-02-16 NOTE — Telephone Encounter (Signed)
We could work him in or have him see someone else if need to see him sooner. Thx

## 2021-02-21 ENCOUNTER — Telehealth: Payer: Self-pay | Admitting: Internal Medicine

## 2021-02-21 NOTE — Telephone Encounter (Signed)
Hinton Dyer w/ [facility] informing provider she attempted to take patient's blood sugar, but was unable to draw blood  Caller states another nurse will attempt a finger stick tomorrow

## 2021-02-21 NOTE — Telephone Encounter (Signed)
Patient calling back in  Says she is still wanting cb from nurse to discuss patients current medications since he has been out of hosp & also has concerns regarding his blood sugar levels  Please call [phone]

## 2021-02-22 ENCOUNTER — Ambulatory Visit (INDEPENDENT_AMBULATORY_CARE_PROVIDER_SITE_OTHER): Payer: Medicare Other

## 2021-02-22 DIAGNOSIS — Z9889 Other specified postprocedural states: Secondary | ICD-10-CM

## 2021-02-22 DIAGNOSIS — I480 Paroxysmal atrial fibrillation: Secondary | ICD-10-CM | POA: Diagnosis not present

## 2021-02-22 DIAGNOSIS — I059 Rheumatic mitral valve disease, unspecified: Secondary | ICD-10-CM | POA: Diagnosis not present

## 2021-02-22 DIAGNOSIS — Z7901 Long term (current) use of anticoagulants: Secondary | ICD-10-CM | POA: Diagnosis not present

## 2021-02-22 DIAGNOSIS — I6350 Cerebral infarction due to unspecified occlusion or stenosis of unspecified cerebral artery: Secondary | ICD-10-CM | POA: Diagnosis not present

## 2021-02-22 DIAGNOSIS — G459 Transient cerebral ischemic attack, unspecified: Secondary | ICD-10-CM

## 2021-02-22 LAB — POCT INR: INR: 3.5 — AB (ref 2.0–3.0)

## 2021-02-22 NOTE — Patient Instructions (Signed)
Description   Spoke with Hinton Dyer, nurse with [facility] while in pt's home, advised to have pt continue on same dosage of Warfarin 1 tablet daily except 1/2 tablet on Mondays and Fridays.  Pt currently on IV Flagyl.  Recheck INR in 1 week.  Coumadin Clinic [phone]

## 2021-02-23 ENCOUNTER — Telehealth: Payer: Self-pay | Admitting: Cardiovascular Disease

## 2021-02-23 ENCOUNTER — Other Ambulatory Visit: Payer: Self-pay | Admitting: Internal Medicine

## 2021-02-23 NOTE — Telephone Encounter (Signed)
Pt c/o swelling: STAT is pt has developed SOB within 24 hours  If swelling, where is the swelling located? Both legs, below knee down to his feet.   How much weight have you gained and in what time span? Not weighing himself  Have you gained 3 pounds in a day or 5 pounds in a week? ---  Do you have a log of your daily weights (if so, list)? ---  Are you currently taking a fluid pill? yes  Are you currently SOB? no  Have you traveled recently? No  She just wanted to make [place] aware of his leg swelling.

## 2021-02-23 NOTE — Telephone Encounter (Addendum)
Noted. C diff dx was last month, rx with abx. Plan to follow office protocol. OK for visit.

## 2021-02-23 NOTE — Telephone Encounter (Signed)
Spoke w patient's wife. The patient was hospitalized, was off blood thinner, received transfusion and multiple antibiotics and then developed CDIFF.  Diuretic has been on hold due to this.  He is having swelling in BLE.  She is concerned w him being off the lasix so long.    He has not had loose stools in about a week.  He is eating and drinking without difficulty now.   His dc instructions indicate cardiology f/u in one week and he is overdue for his yearly visit with Dr. Angelena Form.  I moved his appointment to tomorrow.  Pt's wife is grateful for assistance.

## 2021-02-23 NOTE — Telephone Encounter (Signed)
I spoke to patient and told him he could come for office visit as planned. No restrictions.

## 2021-02-23 NOTE — Telephone Encounter (Signed)
Pt states that he has been diagnosed with cdiff and would like to know if there are any restrictions on him coming in the office to be seen... please advise

## 2021-02-24 ENCOUNTER — Ambulatory Visit: Payer: Medicare Other | Admitting: Cardiovascular Disease

## 2021-02-24 ENCOUNTER — Other Ambulatory Visit: Payer: Self-pay

## 2021-02-24 ENCOUNTER — Encounter: Payer: Self-pay | Admitting: Cardiovascular Disease

## 2021-02-24 VITALS — BP 128/60 | HR 95 | Ht 69.0 in | Wt 173.6 lb

## 2021-02-24 DIAGNOSIS — I059 Rheumatic mitral valve disease, unspecified: Secondary | ICD-10-CM

## 2021-02-24 DIAGNOSIS — I10 Essential (primary) hypertension: Secondary | ICD-10-CM | POA: Diagnosis not present

## 2021-02-24 DIAGNOSIS — I5033 Acute on chronic diastolic (congestive) heart failure: Secondary | ICD-10-CM | POA: Diagnosis not present

## 2021-02-24 DIAGNOSIS — I4821 Permanent atrial fibrillation: Secondary | ICD-10-CM | POA: Diagnosis not present

## 2021-02-24 NOTE — Patient Instructions (Signed)
Medication Instructions:  Your physician has recommended you make the following change in your medication:  1.) LASIX ((furosemide) 40 mg - take 80 mg (TWO tablets) daily for FOUR DAYS, then decrease to 40 mg daily  *If you need a refill on your cardiac medications before your next appointment, please call your pharmacy*   Lab Work: none   Testing/Procedures: ECHO IN 4-6 WEEKS  - PRIOR TO NEXT APPOINTMENT Your physician has requested that you have an echocardiogram. Echocardiography is a painless test that uses sound waves to create images of your heart. It provides your doctor with information about the size and shape of your heart and how well your heart's chambers and valves are working. This procedure takes approximately one hour. There are no restrictions for this procedure.    Follow-Up: At [facility], you and your health needs are our priority.  As part of our continuing mission to provide you with exceptional heart care, we have created designated Provider Care Teams.  These Care Teams include your primary Cardiologist (physician) and Advanced Practice Providers (APPs -  Physician Assistants and Nurse Practitioners) who all work together to provide you with the care you need, when you need it.   Your next appointment:   6-8 week(s)  ECHO PRIOR  WITH DR. Angelena Form OR [name] DUNN

## 2021-02-24 NOTE — Progress Notes (Signed)
Chief Complaint  Patient presents with   Follow-up    Atrial fibrillation      History of Present Illness: 87 yo male with history of severe mitral regurgitation s/p MVR in 1986, permanent atrial fibrillation, pulmonary HTN, HTN, HLD, DM, CKD stage III and prior TIA on warfarin who is here today for cardiac follow up. He has been followed by Dr. Saunders Revel. I saw him for the first time in August 2020. He had MVR in 1986. He has been on coumadin. He has had prior TIA and has been on ASA and dypridamole. Syncope in 2020. Echo in December 2020 with LVEF=55-60%, moderate LVH, MVR with moderate MR. Cardiac monitor in 2020 with atrial fib, occasional PVCs, NSVT. He stopped his Cardizem. He was admitted in September 2022 with an appendectomy. He developed an abscess and was readmitted in October 2022 for treatment. He then developed C diff colitis and was admitted for several weeks. His norvasc and Lisinopril were stopped due to hypotension.   He is here today for follow up. The patient denies any chest pain, dyspnea, palpitations, orthopnea, PND, dizziness, near syncope or syncope. He has had recent worsening of his LE edema. He has been taking Lasix 40 mg daily. He is slowly getting his energy back. His diarrhea has resolved.   Primary Care Physician: Cassandria Anger, MD  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   NSVT (nonsustained ventricular tachycardia)    Peripheral edema    venous insufficiency   Permanent atrial fibrillation ([place])    Pulmonary hypertension ([place])    Syncope    TIA (transient ischemic attack)    while on coumadin   Type II or unspecified type diabetes mellitus without mention of complication, not stated as uncontrolled    lifestyle mangement   Ventricular  ectopy    symptomatic    Past Surgical History:  Procedure Laterality Date   CHOLECYSTECTOMY, LAPAROSCOPIC  2003   IR PATIENT EVAL TECH 0-60 MINS  01/09/2021   IR RADIOLOGIST EVAL & MGMT  01/19/2021   IR RADIOLOGIST EVAL & MGMT  02/02/2021   LAPAROSCOPIC APPENDECTOMY N/A 12/05/2020   Procedure: APPENDECTOMY LAPAROSCOPIC;  Surgeon: Stark Klein, MD;  Location: [facility];  Service: General;  Laterality: N/A;   MITRAL VALVE REPLACEMENT     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    Current Outpatient Medications  Medication Sig Dispense Refill   acetaminophen (TYLENOL) 500 MG tablet Take 500 mg by mouth every 6 (six) hours as needed for mild pain or headache.     allopurinol (ZYLOPRIM) 300 MG tablet TAKE 1 TABLET BY MOUTH DAILY AS NEEDED. GENERIC EQUIVALENT FOR ZYLOPRIM 90 tablet 3   aspirin 81 MG EC tablet Take 81 mg by mouth daily.     atorvastatin (LIPITOR) 20 MG tablet TAKE 1 TABLET BY MOUTH TWO TIMES A WEEK 26 tablet 3   bisoprolol (ZEBETA) 5 MG tablet Take 1 tablet (5 mg total) by mouth daily. Please keep upcoming appt for future refills. Thank you 45 tablet 0   Cholecalciferol (VITAMIN D3) 50 MCG (2000 UT) capsule TAKE 1 CAPSULE BY MOUTH EVERY DAY 100 capsule 3   dipyridamole (PERSANTINE) 50 MG tablet Take 1 tablet (50 mg  total) by mouth 3 (three) times daily. 270 tablet 1   feeding supplement (ENSURE ENLIVE / ENSURE PLUS) LIQD Take 237 mLs by mouth 2 (two) times daily between meals. 5000 mL 0   ferrous [id number] C-folic acid (TRINSICON / FOLTRIN) capsule Take 1 capsule by mouth 2 (two) times daily after a meal. 60 capsule 1   furosemide (LASIX) 40 MG tablet TAKE 1 TABLET BY MOUTH DAILY GENERIC EQUIVALENT FOR LASIX 90 tablet 3   latanoprost (XALATAN) 0.005 % ophthalmic solution Place 1 drop into both eyes at bedtime.     saccharomyces boulardii (FLORASTOR) 250 MG capsule Take 1 capsule (250 mg total) by mouth 2 (two) times daily. 180 capsule 0   sodium chloride 1 g  tablet Take 1 tablet (1 g total) by mouth 2 (two) times daily with a meal for 14 days. 28 tablet 0   Sodium Chloride Flush (NORMAL SALINE FLUSH) 0.9 % SOLN Use 5 MLs to flush the drain daily 150 mL 2   warfarin (COUMADIN) 5 MG tablet TAKE 1 TABLET BY MOUTH AS DIRECTED BY THE COUMADIN CLINIC (Patient taking differently: Take 2.5-5 mg by mouth See admin instructions. Take 2.5mg  by mouth daily on Monday and Friday, then take 5mg  all other days or as directed by the Coumadin clinic) 100 tablet 1   No current facility-administered medications for this visit.    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  Tobacco Use   Smoking status: Never   Smokeless tobacco: Never  Substance and Sexual Activity   Alcohol use: No    Alcohol/week: 0.0 standard drinks   Drug use: No   Sexual activity: Not on file  Other Topics Concern   Not on file  Social History Narrative   HSG, many management courses Married '58, 2 sons - '61, '62   Social Determinants of Health   Emergency planning/management officer Strain: Not on Art therapist Insecurity: Not on file  Transportation Needs: Not on file  Physical Activity: Not on file  Stress: Not on file  Social Connections: Not on file  Intimate Partner Violence: Not on file    Family History  Problem Relation Age of Onset   Coronary artery disease Father    Diabetes Other    Coronary artery disease Other    Prostate cancer Neg Hx    Colon cancer Neg Hx     Review  of Systems:  As stated in the HPI and otherwise negative.   BP 128/60   Pulse 95   Ht 5\' 9"  (1.753 m)   Wt 173 lb 9.6 oz (78.7 kg)   SpO2 95%   BMI 25.64 kg/m   Physical Examination:  General: Well developed, well nourished, NAD  HEENT: OP clear, mucus membranes moist  SKIN: warm, dry. No rashes. Neuro: No focal deficits  Musculoskeletal: Muscle strength 5/5 all ext  Psychiatric: Mood and affect normal  Neck: No  JVD, no carotid bruits, no thyromegaly, no lymphadenopathy.  Lungs:Clear bilaterally, no wheezes, rhonci, crackles Cardiovascular: Irreg irreg. Systolic murmur with crisp valve click.  Abdomen:Soft. Bowel sounds present. Non-tender.  Extremities: 2+ bilateral lower extremity pitting edema.   EKG:  EKG is not ordered today. The ekg ordered today demonstrates   Recent Labs: 02/04/2021: B Natriuretic Peptide 660.5; TSH 6.199 02/09/2021: Magnesium 1.7 02/10/2021: ALT 11; Hemoglobin 9.7; Platelets 256 02/11/2021: BUN 17; Creatinine, Ser 0.76; Potassium 3.8; Sodium 131   Lipid Panel    Component Value Date/Time   CHOL 140 04/01/2020 0808   TRIG 96.0 04/01/2020 0808   HDL 58.80 04/01/2020 0808   CHOLHDL 2 04/01/2020 0808   VLDL 19.2 04/01/2020 0808   LDLCALC 62 04/01/2020 0808     Wt Readings from Last 3 Encounters:  02/24/21 173 lb 9.6 oz (78.7 kg)  02/05/21 174 lb 9.7 oz (79.2 kg)  01/30/21 164 lb 3.2 oz (74.5 kg)     Other studies Reviewed: Additional studies/ records that were reviewed today include:  Review of the above records demonstrates:    Assessment and Plan:   1. Persistent atrial fibrillation: Atrial fib this am, rate controlled. Will continue bisoprolol. He stopped Cardizem. Continue coumadin.    2. Severe mitral regurgitation: He is s/p mechanical mitral valve replacement. Will continue ASA and coumadin. He will use SBE prophylaxis as needed prior to dental procedures. Echo now to assess valve.   3. HTN: BP is controlled. No changes  4.  HLD: Lipids followed in primary care. LDL 62. Continue statin  5. Acute on chronic diastolic CHF: He has worsened LE edema. Will increase Lasix to 40 mg po BID for 4 days then 40 mg daily. Echo as above.   Current medicines are reviewed at length with the patient today.  The patient does not have concerns regarding medicines.  The following changes have been made:  no change  Labs/ tests ordered today include:   Orders Placed This Encounter  Procedures   ECHOCARDIOGRAM COMPLETE     Disposition:   F/U with me in 12 months.    Signed, Lauree Chandler, MD 02/24/2021 5:01 PM    [facility] [place], [place], [place]  [address] Phone: [phone]; Fax: [phone]

## 2021-02-26 ENCOUNTER — Inpatient Hospital Stay (HOSPITAL_COMMUNITY)
Admission: EM | Admit: 2021-02-26 | Discharge: 2021-03-03 | DRG: 371 | Disposition: A | Discharge status: Home Health | Payer: Medicare Other | Attending: Internal Medicine | Admitting: Internal Medicine

## 2021-02-26 ENCOUNTER — Encounter (HOSPITAL_COMMUNITY): Payer: Self-pay | Admitting: Emergency Medicine

## 2021-02-26 ENCOUNTER — Other Ambulatory Visit: Payer: Self-pay

## 2021-02-26 DIAGNOSIS — Z833 Family history of diabetes mellitus: Secondary | ICD-10-CM

## 2021-02-26 DIAGNOSIS — E1159 Type 2 diabetes mellitus with other circulatory complications: Secondary | ICD-10-CM | POA: Diagnosis not present

## 2021-02-26 DIAGNOSIS — I4819 Other persistent atrial fibrillation: Secondary | ICD-10-CM | POA: Diagnosis not present

## 2021-02-26 DIAGNOSIS — R197 Diarrhea, unspecified: Secondary | ICD-10-CM

## 2021-02-26 DIAGNOSIS — R9431 Abnormal electrocardiogram [ECG] [EKG]: Secondary | ICD-10-CM | POA: Diagnosis not present

## 2021-02-26 DIAGNOSIS — Z8249 Family history of ischemic heart disease and other diseases of the circulatory system: Secondary | ICD-10-CM

## 2021-02-26 DIAGNOSIS — E876 Hypokalemia: Secondary | ICD-10-CM | POA: Diagnosis present

## 2021-02-26 DIAGNOSIS — Z888 Allergy status to other drugs, medicaments and biological substances status: Secondary | ICD-10-CM

## 2021-02-26 DIAGNOSIS — I472 Ventricular tachycardia, unspecified: Secondary | ICD-10-CM | POA: Diagnosis not present

## 2021-02-26 DIAGNOSIS — E871 Hypo-osmolality and hyponatremia: Secondary | ICD-10-CM | POA: Diagnosis present

## 2021-02-26 DIAGNOSIS — Z79899 Other long term (current) drug therapy: Secondary | ICD-10-CM

## 2021-02-26 DIAGNOSIS — K7460 Unspecified cirrhosis of liver: Secondary | ICD-10-CM | POA: Diagnosis not present

## 2021-02-26 DIAGNOSIS — Z7901 Long term (current) use of anticoagulants: Secondary | ICD-10-CM

## 2021-02-26 DIAGNOSIS — E1122 Type 2 diabetes mellitus with diabetic chronic kidney disease: Secondary | ICD-10-CM | POA: Diagnosis not present

## 2021-02-26 DIAGNOSIS — I130 Hypertensive heart and chronic kidney disease with heart failure and stage 1 through stage 4 chronic kidney disease, or unspecified chronic kidney disease: Secondary | ICD-10-CM | POA: Diagnosis present

## 2021-02-26 DIAGNOSIS — Z20822 Contact with and (suspected) exposure to covid-19: Secondary | ICD-10-CM | POA: Diagnosis present

## 2021-02-26 DIAGNOSIS — Z952 Presence of prosthetic heart valve: Secondary | ICD-10-CM | POA: Diagnosis not present

## 2021-02-26 DIAGNOSIS — I272 Pulmonary hypertension, unspecified: Secondary | ICD-10-CM | POA: Diagnosis present

## 2021-02-26 DIAGNOSIS — N179 Acute kidney failure, unspecified: Secondary | ICD-10-CM

## 2021-02-26 DIAGNOSIS — I340 Nonrheumatic mitral (valve) insufficiency: Secondary | ICD-10-CM | POA: Diagnosis not present

## 2021-02-26 DIAGNOSIS — I5082 Biventricular heart failure: Secondary | ICD-10-CM | POA: Diagnosis present

## 2021-02-26 DIAGNOSIS — Z8673 Personal history of transient ischemic attack (TIA), and cerebral infarction without residual deficits: Secondary | ICD-10-CM | POA: Diagnosis not present

## 2021-02-26 DIAGNOSIS — Z882 Allergy status to sulfonamides status: Secondary | ICD-10-CM | POA: Diagnosis not present

## 2021-02-26 DIAGNOSIS — A0471 Enterocolitis due to Clostridium difficile, recurrent: Secondary | ICD-10-CM | POA: Diagnosis present

## 2021-02-26 DIAGNOSIS — I10 Essential (primary) hypertension: Secondary | ICD-10-CM | POA: Diagnosis not present

## 2021-02-26 DIAGNOSIS — E785 Hyperlipidemia, unspecified: Secondary | ICD-10-CM | POA: Diagnosis present

## 2021-02-26 DIAGNOSIS — I5033 Acute on chronic diastolic (congestive) heart failure: Secondary | ICD-10-CM | POA: Diagnosis present

## 2021-02-26 DIAGNOSIS — I4821 Permanent atrial fibrillation: Secondary | ICD-10-CM | POA: Diagnosis present

## 2021-02-26 DIAGNOSIS — E119 Type 2 diabetes mellitus without complications: Secondary | ICD-10-CM

## 2021-02-26 DIAGNOSIS — Z6825 Body mass index (BMI) 25.0-25.9, adult: Secondary | ICD-10-CM

## 2021-02-26 DIAGNOSIS — E860 Dehydration: Secondary | ICD-10-CM

## 2021-02-26 DIAGNOSIS — R630 Anorexia: Secondary | ICD-10-CM | POA: Diagnosis present

## 2021-02-26 DIAGNOSIS — Z7982 Long term (current) use of aspirin: Secondary | ICD-10-CM | POA: Diagnosis not present

## 2021-02-26 DIAGNOSIS — N1831 Chronic kidney disease, stage 3a: Secondary | ICD-10-CM | POA: Diagnosis present

## 2021-02-26 LAB — CBC
HCT: 32.3 % — ABNORMAL LOW (ref 39.0–52.0)
Hemoglobin: 10.4 g/dL — ABNORMAL LOW (ref 13.0–17.0)
MCH: 31.2 pg (ref 26.0–34.0)
MCHC: 32.2 g/dL (ref 30.0–36.0)
MCV: 97 fL (ref 80.0–100.0)
Platelets: 164 10*3/uL (ref 150–400)
RBC: 3.33 MIL/uL — ABNORMAL LOW (ref 4.22–5.81)
RDW: 17.4 % — ABNORMAL HIGH (ref 11.5–15.5)
WBC: 6.5 10*3/uL (ref 4.0–10.5)
nRBC: 0 % (ref 0.0–0.2)

## 2021-02-26 LAB — COMPREHENSIVE METABOLIC PANEL
ALT: 15 U/L (ref 0–44)
AST: 30 U/L (ref 15–41)
Albumin: 3.4 g/dL — ABNORMAL LOW (ref 3.5–5.0)
Alkaline Phosphatase: 72 U/L (ref 38–126)
Anion gap: 11 (ref 5–15)
BUN: 14 mg/dL (ref 8–23)
CO2: 25 mmol/L (ref 22–32)
Calcium: 8.1 mg/dL — ABNORMAL LOW (ref 8.9–10.3)
Chloride: 97 mmol/L — ABNORMAL LOW (ref 98–111)
Creatinine, Ser: 0.93 mg/dL (ref 0.61–1.24)
GFR, Estimated: >60 mL/min (ref >60)
Glucose, Bld: 119 mg/dL — ABNORMAL HIGH (ref 70–99)
Potassium: 3.3 mmol/L — ABNORMAL LOW (ref 3.5–5.1)
Sodium: 133 mmol/L — ABNORMAL LOW (ref 135–145)
Total Bilirubin: 1.3 mg/dL — ABNORMAL HIGH (ref 0.3–1.2)
Total Protein: 6.7 g/dL (ref 6.5–8.1)

## 2021-02-26 LAB — RESP PANEL BY RT-PCR (FLU A&B, COVID) ARPGX2
Influenza A by PCR: NEGATIVE
Influenza B by PCR: NEGATIVE
SARS Coronavirus 2 by RT PCR: NEGATIVE

## 2021-02-26 LAB — LIPASE, BLOOD: Lipase: 27 U/L (ref 11–51)

## 2021-02-26 MED ORDER — LACTATED RINGERS IV BOLUS
500.0000 mL | Freq: Once | INTRAVENOUS | Status: AC
  Administered 2021-02-26: 23:00:00 500 mL via INTRAVENOUS

## 2021-02-26 MED ORDER — ONDANSETRON HCL 4 MG/2ML IJ SOLN
4.0000 mg | Freq: Once | INTRAMUSCULAR | Status: AC
  Administered 2021-02-26: 23:00:00 4 mg via INTRAVENOUS
  Filled 2021-02-26: qty 2

## 2021-02-26 MED ORDER — POTASSIUM CHLORIDE CRYS ER 20 MEQ PO TBCR
40.0000 meq | EXTENDED_RELEASE_TABLET | Freq: Once | ORAL | Status: AC
  Administered 2021-02-26: 23:00:00 40 meq via ORAL
  Filled 2021-02-26: qty 2

## 2021-02-26 MED ORDER — MAGNESIUM SULFATE 2 GM/50ML IV SOLN
2.0000 g | Freq: Once | INTRAVENOUS | Status: AC
  Administered 2021-02-26: 23:00:00 2 g via INTRAVENOUS
  Filled 2021-02-26: qty 50

## 2021-02-26 NOTE — ED Provider Notes (Signed)
[facility] DEPT Provider Note   CSN: [id number] Arrival date & time: 02/26/21  2022     History Chief Complaint  Patient presents with   Diarrhea    Anthony Gould is a 87 y.o. male.  HPI     87 year old male with recent history of C. difficile colitis, chronic kidney disease, HTN, HLD, chronic diastolic CHF, mitral regurgitation status post mechanical MVR on chronic Coumadin, permanent A. fib, pulmonary hypertension, DM2, cirrhosis, melanoma comes to the hospital with complaints of diarrhea.  Patient had appendicitis followed by hematoma and abscess, bacteremia.  Eventually he ended up with C. difficile for which he was admitted to the hospital between 11-12 to 11-20.  Patient was slowly improving, but started having diarrhea again today.  Patient reports that he had completed the dose of Dificid as indicated in his discharge paperwork.  Today patient has had 10 loose bowel movements.  He has nausea, no appetite and has not been able to tolerate fluid and food.  He is feeling significantly weak, extremely thirsty and dizzy.  Family reports low-grade fever yesterday.  Patient does not have any cough or URI-like symptoms.  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   NSVT (nonsustained ventricular tachycardia)    Peripheral edema    venous insufficiency   Permanent atrial fibrillation (HCC)    Pulmonary hypertension (HCC)    Syncope    TIA (transient ischemic attack)    while on coumadin   Type II or unspecified type diabetes mellitus without mention of complication, not stated as uncontrolled    lifestyle mangement   Ventricular ectopy    symptomatic    Patient Active Problem List   Diagnosis Date Noted   Sepsis ([place])  02/04/2021   Appendiceal abscess 01/10/2021   Hyponatremia 12/30/2020   Hypokalemia 12/30/2020   Iron deficiency anemia 12/30/2020   Postoperative infected hematoma due to E. coli 12/29/2020   Supratherapeutic INR 12/29/2020   Anemia 12/28/2020   H/O mitral valve replacement with mechanical valve    Acute appendicitis 12/03/2020   Insomnia 09/27/2020   Grief at loss of child 09/01/2020   Right hip pain 08/10/2020   Elevated TSH 07/07/2020   CRI (chronic renal insufficiency), stage 3 (moderate) ([place]) 04/08/2020   Neoplasm of uncertain behavior of skin 10/01/2019   Hypomagnesemia 04/02/2019   Syncope 03/05/2019   Abdominal pain 01/20/2019   Vitamin D deficiency 11/28/2018   Low back pain 11/26/2018   Skin cancer 11/26/2018   Melanoma in situ ([place]) 08/26/2018   Foot lesion 04/23/2018   Hematuria 12/18/2017   Night sweats 12/18/2017   Chronic diastolic heart failure ([place]) 09/16/2016   Food allergy 02/13/2016   Cirrhosis of liver without ascites ([place]) 10/12/2015   Nausea with vomiting 10/12/2015   Emesis 09/20/2015   Aspiration pneumonia ([place]) 09/03/2015   Food poisoning 09/02/2015   Hematemesis 09/02/2015   Cough 09/02/2015   Flank pain, acute 05/02/2015   Actinic keratoses 10/20/2014   NSVT (nonsustained ventricular tachycardia) ([place]) 02/16/2013   Routine health maintenance 02/06/2012   Paroxysmal atrial fibrillation ([place]) 09/06/2011   Long term (current) use of anticoagulants 07/04/2010   Gout 06/27/2010   Mitral valve disorder 06/06/2010   Chest pain 02/28/2010  TOXIC LABYRINTHITIS 08/04/2009   Hearing loss 07/07/2009   DYSPEPSIA, CHRONIC 06/23/2007   DM2 (diabetes mellitus, type 2) ([place]) 02/08/2007   Hyperlipidemia 01/31/2007   Essential hypertension 01/31/2007   Coronary atherosclerosis 01/31/2007   PAF (paroxysmal atrial fibrillation) ([place]) 01/31/2007   Cerebral artery occlusion with cerebral infarction ([place]) 01/31/2007   Transient cerebral ischemia 01/31/2007    MITRAL VALVE REPLACEMENT, HX OF 01/31/2007    Past Surgical History:  Procedure Laterality Date   CHOLECYSTECTOMY, LAPAROSCOPIC  2003   IR PATIENT EVAL TECH 0-60 MINS  01/09/2021   IR RADIOLOGIST EVAL & MGMT  01/19/2021   IR RADIOLOGIST EVAL & MGMT  02/02/2021   LAPAROSCOPIC APPENDECTOMY N/A 12/05/2020   Procedure: APPENDECTOMY LAPAROSCOPIC;  Surgeon: Stark Klein, MD;  Location: [facility];  Service: General;  Laterality: N/A;   MITRAL VALVE REPLACEMENT     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       Family History  Problem Relation Age of Onset   Coronary artery disease Father    Diabetes Other    Coronary artery disease Other    Prostate cancer Neg Hx    Colon cancer Neg Hx     Social History   Tobacco Use   Smoking status: Never   Smokeless tobacco: Never  Substance Use Topics   Alcohol use: No    Alcohol/week: 0.0 standard drinks   Drug use: No    Home Medications Prior to Admission medications   Medication Sig Start Date End Date Taking? Authorizing Provider  acetaminophen (TYLENOL) 500 MG tablet Take 500 mg by mouth every 6 (six) hours as needed for mild pain or headache.    [provider]  allopurinol (ZYLOPRIM) 300 MG tablet TAKE 1 TABLET BY MOUTH DAILY AS NEEDED. GENERIC EQUIVALENT FOR ZYLOPRIM 04/14/20   Plotnikov, Evie Lacks, MD  aspirin 81 MG EC tablet Take 81 mg by mouth daily.    [provider]  atorvastatin (LIPITOR) 20 MG tablet TAKE 1 TABLET BY MOUTH TWO TIMES A WEEK 04/14/20   Plotnikov, Evie Lacks, MD  bisoprolol (ZEBETA) 5 MG tablet Take 1 tablet (5 mg total) by mouth daily. Please keep upcoming appt for future refills. Thank you 01/13/21   Burnell Blanks, MD  Cholecalciferol (VITAMIN D3) 50 MCG (2000 UT) capsule TAKE 1 CAPSULE BY MOUTH EVERY DAY 02/23/21   Plotnikov, Evie Lacks, MD  dipyridamole (PERSANTINE) 50 MG tablet Take 1 tablet (50 mg total) by mouth 3 (three) times daily. 08/08/20   Burnell Blanks, MD   feeding supplement (ENSURE ENLIVE / ENSURE PLUS) LIQD Take 237 mLs by mouth 2 (two) times daily between meals. 01/03/21   Lavina Hamman, MD  ferrous [id number] C-folic acid (TRINSICON / FOLTRIN) capsule Take 1 capsule by mouth 2 (two) times daily after a meal. 12/18/20   Lorella Nimrod, MD  furosemide (LASIX) 40 MG tablet TAKE 1 TABLET BY MOUTH DAILY GENERIC EQUIVALENT FOR LASIX 07/04/20   Plotnikov, Evie Lacks, MD  latanoprost (XALATAN) 0.005 % ophthalmic solution Place 1 drop into both eyes at bedtime.    [provider]  saccharomyces boulardii (FLORASTOR) 250 MG capsule Take 1 capsule (250 mg total) by mouth 2 (two) times daily. 02/12/21 05/13/21  Kayleen Memos, DO  sodium chloride 1 g tablet Take 1 tablet (1 g total) by mouth 2 (two) times daily with a meal for 14 days. 02/12/21 02/26/21  Kayleen Memos, DO  Sodium Chloride Flush (NORMAL SALINE FLUSH) 0.9 % SOLN  Use 5 MLs to flush the drain daily 01/12/21   Candiss Norse A, [name]  warfarin (COUMADIN) 5 MG tablet TAKE 1 TABLET BY MOUTH AS DIRECTED BY THE COUMADIN CLINIC Patient taking differently: Take 2.5-5 mg by mouth See admin instructions. Take 2.5mg  by mouth daily on Monday and Friday, then take 5mg  all other days or as directed by the Coumadin clinic 06/27/20   Burnell Blanks, MD    Allergies    Diltiazem, Sulfa antibiotics, and Sulfonamide derivatives  Review of Systems   Review of Systems  Constitutional:  Positive for activity change, fatigue and fever.  Respiratory:  Negative for shortness of breath.   Cardiovascular:  Negative for chest pain.  Gastrointestinal:  Positive for diarrhea and nausea. Negative for abdominal pain, blood in stool and vomiting.  Allergic/Immunologic: Negative for immunocompromised state.  Neurological:  Positive for dizziness.  Hematological:  Bruises/bleeds easily.  All other systems reviewed and are negative.  Physical Exam Updated Vital Signs BP 136/67   Pulse 85    Temp (!) 100.5 F (38.1 C) (Rectal)   Resp (!) 25   Ht 5\' 9"  (1.753 m)   Wt 76 kg   SpO2 97%   BMI 24.75 kg/m   Physical Exam Vitals and nursing note reviewed.  Constitutional:      Appearance: He is well-developed.  HENT:     Head: Atraumatic.     Mouth/Throat:     Mouth: Mucous membranes are dry.  Cardiovascular:     Rate and Rhythm: Normal rate.  Pulmonary:     Effort: Pulmonary effort is normal.  Musculoskeletal:     Cervical back: Neck supple.  Skin:    General: Skin is warm.  Neurological:     Mental Status: He is alert and oriented to person, place, and time.    ED Results / Procedures / Treatments   Labs (all labs ordered are listed, but only abnormal results are displayed) Labs Reviewed  COMPREHENSIVE METABOLIC PANEL - Abnormal; Notable for the following components:      Result Value   Sodium 133 (*)    Potassium 3.3 (*)    Chloride 97 (*)    Glucose, Bld 119 (*)    Calcium 8.1 (*)    Albumin 3.4 (*)    Total Bilirubin 1.3 (*)    All other components within normal limits  CBC - Abnormal; Notable for the following components:   RBC 3.33 (*)    Hemoglobin 10.4 (*)    HCT 32.3 (*)    RDW 17.4 (*)    All other components within normal limits  RESP PANEL BY RT-PCR (FLU A&B, COVID) [id number]  C DIFFICILE QUICK SCREEN W PCR REFLEX    LIPASE, BLOOD  URINALYSIS, ROUTINE W REFLEX MICROSCOPIC    EKG EKG Interpretation  Date/Time:  Sunday February 26 2021 22:41:57 EST Ventricular Rate:  91 PR Interval:    QRS Duration: 109 QT Interval:  371 QTC Calculation: 457 R Axis:   194 Text Interpretation: Atrial fibrillation Right axis deviation Low voltage, extremity and precordial leads Consider anterior infarct Nonspecific T abnormalities, lateral leads No acute changes Confirmed by Varney Biles [id number]) on 02/26/2021 10:58:59 PM  Radiology No results found.  Procedures Procedures   Medications Ordered in ED Medications  magnesium sulfate IVPB 2 g 50 mL  (2 g Intravenous New Bag/Given 02/26/21 2253)  potassium chloride SA (KLOR-CON M) CR tablet 40 mEq (40 mEq Oral Given 02/26/21 2240)  lactated ringers bolus 500 mL (  500 mLs Intravenous New Bag/Given 02/26/21 2254)  ondansetron (ZOFRAN) injection 4 mg (4 mg Intravenous Given 02/26/21 2243)    ED Course  I have reviewed the triage vital signs and the nursing notes.  Pertinent labs & imaging results that were available during my care of the patient were reviewed by me and considered in my medical decision making (see chart for details).    MDM Rules/Calculators/A&P                           87 year old male with multiple medical comorbidities and recent admission for C. difficile colitis comes in with chief complaint of diarrhea.  Patient was started on Lasix on Saturday.  He started developing diarrhea today.  He is feeling profoundly weak and having some dizziness.  It appears that he completed a course of Dificid as prescribed.  Diarrhea is as pronounced as when he had C. difficile, but quality of it does appear different to him.  Patient and family concerned about the nausea and inability for him to keep much down today.  On exam, there is no abdominal tenderness.  Patient is appearing dry.  Orthostatics will be ordered.  He is noted to have mild hypokalemia on the labs.  We will give him some IV fluid along with IV magnesium with presumed low mag given the profound diarrhea.    Final Clinical Impression(s) / ED Diagnoses Final diagnoses:  None    Rx / DC Orders ED Discharge Orders     None        Varney Biles, MD 02/26/21 2326

## 2021-02-26 NOTE — ED Notes (Signed)
Pt given water for PO challenge. Pt able to drink water and take potassium pills

## 2021-02-26 NOTE — ED Triage Notes (Signed)
Pt BIB [facility] from home. Pt had c dif two weeks ago and was discharged from the hospital two weeks ago. Pt was given a RX for lasix on Friday. After pt took his lasix this AM he had diarrhea all day, reporting 10 times having diarrhea. Pt endorses that he has not been able to eat or drink today.

## 2021-02-27 ENCOUNTER — Telehealth: Payer: Self-pay | Admitting: Internal Medicine

## 2021-02-27 ENCOUNTER — Observation Stay (HOSPITAL_BASED_OUTPATIENT_CLINIC_OR_DEPARTMENT_OTHER): Payer: Medicare Other

## 2021-02-27 DIAGNOSIS — E1159 Type 2 diabetes mellitus with other circulatory complications: Secondary | ICD-10-CM

## 2021-02-27 DIAGNOSIS — Z952 Presence of prosthetic heart valve: Secondary | ICD-10-CM

## 2021-02-27 DIAGNOSIS — I10 Essential (primary) hypertension: Secondary | ICD-10-CM

## 2021-02-27 DIAGNOSIS — I4819 Other persistent atrial fibrillation: Secondary | ICD-10-CM

## 2021-02-27 DIAGNOSIS — I5033 Acute on chronic diastolic (congestive) heart failure: Secondary | ICD-10-CM | POA: Diagnosis not present

## 2021-02-27 DIAGNOSIS — E876 Hypokalemia: Secondary | ICD-10-CM

## 2021-02-27 LAB — URINALYSIS, ROUTINE W REFLEX MICROSCOPIC
Bilirubin Urine: NEGATIVE
Glucose, UA: NEGATIVE mg/dL
Hgb urine dipstick: NEGATIVE
Ketones, ur: 5 mg/dL — AB
Leukocytes,Ua: NEGATIVE
Nitrite: NEGATIVE
Protein, ur: 100 mg/dL — AB
Specific Gravity, Urine: 1.016 (ref 1.005–1.030)
pH: 5 (ref 5.0–8.0)

## 2021-02-27 LAB — BASIC METABOLIC PANEL
Anion gap: 5 (ref 5–15)
BUN: 12 mg/dL (ref 8–23)
CO2: 24 mmol/L (ref 22–32)
Calcium: 7.4 mg/dL — ABNORMAL LOW (ref 8.9–10.3)
Chloride: 102 mmol/L (ref 98–111)
Creatinine, Ser: 0.91 mg/dL (ref 0.61–1.24)
GFR, Estimated: >60 mL/min (ref >60)
Glucose, Bld: 94 mg/dL (ref 70–99)
Potassium: 3.6 mmol/L (ref 3.5–5.1)
Sodium: 131 mmol/L — ABNORMAL LOW (ref 135–145)

## 2021-02-27 LAB — PROTIME-INR
INR: 3.8 — ABNORMAL HIGH (ref 0.8–1.2)
INR: 3.7 — ABNORMAL HIGH (ref 0.8–1.2)
Prothrombin Time: 37.4 seconds — ABNORMAL HIGH (ref 11.4–15.2)
Prothrombin Time: 36.7 seconds — ABNORMAL HIGH (ref 11.4–15.2)

## 2021-02-27 LAB — C DIFFICILE QUICK SCREEN W PCR REFLEX
C Diff antigen: NEGATIVE
C Diff antigen: POSITIVE — AB
C Diff interpretation: NOT DETECTED
C Diff toxin: NEGATIVE
C Diff toxin: NEGATIVE

## 2021-02-27 LAB — ECHOCARDIOGRAM COMPLETE
Area-P 1/2: 3.48 cm2
Calc EF: 44.3 %
Height: 69 in
MV VTI: 2.61 cm2
S' Lateral: 4 cm
Single Plane A2C EF: 48.2 %
Single Plane A4C EF: 43.8 %
Weight: 2681.6 oz

## 2021-02-27 LAB — MAGNESIUM: Magnesium: 1.7 mg/dL (ref 1.7–2.4)

## 2021-02-27 MED ORDER — BISOPROLOL FUMARATE 5 MG PO TABS
5.0000 mg | ORAL_TABLET | Freq: Every day | ORAL | Status: DC
  Administered 2021-02-27 – 2021-03-03 (×5): 5 mg via ORAL
  Filled 2021-02-27 (×5): qty 1

## 2021-02-27 MED ORDER — ACETAMINOPHEN 500 MG PO TABS
500.0000 mg | ORAL_TABLET | Freq: Four times a day (QID) | ORAL | Status: DC | PRN
  Filled 2021-02-27: qty 1

## 2021-02-27 MED ORDER — ATORVASTATIN CALCIUM 10 MG PO TABS
20.0000 mg | ORAL_TABLET | ORAL | Status: DC
  Administered 2021-02-27 – 2021-03-02 (×2): 20 mg via ORAL
  Filled 2021-02-27 (×2): qty 2

## 2021-02-27 MED ORDER — FIDAXOMICIN 200 MG PO TABS
200.0000 mg | ORAL_TABLET | Freq: Two times a day (BID) | ORAL | Status: DC
  Filled 2021-02-27: qty 1

## 2021-02-27 MED ORDER — MAGNESIUM SULFATE 2 GM/50ML IV SOLN
2.0000 g | Freq: Once | INTRAVENOUS | Status: AC
  Administered 2021-02-27: 2 g via INTRAVENOUS
  Filled 2021-02-27: qty 50

## 2021-02-27 MED ORDER — FE FUMARATE-B12-VIT C-FA-IFC PO CAPS
1.0000 | ORAL_CAPSULE | Freq: Every day | ORAL | Status: DC
  Administered 2021-02-27 – 2021-03-03 (×5): 1 via ORAL
  Filled 2021-02-27 (×5): qty 1

## 2021-02-27 MED ORDER — DIPYRIDAMOLE 25 MG PO TABS
50.0000 mg | ORAL_TABLET | Freq: Three times a day (TID) | ORAL | Status: DC
  Administered 2021-02-28 – 2021-03-03 (×11): 50 mg via ORAL
  Filled 2021-02-27: qty 1
  Filled 2021-02-27 (×14): qty 2
  Filled 2021-02-27: qty 1

## 2021-02-27 MED ORDER — ASPIRIN EC 81 MG PO TBEC
81.0000 mg | DELAYED_RELEASE_TABLET | Freq: Every day | ORAL | Status: DC
  Administered 2021-02-27 – 2021-03-03 (×5): 81 mg via ORAL
  Filled 2021-02-27 (×5): qty 1

## 2021-02-27 MED ORDER — DIPHENOXYLATE-ATROPINE 2.5-0.025 MG PO TABS
1.0000 | ORAL_TABLET | Freq: Four times a day (QID) | ORAL | Status: DC | PRN
  Administered 2021-02-27 – 2021-02-28 (×3): 1 via ORAL
  Filled 2021-02-27 (×3): qty 1

## 2021-02-27 MED ORDER — WARFARIN - PHARMACIST DOSING INPATIENT: Freq: Every day | Status: DC

## 2021-02-27 MED ORDER — SACCHAROMYCES BOULARDII 250 MG PO CAPS
250.0000 mg | ORAL_CAPSULE | Freq: Two times a day (BID) | ORAL | Status: DC
  Administered 2021-02-27 – 2021-03-03 (×10): 250 mg via ORAL
  Filled 2021-02-27 (×11): qty 1

## 2021-02-27 MED ORDER — POTASSIUM CHLORIDE 10 MEQ/100ML IV SOLN
10.0000 meq | INTRAVENOUS | Status: AC
  Administered 2021-02-27 (×2): 10 meq via INTRAVENOUS
  Filled 2021-02-27 (×2): qty 100

## 2021-02-27 MED ORDER — FUROSEMIDE 10 MG/ML IJ SOLN
60.0000 mg | Freq: Every day | INTRAMUSCULAR | Status: DC
  Administered 2021-02-27 – 2021-02-28 (×2): 60 mg via INTRAVENOUS
  Filled 2021-02-27: qty 6
  Filled 2021-02-27: qty 8
  Filled 2021-02-27: qty 6

## 2021-02-27 MED ORDER — VITAMIN D 25 MCG (1000 UNIT) PO TABS
2000.0000 [IU] | ORAL_TABLET | Freq: Every day | ORAL | Status: DC
  Administered 2021-02-27 – 2021-03-03 (×5): 2000 [IU] via ORAL
  Filled 2021-02-27 (×5): qty 2

## 2021-02-27 MED ORDER — LATANOPROST 0.005 % OP SOLN
1.0000 [drp] | Freq: Every day | OPHTHALMIC | Status: DC
  Administered 2021-02-27 – 2021-03-02 (×5): 1 [drp] via OPHTHALMIC
  Filled 2021-02-27: qty 2.5

## 2021-02-27 MED ORDER — POTASSIUM CHLORIDE CRYS ER 20 MEQ PO TBCR
40.0000 meq | EXTENDED_RELEASE_TABLET | Freq: Once | ORAL | Status: AC
  Administered 2021-02-27: 40 meq via ORAL
  Filled 2021-02-27: qty 2

## 2021-02-27 MED ORDER — ALLOPURINOL 300 MG PO TABS
300.0000 mg | ORAL_TABLET | Freq: Every day | ORAL | Status: DC
  Administered 2021-02-27 – 2021-03-03 (×5): 300 mg via ORAL
  Filled 2021-02-27 (×5): qty 1

## 2021-02-27 NOTE — ED Notes (Signed)
Patient noted to have had a 5 beat run of vTach. Mid-level paged

## 2021-02-27 NOTE — Progress Notes (Signed)
  Echocardiogram 2D Echocardiogram has been performed.  Bobbye Charleston 02/27/2021, 3:29 PM

## 2021-02-27 NOTE — H&P (Signed)
History and Physical    Anthony Gould KDX:[id number] DOB: 12/11/1933 DOA: 02/26/2021  PCP: Cassandria Anger, MD  Patient coming from: Home  I have personally briefly reviewed patient's old medical records in [place]  Chief Complaint: diarrhea  HPI: Anthony Gould is a 87 y.o. male with medical history significant for severe mitral regurgitation s/p MVR in 1986 on warfarin, permanent atrial fibrillation, diastolic CHF, pulmonary hypertension, hypertension, hyperlipidemia, type 2 diabetes, CKD stage III, TIA who presents with persistent diarrhea.  Patient had acute appendicitis and underwent appendectomy in September.  He then had persistent pain in the right lower quadrant and was readmitted in October with infected hematoma/abscess with culture growing E. coli treated with antibiotics.  He then presented in November with persistent diarrhea and found to have severe sepsis secondary to C. difficile colitis.  Initially was treated with p.o. vancomycin and IV Flagyl but regimen was changed to fidaxomicin since previous treatment interfered with his Coumadin.He completed 10 day course.  Since discharge he returned to having normal bowel movement.  However in the last few days he beginning having increase LE edema bilateral. Presented to his cardiology and had his Lasix increased from 40 daily to BID. He started new dose yesterday. Then this morning he was straining to have a bowel movement but then progressively throughout the day began to have profuse diarrhea up to 10 episodes at home and decided to present to the ED. He otherwise denies abdominal pain.  No nausea or vomiting.  Reports a temperature of 100.5 at home.  Denies any chest pain or shortness of breath with his lower extremity edema.  ED Course: He was febrile with temperature 100.5, heart rate of 99, normotensive on room air. No leukocytosis, hemoglobin 10.4 Sodium of 133, K of 3.3, creatinine of 0.93  Flu/COVID  PCR negative  Review of Systems: Constitutional: No Weight Change, + Fever ENT/Mouth: No sore throat, No Rhinorrhea Eyes: No Vision Changes Cardiovascular: No Chest Pain, no SOB Respiratory: No Cough, No Sputum  Gastrointestinal: No Nausea, No Vomiting, + Diarrhea, No Constipation, No Pain Genitourinary: no Urinary Incontinence Musculoskeletal: No Arthralgias, No Myalgias Skin: No Skin Lesions, No Pruritus Neuro: no Weakness, No Numbness Psych: No Anxiety/Panic, No Depression, no decrease appetite Heme/Lymph: No Bruising, No Bleeding  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   NSVT (nonsustained ventricular tachycardia)    Peripheral edema    venous insufficiency   Permanent atrial fibrillation (HCC)    Pulmonary hypertension (HCC)    Syncope    TIA (transient ischemic attack)    while on coumadin   Type II or unspecified type diabetes mellitus without mention of complication, not stated as uncontrolled    lifestyle mangement   Ventricular ectopy    symptomatic    Past Surgical History:  Procedure Laterality Date   CHOLECYSTECTOMY, LAPAROSCOPIC  2003   IR PATIENT EVAL TECH 0-60 MINS  01/09/2021   IR RADIOLOGIST EVAL & MGMT  01/19/2021   IR RADIOLOGIST EVAL & MGMT  02/02/2021   LAPAROSCOPIC APPENDECTOMY N/A 12/05/2020   Procedure: APPENDECTOMY LAPAROSCOPIC;  Surgeon: Stark Klein, MD;  Location: [facility];  Service: General;  Laterality: N/A;   MITRAL VALVE REPLACEMENT  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     reports that he has never smoked. He has never used smokeless tobacco. He reports that he does not drink alcohol and does not use drugs. Social History  Allergies  Allergen Reactions   Diltiazem Other (See Comments)    "Bradycardia," per  pt; tolerates Amlodipine   Sulfa Antibiotics Rash and Other (See Comments)    Reaction not fully recalled    Sulfonamide Derivatives Rash and Other (See Comments)    Reaction not fully recalled     Family History  Problem Relation Age of Onset   Coronary artery disease Father    Diabetes Other    Coronary artery disease Other    Prostate cancer Neg Hx    Colon cancer Neg Hx      Prior to Admission medications   Medication Sig Start Date End Date Taking? Authorizing Provider  acetaminophen (TYLENOL) 500 MG tablet Take 500 mg by mouth every 6 (six) hours as needed for mild pain or headache.    [provider]  allopurinol (ZYLOPRIM) 300 MG tablet TAKE 1 TABLET BY MOUTH DAILY AS NEEDED. GENERIC EQUIVALENT FOR ZYLOPRIM 04/14/20   Plotnikov, Evie Lacks, MD  aspirin 81 MG EC tablet Take 81 mg by mouth daily.    [provider]  atorvastatin (LIPITOR) 20 MG tablet TAKE 1 TABLET BY MOUTH TWO TIMES A WEEK 04/14/20   Plotnikov, Evie Lacks, MD  bisoprolol (ZEBETA) 5 MG tablet Take 1 tablet (5 mg total) by mouth daily. Please keep upcoming appt for future refills. Thank you 01/13/21   Burnell Blanks, MD  Cholecalciferol (VITAMIN D3) 50 MCG (2000 UT) capsule TAKE 1 CAPSULE BY MOUTH EVERY DAY 02/23/21   Plotnikov, Evie Lacks, MD  dipyridamole (PERSANTINE) 50 MG tablet Take 1 tablet (50 mg total) by mouth 3 (three) times daily. 08/08/20   Burnell Blanks, MD  feeding supplement (ENSURE ENLIVE / ENSURE PLUS) LIQD Take 237 mLs by mouth 2 (two) times daily between meals. 01/03/21   Lavina Hamman, MD  ferrous [id number] C-folic acid (TRINSICON / FOLTRIN) capsule Take 1 capsule by mouth 2 (two) times daily after a meal. 12/18/20   Lorella Nimrod, MD  furosemide (LASIX) 40 MG tablet TAKE 1 TABLET BY MOUTH DAILY GENERIC EQUIVALENT FOR LASIX 07/04/20   Plotnikov, Evie Lacks, MD  latanoprost (XALATAN) 0.005 % ophthalmic solution Place 1 drop into both eyes at bedtime.     [provider]  saccharomyces boulardii (FLORASTOR) 250 MG capsule Take 1 capsule (250 mg total) by mouth 2 (two) times daily. 02/12/21 05/13/21  Kayleen Memos, DO  Sodium Chloride Flush (NORMAL SALINE FLUSH) 0.9 % SOLN Use 5 MLs to flush the drain daily 01/12/21   Candiss Norse A, [name]  warfarin (COUMADIN) 5 MG tablet TAKE 1 TABLET BY MOUTH AS DIRECTED BY THE COUMADIN CLINIC Patient taking differently: Take 2.5-5 mg by mouth See admin instructions. Take 2.5mg  by mouth daily on Monday and Friday, then take 5mg  all other days or as directed by the Coumadin clinic 06/27/20   Burnell Blanks, MD    Physical Exam: Vitals:   02/26/21 2042 02/26/21 2142 02/26/21 2200 02/26/21 2238  BP:  (!) 144/70 136/67   Pulse:  99 85   Resp:  (!) 27 (!) 25   Temp:    (!) 100.5 F (38.1 C)  TempSrc:    Rectal  SpO2:  91% 97%   Weight: 76 kg  Height: 5\' 9"  (1.753 m)       Constitutional: NAD, calm, comfortable elderly male lying flat in bed Vitals:   02/26/21 2042 02/26/21 2142 02/26/21 2200 02/26/21 2238  BP:  (!) 144/70 136/67   Pulse:  99 85   Resp:  (!) 27 (!) 25   Temp:    (!) 100.5 F (38.1 C)  TempSrc:    Rectal  SpO2:  91% 97%   Weight: 76 kg     Height: 5\' 9"  (1.753 m)      Eyes:  lids and conjunctivae normal ENMT: Mucous membranes are moist. Neck: normal, supple Respiratory: clear to auscultation bilaterally, no wheezing, no crackles. Normal respiratory effort. No accessory muscle use.  Cardiovascular: Regular rate and rhythm, no murmurs / rubs / gallops.  +3 pitting edema of the distal anterior thigh.   Abdomen:soft, non-distended, no tenderness, no masses palpated.  Musculoskeletal: no clubbing / cyanosis.  Normal muscle tone.  Skin: no rashes, lesions, ulcers. No induration Neurologic: CN 2-12 grossly intact. Strength 5/5 in all 4.  Psychiatric: Normal judgment and insight. Alert and oriented x 3. Normal mood.     Labs on Admission: I have personally  reviewed following labs and imaging studies  CBC: Recent Labs  Lab 02/26/21 2105  WBC 6.5  HGB 10.4*  HCT 32.3*  MCV 97.0  PLT 528   Basic Metabolic Panel: Recent Labs  Lab 02/26/21 2105  NA 133*  K 3.3*  CL 97*  CO2 25  GLUCOSE 119*  BUN 14  CREATININE 0.93  CALCIUM 8.1*   GFR: Estimated Creatinine Clearance: 58.1 mL/min (by C-G formula based on SCr of 0.93 mg/dL). Liver Function Tests: Recent Labs  Lab 02/26/21 2105  AST 30  ALT 15  ALKPHOS 72  BILITOT 1.3*  PROT 6.7  ALBUMIN 3.4*   Recent Labs  Lab 02/26/21 2105  LIPASE 27   No results for input(s): AMMONIA in the last 168 hours. Coagulation Profile: Recent Labs  Lab 02/22/21 0000  INR 3.5*   Cardiac Enzymes: No results for input(s): CKTOTAL, CKMB, CKMBINDEX, TROPONINI in the last 168 hours. BNP (last 3 results) No results for input(s): PROBNP in the last 8760 hours. HbA1C: No results for input(s): HGBA1C in the last 72 hours. CBG: No results for input(s): GLUCAP in the last 168 hours. Lipid Profile: No results for input(s): CHOL, HDL, LDLCALC, TRIG, CHOLHDL, LDLDIRECT in the last 72 hours. Thyroid Function Tests: No results for input(s): TSH, T4TOTAL, FREET4, T3FREE, THYROIDAB in the last 72 hours. Anemia Panel: No results for input(s): VITAMINB12, FOLATE, FERRITIN, TIBC, IRON, RETICCTPCT in the last 72 hours. Urine analysis:    Component Value Date/Time   COLORURINE YELLOW 02/04/2021 0229   APPEARANCEUR CLEAR 02/04/2021 0229   LABSPEC 1.040 (H) 02/04/2021 0229   PHURINE 5.0 02/04/2021 0229   GLUCOSEU NEGATIVE 02/04/2021 0229   GLUCOSEU NEGATIVE 12/28/2020 0840   HGBUR SMALL (A) 02/04/2021 0229   BILIRUBINUR NEGATIVE 02/04/2021 0229   KETONESUR NEGATIVE 02/04/2021 0229   PROTEINUR 30 (A) 02/04/2021 0229   UROBILINOGEN 0.2 12/28/2020 0840   NITRITE NEGATIVE 02/04/2021 0229   LEUKOCYTESUR NEGATIVE 02/04/2021 0229    Radiological Exams on Admission: No results  found.    Assessment/Plan  Persistent diarrhea -History of recent C. difficile.  Repeat C. difficile testing is negative.  Possibly secondary to enteritis but low threshold to repeat testing if symptoms persist  -PRN Lomotil for loose stool   Hypokalemia - K of 3.3. Replete with both  oral and IV supplementation x 2  Acute on chronic diastolic heart failure -Recently saw cardiology on 12/2 and had Lasix increased to from 40mg  daily to 40 mg BID -significant bilateral LE edema up to thigh -continue daily IV 60 mg Lasix -Monitor intake and output - Daily weights  Persistent atrial fibrillation - Continue bisoprolol  Severe mitral regurgitation S/p mechanical mitral valve replacement Continue aspirin and Coumadin per pharmacy. Check INR.  Goal of 3-3.5  Hypertension -Continue bisoprolol  HLD -Continue statin  Diet controlled type 2 diabetes - A1c 4.9 -02/04/21  DVT prophylaxis:.warfarin Code Status: Full Family Communication: Plan discussed with patient at bedside  disposition Plan: Home with observation Consults called:  Admission status: Observation  Level of care: Med-Surg  Status is: Observation  The patient remains OBS appropriate and will d/c before 2 midnights.        Orene Desanctis DO [facility]   If 7PM-7AM, please contact night-coverage [email or web address]   02/27/2021, 12:03 AM

## 2021-02-27 NOTE — Progress Notes (Signed)
ANTICOAGULATION CONSULT NOTE - Initial Consult  Pharmacy Consult for warfarin Indication:  mechanical valve  Allergies  Allergen Reactions   Diltiazem Other (See Comments)    "Bradycardia," per pt; tolerates Amlodipine   Sulfa Antibiotics Rash and Other (See Comments)    Reaction not fully recalled    Sulfonamide Derivatives Rash and Other (See Comments)    Reaction not fully recalled     Patient Measurements: Height: 5\' 9"  (175.3 cm) Weight: 76 kg (167 lb 9.6 oz) IBW/kg (Calculated) : 70.7  Vital Signs: Temp: 100.5 F (38.1 C) (12/04 2238) Temp Source: Rectal (12/04 2238) BP: 136/67 (12/04 2200) Pulse Rate: 85 (12/04 2200)  Labs: Recent Labs    02/26/21 2105 02/27/21 0020  HGB 10.4*  --   HCT 32.3*  --   PLT 164  --   LABPROT  --  37.4*  INR  --  3.8*  CREATININE 0.93  --     Estimated Creatinine Clearance: 58.1 mL/min (by C-G formula based on SCr of 0.93 mg/dL).   Medical History: 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   NSVT (nonsustained ventricular tachycardia)    Peripheral edema    venous insufficiency   Permanent atrial fibrillation (HCC)    Pulmonary hypertension (HCC)    Syncope    TIA (transient ischemic attack)    while on coumadin   Type II or unspecified type diabetes mellitus without mention of complication, not stated as uncontrolled    lifestyle mangement   Ventricular ectopy    symptomatic    Assessment: 87 y.o. male with medical history significant for severe mitral regurgitation s/p MVR in 1986 on warfarin, permanent atrial fibrillation, diastolic CHF, pulmonary hypertension, hypertension, hyperlipidemia, type 2 diabetes, CKD stage III, TIA who presents with persistent diarrhea.  Warfarin per pharmacy  Home dose  warfarin 2.5mg  on Mon and Friday, 5mg  all other days.  LD 12/4 @ 0800  INR 3.8, Hgb 10.4, Plts 164  Goal of Therapy:  INR 3-3.5    Plan:  Obtain INR with am labs and dose warfarin accordingly  Dolly Rias RPh 02/27/2021, 1:25 AM

## 2021-02-27 NOTE — Progress Notes (Addendum)
Same day note  Patient seen and examined at bedside.  Patient was admitted to the hospital for diarrhea.  At the time of my evaluation, patient complains of diarrhea.  Denies any nausea vomiting shortness of breath or chest pain.  Feels gaseous.  Physical examination reveals a lean male, not in obvious distress on nasal cannula oxygen, abdomen is soft.  Bilateral lower extremity peripheral edema noted.  Laboratory data and imaging was reviewed  Assessment and Plan.  Diarrhea with volume depletion.  Fever of 100.5 F.  Recent history of C. difficile positive.  Repeat testing is negative.  On as needed antimotility agents.  We will check a GI pathogen panel.  Nonsustained V. tach.  Patient was asymptomatic.  Electrolytes have been replaced.  We will continue to replenish potassium and magnesium.  Continue telemetry monitor.  Check 2D echocardiogram.  Add telemetry monitor.  Hypokalemia.  Replenished.  Check levels in a.m.  We will repeat magnesium sulfate and p.o. potassium today.  Check levels in a.m.  mild acute on chronic diastolic heart failure.  Lasix was increased from 40 daily to 40 twice daily by cardiology on 02/24/2021.  On IV 60 daily while in the hospital.  Continue intake and output charting Daily weights.  Check 2D echocardiogram.  Persistent atrial fibrillation.  On bisoprolol.  Severe mitral regurgitation status post mechanical valve replacement. On aspirin and Coumadin.  Pharmacy has been consulted.  Hypertension on bisoprolol.  Continue to monitor blood pressure.  Hyperlipidemia continue statins  Diet-controlled diabetes.  Latest hemoglobin A1c of 4.9 from 02/04/2021.  Debility, weakness.  We will get PT evaluation.  No Charge  Signed,  Delila Pereyra, MD [facility]

## 2021-02-27 NOTE — ED Notes (Signed)
Received message from dayshift attending, MD updated on patient and status.

## 2021-02-27 NOTE — Progress Notes (Signed)
ANTICOAGULATION CONSULT NOTE - Initial Consult  Pharmacy Consult for warfarin Indication:  mechanical valve  Allergies  Allergen Reactions   Diltiazem Other (See Comments)    "Bradycardia," per pt; tolerates Amlodipine   Sulfa Antibiotics Rash and Other (See Comments)    Reaction not fully recalled    Sulfonamide Derivatives Rash and Other (See Comments)    Reaction not fully recalled     Patient Measurements: Height: 5\' 9"  (175.3 cm) Weight: 76 kg (167 lb 9.6 oz) IBW/kg (Calculated) : 70.7  Vital Signs: Temp: 100.5 F (38.1 C) (12/04 2238) Temp Source: Rectal (12/04 2238) BP: 129/58 (12/05 0900) Pulse Rate: 71 (12/05 0900)  Labs: Recent Labs    02/26/21 2105 02/27/21 0020 02/27/21 0426 02/27/21 0702  HGB 10.4*  --   --   --   HCT 32.3*  --   --   --   PLT 164  --   --   --   LABPROT  --  37.4* 36.7*  --   INR  --  3.8* 3.7*  --   CREATININE 0.93  --   --  0.91     Estimated Creatinine Clearance: 59.3 mL/min (by C-G formula based on SCr of 0.91 mg/dL).   Medical History: 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   NSVT (nonsustained ventricular tachycardia)    Peripheral edema    venous insufficiency   Permanent atrial fibrillation (HCC)    Pulmonary hypertension (HCC)    Syncope    TIA (transient ischemic attack)    while on coumadin   Type II or unspecified type diabetes mellitus without mention of complication, not stated as uncontrolled    lifestyle mangement   Ventricular ectopy    symptomatic    Assessment: 87 y.o. male with medical history significant for severe mitral regurgitation s/p MVR in 1986 on warfarin, permanent atrial fibrillation, diastolic CHF, pulmonary hypertension, hypertension, hyperlipidemia, type 2 diabetes, CKD  stage III, TIA who presents with persistent diarrhea.  Warfarin per pharmacy  Home dose warfarin 2.5mg  on Mon and Friday, 5mg  all other days.  LD 12/4 @ 0800  02/27/21 10:34 AM  - INR 3.7 - no bleeding reported   Goal of Therapy:  INR 3-3.5   Plan:  Hold warfarin today and f/u AM INR  Ulice Dash, PharmD  02/27/2021, 10:33 AM

## 2021-02-27 NOTE — Telephone Encounter (Signed)
Caller connected to [facility] 12.4.2022.   Caller states his dad was release from the hospital 2 weeks ago he has been there for C Diff infection and today he is showing signs and symptoms again, diarrhea not feeling well. Caller states he cant stand up with having diarrhea. Caller states he had left over medicine and wants to know if he could get it.   Please advise.   Callback #- [phone]

## 2021-02-27 NOTE — ED Notes (Signed)
No new orders concerning patient's vtach, have not heard from attending. Will continue to monitor.

## 2021-02-28 ENCOUNTER — Telehealth: Payer: Self-pay | Admitting: Cardiovascular Disease

## 2021-02-28 ENCOUNTER — Other Ambulatory Visit (HOSPITAL_COMMUNITY): Payer: Self-pay

## 2021-02-28 ENCOUNTER — Ambulatory Visit: Payer: Medicare Other | Admitting: Physician Assistant

## 2021-02-28 DIAGNOSIS — N1831 Chronic kidney disease, stage 3a: Secondary | ICD-10-CM | POA: Diagnosis present

## 2021-02-28 DIAGNOSIS — I5082 Biventricular heart failure: Secondary | ICD-10-CM | POA: Diagnosis present

## 2021-02-28 DIAGNOSIS — E876 Hypokalemia: Secondary | ICD-10-CM | POA: Diagnosis present

## 2021-02-28 DIAGNOSIS — I4821 Permanent atrial fibrillation: Secondary | ICD-10-CM | POA: Diagnosis present

## 2021-02-28 DIAGNOSIS — I472 Ventricular tachycardia, unspecified: Secondary | ICD-10-CM | POA: Diagnosis not present

## 2021-02-28 DIAGNOSIS — Z952 Presence of prosthetic heart valve: Secondary | ICD-10-CM | POA: Diagnosis not present

## 2021-02-28 DIAGNOSIS — Z7982 Long term (current) use of aspirin: Secondary | ICD-10-CM | POA: Diagnosis not present

## 2021-02-28 DIAGNOSIS — E871 Hypo-osmolality and hyponatremia: Secondary | ICD-10-CM | POA: Diagnosis present

## 2021-02-28 DIAGNOSIS — E1159 Type 2 diabetes mellitus with other circulatory complications: Secondary | ICD-10-CM | POA: Diagnosis not present

## 2021-02-28 DIAGNOSIS — I272 Pulmonary hypertension, unspecified: Secondary | ICD-10-CM | POA: Diagnosis present

## 2021-02-28 DIAGNOSIS — R197 Diarrhea, unspecified: Secondary | ICD-10-CM | POA: Insufficient documentation

## 2021-02-28 DIAGNOSIS — Z7901 Long term (current) use of anticoagulants: Secondary | ICD-10-CM | POA: Diagnosis not present

## 2021-02-28 DIAGNOSIS — I10 Essential (primary) hypertension: Secondary | ICD-10-CM | POA: Diagnosis not present

## 2021-02-28 DIAGNOSIS — I5033 Acute on chronic diastolic (congestive) heart failure: Secondary | ICD-10-CM | POA: Diagnosis present

## 2021-02-28 DIAGNOSIS — E1122 Type 2 diabetes mellitus with diabetic chronic kidney disease: Secondary | ICD-10-CM | POA: Diagnosis present

## 2021-02-28 DIAGNOSIS — A0471 Enterocolitis due to Clostridium difficile, recurrent: Secondary | ICD-10-CM | POA: Diagnosis present

## 2021-02-28 DIAGNOSIS — I340 Nonrheumatic mitral (valve) insufficiency: Secondary | ICD-10-CM | POA: Diagnosis present

## 2021-02-28 DIAGNOSIS — E785 Hyperlipidemia, unspecified: Secondary | ICD-10-CM | POA: Diagnosis present

## 2021-02-28 DIAGNOSIS — Z888 Allergy status to other drugs, medicaments and biological substances status: Secondary | ICD-10-CM | POA: Diagnosis not present

## 2021-02-28 DIAGNOSIS — I130 Hypertensive heart and chronic kidney disease with heart failure and stage 1 through stage 4 chronic kidney disease, or unspecified chronic kidney disease: Secondary | ICD-10-CM | POA: Diagnosis present

## 2021-02-28 DIAGNOSIS — Z8673 Personal history of transient ischemic attack (TIA), and cerebral infarction without residual deficits: Secondary | ICD-10-CM | POA: Diagnosis not present

## 2021-02-28 DIAGNOSIS — Z20822 Contact with and (suspected) exposure to covid-19: Secondary | ICD-10-CM | POA: Diagnosis present

## 2021-02-28 DIAGNOSIS — Z833 Family history of diabetes mellitus: Secondary | ICD-10-CM | POA: Diagnosis not present

## 2021-02-28 DIAGNOSIS — K7460 Unspecified cirrhosis of liver: Secondary | ICD-10-CM | POA: Diagnosis present

## 2021-02-28 DIAGNOSIS — I4819 Other persistent atrial fibrillation: Secondary | ICD-10-CM | POA: Diagnosis not present

## 2021-02-28 DIAGNOSIS — Z8249 Family history of ischemic heart disease and other diseases of the circulatory system: Secondary | ICD-10-CM | POA: Diagnosis not present

## 2021-02-28 DIAGNOSIS — Z882 Allergy status to sulfonamides status: Secondary | ICD-10-CM | POA: Diagnosis not present

## 2021-02-28 LAB — GASTROINTESTINAL PANEL BY PCR, STOOL (REPLACES STOOL CULTURE)

## 2021-02-28 LAB — CBC
HCT: 28.6 % — ABNORMAL LOW (ref 39.0–52.0)
Hemoglobin: 9.1 g/dL — ABNORMAL LOW (ref 13.0–17.0)
MCH: 30.6 pg (ref 26.0–34.0)
MCHC: 31.8 g/dL (ref 30.0–36.0)
MCV: 96.3 fL (ref 80.0–100.0)
Platelets: 141 K/uL — ABNORMAL LOW (ref 150–400)
RBC: 2.97 MIL/uL — ABNORMAL LOW (ref 4.22–5.81)
RDW: 17.3 % — ABNORMAL HIGH (ref 11.5–15.5)
WBC: 3.4 K/uL — ABNORMAL LOW (ref 4.0–10.5)
nRBC: 0 % (ref 0.0–0.2)

## 2021-02-28 LAB — COMPREHENSIVE METABOLIC PANEL WITH GFR
ALT: 11 U/L (ref 0–44)
AST: 24 U/L (ref 15–41)
Albumin: 2.5 g/dL — ABNORMAL LOW (ref 3.5–5.0)
Alkaline Phosphatase: 53 U/L (ref 38–126)
Anion gap: 5 (ref 5–15)
BUN: 13 mg/dL (ref 8–23)
CO2: 26 mmol/L (ref 22–32)
Calcium: 7.7 mg/dL — ABNORMAL LOW (ref 8.9–10.3)
Chloride: 101 mmol/L (ref 98–111)
Creatinine, Ser: 0.66 mg/dL (ref 0.61–1.24)
GFR, Estimated: >60 mL/min
Glucose, Bld: 72 mg/dL (ref 70–99)
Potassium: 3.8 mmol/L (ref 3.5–5.1)
Sodium: 132 mmol/L — ABNORMAL LOW (ref 135–145)
Total Bilirubin: 1 mg/dL (ref 0.3–1.2)
Total Protein: 5.4 g/dL — ABNORMAL LOW (ref 6.5–8.1)

## 2021-02-28 LAB — CLOSTRIDIUM DIFFICILE BY PCR, REFLEXED: Toxigenic C. Difficile by PCR: POSITIVE — AB

## 2021-02-28 LAB — PHOSPHORUS: Phosphorus: 2.2 mg/dL — ABNORMAL LOW (ref 2.5–4.6)

## 2021-02-28 LAB — MAGNESIUM: Magnesium: 1.9 mg/dL (ref 1.7–2.4)

## 2021-02-28 LAB — PROTIME-INR
INR: 3.4 — ABNORMAL HIGH (ref 0.8–1.2)
Prothrombin Time: 34.4 seconds — ABNORMAL HIGH (ref 11.4–15.2)

## 2021-02-28 MED ORDER — POTASSIUM PHOSPHATES 15 MMOLE/5ML IV SOLN
30.0000 mmol | Freq: Once | INTRAVENOUS | Status: AC
  Administered 2021-02-28: 30 mmol via INTRAVENOUS
  Filled 2021-02-28: qty 10

## 2021-02-28 MED ORDER — FIDAXOMICIN 200 MG PO TABS
200.0000 mg | ORAL_TABLET | Freq: Two times a day (BID) | ORAL | Status: DC
  Administered 2021-02-28 – 2021-03-03 (×7): 200 mg via ORAL
  Filled 2021-02-28 (×7): qty 1

## 2021-02-28 MED ORDER — VANCOMYCIN HCL 125 MG PO CAPS: 125.0000 mg | ORAL_CAPSULE | Freq: Four times a day (QID) | ORAL | Status: DC

## 2021-02-28 MED ORDER — WARFARIN SODIUM 5 MG PO TABS
5.0000 mg | ORAL_TABLET | Freq: Once | ORAL | Status: AC
  Administered 2021-02-28: 5 mg via ORAL
  Filled 2021-02-28: qty 1

## 2021-02-28 MED ORDER — FIDAXOMICIN 200 MG PO TABS: 200.0000 mg | ORAL_TABLET | ORAL | Status: DC

## 2021-02-28 MED ORDER — SACUBITRIL-VALSARTAN 24-26 MG PO TABS
1.0000 | ORAL_TABLET | Freq: Two times a day (BID) | ORAL | Status: DC
  Administered 2021-02-28 – 2021-03-03 (×6): 1 via ORAL
  Filled 2021-02-28 (×6): qty 1

## 2021-02-28 NOTE — Progress Notes (Addendum)
PROGRESS NOTE  Anthony Gould MGN:[id number] DOB: Jul 08, 1933 DOA: 02/26/2021 PCP: Cassandria Anger, MD   LOS: 0 days   Brief narrative:  Anthony Gould is a 87 y.o. male with past medical history of severe mitral regurgitation status post MVR on Coumadin, permanent atrial fibrillation, diastolic congestive heart failure, pulmonary hypertension,  CKD stage III, TIA who presents with persis presented hospital with multiple episodes of watery diarrhea at home with fever.  Patient stated that he did have around 10 episodes of severe diarrhea on Saturday.  Of note patient has been in the hospital recently few times initially with acute appendicitis and had appendectomy followed by infected hematoma and abscess and had been on antibiotic.  Patient had developed C. difficile colitis and was initially treated with p.o. vancomycin and Flagyl and was subsequently changed to rifaximin since it interfered with Coumadin.  He had completed 10-day course in the past and had normal bowel movements for few days.  But for the last few days prior to this presentation patient started having significant diarrhea.  Of note patient had recently been seen by cardiology as outpatient and Lasix dose was increased from 40 daily to twice daily.  In the ED patient was febrile with a temperature of 100.5 F.  He however did not have leukocytosis, sodium of 133, K of 3.3, creatinine of 0.93.  She was then considered for admission to hospital for further evaluation and treatment.   Assessment/Plan:  Principal Problem:   Hypokalemia Active Problems:   DM2 (diabetes mellitus, type 2) (HCC)   Essential hypertension   H/O mitral valve replacement with mechanical valve   Acute on chronic diastolic CHF (congestive heart failure) (HCC)   Persistent atrial fibrillation (HCC)   Diarrhea with volume depletion.  Possible second episode of C. difficile diarrhea.  Fever of 100.5 F on presentation. Had 10 episodes per day at  home prior to coming to the hospital with fever.  Patient has poor appetite.  Was on clears will increase to soft diet today.  Spoke with the pharmacy.  Will be resumed on Dificid.  We will continue to monitor closely.   Nonsustained V. tach.  Patient was asymptomatic.  Electrolytes have been replaced.  We will continue to replenish potassium and magnesium.  Continue telemetry monitor.  2D echocardiogram with mildly reduced LV function.  Continue to replenish electrolytes.  Was seen by cardiology as outpatient in the past.  Hypophosphatemia.  We will replenish through IV. Check levels in am.  Hypokalemia.  Replenished.  Improved.  Monitor levels in a.m.  Hyponatremia.  Was recently on sodium tablets as outpatient.  Due to heart failure will hold.  On IV diuretics.  We will continue to monitor.   Mild acute on chronic diastolic heart failure.  Lasix was increased from 40 daily to 40 twice daily by cardiology on 02/24/2021.  On Lasix IV 60 mg daily while in the hospital.   Continue intake and output charting.  Daily weights.  2D echocardiogram with LV ejection fraction of 40 to 45%.  We will consult cardiology for further evaluation.   Persistent atrial fibrillation.  On bisoprolol.  Rate controlled at this time.   Severe mitral regurgitation status post mechanical valve replacement. On aspirin and Coumadin.  Pharmacy on board   Hypertension on bisoprolol.  Continue to monitor blood pressure.  Blood pressure seems to be stable at this time.  Hyperlipidemia continue statins  Diet-controlled diabetes.  Latest hemoglobin A1c of 4.9 from 02/04/2021.  No need for supplemental insulin at this time.   Debility, weakness.  We will get PT evaluation. Pending evaluation  DVT prophylaxis:  warfarin (COUMADIN) tablet 5 mg   Code Status: Full code  Family Communication: I spoke with the patient's son and wife on the phone and updated him about the clinical condition of the patient.  They are very  worried about him being admitted to hospital multiple times and in the last discharge he was very weak. Son wants to make sure that Patient was doing well before coming home.  Discussed about potential need for rehabilitation but PT is pending at this time.  Status is: Observation  The patient will require care spanning > 2 midnights and should be moved to inpatient because: Symptomatic C. difficile with ongoing diarrhea, IV diuretics lites deconditioning and debility, needing PT evaluation.    Consultants: Cardiology  Procedures:  none  Anti-infectives:  Dificid 12/12>  Anti-infectives (From admission, onward)    Start     Dose/Rate Route Frequency Ordered Stop   03/06/21 1000  fidaxomicin (DIFICID) tablet 200 mg       See Hyperspace for full Linked Orders Report.   200 mg Oral Every other day 02/28/21 1106 03/26/21 0959   02/28/21 1200  fidaxomicin (DIFICID) tablet 200 mg       See Hyperspace for full Linked Orders Report.   200 mg Oral 2 times daily 02/28/21 1106 03/05/21 0959   02/28/21 1030  vancomycin (VANCOCIN) capsule 125 mg  Status:  Discontinued        125 mg Oral 4 times daily 02/28/21 0941 02/28/21 1104   02/27/21 0100  fidaxomicin (DIFICID) tablet 200 mg  Status:  Discontinued        200 mg Oral 2 times daily 02/27/21 0045 02/27/21 0053       Subjective: Today, patient was seen and examined at bedside.  Continues to have a low appetite and had diarrhea 2 episodes over the night.  Denies chest pain, increasing shortness of breath but has dry mouth.  Family reports that he is got lot of weakness at home  Objective: Vitals:   02/28/21 0331 02/28/21 0515  BP: 123/60 127/63  Pulse: 73 68  Resp: 18 18  Temp: 98 F (36.7 C) 97.9 F (36.6 C)  SpO2: 96% 95%    Intake/Output Summary (Last 24 hours) at 02/28/2021 1200 Last data filed at 02/28/2021 0332 Gross per 24 hour  Intake --  Output 150 ml  Net -150 ml   Filed Weights   02/26/21 2042 02/27/21 2138  02/28/21 0106  Weight: 76 kg 77.4 kg 77.4 kg   Body mass index is 25.2 kg/m.   Physical Exam: GENERAL: Patient is alert awake and communicative,. Not in obvious distress. HENT: No scleral pallor or icterus. Pupils equally reactive to light. Oral mucosa is dry NECK: is supple, no gross swelling noted. CHEST: Clear to auscultation. No crackles or wheezes.  Diminished breath sounds bilaterally. CVS: S1 and S2 heard, murmur noted regular rate and rhythm.  ABDOMEN: Soft, non-tender, bowel sounds are present. EXTREMITIES: Bilateral lower extremity pitting edema noted. CNS: Cranial nerves are intact. No focal motor deficits. SKIN: warm and dry without rashes.  Data Review: I have personally reviewed the following laboratory data and studies,  CBC: Recent Labs  Lab 02/26/21 2105 02/28/21 0513  WBC 6.5 3.4*  HGB 10.4* 9.1*  HCT 32.3* 28.6*  MCV 97.0 96.3  PLT 164 119*   Basic Metabolic Panel: Recent Labs  Lab 02/26/21 2105 02/27/21 0702 02/28/21 0513  NA 133* 131* 132*  K 3.3* 3.6 3.8  CL 97* 102 101  CO2 25 24 26   GLUCOSE 119* 94 72  BUN 14 12 13   CREATININE 0.93 0.91 0.66  CALCIUM 8.1* 7.4* 7.7*  MG  --  1.7 1.9  PHOS  --   --  2.2*   Liver Function Tests: Recent Labs  Lab 02/26/21 2105 02/28/21 0513  AST 30 24  ALT 15 11  ALKPHOS 72 53  BILITOT 1.3* 1.0  PROT 6.7 5.4*  ALBUMIN 3.4* 2.5*   Recent Labs  Lab 02/26/21 2105  LIPASE 27   No results for input(s): AMMONIA in the last 168 hours. Cardiac Enzymes: No results for input(s): CKTOTAL, CKMB, CKMBINDEX, TROPONINI in the last 168 hours. BNP (last 3 results) Recent Labs    02/04/21 0750  BNP 660.5*    ProBNP (last 3 results) No results for input(s): PROBNP in the last 8760 hours.  CBG: No results for input(s): GLUCAP in the last 168 hours. Recent Results (from the past 240 hour(s))  Resp Panel by RT-PCR (Flu A&B, Covid) Nasopharyngeal Swab     Status: None   Collection Time: 02/26/21 10:30 PM    Specimen: Nasopharyngeal Swab; Nasopharyngeal(NP) swabs in vial transport medium  Result Value Ref Range Status   SARS Coronavirus 2 by RT PCR NEGATIVE NEGATIVE Final    Comment: (NOTE) SARS-CoV-2 target nucleic acids are NOT DETECTED.  The SARS-CoV-2 RNA is generally detectable in upper respiratory specimens during the acute phase of infection. The lowest concentration of SARS-CoV-2 viral copies this assay can detect is 138 copies/mL. A negative result does not preclude SARS-Cov-2 infection and should not be used as the sole basis for treatment or other patient management decisions. A negative result may occur with  improper specimen collection/handling, submission of specimen other than nasopharyngeal swab, presence of viral mutation(s) within the areas targeted by this assay, and inadequate number of viral copies(<138 copies/mL). A negative result must be combined with clinical observations, patient history, and epidemiological information. The expected result is Negative.  Fact Sheet for Patients:  EntrepreneurPulse.com.au  Fact Sheet for Healthcare Providers:  IncredibleEmployment.be  This test is no t yet approved or cleared by the [place] FDA and  has been authorized for detection and/or diagnosis of SARS-CoV-2 by FDA under an Emergency Use Authorization (EUA). This EUA will remain  in effect (meaning this test can be used) for the duration of the COVID-19 declaration under Section 564(b)(1) of the Act, 21 U.S.C.section 360bbb-3(b)(1), unless the authorization is terminated  or revoked sooner.       Influenza A by PCR NEGATIVE NEGATIVE Final   Influenza B by PCR NEGATIVE NEGATIVE Final    Comment: (NOTE) The Xpert Xpress SARS-CoV-2/FLU/RSV plus assay is intended as an aid in the diagnosis of influenza from Nasopharyngeal swab specimens and should not be used as a sole basis for treatment. Nasal washings and aspirates are  unacceptable for Xpert Xpress SARS-CoV-2/FLU/RSV testing.  Fact Sheet for Patients: EntrepreneurPulse.com.au  Fact Sheet for Healthcare Providers: IncredibleEmployment.be  This test is not yet approved or cleared by the [place] FDA and has been authorized for detection and/or diagnosis of SARS-CoV-2 by FDA under an Emergency Use Authorization (EUA). This EUA will remain in effect (meaning this test can be used) for the duration of the COVID-19 declaration under Section 564(b)(1) of the Act, 21 U.S.C. section 360bbb-3(b)(1), unless the authorization is terminated or revoked.  Performed at [facility], [place] [address], [place], [place] [address]   C Difficile Quick Screen w PCR reflex     Status: None   Collection Time: 02/26/21 11:16 PM   Specimen: STOOL  Result Value Ref Range Status   C Diff antigen NEGATIVE NEGATIVE Final   C Diff toxin NEGATIVE NEGATIVE Final   C Diff interpretation No C. difficile detected.  Final    Comment: Performed at [facility], [place] [address], [place], [place] [address]  C Difficile Quick Screen w PCR reflex     Status: Abnormal   Collection Time: 02/27/21  4:41 PM   Specimen: Stool  Result Value Ref Range Status   C Diff antigen POSITIVE (A) NEGATIVE Final   C Diff toxin NEGATIVE NEGATIVE Final   C Diff interpretation Results are indeterminate. See PCR results.  Final    Comment: Performed at [facility], [place] [address], [place], [place] [address]  C. Diff by PCR, Reflexed     Status: Abnormal   Collection Time: 02/27/21  4:41 PM  Result Value Ref Range Status   Toxigenic C. Difficile by PCR POSITIVE (A) NEGATIVE Final    Comment: Positive for toxigenic C. difficile with little to no toxin production. Only treat if clinical presentation suggests symptomatic illness. Performed at [facility], [place] [address], [place], [place] [address]       Studies: ECHOCARDIOGRAM COMPLETE  Result Date: 02/27/2021    ECHOCARDIOGRAM REPORT   Patient Name:   Anthony Gould Date of Exam: 02/27/2021 Medical Rec #:  [id number]         Height:       69.0 in Accession #:    [id number]        Weight:       167.6 lb Date of Birth:  11-Oct-1933         BSA:          1.917 m Patient Age:    77 years          BP:           124/56 mmHg Patient Gender: M                 HR:           69 bpm. Exam Location:  Inpatient Procedure: 2D Echo, 3D Echo, Cardiac Doppler and Color Doppler Indications:     I50.40* Unspecified combined systolic (congestive) and                  diastolic (congestive) heart failure  History:         Patient has prior history of Echocardiogram examinations, most                  recent 03/09/2019. CHF, Abnormal ECG, Pulmonary HTN and TIA,                  Mitral Valve Disease, Arrythmias:Atrial Fibrillation and NSVT,                  Signs/Symptoms:Chest Pain and Syncope; Risk                  Factors:Hypertension and Diabetes. Mechanical mitral valve.                   Mitral Valve: unknown size Medtronic tilting disc valve valve  is present in the mitral position. Procedure Date: 30.  Sonographer:     Roseanna Rainbow RDCS Referring Phys:  [id number] [place] Diagnosing Phys: Oswaldo Milian MD  Sonographer Comments: Technically difficult study due to poor echo windows. IMPRESSIONS  1. Left ventricular ejection fraction, by estimation, is 40 to 45%. The left ventricle has mildly decreased function. The left ventricle demonstrates regional wall motion abnormalities (see scoring diagram/findings for description). Septal hypokinesis. There is moderate left ventricular hypertrophy. Left ventricular diastolic parameters are indeterminate.  2. Right ventricular systolic function is moderately reduced. The right ventricular size is moderately enlarged. There is moderately elevated pulmonary artery systolic pressure. The estimated right  ventricular systolic pressure is 93.7 mmHg.  3. Left atrial size was severely dilated.  4. Right atrial size was severely dilated.  5. 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 Procedure Date: 22. Trivial mitral valve regurgitation.     MG 23mmHg at 63bpm, EOA 2.4 cm^2  6. The tricuspid valve is abnormal. Tricuspid valve regurgitation is severe.  7. The aortic valve is tricuspid. Aortic valve regurgitation is trivial. Aortic valve sclerosis is present, with no evidence of aortic valve stenosis.  8. The inferior vena cava is dilated in size with <50% respiratory variability, suggesting right atrial pressure of 15 mmHg. FINDINGS  Left Ventricle: Left ventricular ejection fraction, by estimation, is 40 to 45%. The left ventricle has mildly decreased function. The left ventricle demonstrates regional wall motion abnormalities. The left ventricular internal cavity size was normal in size. There is moderate left ventricular hypertrophy. Left ventricular diastolic parameters are indeterminate.  LV Wall Scoring: The entire septum is hypokinetic. The entire anterior wall, entire lateral wall, entire inferior wall, and apex are normal. Right Ventricle: The right ventricular size is moderately enlarged. Right vetricular wall thickness was not well visualized. Right ventricular systolic function is moderately reduced. There is moderately elevated pulmonary artery systolic pressure. The tricuspid regurgitant velocity is 3.09 m/s, and with an assumed right atrial pressure of 15 mmHg, the estimated right ventricular systolic pressure is 16.9 mmHg. Left Atrium: Left atrial size was severely dilated. Right Atrium: Right atrial size was severely dilated. Pericardium: There is no evidence of pericardial effusion. Mitral Valve: The mitral valve has been repaired/replaced. Trivial mitral valve regurgitation. There is a unknown size Medtronic tilting disc valve present in the mitral position. Procedure  Date: 41. MV peak gradient, 13.5 mmHg. The mean mitral valve gradient is 4.0 mmHg. Tricuspid Valve: The tricuspid valve is abnormal. Tricuspid valve regurgitation is severe. Aortic Valve: The aortic valve is tricuspid. Aortic valve regurgitation is trivial. Aortic valve sclerosis is present, with no evidence of aortic valve stenosis. Pulmonic Valve: The pulmonic valve was not well visualized. Pulmonic valve regurgitation is trivial. Aorta: The aortic root and ascending aorta are structurally normal, with no evidence of dilitation. Venous: The inferior vena cava is dilated in size with less than 50% respiratory variability, suggesting right atrial pressure of 15 mmHg. IAS/Shunts: The interatrial septum was not well visualized.  LEFT VENTRICLE PLAX 2D LVIDd:         5.10 cm      Diastology LVIDs:         4.00 cm      LV e' medial:    7.40 cm/s LV PW:         2.00 cm      LV E/e' medial:  22.2 LV IVS:        0.80 cm  LV e' lateral:   7.29 cm/s LVOT diam:     2.40 cm      LV E/e' lateral: 22.5 LV SV:         97 LV SV Index:   51 LVOT Area:     4.52 cm  LV Volumes (MOD) LV vol d, MOD A2C: 135.0 ml LV vol d, MOD A4C: 105.0 ml LV vol s, MOD A2C: 69.9 ml LV vol s, MOD A4C: 59.0 ml LV SV MOD A2C:     65.1 ml LV SV MOD A4C:     105.0 ml LV SV MOD BP:      53.6 ml RIGHT VENTRICLE            IVC RV S prime:     8.33 cm/s  IVC diam: 2.70 cm TAPSE (M-mode): 1.4 cm LEFT ATRIUM            Index        RIGHT ATRIUM           Index LA diam:      5.80 cm  3.03 cm/m   RA Area:     39.60 cm LA Vol (A2C): 72.1 ml  37.62 ml/m  RA Volume:   166.00 ml 86.62 ml/m LA Vol (A4C): 140.0 ml 73.05 ml/m  AORTIC VALVE             PULMONIC VALVE LVOT Vmax:   104.00 cm/s PR End Diast Vel: 2.22 msec LVOT Vmean:  67.300 cm/s LVOT VTI:    0.215 m  AORTA Ao Root diam: 3.60 cm Ao Asc diam:  3.50 cm MITRAL VALVE                TRICUSPID VALVE MV Area (PHT): 3.48 cm     TR Peak grad:   38.2 mmHg MV Area VTI:   2.61 cm     TR Vmax:        309.00  cm/s MV Peak grad:  13.5 mmHg MV Mean grad:  4.0 mmHg     SHUNTS MV Vmax:       1.84 m/s     Systemic VTI:  0.22 m MV Vmean:      88.9 cm/s    Systemic Diam: 2.40 cm MV Decel Time: 218 msec MV E velocity: 164.00 cm/s Oswaldo Milian MD Electronically signed by Oswaldo Milian MD Signature Date/Time: 02/27/2021/6:41:19 PM    Final (Updated)       Flora Lipps, MD  [facility] 02/28/2021  If 7PM-7AM, please contact night-coverage

## 2021-02-28 NOTE — TOC Benefit Eligibility Note (Signed)
Patient Advocate Encounter  Patient is approved through the [employer] Patient Assistance Program for Dificid through 03/25/2022.   Medication will be mailed to patient's home.   Lyndel Safe, [place] Patient Advocate Specialist [place] Patient Advocate Team Direct Number: [phone]  Fax: [phone]

## 2021-02-28 NOTE — Telephone Encounter (Signed)
Spoke with patient's son.  He said the patients wife spoke with the cardiologist today and heart failure was discussed based on the echo yesterday.  He wanted to hear from Dr. Angelena Form since he has a valve replacement and a stent and a long cardiac history.  I was able to discuss the most recent EF in comparison to 02/2019.  EF is 40-45%. He is getting IV diuretics and has recurrent cdiff.  I adv that speaking with the patient's nurse would be helpful since that person is taking care of him and knows more about him currently.  He is going to speak with the nurse for more info and is appreciative of our discussion.  I let him know that I would pass on the information to Dr. Angelena Form to make him aware.  A return call from the doctor is not needed at this time.

## 2021-02-28 NOTE — Progress Notes (Signed)
ANTICOAGULATION CONSULT NOTE -   Consult  Pharmacy Consult for warfarin Indication:  mechanical valve  Allergies  Allergen Reactions   Diltiazem Other (See Comments)    "Bradycardia," per pt; tolerates Amlodipine   Sulfa Antibiotics Rash and Other (See Comments)    Reaction not fully recalled    Sulfonamide Derivatives Rash and Other (See Comments)    Reaction not fully recalled     Patient Measurements: Height: 5\' 9"  (175.3 cm) Weight: 77.4 kg (170 lb 10.2 oz) IBW/kg (Calculated) : 70.7  Vital Signs: Temp: 97.9 F (36.6 C) (12/06 0515) Temp Source: Oral (12/06 0515) BP: 127/63 (12/06 0515) Pulse Rate: 68 (12/06 0515)  Labs: Recent Labs    02/26/21 2105 02/27/21 0020 02/27/21 0426 02/27/21 0702 02/28/21 0513  HGB 10.4*  --   --   --  9.1*  HCT 32.3*  --   --   --  28.6*  PLT 164  --   --   --  141*  LABPROT  --  37.4* 36.7*  --  34.4*  INR  --  3.8* 3.7*  --  3.4*  CREATININE 0.93  --   --  0.91 0.66     Estimated Creatinine Clearance: 67.5 mL/min (by C-G formula based on SCr of 0.66 mg/dL).   Medical History: 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   NSVT (nonsustained ventricular tachycardia)    Peripheral edema    venous insufficiency   Permanent atrial fibrillation (HCC)    Pulmonary hypertension (HCC)    Syncope    TIA (transient ischemic attack)    while on coumadin   Type II or unspecified type diabetes mellitus without mention of complication, not stated as uncontrolled    lifestyle mangement   Ventricular ectopy    symptomatic    Assessment: 87 y.o. male with medical history significant for severe mitral regurgitation s/p MVR in 1986 on warfarin, permanent atrial fibrillation, diastolic CHF, pulmonary hypertension,  hypertension, hyperlipidemia, type 2 diabetes, CKD stage III, TIA who presents with persistent diarrhea.  Warfarin per pharmacy  Home dose warfarin 2.5mg  on Mon and Friday, 5mg  all other days.  LD 12/4 @ 0800  02/28/21 9:55 AM  INR 3.4. Therapeutic  no bleeding reported CBC low but stable    Goal of Therapy:  INR 3-3.5   Plan:  Warfarin 5 mg PO x 1 today  Daily INR  Monitor for signs and symptoms of bleeding   Royetta Asal, PharmD, BCPS 02/28/2021 9:55 AM

## 2021-02-28 NOTE — Telephone Encounter (Signed)
Pts son states that his family and him were delivered bad news in regards to his dads condition currently at the hospital.. pt would like for Dr. Angelena Form to follow up with him so that he is able to get a clear understanding of what is going on... please advise

## 2021-02-28 NOTE — Evaluation (Signed)
Physical Therapy Evaluation Patient Details Name: Anthony Gould MRN: [id number] DOB: 23-Jun-1933 Today's Date: 02/28/2021  History of Present Illness   Patient is 87 y.o. male presented to hospital with episodes of watery diarrhea at home with fever. Of note patient has been in the hospital recently few times initially with acute appendicitis and had appendectomy followed by infected hematoma and abscess and had been on antibiotic.  Patient had developed C. difficile colitis and was initially treated with p.o. vancomycin and Flagyl and was subsequently changed to rifaximin since it interfered with Coumadin. Pt admitted for concerns of possible second episode fo C. diff. PMH significant for severe mitral regurgitation status post MVR on Coumadin, permanent atrial fibrillation, diastolic congestive heart failure, pulmonary hypertension,  CKD stage III, TIA.   Clinical Impression  Anthony Gould is 87 y.o. male admitted with above HPI and diagnosis. Patient is currently limited by functional impairments below (see PT problem list). Patient lives with his wife and is independent with RW for mobility at baseline. Patient has been working with [facility] since last admission and reports improvement in mobility. This date pt able to ambulate ~300' with Min guard and RW. Limited by BM but brief in place. Patient will benefit from continued skilled PT interventions to address impairments and progress independence with mobility, recommending continued HHPT. Acute PT will follow and progress as able.        Recommendations for follow up therapy are one component of a multi-disciplinary discharge planning process, led by the attending physician.  Recommendations may be updated based on patient status, additional functional criteria and insurance authorization.  PT Recommendation   Follow Up Recommendations Home health PT Filed 02/28/2021 1400  Assistance recommended at discharge Set up Supervision/Assistance  Filed 02/28/2021 1400  Functional Status Assessment Patient has had a recent decline in their functional status and demonstrates the ability to make significant improvements in function in a reasonable and predictable amount of time. Filed 02/28/2021 1400  PT equipment None recommended by PT Filed 02/28/2021 1400       02/28/21 1400  PT Visit Information  Last PT Received On 02/28/21  Assistance Needed +1  History of Present Illness Patient is 87 y.o. male presented to hospital with episodes of watery diarrhea at home with fever. Of note patient has been in the hospital recently few times initially with acute appendicitis and had appendectomy followed by infected hematoma and abscess and had been on antibiotic.  Patient had developed C. difficile colitis and was initially treated with p.o. vancomycin and Flagyl and was subsequently changed to rifaximin since it interfered with Coumadin. Pt admitted for concerns of possible second episode fo C. diff. PMH significant for severe mitral regurgitation status post MVR on Coumadin, permanent atrial fibrillation, diastolic congestive heart failure, pulmonary hypertension,  CKD stage III, TIA.  Precautions  Precautions Fall  Precaution Comments incontinent  Restrictions  Weight Bearing Restrictions No  Home Living  Family/patient expects to be discharged to: Private residence  Living Arrangements Spouse/significant other  Available Help at Discharge Family;Available 24 hours/day  Type of Home House  Home Access Level entry  Home Layout Two level;Bed/bath upstairs  Alternate Level Stairs-Number of Steps 14  Alternate Level Stairs-Rails Right;Left  Bathroom Shower/Tub Tub/shower unit  Risk manager (2 wheels);Cane - single point;Shower seat  Additional Comments does not use DME; also has stair lift if needed  Prior Function  Prior Level of Function  Independent/Modified Independent  Mobility Comments  pt  has been using RW at home intermittently and working with [facility]  Pain Assessment  Pain Assessment No/denies pain  Cognition  Arousal/Alertness Awake/alert  Behavior During Therapy WFL for tasks assessed/performed  Overall Cognitive Status Within Functional Limits for tasks assessed  General Comments HOH  Upper Extremity Assessment  Upper Extremity Assessment Overall WFL for tasks assessed  Lower Extremity Assessment  Lower Extremity Assessment Generalized weakness  RLE Sensation WNL  RLE Coordination WNL  LLE Sensation WNL  LLE Coordination WNL  Cervical / Trunk Assessment  Cervical / Trunk Assessment Normal  Bed Mobility  General bed mobility comments pt OOB in recliner  Transfers  Overall transfer level Needs assistance  Equipment used Rolling walker (2 wheels)  Transfers Sit to/from Stand  Sit to Stand Min guard  General transfer comment guarding for safety, pt demonstrates safe hand placement for power up from recliner and toilet.  Ambulation/Gait  Ambulation/Gait assistance Min guard  Gait Distance (Feet) 300 Feet  Assistive device Rolling walker (2 wheels)  Gait Pattern/deviations Step-through pattern;Decreased stride length  General Gait Details cues for safe walker proximity and pt maintained throughout, pt limited by uncontrolled BM, breif in place and did the job right.  Gait velocity decr  Balance  Overall balance assessment Mild deficits observed, not formally tested  PT - End of Session  Equipment Utilized During Treatment Gait belt  Activity Tolerance Patient tolerated treatment well  Patient left in chair;with call bell/phone within reach  Nurse Communication Mobility status  PT Assessment  PT Recommendation/Assessment Patient needs continued PT services  PT Visit Diagnosis Difficulty in walking, not elsewhere classified (R26.2)  PT Problem List Decreased activity tolerance;Decreased mobility  PT Plan  PT Frequency (ACUTE  ONLY) Min 3X/week  PT Treatment/Interventions (ACUTE ONLY) Gait training;Therapeutic exercise;Therapeutic activities  AM-PAC PT "6 Clicks" Mobility Outcome Measure (Version 2)  Help needed turning from your back to your side while in a flat bed without using bedrails? 3  Help needed moving from lying on your back to sitting on the side of a flat bed without using bedrails? 3  Help needed moving to and from a bed to a chair (including a wheelchair)? 3  Help needed standing up from a chair using your arms (e.g., wheelchair or bedside chair)? 3  Help needed to walk in hospital room? 3  Help needed climbing 3-5 steps with a railing?  3  6 Click Score 18  Consider Recommendation of Discharge To: Home with [facility]  Progressive Mobility  What is the highest level of mobility based on the progressive mobility assessment? Level 5 (Walks with assist in room/hall) - Balance while stepping forward/back and can walk in room with assist - Complete  Mobility Ambulated with assistance in room;Ambulated with assistance in hallway  PT Recommendation  Follow Up Recommendations Home health PT  Assistance recommended at discharge Set up Supervision/Assistance  Functional Status Assessment Patient has had a recent decline in their functional status and demonstrates the ability to make significant improvements in function in a reasonable and predictable amount of time.  PT equipment None recommended by PT  Individuals Consulted  Consulted and Agree with Results and Recommendations Patient  Acute Rehab PT Goals  Patient Stated Goal to get strength back  PT Goal Formulation With patient  Time For Goal Achievement 03/14/21  Potential to Achieve Goals Good  PT Time Calculation  PT Start Time (ACUTE ONLY) 1424  PT Stop Time (ACUTE ONLY) 1456  PT  Time Calculation (min) (ACUTE ONLY) 32 min  PT General Charges  $$ ACUTE PT VISIT 1 Visit  PT Evaluation  $PT Eval Low Complexity 1 Low  PT Treatments  $Gait Training 8-22  mins  Written Expression  Dominant Hand Right    Verner Mould, DPT Acute Rehabilitation Services Office [phone] Pager [phone]    Jacques Navy 02/28/2021, 7:13 PM

## 2021-02-28 NOTE — Consult Note (Signed)
CONSULTATION NOTE   Patient Name: Anthony Gould Date of Encounter: 02/28/2021 Cardiologist: Lauree Chandler, MD Electrophysiologist: None Advanced Heart Failure: None   Chief Complaint   Diarrhea, lower extremity edema  Patient Profile   87 year old male with a history of mechanical MVR, permanent atrial fibrillation, pulmonary hypertension and on warfarin anticoagulation, presents with profuse diarrhea, recurrent C. difficile colitis and acute systolic congestive heart failure with reduced LVEF to 40 to 45%  HPI   Anthony Gould is a 87 y.o. male who is being seen today for the evaluation of shortness of breath, edema at the request of Dr. Louanne Belton. This is a pleasant 87 year old male recently seen in the office 4 days ago by Dr. Angelena Form, with a history of mechanical MVR in 1986, permanent atrial fibrillation, pulmonary hypertension, diabetes, hypertension, dyslipidemia and stage III chronic kidney disease.  He is chronically managed on warfarin.  Recently was hospitalized with appendicitis and underwent appendectomy and developed an abscess.  Subsequently after being treated with antibiotics he developed C. difficile colitis and was admitted for several weeks.  Eventually his colitis resolved and he was seen as an outpatient.  He was thought to be volume overloaded and his Lasix was increased from 40 mg daily to 40 mg twice daily due to worsening lower extremity edema.  In fact he had pitting edema up to the thighs and scrotal edema.  An outpatient echo was ordered however after the visit he began developing recurrent significant diarrhea and was admitted.  Repeat testing shows recurrent C. difficile colitis.  An echocardiogram was performed yesterday which showed newly reduced LVEF to 40-45% with septal hypokinesis and moderate LVH.  There is also moderate RV dysfunction with an RVSP of 53 mmHg and severe biatrial enlargement.  The mean gradient across the mitral valve was low at  5 mmHg, no evidence for endocarditis.  PMHx   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   NSVT (nonsustained ventricular tachycardia)    Peripheral edema    venous insufficiency   Permanent atrial fibrillation (HCC)    Pulmonary hypertension (HCC)    Syncope    TIA (transient ischemic attack)    while on coumadin   Type II or unspecified type diabetes mellitus without mention of complication, not stated as uncontrolled    lifestyle mangement   Ventricular ectopy    symptomatic    Past Surgical History:  Procedure Laterality Date   CHOLECYSTECTOMY, LAPAROSCOPIC  2003   IR PATIENT EVAL TECH 0-60 MINS  01/09/2021   IR RADIOLOGIST EVAL & MGMT  01/19/2021   IR RADIOLOGIST EVAL & MGMT  02/02/2021   LAPAROSCOPIC APPENDECTOMY N/A 12/05/2020   Procedure: APPENDECTOMY LAPAROSCOPIC;  Surgeon: Stark Klein, MD;  Location: [facility] OR;  Service: General;  Laterality: N/A;   MITRAL VALVE REPLACEMENT     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    FAMHx   Family History  Problem Relation Age of Onset   Coronary artery disease Father    Diabetes Other    Coronary artery disease Other    Prostate cancer Neg Hx    Colon cancer Neg Hx     SOCHx    reports that he has never smoked. He has never  used smokeless tobacco. He reports that he does not drink alcohol and does not use drugs.  Outpatient Medications   No current facility-administered medications on file prior to encounter.   Current Outpatient Medications on File Prior to Encounter  Medication Sig Dispense Refill   acetaminophen (TYLENOL) 500 MG tablet Take 500 mg by mouth every 6 (six) hours as needed for fever.     allopurinol (ZYLOPRIM) 300 MG tablet TAKE 1 TABLET BY MOUTH DAILY AS NEEDED. GENERIC EQUIVALENT  FOR ZYLOPRIM (Patient taking differently: Take 300 mg by mouth daily.) 90 tablet 3   aspirin 81 MG EC tablet Take 81 mg by mouth daily.     atorvastatin (LIPITOR) 20 MG tablet TAKE 1 TABLET BY MOUTH TWO TIMES A WEEK (Patient taking differently: Take 20 mg by mouth 2 (two) times a week. on Monday and Friday night) 26 tablet 3   Cholecalciferol (VITAMIN D3) 50 MCG (2000 UT) capsule TAKE 1 CAPSULE BY MOUTH EVERY DAY (Patient taking differently: Take 2,000 Units by mouth daily.) 100 capsule 3   dipyridamole (PERSANTINE) 50 MG tablet Take 1 tablet (50 mg total) by mouth 3 (three) times daily. 270 tablet 1   feeding supplement (ENSURE ENLIVE / ENSURE PLUS) LIQD Take 237 mLs by mouth 2 (two) times daily between meals. 5000 mL 0   ferrous [id number] C-folic acid (TRINSICON / FOLTRIN) capsule Take 1 capsule by mouth 2 (two) times daily after a meal. (Patient taking differently: Take 1 capsule by mouth daily.) 60 capsule 1   furosemide (LASIX) 40 MG tablet TAKE 1 TABLET BY MOUTH DAILY GENERIC EQUIVALENT FOR LASIX (Patient taking differently: Take 80 mg by mouth daily.) 90 tablet 3   latanoprost (XALATAN) 0.005 % ophthalmic solution Place 1 drop into both eyes at bedtime.     saccharomyces boulardii (FLORASTOR) 250 MG capsule Take 1 capsule (250 mg total) by mouth 2 (two) times daily. 180 capsule 0   sodium chloride 1 g tablet Take 1 tablet (1 g total) by mouth 2 (two) times daily with a meal for 14 days. 28 tablet 0   warfarin (COUMADIN) 5 MG tablet TAKE 1 TABLET BY MOUTH AS DIRECTED BY THE COUMADIN CLINIC (Patient taking differently: Take 2.5-5 mg by mouth See admin instructions. Take 2.5mg  by mouth daily on Monday and Friday, then take 5mg  all other days or as directed by the Coumadin clinic) 100 tablet 1   bisoprolol (ZEBETA) 5 MG tablet Take 1 tablet (5 mg total) by mouth daily. Please keep upcoming appt for future refills. Thank you (Patient taking differently: Take 5 mg by mouth at bedtime. Please  keep upcoming appt for future refills. Thank you) 45 tablet 0    Inpatient Medications    Scheduled Meds:  allopurinol  300 mg Oral Daily   aspirin EC  81 mg Oral Daily   atorvastatin  20 mg Oral Once per day on Mon Thu   bisoprolol  5 mg Oral Daily   cholecalciferol  2,000 Units Oral Daily   dipyridamole  50 mg Oral TID   ferrous [id number] C-folic acid  1 capsule Oral Daily   fidaxomicin  200 mg Oral BID   Followed by   Derrill Memo ON 03/06/2021] fidaxomicin  200 mg Oral QODAY   furosemide  60 mg Intravenous Daily   latanoprost  1 drop Both Eyes QHS   saccharomyces boulardii  250 mg Oral BID   warfarin  5 mg Oral ONCE-1600   Warfarin - Pharmacist Dosing Inpatient  Does not apply q1600    Continuous Infusions:  potassium PHOSPHATE IVPB (in mmol) 30 mmol (02/28/21 1307)    PRN Meds: acetaminophen   ALLERGIES   Allergies  Allergen Reactions   Diltiazem Other (See Comments)    "Bradycardia," per pt; tolerates Amlodipine   Sulfa Antibiotics Rash and Other (See Comments)    Reaction not fully recalled    Sulfonamide Derivatives Rash and Other (See Comments)    Reaction not fully recalled     ROS   Pertinent items noted in HPI and remainder of comprehensive ROS otherwise negative.  Vitals   Vitals:   02/28/21 0106 02/28/21 0331 02/28/21 0515 02/28/21 1230  BP:  123/60 127/63 125/62  Pulse:  73 68 76  Resp:  18 18 19   Temp:  98 F (36.7 C) 97.9 F (36.6 C) 98.2 F (36.8 C)  TempSrc:  Oral Oral Oral  SpO2:  96% 95% 96%  Weight: 77.4 kg     Height:        Intake/Output Summary (Last 24 hours) at 02/28/2021 1439 Last data filed at 02/28/2021 4403 Gross per 24 hour  Intake --  Output 150 ml  Net -150 ml   Filed Weights   02/26/21 2042 02/27/21 2138 02/28/21 0106  Weight: 76 kg 77.4 kg 77.4 kg    Physical Exam   General appearance: alert, no distress, and pale Neck: JVD - 5 cm above sternal notch, no carotid bruit, and thyroid not enlarged,  symmetric, no tenderness/mass/nodules Lungs: diminished breath sounds bibasilar Heart: irregularly irregular rhythm and systolic murmur: early systolic 3/6, crescendo at 2nd right intercostal space Abdomen: soft, non-tender; bowel sounds normal; no masses,  no organomegaly Extremities: edema 1+ bilateral lower extremity, mild scrotal, mild presacral Pulses: 2+ and symmetric Skin: Pale, warm, dry Neurologic: Mental status: Alert, oriented, thought content appropriate Psych: Pleasant  Labs   Results for orders placed or performed during the hospital encounter of 02/26/21 (from the past 48 hour(s))  Lipase, blood     Status: None   Collection Time: 02/26/21  9:05 PM  Result Value Ref Range   Lipase 27 11 - 51 U/L    Comment: Performed at [facility], [place] [address] [place], [place] [address]  Comprehensive metabolic panel     Status: Abnormal   Collection Time: 02/26/21  9:05 PM  Result Value Ref Range   Sodium 133 (L) 135 - 145 mmol/L   Potassium 3.3 (L) 3.5 - 5.1 mmol/L   Chloride 97 (L) 98 - 111 mmol/L   CO2 25 22 - 32 mmol/L   Glucose, Bld 119 (H) 70 - 99 mg/dL    Comment: Glucose reference range applies only to samples taken after fasting for at least 8 hours.   BUN 14 8 - 23 mg/dL   Creatinine, Ser 0.93 0.61 - 1.24 mg/dL   Calcium 8.1 (L) 8.9 - 10.3 mg/dL   Total Protein 6.7 6.5 - 8.1 g/dL   Albumin 3.4 (L) 3.5 - 5.0 g/dL   AST 30 15 - 41 U/L   ALT 15 0 - 44 U/L   Alkaline Phosphatase 72 38 - 126 U/L   Total Bilirubin 1.3 (H) 0.3 - 1.2 mg/dL   GFR, Estimated >60 >60 mL/min    Comment: (NOTE) Calculated using the CKD-EPI Creatinine Equation (2021)    Anion gap 11 5 - 15    Comment: Performed at [facility], [place] [address], [place], [place] [address]  CBC  Status: Abnormal   Collection Time: 02/26/21  9:05 PM  Result Value Ref Range   WBC 6.5 4.0 - 10.5 K/uL   RBC 3.33 (L) 4.22 - 5.81 MIL/uL   Hemoglobin 10.4 (L) 13.0 - 17.0  g/dL   HCT 32.3 (L) 39.0 - 52.0 %   MCV 97.0 80.0 - 100.0 fL   MCH 31.2 26.0 - 34.0 pg   MCHC 32.2 30.0 - 36.0 g/dL   RDW 17.4 (H) 11.5 - 15.5 %   Platelets 164 150 - 400 K/uL   nRBC 0.0 0.0 - 0.2 %    Comment: Performed at [facility], [place] [address], [place], [place] [address]  Resp Panel by RT-PCR (Flu A&B, Covid) Nasopharyngeal Swab     Status: None   Collection Time: 02/26/21 10:30 PM   Specimen: Nasopharyngeal Swab; Nasopharyngeal(NP) swabs in vial transport medium  Result Value Ref Range   SARS Coronavirus 2 by RT PCR NEGATIVE NEGATIVE    Comment: (NOTE) SARS-CoV-2 target nucleic acids are NOT DETECTED.  The SARS-CoV-2 RNA is generally detectable in upper respiratory specimens during the acute phase of infection. The lowest concentration of SARS-CoV-2 viral copies this assay can detect is 138 copies/mL. A negative result does not preclude SARS-Cov-2 infection and should not be used as the sole basis for treatment or other patient management decisions. A negative result may occur with  improper specimen collection/handling, submission of specimen other than nasopharyngeal swab, presence of viral mutation(s) within the areas targeted by this assay, and inadequate number of viral copies(<138 copies/mL). A negative result must be combined with clinical observations, patient history, and epidemiological information. The expected result is Negative.  Fact Sheet for Patients:  EntrepreneurPulse.com.au  Fact Sheet for Healthcare Providers:  IncredibleEmployment.be  This test is no t yet approved or cleared by the [place] FDA and  has been authorized for detection and/or diagnosis of SARS-CoV-2 by FDA under an Emergency Use Authorization (EUA). This EUA will remain  in effect (meaning this test can be used) for the duration of the COVID-19 declaration under Section 564(b)(1) of the Act, 21 U.S.C.section  360bbb-3(b)(1), unless the authorization is terminated  or revoked sooner.       Influenza A by PCR NEGATIVE NEGATIVE   Influenza B by PCR NEGATIVE NEGATIVE    Comment: (NOTE) The Xpert Xpress SARS-CoV-2/FLU/RSV plus assay is intended as an aid in the diagnosis of influenza from Nasopharyngeal swab specimens and should not be used as a sole basis for treatment. Nasal washings and aspirates are unacceptable for Xpert Xpress SARS-CoV-2/FLU/RSV testing.  Fact Sheet for Patients: EntrepreneurPulse.com.au  Fact Sheet for Healthcare Providers: IncredibleEmployment.be  This test is not yet approved or cleared by the [place] FDA and has been authorized for detection and/or diagnosis of SARS-CoV-2 by FDA under an Emergency Use Authorization (EUA). This EUA will remain in effect (meaning this test can be used) for the duration of the COVID-19 declaration under Section 564(b)(1) of the Act, 21 U.S.C. section 360bbb-3(b)(1), unless the authorization is terminated or revoked.  Performed at [facility], [place] [address] [place], [place] [address]   C Difficile Quick Screen w PCR reflex     Status: None   Collection Time: 02/26/21 11:16 PM   Specimen: STOOL  Result Value Ref Range   C Diff antigen NEGATIVE NEGATIVE   C Diff toxin NEGATIVE NEGATIVE   C Diff interpretation No C. difficile detected.     Comment: Performed at [facility], [place] [address], [place], [place] [address]  Protime-INR     Status: Abnormal   Collection Time: 02/27/21 12:20 AM  Result Value Ref Range   Prothrombin Time 37.4 (H) 11.4 - 15.2 seconds   INR 3.8 (H) 0.8 - 1.2    Comment: (NOTE) INR goal varies based on device and disease states. Performed at [facility], [place] [address] [place], [place] [address]   Protime-INR     Status: Abnormal   Collection Time: 02/27/21  4:26 AM  Result Value Ref Range    Prothrombin Time 36.7 (H) 11.4 - 15.2 seconds   INR 3.7 (H) 0.8 - 1.2    Comment: (NOTE) INR goal varies based on device and disease states. Performed at [facility], [place] [address] [place], [place] [address]   Basic metabolic panel     Status: Abnormal   Collection Time: 02/27/21  7:02 AM  Result Value Ref Range   Sodium 131 (L) 135 - 145 mmol/L   Potassium 3.6 3.5 - 5.1 mmol/L   Chloride 102 98 - 111 mmol/L   CO2 24 22 - 32 mmol/L   Glucose, Bld 94 70 - 99 mg/dL    Comment: Glucose reference range applies only to samples taken after fasting for at least 8 hours.   BUN 12 8 - 23 mg/dL   Creatinine, Ser 0.91 0.61 - 1.24 mg/dL   Calcium 7.4 (L) 8.9 - 10.3 mg/dL   GFR, Estimated >60 >60 mL/min    Comment: (NOTE) Calculated using the CKD-EPI Creatinine Equation (2021)    Anion gap 5 5 - 15    Comment: Performed at [facility], [place] [address], [place], [place] [address]  Magnesium     Status: None   Collection Time: 02/27/21  7:02 AM  Result Value Ref Range   Magnesium 1.7 1.7 - 2.4 mg/dL    Comment: Performed at [facility], [place] [address] [place], [place] [address]  Urinalysis, Routine w reflex microscopic     Status: Abnormal   Collection Time: 02/27/21  7:44 AM  Result Value Ref Range   Color, Urine YELLOW YELLOW   APPearance HAZY (A) CLEAR   Specific Gravity, Urine 1.016 1.005 - 1.030   pH 5.0 5.0 - 8.0   Glucose, UA NEGATIVE NEGATIVE mg/dL   Hgb urine dipstick NEGATIVE NEGATIVE   Bilirubin Urine NEGATIVE NEGATIVE   Ketones, ur 5 (A) NEGATIVE mg/dL   Protein, ur 100 (A) NEGATIVE mg/dL   Nitrite NEGATIVE NEGATIVE   Leukocytes,Ua NEGATIVE NEGATIVE   RBC / HPF 0-5 0 - 5 RBC/hpf   WBC, UA 0-5 0 - 5 WBC/hpf   Bacteria, UA RARE (A) NONE SEEN   Squamous Epithelial / LPF 0-5 0 - 5   Mucus PRESENT    Hyaline Casts, UA PRESENT     Comment: Performed at [facility], [place] [address],  [place], [place] [address]  C Difficile Quick Screen w PCR reflex     Status: Abnormal   Collection Time: 02/27/21  4:41 PM   Specimen: Stool  Result Value Ref Range   C Diff antigen POSITIVE (A) NEGATIVE   C Diff toxin NEGATIVE NEGATIVE   C Diff interpretation Results are indeterminate. See PCR results.     Comment: Performed at [facility], [place] [address] [place], [place] [address]  C. Diff by PCR, Reflexed     Status: Abnormal   Collection  Time: 02/27/21  4:41 PM  Result Value Ref Range   Toxigenic C. Difficile by PCR POSITIVE (A) NEGATIVE    Comment: Positive for toxigenic C. difficile with little to no toxin production. Only treat if clinical presentation suggests symptomatic illness. Performed at [facility], [place] [address] [place], [place] [address]   Protime-INR     Status: Abnormal   Collection Time: 02/28/21  5:13 AM  Result Value Ref Range   Prothrombin Time 34.4 (H) 11.4 - 15.2 seconds   INR 3.4 (H) 0.8 - 1.2    Comment: (NOTE) INR goal varies based on device and disease states. Performed at [facility], [place] [address], [place], [place] [address]   CBC     Status: Abnormal   Collection Time: 02/28/21  5:13 AM  Result Value Ref Range   WBC 3.4 (L) 4.0 - 10.5 K/uL   RBC 2.97 (L) 4.22 - 5.81 MIL/uL   Hemoglobin 9.1 (L) 13.0 - 17.0 g/dL   HCT 28.6 (L) 39.0 - 52.0 %   MCV 96.3 80.0 - 100.0 fL   MCH 30.6 26.0 - 34.0 pg   MCHC 31.8 30.0 - 36.0 g/dL   RDW 17.3 (H) 11.5 - 15.5 %   Platelets 141 (L) 150 - 400 K/uL   nRBC 0.0 0.0 - 0.2 %    Comment: Performed at [facility], [place] [address], [place], [place] [address]  Magnesium     Status: None   Collection Time: 02/28/21  5:13 AM  Result Value Ref Range   Magnesium 1.9 1.7 - 2.4 mg/dL    Comment: Performed at [facility], [place] [address], [place], [place] [address]  Phosphorus     Status: Abnormal   Collection Time: 02/28/21  5:13 AM   Result Value Ref Range   Phosphorus 2.2 (L) 2.5 - 4.6 mg/dL    Comment: Performed at [facility], [place] [address], [place], [place] [address]  Comprehensive metabolic panel     Status: Abnormal   Collection Time: 02/28/21  5:13 AM  Result Value Ref Range   Sodium 132 (L) 135 - 145 mmol/L   Potassium 3.8 3.5 - 5.1 mmol/L   Chloride 101 98 - 111 mmol/L   CO2 26 22 - 32 mmol/L   Glucose, Bld 72 70 - 99 mg/dL    Comment: Glucose reference range applies only to samples taken after fasting for at least 8 hours.   BUN 13 8 - 23 mg/dL   Creatinine, Ser 0.66 0.61 - 1.24 mg/dL   Calcium 7.7 (L) 8.9 - 10.3 mg/dL   Total Protein 5.4 (L) 6.5 - 8.1 g/dL   Albumin 2.5 (L) 3.5 - 5.0 g/dL   AST 24 15 - 41 U/L   ALT 11 0 - 44 U/L   Alkaline Phosphatase 53 38 - 126 U/L   Total Bilirubin 1.0 0.3 - 1.2 mg/dL   GFR, Estimated >60 >60 mL/min    Comment: (NOTE) Calculated using the CKD-EPI Creatinine Equation (2021)    Anion gap 5 5 - 15    Comment: Performed at [facility], [place] [address] [place], [place] [address]    ECG   ER EKG shows A. fib at 91 (02/26/2021)- Personally Reviewed  Telemetry   A. fib with controlled ventricular response- Personally Reviewed  Radiology   ECHOCARDIOGRAM COMPLETE  Result Date: 02/27/2021    ECHOCARDIOGRAM REPORT   Patient Name:   JAKOBEE  R Asmar Date of Exam: 02/27/2021 Medical Rec #:  [id number]         Height:       69.0 in Accession #:    [id number]        Weight:       167.6 lb Date of Birth:  [birth year]         BSA:          1.917 m Patient Age:    76 years          BP:           124/56 mmHg Patient Gender: M                 HR:           69 bpm. Exam Location:  Inpatient Procedure: 2D Echo, 3D Echo, Cardiac Doppler and Color Doppler Indications:     I50.40* Unspecified combined systolic (congestive) and                  diastolic (congestive) heart failure  History:         Patient has prior history of Echocardiogram  examinations, most                  recent 03/09/2019. CHF, Abnormal ECG, Pulmonary HTN and TIA,                  Mitral Valve Disease, Arrythmias:Atrial Fibrillation and NSVT,                  Signs/Symptoms:Chest Pain and Syncope; Risk                  Factors:Hypertension and Diabetes. Mechanical mitral valve.                   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 Procedure Date: 32.  Sonographer:     Roseanna Rainbow RDCS Referring Phys:  [id number] [place] Diagnosing Phys: Oswaldo Milian MD  Sonographer Comments: Technically difficult study due to poor echo windows. IMPRESSIONS  1. Left ventricular ejection fraction, by estimation, is 40 to 45%. The left ventricle has mildly decreased function. The left ventricle demonstrates regional wall motion abnormalities (see scoring diagram/findings for description). Septal hypokinesis. There is moderate left ventricular hypertrophy. Left ventricular diastolic parameters are indeterminate.  2. Right ventricular systolic function is moderately reduced. The right ventricular size is moderately enlarged. There is moderately elevated pulmonary artery systolic pressure. The estimated right ventricular systolic pressure is 25.0 mmHg.  3. Left atrial size was severely dilated.  4. Right atrial size was severely dilated.  5. 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 Procedure Date: 30. Trivial mitral valve regurgitation.     MG 64mmHg at 63bpm, EOA 2.4 cm^2  6. The tricuspid valve is abnormal. Tricuspid valve regurgitation is severe.  7. The aortic valve is tricuspid. Aortic valve regurgitation is trivial. Aortic valve sclerosis is present, with no evidence of aortic valve stenosis.  8. The inferior vena cava is dilated in size with <50% respiratory variability, suggesting right atrial pressure of 15 mmHg. FINDINGS  Left Ventricle: Left ventricular ejection fraction,  by estimation, is 40 to 45%. The left ventricle has mildly decreased function. The left ventricle demonstrates regional wall motion abnormalities. The left ventricular internal cavity  size was normal in size. There is moderate left ventricular hypertrophy. Left ventricular diastolic parameters are indeterminate.  LV Wall Scoring: The entire septum is hypokinetic. The entire anterior wall, entire lateral wall, entire inferior wall, and apex are normal. Right Ventricle: The right ventricular size is moderately enlarged. Right vetricular wall thickness was not well visualized. Right ventricular systolic function is moderately reduced. There is moderately elevated pulmonary artery systolic pressure. The tricuspid regurgitant velocity is 3.09 m/s, and with an assumed right atrial pressure of 15 mmHg, the estimated right ventricular systolic pressure is 13.2 mmHg. Left Atrium: Left atrial size was severely dilated. Right Atrium: Right atrial size was severely dilated. Pericardium: There is no evidence of pericardial effusion. Mitral Valve: The mitral valve has been repaired/replaced. Trivial mitral valve regurgitation. There is a unknown size Medtronic tilting disc valve present in the mitral position. Procedure Date: 70. MV peak gradient, 13.5 mmHg. The mean mitral valve gradient is 4.0 mmHg. Tricuspid Valve: The tricuspid valve is abnormal. Tricuspid valve regurgitation is severe. Aortic Valve: The aortic valve is tricuspid. Aortic valve regurgitation is trivial. Aortic valve sclerosis is present, with no evidence of aortic valve stenosis. Pulmonic Valve: The pulmonic valve was not well visualized. Pulmonic valve regurgitation is trivial. Aorta: The aortic root and ascending aorta are structurally normal, with no evidence of dilitation. Venous: The inferior vena cava is dilated in size with less than 50% respiratory variability, suggesting right atrial pressure of 15 mmHg. IAS/Shunts: The interatrial septum was not  well visualized.  LEFT VENTRICLE PLAX 2D LVIDd:         5.10 cm      Diastology LVIDs:         4.00 cm      LV e' medial:    7.40 cm/s LV PW:         2.00 cm      LV E/e' medial:  22.2 LV IVS:        0.80 cm      LV e' lateral:   7.29 cm/s LVOT diam:     2.40 cm      LV E/e' lateral: 22.5 LV SV:         97 LV SV Index:   51 LVOT Area:     4.52 cm  LV Volumes (MOD) LV vol d, MOD A2C: 135.0 ml LV vol d, MOD A4C: 105.0 ml LV vol s, MOD A2C: 69.9 ml LV vol s, MOD A4C: 59.0 ml LV SV MOD A2C:     65.1 ml LV SV MOD A4C:     105.0 ml LV SV MOD BP:      53.6 ml RIGHT VENTRICLE            IVC RV S prime:     8.33 cm/s  IVC diam: 2.70 cm TAPSE (M-mode): 1.4 cm LEFT ATRIUM            Index        RIGHT ATRIUM           Index LA diam:      5.80 cm  3.03 cm/m   RA Area:     39.60 cm LA Vol (A2C): 72.1 ml  37.62 ml/m  RA Volume:   166.00 ml 86.62 ml/m LA Vol (A4C): 140.0 ml 73.05 ml/m  AORTIC VALVE             PULMONIC VALVE LVOT Vmax:   104.00 cm/s PR End Diast Vel: 2.22 msec LVOT Vmean:  67.300 cm/s LVOT VTI:    0.215 m  AORTA Ao Root diam: 3.60 cm Ao Asc diam:  3.50 cm MITRAL VALVE                TRICUSPID VALVE MV Area (PHT): 3.48 cm     TR Peak grad:   38.2 mmHg MV Area VTI:   2.61 cm     TR Vmax:        309.00 cm/s MV Peak grad:  13.5 mmHg MV Mean grad:  4.0 mmHg     SHUNTS MV Vmax:       1.84 m/s     Systemic VTI:  0.22 m MV Vmean:      88.9 cm/s    Systemic Diam: 2.40 cm MV Decel Time: 218 msec MV E velocity: 164.00 cm/s Oswaldo Milian MD Electronically signed by Oswaldo Milian MD Signature Date/Time: 02/27/2021/6:41:19 PM    Final (Updated)     Cardiac Studies   See echo above  Impression   Principal Problem:   Recurrent colitis due to Clostridium difficile Active Problems:   DM2 (diabetes mellitus, type 2) ([place])   Essential hypertension   H/O mitral valve replacement with mechanical valve   Hypokalemia   Persistent atrial fibrillation (HCC)   Diarrhea   Biventricular heart failure  (HCC)   Recommendation   Acute biventricular heart failure Mr. Golson has acute biventricular heart failure with predominant right heart failure symptoms including presacral and thigh edema as well as scrotal edema on admission.  He has been diuresed with IV Lasix and is measured to be at least 1 L negative.  His edema is improving.  Creatinine has also improved.  Sodium remains low at 132.  Continue diuresis.  Continue bisoprolol and recommend adding Entresto 24/26 mg BID - monitor BP to see if tolerated.  He will ultimately likely need coronary evaluation and right and left heart catheterization, probably after his colitis has resolved. Longstanding persistent/permanent atrial fibrillation This is reasonably rate controlled at the moment on bisoprolol.  We will continue warfarin with target INR between 2.5 and 3.5. History of mechanical MVR Continue warfarin with target INR between 2.5 and 3.5. C. difficile diarrhea Per hospital medicine.  Agree with Dificid and probiotics.  Thanks for the consultation.  Cardiology will follow with you.  Time Spent Directly with Patient:  I have spent a total of 45 minutes with the patient reviewing hospital notes, telemetry, EKGs, labs and examining the patient as well as establishing an assessment and plan that was discussed personally with the patient.  > 50% of time was spent in direct patient care.  Length of Stay:  LOS: 0 days   Pixie Casino, MD, [facility], [place] Director of the Advanced Lipid Disorders &  Cardiovascular Risk Reduction Clinic Diplomate of the American Board of Clinical Lipidology Attending Cardiologist  Direct Dial: [phone]  Fax: [phone]  Website:  [email or web address] 02/28/2021, 2:39 PM

## 2021-03-01 DIAGNOSIS — I5082 Biventricular heart failure: Secondary | ICD-10-CM

## 2021-03-01 DIAGNOSIS — A0471 Enterocolitis due to Clostridium difficile, recurrent: Secondary | ICD-10-CM | POA: Diagnosis not present

## 2021-03-01 LAB — BASIC METABOLIC PANEL
Anion gap: 7 (ref 5–15)
BUN: 13 mg/dL (ref 8–23)
CO2: 27 mmol/L (ref 22–32)
Calcium: 7.7 mg/dL — ABNORMAL LOW (ref 8.9–10.3)
Chloride: 98 mmol/L (ref 98–111)
Creatinine, Ser: 0.9 mg/dL (ref 0.61–1.24)
GFR, Estimated: >60 mL/min (ref >60)
Glucose, Bld: 105 mg/dL — ABNORMAL HIGH (ref 70–99)
Potassium: 3.6 mmol/L (ref 3.5–5.1)
Sodium: 132 mmol/L — ABNORMAL LOW (ref 135–145)

## 2021-03-01 LAB — CBC
HCT: 28.5 % — ABNORMAL LOW (ref 39.0–52.0)
Hemoglobin: 9 g/dL — ABNORMAL LOW (ref 13.0–17.0)
MCH: 30.1 pg (ref 26.0–34.0)
MCHC: 31.6 g/dL (ref 30.0–36.0)
MCV: 95.3 fL (ref 80.0–100.0)
Platelets: 155 10*3/uL (ref 150–400)
RBC: 2.99 MIL/uL — ABNORMAL LOW (ref 4.22–5.81)
RDW: 16.9 % — ABNORMAL HIGH (ref 11.5–15.5)
WBC: 3.5 10*3/uL — ABNORMAL LOW (ref 4.0–10.5)
nRBC: 0 % (ref 0.0–0.2)

## 2021-03-01 LAB — PHOSPHORUS: Phosphorus: 3.1 mg/dL (ref 2.5–4.6)

## 2021-03-01 LAB — MAGNESIUM: Magnesium: 1.7 mg/dL (ref 1.7–2.4)

## 2021-03-01 LAB — PROTIME-INR
INR: 3.8 — ABNORMAL HIGH (ref 0.8–1.2)
Prothrombin Time: 37.4 seconds — ABNORMAL HIGH (ref 11.4–15.2)

## 2021-03-01 MED ORDER — FUROSEMIDE 40 MG PO TABS
40.0000 mg | ORAL_TABLET | Freq: Every day | ORAL | Status: DC
  Administered 2021-03-01 – 2021-03-03 (×3): 40 mg via ORAL
  Filled 2021-03-01 (×3): qty 1

## 2021-03-01 NOTE — Assessment & Plan Note (Signed)
-   Last A1c 4.9% on 02/04/2021 - Continue diet control

## 2021-03-01 NOTE — TOC Initial Note (Signed)
Transition of Care [facility]) - Initial/Assessment Note    Patient Details  Name: Anthony Gould MRN: [id number] Date of Birth: Jan 03, 1934  Transition of Care [facility]) CM/SW Contact:    Trish Mage, LCSW Phone Number: 03/01/2021, 12:06 PM  Clinical Narrative:   Patient from home with wife is already set up with [facility] services through [facility], as confirmed by [name] with same.  He is receiving HH RN, PT OT and they will plan to continue with same. Will need orders at d/c.  Has all needed DME. TOC will continue to follow during the course of hospitalization.                 Expected Discharge Plan: [place] Barriers to Discharge: No Barriers Identified   Patient Goals and CMS Choice     Choice offered to / list presented to : Patient  Expected Discharge Plan and Services Expected Discharge Plan: [place]   Discharge Planning Services: CM Consult Post Acute Care Choice: [place] arrangements for the past 2 months: Single Family Home Expected Discharge Date:  (unknown)                           HH Agency: [facility] Date Dreana Britz [facility] Agency Contacted: 03/01/21 Time [place]: 1206 Representative spoke with at [place]: [name]  Prior Living Arrangements/Services Living arrangements for the past 2 months: [place] with:: Spouse Patient language and need for interpreter reviewed:: Yes        Need for Family Participation in Patient Care: Yes (Comment) Care giver support system in place?: Yes (comment) Current home services: DME Criminal Activity/Legal Involvement Pertinent to Current Situation/Hospitalization: No - Comment as needed  Activities of Daily Living Home Assistive Devices/Equipment: Cane (specify quad or straight), Walker (specify type), Other (Comment), Shower chair with back (front wheeled walker, single point cane, stair lift, tub/shower unit, standard height toilet) ADL Screening  (condition at time of admission) Patient's cognitive ability adequate to safely complete daily activities?: Yes Is the patient deaf or have difficulty hearing?: No Does the patient have difficulty seeing, even when wearing glasses/contacts?: No Does the patient have difficulty concentrating, remembering, or making decisions?: No Patient able to express need for assistance with ADLs?: Yes Does the patient have difficulty dressing or bathing?: Yes (secondary to weakness) Independently performs ADLs?: No (secondary to weakness) Communication: Independent Dressing (OT): Needs assistance Is this a change from baseline?: Change from baseline, expected to last >3 days Grooming: Independent Feeding: Independent Bathing: Needs assistance Is this a change from baseline?: Change from baseline, expected to last >3 days Toileting: Needs assistance Is this a change from baseline?: Change from baseline, expected to last >3days In/Out Bed: Independent Walks in Home: Needs assistance Is this a change from baseline?: Change from baseline, expected to last >3 days Does the patient have difficulty walking or climbing stairs?: Yes (secondary to weakness) Weakness of Legs: Both Weakness of Arms/Hands: None  Permission Sought/Granted                  Emotional Assessment       Orientation: : Oriented to Self, Oriented to Place, Oriented to  Time, Oriented to Situation Alcohol / Substance Use: Not Applicable Psych Involvement: No (comment)  Admission diagnosis:  Hypokalemia [E87.6] Diarrhea [R19.7] Patient Active Problem List   Diagnosis Date Noted   Diarrhea 02/28/2021   Biventricular heart  failure ([place]) 02/28/2021   Recurrent colitis due to Clostridium difficile 02/28/2021   Acute on chronic diastolic CHF (congestive heart failure) ([place]) 02/27/2021   Persistent atrial fibrillation ([place]) 02/27/2021   Sepsis ([place]) 02/04/2021   Appendiceal abscess 01/10/2021   Hyponatremia 12/30/2020    Hypokalemia 12/30/2020   Iron deficiency anemia 12/30/2020   Postoperative infected hematoma due to E. coli 12/29/2020   Supratherapeutic INR 12/29/2020   Anemia 12/28/2020   H/O mitral valve replacement with mechanical valve    Acute appendicitis 12/03/2020   Insomnia 09/27/2020   Grief at loss of child 09/01/2020   Right hip pain 08/10/2020   Elevated TSH 07/07/2020   CRI (chronic renal insufficiency), stage 3 (moderate) ([place]) 04/08/2020   Neoplasm of uncertain behavior of skin 10/01/2019   Hypomagnesemia 04/02/2019   Syncope 03/05/2019   Abdominal pain 01/20/2019   Vitamin D deficiency 11/28/2018   Low back pain 11/26/2018   Skin cancer 11/26/2018   Melanoma in situ ([place]) 08/26/2018   Foot lesion 04/23/2018   Hematuria 12/18/2017   Night sweats 12/18/2017   Chronic diastolic heart failure ([place]) 09/16/2016   Food allergy 02/13/2016   Cirrhosis of liver without ascites ([place]) 10/12/2015   Nausea with vomiting 10/12/2015   Emesis 09/20/2015   Aspiration pneumonia ([place]) 09/03/2015   Food poisoning 09/02/2015   Hematemesis 09/02/2015   Cough 09/02/2015   Flank pain, acute 05/02/2015   Actinic keratoses 10/20/2014   NSVT (nonsustained ventricular tachycardia) ([place]) 02/16/2013   Routine health maintenance 02/06/2012   Paroxysmal atrial fibrillation ([place]) 09/06/2011   Long term (current) use of anticoagulants 07/04/2010   Gout 06/27/2010   Mitral valve disorder 06/06/2010   Chest pain 02/28/2010   TOXIC LABYRINTHITIS 08/04/2009   Hearing loss 07/07/2009   DYSPEPSIA, CHRONIC 06/23/2007   DM2 (diabetes mellitus, type 2) ([place]) 02/08/2007   Hyperlipidemia 01/31/2007   Essential hypertension 01/31/2007   Coronary atherosclerosis 01/31/2007   PAF (paroxysmal atrial fibrillation) ([place]) 01/31/2007   Cerebral artery occlusion with cerebral infarction ([place]) 01/31/2007   Transient cerebral ischemia 01/31/2007   MITRAL VALVE REPLACEMENT, HX OF 01/31/2007   PCP:  Cassandria Anger, MD Pharmacy:   [facility] [place], [place] [address] AT [place] [address] [place] [address] Phone: [phone] Fax: [phone]  [facility]MAIL ORDER) [place], [place] [address] Phone: [phone] Fax: [phone]  [id number] - [address] [place] AT [facility] [place] [address] Phone: [phone] Fax: [phone]  [facility] ([place]) [place], [place] [address] Phone: [phone] Fax: [phone]  [facility] [address] [address] Phone: [phone] Fax: [place] [address] Phone: [phone] Fax: [phone]     Social Determinants of Health (SDOH) Interventions    Readmission Risk Interventions Readmission Risk Prevention Plan 02/07/2021  Transportation Screening Complete  Medication Review ([place]) Complete  PCP or Specialist appointment within 3-5 days of discharge Complete  HRI or [place] Complete  SW Recovery Care/Counseling Consult Complete  [place] Not Applicable  Some recent data might be hidden

## 2021-03-01 NOTE — Assessment & Plan Note (Signed)
-   On chronic Coumadin for history of mechanical mitral valve - Follows with cardiology - Continue bisoprolol

## 2021-03-01 NOTE — Progress Notes (Signed)
Received call from Telemetry. Patient had an 8 run episode of ventricular tachycardia. Nurse entered room, patient denies any chest pain, dizziness, or light headedness. Dr. Sabino Gasser notified, per MD no new orders at this time. Will continue to monitor patient.

## 2021-03-01 NOTE — Hospital Course (Addendum)
Mr. Maniaci is an 87 yo male with PMH severe MR s/p MVR on Coumadin, permanent afib, dCHF, pulm HTN, CKDIII, TIA who presented with multiple episodes of watery diarrhea at home and fever. He has had some recent exposure to healthcare (appendectomy followed by infected hematoma/abscess treated with antibiotics).  He then developed C. difficile colitis and was treated with oral vancomycin and Flagyl which was then transitioned to fidaxomicin due to interactions with Coumadin. After further discussion with patient and wife, there may have been some confusion at discharge and his fidaxomicin course might not have been completed. Thus, he again returned with recurrent diarrhea and was restarted on fidaxomicin. Repeat Cdiff testing was again positive on admission.  See below for further problem based plan.

## 2021-03-01 NOTE — Assessment & Plan Note (Signed)
-   INR goal 2.5-3.5 - Patient endorses valve replacement approximately 37 years ago - Continue Coumadin

## 2021-03-01 NOTE — Progress Notes (Signed)
DAILY PROGRESS NOTE   Patient Name: Anthony Gould Date of Encounter: 03/01/2021 Cardiologist: Lauree Chandler, MD  Chief Complaint   No complaints  Patient Profile   87 year old male with a history of mechanical MVR, permanent atrial fibrillation, pulmonary hypertension and on warfarin anticoagulation, presents with profuse diarrhea, recurrent C. difficile colitis and acute systolic congestive heart failure with reduced LVEF to 40 to 45%  Subjective   I's and O's suggest net positive - weight is up as well. Creatinine is up and down, but around 0.9. Sodium remains low at 132.  Objective   Vitals:   02/28/21 2008 03/01/21 0152 03/01/21 0326 03/01/21 0859  BP: 127/67  (!) 105/50 128/61  Pulse: 81  77 81  Resp: 20  18 20   Temp: 98.5 F (36.9 C)  98.9 F (37.2 C) 98 F (36.7 C)  TempSrc: Oral  Oral Oral  SpO2:   93% 95%  Weight: 78.9 kg 78.9 kg    Height:        Intake/Output Summary (Last 24 hours) at 03/01/2021 1043 Last data filed at 03/01/2021 4970 Gross per 24 hour  Intake 511.55 ml  Output 150 ml  Net 361.55 ml   Filed Weights   02/28/21 0106 02/28/21 2008 03/01/21 0152  Weight: 77.4 kg 78.9 kg 78.9 kg    Physical Exam   General appearance: alert and no distress Neck: no carotid bruit, no JVD, and thyroid not enlarged, symmetric, no tenderness/mass/nodules Lungs: clear to auscultation bilaterally Heart: regular rate and rhythm, S1, S2 normal, no murmur, click, rub or gallop Abdomen: soft, non-tender; bowel sounds normal; no masses,  no organomegaly Extremities: extremities normal, atraumatic, no cyanosis or edema Pulses: 2+ and symmetric Skin: Skin color, texture, turgor normal. No rashes or lesions Neurologic: Grossly normal Psych: Pleasant  Inpatient Medications    Scheduled Meds:  allopurinol  300 mg Oral Daily   aspirin EC  81 mg Oral Daily   atorvastatin  20 mg Oral Once per day on Mon Thu   bisoprolol  5 mg Oral Daily    cholecalciferol  2,000 Units Oral Daily   dipyridamole  50 mg Oral TID   ferrous [id number] C-folic acid  1 capsule Oral Daily   fidaxomicin  200 mg Oral BID   Followed by   Derrill Memo ON 03/06/2021] fidaxomicin  200 mg Oral QODAY   furosemide  60 mg Intravenous Daily   latanoprost  1 drop Both Eyes QHS   saccharomyces boulardii  250 mg Oral BID   sacubitril-valsartan  1 tablet Oral BID   Warfarin - Pharmacist Dosing Inpatient   Does not apply q1600    Continuous Infusions:   PRN Meds: acetaminophen   Labs   Results for orders placed or performed during the hospital encounter of 02/26/21 (from the past 48 hour(s))  Gastrointestinal Panel by PCR , Stool     Status: None   Collection Time: 02/27/21  4:41 PM   Specimen: Stool  Result Value Ref Range   Campylobacter species NOT DETECTED NOT DETECTED   Plesimonas shigelloides NOT DETECTED NOT DETECTED   Salmonella species NOT DETECTED NOT DETECTED   Yersinia enterocolitica NOT DETECTED NOT DETECTED   Vibrio species NOT DETECTED NOT DETECTED   Vibrio cholerae NOT DETECTED NOT DETECTED   Enteroaggregative E coli (EAEC) NOT DETECTED NOT DETECTED   Enteropathogenic E coli (EPEC) NOT DETECTED NOT DETECTED   Enterotoxigenic E coli (ETEC) NOT DETECTED NOT DETECTED   Shiga like toxin producing E coli (  STEC) NOT DETECTED NOT DETECTED   Shigella/Enteroinvasive E coli (EIEC) NOT DETECTED NOT DETECTED   Cryptosporidium NOT DETECTED NOT DETECTED   Cyclospora cayetanensis NOT DETECTED NOT DETECTED   Entamoeba histolytica NOT DETECTED NOT DETECTED   Giardia lamblia NOT DETECTED NOT DETECTED   Adenovirus F40/41 NOT DETECTED NOT DETECTED   Astrovirus NOT DETECTED NOT DETECTED   Norovirus GI/GII NOT DETECTED NOT DETECTED   Rotavirus A NOT DETECTED NOT DETECTED   Sapovirus (I, II, IV, and V) NOT DETECTED NOT DETECTED    Comment: Performed at [facility], [address] [place], [place] [address]  C Difficile Quick Screen w  PCR reflex     Status: Abnormal   Collection Time: 02/27/21  4:41 PM   Specimen: Stool  Result Value Ref Range   C Diff antigen POSITIVE (A) NEGATIVE   C Diff toxin NEGATIVE NEGATIVE   C Diff interpretation Results are indeterminate. See PCR results.     Comment: Performed at [facility], [place] [address] [place], [place] [address]  C. Diff by PCR, Reflexed     Status: Abnormal   Collection Time: 02/27/21  4:41 PM  Result Value Ref Range   Toxigenic C. Difficile by PCR POSITIVE (A) NEGATIVE    Comment: Positive for toxigenic C. difficile with little to no toxin production. Only treat if clinical presentation suggests symptomatic illness. Performed at [facility], [place] [address] [place], [place] [address]   Protime-INR     Status: Abnormal   Collection Time: 02/28/21  5:13 AM  Result Value Ref Range   Prothrombin Time 34.4 (H) 11.4 - 15.2 seconds   INR 3.4 (H) 0.8 - 1.2    Comment: (NOTE) INR goal varies based on device and disease states. Performed at [facility], [place] [address] [place], [place] [address]   CBC     Status: Abnormal   Collection Time: 02/28/21  5:13 AM  Result Value Ref Range   WBC 3.4 (L) 4.0 - 10.5 K/uL   RBC 2.97 (L) 4.22 - 5.81 MIL/uL   Hemoglobin 9.1 (L) 13.0 - 17.0 g/dL   HCT 28.6 (L) 39.0 - 52.0 %   MCV 96.3 80.0 - 100.0 fL   MCH 30.6 26.0 - 34.0 pg   MCHC 31.8 30.0 - 36.0 g/dL   RDW 17.3 (H) 11.5 - 15.5 %   Platelets 141 (L) 150 - 400 K/uL   nRBC 0.0 0.0 - 0.2 %    Comment: Performed at [facility], [place] [address] [place], [place] [address]  Magnesium     Status: None   Collection Time: 02/28/21  5:13 AM  Result Value Ref Range   Magnesium 1.9 1.7 - 2.4 mg/dL    Comment: Performed at [facility], [place] [address], [place], [place] [address]  Phosphorus     Status: Abnormal   Collection Time: 02/28/21  5:13 AM  Result Value Ref Range   Phosphorus 2.2 (L) 2.5 -  4.6 mg/dL    Comment: Performed at [facility], [place] [address] [place], [place] [address]  Comprehensive metabolic panel     Status: Abnormal   Collection Time: 02/28/21  5:13 AM  Result Value Ref Range   Sodium 132 (L) 135 - 145 mmol/L   Potassium 3.8 3.5 - 5.1 mmol/L   Chloride 101 98 - 111 mmol/L   CO2 26 22 - 32 mmol/L   Glucose, Bld 72 70 -  99 mg/dL    Comment: Glucose reference range applies only to samples taken after fasting for at least 8 hours.   BUN 13 8 - 23 mg/dL   Creatinine, Ser 0.66 0.61 - 1.24 mg/dL   Calcium 7.7 (L) 8.9 - 10.3 mg/dL   Total Protein 5.4 (L) 6.5 - 8.1 g/dL   Albumin 2.5 (L) 3.5 - 5.0 g/dL   AST 24 15 - 41 U/L   ALT 11 0 - 44 U/L   Alkaline Phosphatase 53 38 - 126 U/L   Total Bilirubin 1.0 0.3 - 1.2 mg/dL   GFR, Estimated >60 >60 mL/min    Comment: (NOTE) Calculated using the CKD-EPI Creatinine Equation (2021)    Anion gap 5 5 - 15    Comment: Performed at [facility], [place] [address], [place], [place] [address]  Protime-INR     Status: Abnormal   Collection Time: 03/01/21  5:24 AM  Result Value Ref Range   Prothrombin Time 37.4 (H) 11.4 - 15.2 seconds   INR 3.8 (H) 0.8 - 1.2    Comment: (NOTE) INR goal varies based on device and disease states. Performed at [facility], [place] [address], [place], [place] [address]   Basic metabolic panel     Status: Abnormal   Collection Time: 03/01/21  5:24 AM  Result Value Ref Range   Sodium 132 (L) 135 - 145 mmol/L   Potassium 3.6 3.5 - 5.1 mmol/L   Chloride 98 98 - 111 mmol/L   CO2 27 22 - 32 mmol/L   Glucose, Bld 105 (H) 70 - 99 mg/dL    Comment: Glucose reference range applies only to samples taken after fasting for at least 8 hours.   BUN 13 8 - 23 mg/dL   Creatinine, Ser 0.90 0.61 - 1.24 mg/dL   Calcium 7.7 (L) 8.9 - 10.3 mg/dL   GFR, Estimated >60 >60 mL/min    Comment: (NOTE) Calculated using the CKD-EPI Creatinine Equation (2021)     Anion gap 7 5 - 15    Comment: Performed at [facility], [place] [address], [place], [place] [address]  CBC     Status: Abnormal   Collection Time: 03/01/21  5:24 AM  Result Value Ref Range   WBC 3.5 (L) 4.0 - 10.5 K/uL   RBC 2.99 (L) 4.22 - 5.81 MIL/uL   Hemoglobin 9.0 (L) 13.0 - 17.0 g/dL   HCT 28.5 (L) 39.0 - 52.0 %   MCV 95.3 80.0 - 100.0 fL   MCH 30.1 26.0 - 34.0 pg   MCHC 31.6 30.0 - 36.0 g/dL   RDW 16.9 (H) 11.5 - 15.5 %   Platelets 155 150 - 400 K/uL   nRBC 0.0 0.0 - 0.2 %    Comment: Performed at [facility], [place] [address], [place], [place] [address]  Magnesium     Status: None   Collection Time: 03/01/21  5:24 AM  Result Value Ref Range   Magnesium 1.7 1.7 - 2.4 mg/dL    Comment: Performed at [facility], [place] [address], [place], [place] [address]  Phosphorus     Status: None   Collection Time: 03/01/21  5:24 AM  Result Value Ref Range   Phosphorus 3.1 2.5 - 4.6 mg/dL    Comment: Performed at [facility], [place] [address], [place], [place] [address]    ECG   AFib at 81, low voltage - Personally Reviewed  Telemetry  AFib with PVC's - Personally Reviewed  Radiology    ECHOCARDIOGRAM COMPLETE  Result Date: 02/27/2021    ECHOCARDIOGRAM REPORT   Patient Name:   Anthony Gould Date of Exam: 02/27/2021 Medical Rec #:  [id number]         Height:       69.0 in Accession #:    [id number]        Weight:       167.6 lb Date of Birth:  [birth year]         BSA:          1.917 m Patient Age:    29 years          BP:           124/56 mmHg Patient Gender: M                 HR:           69 bpm. Exam Location:  Inpatient Procedure: 2D Echo, 3D Echo, Cardiac Doppler and Color Doppler Indications:     I50.40* Unspecified combined systolic (congestive) and                  diastolic (congestive) heart failure  History:         Patient has prior history of Echocardiogram examinations, most                   recent 03/09/2019. CHF, Abnormal ECG, Pulmonary HTN and TIA,                  Mitral Valve Disease, Arrythmias:Atrial Fibrillation and NSVT,                  Signs/Symptoms:Chest Pain and Syncope; Risk                  Factors:Hypertension and Diabetes. Mechanical mitral valve.                   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 Procedure Date: 12.  Sonographer:     Roseanna Rainbow RDCS Referring Phys:  [id number] [place] Diagnosing Phys: Oswaldo Milian MD  Sonographer Comments: Technically difficult study due to poor echo windows. IMPRESSIONS  1. Left ventricular ejection fraction, by estimation, is 40 to 45%. The left ventricle has mildly decreased function. The left ventricle demonstrates regional wall motion abnormalities (see scoring diagram/findings for description). Septal hypokinesis. There is moderate left ventricular hypertrophy. Left ventricular diastolic parameters are indeterminate.  2. Right ventricular systolic function is moderately reduced. The right ventricular size is moderately enlarged. There is moderately elevated pulmonary artery systolic pressure. The estimated right ventricular systolic pressure is 40.8 mmHg.  3. Left atrial size was severely dilated.  4. Right atrial size was severely dilated.  5. 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 Procedure Date: 34. Trivial mitral valve regurgitation.     MG 7mmHg at 63bpm, EOA 2.4 cm^2  6. The tricuspid valve is abnormal. Tricuspid valve regurgitation is severe.  7. The aortic valve is tricuspid. Aortic valve regurgitation is trivial. Aortic valve sclerosis is present, with no evidence of aortic valve stenosis.  8. The inferior vena cava is dilated in size with <50% respiratory variability, suggesting right atrial pressure of 15 mmHg. FINDINGS  Left Ventricle: Left ventricular  ejection fraction, by estimation, is 40 to 45%. The  left ventricle has mildly decreased function. The left ventricle demonstrates regional wall motion abnormalities. The left ventricular internal cavity size was normal in size. There is moderate left ventricular hypertrophy. Left ventricular diastolic parameters are indeterminate.  LV Wall Scoring: The entire septum is hypokinetic. The entire anterior wall, entire lateral wall, entire inferior wall, and apex are normal. Right Ventricle: The right ventricular size is moderately enlarged. Right vetricular wall thickness was not well visualized. Right ventricular systolic function is moderately reduced. There is moderately elevated pulmonary artery systolic pressure. The tricuspid regurgitant velocity is 3.09 m/s, and with an assumed right atrial pressure of 15 mmHg, the estimated right ventricular systolic pressure is 63.8 mmHg. Left Atrium: Left atrial size was severely dilated. Right Atrium: Right atrial size was severely dilated. Pericardium: There is no evidence of pericardial effusion. Mitral Valve: The mitral valve has been repaired/replaced. Trivial mitral valve regurgitation. There is a unknown size Medtronic tilting disc valve present in the mitral position. Procedure Date: 76. MV peak gradient, 13.5 mmHg. The mean mitral valve gradient is 4.0 mmHg. Tricuspid Valve: The tricuspid valve is abnormal. Tricuspid valve regurgitation is severe. Aortic Valve: The aortic valve is tricuspid. Aortic valve regurgitation is trivial. Aortic valve sclerosis is present, with no evidence of aortic valve stenosis. Pulmonic Valve: The pulmonic valve was not well visualized. Pulmonic valve regurgitation is trivial. Aorta: The aortic root and ascending aorta are structurally normal, with no evidence of dilitation. Venous: The inferior vena cava is dilated in size with less than 50% respiratory variability, suggesting right atrial pressure of 15 mmHg. IAS/Shunts: The interatrial septum was not well visualized.  LEFT VENTRICLE  PLAX 2D LVIDd:         5.10 cm      Diastology LVIDs:         4.00 cm      LV e' medial:    7.40 cm/s LV PW:         2.00 cm      LV E/e' medial:  22.2 LV IVS:        0.80 cm      LV e' lateral:   7.29 cm/s LVOT diam:     2.40 cm      LV E/e' lateral: 22.5 LV SV:         97 LV SV Index:   51 LVOT Area:     4.52 cm  LV Volumes (MOD) LV vol d, MOD A2C: 135.0 ml LV vol d, MOD A4C: 105.0 ml LV vol s, MOD A2C: 69.9 ml LV vol s, MOD A4C: 59.0 ml LV SV MOD A2C:     65.1 ml LV SV MOD A4C:     105.0 ml LV SV MOD BP:      53.6 ml RIGHT VENTRICLE            IVC RV S prime:     8.33 cm/s  IVC diam: 2.70 cm TAPSE (M-mode): 1.4 cm LEFT ATRIUM            Index        RIGHT ATRIUM           Index LA diam:      5.80 cm  3.03 cm/m   RA Area:     39.60 cm LA Vol (A2C): 72.1 ml  37.62 ml/m  RA Volume:   166.00 ml 86.62 ml/m LA Vol (A4C): 140.0 ml 73.05 ml/m  AORTIC VALVE  PULMONIC VALVE LVOT Vmax:   104.00 cm/s PR End Diast Vel: 2.22 msec LVOT Vmean:  67.300 cm/s LVOT VTI:    0.215 m  AORTA Ao Root diam: 3.60 cm Ao Asc diam:  3.50 cm MITRAL VALVE                TRICUSPID VALVE MV Area (PHT): 3.48 cm     TR Peak grad:   38.2 mmHg MV Area VTI:   2.61 cm     TR Vmax:        309.00 cm/s MV Peak grad:  13.5 mmHg MV Mean grad:  4.0 mmHg     SHUNTS MV Vmax:       1.84 m/s     Systemic VTI:  0.22 m MV Vmean:      88.9 cm/s    Systemic Diam: 2.40 cm MV Decel Time: 218 msec MV E velocity: 164.00 cm/s Oswaldo Milian MD Electronically signed by Oswaldo Milian MD Signature Date/Time: 02/27/2021/6:41:19 PM    Final (Updated)     Cardiac Studies   See above  Assessment   Principal Problem:   Recurrent colitis due to Clostridium difficile Active Problems:   DM2 (diabetes mellitus, type 2) ([place])   Essential hypertension   H/O mitral valve replacement with mechanical valve   Hypokalemia   Persistent atrial fibrillation (HCC)   Diarrhea   Biventricular heart failure ([place])   Plan   Breathing well -lying  flat. Now with semi-solid stools. Continue Entresto - will transition to oral lasix today. Probably close to d/c. Some NSVT overnight - asymptomatic, on bisoprolol.  Time Spent Directly with Patient:  I have spent a total of 25 minutes with the patient reviewing hospital notes, telemetry, EKGs, labs and examining the patient as well as establishing an assessment and plan that was discussed personally with the patient.  > 50% of time was spent in direct patient care.  Length of Stay:  LOS: 1 day   Pixie Casino, MD, [facility] [place] Director of the Advanced Lipid Disorders &  Cardiovascular Risk Reduction Clinic Diplomate of the American Board of Clinical Lipidology Attending Cardiologist  Direct Dial: [phone]  Fax: [phone]  Website:  [email or web address] [email or web address] Apryle Stowell 03/01/2021, 10:43 AM

## 2021-03-01 NOTE — Assessment & Plan Note (Addendum)
-   Ongoing diarrhea with antigen positive, toxin negative, PCR positive -Continue fidaxomicin to complete course -Stools have been improving.  Patient denies significant abdominal pain - possible confusion with treatment instructions at last discharge; he was again arranged to have fidaxomicin this discharge and was delivered to his house and he was given detailed instructions on AVS for remainder of course

## 2021-03-01 NOTE — Assessment & Plan Note (Addendum)
Repleted. °

## 2021-03-01 NOTE — Assessment & Plan Note (Signed)
-   Replete as needed 

## 2021-03-01 NOTE — Assessment & Plan Note (Signed)
Continue current regimen

## 2021-03-01 NOTE — Progress Notes (Signed)
   03/01/21 1300  Mobility  Activity Ambulated in hall  Level of Assistance Standby assist, set-up cues, supervision of patient - no hands on  Assistive Device Front wheel walker  Distance Ambulated (ft) 500 ft  Mobility Ambulated with assistance in hallway  Mobility Response Tolerated well  Mobility performed by Mobility specialist  $Mobility charge 1 Mobility   Pt agreeable to mobilize this afternoon. Ambulated about 548ft in hall with RW, tolerated well. No complaints. Left pt in bed with call bell at side and RN present.  [place] [facility] Acute Rehab Services Office: [phone]

## 2021-03-01 NOTE — Assessment & Plan Note (Addendum)
-   Continue Entresto per cardiology - Continue bisoprolol -Possible outpatient coronary evaluation - he will follow up outpatient with cardiology at discharge

## 2021-03-01 NOTE — Progress Notes (Signed)
Progress Note    Anthony Gould   BSW:[id number]  DOB: 09/02/1933  DOA: 02/26/2021     1 PCP: Cassandria Anger, MD  Initial CC: Diarrhea  Hospital Course: Mr. Getter is an 87 yo male with PMH severe MR s/p MVR on Coumadin, permanent afib, dCHF, pulm HTN, CKDIII, TIA who presented with multiple episodes of watery diarrhea at home and fever. He has had some recent exposure to healthcare appendectomy followed by infected hematoma/abscess treated with antibiotics.  He then developed C. difficile colitis and was treated with oral vancomycin and Flagyl which was then transitioned to rifaximin due to [place] with Coumadin.  He completed treatment and had some improvement of his stools however they have transitioned back to significant diarrhea. He underwent repeat C. difficile testing on admission which was positive and he was started on fidaxomicin.   Interval History:  No events overnight.  Resting in bed comfortable this morning.  Diarrhea has been improving since admission.  Denies any chest pain, palpitations, shortness of breath.  Assessment & Plan: * Recurrent colitis due to Clostridium difficile - Ongoing diarrhea with antigen positive, toxin negative, PCR positive -Continue fidaxomicin to complete course -Stools have been improving.  Patient denies significant abdominal pain  Persistent atrial fibrillation (HCC) - On chronic Coumadin for history of mechanical mitral valve - Follows with cardiology - Continue bisoprolol  Biventricular heart failure ([place]) - Continue Entresto per cardiology - Continue bisoprolol -Possible outpatient coronary evaluation  Hypokalemia - Replete as needed  H/O mitral valve replacement with mechanical valve - INR goal 2.5-3.5 - Patient endorses valve replacement approximately 37 years ago - Continue Coumadin  Essential hypertension - Continue current regimen  DM2 (diabetes mellitus, type 2) (HCC) - Last A1c 4.9% on  02/04/2021 - Continue diet control  Hypophosphatemia - Replete as needed    Old records reviewed in assessment of this patient  Antimicrobials: Fidaxomicin 02/28/2021 >> current  DVT prophylaxis: Coumadin  Code Status:   Disposition Plan:   Status is: Inpt  Objective: Blood pressure 122/64, pulse 75, temperature (!) 97.5 F (36.4 C), temperature source Oral, resp. rate 18, height 5\' 9"  (1.753 m), weight 78.9 kg, SpO2 95 %.  Examination:  Physical Exam Constitutional:      General: He is not in acute distress.    Appearance: Normal appearance.  HENT:     Head: Normocephalic and atraumatic.     Mouth/Throat:     Mouth: Mucous membranes are moist.  Eyes:     Extraocular Movements: Extraocular movements intact.  Cardiovascular:     Rate and Rhythm: Normal rate.     Heart sounds: Normal heart sounds.     Comments: Mechanical click appreciated  Pulmonary:     Effort: Pulmonary effort is normal. No respiratory distress.     Breath sounds: Normal breath sounds. No wheezing.  Abdominal:     General: Bowel sounds are normal. There is no distension.     Palpations: Abdomen is soft.     Tenderness: There is no abdominal tenderness.  Musculoskeletal:        General: Normal range of motion.     Cervical back: Normal range of motion and neck supple.  Skin:    General: Skin is warm and dry.  Neurological:     General: No focal deficit present.     Mental Status: He is alert.  Psychiatric:        Mood and Affect: Mood normal.  Behavior: Behavior normal.     Consultants:  Cardiology  Procedures:    Data Reviewed: I have personally reviewed labs and imaging studies    LOS: 1 day  Time spent: Greater than 50% of the 35 minute visit was spent in counseling/coordination of care for the patient as laid out in the A&P.   Dwyane Dee, MD [facility] 03/01/2021, 3:41 PM

## 2021-03-01 NOTE — Progress Notes (Signed)
ANTICOAGULATION CONSULT NOTE    Pharmacy Consult for warfarin Indication:  mechanical valve  Allergies  Allergen Reactions   Diltiazem Other (See Comments)    "Bradycardia," per pt; tolerates Amlodipine   Sulfa Antibiotics Rash and Other (See Comments)    Reaction not fully recalled    Sulfonamide Derivatives Rash and Other (See Comments)    Reaction not fully recalled     Patient Measurements: Height: 5\' 9"  (175.3 cm) Weight: 78.9 kg (173 lb 15.1 oz) IBW/kg (Calculated) : 70.7  Vital Signs: Temp: 97.5 F (36.4 C) (12/07 1239) Temp Source: Oral (12/07 1239) BP: 122/64 (12/07 1239) Pulse Rate: 75 (12/07 1239)  Labs: Recent Labs    02/26/21 2105 02/27/21 0020 02/27/21 0426 02/27/21 0702 02/28/21 0513 03/01/21 0524  HGB 10.4*  --   --   --  9.1* 9.0*  HCT 32.3*  --   --   --  28.6* 28.5*  PLT 164  --   --   --  141* 155  LABPROT  --    < > 36.7*  --  34.4* 37.4*  INR  --    < > 3.7*  --  3.4* 3.8*  CREATININE 0.93  --   --  0.91 0.66 0.90   < > = values in this interval not displayed.     Estimated Creatinine Clearance: 60 mL/min (by C-G formula based on SCr of 0.9 mg/dL).   Medical History: 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   NSVT (nonsustained ventricular tachycardia)    Peripheral edema    venous insufficiency   Permanent atrial fibrillation (HCC)    Pulmonary hypertension (HCC)    Syncope    TIA (transient ischemic attack)    while on coumadin   Type II or unspecified type diabetes mellitus without mention of complication, not stated as uncontrolled    lifestyle mangement   Ventricular ectopy    symptomatic    Assessment: 87 y.o. male with medical history significant for severe mitral regurgitation s/p MVR in 1986 on warfarin,  permanent atrial fibrillation, diastolic CHF, pulmonary hypertension, hypertension, hyperlipidemia, type 2 diabetes, CKD stage III, TIA who presents with persistent diarrhea.  Warfarin per pharmacy  Home dose warfarin 2.5mg  on Mon and Friday, 5mg  all other days.  LD 12/4 @ 0800  03/01/21 1:58 PM  INR 3.4. Therapeutic  no bleeding reported CBC low but stable    Goal of Therapy:  INR 3-3.5   Plan:  Hold warfarin dose today  Daily INR  Monitor for signs and symptoms of bleeding   Royetta Asal, PharmD, BCPS 03/01/2021 1:58 PM

## 2021-03-02 DIAGNOSIS — A0471 Enterocolitis due to Clostridium difficile, recurrent: Secondary | ICD-10-CM | POA: Diagnosis not present

## 2021-03-02 LAB — CBC WITH DIFFERENTIAL/PLATELET
Abs Immature Granulocytes: 0.01 10*3/uL (ref 0.00–0.07)
Basophils Absolute: 0 10*3/uL (ref 0.0–0.1)
Basophils Relative: 1 %
Eosinophils Absolute: 0.2 10*3/uL (ref 0.0–0.5)
Eosinophils Relative: 4 %
HCT: 28.6 % — ABNORMAL LOW (ref 39.0–52.0)
Hemoglobin: 9.2 g/dL — ABNORMAL LOW (ref 13.0–17.0)
Immature Granulocytes: 0 %
Lymphocytes Relative: 32 %
Lymphs Abs: 1.2 10*3/uL (ref 0.7–4.0)
MCH: 30.5 pg (ref 26.0–34.0)
MCHC: 32.2 g/dL (ref 30.0–36.0)
MCV: 94.7 fL (ref 80.0–100.0)
Monocytes Absolute: 0.6 10*3/uL (ref 0.1–1.0)
Monocytes Relative: 14 %
Neutro Abs: 1.9 10*3/uL (ref 1.7–7.7)
Neutrophils Relative %: 49 %
Platelets: 174 10*3/uL (ref 150–400)
RBC: 3.02 MIL/uL — ABNORMAL LOW (ref 4.22–5.81)
RDW: 16.8 % — ABNORMAL HIGH (ref 11.5–15.5)
WBC: 3.9 10*3/uL — ABNORMAL LOW (ref 4.0–10.5)
nRBC: 0 % (ref 0.0–0.2)

## 2021-03-02 LAB — BASIC METABOLIC PANEL
Anion gap: 7 (ref 5–15)
BUN: 13 mg/dL (ref 8–23)
CO2: 27 mmol/L (ref 22–32)
Calcium: 7.5 mg/dL — ABNORMAL LOW (ref 8.9–10.3)
Chloride: 96 mmol/L — ABNORMAL LOW (ref 98–111)
Creatinine, Ser: 0.77 mg/dL (ref 0.61–1.24)
GFR, Estimated: >60 mL/min (ref >60)
Glucose, Bld: 94 mg/dL (ref 70–99)
Potassium: 3.1 mmol/L — ABNORMAL LOW (ref 3.5–5.1)
Sodium: 130 mmol/L — ABNORMAL LOW (ref 135–145)

## 2021-03-02 LAB — MAGNESIUM: Magnesium: 1.5 mg/dL — ABNORMAL LOW (ref 1.7–2.4)

## 2021-03-02 LAB — PROTIME-INR
INR: 3.4 — ABNORMAL HIGH (ref 0.8–1.2)
Prothrombin Time: 34.1 seconds — ABNORMAL HIGH (ref 11.4–15.2)

## 2021-03-02 MED ORDER — WARFARIN SODIUM 5 MG PO TABS
5.0000 mg | ORAL_TABLET | Freq: Once | ORAL | Status: AC
  Administered 2021-03-02: 5 mg via ORAL
  Filled 2021-03-02: qty 1

## 2021-03-02 MED ORDER — POTASSIUM CHLORIDE CRYS ER 20 MEQ PO TBCR
40.0000 meq | EXTENDED_RELEASE_TABLET | ORAL | Status: AC
  Administered 2021-03-02 (×2): 40 meq via ORAL
  Filled 2021-03-02 (×2): qty 2

## 2021-03-02 MED ORDER — MAGNESIUM SULFATE 2 GM/50ML IV SOLN
2.0000 g | Freq: Once | INTRAVENOUS | Status: AC
  Administered 2021-03-02: 2 g via INTRAVENOUS
  Filled 2021-03-02: qty 50

## 2021-03-02 NOTE — Progress Notes (Signed)
Physical Therapy Treatment and Discharge from Acute PT Patient Details Name: Anthony Gould MRN: [id number] DOB: 1933/11/21 Today's Date: 03/02/2021   History of Present Illness Patient is 87 y.o. male presented to hospital with episodes of watery diarrhea at home with fever. Of note patient has been in the hospital recently few times initially with acute appendicitis and had appendectomy followed by infected hematoma and abscess and had been on antibiotic.  Patient had developed C. difficile colitis and was initially treated with p.o. vancomycin and Flagyl and was subsequently changed to rifaximin since it interfered with Coumadin. Pt admitted for concerns of possible second episode fo C. diff. PMH significant for severe mitral regurgitation status post MVR on Coumadin, permanent atrial fibrillation, diastolic congestive heart failure, pulmonary hypertension,  CKD stage III, TIA.    PT Comments    Pt eager to mobilize and ambulated good distance in hallway.  Pt also performed standing exercises with RW for support.  Pt reports he has HEP from [place] which was initiated prior to hospitalization.  Continue to recommend HHPT upon d/c.  Pt agreeable to continue to ambulate with nursing staff and/or mobility specialists, and PT to sign off at this time.  Pt plans to f/u with HHPT.    Recommendations for follow up therapy are one component of a multi-disciplinary discharge planning process, led by the attending physician.  Recommendations may be updated based on patient status, additional functional criteria and insurance authorization.  Follow Up Recommendations  Home health PT     Assistance Recommended at Discharge Intermittent Supervision/Assistance  Equipment Recommendations  None recommended by PT    Recommendations for Other Services       Precautions / Restrictions Precautions Precautions: Fall Precaution Comments: incontinent, has been wearing briefs for ambulating     Mobility   Bed Mobility Overal bed mobility: Modified Independent                  Transfers Overall transfer level: Needs assistance Equipment used: Rolling walker (2 wheels) Transfers: Sit to/from Stand Sit to Stand: Min guard           General transfer comment: increased time and effort however reports more "sciatica" type pain today    Ambulation/Gait Ambulation/Gait assistance: Min guard Gait Distance (Feet): 350 Feet Assistive device: Rolling walker (2 wheels) Gait Pattern/deviations: Step-through pattern;Decreased stride length       General Gait Details: no unsteadiness with RW   Stairs             Wheelchair Mobility    Modified Rankin (Stroke Patients Only)       Balance                                            Cognition Arousal/Alertness: Awake/alert Behavior During Therapy: WFL for tasks assessed/performed Overall Cognitive Status: Within Functional Limits for tasks assessed                                          Exercises General Exercises - Lower Extremity Hip ABduction/ADduction: AROM;Both;10 reps;Standing Hip Flexion/Marching: AROM;Both;10 reps;Standing Heel Raises: AROM;Both;10 reps;Standing Mini-Sqauts: AROM;Both;10 reps;Standing    General Comments        Pertinent Vitals/Pain Pain Assessment: Faces Faces Pain Scale: Hurts little more Pain Location: shooting  leg pain (from "sciatica" per pt) Pain Descriptors / Indicators: Shooting Pain Intervention(s): Repositioned;Monitored during session    Home Living                          Prior Function            PT Goals (current goals can now be found in the care plan section) Progress towards PT goals: Goals met/education completed, patient discharged from PT    Frequency    Min 3X/week      PT Plan Current plan remains appropriate    Co-evaluation              AM-PAC PT "6 Clicks" Mobility   Outcome  Measure  Help needed turning from your back to your side while in a flat bed without using bedrails?: A Little Help needed moving from lying on your back to sitting on the side of a flat bed without using bedrails?: A Little Help needed moving to and from a bed to a chair (including a wheelchair)?: A Little Help needed standing up from a chair using your arms (e.g., wheelchair or bedside chair)?: A Little Help needed to walk in hospital room?: A Little Help needed climbing 3-5 steps with a railing? : A Little 6 Click Score: 18    End of Session Equipment Utilized During Treatment: Gait belt Activity Tolerance: Patient tolerated treatment well Patient left: in bed;with call bell/phone within reach Nurse Communication: Mobility status PT Visit Diagnosis: Difficulty in walking, not elsewhere classified (R26.2)     Time: 3329-5188 PT Time Calculation (min) (ACUTE ONLY): 17 min  Charges:  $Gait Training: 8-22 mins                     Arlyce Dice, DPT Acute Rehabilitation Services Pager: [phone] Office: [place] 03/02/2021, 4:46 PM

## 2021-03-02 NOTE — Progress Notes (Signed)
Progress Note    Anthony Gould   YTK:[id number]  DOB: [birth year]  DOA: 02/26/2021     2 PCP: Cassandria Anger, MD  Initial CC: Diarrhea  Hospital Course: Anthony Gould is an 87 yo male with PMH severe MR s/p MVR on Coumadin, permanent afib, dCHF, pulm HTN, CKDIII, TIA who presented with multiple episodes of watery diarrhea at home and fever. He has had some recent exposure to healthcare appendectomy followed by infected hematoma/abscess treated with antibiotics.  He then developed C. difficile colitis and was treated with oral vancomycin and Flagyl which was then transitioned to rifaximin due to [place] with Coumadin.  He completed treatment and had some improvement of his stools however they have transitioned back to significant diarrhea. He underwent repeat C. difficile testing on admission which was positive and he was started on fidaxomicin.   Interval History:  No events overnight.  Had an episode of diarrhea this morning but overall it has been improving since admission.  After discussion with his wife bedside, there appears to be some confusion whether or not he actually finished his fidaxomicin at home from the last hospitalization.  Assessment & Plan: * Recurrent colitis due to Clostridium difficile - Ongoing diarrhea with antigen positive, toxin negative, PCR positive -Continue fidaxomicin to complete course -Stools have been improving.  Patient denies significant abdominal pain  Persistent atrial fibrillation (HCC) - On chronic Coumadin for history of mechanical mitral valve - Follows with cardiology - Continue bisoprolol  Biventricular heart failure ([place]) - Continue Entresto per cardiology - Continue bisoprolol -Possible outpatient coronary evaluation  Hypokalemia - Replete as needed  H/O mitral valve replacement with mechanical valve - INR goal 2.5-3.5 - Patient endorses valve replacement approximately 37 years ago - Continue  Coumadin  Essential hypertension - Continue current regimen  DM2 (diabetes mellitus, type 2) (HCC) - Last A1c 4.9% on 02/04/2021 - Continue diet control  Hypophosphatemia - Replete as needed    Old records reviewed in assessment of this patient  Antimicrobials: Fidaxomicin 02/28/2021 >> current  DVT prophylaxis: Coumadin  Code Status:   Disposition Plan:   Status is: Inpt  Objective: Blood pressure 106/63, pulse 76, temperature 98.4 F (36.9 C), resp. rate 18, height 5\' 9"  (1.753 m), weight 79 kg, SpO2 97 %.  Examination:  Physical Exam Constitutional:      General: He is not in acute distress.    Appearance: Normal appearance.  HENT:     Head: Normocephalic and atraumatic.     Mouth/Throat:     Mouth: Mucous membranes are moist.  Eyes:     Extraocular Movements: Extraocular movements intact.  Cardiovascular:     Rate and Rhythm: Normal rate.     Heart sounds: Normal heart sounds.     Comments: Mechanical click appreciated  Pulmonary:     Effort: Pulmonary effort is normal. No respiratory distress.     Breath sounds: Normal breath sounds. No wheezing.  Abdominal:     General: Bowel sounds are normal. There is no distension.     Palpations: Abdomen is soft.     Tenderness: There is no abdominal tenderness.  Musculoskeletal:        General: Normal range of motion.     Cervical back: Normal range of motion and neck supple.  Skin:    General: Skin is warm and dry.  Neurological:     General: No focal deficit present.     Mental Status: He is alert.  Psychiatric:  Mood and Affect: Mood normal.        Behavior: Behavior normal.     Consultants:  Cardiology  Procedures:    Data Reviewed: I have personally reviewed labs and imaging studies    LOS: 2 days  Time spent: Greater than 50% of the 35 minute visit was spent in counseling/coordination of care for the patient as laid out in the A&P.   Dwyane Dee, MD [facility] 03/02/2021,  3:43 PM

## 2021-03-02 NOTE — Progress Notes (Addendum)
Echo with LVEF 40-45% GDMT includes bisoprolol, entresto 24-26 mg BID, 40 mg lasix PO  K 3.1 and Mg 1.5 --> aggressively replace per primary  INR 3.4 - per pharmacy  Will not increase diuretic in the setting of C diff.   Recommend daily weights, low sodium diet.   Cardiology follow up has been arranged.    [facility] will sign off.   Medication Recommendations:  as in [facility] Other recommendations (labs, testing, etc):  none Follow up as an outpatient:  as scheduled   Ledora Bottcher, [name] 03/02/2021, 12:47 PM [place] [address] [place], [place] [address]

## 2021-03-02 NOTE — Progress Notes (Signed)
[place] for warfarin Indication:  mechanical valve  Allergies  Allergen Reactions   Diltiazem Other (See Comments)    "Bradycardia," per pt; tolerates Amlodipine   Sulfa Antibiotics Rash and Other (See Comments)    Reaction not fully recalled    Sulfonamide Derivatives Rash and Other (See Comments)    Reaction not fully recalled     Patient Measurements: Height: 5\' 9"  (175.3 cm) Weight: 79 kg (174 lb 2.6 oz) IBW/kg (Calculated) : 70.7  Vital Signs: Temp: 97.8 F (36.6 C) (12/08 0500) Temp Source: Oral (12/08 0500) BP: 109/57 (12/08 0500) Pulse Rate: 77 (12/08 0500)  Labs: Recent Labs    02/28/21 0513 03/01/21 0524 03/02/21 0529  HGB 9.1* 9.0* 9.2*  HCT 28.6* 28.5* 28.6*  PLT 141* 155 174  LABPROT 34.4* 37.4* 34.1*  INR 3.4* 3.8* 3.4*  CREATININE 0.66 0.90 0.77     Estimated Creatinine Clearance: 67.5 mL/min (by C-G formula based on SCr of 0.77 mg/dL).   Medical History: 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   NSVT (nonsustained ventricular tachycardia)    Peripheral edema    venous insufficiency   Permanent atrial fibrillation (HCC)    Pulmonary hypertension (HCC)    Syncope    TIA (transient ischemic attack)    while on coumadin   Type II or unspecified type diabetes mellitus without mention of complication, not stated as uncontrolled    lifestyle mangement   Ventricular ectopy    symptomatic    Assessment: 87 y.o. male with medical history significant for severe mitral regurgitation s/p MVR in 1986 on warfarin, permanent atrial fibrillation, diastolic CHF, pulmonary hypertension, hypertension, hyperlipidemia, type 2 diabetes, CKD stage III, TIA who presents with persistent diarrhea.  Warfarin  per pharmacy  Home dose warfarin 2.5mg  on Mon and Friday, 5mg  all other days.  LD 12/4 @ 0800  03/02/21 12:51 PM  INR 3.4. Therapeutic  no bleeding reported CBC low but stable    Goal of Therapy:  INR 3-3.5   Plan:  Warfarin  5 mg PO x 1  Daily INR  Monitor for signs and symptoms of bleeding   Royetta Asal, PharmD, BCPS 03/02/2021 12:51 PM

## 2021-03-03 ENCOUNTER — Telehealth: Payer: Self-pay

## 2021-03-03 DIAGNOSIS — I5082 Biventricular heart failure: Secondary | ICD-10-CM | POA: Diagnosis not present

## 2021-03-03 DIAGNOSIS — A0471 Enterocolitis due to Clostridium difficile, recurrent: Secondary | ICD-10-CM | POA: Diagnosis not present

## 2021-03-03 LAB — CBC WITH DIFFERENTIAL/PLATELET
Abs Immature Granulocytes: 0.02 10*3/uL (ref 0.00–0.07)
Basophils Absolute: 0 10*3/uL (ref 0.0–0.1)
Basophils Relative: 1 %
Eosinophils Absolute: 0.3 10*3/uL (ref 0.0–0.5)
Eosinophils Relative: 5 %
HCT: 36.5 % — ABNORMAL LOW (ref 39.0–52.0)
Hemoglobin: 11.4 g/dL — ABNORMAL LOW (ref 13.0–17.0)
Immature Granulocytes: 0 %
Lymphocytes Relative: 44 %
Lymphs Abs: 2.5 10*3/uL (ref 0.7–4.0)
MCH: 29.8 pg (ref 26.0–34.0)
MCHC: 31.2 g/dL (ref 30.0–36.0)
MCV: 95.5 fL (ref 80.0–100.0)
Monocytes Absolute: 0.7 10*3/uL (ref 0.1–1.0)
Monocytes Relative: 12 %
Neutro Abs: 2.2 10*3/uL (ref 1.7–7.7)
Neutrophils Relative %: 38 %
Platelets: 272 10*3/uL (ref 150–400)
RBC: 3.82 MIL/uL — ABNORMAL LOW (ref 4.22–5.81)
RDW: 16.7 % — ABNORMAL HIGH (ref 11.5–15.5)
WBC: 5.8 10*3/uL (ref 4.0–10.5)
nRBC: 0 % (ref 0.0–0.2)

## 2021-03-03 LAB — BASIC METABOLIC PANEL
Anion gap: 8 (ref 5–15)
BUN: 13 mg/dL (ref 8–23)
CO2: 27 mmol/L (ref 22–32)
Calcium: 8.5 mg/dL — ABNORMAL LOW (ref 8.9–10.3)
Chloride: 96 mmol/L — ABNORMAL LOW (ref 98–111)
Creatinine, Ser: 0.82 mg/dL (ref 0.61–1.24)
GFR, Estimated: >60 mL/min (ref >60)
Glucose, Bld: 113 mg/dL — ABNORMAL HIGH (ref 70–99)
Potassium: 4.7 mmol/L (ref 3.5–5.1)
Sodium: 131 mmol/L — ABNORMAL LOW (ref 135–145)

## 2021-03-03 LAB — PROTIME-INR
INR: 2.5 — ABNORMAL HIGH (ref 0.8–1.2)
Prothrombin Time: 26.9 seconds — ABNORMAL HIGH (ref 11.4–15.2)

## 2021-03-03 LAB — MAGNESIUM: Magnesium: 1.8 mg/dL (ref 1.7–2.4)

## 2021-03-03 MED ORDER — FIDAXOMICIN 200 MG PO TABS: 200.0000 mg | ORAL_TABLET | ORAL | 0 refills | Status: DC

## 2021-03-03 MED ORDER — FUROSEMIDE 40 MG PO TABS: 40.0000 mg | ORAL_TABLET | Freq: Every day | ORAL | 3 refills | Status: DC

## 2021-03-03 MED ORDER — WARFARIN SODIUM 5 MG PO TABS: 5.0000 mg | ORAL_TABLET | Freq: Once | ORAL | Status: DC

## 2021-03-03 MED ORDER — SACUBITRIL-VALSARTAN 24-26 MG PO TABS: 1.0000 | ORAL_TABLET | Freq: Two times a day (BID) | ORAL | 3 refills | Status: DC

## 2021-03-03 NOTE — Progress Notes (Signed)
[place] for warfarin Indication:  mechanical valve  Allergies  Allergen Reactions   Diltiazem Other (See Comments)    "Bradycardia," per pt; tolerates Amlodipine   Sulfa Antibiotics Rash and Other (See Comments)    Reaction not fully recalled    Sulfonamide Derivatives Rash and Other (See Comments)    Reaction not fully recalled     Patient Measurements: Height: 5\' 9"  (175.3 cm) Weight: 79.4 kg (175 lb 0.7 oz) IBW/kg (Calculated) : 70.7  Vital Signs: BP: 122/58 (12/09 0414) Pulse Rate: 65 (12/09 0414)  Labs: Recent Labs    03/01/21 0524 03/02/21 0529 03/03/21 0702  HGB 9.0* 9.2* 11.4*  HCT 28.5* 28.6* 36.5*  PLT 155 174 272  LABPROT 37.4* 34.1* 26.9*  INR 3.8* 3.4* 2.5*  CREATININE 0.90 0.77 0.82     Estimated Creatinine Clearance: 65.9 mL/min (by C-G formula based on SCr of 0.82 mg/dL).   Medical History: 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   NSVT (nonsustained ventricular tachycardia)    Peripheral edema    venous insufficiency   Permanent atrial fibrillation (HCC)    Pulmonary hypertension (HCC)    Syncope    TIA (transient ischemic attack)    while on coumadin   Type II or unspecified type diabetes mellitus without mention of complication, not stated as uncontrolled    lifestyle mangement   Ventricular ectopy    symptomatic    Assessment: 87 y.o. male with medical history significant for severe mitral regurgitation s/p MVR in 1986 on warfarin, permanent atrial fibrillation, diastolic CHF, pulmonary hypertension, hypertension, hyperlipidemia, type 2 diabetes, CKD stage III, TIA who presents with persistent diarrhea.  Warfarin per pharmacy  Home dose warfarin 2.5mg  on Mon and Friday, 5mg  all  other days.  LD 12/4 @ 0800  03/03/21 10:09 AM  INR 2.5. subtherapeutic  no bleeding reported CBC low but stable    Goal of Therapy:  INR 3-3.5   Plan:  Warfarin  5 mg PO x 1  Daily INR  Monitor for signs and symptoms of bleeding   Royetta Asal, PharmD, BCPS 03/03/2021 10:09 AM

## 2021-03-03 NOTE — Progress Notes (Signed)
   03/03/21 1200  Mobility  Activity Ambulated in hall  Level of Assistance Contact guard assist, steadying assist  Assistive Device None  Distance Ambulated (ft) 450 ft  Mobility Ambulated with assistance in hallway  Mobility Response Tolerated well  Mobility performed by Mobility specialist  $Mobility charge 1 Mobility   Pt agreeable to mobilize this morning. Ambulated about 444ft in hall, tolerated well. He stated his knee was "stiff" but no pain. Otherwise no complaints. Left pt in chair with call bell at side and wife present.   [place] Specialist Acute Rehab Services Office: [phone]

## 2021-03-03 NOTE — Telephone Encounter (Deleted)
Pt was discharged today from [place] to check on pt and schedule an appt to have INR checked next week at coumadin clinic. Pt's wife stated pt was very weak and didn't think he could walk that far. Pt's wife said she was going to call PCP office and request new orders for Home Health to come check INR like they were prior to hospitalization. I also called, Hinton Dyer with [facility] to confirm pt does not have active orders for Home Health and would need new orders from PCP for INR to be checked at home. Will wait for wife or [place] to call Coumadin Clinic to schedule an appt.

## 2021-03-03 NOTE — Discharge Summary (Signed)
Physician Discharge Summary   Patient name: Anthony Gould  Admit date:     02/26/2021  Discharge date: 03/03/2021  Discharge Physician: Dwyane Dee   PCP: Cassandria Anger, MD   Recommendations at discharge:  Follow up with Coumadin clinic (already contacted at time of note) Follow up with cardiology  Confirm patient completes fidaxomicin course as prescribed   Discharge Diagnoses Principal Problem:   Recurrent colitis due to Clostridium difficile Active Problems:   Persistent atrial fibrillation ([place])   H/O mitral valve replacement with mechanical valve   Hypokalemia   Biventricular heart failure ([place])   DM2 (diabetes mellitus, type 2) ([place])   Essential hypertension   Hypophosphatemia   Resolved Diagnoses Resolved Problems:   * No resolved hospital problems. [facility]   Anthony Gould is an 87 yo male with PMH severe MR s/p MVR on Coumadin, permanent afib, dCHF, pulm HTN, CKDIII, TIA who presented with multiple episodes of watery diarrhea at home and fever. He has had some recent exposure to healthcare (appendectomy followed by infected hematoma/abscess treated with antibiotics).  He then developed C. difficile colitis and was treated with oral vancomycin and Flagyl which was then transitioned to fidaxomicin due to interactions with Coumadin. After further discussion with patient and wife, there may have been some confusion at discharge and his fidaxomicin course might not have been completed. Thus, he again returned with recurrent diarrhea and was restarted on fidaxomicin. Repeat Cdiff testing was again positive on admission.  See below for further problem based plan.    * Recurrent colitis due to Clostridium difficile - Ongoing diarrhea with antigen positive, toxin negative, PCR positive -Continue fidaxomicin to complete course -Stools have been improving.  Patient denies significant abdominal pain - possible confusion with treatment instructions at  last discharge; he was again arranged to have fidaxomicin this discharge and was delivered to his house and he was given detailed instructions on AVS for remainder of course   Persistent atrial fibrillation ([place]) - On chronic Coumadin for history of mechanical mitral valve - Follows with cardiology - Continue bisoprolol  Biventricular heart failure ([place]) - Continue Entresto per cardiology - Continue bisoprolol -Possible outpatient coronary evaluation - he will follow up outpatient with cardiology at discharge   Hypokalemia - Repleted  H/O mitral valve replacement with mechanical valve - INR goal 2.5-3.5 - Patient endorses valve replacement approximately 37 years ago - Continue Coumadin  Essential hypertension - Continue current regimen  DM2 (diabetes mellitus, type 2) ([place]) - Last A1c 4.9% on 02/04/2021 - Continue diet control  Hypophosphatemia - Replete as needed     Condition at discharge: stable  Exam Physical Exam Constitutional:      General: He is not in acute distress.    Appearance: Normal appearance.  HENT:     Head: Normocephalic and atraumatic.     Mouth/Throat:     Mouth: Mucous membranes are moist.  Eyes:     Extraocular Movements: Extraocular movements intact.  Cardiovascular:     Rate and Rhythm: Normal rate.     Heart sounds: Normal heart sounds.     Comments: Mechanical click appreciated  Pulmonary:     Effort: Pulmonary effort is normal. No respiratory distress.     Breath sounds: Normal breath sounds. No wheezing.  Abdominal:     General: Bowel sounds are normal. There is no distension.     Palpations: Abdomen is soft.     Tenderness: There is no abdominal tenderness.  Musculoskeletal:        General: Normal range of motion.     Cervical back: Normal range of motion and neck supple.  Skin:    General: Skin is warm and dry.  Neurological:     General: No focal deficit present.     Mental Status: He is alert.  Psychiatric:        Mood  and Affect: Mood normal.        Behavior: Behavior normal.     Disposition: Home  Discharge time: greater than 30 minutes.  Follow-up Information     Plotnikov, Evie Lacks, MD Follow up.   Specialty: Internal Medicine Contact information: [place] [address] [phone]         [facility] Follow up.   Specialty: Home Health Services Why: They will contact you about setting up a time to come out Contact information: [address] [place]  [address] [phone]                 Allergies as of 03/03/2021       Reactions   Diltiazem Other (See Comments)   "Bradycardia," per pt; tolerates Amlodipine   Sulfa Antibiotics Rash, Other (See Comments)   Reaction not fully recalled    Sulfonamide Derivatives Rash, Other (See Comments)   Reaction not fully recalled         Medication List     STOP taking these medications    sodium chloride 1 g tablet       TAKE these medications    acetaminophen 500 MG tablet Commonly known as: TYLENOL Take 500 mg by mouth every 6 (six) hours as needed for fever.   allopurinol 300 MG tablet Commonly known as: ZYLOPRIM TAKE 1 TABLET BY MOUTH DAILY AS NEEDED. GENERIC EQUIVALENT FOR ZYLOPRIM What changed:  how much to take how to take this when to take this additional instructions   aspirin 81 MG EC tablet Take 81 mg by mouth daily.   atorvastatin 20 MG tablet Commonly known as: LIPITOR TAKE 1 TABLET BY MOUTH TWO TIMES A WEEK What changed:  how much to take how to take this when to take this additional instructions   bisoprolol 5 MG tablet Commonly known as: ZEBETA Take 1 tablet (5 mg total) by mouth daily. Please keep upcoming appt for future refills. Thank you What changed: when to take this   dipyridamole 50 MG tablet Commonly known as: PERSANTINE Take 1 tablet (50 mg total) by mouth 3 (three) times daily.   feeding supplement Liqd Take 237 mLs by mouth 2  (two) times daily between meals.   ferrous [id number] C-folic acid capsule Commonly known as: TRINSICON / FOLTRIN Take 1 capsule by mouth 2 (two) times daily after a meal. What changed: when to take this   fidaxomicin 200 MG Tabs tablet Commonly known as: DIFICID Take 1 tablet (200 mg total) by mouth See admin instructions. Take 1 tablet twice a day thru 12/10. On 12/12 start taking 1 tablet every other day until tablets are finished   furosemide 40 MG tablet Commonly known as: LASIX Take 1 tablet (40 mg total) by mouth daily. Start taking on: March 04, 2021 What changed: See the new instructions.   latanoprost 0.005 % ophthalmic solution Commonly known as: XALATAN Place 1 drop into both eyes at bedtime.   saccharomyces boulardii 250 MG capsule Commonly known as: FLORASTOR Take 1 capsule (250 mg total) by mouth  2 (two) times daily.   sacubitril-valsartan 24-26 MG Commonly known as: ENTRESTO Take 1 tablet by mouth 2 (two) times daily.   Vitamin D3 50 MCG (2000 UT) capsule TAKE 1 CAPSULE BY MOUTH EVERY DAY   warfarin 5 MG tablet Commonly known as: COUMADIN Take as directed. If you are unsure how to take this medication, talk to your nurse or doctor. Original instructions: TAKE 1 TABLET BY MOUTH AS DIRECTED BY THE COUMADIN CLINIC What changed: See the new instructions.        CT ABDOMEN PELVIS W CONTRAST  Result Date: 02/04/2021 CLINICAL DATA:  87 year old male with history of nonlocalized abdominal pain. Diarrhea for 1 week. Fever. History of C difficile infection. EXAM: CT ABDOMEN AND PELVIS WITH CONTRAST TECHNIQUE: Multidetector CT imaging of the abdomen and pelvis was performed using the standard protocol following bolus administration of intravenous contrast. CONTRAST:  81mL OMNIPAQUE IOHEXOL 350 MG/ML SOLN COMPARISON:  CT the abdomen and pelvis 01/23/2021. FINDINGS: Lower chest: Cardiomegaly with biatrial dilatation. Mechanical mitral valve. Moderate right  and small left pleural effusions lying dependently with associated areas of passive subsegmental atelectasis in the lower lobes of the lungs bilaterally. Hepatobiliary: Liver has a shrunken appearance and nodular contour, indicative of underlying cirrhosis. No discrete cystic or solid hepatic lesions. Small calcified granuloma in segment 8 of the liver incidentally noted. No intra or extrahepatic biliary ductal dilatation. Status post cholecystectomy. Pancreas: Diffuse pancreatic atrophy. No pancreatic mass. No pancreatic ductal dilatation. No pancreatic or peripancreatic fluid collections or inflammatory changes. Spleen: Unremarkable. Adrenals/Urinary Tract: Mild scarring in the upper pole of the right kidney. Left kidney and bilateral adrenal glands are otherwise normal in appearance. No hydroureteronephrosis. Urinary bladder is normal in appearance. Stomach/Bowel: The appearance of the stomach is normal. No pathologic dilatation of small bowel or colon. Diffuse thickening of the wall in the colon and rectum where there is also mucosal hyperenhancement and some associated hypervascularity in the corresponding mesocolon and meso rectum, indicative of proctocolitis. Air-fluid levels are present throughout the colon. Status post appendectomy. Vascular/Lymphatic: Aortic atherosclerosis, without evidence of aneurysm or dissection in the abdominal or pelvic vasculature. No lymphadenopathy noted in the abdomen or pelvis. Reproductive: Prostate gland and seminal vesicles are unremarkable in appearance. Other: Trace volume of fluid in the peritoneal cavity. Previously noted right lower quadrant pigtail drainage catheter has been removed. A trace amount of fluid tracks into the anterior abdominal wall at the site of the prior catheter placement, presumably along the catheter tract. Trace amount of extraluminal gas and fluid in the right lower quadrant measuring 3.1 x 1.0 cm at the site of the catheter placement (axial image  47 of series 2), presumably within the previously evacuated abscess cavity. No pneumoperitoneum. Musculoskeletal: There are no aggressive appearing lytic or blastic lesions noted in the visualized portions of the skeleton. IMPRESSION: 1. Persistent evidence of proctocolitis, compatible with known C difficile infection. 2. Status post removal of previously noted right lower quadrant pigtail drainage catheter, with small residual evacuated abscess cavity and small amount of fluid tracking to the skin surface presumably along the course of the previously removed catheter. Attention on follow-up studies is recommended to ensure complete resolution of these findings. 3. Morphologic changes in the liver indicative of underlying cirrhosis. 4. Severe diffuse pancreatic atrophy. 5. Cardiomegaly with biatrial dilatation. 6. Moderate right and small left pleural effusions lying dependently with areas of passive subsegmental atelectasis in the lower lobes of the lungs bilaterally. 7. Additional incidental findings, as above. Electronically  Signed   By: Vinnie Langton M.D.   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 EXAM: INJECTION OF PERCUTANEOUS DRAINAGE CATHETER UNDER FLUOROSCOPY MEDICATIONS: None ANESTHESIA/SEDATION: None CONTRAST:  8 mL Isovue 300 FLUOROSCOPY TIME:  18 seconds.  2.0 mGy. COMPLICATIONS: None immediate. PROCEDURE: The percutaneous drainage catheter was injected under fluoroscopy and fluoroscopic images saved. The drain was then cut and removed in its entirety. FINDINGS: Contrast injection demonstrates no significant abscess cavity with contrast filling the drain and immediate space conforming to the pigtail shape of the distal drain. Contrast refluxes  along the drainage catheter itself and exits the catheter exit site. No further demonstration of fistula to the adjacent small bowel. 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 No further abscess cavity. Given findings, the drainage catheter was removed after the drain injection procedure. Electronically Signed   By: Aletta Edouard M.D.   On: 02/02/2021 16:38   DG CHEST PORT 1 VIEW  Result Date: 02/11/2021 CLINICAL DATA:  Sepsis EXAM: PORTABLE CHEST 1 VIEW COMPARISON:  02/04/2021 FINDINGS: 0608 hours. The cardio pericardial silhouette is enlarged. There is pulmonary vascular congestion without overt pulmonary edema. Stable retrocardiac atelectasis or infiltrate. No substantial effusion. IMPRESSION: Stable exam. Electronically Signed   By: Misty Stanley M.D.   On: 02/11/2021 11:35   DG Chest Port 1 View  Result Date: 02/04/2021 CLINICAL DATA:  Possible sepsis. EXAM: PORTABLE CHEST 1 VIEW COMPARISON:  12/29/2020. FINDINGS: The heart is markedly enlarged and the prostatic cardiac valve is noted. There is atherosclerotic calcification of the aorta. The pulmonary vasculature is within normal limits. Mild airspace disease is noted at the lung bases bilaterally. There is blunting of the right costophrenic angle, possible small pleural effusion. No pneumothorax. No acute osseous abnormality. Sternotomy wires are present over the midline. IMPRESSION: 1. Cardiomegaly. 2. Mild atelectasis or infiltrate at the lung bases. 3. Mild blunting of the costophrenic angle, possible trace pleural effusion. Electronically Signed   By: Brett Fairy M.D.   On: 02/04/2021 02:58   ECHOCARDIOGRAM COMPLETE  Result Date: 02/27/2021    ECHOCARDIOGRAM REPORT   Patient Name:   SADAT SLIWA Date of Exam: 02/27/2021 Medical Rec #:  [id number]         Height:       69.0 in Accession #:    [id number]        Weight:       167.6 lb Date of Birth:  02-Apr-1933         BSA:          1.917 m  Patient Age:    64 years          BP:           124/56 mmHg Patient Gender: M                 HR:           69 bpm. Exam Location:  Inpatient Procedure: 2D Echo, 3D Echo, Cardiac Doppler and Color Doppler Indications:     I50.40* Unspecified combined systolic (congestive) and                  diastolic (congestive) heart failure  History:         Patient has  prior history of Echocardiogram examinations, most                  recent 03/09/2019. CHF, Abnormal ECG, Pulmonary HTN and TIA,                  Mitral Valve Disease, Arrythmias:Atrial Fibrillation and NSVT,                  Signs/Symptoms:Chest Pain and Syncope; Risk                  Factors:Hypertension and Diabetes. Mechanical mitral valve.                   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 Procedure Date: 39.  Sonographer:     Roseanna Rainbow RDCS Referring Phys:  [id number] [place] Diagnosing Phys: Oswaldo Milian MD  Sonographer Comments: Technically difficult study due to poor echo windows. IMPRESSIONS  1. Left ventricular ejection fraction, by estimation, is 40 to 45%. The left ventricle has mildly decreased function. The left ventricle demonstrates regional wall motion abnormalities (see scoring diagram/findings for description). Septal hypokinesis. There is moderate left ventricular hypertrophy. Left ventricular diastolic parameters are indeterminate.  2. Right ventricular systolic function is moderately reduced. The right ventricular size is moderately enlarged. There is moderately elevated pulmonary artery systolic pressure. The estimated right ventricular systolic pressure is 71.0 mmHg.  3. Left atrial size was severely dilated.  4. Right atrial size was severely dilated.  5. 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 Procedure Date: 1. Trivial mitral valve regurgitation.     MG 67mmHg at 63bpm, EOA 2.4 cm^2  6. The tricuspid valve  is abnormal. Tricuspid valve regurgitation is severe.  7. The aortic valve is tricuspid. Aortic valve regurgitation is trivial. Aortic valve sclerosis is present, with no evidence of aortic valve stenosis.  8. The inferior vena cava is dilated in size with <50% respiratory variability, suggesting right atrial pressure of 15 mmHg. FINDINGS  Left Ventricle: Left ventricular ejection fraction, by estimation, is 40 to 45%. The left ventricle has mildly decreased function. The left ventricle demonstrates regional wall motion abnormalities. The left ventricular internal cavity size was normal in size. There is moderate left ventricular hypertrophy. Left ventricular diastolic parameters are indeterminate.  LV Wall Scoring: The entire septum is hypokinetic. The entire anterior wall, entire lateral wall, entire inferior wall, and apex are normal. Right Ventricle: The right ventricular size is moderately enlarged. Right vetricular wall thickness was not well visualized. Right ventricular systolic function is moderately reduced. There is moderately elevated pulmonary artery systolic pressure. The tricuspid regurgitant velocity is 3.09 m/s, and with an assumed right atrial pressure of 15 mmHg, the estimated right ventricular systolic pressure is 62.6 mmHg. Left Atrium: Left atrial size was severely dilated. Right Atrium: Right atrial size was severely dilated. Pericardium: There is no evidence of pericardial effusion. Mitral Valve: The mitral valve has been repaired/replaced. Trivial mitral valve regurgitation. There is a unknown size Medtronic tilting disc valve present in the mitral position. Procedure Date: 51. MV peak gradient, 13.5 mmHg. The mean mitral valve gradient is 4.0 mmHg. Tricuspid Valve: The tricuspid valve is abnormal. Tricuspid valve regurgitation is severe. Aortic Valve: The aortic valve is tricuspid. Aortic valve regurgitation is trivial. Aortic valve sclerosis is present, with no  evidence of aortic valve  stenosis. Pulmonic Valve: The pulmonic valve was not well visualized. Pulmonic valve regurgitation is trivial. Aorta: The aortic root and ascending aorta are structurally normal, with no evidence of dilitation. Venous: The inferior vena cava is dilated in size with less than 50% respiratory variability, suggesting right atrial pressure of 15 mmHg. IAS/Shunts: The interatrial septum was not well visualized.  LEFT VENTRICLE PLAX 2D LVIDd:         5.10 cm      Diastology LVIDs:         4.00 cm      LV e' medial:    7.40 cm/s LV PW:         2.00 cm      LV E/e' medial:  22.2 LV IVS:        0.80 cm      LV e' lateral:   7.29 cm/s LVOT diam:     2.40 cm      LV E/e' lateral: 22.5 LV SV:         97 LV SV Index:   51 LVOT Area:     4.52 cm  LV Volumes (MOD) LV vol d, MOD A2C: 135.0 ml LV vol d, MOD A4C: 105.0 ml LV vol s, MOD A2C: 69.9 ml LV vol s, MOD A4C: 59.0 ml LV SV MOD A2C:     65.1 ml LV SV MOD A4C:     105.0 ml LV SV MOD BP:      53.6 ml RIGHT VENTRICLE            IVC RV S prime:     8.33 cm/s  IVC diam: 2.70 cm TAPSE (M-mode): 1.4 cm LEFT ATRIUM            Index        RIGHT ATRIUM           Index LA diam:      5.80 cm  3.03 cm/m   RA Area:     39.60 cm LA Vol (A2C): 72.1 ml  37.62 ml/m  RA Volume:   166.00 ml 86.62 ml/m LA Vol (A4C): 140.0 ml 73.05 ml/m  AORTIC VALVE             PULMONIC VALVE LVOT Vmax:   104.00 cm/s PR End Diast Vel: 2.22 msec LVOT Vmean:  67.300 cm/s LVOT VTI:    0.215 m  AORTA Ao Root diam: 3.60 cm Ao Asc diam:  3.50 cm MITRAL VALVE                TRICUSPID VALVE MV Area (PHT): 3.48 cm     TR Peak grad:   38.2 mmHg MV Area VTI:   2.61 cm     TR Vmax:        309.00 cm/s MV Peak grad:  13.5 mmHg MV Mean grad:  4.0 mmHg     SHUNTS MV Vmax:       1.84 m/s     Systemic VTI:  0.22 m MV Vmean:      88.9 cm/s    Systemic Diam: 2.40 cm MV Decel Time: 218 msec MV E velocity: 164.00 cm/s Oswaldo Milian MD Electronically signed by Oswaldo Milian MD Signature Date/Time:  02/27/2021/6:41:19 PM    Final (Updated)    IR Radiologist Eval & Mgmt  Result Date: 02/02/2021 Please refer to notes tab for details about interventional procedure. (Op Note)  Results for orders placed or performed during the hospital encounter  of 02/26/21  Resp Panel by RT-PCR (Flu A&B, Covid) Nasopharyngeal Swab     Status: None   Collection Time: 02/26/21 10:30 PM   Specimen: Nasopharyngeal Swab; Nasopharyngeal(NP) swabs in vial transport medium  Result Value Ref Range Status   SARS Coronavirus 2 by RT PCR NEGATIVE NEGATIVE Final    Comment: (NOTE) SARS-CoV-2 target nucleic acids are NOT DETECTED.  The SARS-CoV-2 RNA is generally detectable in upper respiratory specimens during the acute phase of infection. The lowest concentration of SARS-CoV-2 viral copies this assay can detect is 138 copies/mL. A negative result does not preclude SARS-Cov-2 infection and should not be used as the sole basis for treatment or other patient management decisions. A negative result may occur with  improper specimen collection/handling, submission of specimen other than nasopharyngeal swab, presence of viral mutation(s) within the areas targeted by this assay, and inadequate number of viral copies(<138 copies/mL). A negative result must be combined with clinical observations, patient history, and epidemiological information. The expected result is Negative.  Fact Sheet for Patients:  EntrepreneurPulse.com.au  Fact Sheet for Healthcare Providers:  IncredibleEmployment.be  This test is no t yet approved or cleared by the [place] FDA and  has been authorized for detection and/or diagnosis of SARS-CoV-2 by FDA under an Emergency Use Authorization (EUA). This EUA will remain  in effect (meaning this test can be used) for the duration of the COVID-19 declaration under Section 564(b)(1) of the Act, 21 U.S.C.section 360bbb-3(b)(1), unless the authorization is  terminated  or revoked sooner.       Influenza A by PCR NEGATIVE NEGATIVE Final   Influenza B by PCR NEGATIVE NEGATIVE Final    Comment: (NOTE) The Xpert Xpress SARS-CoV-2/FLU/RSV plus assay is intended as an aid in the diagnosis of influenza from Nasopharyngeal swab specimens and should not be used as a sole basis for treatment. Nasal washings and aspirates are unacceptable for Xpert Xpress SARS-CoV-2/FLU/RSV testing.  Fact Sheet for Patients: EntrepreneurPulse.com.au  Fact Sheet for Healthcare Providers: IncredibleEmployment.be  This test is not yet approved or cleared by the [place] FDA and has been authorized for detection and/or diagnosis of SARS-CoV-2 by FDA under an Emergency Use Authorization (EUA). This EUA will remain in effect (meaning this test can be used) for the duration of the COVID-19 declaration under Section 564(b)(1) of the Act, 21 U.S.C. section 360bbb-3(b)(1), unless the authorization is terminated or revoked.  Performed at [facility], [place] [address] [place], [place] [address]   C Difficile Quick Screen w PCR reflex     Status: None   Collection Time: 02/26/21 11:16 PM   Specimen: STOOL  Result Value Ref Range Status   C Diff antigen NEGATIVE NEGATIVE Final   C Diff toxin NEGATIVE NEGATIVE Final   C Diff interpretation No C. difficile detected.  Final    Comment: Performed at [facility], [place] [address], [place], [place] [address]  Gastrointestinal Panel by PCR , Stool     Status: None   Collection Time: 02/27/21  4:41 PM   Specimen: Stool  Result Value Ref Range Status   Campylobacter species NOT DETECTED NOT DETECTED Final   Plesimonas shigelloides NOT DETECTED NOT DETECTED Final   Salmonella species NOT DETECTED NOT DETECTED Final   Yersinia enterocolitica NOT DETECTED NOT DETECTED Final   Vibrio species NOT DETECTED NOT DETECTED Final   Vibrio cholerae NOT  DETECTED NOT DETECTED Final   Enteroaggregative E coli (EAEC) NOT DETECTED NOT DETECTED Final  Enteropathogenic E coli (EPEC) NOT DETECTED NOT DETECTED Final   Enterotoxigenic E coli (ETEC) NOT DETECTED NOT DETECTED Final   Shiga like toxin producing E coli (STEC) NOT DETECTED NOT DETECTED Final   Shigella/Enteroinvasive E coli (EIEC) NOT DETECTED NOT DETECTED Final   Cryptosporidium NOT DETECTED NOT DETECTED Final   Cyclospora cayetanensis NOT DETECTED NOT DETECTED Final   Entamoeba histolytica NOT DETECTED NOT DETECTED Final   Giardia lamblia NOT DETECTED NOT DETECTED Final   Adenovirus F40/41 NOT DETECTED NOT DETECTED Final   Astrovirus NOT DETECTED NOT DETECTED Final   Norovirus GI/GII NOT DETECTED NOT DETECTED Final   Rotavirus A NOT DETECTED NOT DETECTED Final   Sapovirus (I, II, IV, and V) NOT DETECTED NOT DETECTED Final    Comment: Performed at [facility], [address] [place], [place] [address]  C Difficile Quick Screen w PCR reflex     Status: Abnormal   Collection Time: 02/27/21  4:41 PM   Specimen: Stool  Result Value Ref Range Status   C Diff antigen POSITIVE (A) NEGATIVE Final   C Diff toxin NEGATIVE NEGATIVE Final   C Diff interpretation Results are indeterminate. See PCR results.  Final    Comment: Performed at [facility], [place] [address], [place], [place] [address]  C. Diff by PCR, Reflexed     Status: Abnormal   Collection Time: 02/27/21  4:41 PM  Result Value Ref Range Status   Toxigenic C. Difficile by PCR POSITIVE (A) NEGATIVE Final    Comment: Positive for toxigenic C. difficile with little to no toxin production. Only treat if clinical presentation suggests symptomatic illness. Performed at [facility], [place] [address] [place], [place] [address]     Signed:  Dwyane Dee MD.  [facility] 03/03/2021, 4:43 PM

## 2021-03-03 NOTE — Consult Note (Signed)
[facility] Inpatient Consult   03/03/2021  Anthony Gould Nov 16, 1933 [id number]  [place] [facility] CM)   Patient screened for less than 30 days readmission hospitalization and noted extreme high risk score for unplanned readmission. Assess for potential [facility] CM post hospital transition of care needs. Per review, primary provider office has embedded chronic care management team and is listed for transition of care.   Plan:  Continue to follow progress and disposition to assess for post hospital care management needs.    Of note, [facility] Care Management services does not replace or interfere with any services that are arranged by inpatient case management or social work.   Netta Cedars, MSN, RN [facility] [employer] [phone]  Toll free office [phone]

## 2021-03-03 NOTE — Plan of Care (Signed)

## 2021-03-03 NOTE — Telephone Encounter (Signed)
Pt was discharged today from [place] to check on pt and schedule an appt to have INR checked next week at coumadin clinic. Pt's wife stated pt was very weak and didn't think he could walk that far. Pt's wife said she was going to call PCP office and request new orders for Home Health to come check INR like they were prior to hospitalization. Lastly, pt and pt's wife were concerned because at discharge, he was told that his INR was low but to continue current Warfarin dose. INR was 2.5 on 03/03/21 prior to discharge. Instructed pt to take extra 0.5 tablet today and then resume normal dose of Warfarin (1 tablet daily except 1/2 tablet on Mondays and Fridays).   Called Dana with [facility] to confirm pt does not have active orders for Home Health and would need new orders from PCP for INR to be checked at home. Will wait for wife or [place] to call Coumadin Clinic to schedule an appt.

## 2021-03-06 ENCOUNTER — Telehealth: Payer: Self-pay | Admitting: Internal Medicine

## 2021-03-06 NOTE — Telephone Encounter (Signed)
I'm sorry! Use abx ointment, TELFA pad, Coban wrap on wound. OV if not well. Thx

## 2021-03-06 NOTE — Telephone Encounter (Signed)
Tried calling pt/wife there was no answer x's 10 rings. Called cell number there was no answer and can't lave msg due to vm not set-up.Marland KitchenJohny Chess

## 2021-03-06 NOTE — Telephone Encounter (Signed)
Patient's spouse [name] states patient fell and has tears is skin on back that is bleeding  Offered caller ov for the later part of week w/ another [facility] provider, caller declined  Caller stated she do not want to take patient to ed or uc,   Caller requesting a call back for advice of topical medication that can be used on patient's wounds

## 2021-03-07 ENCOUNTER — Other Ambulatory Visit: Payer: Self-pay

## 2021-03-07 DIAGNOSIS — I5033 Acute on chronic diastolic (congestive) heart failure: Secondary | ICD-10-CM

## 2021-03-07 NOTE — ED Provider Notes (Signed)
[facility] Provider Note   CSN: [id number] Arrival date & time: 02/26/21  2022     History Chief Complaint  Patient presents with   Diarrhea    Anthony Gould is a 87 y.o. male.  HPI     87 y.o. male with medical history significant for severe mitral regurgitation s/p MVR in 1986 on warfarin, permanent atrial fibrillation, diastolic CHF, pulmonary hypertension, hypertension, hyperlipidemia, type 2 diabetes, CKD stage III, TIA who presents with persistent diarrhea.  Patient had appendectomy earlier in the summer and has multiple complications since then including bout of C. difficile colitis.  Patient comes into the ER because of diarrhea that has been persistent over the past few days.  He was on Dificid during his admission earlier in the month, his diarrhea did improve but restarted again.  Now he is also having some weakness, nausea and reduced p.o. intake.  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   NSVT (nonsustained ventricular tachycardia)    Peripheral edema    venous insufficiency   Permanent atrial fibrillation (HCC)    Pulmonary hypertension (HCC)    Syncope    TIA (transient ischemic attack)    while on coumadin   Type II or unspecified type diabetes mellitus without mention of complication, not stated as uncontrolled    lifestyle mangement   Ventricular ectopy    symptomatic    Patient Active Problem List   Diagnosis Date Noted   Hypophosphatemia 03/01/2021   Diarrhea 02/28/2021   Biventricular heart failure ([place]) 02/28/2021   Recurrent colitis due to Clostridium difficile 02/28/2021   Acute on chronic diastolic CHF (congestive heart failure) ([place]) 02/27/2021   Persistent atrial fibrillation ([place])  02/27/2021   Sepsis ([place]) 02/04/2021   Appendiceal abscess 01/10/2021   Hyponatremia 12/30/2020   Hypokalemia 12/30/2020   Iron deficiency anemia 12/30/2020   Postoperative infected hematoma due to E. coli 12/29/2020   Supratherapeutic INR 12/29/2020   Anemia 12/28/2020   H/O mitral valve replacement with mechanical valve    Acute appendicitis 12/03/2020   Insomnia 09/27/2020   Grief at loss of child 09/01/2020   Right hip pain 08/10/2020   Elevated TSH 07/07/2020   CRI (chronic renal insufficiency), stage 3 (moderate) ([place]) 04/08/2020   Neoplasm of uncertain behavior of skin 10/01/2019   Hypomagnesemia 04/02/2019   Syncope 03/05/2019   Abdominal pain 01/20/2019   Vitamin D deficiency 11/28/2018   Low back pain 11/26/2018   Skin cancer 11/26/2018   Melanoma in situ ([place]) 08/26/2018   Foot lesion 04/23/2018   Hematuria 12/18/2017   Night sweats 12/18/2017   Chronic diastolic heart failure ([place]) 09/16/2016   Food allergy 02/13/2016   Cirrhosis of liver without ascites ([place]) 10/12/2015   Nausea with vomiting 10/12/2015   Emesis 09/20/2015   Aspiration pneumonia ([place]) 09/03/2015   Food poisoning 09/02/2015   Hematemesis 09/02/2015   Cough 09/02/2015   Flank pain, acute 05/02/2015   Actinic keratoses 10/20/2014   NSVT (nonsustained ventricular tachycardia) ([place]) 02/16/2013   Routine health maintenance 02/06/2012   Paroxysmal atrial fibrillation ([place]) 09/06/2011   Long term (current) use of anticoagulants 07/04/2010   Gout 06/27/2010   Mitral valve disorder 06/06/2010  Chest pain 02/28/2010   TOXIC LABYRINTHITIS 08/04/2009   Hearing loss 07/07/2009   DYSPEPSIA, CHRONIC 06/23/2007   DM2 (diabetes mellitus, type 2) ([place]) 02/08/2007   Hyperlipidemia 01/31/2007   Essential hypertension 01/31/2007   Coronary atherosclerosis 01/31/2007   PAF (paroxysmal atrial fibrillation) ([place]) 01/31/2007   Cerebral artery occlusion with cerebral infarction ([place]) 01/31/2007   Transient  cerebral ischemia 01/31/2007   MITRAL VALVE REPLACEMENT, HX OF 01/31/2007    Past Surgical History:  Procedure Laterality Date   CHOLECYSTECTOMY, LAPAROSCOPIC  2003   IR PATIENT EVAL TECH 0-60 MINS  01/09/2021   IR RADIOLOGIST EVAL & MGMT  01/19/2021   IR RADIOLOGIST EVAL & MGMT  02/02/2021   LAPAROSCOPIC APPENDECTOMY N/A 12/05/2020   Procedure: APPENDECTOMY LAPAROSCOPIC;  Surgeon: Stark Klein, MD;  Location: [facility];  Service: General;  Laterality: N/A;   MITRAL VALVE REPLACEMENT     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       Family History  Problem Relation Age of Onset   Coronary artery disease Father    Diabetes Other    Coronary artery disease Other    Prostate cancer Neg Hx    Colon cancer Neg Hx     Social History   Tobacco Use   Smoking status: Never   Smokeless tobacco: Never  Substance Use Topics   Alcohol use: No    Alcohol/week: 0.0 standard drinks   Drug use: No    Home Medications Prior to Admission medications   Medication Sig Start Date End Date Taking? Authorizing Provider  acetaminophen (TYLENOL) 500 MG tablet Take 500 mg by mouth every 6 (six) hours as needed for fever.   Yes [provider]  allopurinol (ZYLOPRIM) 300 MG tablet TAKE 1 TABLET BY MOUTH DAILY AS NEEDED. GENERIC EQUIVALENT FOR ZYLOPRIM Patient taking differently: Take 300 mg by mouth daily. 04/14/20  Yes Plotnikov, Evie Lacks, MD  aspirin 81 MG EC tablet Take 81 mg by mouth daily.   Yes [provider]  atorvastatin (LIPITOR) 20 MG tablet TAKE 1 TABLET BY MOUTH TWO TIMES A WEEK Patient taking differently: Take 20 mg by mouth 2 (two) times a week. on Monday and Friday night 04/14/20  Yes Plotnikov, Evie Lacks, MD  Cholecalciferol (VITAMIN D3) 50 MCG (2000 UT) capsule TAKE 1 CAPSULE BY MOUTH EVERY DAY Patient taking differently: Take 2,000 Units by mouth daily. 02/23/21  Yes Plotnikov, Evie Lacks, MD  dipyridamole (PERSANTINE) 50 MG tablet Take 1 tablet (50 mg total)  by mouth 3 (three) times daily. 08/08/20  Yes Burnell Blanks, MD  feeding supplement (ENSURE ENLIVE / ENSURE PLUS) LIQD Take 237 mLs by mouth 2 (two) times daily between meals. 01/03/21  Yes Lavina Hamman, MD  ferrous [id number] C-folic acid (TRINSICON / FOLTRIN) capsule Take 1 capsule by mouth 2 (two) times daily after a meal. Patient taking differently: Take 1 capsule by mouth daily. 12/18/20  Yes Lorella Nimrod, MD  latanoprost (XALATAN) 0.005 % ophthalmic solution Place 1 drop into both eyes at bedtime.   Yes [provider]  saccharomyces boulardii (FLORASTOR) 250 MG capsule Take 1 capsule (250 mg total) by mouth 2 (two) times daily. 02/12/21 05/13/21 Yes Kayleen Memos, DO  warfarin (COUMADIN) 5 MG tablet TAKE 1 TABLET BY MOUTH AS DIRECTED BY THE COUMADIN CLINIC Patient taking differently: Take 2.5-5 mg by mouth See admin instructions. Take 2.5mg  by mouth daily on Monday and Friday, then take 5mg  all other days or as directed by the  Coumadin clinic 06/27/20  Yes Burnell Blanks, MD  bisoprolol (ZEBETA) 5 MG tablet Take 1 tablet (5 mg total) by mouth daily. Please keep upcoming appt for future refills. Thank you Patient taking differently: Take 5 mg by mouth at bedtime. Please keep upcoming appt for future refills. Thank you 01/13/21   Burnell Blanks, MD  fidaxomicin (DIFICID) 200 MG TABS tablet Take 1 tablet (200 mg total) by mouth See admin instructions. Take 1 tablet twice a day thru 12/10. On 12/12 start taking 1 tablet every other day until tablets are finished 03/03/21   Dwyane Dee, MD  furosemide (LASIX) 40 MG tablet Take 1 tablet (40 mg total) by mouth daily. 03/04/21   Dwyane Dee, MD  sacubitril-valsartan (ENTRESTO) 24-26 MG Take 1 tablet by mouth 2 (two) times daily. 03/03/21   Dwyane Dee, MD    Allergies    Diltiazem, Sulfa antibiotics, and Sulfonamide derivatives  Review of Systems   Review of Systems  Constitutional:  Positive  for activity change and fatigue.  Respiratory:  Negative for shortness of breath.   Cardiovascular:  Negative for chest pain.  Gastrointestinal:  Positive for abdominal pain, diarrhea and nausea.  All other systems reviewed and are negative.  Physical Exam Updated Vital Signs BP (!) 122/58 (BP Location: Right Arm)    Pulse 65    Temp (!) 97.4 F (36.3 C) (Oral)    Resp 18    Ht 5\' 9"  (1.753 m)    Wt 79.4 kg    SpO2 97%    BMI 25.85 kg/m   Physical Exam Vitals and nursing note reviewed.  Constitutional:      Appearance: He is well-developed.  HENT:     Head: Atraumatic.     Mouth/Throat:     Mouth: Mucous membranes are dry.  Cardiovascular:     Rate and Rhythm: Normal rate.  Pulmonary:     Effort: Pulmonary effort is normal.  Musculoskeletal:     Cervical back: Neck supple.  Skin:    General: Skin is warm.  Neurological:     Mental Status: He is alert and oriented to person, place, and time.    ED Results / Procedures / Treatments   Labs (all labs ordered are listed, but only abnormal results are displayed) Labs Reviewed  C DIFFICILE QUICK SCREEN W PCR REFLEX   - Abnormal; Notable for the following components:      Result Value   C Diff antigen POSITIVE (*)    All other components within normal limits  CLOSTRIDIUM DIFFICILE BY PCR, REFLEXED - Abnormal; Notable for the following components:   Toxigenic C. Difficile by PCR POSITIVE (*)    All other components within normal limits  COMPREHENSIVE METABOLIC PANEL - Abnormal; Notable for the following components:   Sodium 133 (*)    Potassium 3.3 (*)    Chloride 97 (*)    Glucose, Bld 119 (*)    Calcium 8.1 (*)    Albumin 3.4 (*)    Total Bilirubin 1.3 (*)    All other components within normal limits  CBC - Abnormal; Notable for the following components:   RBC 3.33 (*)    Hemoglobin 10.4 (*)    HCT 32.3 (*)    RDW 17.4 (*)    All other components within normal limits  URINALYSIS, ROUTINE W REFLEX MICROSCOPIC -  Abnormal; Notable for the following components:   APPearance HAZY (*)    Ketones, ur 5 (*)    Protein,  ur 100 (*)    Bacteria, UA RARE (*)    All other components within normal limits  PROTIME-INR - Abnormal; Notable for the following components:   Prothrombin Time 37.4 (*)    INR 3.8 (*)    All other components within normal limits  PROTIME-INR - Abnormal; Notable for the following components:   Prothrombin Time 36.7 (*)    INR 3.7 (*)    All other components within normal limits  BASIC METABOLIC PANEL - Abnormal; Notable for the following components:   Sodium 131 (*)    Calcium 7.4 (*)    All other components within normal limits  PROTIME-INR - Abnormal; Notable for the following components:   Prothrombin Time 34.4 (*)    INR 3.4 (*)    All other components within normal limits  CBC - Abnormal; Notable for the following components:   WBC 3.4 (*)    RBC 2.97 (*)    Hemoglobin 9.1 (*)    HCT 28.6 (*)    RDW 17.3 (*)    Platelets 141 (*)    All other components within normal limits  PHOSPHORUS - Abnormal; Notable for the following components:   Phosphorus 2.2 (*)    All other components within normal limits  COMPREHENSIVE METABOLIC PANEL - Abnormal; Notable for the following components:   Sodium 132 (*)    Calcium 7.7 (*)    Total Protein 5.4 (*)    Albumin 2.5 (*)    All other components within normal limits  PROTIME-INR - Abnormal; Notable for the following components:   Prothrombin Time 37.4 (*)    INR 3.8 (*)    All other components within normal limits  BASIC METABOLIC PANEL - Abnormal; Notable for the following components:   Sodium 132 (*)    Glucose, Bld 105 (*)    Calcium 7.7 (*)    All other components within normal limits  CBC - Abnormal; Notable for the following components:   WBC 3.5 (*)    RBC 2.99 (*)    Hemoglobin 9.0 (*)    HCT 28.5 (*)    RDW 16.9 (*)    All other components within normal limits  PROTIME-INR - Abnormal; Notable for the following  components:   Prothrombin Time 34.1 (*)    INR 3.4 (*)    All other components within normal limits  BASIC METABOLIC PANEL - Abnormal; Notable for the following components:   Sodium 130 (*)    Potassium 3.1 (*)    Chloride 96 (*)    Calcium 7.5 (*)    All other components within normal limits  CBC WITH DIFFERENTIAL/PLATELET - Abnormal; Notable for the following components:   WBC 3.9 (*)    RBC 3.02 (*)    Hemoglobin 9.2 (*)    HCT 28.6 (*)    RDW 16.8 (*)    All other components within normal limits  MAGNESIUM - Abnormal; Notable for the following components:   Magnesium 1.5 (*)    All other components within normal limits  PROTIME-INR - Abnormal; Notable for the following components:   Prothrombin Time 26.9 (*)    INR 2.5 (*)    All other components within normal limits  BASIC METABOLIC PANEL - Abnormal; Notable for the following components:   Sodium 131 (*)    Chloride 96 (*)    Glucose, Bld 113 (*)    Calcium 8.5 (*)    All other components within normal limits  CBC WITH DIFFERENTIAL/PLATELET -  Abnormal; Notable for the following components:   RBC 3.82 (*)    Hemoglobin 11.4 (*)    HCT 36.5 (*)    RDW 16.7 (*)    All other components within normal limits  RESP PANEL BY RT-PCR (FLU A&B, COVID) [id number]  C DIFFICILE QUICK SCREEN W PCR REFLEX    GASTROINTESTINAL PANEL BY PCR, STOOL (REPLACES STOOL CULTURE)  LIPASE, BLOOD  MAGNESIUM  MAGNESIUM  MAGNESIUM  PHOSPHORUS  MAGNESIUM    EKG EKG Interpretation  Date/Time:  Sunday February 26 2021 22:41:57 EST Ventricular Rate:  91 PR Interval:    QRS Duration: 109 QT Interval:  371 QTC Calculation: 457 R Axis:   194 Text Interpretation: Atrial fibrillation Right axis deviation Low voltage, extremity and precordial leads Consider anterior infarct Nonspecific T abnormalities, lateral leads No acute changes Confirmed by Pihu Basil ([id number]) on 02/26/2021 10:58:59 PM  Radiology No results  found.  Procedures Procedures   Medications Ordered in ED Medications  potassium chloride SA (KLOR-CON M) CR tablet 40 mEq (40 mEq Oral Given 02/26/21 2240)  magnesium sulfate IVPB 2 g 50 mL (0 g Intravenous Stopped 02/26/21 2353)  lactated ringers bolus 500 mL (0 mLs Intravenous Stopped 02/26/21 2359)  ondansetron (ZOFRAN) injection 4 mg (4 mg Intravenous Given 02/26/21 2243)  potassium chloride 10 mEq in 100 mL IVPB (0 mEq Intravenous Stopped 02/27/21 0409)  magnesium sulfate IVPB 2 g 50 mL (0 g Intravenous Stopped 02/27/21 1445)  potassium chloride SA (KLOR-CON M) CR tablet 40 mEq (40 mEq Oral Given 02/27/21 1341)  warfarin (COUMADIN) tablet 5 mg (5 mg Oral Given 02/28/21 1624)  potassium PHOSPHATE 30 mmol in dextrose 5 % 500 mL infusion (0 mmol Intravenous Stopped 02/28/21 2120)  magnesium sulfate IVPB 2 g 50 mL (2 g Intravenous New Bag/Given 03/02/21 1200)  potassium chloride SA (KLOR-CON M) CR tablet 40 mEq (40 mEq Oral Given 03/02/21 1601)  warfarin (COUMADIN) tablet 5 mg (5 mg Oral Given 03/02/21 1627)    ED Course  I have reviewed the triage vital signs and the nursing notes.  Pertinent labs & imaging results that were available during my care of the patient were reviewed by me and considered in my medical decision making (see chart for details).    MDM Rules/Calculators/A&P                            87 year old male with multiple medical comorbidities and history of C. difficile colitis comes in with chief complaint of diarrhea.  His diarrhea had improved, but restarted.  He is also having no weakness.  He is noted to be dehydrated and dry.  Labs ordered, it reveals evidence of AKI.  White count is normal.  C. difficile is less likely, but still possible.  We will get GI pathogen studies sent.  Patient will need admission to the hospital given his weakness and dehydration leading to AKI.  Final Clinical Impression(s) / ED Diagnoses Final diagnoses:  Dehydration  Diarrhea of  presumed infectious origin  AKI (acute kidney injury) (HCC)    Rx / DC Orders ED Discharge Orders          Ordered    furosemide (LASIX) 40 MG tablet  Daily        03/03/21 1105    fidaxomicin (DIFICID) 200 MG TABS tablet  See admin instructions        12 /09/22 1105    sacubitril-valsartan (ENTRESTO) 24-26 MG  2  times daily        03/03/21 1105    Increase activity slowly        03/03/21 1105    Diet - low sodium heart healthy        03/03/21 1105             Varney Biles, MD 03/07/21 2146

## 2021-03-07 NOTE — Telephone Encounter (Signed)
Notified pt w/MD response.../lmb 

## 2021-03-08 ENCOUNTER — Ambulatory Visit (INDEPENDENT_AMBULATORY_CARE_PROVIDER_SITE_OTHER): Payer: Medicare Other | Admitting: *Deleted

## 2021-03-08 ENCOUNTER — Other Ambulatory Visit: Payer: Self-pay

## 2021-03-08 ENCOUNTER — Telehealth: Payer: Self-pay | Admitting: *Deleted

## 2021-03-08 DIAGNOSIS — I6350 Cerebral infarction due to unspecified occlusion or stenosis of unspecified cerebral artery: Secondary | ICD-10-CM

## 2021-03-08 DIAGNOSIS — I480 Paroxysmal atrial fibrillation: Secondary | ICD-10-CM | POA: Diagnosis not present

## 2021-03-08 DIAGNOSIS — G459 Transient cerebral ischemic attack, unspecified: Secondary | ICD-10-CM

## 2021-03-08 DIAGNOSIS — I059 Rheumatic mitral valve disease, unspecified: Secondary | ICD-10-CM | POA: Diagnosis not present

## 2021-03-08 DIAGNOSIS — Z7901 Long term (current) use of anticoagulants: Secondary | ICD-10-CM

## 2021-03-08 DIAGNOSIS — I4891 Unspecified atrial fibrillation: Secondary | ICD-10-CM | POA: Diagnosis not present

## 2021-03-08 DIAGNOSIS — Z9889 Other specified postprocedural states: Secondary | ICD-10-CM | POA: Diagnosis not present

## 2021-03-08 LAB — POCT INR: INR: 3.3 — AB (ref 2.0–3.0)

## 2021-03-08 NOTE — Patient Instructions (Signed)
Description   Continue taking Warfarin 1 tablet daily except 1/2 tablet on Mondays and Fridays. Recheck INR in 1 week by Home Health. Coumadin Clinic [phone]      [facility] [place]

## 2021-03-08 NOTE — Chronic Care Management (AMB) (Signed)
Chronic Care Management   Note  03/08/2021 Name: MONTFORD BARG MRN: [id number] DOB: 1934/01/10  AHMED INNISS is a 87 y.o. year old male who is a primary care patient of Plotnikov, Evie Lacks, MD. I reached out to [name] by phone today in response to a referral sent by Mr. Geraldine Tesar Gottschall's PCP.  Mr. Myhand was given information about Chronic Care Management services today including:  CCM service includes personalized support from designated clinical staff supervised by his physician, including individualized plan of care and coordination with other care providers 24/7 contact phone numbers for assistance for urgent and routine care needs. Service will only be billed when office clinical staff spend 20 minutes or more in a month to coordinate care. Only one practitioner may furnish and bill the service in a calendar month. The patient may stop CCM services at any time (effective at the end of the month) by phone call to the office staff. The patient is responsible for co-pay (up to 20% after annual deductible is met) if co-pay is required by the individual health plan.   Patient agreed to services and verbal consent obtained.   Follow up plan: Telephone appointment with care management team member scheduled for:03/16/21  [place] Management  Direct Dial: [phone]

## 2021-03-13 DIAGNOSIS — Z0000 Encounter for general adult medical examination without abnormal findings: Secondary | ICD-10-CM | POA: Diagnosis not present

## 2021-03-13 DIAGNOSIS — F4323 Adjustment disorder with mixed anxiety and depressed mood: Secondary | ICD-10-CM | POA: Diagnosis not present

## 2021-03-13 DIAGNOSIS — I10 Essential (primary) hypertension: Secondary | ICD-10-CM | POA: Diagnosis not present

## 2021-03-13 DIAGNOSIS — E785 Hyperlipidemia, unspecified: Secondary | ICD-10-CM | POA: Diagnosis not present

## 2021-03-15 ENCOUNTER — Telehealth: Payer: Self-pay | Admitting: Internal Medicine

## 2021-03-15 ENCOUNTER — Other Ambulatory Visit: Payer: Self-pay

## 2021-03-15 ENCOUNTER — Ambulatory Visit (INDEPENDENT_AMBULATORY_CARE_PROVIDER_SITE_OTHER): Payer: Medicare Other | Admitting: Internal Medicine

## 2021-03-15 ENCOUNTER — Encounter: Payer: Self-pay | Admitting: Internal Medicine

## 2021-03-15 DIAGNOSIS — A09 Infectious gastroenteritis and colitis, unspecified: Secondary | ICD-10-CM | POA: Diagnosis not present

## 2021-03-15 DIAGNOSIS — E46 Unspecified protein-calorie malnutrition: Secondary | ICD-10-CM | POA: Insufficient documentation

## 2021-03-15 DIAGNOSIS — A0472 Enterocolitis due to Clostridium difficile, not specified as recurrent: Secondary | ICD-10-CM

## 2021-03-15 DIAGNOSIS — N183 Chronic kidney disease, stage 3 unspecified: Secondary | ICD-10-CM

## 2021-03-15 DIAGNOSIS — E1159 Type 2 diabetes mellitus with other circulatory complications: Secondary | ICD-10-CM

## 2021-03-15 DIAGNOSIS — K7460 Unspecified cirrhosis of liver: Secondary | ICD-10-CM

## 2021-03-15 DIAGNOSIS — E441 Mild protein-calorie malnutrition: Secondary | ICD-10-CM

## 2021-03-15 DIAGNOSIS — K3533 Acute appendicitis with perforation and localized peritonitis, with abscess: Secondary | ICD-10-CM

## 2021-03-15 DIAGNOSIS — I5082 Biventricular heart failure: Secondary | ICD-10-CM | POA: Diagnosis not present

## 2021-03-15 DIAGNOSIS — W19XXXA Unspecified fall, initial encounter: Secondary | ICD-10-CM

## 2021-03-15 DIAGNOSIS — E559 Vitamin D deficiency, unspecified: Secondary | ICD-10-CM

## 2021-03-15 LAB — COMPREHENSIVE METABOLIC PANEL
ALT: 15 U/L (ref 0–53)
AST: 32 U/L (ref 0–37)
Albumin: 3.5 g/dL (ref 3.5–5.2)
Alkaline Phosphatase: 87 U/L (ref 39–117)
BUN: 17 mg/dL (ref 6–23)
CO2: 30 mEq/L (ref 19–32)
Calcium: 9.1 mg/dL (ref 8.4–10.5)
Chloride: 97 mEq/L (ref 96–112)
Creatinine, Ser: 0.76 mg/dL (ref 0.40–1.50)
GFR: 81.85 mL/min (ref >60.00)
Glucose, Bld: 108 mg/dL — ABNORMAL HIGH (ref 70–99)
Potassium: 3.5 mEq/L (ref 3.5–5.1)
Sodium: 133 mEq/L — ABNORMAL LOW (ref 135–145)
Total Bilirubin: 0.9 mg/dL (ref 0.2–1.2)
Total Protein: 6.7 g/dL (ref 6.0–8.3)

## 2021-03-15 LAB — CBC WITH DIFFERENTIAL/PLATELET
Basophils Absolute: 0 10*3/uL (ref 0.0–0.1)
Basophils Relative: 1.1 % (ref 0.0–3.0)
Eosinophils Absolute: 0.2 10*3/uL (ref 0.0–0.7)
Eosinophils Relative: 3.8 % (ref 0.0–5.0)
HCT: 33 % — ABNORMAL LOW (ref 39.0–52.0)
Hemoglobin: 10.5 g/dL — ABNORMAL LOW (ref 13.0–17.0)
Lymphocytes Relative: 23.7 % (ref 12.0–46.0)
Lymphs Abs: 1 10*3/uL (ref 0.7–4.0)
MCHC: 32 g/dL (ref 30.0–36.0)
MCV: 93.3 fl (ref 78.0–100.0)
Monocytes Absolute: 0.3 10*3/uL (ref 0.1–1.0)
Monocytes Relative: 8.2 % (ref 3.0–12.0)
Neutro Abs: 2.6 10*3/uL (ref 1.4–7.7)
Neutrophils Relative %: 63.2 % (ref 43.0–77.0)
Platelets: 250 10*3/uL (ref 150.0–400.0)
RBC: 3.53 Mil/uL — ABNORMAL LOW (ref 4.22–5.81)
RDW: 18.1 % — ABNORMAL HIGH (ref 11.5–15.5)
WBC: 4.2 10*3/uL (ref 4.0–10.5)

## 2021-03-15 MED ORDER — SACCHAROMYCES BOULARDII 250 MG PO CAPS: 250.0000 mg | ORAL_CAPSULE | Freq: Two times a day (BID) | ORAL | 0 refills | Status: AC

## 2021-03-15 NOTE — Progress Notes (Signed)
Subjective:  Patient ID: Anthony Gould, male    DOB: 1934-03-22  Age: 87 y.o. MRN: [id number]  CC: Follow-up (6 week f/u)   HPI DEMITRUS FRANCISCO presents for C dif - taking Dificid po - better. 4 hard stools a day F/u CHF Pt fell 1 wk ago  Per hx: " Admit date:     02/26/2021  Discharge date: 03/03/2021  Discharge Physician: Dwyane Dee    PCP: Cassandria Anger, MD    Recommendations at discharge:  Follow up with Coumadin clinic (already contacted at time of note) Follow up with cardiology  Confirm patient completes fidaxomicin course as prescribed    Discharge Diagnoses Principal Problem:   Recurrent colitis due to Clostridium difficile Active Problems:   Persistent atrial fibrillation ([place])   H/O mitral valve replacement with mechanical valve   Hypokalemia   Biventricular heart failure ([place])   DM2 (diabetes mellitus, type 2) ([place])   Essential hypertension   Hypophosphatemia     Resolved Diagnoses Resolved Problems:   * No resolved hospital problems. [facility]   Mr. Dhanani is an 87 yo male with PMH severe MR s/p MVR on Coumadin, permanent afib, dCHF, pulm HTN, CKDIII, TIA who presented with multiple episodes of watery diarrhea at home and fever. He has had some recent exposure to healthcare (appendectomy followed by infected hematoma/abscess treated with antibiotics).  He then developed C. difficile colitis and was treated with oral vancomycin and Flagyl which was then transitioned to fidaxomicin due to interactions with Coumadin. After further discussion with patient and wife, there may have been some confusion at discharge and his fidaxomicin course might not have been completed. Thus, he again returned with recurrent diarrhea and was restarted on fidaxomicin. Repeat Cdiff testing was again positive on admission.  See below for further problem based plan.      * Recurrent colitis due to Clostridium difficile - Ongoing diarrhea with  antigen positive, toxin negative, PCR positive -Continue fidaxomicin to complete course -Stools have been improving.  Patient denies significant abdominal pain - possible confusion with treatment instructions at last discharge; he was again arranged to have fidaxomicin this discharge and was delivered to his house and he was given detailed instructions on AVS for remainder of course    Persistent atrial fibrillation ([place]) - On chronic Coumadin for history of mechanical mitral valve - Follows with cardiology - Continue bisoprolol   Biventricular heart failure ([place]) - Continue Entresto per cardiology - Continue bisoprolol -Possible outpatient coronary evaluation - he will follow up outpatient with cardiology at discharge    Hypokalemia - Repleted   H/O mitral valve replacement with mechanical valve - INR goal 2.5-3.5 - Patient endorses valve replacement approximately 37 years ago - Continue Coumadin   Essential hypertension - Continue current regimen   DM2 (diabetes mellitus, type 2) ([place]) - Last A1c 4.9% on 02/04/2021 - Continue diet control   Hypophosphatemia - Replete as needed"      Outpatient Medications Prior to Visit  Medication Sig Dispense Refill   acetaminophen (TYLENOL) 500 MG tablet Take 500 mg by mouth every 6 (six) hours as needed for fever.     allopurinol (ZYLOPRIM) 300 MG tablet TAKE 1 TABLET BY MOUTH DAILY AS NEEDED. GENERIC EQUIVALENT FOR ZYLOPRIM (Patient taking differently: Take 300 mg by mouth daily.) 90 tablet 3   aspirin 81 MG EC tablet Take 81 mg by mouth daily.     atorvastatin (LIPITOR) 20 MG tablet  TAKE 1 TABLET BY MOUTH TWO TIMES A WEEK (Patient taking differently: Take 20 mg by mouth 2 (two) times a week. on Monday and Friday night) 26 tablet 3   bisoprolol (ZEBETA) 5 MG tablet Take 1 tablet (5 mg total) by mouth daily. Please keep upcoming appt for future refills. Thank you (Patient taking differently: Take 5 mg by mouth at bedtime. Please keep  upcoming appt for future refills. Thank you) 45 tablet 0   Cholecalciferol (VITAMIN D3) 50 MCG (2000 UT) capsule TAKE 1 CAPSULE BY MOUTH EVERY DAY (Patient taking differently: Take 2,000 Units by mouth daily.) 100 capsule 3   dipyridamole (PERSANTINE) 50 MG tablet Take 1 tablet (50 mg total) by mouth 3 (three) times daily. 270 tablet 1   feeding supplement (ENSURE ENLIVE / ENSURE PLUS) LIQD Take 237 mLs by mouth 2 (two) times daily between meals. 5000 mL 0   ferrous [id number] C-folic acid (TRINSICON / FOLTRIN) capsule Take 1 capsule by mouth 2 (two) times daily after a meal. (Patient taking differently: Take 1 capsule by mouth daily.) 60 capsule 1   furosemide (LASIX) 40 MG tablet Take 1 tablet (40 mg total) by mouth daily. 30 tablet 3   latanoprost (XALATAN) 0.005 % ophthalmic solution Place 1 drop into both eyes at bedtime.     sacubitril-valsartan (ENTRESTO) 24-26 MG Take 1 tablet by mouth 2 (two) times daily. 60 tablet 3   warfarin (COUMADIN) 5 MG tablet TAKE 1 TABLET BY MOUTH AS DIRECTED BY THE COUMADIN CLINIC (Patient taking differently: Take 2.5-5 mg by mouth See admin instructions. Take 2.5mg  by mouth daily on Monday and Friday, then take 5mg  all other days or as directed by the Coumadin clinic) 100 tablet 1   saccharomyces boulardii (FLORASTOR) 250 MG capsule Take 1 capsule (250 mg total) by mouth 2 (two) times daily. 180 capsule 0   fidaxomicin (DIFICID) 200 MG TABS tablet Take 1 tablet (200 mg total) by mouth See admin instructions. Take 1 tablet twice a day thru 12/10. On 12/12 start taking 1 tablet every other day until tablets are finished (Patient not taking: Reported on 03/15/2021) 20 tablet 0   No facility-administered medications prior to visit.    ROS: Review of Systems  Constitutional:  Positive for fatigue. Negative for appetite change and unexpected weight change.  HENT:  Negative for congestion, nosebleeds, sneezing, sore throat and trouble swallowing.   Eyes:   Negative for itching and visual disturbance.  Respiratory:  Negative for cough.   Cardiovascular:  Negative for chest pain, palpitations and leg swelling.  Gastrointestinal:  Negative for abdominal distention, blood in stool, diarrhea and nausea.  Genitourinary:  Negative for frequency and hematuria.  Musculoskeletal:  Positive for back pain and gait problem. Negative for joint swelling and neck pain.  Skin:  Negative for rash.  Neurological:  Positive for weakness. Negative for dizziness, tremors and speech difficulty.  Hematological:  Bruises/bleeds easily.  Psychiatric/Behavioral:  Negative for agitation, decreased concentration, dysphoric mood and sleep disturbance. The patient is not nervous/anxious.    Objective:  BP (!) 142/70 (BP Location: Left Arm)    Pulse 80    Temp (!) 97.5 F (36.4 C) (Oral)    Ht 5\' 9"  (1.753 m)    Wt 160 lb 6.4 oz (72.8 kg)    SpO2 98%    BMI 23.69 kg/m   BP Readings from Last 3 Encounters:  03/15/21 (!) 142/70  03/03/21 (!) 122/58  02/24/21 128/60    Wt Readings from  Last 3 Encounters:  03/15/21 160 lb 6.4 oz (72.8 kg)  03/03/21 175 lb 0.7 oz (79.4 kg)  02/24/21 173 lb 9.6 oz (78.7 kg)    Physical Exam Constitutional:      General: He is not in acute distress.    Appearance: He is well-developed. He is not toxic-appearing.     Comments: NAD  Eyes:     Conjunctiva/sclera: Conjunctivae normal.     Pupils: Pupils are equal, round, and reactive to light.  Neck:     Thyroid: No thyromegaly.     Vascular: No JVD.  Cardiovascular:     Rate and Rhythm: Normal rate and regular rhythm.     Heart sounds: Normal heart sounds. No murmur heard.   No friction rub. No gallop.  Pulmonary:     Effort: Pulmonary effort is normal. No respiratory distress.     Breath sounds: Normal breath sounds. No wheezing or rales.  Chest:     Chest wall: No tenderness.  Abdominal:     General: Bowel sounds are normal. There is no distension.     Palpations: Abdomen is  soft. There is no mass.     Tenderness: There is no abdominal tenderness. There is no guarding or rebound.  Musculoskeletal:        General: Tenderness present. Normal range of motion.     Cervical back: Normal range of motion.     Right lower leg: No edema.     Left lower leg: No edema.  Lymphadenopathy:     Cervical: No cervical adenopathy.  Skin:    General: Skin is warm and dry.     Findings: No rash.  Neurological:     Mental Status: He is alert and oriented to person, place, and time.     Cranial Nerves: No cranial nerve deficit.     Motor: Weakness present. No abnormal muscle tone.     Coordination: Coordination normal.     Gait: Gait abnormal.     Deep Tendon Reflexes: Reflexes are normal and symmetric.  Psychiatric:        Behavior: Behavior normal.        Thought Content: Thought content normal.        Judgment: Judgment normal.    Using a cane R post chest and L elbow abrasion Lab Results  Component Value Date   WBC 5.8 03/03/2021   HGB 11.4 (L) 03/03/2021   HCT 36.5 (L) 03/03/2021   PLT 272 03/03/2021   GLUCOSE 113 (H) 03/03/2021   CHOL 140 04/01/2020   TRIG 96.0 04/01/2020   HDL 58.80 04/01/2020   LDLCALC 62 04/01/2020   ALT 11 02/28/2021   AST 24 02/28/2021   NA 131 (L) 03/03/2021   K 4.7 03/03/2021   CL 96 (L) 03/03/2021   CREATININE 0.82 03/03/2021   BUN 13 03/03/2021   CO2 27 03/03/2021   TSH 6.199 (H) 02/04/2021   PSA 0.87 10/20/2014   INR 3.3 (A) 03/08/2021   HGBA1C 4.9 02/04/2021    ECHOCARDIOGRAM COMPLETE  Result Date: 02/27/2021    ECHOCARDIOGRAM REPORT   Patient Name:   ROMONE SHAFF Date of Exam: 02/27/2021 Medical Rec #:  [id number]         Height:       69.0 in Accession #:    [id number]        Weight:       167.6 lb Date of Birth:  [birth year]  BSA:          1.917 m Patient Age:    89 years          BP:           124/56 mmHg Patient Gender: M                 HR:           69 bpm. Exam Location:  Inpatient Procedure: 2D Echo, 3D  Echo, Cardiac Doppler and Color Doppler Indications:     I50.40* Unspecified combined systolic (congestive) and                  diastolic (congestive) heart failure  History:         Patient has prior history of Echocardiogram examinations, most                  recent 03/09/2019. CHF, Abnormal ECG, Pulmonary HTN and TIA,                  Mitral Valve Disease, Arrythmias:Atrial Fibrillation and NSVT,                  Signs/Symptoms:Chest Pain and Syncope; Risk                  Factors:Hypertension and Diabetes. Mechanical mitral valve.                   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 Procedure Date: 18.  Sonographer:     Roseanna Rainbow RDCS Referring Phys:  [id number] [place] Diagnosing Phys: Oswaldo Milian MD  Sonographer Comments: Technically difficult study due to poor echo windows. IMPRESSIONS  1. Left ventricular ejection fraction, by estimation, is 40 to 45%. The left ventricle has mildly decreased function. The left ventricle demonstrates regional wall motion abnormalities (see scoring diagram/findings for description). Septal hypokinesis. There is moderate left ventricular hypertrophy. Left ventricular diastolic parameters are indeterminate.  2. Right ventricular systolic function is moderately reduced. The right ventricular size is moderately enlarged. There is moderately elevated pulmonary artery systolic pressure. The estimated right ventricular systolic pressure is 45.4 mmHg.  3. Left atrial size was severely dilated.  4. Right atrial size was severely dilated.  5. 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 Procedure Date: 31. Trivial mitral valve regurgitation.     MG 45mmHg at 63bpm, EOA 2.4 cm^2  6. The tricuspid valve is abnormal. Tricuspid valve regurgitation is severe.  7. The aortic valve is tricuspid. Aortic valve regurgitation is trivial. Aortic valve sclerosis is present,  with no evidence of aortic valve stenosis.  8. The inferior vena cava is dilated in size with <50% respiratory variability, suggesting right atrial pressure of 15 mmHg. FINDINGS  Left Ventricle: Left ventricular ejection fraction, by estimation, is 40 to 45%. The left ventricle has mildly decreased function. The left ventricle demonstrates regional wall motion abnormalities. The left ventricular internal cavity size was normal in size. There is moderate left ventricular hypertrophy. Left ventricular diastolic parameters are indeterminate.  LV Wall Scoring: The entire septum is hypokinetic. The entire anterior wall, entire lateral wall, entire inferior wall, and apex are normal. Right Ventricle: The right ventricular size is moderately enlarged. Right vetricular wall thickness was not well visualized. Right ventricular systolic function is moderately  reduced. There is moderately elevated pulmonary artery systolic pressure. The tricuspid regurgitant velocity is 3.09 m/s, and with an assumed right atrial pressure of 15 mmHg, the estimated right ventricular systolic pressure is 16.1 mmHg. Left Atrium: Left atrial size was severely dilated. Right Atrium: Right atrial size was severely dilated. Pericardium: There is no evidence of pericardial effusion. Mitral Valve: The mitral valve has been repaired/replaced. Trivial mitral valve regurgitation. There is a unknown size Medtronic tilting disc valve present in the mitral position. Procedure Date: 84. MV peak gradient, 13.5 mmHg. The mean mitral valve gradient is 4.0 mmHg. Tricuspid Valve: The tricuspid valve is abnormal. Tricuspid valve regurgitation is severe. Aortic Valve: The aortic valve is tricuspid. Aortic valve regurgitation is trivial. Aortic valve sclerosis is present, with no evidence of aortic valve stenosis. Pulmonic Valve: The pulmonic valve was not well visualized. Pulmonic valve regurgitation is trivial. Aorta: The aortic root and ascending aorta are  structurally normal, with no evidence of dilitation. Venous: The inferior vena cava is dilated in size with less than 50% respiratory variability, suggesting right atrial pressure of 15 mmHg. IAS/Shunts: The interatrial septum was not well visualized.  LEFT VENTRICLE PLAX 2D LVIDd:         5.10 cm      Diastology LVIDs:         4.00 cm      LV e' medial:    7.40 cm/s LV PW:         2.00 cm      LV E/e' medial:  22.2 LV IVS:        0.80 cm      LV e' lateral:   7.29 cm/s LVOT diam:     2.40 cm      LV E/e' lateral: 22.5 LV SV:         97 LV SV Index:   51 LVOT Area:     4.52 cm  LV Volumes (MOD) LV vol d, MOD A2C: 135.0 ml LV vol d, MOD A4C: 105.0 ml LV vol s, MOD A2C: 69.9 ml LV vol s, MOD A4C: 59.0 ml LV SV MOD A2C:     65.1 ml LV SV MOD A4C:     105.0 ml LV SV MOD BP:      53.6 ml RIGHT VENTRICLE            IVC RV S prime:     8.33 cm/s  IVC diam: 2.70 cm TAPSE (M-mode): 1.4 cm LEFT ATRIUM            Index        RIGHT ATRIUM           Index LA diam:      5.80 cm  3.03 cm/m   RA Area:     39.60 cm LA Vol (A2C): 72.1 ml  37.62 ml/m  RA Volume:   166.00 ml 86.62 ml/m LA Vol (A4C): 140.0 ml 73.05 ml/m  AORTIC VALVE             PULMONIC VALVE LVOT Vmax:   104.00 cm/s PR End Diast Vel: 2.22 msec LVOT Vmean:  67.300 cm/s LVOT VTI:    0.215 m  AORTA Ao Root diam: 3.60 cm Ao Asc diam:  3.50 cm MITRAL VALVE                TRICUSPID VALVE MV Area (PHT): 3.48 cm     TR Peak grad:   38.2 mmHg MV Area VTI:   2.61  cm     TR Vmax:        309.00 cm/s MV Peak grad:  13.5 mmHg MV Mean grad:  4.0 mmHg     SHUNTS MV Vmax:       1.84 m/s     Systemic VTI:  0.22 m MV Vmean:      88.9 cm/s    Systemic Diam: 2.40 cm MV Decel Time: 218 msec MV E velocity: 164.00 cm/s Oswaldo Milian MD Electronically signed by Oswaldo Milian MD Signature Date/Time: 02/27/2021/6:41:19 PM    Final (Updated)     Assessment & Plan:   Problem List Items Addressed This Visit     Appendiceal abscess     C diff - taking Dificid po -  better. Drains are out      Biventricular heart failure ([place])    Cont on Entresto. Monitor GFR      Relevant Orders   CBC with Differential/Platelet   Comprehensive metabolic panel   C. difficile diarrhea    Fall C diff - taking Dificid po - better. C diff Florastor      Cirrhosis of liver without ascites ([facility])    Monitor LFTs      CRI (chronic renal insufficiency), stage 3 (moderate) ([facility])    Check GFR      Diarrhea     C diff - taking Dificid po - better. C diff      DM2 (diabetes mellitus, type 2) ([facility])    On diet      Relevant Orders   CBC with Differential/Platelet   Comprehensive metabolic panel   Fall    Home PT Fall prevention      Malnutrition ([place])    Post C diff and abd abscess Drink Ensure      Vitamin D deficiency    On Vit D         Meds ordered this encounter  Medications   saccharomyces boulardii (FLORASTOR) 250 MG capsule    Sig: Take 1 capsule (250 mg total) by mouth 2 (two) times daily.    Dispense:  180 capsule    Refill:  0      Follow-up: Return in about 4 weeks (around 04/12/2021) for a follow-up visit.  Walker Kehr, MD

## 2021-03-15 NOTE — Assessment & Plan Note (Signed)
On Vit D 

## 2021-03-15 NOTE — Assessment & Plan Note (Signed)
Home PT Fall prevention

## 2021-03-15 NOTE — Assessment & Plan Note (Signed)
Cont on Entresto. Monitor GFR

## 2021-03-15 NOTE — Assessment & Plan Note (Signed)
Post C diff and abd abscess Drink Ensure

## 2021-03-15 NOTE — Patient Instructions (Signed)
Anthony Gould - home elevators

## 2021-03-15 NOTE — Assessment & Plan Note (Signed)
Monitor LFTs 

## 2021-03-15 NOTE — Assessment & Plan Note (Signed)
Check GFR 

## 2021-03-15 NOTE — Assessment & Plan Note (Addendum)
C diff - taking Dificid po - better. C diff

## 2021-03-15 NOTE — Assessment & Plan Note (Signed)
  On diet  

## 2021-03-15 NOTE — Telephone Encounter (Signed)
Christina calling in from [employer] to check status of a Skilled nursing order for patient dated 11.29.22 (also faxed same day)  Please fu [phone]

## 2021-03-15 NOTE — Assessment & Plan Note (Signed)
C diff - taking Dificid po - better. Drains are out

## 2021-03-15 NOTE — Assessment & Plan Note (Addendum)
Fall C diff - taking Dificid po - better. C diff Florastor

## 2021-03-16 ENCOUNTER — Ambulatory Visit: Payer: Medicare Other

## 2021-03-16 ENCOUNTER — Ambulatory Visit (INDEPENDENT_AMBULATORY_CARE_PROVIDER_SITE_OTHER): Payer: Medicare Other | Admitting: *Deleted

## 2021-03-16 ENCOUNTER — Other Ambulatory Visit: Payer: Self-pay

## 2021-03-16 DIAGNOSIS — I4891 Unspecified atrial fibrillation: Secondary | ICD-10-CM | POA: Diagnosis not present

## 2021-03-16 DIAGNOSIS — I480 Paroxysmal atrial fibrillation: Secondary | ICD-10-CM

## 2021-03-16 DIAGNOSIS — Z7901 Long term (current) use of anticoagulants: Secondary | ICD-10-CM

## 2021-03-16 DIAGNOSIS — A0472 Enterocolitis due to Clostridium difficile, not specified as recurrent: Secondary | ICD-10-CM

## 2021-03-16 DIAGNOSIS — I059 Rheumatic mitral valve disease, unspecified: Secondary | ICD-10-CM

## 2021-03-16 DIAGNOSIS — Z5181 Encounter for therapeutic drug level monitoring: Secondary | ICD-10-CM | POA: Diagnosis not present

## 2021-03-16 DIAGNOSIS — I5032 Chronic diastolic (congestive) heart failure: Secondary | ICD-10-CM

## 2021-03-16 DIAGNOSIS — I10 Essential (primary) hypertension: Secondary | ICD-10-CM

## 2021-03-16 DIAGNOSIS — I6350 Cerebral infarction due to unspecified occlusion or stenosis of unspecified cerebral artery: Secondary | ICD-10-CM

## 2021-03-16 DIAGNOSIS — Z9889 Other specified postprocedural states: Secondary | ICD-10-CM

## 2021-03-16 DIAGNOSIS — G459 Transient cerebral ischemic attack, unspecified: Secondary | ICD-10-CM

## 2021-03-16 LAB — POCT INR: INR: 2 (ref 2.0–3.0)

## 2021-03-16 NOTE — Patient Instructions (Signed)
Description   Take 2 tablets today and 1 tablet tomorrow and then continue taking Warfarin 1 tablet daily except 1/2 tablet on Mondays and Fridays. Recheck INR in 1 week. Coumadin Clinic [phone]

## 2021-03-16 NOTE — Chronic Care Management (AMB) (Addendum)
Chronic Care Management   CCM RN Visit Note  03/16/2021 Name: Anthony Gould MRN: [id number] DOB: 29-Sep-1933  Subjective: Anthony Gould is a 87 y.o. year old male who is a primary care patient of Plotnikov, Evie Lacks, MD. The care management team was consulted for assistance with disease management and care coordination needs.    Engaged with patient and his spouse by telephone for initial visit in response to provider referral for case management and/or care coordination services.   Consent to Services:  The patient was given information about Chronic Care Management services, agreed to services, and gave verbal consent 03/08/21 prior to initiation of services.  Please see initial visit note for detailed documentation.  Patient agreed to services and verbal consent obtained.   Assessment: Review of patient past medical history, allergies, medications, health status, including review of consultants reports, laboratory and other test data, was performed as part of comprehensive evaluation and provision of chronic care management services.   SDOH (Social Determinants of Health) assessments and interventions performed:  SDOH Interventions    Flowsheet Row Most Recent Value  SDOH Interventions   Food Insecurity Interventions Intervention Not Indicated  Housing Interventions Intervention Not Indicated  [Single family home x 20 years,  2 story,  has lift going upstairs- uses,  owns home]  Transportation Interventions Intervention Not Indicated  [Wife currently providing transportation while patient recuperates from recent illnesses,  reports hopeful to eventually resume driving self]      CCM Care Plan Allergies  Allergen Reactions   Diltiazem Other (See Comments)    "Bradycardia," per pt; tolerates Amlodipine   Sulfa Antibiotics Rash and Other (See Comments)    Reaction not fully recalled    Sulfonamide Derivatives Rash and Other (See Comments)    Reaction not fully recalled     Outpatient Encounter Medications as of 03/16/2021  Medication Sig   acetaminophen (TYLENOL) 500 MG tablet Take 500 mg by mouth every 6 (six) hours as needed for fever.   allopurinol (ZYLOPRIM) 300 MG tablet TAKE 1 TABLET BY MOUTH DAILY AS NEEDED. GENERIC EQUIVALENT FOR ZYLOPRIM (Patient taking differently: Take 300 mg by mouth daily.)   aspirin 81 MG EC tablet Take 81 mg by mouth daily.   atorvastatin (LIPITOR) 20 MG tablet TAKE 1 TABLET BY MOUTH TWO TIMES A WEEK (Patient taking differently: Take 20 mg by mouth 2 (two) times a week. on Monday and Friday night)   bisoprolol (ZEBETA) 5 MG tablet Take 1 tablet (5 mg total) by mouth daily. Please keep upcoming appt for future refills. Thank you (Patient taking differently: Take 5 mg by mouth at bedtime. Please keep upcoming appt for future refills. Thank you)   Cholecalciferol (VITAMIN D3) 50 MCG (2000 UT) capsule TAKE 1 CAPSULE BY MOUTH EVERY DAY (Patient taking differently: Take 2,000 Units by mouth daily.)   dipyridamole (PERSANTINE) 50 MG tablet Take 1 tablet (50 mg total) by mouth 3 (three) times daily.   feeding supplement (ENSURE ENLIVE / ENSURE PLUS) LIQD Take 237 mLs by mouth 2 (two) times daily between meals.   ferrous [id number] C-folic acid (TRINSICON / FOLTRIN) capsule Take 1 capsule by mouth 2 (two) times daily after a meal. (Patient taking differently: Take 1 capsule by mouth daily.)   furosemide (LASIX) 40 MG tablet Take 1 tablet (40 mg total) by mouth daily.   latanoprost (XALATAN) 0.005 % ophthalmic solution Place 1 drop into both eyes at bedtime.   saccharomyces boulardii (FLORASTOR) 250 MG capsule Take 1  capsule (250 mg total) by mouth 2 (two) times daily.   sacubitril-valsartan (ENTRESTO) 24-26 MG Take 1 tablet by mouth 2 (two) times daily.   warfarin (COUMADIN) 5 MG tablet TAKE 1 TABLET BY MOUTH AS DIRECTED BY THE COUMADIN CLINIC (Patient taking differently: Take 2.5-5 mg by mouth See admin instructions. Take 2.5mg  by  mouth daily on Monday and Friday, then take 5mg  all other days or as directed by the Coumadin clinic)   No facility-administered encounter medications on file as of 03/16/2021.   Patient Active Problem List   Diagnosis Date Noted   C. difficile diarrhea 03/15/2021   Malnutrition ([place]) 03/15/2021   Fall 03/15/2021   Hypophosphatemia 03/01/2021   Diarrhea 02/28/2021   Biventricular heart failure ([place]) 02/28/2021   Recurrent colitis due to Clostridium difficile 02/28/2021   Acute on chronic diastolic CHF (congestive heart failure) ([place]) 02/27/2021   Persistent atrial fibrillation ([place]) 02/27/2021   Sepsis ([place]) 02/04/2021   Appendiceal abscess 01/10/2021   Hyponatremia 12/30/2020   Hypokalemia 12/30/2020   Iron deficiency anemia 12/30/2020   Postoperative infected hematoma due to E. coli 12/29/2020   Supratherapeutic INR 12/29/2020   Anemia 12/28/2020   H/O mitral valve replacement with mechanical valve    Acute appendicitis 12/03/2020   Insomnia 09/27/2020   Grief at loss of child 09/01/2020   Right hip pain 08/10/2020   Elevated TSH 07/07/2020   CRI (chronic renal insufficiency), stage 3 (moderate) ([place]) 04/08/2020   Neoplasm of uncertain behavior of skin 10/01/2019   Hypomagnesemia 04/02/2019   Syncope 03/05/2019   Abdominal pain 01/20/2019   Vitamin D deficiency 11/28/2018   Low back pain 11/26/2018   Skin cancer 11/26/2018   Melanoma in situ ([place]) 08/26/2018   Foot lesion 04/23/2018   Hematuria 12/18/2017   Night sweats 12/18/2017   Chronic diastolic heart failure ([place]) 09/16/2016   Food allergy 02/13/2016   Cirrhosis of liver without ascites ([place]) 10/12/2015   Nausea with vomiting 10/12/2015   Emesis 09/20/2015   Aspiration pneumonia ([place]) 09/03/2015   Food poisoning 09/02/2015   Hematemesis 09/02/2015   Cough 09/02/2015   Flank pain, acute 05/02/2015   Actinic keratoses 10/20/2014   NSVT (nonsustained ventricular tachycardia) ([place]) 02/16/2013   Routine health  maintenance 02/06/2012   Paroxysmal atrial fibrillation ([place]) 09/06/2011   Long term (current) use of anticoagulants 07/04/2010   Gout 06/27/2010   Mitral valve disorder 06/06/2010   Chest pain 02/28/2010   TOXIC LABYRINTHITIS 08/04/2009   Hearing loss 07/07/2009   DYSPEPSIA, CHRONIC 06/23/2007   DM2 (diabetes mellitus, type 2) ([place]) 02/08/2007   Hyperlipidemia 01/31/2007   Essential hypertension 01/31/2007   Coronary atherosclerosis 01/31/2007   PAF (paroxysmal atrial fibrillation) ([place]) 01/31/2007   Cerebral artery occlusion with cerebral infarction ([place]) 01/31/2007   Transient cerebral ischemia 01/31/2007   MITRAL VALVE REPLACEMENT, HX OF 01/31/2007   Conditions to be addressed/monitored:  CHF, HTN, and C-Difficile  Care Plan : RN Care Manager Plan of Care  Updates made by Knox Royalty, RN since 03/16/2021 12:00 AM     Problem: Chronic Disease Management Needs   Priority: High     Long-Range Goal: Development of plan of care for long term chronic disease management   Start Date: 03/16/2021  Expected End Date: 03/16/2022  Note:   Current Barriers:  Chronic Disease Management support and education needs related to CHF, HTN, and C-difficile Hard of Hearing Two recent hospitalizations: November 12-20, 2022: Sepsis secondary to C-Difficile December 4-9, 2022: Dehydration secondary to AKI/  ongoing C-Difficile  RNCM Clinical Goal(s):  Patient will demonstrate ongoing health management independence as evidenced by adherence to plan of care for CHF; HTN; C-Difficile        through collaboration with RN Care manager, provider, and care team.   Interventions: 1:1 collaboration with primary care provider regarding development and update of comprehensive plan of care as evidenced by provider attestation and co-signature Inter-disciplinary care team collaboration (see longitudinal plan of care) Evaluation of current treatment plan related to  self management and patient's  adherence to plan as established by provider Initial assessment completed Recent fall post-hospital discharge: fell while walking up first 4 steps of stairs at home, getting to stair lift which is installed in home:  reports he is planning to upgrade stair lift to include bottom steps "soon;" currently using cane and walker as needed; fall assessment completed and fall prevention education provided Pain assessment completed: patient denies acute/ chronic pain Confirms received flu vaccine for 2022-23 flu/ winter season "in November" at PCP office  Heart Failure Interventions:  (Status: New goal.)  Long Term Goal  Basic overview and discussion of pathophysiology of Heart Failure reviewed Provided education on low sodium diet Assessed need for readable accurate scales in home Discussed importance of daily weight and advised patient to weigh and record daily Reviewed role of diuretics in prevention of fluid overload and management of heart failure Discussed the importance of keeping all appointments with provider Screening for signs and symptoms of depression related to chronic disease state  Assessed social determinant of health barriers Confirms patient monitors/ writes down daily weights at home: reports very good baseline understanding of rationale/ purpose for daily weight monitoring, as well as weight gain guidelines to report weight gain > 3 lbs/ overnight and 5 lbs/ one week, to cardiology or PCP provider Discussed/ reinforced education around signs/ symptoms of CHF yellow zone: he denies clinical concerns today; reports minimal baseline shortness of breath intermittently with activity: reports "recovers completely with rest, within a very few minutes;" denies shortness of breath outside of baseline, post- recent hospital discharge; reports "no swelling this morning in ankles;" and acknowledges that he generally has slight swelling at end of day at baseline Confirmed patient monitors blood  pressures at home weekly- denies concerns around recent blood pressures post-hospital discharge; reports general ranges between 110-122/ 50-66, with a blood pressure today of 120/55  Recurrent C-Difficile requiring hospitalization  (Status: New goal.) Long Term Goal  Evaluation of current treatment plan related to  C-Difficile management ,  self-management and patient's adherence to plan as established by provider. Discussed plans with patient for ongoing care management follow up and provided patient with direct contact information for care management team Reviewed scheduled/upcoming provider appointments including 03/16/21- coumadin clinic; 04/17/21- cardiology/ ECHOcardiogram; 04/18/21- PCP follow up; Discussed plans with patient for ongoing care management follow up and provided patient with direct contact information for care management team; Confirmed patient independently self-manages medications; uses pill boxes; declines full medication review today; confirms he has no medications concerns and confirms currently taking pro-biotics and all other medications as prescribed; has not yet reviewed medication list from yesterday's PCP office visit: encouraged him to do, he verbalizes agreement Confirmed home health team active ([facility]); patient reports tolerating home PT "good;" states "helping;" process to have extension of services discussed with patient's spouse- should this be a need in the future Confirmed patient following BRAT diet post-recent hospital discharge Discussed with wife process to follow should diarrhea persist/ continue/ recur, with  or without other signs/ symptoms infection: call main number for PCP office immediately at first signs of symptoms to arrange/ schedule urgent provider office visit or speak with triage nurse; seek urgent/ emergent care if necessary: she verbalizes understanding/ agreement  Patient Goals/Self-Care Activities: As evidenced by review of EHR,  collaboration with care team, and patient reporting during CCM RN CM outreach, Patient Anthony Gould will: Take medications as prescribed Attend all scheduled provider appointments Call pharmacy for medication refills Call provider office for new concerns or questions Try to follow heart healthy, low salt, low cholesterol diet; the BRAT diet you described while you are recuperating from C-Difficile is fine Keep up the great work monitoring your daily weights at home- today, you reported a weight of 155.2 lbs Continue monitoring and writing down on paper your blood pressures weekly  Continue working with home health team as long as their services are in place; stay active, but don't over-do activity as you are recuperating Continue your efforts to prevent falls at home- use your cane and/ or walker as needed      Plan: Telephone follow up appointment with care management team member scheduled for:  Thursday, April 20, 2021 at 11:30 am The patient has been provided with contact information for the care management team and has been advised to call with any health related questions or concerns   Oneta Rack, RN, BSN, [place] [phone]: direct office   Medical screening examination/treatment/procedure(s) were performed by non-physician practitioner and as supervising physician I was immediately available for consultation/collaboration.  I agree with above. Lew Dawes, MD

## 2021-03-16 NOTE — Patient Instructions (Addendum)
Visit Information   Anthony Gould and [name], thank you for taking time to talk with me today. Please don't hesitate to contact me if I can be of assistance to you before our next scheduled telephone appointment.  Below are the goals we discussed today:  Patient Self-Care Activities: Patient Anthony Gould will: Take medications as prescribed Attend all scheduled provider appointments Call pharmacy for medication refills Call provider office for new concerns or questions Try to follow heart healthy, low salt, low cholesterol diet; the BRAT diet you described while you are recuperating from C-Difficile is fine Keep up the great work monitoring your daily weights at home- today, you reported a weight of 155.2 lbs Continue monitoring and writing down on paper your blood pressures weekly  Continue working with home health team as long as their services are in place; stay active, but don't over-do activity as you are recuperating Continue your efforts to prevent falls at home- use your cane and/ or walker as needed  Our next scheduled telephone follow up visit/ appointment with care management team member is scheduled on:  Thursday, April 20, 2021 at 11:30 am  If you need to cancel or re-schedule our visit, please call [phone] and our care guide team will be happy to assist you.   I look forward to hearing about your progress.   Oneta Rack, RN, BSN, [place] [phone]: direct office  If you are experiencing a Mental Health or [place] or need someone to talk to, please  call the Suicide and Crisis Lifeline: 988 call the [place] National Suicide Prevention Lifeline: 413 401 6268 or TTY: 408 365 3401 TTY 718-676-5673) to talk to a trained counselor call 1-800-273-TALK (toll free, 24 hour hotline) go to [facility] [place] [phone]) call 911   Preventing MDRO  Infections Multidrug-resistant organisms (MDRO) are bacteria that have become resistant to antibiotic medicines. This means that antibiotics cannot stop the bacteria from growing. Types of MDRO include: Methicillin/oxacillin-resistant Staphylococcus aureus (MRSA). Vancomycin-resistant enterococci (VRE). Extended-spectrum beta-lactamases (ESBLs). Clostridioides difficile (C. difficile or C. diff). Multidrug-resistant tuberculosis (MDR TB). Penicillin-resistant Streptococcus pneumoniae (PRSP). Carbapenem-resistant Enterobacterales (CRE). Drug-resistant Streptococcus pneumoniae (DRSP). How can this condition affect me? Everyone has good and bad bacteria in his or her body, such as in the stomach or on the skin. Good bacteria help protect the body from infection. However, when you take an antibiotic medicine, it may kill both the good and bad bacteria. This allows medicine-resistant bacteria to grow. Infections caused by MDRO can be difficult to treat. It is important to follow certain safety measures to prevent the spread of MDRO. What increases the risk? You are more likely to develop MDRO infections if: You were treated with an antibiotic medicine for a long time. You have been in the hospital for a long time. You recently had major surgery, such as chest or abdominal surgery. You have a weakened disease-fighting system (immune system). This may be caused by an illness, a long-term (chronic) condition, or a medical treatment. You have a catheter that has stayed in for a long time, such as a urinary catheter or vascular access device. MDRO are usually spread through hands that have the germs on them (are contaminated). MDRO may also be spread through: Medical equipment that was not cleaned properly. Shared personal items, such as razors or towels. Contaminated surfaces, such as a bathroom counter or sink. Undercooked or raw meat. Animals treated with antibiotics  may have medicine-resistant  bacteria. Water or vegetables contaminated with animal feces. How is this treated? MDRO infections are usually treated with antibiotic medicines. Treatment depends on the type of MDRO you have. MDRO infections are difficult to treat, and you may need to be hospitalized. Depending on how severe your infection is, you may need other treatments, such as: IV antibiotics. High-dose antibiotics. More than one antibiotic. Antibiotics that you breathe in (inhaled antibiotics), if you have pneumonia. A machine to help you breathe (ventilator). What actions can I take to prevent this? Medicines Take antibiotic medicines only when needed. Do not take antibiotic medicines for viral infections such as the common cold. If you were prescribed an antibiotic medicine, take it as told by your health care provider. Do not stop taking the antibiotic even if you start to feel better. Do not share antibiotic medicines with others. Food handling Practice safe food handling. This includes: Washing all fruits and vegetables. Washing all utensils that have come in contact with raw meat. Keeping a separate cutting board for raw meat. Cooking meat thoroughly. All poultry (including chicken and [place]) should be cooked to at least 165F (74C). Ground beef, pork, or lamb should be cooked to at least 160F (71C), and whole beef, pork, or lamb should be cooked to at least 145F (63C). Washing your hands with soap and warm water for at least 20 seconds before and after cooking, especially after handling raw meat. General information  Do not share personal items, such as bath towels or razors. Wash your hands regularly with soap and warm water for at least 20 seconds. If soap and water are not available, use hand sanitizer. Clean and disinfect surfaces that are touched often. Use solutions or products that contain bleach. Do this on a regular basis. Keep all wounds clean and dry. Follow your health care provider's  instructions about how to care for any wounds you have. If you have a catheter, care for it as told by your health care provider. What hospitals are doing: Encouraging staff, patients, and visitors to wash their hands often with soap and warm water and to use hand sanitizer when soap and water are not available. Taking extra steps to prevent infection (contact precautions) with patients who are infected with MDRO. Contact precautions include: Having all health care workers and visitors wash their hands before and after leaving the room. Having all health care workers and visitors wear a gown and gloves while in the room, and asking them to throw away the gown and gloves before leaving the room. Prescribing antibiotic medicines only when they are needed. Overprescribing antibiotic medicines can help the spread of MDRO. Keeping patients with MDRO in a room by themselves (isolation) or with other patients who are already infected with MDRO. Carefully cleaning and disinfecting hospital rooms and equipment. Improving communication about which patients are infected with MDRO or which patients have been infected in the past. Closely monitoring and tracking MDRO infections. Educating staff and patients about the signs of infection. Removing catheters or vascular access as soon as they are no longer necessary. What visitors can do: Although it is rare, visitors can be infected with MDRO. To prevent the spread, visitors should: Wash their hands with soap and warm water for at least 20 seconds before and after visiting you. If soap and water are not available, they can use hand sanitizer. Ask your health care provider if visitors need to wear gloves and gowns when they visit you. If they do  need to wear gloves and gowns, make sure they throw them away before leaving your room. Where to find more information Centers for Disease Control and Prevention: [employer]cy Summary Multidrug-resistant organisms (MDRO) are  bacteria that have become resistant to antibiotic medicines. You are more likely to be infected with a MDRO if you have been taking an antibiotic medicine for a long time or have been hospitalized for a long time. If you were prescribed an antibiotic medicine, take it exactly as told by your health care provider. Wash your hands regularly with soap and warm water for at least 20 seconds. If soap and water are not available, use hand sanitizer. Ask your health care provider whether your visitors need to wear gloves and gowns when visiting you. This information is not intended to replace advice given to you by your health care provider. Make sure you discuss any questions you have with your health care provider. Document Revised: 03/16/2020 Document Reviewed: 03/16/2020 Elsevier Patient Education  2022 [employer].   Following is a copy of your full care plan:  Care Plan : RN Care Manager Plan of Care  Updates made by Knox Royalty, RN since 03/16/2021 12:00 AM     Problem: Chronic Disease Management Needs   Priority: High     Long-Range Goal: Development of plan of care for long term chronic disease management   Start Date: 03/16/2021  Expected End Date: 03/16/2022  Note:   Current Barriers:  Chronic Disease Management support and education needs related to CHF, HTN, and C-difficile Hard of Hearing Two recent hospitalizations: November 12-20, 2022: Sepsis secondary to C-Difficile December 4-9, 2022: Dehydration secondary to AKI/ ongoing C-Difficile  RNCM Clinical Goal(s):  Patient will demonstrate ongoing health management independence as evidenced by adherence to plan of care for CHF; HTN; C-Difficile        through collaboration with RN Care manager, provider, and care team.   Interventions: 1:1 collaboration with primary care provider regarding development and update of comprehensive plan of care as evidenced by provider attestation and co-signature Inter-disciplinary care  team collaboration (see longitudinal plan of care) Evaluation of current treatment plan related to  self management and patient's adherence to plan as established by provider Initial assessment completed Recent fall post-hospital discharge: fell while walking up first 4 steps of stairs at home, getting to stair lift which is installed in home:  reports he is planning to upgrade stair lift to include bottom steps "soon;" currently using cane and walker as needed; fall assessment completed and fall prevention education provided Pain assessment completed: patient denies acute/ chronic pain Confirms received flu vaccine for 2022-23 flu/ winter season "in November" at PCP office  Heart Failure Interventions:  (Status: New goal.)  Long Term Goal  Basic overview and discussion of pathophysiology of Heart Failure reviewed Provided education on low sodium diet Assessed need for readable accurate scales in home Discussed importance of daily weight and advised patient to weigh and record daily Reviewed role of diuretics in prevention of fluid overload and management of heart failure Discussed the importance of keeping all appointments with provider Screening for signs and symptoms of depression related to chronic disease state  Assessed social determinant of health barriers Confirms patient monitors/ writes down daily weights at home: reports very good baseline understanding of rationale/ purpose for daily weight monitoring, as well as weight gain guidelines to report weight gain > 3 lbs/ overnight and 5 lbs/ one week, to cardiology or PCP provider Discussed/ reinforced education  around signs/ symptoms of CHF yellow zone: he denies clinical concerns today; reports minimal baseline shortness of breath intermittently with activity: reports "recovers completely with rest, within a very few minutes;" denies shortness of breath outside of baseline, post- recent hospital discharge; reports "no swelling this morning  in ankles;" and acknowledges that he generally has slight swelling at end of day at baseline Confirmed patient monitors blood pressures at home weekly- denies concerns around recent blood pressures post-hospital discharge; reports general ranges between 110-122/ 50-66, with a blood pressure today of 120/55  Recurrent C-Difficile requiring hospitalization  (Status: New goal.) Long Term Goal  Evaluation of current treatment plan related to  C-Difficile management ,  self-management and patient's adherence to plan as established by provider. Discussed plans with patient for ongoing care management follow up and provided patient with direct contact information for care management team Reviewed scheduled/upcoming provider appointments including 03/16/21- coumadin clinic; 04/17/21- cardiology/ ECHOcardiogram; 04/18/21- PCP follow up; Discussed plans with patient for ongoing care management follow up and provided patient with direct contact information for care management team; Confirmed patient independently self-manages medications; uses pill boxes; declines full medication review today; confirms he has no medications concerns and confirms currently taking pro-biotics and all other medications as prescribed; has not yet reviewed medication list from yesterday's PCP office visit: encouraged him to do, he verbalizes agreement Confirmed home health team active ([facility]); patient reports tolerating home PT "good;" states "helping;" process to have extension of services discussed with patient's spouse- should this be a need in the future Confirmed patient following BRAT diet post-recent hospital discharge Discussed with wife process to follow should diarrhea persist/ continue/ recur, with or without other signs/ symptoms infection: call main number for PCP office immediately at first signs of symptoms to arrange/ schedule urgent provider office visit or speak with triage nurse; seek urgent/ emergent care if  necessary: she verbalizes understanding/ agreement  Patient Goals/Self-Care Activities: As evidenced by review of EHR, collaboration with care team, and patient reporting during CCM RN CM outreach, Patient Anthony Gould will: Take medications as prescribed Attend all scheduled provider appointments Call pharmacy for medication refills Call provider office for new concerns or questions Try to follow heart healthy, low salt, low cholesterol diet; the BRAT diet you described while you are recuperating from C-Difficile is fine Keep up the great work monitoring your daily weights at home- today, you reported a weight of 155.2 lbs Continue monitoring and writing down on paper your blood pressures weekly  Continue working with home health team as long as their services are in place; stay active, but don't over-do activity as you are recuperating Continue your efforts to prevent falls at home- use your cane and/ or walker as needed      Consent to CCM Services: Mr. Christiano was given information 03/08/21 about Chronic Care Management services including:  CCM service includes personalized support from designated clinical staff supervised by his physician, including individualized plan of care and coordination with other care providers 24/7 contact phone numbers for assistance for urgent and routine care needs. Service will only be billed when office clinical staff spend 20 minutes or more in a month to coordinate care. Only one practitioner may furnish and bill the service in a calendar month. The patient may stop CCM services at any time (effective at the end of the month) by phone call to the office staff. The patient will be responsible for cost sharing (co-pay) of up to 20% of the service fee (after annual  deductible is met).  Patient agreed to services and verbal consent obtained.   Patient verbalizes understanding of instructions provided today and agrees to view in MyChart Telephone follow up  appointment with care management team member scheduled for:  Thursday, April 20, 2021 at 11:30 am The patient has been provided with contact information for the care management team and has been advised to call with any health related questions or concerns

## 2021-03-17 NOTE — Telephone Encounter (Signed)
Rec'd another form.. MD signed faxed back AGAIN!! we RECEIVED CONFIRMATION IT WAS RECEIVED.[name]/LMB

## 2021-03-21 ENCOUNTER — Other Ambulatory Visit: Payer: Self-pay

## 2021-03-21 ENCOUNTER — Other Ambulatory Visit: Payer: Self-pay | Admitting: *Deleted

## 2021-03-21 DIAGNOSIS — Z952 Presence of prosthetic heart valve: Secondary | ICD-10-CM

## 2021-03-21 DIAGNOSIS — I4821 Permanent atrial fibrillation: Secondary | ICD-10-CM

## 2021-03-21 DIAGNOSIS — I10 Essential (primary) hypertension: Secondary | ICD-10-CM

## 2021-03-21 MED ORDER — BISOPROLOL FUMARATE 5 MG PO TABS: 5.0000 mg | ORAL_TABLET | Freq: Every day | ORAL | 3 refills | Status: DC

## 2021-03-21 MED ORDER — DIPYRIDAMOLE 50 MG PO TABS: 50.0000 mg | ORAL_TABLET | Freq: Three times a day (TID) | ORAL | 3 refills | Status: DC

## 2021-03-21 NOTE — Telephone Encounter (Signed)
Pt's medications were sent to pt's pharmacy as requested. Confirmation received.  

## 2021-03-23 ENCOUNTER — Other Ambulatory Visit: Payer: Self-pay

## 2021-03-23 ENCOUNTER — Ambulatory Visit: Payer: Medicare Other

## 2021-03-23 DIAGNOSIS — I6350 Cerebral infarction due to unspecified occlusion or stenosis of unspecified cerebral artery: Secondary | ICD-10-CM | POA: Diagnosis not present

## 2021-03-23 DIAGNOSIS — Z7901 Long term (current) use of anticoagulants: Secondary | ICD-10-CM

## 2021-03-23 DIAGNOSIS — Z9889 Other specified postprocedural states: Secondary | ICD-10-CM

## 2021-03-23 DIAGNOSIS — I4891 Unspecified atrial fibrillation: Secondary | ICD-10-CM

## 2021-03-23 DIAGNOSIS — I059 Rheumatic mitral valve disease, unspecified: Secondary | ICD-10-CM | POA: Diagnosis not present

## 2021-03-23 LAB — POCT INR: INR: 1.9 — AB (ref 2.0–3.0)

## 2021-03-23 NOTE — Patient Instructions (Signed)
Description   Take 2 tablets today and then START taking 1 tablet daily except 1.5 every Wednesday.  Recheck INR in 1 week. Coumadin Clinic [phone]. Be consistent with Ensure (1 daily)

## 2021-03-24 ENCOUNTER — Telehealth: Payer: Self-pay

## 2021-03-24 DIAGNOSIS — I5032 Chronic diastolic (congestive) heart failure: Secondary | ICD-10-CM | POA: Diagnosis not present

## 2021-03-24 DIAGNOSIS — E1151 Type 2 diabetes mellitus with diabetic peripheral angiopathy without gangrene: Secondary | ICD-10-CM | POA: Diagnosis not present

## 2021-03-24 DIAGNOSIS — K219 Gastro-esophageal reflux disease without esophagitis: Secondary | ICD-10-CM | POA: Diagnosis not present

## 2021-03-24 DIAGNOSIS — E785 Hyperlipidemia, unspecified: Secondary | ICD-10-CM | POA: Diagnosis not present

## 2021-03-24 DIAGNOSIS — I130 Hypertensive heart and chronic kidney disease with heart failure and stage 1 through stage 4 chronic kidney disease, or unspecified chronic kidney disease: Secondary | ICD-10-CM | POA: Diagnosis not present

## 2021-03-24 DIAGNOSIS — N183 Chronic kidney disease, stage 3 unspecified: Secondary | ICD-10-CM | POA: Diagnosis not present

## 2021-03-24 DIAGNOSIS — K7460 Unspecified cirrhosis of liver: Secondary | ICD-10-CM | POA: Diagnosis not present

## 2021-03-24 DIAGNOSIS — A0472 Enterocolitis due to Clostridium difficile, not specified as recurrent: Secondary | ICD-10-CM | POA: Diagnosis not present

## 2021-03-24 DIAGNOSIS — I4819 Other persistent atrial fibrillation: Secondary | ICD-10-CM | POA: Diagnosis not present

## 2021-03-24 DIAGNOSIS — I272 Pulmonary hypertension, unspecified: Secondary | ICD-10-CM | POA: Diagnosis not present

## 2021-03-24 DIAGNOSIS — I872 Venous insufficiency (chronic) (peripheral): Secondary | ICD-10-CM | POA: Diagnosis not present

## 2021-03-24 DIAGNOSIS — E1122 Type 2 diabetes mellitus with diabetic chronic kidney disease: Secondary | ICD-10-CM | POA: Diagnosis not present

## 2021-03-24 DIAGNOSIS — E872 Acidosis, unspecified: Secondary | ICD-10-CM | POA: Diagnosis not present

## 2021-03-24 DIAGNOSIS — Z7982 Long term (current) use of aspirin: Secondary | ICD-10-CM | POA: Diagnosis not present

## 2021-03-24 DIAGNOSIS — M109 Gout, unspecified: Secondary | ICD-10-CM | POA: Diagnosis not present

## 2021-03-24 NOTE — Telephone Encounter (Signed)
Transition Care Management Unsuccessful Follow-up Telephone Call  Date of discharge and from where:  03/03/2021  [facility]  Attempts:  1st Attempt  Reason for unsuccessful TCM follow-up call:  No answer/busy Tomasa Rand, RN, BSN, CEN [place] Coordinator [phone]

## 2021-03-25 DIAGNOSIS — I5032 Chronic diastolic (congestive) heart failure: Secondary | ICD-10-CM

## 2021-03-25 DIAGNOSIS — I110 Hypertensive heart disease with heart failure: Secondary | ICD-10-CM

## 2021-03-29 ENCOUNTER — Telehealth: Payer: Self-pay

## 2021-03-29 NOTE — Telephone Encounter (Signed)
Transition Care Management Unsuccessful Follow-up Telephone Call  Date of discharge and from where:  03/03/21 from [facility]  Attempts:  2nd Attempt  Reason for unsuccessful TCM follow-up call:  Left voice message  Thea Silversmith, RN, MSN, BSN, CCM Care Management Coordinator [facility] [employer] [phone]

## 2021-03-31 ENCOUNTER — Other Ambulatory Visit: Payer: Self-pay

## 2021-03-31 ENCOUNTER — Ambulatory Visit (INDEPENDENT_AMBULATORY_CARE_PROVIDER_SITE_OTHER): Payer: Medicare Other | Admitting: *Deleted

## 2021-03-31 DIAGNOSIS — I4891 Unspecified atrial fibrillation: Secondary | ICD-10-CM

## 2021-03-31 DIAGNOSIS — Z5181 Encounter for therapeutic drug level monitoring: Secondary | ICD-10-CM

## 2021-03-31 DIAGNOSIS — Z7901 Long term (current) use of anticoagulants: Secondary | ICD-10-CM

## 2021-03-31 DIAGNOSIS — I6350 Cerebral infarction due to unspecified occlusion or stenosis of unspecified cerebral artery: Secondary | ICD-10-CM | POA: Diagnosis not present

## 2021-03-31 DIAGNOSIS — Z9889 Other specified postprocedural states: Secondary | ICD-10-CM | POA: Diagnosis not present

## 2021-03-31 DIAGNOSIS — I059 Rheumatic mitral valve disease, unspecified: Secondary | ICD-10-CM

## 2021-03-31 LAB — POCT INR: INR: 2.7 (ref 2.0–3.0)

## 2021-03-31 NOTE — Patient Instructions (Signed)
Description   Take 1.5 tablets of warfarin today  then START taking 1 tablet daily except 1.5 tablets on Sundays and Wednesdays.  Recheck INR in 1 week. Cou[facility]in Clinic [phone]. Be consistent with Ensure (1 daily)

## 2021-04-05 ENCOUNTER — Telehealth: Payer: Self-pay | Admitting: Internal Medicine

## 2021-04-05 NOTE — Telephone Encounter (Signed)
..  Caller name:Princess New [place]  On DPR? :yes/no: No  Call back number:  [phone]  Provider they see:   Plotnikov Reason for call:   Called from [facility].  Patient experienced a fall this week but with no complications and no injuries.  Patient is still on track to be discharged from home health PT next week.

## 2021-04-06 NOTE — Telephone Encounter (Signed)
Noted. Thanks.

## 2021-04-07 ENCOUNTER — Ambulatory Visit: Payer: Medicare Other

## 2021-04-07 ENCOUNTER — Other Ambulatory Visit: Payer: Self-pay

## 2021-04-07 DIAGNOSIS — Z7901 Long term (current) use of anticoagulants: Secondary | ICD-10-CM | POA: Diagnosis not present

## 2021-04-07 DIAGNOSIS — I480 Paroxysmal atrial fibrillation: Secondary | ICD-10-CM | POA: Diagnosis not present

## 2021-04-07 DIAGNOSIS — I6350 Cerebral infarction due to unspecified occlusion or stenosis of unspecified cerebral artery: Secondary | ICD-10-CM

## 2021-04-07 DIAGNOSIS — I059 Rheumatic mitral valve disease, unspecified: Secondary | ICD-10-CM | POA: Diagnosis not present

## 2021-04-07 DIAGNOSIS — I4891 Unspecified atrial fibrillation: Secondary | ICD-10-CM

## 2021-04-07 DIAGNOSIS — G459 Transient cerebral ischemic attack, unspecified: Secondary | ICD-10-CM

## 2021-04-07 DIAGNOSIS — Z9889 Other specified postprocedural states: Secondary | ICD-10-CM | POA: Diagnosis not present

## 2021-04-07 LAB — POCT INR: INR: 3.2 — AB (ref 2.0–3.0)

## 2021-04-07 NOTE — Patient Instructions (Signed)
-  continue taking 1 tablet daily except 1.5 tablets on Sundays and Wednesdays.   - Recheck INR in 2 weeks [facility] [phone]. Be consistent with Ensure (1 daily)

## 2021-04-17 ENCOUNTER — Ambulatory Visit (HOSPITAL_COMMUNITY): Payer: Medicare Other | Attending: Internal Medicine

## 2021-04-17 ENCOUNTER — Other Ambulatory Visit: Payer: Self-pay

## 2021-04-17 DIAGNOSIS — I4821 Permanent atrial fibrillation: Secondary | ICD-10-CM | POA: Insufficient documentation

## 2021-04-17 DIAGNOSIS — I059 Rheumatic mitral valve disease, unspecified: Secondary | ICD-10-CM | POA: Insufficient documentation

## 2021-04-17 DIAGNOSIS — I10 Essential (primary) hypertension: Secondary | ICD-10-CM | POA: Insufficient documentation

## 2021-04-17 DIAGNOSIS — I5033 Acute on chronic diastolic (congestive) heart failure: Secondary | ICD-10-CM | POA: Insufficient documentation

## 2021-04-17 LAB — ECHOCARDIOGRAM COMPLETE
Area-P 1/2: 1.69 cm2
MV VTI: 1.34 cm2
S' Lateral: 4.1 cm

## 2021-04-18 ENCOUNTER — Ambulatory Visit (INDEPENDENT_AMBULATORY_CARE_PROVIDER_SITE_OTHER): Payer: Medicare Other | Admitting: Internal Medicine

## 2021-04-18 ENCOUNTER — Encounter: Payer: Self-pay | Admitting: Internal Medicine

## 2021-04-18 DIAGNOSIS — I480 Paroxysmal atrial fibrillation: Secondary | ICD-10-CM | POA: Diagnosis not present

## 2021-04-18 DIAGNOSIS — A0472 Enterocolitis due to Clostridium difficile, not specified as recurrent: Secondary | ICD-10-CM | POA: Diagnosis not present

## 2021-04-18 DIAGNOSIS — I5082 Biventricular heart failure: Secondary | ICD-10-CM | POA: Diagnosis not present

## 2021-04-18 DIAGNOSIS — K7460 Unspecified cirrhosis of liver: Secondary | ICD-10-CM

## 2021-04-18 DIAGNOSIS — E1159 Type 2 diabetes mellitus with other circulatory complications: Secondary | ICD-10-CM

## 2021-04-18 LAB — COMPREHENSIVE METABOLIC PANEL
ALT: 18 U/L (ref 0–53)
AST: 34 U/L (ref 0–37)
Albumin: 3.8 g/dL (ref 3.5–5.2)
Alkaline Phosphatase: 114 U/L (ref 39–117)
BUN: 31 mg/dL — ABNORMAL HIGH (ref 6–23)
CO2: 27 mEq/L (ref 19–32)
Calcium: 9.3 mg/dL (ref 8.4–10.5)
Chloride: 103 mEq/L (ref 96–112)
Creatinine, Ser: 0.87 mg/dL (ref 0.40–1.50)
GFR: 78.53 mL/min (ref >60.00)
Glucose, Bld: 104 mg/dL — ABNORMAL HIGH (ref 70–99)
Potassium: 4.2 mEq/L (ref 3.5–5.1)
Sodium: 137 mEq/L (ref 135–145)
Total Bilirubin: 0.8 mg/dL (ref 0.2–1.2)
Total Protein: 7 g/dL (ref 6.0–8.3)

## 2021-04-18 MED ORDER — SACUBITRIL-VALSARTAN 24-26 MG PO TABS: 1.0000 | ORAL_TABLET | Freq: Two times a day (BID) | ORAL | 11 refills | Status: DC

## 2021-04-18 NOTE — Assessment & Plan Note (Signed)
1/23 ECHO: Left ventricular ejection fraction, by estimation, is 40 to 45%. Cont on Entresto. Monitor GFR

## 2021-04-18 NOTE — Progress Notes (Signed)
Subjective:  Patient ID: Anthony Gould, male    DOB: 08-15-1933  Age: 88 y.o. MRN: [id number]  CC: Follow-up (4 week f/u)   HPI Anthony Gould presents for C diff, anticoagulation, CRI, CHF f/u. No loose stools. Eating well...  Outpatient Medications Prior to Visit  Medication Sig Dispense Refill   acetaminophen (TYLENOL) 500 MG tablet Take 500 mg by mouth every 6 (six) hours as needed for fever.     allopurinol (ZYLOPRIM) 300 MG tablet TAKE 1 TABLET BY MOUTH DAILY AS NEEDED. GENERIC EQUIVALENT FOR ZYLOPRIM (Patient taking differently: Take 300 mg by mouth daily.) 90 tablet 3   aspirin 81 MG EC tablet Take 81 mg by mouth daily.     atorvastatin (LIPITOR) 20 MG tablet TAKE 1 TABLET BY MOUTH TWO TIMES A WEEK (Patient taking differently: Take 20 mg by mouth 2 (two) times a week. on Monday and Friday night) 26 tablet 3   bisoprolol (ZEBETA) 5 MG tablet Take 1 tablet (5 mg total) by mouth daily. 90 tablet 3   Cholecalciferol (VITAMIN D3) 50 MCG (2000 UT) capsule TAKE 1 CAPSULE BY MOUTH EVERY DAY (Patient taking differently: Take 2,000 Units by mouth daily.) 100 capsule 3   dipyridamole (PERSANTINE) 50 MG tablet Take 1 tablet (50 mg total) by mouth 3 (three) times daily. 270 tablet 3   feeding supplement (ENSURE ENLIVE / ENSURE PLUS) LIQD Take 237 mLs by mouth 2 (two) times daily between meals. 5000 mL 0   ferrous [id number] C-folic acid (TRINSICON / FOLTRIN) capsule Take 1 capsule by mouth 2 (two) times daily after a meal. (Patient taking differently: Take 1 capsule by mouth daily.) 60 capsule 1   furosemide (LASIX) 40 MG tablet Take 1 tablet (40 mg total) by mouth daily. 30 tablet 3   latanoprost (XALATAN) 0.005 % ophthalmic solution Place 1 drop into both eyes at bedtime.     saccharomyces boulardii (FLORASTOR) 250 MG capsule Take 1 capsule (250 mg total) by mouth 2 (two) times daily. 180 capsule 0   warfarin (COUMADIN) 5 MG tablet TAKE 1 TABLET BY MOUTH AS DIRECTED BY THE  COUMADIN CLINIC (Patient taking differently: Take 2.5-5 mg by mouth See admin instructions. Take 2.5mg  by mouth daily on Monday and Friday, then take 5mg  all other days or as directed by the Coumadin clinic) 100 tablet 1   sacubitril-valsartan (ENTRESTO) 24-26 MG Take 1 tablet by mouth 2 (two) times daily. 60 tablet 3   No facility-administered medications prior to visit.    ROS: Review of Systems  Constitutional:  Positive for fatigue and unexpected weight change. Negative for appetite change.  HENT:  Negative for congestion, nosebleeds, sneezing, sore throat and trouble swallowing.   Eyes:  Negative for itching and visual disturbance.  Respiratory:  Positive for shortness of breath. Negative for cough.   Cardiovascular:  Negative for chest pain, palpitations and leg swelling.  Gastrointestinal:  Negative for abdominal distention, blood in stool, diarrhea and nausea.  Genitourinary:  Negative for frequency and hematuria.  Musculoskeletal:  Positive for arthralgias and gait problem. Negative for back pain, joint swelling and neck pain.  Skin:  Negative for rash.  Neurological:  Negative for dizziness, tremors, speech difficulty and weakness.  Psychiatric/Behavioral:  Negative for agitation, dysphoric mood and sleep disturbance. The patient is not nervous/anxious.    Objective:  BP (!) 148/70 (BP Location: Left Arm)    Pulse 77    Temp 97.7 F (36.5 C) (Oral)    Ht 5'  9" (1.753 m)    Wt 154 lb 12.8 oz (70.2 kg)    SpO2 97%    BMI 22.86 kg/m   BP Readings from Last 3 Encounters:  04/18/21 (!) 148/70  03/15/21 (!) 142/70  03/03/21 (!) 122/58    Wt Readings from Last 3 Encounters:  04/18/21 154 lb 12.8 oz (70.2 kg)  03/15/21 160 lb 6.4 oz (72.8 kg)  03/03/21 175 lb 0.7 oz (79.4 kg)    Physical Exam Constitutional:      General: He is not in acute distress.    Appearance: He is well-developed.     Comments: NAD  Eyes:     Conjunctiva/sclera: Conjunctivae normal.     Pupils:  Pupils are equal, round, and reactive to light.  Neck:     Thyroid: No thyromegaly.     Vascular: No JVD.  Cardiovascular:     Rate and Rhythm: Normal rate. Rhythm irregular.     Heart sounds: Normal heart sounds. No murmur heard.   No friction rub. No gallop.  Pulmonary:     Effort: Pulmonary effort is normal. No respiratory distress.     Breath sounds: Normal breath sounds. No wheezing or rales.  Chest:     Chest wall: No tenderness.  Abdominal:     General: Bowel sounds are normal. There is no distension.     Palpations: Abdomen is soft. There is no mass.     Tenderness: There is no abdominal tenderness. There is no guarding or rebound.  Musculoskeletal:        General: No tenderness. Normal range of motion.     Cervical back: Normal range of motion.  Lymphadenopathy:     Cervical: No cervical adenopathy.  Skin:    General: Skin is warm and dry.     Findings: No rash.  Neurological:     Mental Status: He is alert and oriented to person, place, and time.     Cranial Nerves: No cranial nerve deficit.     Motor: No abnormal muscle tone.     Coordination: Coordination normal.     Gait: Gait normal.     Deep Tendon Reflexes: Reflexes are normal and symmetric.  Psychiatric:        Behavior: Behavior normal.        Thought Content: Thought content normal.        Judgment: Judgment normal.  Thin Using a cane  Lab Results  Component Value Date   WBC 4.2 03/15/2021   HGB 10.5 (L) 03/15/2021   HCT 33.0 (L) 03/15/2021   PLT 250.0 03/15/2021   GLUCOSE 108 (H) 03/15/2021   CHOL 140 04/01/2020   TRIG 96.0 04/01/2020   HDL 58.80 04/01/2020   LDLCALC 62 04/01/2020   ALT 15 03/15/2021   AST 32 03/15/2021   NA 133 (L) 03/15/2021   K 3.5 03/15/2021   CL 97 03/15/2021   CREATININE 0.76 03/15/2021   BUN 17 03/15/2021   CO2 30 03/15/2021   TSH 6.199 (H) 02/04/2021   PSA 0.87 10/20/2014   INR 3.2 (A) 04/07/2021   HGBA1C 4.9 02/04/2021    ECHOCARDIOGRAM COMPLETE  Result  Date: 02/27/2021    ECHOCARDIOGRAM REPORT   Patient Name:   Anthony Gould Date of Exam: 02/27/2021 Medical Rec #:  [id number]         Height:       69.0 in Accession #:    [id number]        Weight:  167.6 lb Date of Birth:  01/07/1934         BSA:          1.917 m Patient Age:    8 years          BP:           124/56 mmHg Patient Gender: M                 HR:           69 bpm. Exam Location:  Inpatient Procedure: 2D Echo, 3D Echo, Cardiac Doppler and Color Doppler Indications:     I50.40* Unspecified combined systolic (congestive) and                  diastolic (congestive) heart failure  History:         Patient has prior history of Echocardiogram examinations, most                  recent 03/09/2019. CHF, Abnormal ECG, Pulmonary HTN and TIA,                  Mitral Valve Disease, Arrythmias:Atrial Fibrillation and NSVT,                  Signs/Symptoms:Chest Pain and Syncope; Risk                  Factors:Hypertension and Diabetes. Mechanical mitral valve.                   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 Procedure Date: 64.  Sonographer:     Roseanna Rainbow RDCS Referring Phys:  [id number] [place] Diagnosing Phys: Oswaldo Milian MD  Sonographer Comments: Technically difficult study due to poor echo windows. IMPRESSIONS  1. Left ventricular ejection fraction, by estimation, is 40 to 45%. The left ventricle has mildly decreased function. The left ventricle demonstrates regional wall motion abnormalities (see scoring diagram/findings for description). Septal hypokinesis. There is moderate left ventricular hypertrophy. Left ventricular diastolic parameters are indeterminate.  2. Right ventricular systolic function is moderately reduced. The right ventricular size is moderately enlarged. There is moderately elevated pulmonary artery systolic pressure. The estimated right ventricular systolic pressure is 67.2 mmHg.  3. Left  atrial size was severely dilated.  4. Right atrial size was severely dilated.  5. 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 Procedure Date: 21. Trivial mitral valve regurgitation.     MG 32mmHg at 63bpm, EOA 2.4 cm^2  6. The tricuspid valve is abnormal. Tricuspid valve regurgitation is severe.  7. The aortic valve is tricuspid. Aortic valve regurgitation is trivial. Aortic valve sclerosis is present, with no evidence of aortic valve stenosis.  8. The inferior vena cava is dilated in size with <50% respiratory variability, suggesting right atrial pressure of 15 mmHg. FINDINGS  Left Ventricle: Left ventricular ejection fraction, by estimation, is 40 to 45%. The left ventricle has mildly decreased function. The left ventricle demonstrates regional wall motion abnormalities. The left ventricular internal cavity size was normal in size. There is moderate left ventricular hypertrophy. Left ventricular diastolic parameters are indeterminate.  LV Wall Scoring: The entire septum is hypokinetic. The entire anterior wall, entire lateral wall, entire inferior wall, and apex are normal. Right Ventricle: The right ventricular size is moderately enlarged.  Right vetricular wall thickness was not well visualized. Right ventricular systolic function is moderately reduced. There is moderately elevated pulmonary artery systolic pressure. The tricuspid regurgitant velocity is 3.09 m/s, and with an assumed right atrial pressure of 15 mmHg, the estimated right ventricular systolic pressure is 40.9 mmHg. Left Atrium: Left atrial size was severely dilated. Right Atrium: Right atrial size was severely dilated. Pericardium: There is no evidence of pericardial effusion. Mitral Valve: The mitral valve has been repaired/replaced. Trivial mitral valve regurgitation. There is a unknown size Medtronic tilting disc valve present in the mitral position. Procedure Date: 42. MV peak gradient, 13.5 mmHg. The mean mitral  valve gradient is 4.0 mmHg. Tricuspid Valve: The tricuspid valve is abnormal. Tricuspid valve regurgitation is severe. Aortic Valve: The aortic valve is tricuspid. Aortic valve regurgitation is trivial. Aortic valve sclerosis is present, with no evidence of aortic valve stenosis. Pulmonic Valve: The pulmonic valve was not well visualized. Pulmonic valve regurgitation is trivial. Aorta: The aortic root and ascending aorta are structurally normal, with no evidence of dilitation. Venous: The inferior vena cava is dilated in size with less than 50% respiratory variability, suggesting right atrial pressure of 15 mmHg. IAS/Shunts: The interatrial septum was not well visualized.  LEFT VENTRICLE PLAX 2D LVIDd:         5.10 cm      Diastology LVIDs:         4.00 cm      LV e' medial:    7.40 cm/s LV PW:         2.00 cm      LV E/e' medial:  22.2 LV IVS:        0.80 cm      LV e' lateral:   7.29 cm/s LVOT diam:     2.40 cm      LV E/e' lateral: 22.5 LV SV:         97 LV SV Index:   51 LVOT Area:     4.52 cm  LV Volumes (MOD) LV vol d, MOD A2C: 135.0 ml LV vol d, MOD A4C: 105.0 ml LV vol s, MOD A2C: 69.9 ml LV vol s, MOD A4C: 59.0 ml LV SV MOD A2C:     65.1 ml LV SV MOD A4C:     105.0 ml LV SV MOD BP:      53.6 ml RIGHT VENTRICLE            IVC RV S prime:     8.33 cm/s  IVC diam: 2.70 cm TAPSE (M-mode): 1.4 cm LEFT ATRIUM            Index        RIGHT ATRIUM           Index LA diam:      5.80 cm  3.03 cm/m   RA Area:     39.60 cm LA Vol (A2C): 72.1 ml  37.62 ml/m  RA Volume:   166.00 ml 86.62 ml/m LA Vol (A4C): 140.0 ml 73.05 ml/m  AORTIC VALVE             PULMONIC VALVE LVOT Vmax:   104.00 cm/s PR End Diast Vel: 2.22 msec LVOT Vmean:  67.300 cm/s LVOT VTI:    0.215 m  AORTA Ao Root diam: 3.60 cm Ao Asc diam:  3.50 cm MITRAL VALVE                TRICUSPID VALVE MV Area (PHT): 3.48 cm  TR Peak grad:   38.2 mmHg MV Area VTI:   2.61 cm     TR Vmax:        309.00 cm/s MV Peak grad:  13.5 mmHg MV Mean grad:  4.0 mmHg      SHUNTS MV Vmax:       1.84 m/s     Systemic VTI:  0.22 m MV Vmean:      88.9 cm/s    Systemic Diam: 2.40 cm MV Decel Time: 218 msec MV E velocity: 164.00 cm/s Oswaldo Milian MD Electronically signed by Oswaldo Milian MD Signature Date/Time: 02/27/2021/6:41:19 PM    Final (Updated)     Assessment & Plan:   Problem List Items Addressed This Visit     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 Cont on Entresto. Monitor GFR      Relevant Medications   sacubitril-valsartan (ENTRESTO) 24-26 MG   Other Relevant Orders   Comprehensive metabolic panel   C. difficile diarrhea    Doing well      Cirrhosis of liver without ascites (HCC)    Off alcohol Monitoring LFTs      Relevant Orders   Comprehensive metabolic panel   DM2 (diabetes mellitus, type 2) (HCC)    On diet      PAF (paroxysmal atrial fibrillation) (HCC)    Cont w/Bisoprolol, Coumadin      Relevant Medications   sacubitril-valsartan (ENTRESTO) 24-26 MG      Meds ordered this encounter  Medications   sacubitril-valsartan (ENTRESTO) 24-26 MG    Sig: Take 1 tablet by mouth 2 (two) times daily.    Dispense:  60 tablet    Refill:  11      Follow-up: Return in about 4 weeks (around 05/16/2021) for a follow-up visit.  Walker Kehr, MD

## 2021-04-18 NOTE — Assessment & Plan Note (Signed)
Off alcohol Monitoring LFTs

## 2021-04-18 NOTE — Assessment & Plan Note (Signed)
Doing well 

## 2021-04-18 NOTE — Assessment & Plan Note (Signed)
  On diet  

## 2021-04-20 ENCOUNTER — Ambulatory Visit: Payer: Medicare Other

## 2021-04-20 ENCOUNTER — Other Ambulatory Visit: Payer: Self-pay

## 2021-04-20 ENCOUNTER — Ambulatory Visit (INDEPENDENT_AMBULATORY_CARE_PROVIDER_SITE_OTHER): Payer: Medicare Other | Admitting: *Deleted

## 2021-04-20 DIAGNOSIS — Z7901 Long term (current) use of anticoagulants: Secondary | ICD-10-CM

## 2021-04-20 DIAGNOSIS — I059 Rheumatic mitral valve disease, unspecified: Secondary | ICD-10-CM

## 2021-04-20 DIAGNOSIS — Z9889 Other specified postprocedural states: Secondary | ICD-10-CM | POA: Diagnosis not present

## 2021-04-20 DIAGNOSIS — I4891 Unspecified atrial fibrillation: Secondary | ICD-10-CM

## 2021-04-20 DIAGNOSIS — I6350 Cerebral infarction due to unspecified occlusion or stenosis of unspecified cerebral artery: Secondary | ICD-10-CM | POA: Diagnosis not present

## 2021-04-20 DIAGNOSIS — I10 Essential (primary) hypertension: Secondary | ICD-10-CM

## 2021-04-20 DIAGNOSIS — I5032 Chronic diastolic (congestive) heart failure: Secondary | ICD-10-CM

## 2021-04-20 LAB — POCT INR: INR: 2.5 (ref 2.0–3.0)

## 2021-04-20 NOTE — Chronic Care Management (AMB) (Addendum)
Chronic Care Management   CCM RN Visit Note  04/20/2021 Name: Anthony Gould MRN: [id number] DOB: 1933-04-07  Subjective: Anthony Gould is a 88 y.o. year old male who is a primary care patient of Plotnikov, Evie Lacks, MD. The care management team was consulted for assistance with disease management and care coordination needs.    Engaged with patient by telephone for follow up visit in response to provider referral for case management and/or care coordination services.   Consent to Services:  The patient was given information about Chronic Care Management services, agreed to services, and gave verbal consent prior to initiation of services.  Please see initial visit note for detailed documentation.  Patient agreed to services and verbal consent obtained.   Assessment: Review of patient past medical history, allergies, medications, health status, including review of consultants reports, laboratory and other test data, was performed as part of comprehensive evaluation and provision of chronic care management services.   SDOH (Social Determinants of Health) assessments and interventions performed:  SDOH Interventions    Flowsheet Row Most Recent Value  SDOH Interventions   Transportation Interventions Intervention Not Indicated  [Patient confirms he has resumed driving self post- recent hospitalizations]        CCM Care Plan  Allergies  Allergen Reactions   Diltiazem Other (See Comments)    "Bradycardia," per pt; tolerates Amlodipine   Sulfa Antibiotics Rash and Other (See Comments)    Reaction not fully recalled    Sulfonamide Derivatives Rash and Other (See Comments)    Reaction not fully recalled    Outpatient Encounter Medications as of 04/20/2021  Medication Sig   acetaminophen (TYLENOL) 500 MG tablet Take 500 mg by mouth every 6 (six) hours as needed for fever.   allopurinol (ZYLOPRIM) 300 MG tablet TAKE 1 TABLET BY MOUTH DAILY AS NEEDED. GENERIC EQUIVALENT FOR  ZYLOPRIM (Patient taking differently: Take 300 mg by mouth daily.)   aspirin 81 MG EC tablet Take 81 mg by mouth daily.   atorvastatin (LIPITOR) 20 MG tablet TAKE 1 TABLET BY MOUTH TWO TIMES A WEEK (Patient taking differently: Take 20 mg by mouth 2 (two) times a week. on Monday and Friday night)   bisoprolol (ZEBETA) 5 MG tablet Take 1 tablet (5 mg total) by mouth daily.   Cholecalciferol (VITAMIN D3) 50 MCG (2000 UT) capsule TAKE 1 CAPSULE BY MOUTH EVERY DAY (Patient taking differently: Take 2,000 Units by mouth daily.)   dipyridamole (PERSANTINE) 50 MG tablet Take 1 tablet (50 mg total) by mouth 3 (three) times daily.   feeding supplement (ENSURE ENLIVE / ENSURE PLUS) LIQD Take 237 mLs by mouth 2 (two) times daily between meals.   ferrous [id number] C-folic acid (TRINSICON / FOLTRIN) capsule Take 1 capsule by mouth 2 (two) times daily after a meal. (Patient taking differently: Take 1 capsule by mouth daily.)   furosemide (LASIX) 40 MG tablet Take 1 tablet (40 mg total) by mouth daily.   latanoprost (XALATAN) 0.005 % ophthalmic solution Place 1 drop into both eyes at bedtime.   saccharomyces boulardii (FLORASTOR) 250 MG capsule Take 1 capsule (250 mg total) by mouth 2 (two) times daily.   sacubitril-valsartan (ENTRESTO) 24-26 MG Take 1 tablet by mouth 2 (two) times daily.   warfarin (COUMADIN) 5 MG tablet TAKE 1 TABLET BY MOUTH AS DIRECTED BY THE COUMADIN CLINIC (Patient taking differently: Take 2.5-5 mg by mouth See admin instructions. Take 2.5mg  by mouth daily on Monday and Friday, then take 5mg  all other  days or as directed by the Coumadin clinic)   No facility-administered encounter medications on file as of 04/20/2021.   Patient Active Problem List   Diagnosis Date Noted   C. difficile diarrhea 03/15/2021   Malnutrition ([place]) 03/15/2021   Fall 03/15/2021   Hypophosphatemia 03/01/2021   Diarrhea 02/28/2021   Biventricular heart failure ([place]) 02/28/2021   Recurrent colitis due to  Clostridium difficile 02/28/2021   Acute on chronic diastolic CHF (congestive heart failure) ([place]) 02/27/2021   Persistent atrial fibrillation ([place]) 02/27/2021   Sepsis ([place]) 02/04/2021   Appendiceal abscess 01/10/2021   Hyponatremia 12/30/2020   Hypokalemia 12/30/2020   Iron deficiency anemia 12/30/2020   Postoperative infected hematoma due to E. coli 12/29/2020   Supratherapeutic INR 12/29/2020   Anemia 12/28/2020   H/O mitral valve replacement with mechanical valve    Acute appendicitis 12/03/2020   Insomnia 09/27/2020   Grief at loss of child 09/01/2020   Right hip pain 08/10/2020   Elevated TSH 07/07/2020   CRI (chronic renal insufficiency), stage 3 (moderate) ([place]) 04/08/2020   Neoplasm of uncertain behavior of skin 10/01/2019   Hypomagnesemia 04/02/2019   Syncope 03/05/2019   Abdominal pain 01/20/2019   Vitamin D deficiency 11/28/2018   Low back pain 11/26/2018   Skin cancer 11/26/2018   Melanoma in situ ([place]) 08/26/2018   Foot lesion 04/23/2018   Hematuria 12/18/2017   Night sweats 12/18/2017   Chronic diastolic heart failure ([place]) 09/16/2016   Food allergy 02/13/2016   Cirrhosis of liver without ascites ([place]) 10/12/2015   Nausea with vomiting 10/12/2015   Emesis 09/20/2015   Aspiration pneumonia ([place]) 09/03/2015   Food poisoning 09/02/2015   Hematemesis 09/02/2015   Cough 09/02/2015   Flank pain, acute 05/02/2015   Actinic keratoses 10/20/2014   NSVT (nonsustained ventricular tachycardia) ([place]) 02/16/2013   Routine health maintenance 02/06/2012   Paroxysmal atrial fibrillation ([place]) 09/06/2011   Long term (current) use of anticoagulants 07/04/2010   Gout 06/27/2010   Mitral valve disorder 06/06/2010   Chest pain 02/28/2010   TOXIC LABYRINTHITIS 08/04/2009   Hearing loss 07/07/2009   DYSPEPSIA, CHRONIC 06/23/2007   DM2 (diabetes mellitus, type 2) ([place]) 02/08/2007   Hyperlipidemia 01/31/2007   Essential hypertension 01/31/2007   Coronary atherosclerosis  01/31/2007   PAF (paroxysmal atrial fibrillation) ([place]) 01/31/2007   Cerebral artery occlusion with cerebral infarction ([place]) 01/31/2007   Transient cerebral ischemia 01/31/2007   MITRAL VALVE REPLACEMENT, HX OF 01/31/2007   Conditions to be addressed/monitored:  CHF, HTN, and c-difficile  Care Plan : RN Care Manager Plan of Care  Updates made by Knox Royalty, RN since 04/20/2021 12:00 AM     Problem: Chronic Disease Management Needs   Priority: High     Long-Range Goal: Ongoing adherence to established plan of care for long term chronic disease management   Start Date: 03/16/2021  Expected End Date: 03/16/2022  Note:   Current Barriers:  Chronic Disease Management support and education needs related to CHF, HTN, and C-difficile Hard of Hearing Two recent hospitalizations: November 12-20, 2022: Sepsis secondary to C-Difficile December 4-9, 2022: Dehydration secondary to AKI/ ongoing C-Difficile  RNCM Clinical Goal(s):  Patient will demonstrate ongoing health management independence as evidenced by adherence to plan of care for CHF; HTN; C-Difficile        through collaboration with RN Care manager, provider, and care team.   Interventions: 1:1 collaboration with primary care provider regarding development and update of comprehensive plan of care as evidenced by provider attestation  and co-signature Inter-disciplinary care team collaboration (see longitudinal plan of care) Evaluation of current treatment plan related to  self management and patient's adherence to plan as established by provider Initial assessment completed 03/16/22 Recent fall post-hospital discharge: fell while walking up first 4 steps of stairs at home, getting to stair lift which is installed in home:  reports he is planning to upgrade stair lift to include bottom steps "soon;" currently using cane and walker as needed; fall assessment completed and fall prevention education provided 04/20/21- reports another  fall in January 2023- without injury, while filling bird feeders up; was able to get self up; continues using cane as needed when goes outside of home; previously provided education around fall risks/ prevention reinforced today 04/20/21- Pain assessment updated: patient continues to deny acute/ chronic pain 04/20/21- SDOH for driving updated: patient confirms he has resumed driving 2/67/12- Reviewed PCP office visit from 04/19/21: patient verbalizes excellent understanding of post-visit instructions; we reviewed lab work today 04/20/21- confirmed home health services have signed off; patient reports he has continues performing exercises at home and has also incorporated gentle weight lifting for upper extremities, currently using 3 lb weights 04/20/21- confirmed no medication concerns; patient continues to independently self manage medications  Heart Failure Interventions:  (Status: 04/20/21: Goal on Track (progressing): YES.)  Long Term Goal  Basic overview and discussion of pathophysiology of Heart Failure reviewed Discussed importance of daily weight and advised patient to weigh and record daily Reviewed role of diuretics in prevention of fluid overload and management of heart failure Discussed the importance of keeping all appointments with provider Confirms patient monitors/ writes down daily weights at home: reports very good baseline understanding of rationale/ purpose for daily weight monitoring, as well as weight gain guidelines to report weight gain > 3 lbs/ overnight and 5 lbs/ one week, to cardiology or PCP provider; reports general weight ranges over last month between 153-155 lbs with a weight this morning of 153.4 lbs Reviewed recent ECHO results: stable/ no changes; confirmed patient has plans to attend as scheduled cardiology provider appointment 04/24/21 Reinforced previously provided education around signs/ symptoms of CHF yellow zone: he denies clinical concerns today; reports minimal  baseline shortness of breath intermittently with activity: continues to report "recovers completely with rest, within a very few minutes;" denies shortness of breath outside of baseline; reports "no swelling this morning in ankles;" and acknowledges that he generally has slight swelling at end of day as per previously reported baseline Confirmed patient monitors blood pressures at home weekly- denies concerns around recent blood pressures post-hospital discharge; reports general ranges between 110-130/ 50-70, with a blood pressure this week of 120/66 Discussed use of salt in diet, in setting of CHF/ HTN: patient is very astute regarding his lab values; he is not currently restricting sodium, as he has had low sodium levels in the past with his C-Diff diagnosis-- this is reasonable; education provided/ we discussed need to keep sodium levels normal to prevent dehydration Provided education around slightly elevated BUN level: he is also planning to discuss with PCP at follow up visit, scheduled 05/16/21  Recurrent C-Difficile requiring hospitalization  (Status: 04/20/21: Goal on Track (progressing): YES.) Long Term Goal  Evaluation of current treatment plan related to  C-Difficile management ,  self-management and patient's adherence to plan as established by provider. Discussed plans with patient for ongoing care management follow up and provided patient with direct contact information for care management team Confirmed patient's appetite is: "Great" Confirmed he is  continuing to take pro-biotics Confirmed patient following essentially regular diet  Discussed/ provided education around general infection control practices to prevent transmission of C-Difficile/ other infections: he reports he has been avoiding getting a haircut because he does not want to potentially infect any one else; encouraged patient to continue practicing good daily hygiene/ infection control practices; assured patient that he is able  to go ahead and schedule his haircut, and to take precautions accordingly Reinforced previously provided education around process to follow should diarrhea persist/ continue/ recur, with or without other signs/ symptoms infection: call main number for PCP office immediately at first signs of symptoms to arrange/ schedule urgent provider office visit or speak with triage nurse; seek urgent/ emergent care if necessary: patient verbalizes ongoing understanding  Patient Goals/Self-Care Activities: As evidenced by review of EHR, collaboration with care team, and patient reporting during CCM RN CM outreach,  Patient [name] will: Take medications as prescribed Attend all scheduled provider appointments Call pharmacy for medication refills Call provider office for new concerns or questions Try to follow heart healthy, low cholesterol diet; I am glad your appetite is good! Keep up the great work monitoring your daily weights at home- today, you reported a weight of 153.4 lbs Continue monitoring and writing down on paper your blood pressures weekly  Continue your efforts to prevent falls at home- use your cane and/ or walker as needed    Plan: Telephone follow up appointment with care management team member scheduled for:  Monday, May 22, 2021 at 2:15 pm The patient has been provided with contact information for the care management team and has been advised to call with any health related questions or concerns  Oneta Rack, RN, BSN, [place] [phone]: direct office   Medical screening examination/treatment/procedure(s) were performed by non-physician practitioner and as supervising physician I was immediately available for consultation/collaboration.  I agree with above. Lew Dawes, MD

## 2021-04-20 NOTE — Patient Instructions (Signed)
Description   Take an extra tablet tonight and then START taking continue taking 1 tablet daily except 1.5 tablets on Sundays, Wednesdays and Fridays.   - Recheck INR in 2 weeks Cou[facility]in Clinic [phone]. Be consistent with Ensure (1 daily)

## 2021-04-20 NOTE — Patient Instructions (Addendum)
Visit Information  Anthony Gould, thank you for taking time to talk with me today. Please don't hesitate to contact me if I can be of assistance to you before our next scheduled telephone appointment.  Below are the goals we discussed today:  Patient Self-Care Activities: Patient Anthony Gould will: Take medications as prescribed Attend all scheduled provider appointments Call pharmacy for medication refills Call provider office for new concerns or questions Try to follow heart healthy, low cholesterol diet; I am glad your appetite is good! Keep up the great work monitoring your daily weights at home- today, you reported a weight of 153.4 lbs Continue monitoring and writing down on paper your blood pressures weekly  Continue your efforts to prevent falls at home- use your cane and/ or walker as needed  Our next scheduled telephone follow up visit/ appointment with care management team member is scheduled on:  Monday, May 22, 2021 at 2:15 pm- This is a PHONE [facility] appointment  If you need to cancel or re-schedule our visit, please call [phone] and our care guide team will be happy to assist you.   I look forward to hearing about your progress.   Oneta Rack, RN, BSN, [place] [phone]: direct office  If you are experiencing a Mental Health or [place] or need someone to talk to, please  call the Suicide and Crisis Lifeline: 988 call the [place] National Suicide Prevention Lifeline: 540-672-0509 or TTY: (682) 382-2625 TTY 989-681-4808) to talk to a trained counselor call 1-800-273-TALK (toll free, 24 hour hotline) go to [facility] [address], [place] [phone]) call 911   Patient verbalizes understanding of instructions and care plan provided today and agrees to view in [place]. Active MyChart status confirmed with patient   Blood Urea Nitrogen Test Why am I  having this test? The blood urea nitrogen (BUN) test is used to evaluate kidney and liver function and to monitor people with kidney problems or kidney failure. Many diseases or conditions that affect the kidneys or liver can affect the amount of urea in your blood. The BUN test is often done during routine lab testing. It is usually interpreted along with a test called a creatinine test. The BUN/creatinine ratio is a good indicator of kidney and liver function. What is being tested? The BUN test measures the amount of urea nitrogen in your blood. Urea nitrogen is made in the liver. It is a product of digestion and the breakdown of protein. What kind of sample is taken? A blood sample is required for this test. It is usually collected by inserting a needle into a blood vessel. Tell a health care provider about: Any medical conditions you have. All medicines you are taking, including vitamins, herbs, eye drops, creams, and over-the-counter medicines. Whether you are pregnant or may be pregnant. How are the results reported? Your test result will be reported as a value. Your health care provider will compare your results to normal ranges that were established after testing a large group of people (reference ranges). Reference ranges may vary among labs and hospitals. For this test, common reference ranges are: Umbilical cord: 75-91 mg/dL. Newborn: 3-12 mg/dL. Infant: 5-18 mg/dL. Child: 5-18 mg/dL. Adult: 10-20 mg/dL or 3.6-7.1 mmol/L (SI units). Older adult (39 years or older): Values may be slightly higher than those of a younger adult. What do the results mean? Abnormally high BUN levels may indicate: Kidney disease or blockage (obstruction)  of urine flow. Too much protein being consumed or broken down. Decrease in blood volume (hypovolemia). Dehydration. Bleeding in the digestive tract. Heart failure. Acute heart attack (myocardial infarction, MI). Abnormally low BUN levels may  indicate: Liver failure. Too much hydration. Not eating enough food (malnutrition). Talk with your health care provider about what your results mean. Questions to ask your health care provider Ask your health care provider, or the department that is doing the test: When will my results be ready? How will I get my results? What are my treatment options? What other tests do I need? What are my next steps? Summary The blood urea nitrogen (BUN) test is used to check for kidney and liver function and to monitor people with kidney problems or kidney failure. This test measures the amount of urea nitrogen in your blood. High or low levels of urea may indicate the presence of certain conditions or diseases. Talk with your health care provider about what your results mean. This information is not intended to replace advice given to you by your health care provider. Make sure you discuss any questions you have with your health care provider. Document Revised: 06/05/2019 Document Reviewed: 06/05/2019 Elsevier Patient Education  [place].

## 2021-04-24 ENCOUNTER — Other Ambulatory Visit: Payer: Self-pay

## 2021-04-24 ENCOUNTER — Ambulatory Visit: Payer: Medicare Other | Admitting: Cardiovascular Disease

## 2021-04-24 ENCOUNTER — Encounter: Payer: Self-pay | Admitting: Cardiovascular Disease

## 2021-04-24 VITALS — BP 134/76 | HR 76 | Ht 69.0 in | Wt 156.2 lb

## 2021-04-24 DIAGNOSIS — I5032 Chronic diastolic (congestive) heart failure: Secondary | ICD-10-CM

## 2021-04-24 DIAGNOSIS — I10 Essential (primary) hypertension: Secondary | ICD-10-CM | POA: Diagnosis not present

## 2021-04-24 DIAGNOSIS — I059 Rheumatic mitral valve disease, unspecified: Secondary | ICD-10-CM | POA: Diagnosis not present

## 2021-04-24 DIAGNOSIS — I4821 Permanent atrial fibrillation: Secondary | ICD-10-CM

## 2021-04-24 DIAGNOSIS — E7800 Pure hypercholesterolemia, unspecified: Secondary | ICD-10-CM

## 2021-04-24 NOTE — Progress Notes (Signed)
Chief Complaint  Patient presents with   Follow-up    Chronic diastolic CHF   History of Present Illness: 88 yo male with history of severe mitral regurgitation s/p MVR in 1986, permanent atrial fibrillation, pulmonary HTN, HTN, HLD, DM, CKD stage III and prior TIA who is here today for cardiac follow up. He has been followed by Dr. Saunders Revel. I saw him for the first time in August 2020. He had MVR in 1986. He has been on coumadin. He has had prior TIA and has been on ASA and dypridamole. Syncope in 2020. Echo in December 2020 with LVEF=55-60%, moderate LVH, MVR with moderate MR. Cardiac monitor in 2020 with atrial fib, occasional PVCs, NSVT. He stopped his Cardizem. He was admitted in September 2022 with an appendectomy. He developed an abscess and was readmitted in October 2022 for treatment. He then developed C diff colitis and was admitted for several weeks. His norvasc and Lisinopril were stopped due to hypotension. I saw him in December 2022 and he c/o worsened LE edema. We discussed increasing his Lasix to 40 mg BID for several days. He was admitted that night to [facility] with worsened diarrhea and found to be C. Diff positive again. Echo during that visit with LVEF=45-50%.  He was diuresed with IV lasix and started on Entresto. Echo 04/17/21 with LVEF=40% with global hypokinesis. Moderate RV dysfunction with dilated RV. Severe dilation both atria. Normally functioning mitral valve replacement.   He is here today for follow up. The patient denies any chest pain, dyspnea, palpitations, lower extremity edema, orthopnea, PND, dizziness, near syncope or syncope.   Primary Care Physician: Cassandria Anger, MD  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   NSVT (nonsustained ventricular  tachycardia)    Peripheral edema    venous insufficiency   Permanent atrial fibrillation ([place])    Pulmonary hypertension ([place])    Syncope    TIA (transient ischemic attack)    while on coumadin   Type II or unspecified type diabetes mellitus without mention of complication, not stated as uncontrolled    lifestyle mangement   Ventricular ectopy    symptomatic    Past Surgical History:  Procedure Laterality Date   CHOLECYSTECTOMY, LAPAROSCOPIC  2003   IR PATIENT EVAL TECH 0-60 MINS  01/09/2021   IR RADIOLOGIST EVAL & MGMT  01/19/2021   IR RADIOLOGIST EVAL & MGMT  02/02/2021   LAPAROSCOPIC APPENDECTOMY N/A 12/05/2020   Procedure: APPENDECTOMY LAPAROSCOPIC;  Surgeon: Stark Klein, MD;  Location: [facility];  Service: General;  Laterality: N/A;   MITRAL VALVE REPLACEMENT     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     allopurinol (ZYLOPRIM) 300 MG tablet TAKE 1 TABLET BY MOUTH DAILY AS NEEDED. GENERIC EQUIVALENT FOR ZYLOPRIM (Patient taking differently: Take 300 mg by mouth daily.) 90 tablet 3   aspirin 81 MG EC tablet Take 81 mg by mouth daily.     atorvastatin (LIPITOR) 20 MG tablet TAKE 1 TABLET BY MOUTH TWO TIMES A WEEK (Patient taking differently: Take 20  mg by mouth 2 (two) times a week. on Monday and Friday night) 26 tablet 3   bisoprolol (ZEBETA) 5 MG tablet Take 1 tablet (5 mg total) by mouth daily. 90 tablet 3   Cholecalciferol (VITAMIN D3) 50 MCG (2000 UT) capsule TAKE 1 CAPSULE BY MOUTH EVERY DAY (Patient taking differently: Take 2,000 Units by mouth daily.) 100 capsule 3   dipyridamole (PERSANTINE) 50 MG tablet Take 1 tablet (50 mg total) by mouth 3 (three) times daily. 270 tablet 3   feeding supplement (ENSURE ENLIVE / ENSURE PLUS) LIQD Take 237 mLs by mouth 2 (two) times daily between meals. 5000 mL 0    ferrous [id number] C-folic acid (TRINSICON / FOLTRIN) capsule Take 1 capsule by mouth 2 (two) times daily after a meal. (Patient taking differently: Take 1 capsule by mouth daily.) 60 capsule 1   furosemide (LASIX) 40 MG tablet Take 1 tablet (40 mg total) by mouth daily. 30 tablet 3   latanoprost (XALATAN) 0.005 % ophthalmic solution Place 1 drop into both eyes at bedtime.     saccharomyces boulardii (FLORASTOR) 250 MG capsule Take 1 capsule (250 mg total) by mouth 2 (two) times daily. 180 capsule 0   sacubitril-valsartan (ENTRESTO) 24-26 MG Take 1 tablet by mouth 2 (two) times daily. 60 tablet 11   warfarin (COUMADIN) 5 MG tablet TAKE 1 TABLET BY MOUTH AS DIRECTED BY THE COUMADIN CLINIC (Patient taking differently: Take 2.5-5 mg by mouth See admin instructions. Take 2.5mg  by mouth daily on Monday and Friday, then take 5mg  all other days or as directed by the Coumadin clinic) 100 tablet 1   No current facility-administered medications for this visit.    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  Tobacco Use   Smoking status: Never   Smokeless tobacco: Never  Substance and Sexual Activity   Alcohol use: No    Alcohol/week: 0.0 standard drinks   Drug use: No   Sexual activity: Not on file  Other Topics Concern   Not on file  Social History Narrative   HSG, many management courses Married '58, 2 sons - '61, '62   Social Determinants of Health   Emergency planning/management officer  Strain: Not on file  Food Insecurity: No Food Insecurity   Worried About Charity fundraiser in the Last Year: Never true   Arboriculturist in the Last Year: Never true  Transportation Needs: No Transportation Needs   Lack of Transportation (Medical): No   Lack of Transportation (Non-Medical): No  Physical Activity: Not on file  Stress: Not on file  Social Connections: Not on file  Intimate Partner Violence: Not on file    Family History  Problem Relation Age of Onset   Coronary artery disease Father    Diabetes Other    Coronary artery disease Other    Prostate cancer Neg Hx    Colon cancer Neg Hx  Review of Systems:  As stated in the HPI and otherwise negative.   BP 134/76    Pulse 76    Ht 5\' 9"  (1.753 m)    Wt 156 lb 3.2 oz (70.9 kg)    SpO2 98%    BMI 23.07 kg/m   Physical Examination:  General: Well developed, well nourished, NAD  HEENT: OP clear, mucus membranes moist  SKIN: warm, dry. No rashes. Neuro: No focal deficits  Musculoskeletal: Muscle strength 5/5 all ext  Psychiatric: Mood and affect normal  Neck: No JVD, no carotid bruits, no thyromegaly, no lymphadenopathy.  Lungs:Clear bilaterally, no wheezes, rhonci, crackles Cardiovascular: Irreg irreg. Valve click.  Abdomen:Soft. Bowel sounds present. Non-tender.  Extremities:Trivial bilateral lower extremity edema.  EKG:  EKG is not ordered today. The ekg ordered today demonstrates   Recent Labs: 02/04/2021: B Natriuretic Peptide 660.5; TSH 6.199 03/03/2021: Magnesium 1.8 03/15/2021: Hemoglobin 10.5; Platelets 250.0 04/18/2021: ALT 18; BUN 31; Creatinine, Ser 0.87; Potassium 4.2; Sodium 137   Lipid Panel    Component Value Date/Time   CHOL 140 04/01/2020 0808   TRIG 96.0 04/01/2020 0808   HDL 58.80 04/01/2020 0808   CHOLHDL 2 04/01/2020 0808   VLDL 19.2 04/01/2020 0808   LDLCALC 62 04/01/2020 0808     Wt Readings from Last 3 Encounters:  04/24/21 156 lb 3.2 oz (70.9 kg)  04/18/21 154 lb 12.8 oz  (70.2 kg)  03/15/21 160 lb 6.4 oz (72.8 kg)     Other studies Reviewed: Additional studies/ records that were reviewed today include:  Review of the above records demonstrates:    Assessment and Plan:   1. Persistent atrial fibrillation: Atrial fib today on exam. Rate controlled. Continue bisoprolol and coumadin.     2. Mitral valve disease: He is s/p mechanical mitral valve replacement. This is working well by echo last week. Will continue ASA and coumadin. He will use SBE prophylaxis as needed prior to dental procedures.   3. HTN: BP is well controlled. No changes  4. HLD: Lipids followed in primary care. LDL 62 in January 2022. Continue statin. Repeat lipids and LFTs now.   5. Chronic diastolic CHF: Weight is down. No LE edema. Continue Lasix 40 mg once daily. Follow daily weights. Extra Lasix as needed.   Current medicines are reviewed at length with the patient today.  The patient does not have concerns regarding medicines.  The following changes have been made:  no change  Labs/ tests ordered today include:   Orders Placed This Encounter  Procedures   Lipid panel     Disposition:   F/U with me in 6 months.    Signed, Lauree Chandler, MD 04/24/2021 3:13 PM    [facility] [address], [place], [place]  [address] Phone: [phone]; Fax: [phone]

## 2021-04-24 NOTE — Patient Instructions (Signed)
Medication Instructions:  Your physician recommends that you continue on your current medications as directed. Please refer to the Current Medication list given to you today.   *If you need a refill on your cardiac medications before your next appointment, please call your pharmacy*   Lab Work: Your physician recommends that you return for a FASTING lipid profile on 05/01/21. Lab is open from 7:15 am to 4:45 pm    If you have labs (blood work) drawn today and your tests are completely normal, you will receive your results only by: [place] (if you have MyChart) OR A paper copy in the mail If you have any lab test that is abnormal or we need to change your treatment, we will call you to review the results.   Testing/Procedures: None ordered    Follow-Up: At [facility], you and your health needs are our priority.  As part of our continuing mission to provide you with exceptional heart care, we have created designated Provider Care Teams.  These Care Teams include your primary Cardiologist (physician) and Advanced Practice Providers (APPs -  Physician Assistants and Nurse Practitioners) who all work together to provide you with the care you need, when you need it.  We recommend signing up for the patient portal called "MyChart".  Sign up information is provided on this After Visit Summary.  MyChart is used to connect with patients for Virtual Visits (Telemedicine).  Patients are able to view lab/test results, encounter notes, upcoming appointments, etc.  Non-urgent messages can be sent to your provider as well.   To learn more about what you can do with MyChart, go to [email or web address].    Your next appointment:   6 month(s)  The format for your next appointment:   In Person  Provider:   Lauree Chandler, MD     Other Instructions None

## 2021-04-25 DIAGNOSIS — I110 Hypertensive heart disease with heart failure: Secondary | ICD-10-CM

## 2021-04-25 DIAGNOSIS — I509 Heart failure, unspecified: Secondary | ICD-10-CM

## 2021-05-01 ENCOUNTER — Other Ambulatory Visit: Payer: Self-pay

## 2021-05-01 ENCOUNTER — Other Ambulatory Visit: Payer: Self-pay | Admitting: Internal Medicine

## 2021-05-01 ENCOUNTER — Other Ambulatory Visit: Payer: Medicare Other | Admitting: *Deleted

## 2021-05-01 DIAGNOSIS — E7800 Pure hypercholesterolemia, unspecified: Secondary | ICD-10-CM | POA: Diagnosis not present

## 2021-05-01 LAB — LIPID PANEL
Chol/HDL Ratio: 2.3 ratio (ref 0.0–5.0)
Cholesterol, Total: 153 mg/dL (ref 100–199)
HDL: 66 mg/dL (ref >39)
LDL Chol Calc (NIH): 73 mg/dL (ref 0–99)
Triglycerides: 73 mg/dL (ref 0–149)
VLDL Cholesterol Cal: 14 mg/dL (ref 5–40)

## 2021-05-04 ENCOUNTER — Other Ambulatory Visit: Payer: Self-pay

## 2021-05-04 ENCOUNTER — Ambulatory Visit: Payer: Medicare Other | Admitting: *Deleted

## 2021-05-04 DIAGNOSIS — I6350 Cerebral infarction due to unspecified occlusion or stenosis of unspecified cerebral artery: Secondary | ICD-10-CM

## 2021-05-04 DIAGNOSIS — I480 Paroxysmal atrial fibrillation: Secondary | ICD-10-CM | POA: Diagnosis not present

## 2021-05-04 DIAGNOSIS — I4891 Unspecified atrial fibrillation: Secondary | ICD-10-CM

## 2021-05-04 DIAGNOSIS — Z9889 Other specified postprocedural states: Secondary | ICD-10-CM

## 2021-05-04 DIAGNOSIS — Z7901 Long term (current) use of anticoagulants: Secondary | ICD-10-CM | POA: Diagnosis not present

## 2021-05-04 DIAGNOSIS — I059 Rheumatic mitral valve disease, unspecified: Secondary | ICD-10-CM | POA: Diagnosis not present

## 2021-05-04 DIAGNOSIS — G459 Transient cerebral ischemic attack, unspecified: Secondary | ICD-10-CM

## 2021-05-04 LAB — POCT INR: INR: 2.7 (ref 2.0–3.0)

## 2021-05-04 NOTE — Patient Instructions (Signed)
Description   Today take 2 tablets then start taking Warfarin 1.5 tablets daily except 1 tablet on Tuesdays, Thursdays, and Saturdays. Recheck INR in 2 weeks. Coumadin Clinic [phone]. Be consistent with Ensure (1 daily).

## 2021-05-12 ENCOUNTER — Telehealth: Payer: Self-pay | Admitting: Cardiovascular Disease

## 2021-05-12 NOTE — Telephone Encounter (Signed)
Pt c/o medication issue:  1. Name of Medication:  sacubitril-valsartan (ENTRESTO) 24-26 MG saccharomyces boulardii (FLORASTOR) 250 MG capsule  2. How are you currently taking this medication (dosage and times per day)?  Patient takes he take both medications 2x/day, as prescribed  3. Are you having a reaction (difficulty breathing--STAT)?  See below  4. What is your medication issue?   Patient states for the past 1-2 week he has been experiencing breakouts all over his body, causing terrible itchiness and bruising. He assumes this may be due to one of these medications. He states he has also been drinking 1 ensure daily.

## 2021-05-12 NOTE — Telephone Encounter (Signed)
Spoke with pt who reports he has developed a rash over the past 1-2 weeks.  Pt states rash looks like small red bumps and itches.  Rash is greatest around knees, wrists and elbows now.  He denies any difficulty breathing or swelling of his tongue or lips.  He has tried some Cortisone 10 which does offer some relief.  He reports itching is worse at night. Pt states he was started on Entresto and Florastar during hospital admission 02/26/2021.  He is not using any new soaps, detergents or lotions.  Pt states he has been on other medication for a long time. Pt advised to contact his PCP re: rash and to request an appointment for further evaluation.  Pt states he has an appt with PCP already scheduled for 05/16/2021.  Will also forward to Dr Angelena Form.  Reviewed ED precautions.  Pt verbalizes understanding and agrees with current plan.

## 2021-05-16 ENCOUNTER — Other Ambulatory Visit: Payer: Self-pay

## 2021-05-16 ENCOUNTER — Ambulatory Visit (INDEPENDENT_AMBULATORY_CARE_PROVIDER_SITE_OTHER): Payer: Medicare Other | Admitting: Internal Medicine

## 2021-05-16 ENCOUNTER — Encounter: Payer: Self-pay | Admitting: Internal Medicine

## 2021-05-16 DIAGNOSIS — N183 Chronic kidney disease, stage 3 unspecified: Secondary | ICD-10-CM

## 2021-05-16 DIAGNOSIS — E441 Mild protein-calorie malnutrition: Secondary | ICD-10-CM

## 2021-05-16 DIAGNOSIS — M1000 Idiopathic gout, unspecified site: Secondary | ICD-10-CM | POA: Diagnosis not present

## 2021-05-16 DIAGNOSIS — R21 Rash and other nonspecific skin eruption: Secondary | ICD-10-CM | POA: Diagnosis not present

## 2021-05-16 DIAGNOSIS — R739 Hyperglycemia, unspecified: Secondary | ICD-10-CM | POA: Diagnosis not present

## 2021-05-16 DIAGNOSIS — E559 Vitamin D deficiency, unspecified: Secondary | ICD-10-CM | POA: Diagnosis not present

## 2021-05-16 DIAGNOSIS — E538 Deficiency of other specified B group vitamins: Secondary | ICD-10-CM | POA: Diagnosis not present

## 2021-05-16 MED ORDER — METHYLPREDNISOLONE ACETATE 80 MG/ML IJ SUSP
80.0000 mg | Freq: Once | INTRAMUSCULAR | Status: AC
  Administered 2021-05-16: 80 mg via INTRAMUSCULAR

## 2021-05-16 MED ORDER — VALSARTAN 40 MG PO TABS: 20.0000 mg | ORAL_TABLET | Freq: Two times a day (BID) | ORAL | 3 refills | Status: DC

## 2021-05-16 MED ORDER — TRIAMCINOLONE ACETONIDE 0.1 % EX CREA: 1.0000 "application " | TOPICAL_CREAM | Freq: Four times a day (QID) | CUTANEOUS | 1 refills | Status: AC

## 2021-05-16 MED ORDER — DIPHENHYDRAMINE HCL 25 MG PO TABS: 25.0000 mg | ORAL_TABLET | Freq: Four times a day (QID) | ORAL | 1 refills | Status: DC | PRN

## 2021-05-16 NOTE — Assessment & Plan Note (Signed)
Pt refused to reduce Allopurinol to 100 mg/d

## 2021-05-16 NOTE — Assessment & Plan Note (Signed)
Dry skin vs allergy 2/23 Short cool showers Aquaphor qd-bid Benadryl prn Depo-medrol 80 mg IM D/c Entresto. Use Valsartan 20 mg bid

## 2021-05-16 NOTE — Patient Instructions (Signed)
Dry skin vs allergy  Short cool showers Aquaphor daily Benadryl as needed  Stop Entresto. Use Valsartan 20 mg twice a day

## 2021-05-16 NOTE — Assessment & Plan Note (Signed)
Drink Ensure

## 2021-05-16 NOTE — Addendum Note (Signed)
Addended by: Thomes Cake on: 05/16/2021 10:02 AM   Modules accepted: Orders

## 2021-05-16 NOTE — Progress Notes (Signed)
Subjective:  Patient ID: Anthony Gould, male    DOB: 1933-07-23  Age: 88 y.o. MRN: [id number]  CC: 4 week f/u (Pt mentioned that whenever he scratches his breaks out in a rash. Pt's wife mentioned that they have been new changes such as he is now taking a probiotic, he's drinking a boost shake.)   HPI [name] presents for rash all over x 2 weeks, itching. New Rx - Entresto. F/u CHF, wt loss  Outpatient Medications Prior to Visit  Medication Sig Dispense Refill   acetaminophen (TYLENOL) 500 MG tablet Take 500 mg by mouth every 6 (six) hours as needed for fever.     aspirin 81 MG EC tablet Take 81 mg by mouth daily.     atorvastatin (LIPITOR) 20 MG tablet TAKE 1 TABLET BY MOUTH TWO TIMES A WEEK (Patient taking differently: Take 20 mg by mouth 2 (two) times a week. on Monday and Friday night) 26 tablet 3   bisoprolol (ZEBETA) 5 MG tablet Take 1 tablet (5 mg total) by mouth daily. 90 tablet 3   Cholecalciferol (VITAMIN D3) 50 MCG (2000 UT) capsule TAKE 1 CAPSULE BY MOUTH EVERY DAY (Patient taking differently: Take 2,000 Units by mouth daily.) 100 capsule 3   dipyridamole (PERSANTINE) 50 MG tablet Take 1 tablet (50 mg total) by mouth 3 (three) times daily. 270 tablet 3   feeding supplement (ENSURE ENLIVE / ENSURE PLUS) LIQD Take 237 mLs by mouth 2 (two) times daily between meals. 5000 mL 0   ferrous [id number]-B51-WCHENID C-folic acid (TRINSICON / FOLTRIN) capsule Take 1 capsule by mouth 2 (two) times daily after a meal. (Patient taking differently: Take 1 capsule by mouth daily.) 60 capsule 1   furosemide (LASIX) 40 MG tablet TAKE 1 TABLET BY MOUTH DAILY. GENERIC EQUIVALENT FOR LASIX 90 tablet 1   latanoprost (XALATAN) 0.005 % ophthalmic solution Place 1 drop into both eyes at bedtime.     saccharomyces boulardii (FLORASTOR) 250 MG capsule Take 1 capsule (250 mg total) by mouth 2 (two) times daily. 180 capsule 0   warfarin (COUMADIN) 5 MG tablet TAKE 1 TABLET BY MOUTH AS DIRECTED BY  THE COUMADIN CLINIC (Patient taking differently: Take 2.5-5 mg by mouth See admin instructions. Take 2.5mg  by mouth daily on Monday and Friday, then take 5mg  all other days or as directed by the Coumadin clinic) 100 tablet 1   allopurinol (ZYLOPRIM) 300 MG tablet TAKE 1 TABLET BY MOUTH DAILY AS NEEDED. [place] (Patient taking differently: Take 300 mg by mouth daily.) 90 tablet 3   sacubitril-valsartan (ENTRESTO) 24-26 MG Take 1 tablet by mouth 2 (two) times daily. 60 tablet 11   No facility-administered medications prior to visit.    ROS: Review of Systems  Constitutional:  Positive for fatigue and unexpected weight change. Negative for appetite change.  HENT:  Positive for hearing loss. Negative for congestion, nosebleeds, sneezing, sore throat and trouble swallowing.   Eyes:  Negative for itching and visual disturbance.  Respiratory:  Negative for cough.   Cardiovascular:  Negative for chest pain, palpitations and leg swelling.  Gastrointestinal:  Negative for abdominal distention, blood in stool, diarrhea and nausea.  Genitourinary:  Negative for frequency and hematuria.  Musculoskeletal:  Negative for back pain, gait problem, joint swelling and neck pain.  Skin:  Positive for color change and rash.  Neurological:  Positive for weakness. Negative for dizziness, tremors and speech difficulty.  Hematological:  Bruises/bleeds easily.  Psychiatric/Behavioral:  Negative  for agitation, dysphoric mood and sleep disturbance. The patient is not nervous/anxious.    Objective:  BP 130/80    Pulse 72    Resp 18    Ht 5\' 9"  (1.753 m)    Wt 152 lb 6.4 oz (69.1 kg)    SpO2 98%    BMI 22.51 kg/m   BP Readings from Last 3 Encounters:  05/16/21 130/80  04/24/21 134/76  04/18/21 (!) 148/70    Wt Readings from Last 3 Encounters:  05/16/21 152 lb 6.4 oz (69.1 kg)  04/24/21 156 lb 3.2 oz (70.9 kg)  04/18/21 154 lb 12.8 oz (70.2 kg)    Physical Exam Constitutional:       General: He is not in acute distress.    Appearance: He is well-developed. He is not ill-appearing or toxic-appearing.     Comments: NAD  Eyes:     Conjunctiva/sclera: Conjunctivae normal.     Pupils: Pupils are equal, round, and reactive to light.  Neck:     Thyroid: No thyromegaly.     Vascular: No JVD.  Cardiovascular:     Rate and Rhythm: Normal rate and regular rhythm.     Heart sounds: Normal heart sounds. No murmur heard.   No friction rub. No gallop.  Pulmonary:     Effort: Pulmonary effort is normal. No respiratory distress.     Breath sounds: Normal breath sounds. No wheezing or rales.  Chest:     Chest wall: No tenderness.  Abdominal:     General: Bowel sounds are normal. There is no distension.     Palpations: Abdomen is soft. There is no mass.     Tenderness: There is no abdominal tenderness. There is no guarding or rebound.  Musculoskeletal:        General: No tenderness. Normal range of motion.     Cervical back: Normal range of motion.  Lymphadenopathy:     Cervical: No cervical adenopathy.  Skin:    General: Skin is warm and dry.     Findings: Bruising and rash present.  Neurological:     Mental Status: He is alert and oriented to person, place, and time.     Cranial Nerves: No cranial nerve deficit.     Motor: No abnormal muscle tone.     Coordination: Coordination normal.     Gait: Gait normal.     Deep Tendon Reflexes: Reflexes are normal and symmetric.  Psychiatric:        Behavior: Behavior normal.        Thought Content: Thought content normal.        Judgment: Judgment normal.  Thin Papules, excoriations - mostly B arms Hard hearing  Lab Results  Component Value Date   WBC 4.2 03/15/2021   HGB 10.5 (L) 03/15/2021   HCT 33.0 (L) 03/15/2021   PLT 250.0 03/15/2021   GLUCOSE 104 (H) 04/18/2021   CHOL 153 05/01/2021   TRIG 73 05/01/2021   HDL 66 05/01/2021   LDLCALC 73 05/01/2021   ALT 18 04/18/2021   AST 34 04/18/2021   NA 137 04/18/2021    K 4.2 04/18/2021   CL 103 04/18/2021   CREATININE 0.87 04/18/2021   BUN 31 (H) 04/18/2021   CO2 27 04/18/2021   TSH 6.199 (H) 02/04/2021   PSA 0.87 10/20/2014   INR 2.7 05/04/2021   HGBA1C 4.9 02/04/2021    ECHOCARDIOGRAM COMPLETE  Result Date: 02/27/2021    ECHOCARDIOGRAM REPORT   Patient Name:   EDSON  R Oley Date of Exam: 02/27/2021 Medical Rec #:  [id number]         Height:       69.0 in Accession #:    [id number]        Weight:       167.6 lb Date of Birth:  26-Nov-1933         BSA:          1.917 m Patient Age:    89 years          BP:           124/56 mmHg Patient Gender: M                 HR:           69 bpm. Exam Location:  Inpatient Procedure: 2D Echo, 3D Echo, Cardiac Doppler and Color Doppler Indications:     I50.40* Unspecified combined systolic (congestive) and                  diastolic (congestive) heart failure  History:         Patient has prior history of Echocardiogram examinations, most                  recent 03/09/2019. CHF, Abnormal ECG, Pulmonary HTN and TIA,                  Mitral Valve Disease, Arrythmias:Atrial Fibrillation and NSVT,                  Signs/Symptoms:Chest Pain and Syncope; Risk                  Factors:Hypertension and Diabetes. Mechanical mitral valve.                   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 Procedure Date: 8.  Sonographer:     Roseanna Rainbow RDCS Referring Phys:  [id number] [place] Diagnosing Phys: Oswaldo Milian MD  Sonographer Comments: Technically difficult study due to poor echo windows. IMPRESSIONS  1. Left ventricular ejection fraction, by estimation, is 40 to 45%. The left ventricle has mildly decreased function. The left ventricle demonstrates regional wall motion abnormalities (see scoring diagram/findings for description). Septal hypokinesis. There is moderate left ventricular hypertrophy. Left ventricular diastolic parameters are  indeterminate.  2. Right ventricular systolic function is moderately reduced. The right ventricular size is moderately enlarged. There is moderately elevated pulmonary artery systolic pressure. The estimated right ventricular systolic pressure is 87.5 mmHg.  3. Left atrial size was severely dilated.  4. Right atrial size was severely dilated.  5. 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 Procedure Date: 86. Trivial mitral valve regurgitation.     MG 35mmHg at 63bpm, EOA 2.4 cm^2  6. The tricuspid valve is abnormal. Tricuspid valve regurgitation is severe.  7. The aortic valve is tricuspid. Aortic valve regurgitation is trivial. Aortic valve sclerosis is present, with no evidence of aortic valve stenosis.  8. The inferior vena cava is dilated in size with <50% respiratory variability, suggesting right atrial pressure of 15 mmHg. FINDINGS  Left Ventricle: Left ventricular ejection fraction, by estimation, is 40 to 45%. The left ventricle has mildly decreased function. The left ventricle demonstrates regional wall motion abnormalities. The left ventricular internal cavity size  was normal in size. There is moderate left ventricular hypertrophy. Left ventricular diastolic parameters are indeterminate.  LV Wall Scoring: The entire septum is hypokinetic. The entire anterior wall, entire lateral wall, entire inferior wall, and apex are normal. Right Ventricle: The right ventricular size is moderately enlarged. Right vetricular wall thickness was not well visualized. Right ventricular systolic function is moderately reduced. There is moderately elevated pulmonary artery systolic pressure. The tricuspid regurgitant velocity is 3.09 m/s, and with an assumed right atrial pressure of 15 mmHg, the estimated right ventricular systolic pressure is 15.4 mmHg. Left Atrium: Left atrial size was severely dilated. Right Atrium: Right atrial size was severely dilated. Pericardium: There is no evidence of  pericardial effusion. Mitral Valve: The mitral valve has been repaired/replaced. Trivial mitral valve regurgitation. There is a unknown size Medtronic tilting disc valve present in the mitral position. Procedure Date: 55. MV peak gradient, 13.5 mmHg. The mean mitral valve gradient is 4.0 mmHg. Tricuspid Valve: The tricuspid valve is abnormal. Tricuspid valve regurgitation is severe. Aortic Valve: The aortic valve is tricuspid. Aortic valve regurgitation is trivial. Aortic valve sclerosis is present, with no evidence of aortic valve stenosis. Pulmonic Valve: The pulmonic valve was not well visualized. Pulmonic valve regurgitation is trivial. Aorta: The aortic root and ascending aorta are structurally normal, with no evidence of dilitation. Venous: The inferior vena cava is dilated in size with less than 50% respiratory variability, suggesting right atrial pressure of 15 mmHg. IAS/Shunts: The interatrial septum was not well visualized.  LEFT VENTRICLE PLAX 2D LVIDd:         5.10 cm      Diastology LVIDs:         4.00 cm      LV e' medial:    7.40 cm/s LV PW:         2.00 cm      LV E/e' medial:  22.2 LV IVS:        0.80 cm      LV e' lateral:   7.29 cm/s LVOT diam:     2.40 cm      LV E/e' lateral: 22.5 LV SV:         97 LV SV Index:   51 LVOT Area:     4.52 cm  LV Volumes (MOD) LV vol d, MOD A2C: 135.0 ml LV vol d, MOD A4C: 105.0 ml LV vol s, MOD A2C: 69.9 ml LV vol s, MOD A4C: 59.0 ml LV SV MOD A2C:     65.1 ml LV SV MOD A4C:     105.0 ml LV SV MOD BP:      53.6 ml RIGHT VENTRICLE            IVC RV S prime:     8.33 cm/s  IVC diam: 2.70 cm TAPSE (M-mode): 1.4 cm LEFT ATRIUM            Index        RIGHT ATRIUM           Index LA diam:      5.80 cm  3.03 cm/m   RA Area:     39.60 cm LA Vol (A2C): 72.1 ml  37.62 ml/m  RA Volume:   166.00 ml 86.62 ml/m LA Vol (A4C): 140.0 ml 73.05 ml/m  AORTIC VALVE             PULMONIC VALVE LVOT Vmax:   104.00 cm/s PR End Diast Vel: 2.22 msec LVOT Vmean:  67.300  cm/s LVOT  VTI:    0.215 m  AORTA Ao Root diam: 3.60 cm Ao Asc diam:  3.50 cm MITRAL VALVE                TRICUSPID VALVE MV Area (PHT): 3.48 cm     TR Peak grad:   38.2 mmHg MV Area VTI:   2.61 cm     TR Vmax:        309.00 cm/s MV Peak grad:  13.5 mmHg MV Mean grad:  4.0 mmHg     SHUNTS MV Vmax:       1.84 m/s     Systemic VTI:  0.22 m MV Vmean:      88.9 cm/s    Systemic Diam: 2.40 cm MV Decel Time: 218 msec MV E velocity: 164.00 cm/s Oswaldo Milian MD Electronically signed by Oswaldo Milian MD Signature Date/Time: 02/27/2021/6:41:19 PM    Final (Updated)     Assessment & Plan:   Problem List Items Addressed This Visit     CRI (chronic renal insufficiency), stage 3 (moderate) (HCC)    Pt refused to reduce Allopurinol to 100 mg/d      Gout    Pt refused to reduce Allopurinol to 100 mg/d      Malnutrition (HCC)    Drink Ensure      Rash    Dry skin vs allergy 2/23 Short cool showers Aquaphor qd-bid Benadryl prn Depo-medrol 80 mg IM D/c Entresto. Use Valsartan 20 mg bid         No orders of the defined types were placed in this encounter.     Follow-up: Return in about 4 weeks (around 06/13/2021) for a follow-up visit.  Walker Kehr, MD

## 2021-05-19 ENCOUNTER — Other Ambulatory Visit: Payer: Self-pay

## 2021-05-19 ENCOUNTER — Ambulatory Visit: Payer: Medicare Other

## 2021-05-19 DIAGNOSIS — I059 Rheumatic mitral valve disease, unspecified: Secondary | ICD-10-CM | POA: Diagnosis not present

## 2021-05-19 DIAGNOSIS — Z9889 Other specified postprocedural states: Secondary | ICD-10-CM | POA: Diagnosis not present

## 2021-05-19 DIAGNOSIS — G459 Transient cerebral ischemic attack, unspecified: Secondary | ICD-10-CM

## 2021-05-19 DIAGNOSIS — I480 Paroxysmal atrial fibrillation: Secondary | ICD-10-CM | POA: Diagnosis not present

## 2021-05-19 DIAGNOSIS — I4891 Unspecified atrial fibrillation: Secondary | ICD-10-CM

## 2021-05-19 DIAGNOSIS — I6350 Cerebral infarction due to unspecified occlusion or stenosis of unspecified cerebral artery: Secondary | ICD-10-CM | POA: Diagnosis not present

## 2021-05-19 DIAGNOSIS — Z7901 Long term (current) use of anticoagulants: Secondary | ICD-10-CM

## 2021-05-19 LAB — POCT INR: INR: 2.7 (ref 2.0–3.0)

## 2021-05-19 NOTE — Patient Instructions (Addendum)
-   Take 2 tablets tonight, then  - START NEW DOSAGE Warfarin 1.5 tablets daily except 1 tablet on TUESDAYS & SATURDAYS - Recheck INR in 2 weeks.  [facility]madin Clinic [phone]. Be consistent with Ensure (1 daily).

## 2021-05-22 ENCOUNTER — Ambulatory Visit (INDEPENDENT_AMBULATORY_CARE_PROVIDER_SITE_OTHER): Payer: Medicare Other | Admitting: *Deleted

## 2021-05-22 DIAGNOSIS — I10 Essential (primary) hypertension: Secondary | ICD-10-CM

## 2021-05-22 DIAGNOSIS — I5032 Chronic diastolic (congestive) heart failure: Secondary | ICD-10-CM

## 2021-05-22 NOTE — Chronic Care Management (AMB) (Addendum)
Chronic Care Management   CCM RN Visit Note  05/22/2021 Name: Anthony Gould MRN: [id number] DOB: 1933-11-04  Subjective: Anthony Gould is a 88 y.o. year old male who is a primary care patient of Plotnikov, Evie Lacks, MD. The care management team was consulted for assistance with disease management and care coordination needs.    Engaged with patient by telephone for follow up visit in response to provider referral for case management and/or care coordination services.   Consent to Services:  The patient was given information about Chronic Care Management services, agreed to services, and gave verbal consent prior to initiation of services.  Please see initial visit note for detailed documentation.  Patient agreed to services and verbal consent obtained.   Assessment: Review of patient past medical history, allergies, medications, health status, including review of consultants reports, laboratory and other test data, was performed as part of comprehensive evaluation and provision of chronic care management services.   SDOH (Social Determinants of Health) assessments and interventions performed:  SDOH Interventions    Flowsheet Row Most Recent Value  SDOH Interventions   Food Insecurity Interventions Intervention Not Indicated  [Continues to deny food insecurity]  Transportation Interventions Intervention Not Indicated  [Continues driving self]      CCM Care Plan  Allergies  Allergen Reactions   Diltiazem Other (See Comments)    "Bradycardia," per pt; tolerates Amlodipine   Sulfa Antibiotics Rash and Other (See Comments)    Reaction not fully recalled    Sulfonamide Derivatives Rash and Other (See Comments)    Reaction not fully recalled    Outpatient Encounter Medications as of 05/22/2021  Medication Sig   acetaminophen (TYLENOL) 500 MG tablet Take 500 mg by mouth every 6 (six) hours as needed for fever.   aspirin 81 MG EC tablet Take 81 mg by mouth daily.    atorvastatin (LIPITOR) 20 MG tablet TAKE 1 TABLET BY MOUTH TWO TIMES A WEEK (Patient taking differently: Take 20 mg by mouth 2 (two) times a week. on Monday and Friday night)   bisoprolol (ZEBETA) 5 MG tablet Take 1 tablet (5 mg total) by mouth daily.   Cholecalciferol (VITAMIN D3) 50 MCG (2000 UT) capsule TAKE 1 CAPSULE BY MOUTH EVERY DAY (Patient taking differently: Take 2,000 Units by mouth daily.)   diphenhydrAMINE (BENADRYL ALLERGY) 25 MG tablet Take 1 tablet (25 mg total) by mouth every 6 (six) hours as needed for itching (rash).   dipyridamole (PERSANTINE) 50 MG tablet Take 1 tablet (50 mg total) by mouth 3 (three) times daily.   feeding supplement (ENSURE ENLIVE / ENSURE PLUS) LIQD Take 237 mLs by mouth 2 (two) times daily between meals.   ferrous [id number] C-folic acid (TRINSICON / FOLTRIN) capsule Take 1 capsule by mouth 2 (two) times daily after a meal. (Patient taking differently: Take 1 capsule by mouth daily.)   furosemide (LASIX) 40 MG tablet TAKE 1 TABLET BY MOUTH DAILY. GENERIC EQUIVALENT FOR LASIX   latanoprost (XALATAN) 0.005 % ophthalmic solution Place 1 drop into both eyes at bedtime.   saccharomyces boulardii (FLORASTOR) 250 MG capsule Take 1 capsule (250 mg total) by mouth 2 (two) times daily.   triamcinolone cream (KENALOG) 0.1 % Apply 1 application topically 4 (four) times daily. On rash   valsartan (DIOVAN) 40 MG tablet Take 0.5 tablets (20 mg total) by mouth 2 (two) times daily.   warfarin (COUMADIN) 5 MG tablet TAKE 1 TABLET BY MOUTH AS DIRECTED BY THE COUMADIN CLINIC (Patient taking  differently: Take 2.5-5 mg by mouth See admin instructions. Take 2.7m by mouth daily on Monday and Friday, then take 544mall other days or as directed by the Coumadin clinic)   [DISCONTINUED] allopurinol (ZYLOPRIM) 300 MG tablet TAKE 1 TABLET BY MOUTH DAILY AS NEEDED. GENERIC EQUIVALENT FOR ZYLOPRIM (Patient taking differently: Take 300 mg by mouth daily.)   No facility-administered  encounter medications on file as of 05/22/2021.   Patient Active Problem List   Diagnosis Date Noted   Rash 05/16/2021   C. difficile diarrhea 03/15/2021   Malnutrition (HC[place]12/21/2022   Fall 03/15/2021   Hypophosphatemia 03/01/2021   Diarrhea 02/28/2021   Biventricular heart failure (HCLane12/08/2020   Recurrent colitis due to Clostridium difficile 02/28/2021   Acute on chronic diastolic CHF (congestive heart failure) (HCCarpenters[place]12/07/2020   Persistent atrial fibrillation (HCS[place]12/07/2020   Sepsis (HCHarm[place]11/02/2021   Appendiceal abscess 01/10/2021   Hyponatremia 12/30/2020   Hypokalemia 12/30/2020   Iron deficiency anemia 12/30/2020   Postoperative infected hematoma due to E. coli 12/29/2020   Supratherapeutic INR 12/29/2020   Anemia 12/28/2020   H/O mitral valve replacement with mechanical valve    Acute appendicitis 12/03/2020   Insomnia 09/27/2020   Grief at loss of child 09/01/2020   Right hip pain 08/10/2020   Elevated TSH 07/07/2020   CRI (chronic renal insufficiency), stage 3 (moderate) (HCSuarez01/14/2022   Neoplasm of uncertain behavior of skin 10/01/2019   Hypomagnesemia 04/02/2019   Syncope 03/05/2019   Abdominal pain 01/20/2019   Vitamin D deficiency 11/28/2018   Low back pain 11/26/2018   Skin cancer 11/26/2018   Melanoma in situ (HCSchererville06/04/2018   Foot lesion 04/23/2018   Hematuria 12/18/2017   Night sweats 12/18/2017   Chronic diastolic heart failure (HCHarvey06/24/2018   Food allergy 02/13/2016   Cirrhosis of liver without ascites (HCStotts City07/19/2017   Nausea with vomiting 10/12/2015   Emesis 09/20/2015   Aspiration pneumonia (HCShamrock Lakes06/12/2015   Food poisoning 09/02/2015   Hematemesis 09/02/2015   Cough 09/02/2015   Flank pain, acute 05/02/2015   Actinic keratoses 10/20/2014   NSVT (nonsustained ventricular tachycardia) (HCPot[place]11/24/2014   Routine health maintenance 02/06/2012   Paroxysmal atrial fibrillation (HCSible[place]06/13/2013   Long term (current) use of  anticoagulants 07/04/2010   Gout 06/27/2010   Mitral valve disorder 06/06/2010   Chest pain 02/28/2010   TOXIC LABYRINTHITIS 08/04/2009   Hearing loss 07/07/2009   DYSPEPSIA, CHRONIC 06/23/2007   DM2 (diabetes mellitus, type 2) (HCPablo Pena11/15/2008   Hyperlipidemia 01/31/2007   Essential hypertension 01/31/2007   Coronary atherosclerosis 01/31/2007   PAF (paroxysmal atrial fibrillation) (HCBev[place]11/09/2006   Cerebral artery occlusion with cerebral infarction (HCRead[place]11/09/2006   Transient cerebral ischemia 01/31/2007   MITRAL VALVE REPLACEMENT, HX OF 01/31/2007   Conditions to be addressed/monitored:  CHF and HTN  Care Plan : RN Care Manager Plan of Care  Updates made by ToKnox RoyaltyRN since 05/22/2021 12:00 AM     Problem: Chronic Disease Management Needs   Priority: High     Long-Range Goal: Ongoing adherence to established plan of care for long term chronic disease management   Start Date: 03/16/2021  Expected End Date: 03/16/2022  Note:   Current Barriers:  Chronic Disease Management support and education needs related to CHF, HTN, and C-difficile Hard of Hearing Two recent hospitalizations: November 12-20, 2022: Sepsis secondary to C-Difficile December 4-9, 2022: Dehydration secondary to AKI/ ongoing C-Difficile  RNCM Clinical Goal(s):  Patient will demonstrate ongoing  health management independence as evidenced by adherence to plan of care for CHF; HTN; C-Difficile        through collaboration with RN Care manager, provider, and care team.   Interventions: 1:1 collaboration with primary care provider regarding development and update of comprehensive plan of care as evidenced by provider attestation and co-signature Inter-disciplinary care team collaboration (see longitudinal plan of care) Evaluation of current treatment plan related to  self management and patient's adherence to plan as established by provider Initial assessment completed 03/16/22 05/22/21: SDOH updated:  no new/ unmet needs identified Pain assessment updated: patient continues to deny acute/ chronic pain Falls assessment updated: denies new/ recent falls since last outreach 04/20/21- occasionally uses cane; positive reinforcement provided with encouragement to continue efforts; previously provided education around fall risks/ prevention reinforced Reviewed upcoming scheduled provider office visits: 06/02/21- coumadin clinic; 06/13/21- PCP office visit; confirmed patient aware of all and verbalizes plans at attend as scheduled Confirmed no new medication concerns: patient continues independently self- managing medications and endorses adherence to taking all as prescribed; he confirms today that he stopped taking Entresto and started taking valsartan, as per PCP instructions during office visit 05/16/20 Reviewed 04/24/21 cardiology and 05/16/21 PCP provider office visits: patient verbalizes good understanding of post-visit instructions and denies questions post-visits Confirmed previously reported rash "is much better" now, denies ongoing concerns- did not tolerate prn Benadryl, made him sleepy- not taking Confirmed he is drinking ensure as advised and reports "real good appetite"  Heart Failure/ HTN Interventions:  (Status: 05/22/21: Goal on Track (progressing): YES.)  Long Term Goal  Basic overview and discussion of pathophysiology of Heart Failure reviewed Discussed importance of daily weight and advised patient to weigh and record daily Reviewed role of diuretics in prevention of fluid overload and management of heart failure Discussed the importance of keeping all appointments with provider Confirmed patient monitors/ writes down daily weights at home: reports very good baseline understanding of rationale/ purpose for daily weight monitoring, as well as weight gain guidelines to report weight gain > 3 lbs/ overnight and 5 lbs/ one week, to cardiology or PCP provider; reports general weight ranges over  last month between 144-146 lbs with a weight this morning of 145.8 lbs Confirms following heart healthy low salt diet Briefly reviewed recent lab work/ values from recent provider office visits Confirmed patient regularly exercises at home; tolerates well; positive reinforcement provided with encouragement to continue efforts Reinforced previously provided education around signs/ symptoms of CHF yellow zone: he denies clinical concerns today; continues to report minimal baseline shortness of breath intermittently with activity: continues to report "recovers completely with rest, within a very few minutes;" denies shortness of breath outside of baseline; reports "no swelling this morning in ankles;" and acknowledges that he intermittently has slight swelling at end of day as per previously reported baseline Today, patient reports not monitoring blood pressures as much because he is feeling "so much better and feeling so good;" encouraged him to consider occasionally monitoring at home, for his own awareness Provided education around signs/ symptoms MI/ CVA along with corresponding action plan for same  Recurrent C-Difficile requiring hospitalization  (Status: 05/22/21: Goal Met.) Long Term Goal  Patient confirms no recent issues/ bouts with ongoing signs/ symptoms C-Diff Reinforced previously provided education around process to follow should diarrhea recur, with or without other signs/ symptoms infection: call main number for PCP office immediately at first signs of symptoms to arrange/ schedule urgent provider office visit or speak with triage nurse;  seek urgent/ emergent care if necessary: patient verbalizes ongoing understanding  Patient Goals/Self-Care Activities: As evidenced by review of EHR, collaboration with care team, and patient reporting during CCM RN CM outreach,  Patient [name] will: Take medications as prescribed Attend all scheduled provider appointments Call pharmacy for medication  refills Call provider office for new concerns or questions Try to follow heart healthy, low cholesterol diet; I am glad your appetite is good! Keep up the great work monitoring your daily weights at home- today, you reported a weight of 145.8 lbs Consider occasionally checking and writing down on paper your blood pressures  Continue your efforts to prevent falls at home- use your cane and/ or walker as needed    Plan: Telephone follow up appointment with care management team member scheduled for:  Wednesday, Aug 16, 2021 at 2:15 pm The patient has been provided with contact information for the care management team and has been advised to call with any health related questions or concerns  Oneta Rack, RN, BSN, [place] [phone]: direct office    Medical screening examination/treatment/procedure(s) were performed by non-physician practitioner and as supervising physician I was immediately available for consultation/collaboration.  I agree with above. Lew Dawes, MD

## 2021-05-22 NOTE — Patient Instructions (Signed)
Visit Information  Anthony Gould, thank you for taking time to talk with me today. Please don't hesitate to contact me if I can be of assistance to you before our next scheduled telephone appointment.  Below are the goals we discussed today:  Patient Self-Care Activities: Patient Anthony Gould will: Take medications as prescribed Attend all scheduled provider appointments Call pharmacy for medication refills Call provider office for new concerns or questions Try to follow heart healthy, low cholesterol diet; I am glad your appetite is good! Keep up the great work monitoring your daily weights at home- today, you reported a weight of 145.8 lbs Consider occasionally checking and writing down on paper your blood pressures  Continue your efforts to prevent falls at home- use your cane and/ or walker as needed  Our next scheduled telephone follow up visit/ appointment is scheduled on:   Wednesday, Aug 16, 2021 at 2:15 pm- This is a PHONE CALL appointment  If you need to cancel or re-schedule our visit, please call [phone] and our care guide team will be happy to assist you.   I look forward to hearing about your progress.   Oneta Rack, RN, BSN, [place] [phone]: direct office  If you are experiencing a Mental Health or [place] or need someone to talk to, please  call the Suicide and Crisis Lifeline: 988 call the [place] National Suicide Prevention Lifeline: 7726082015 or TTY: 548-061-1577 TTY (680)270-7548) to talk to a trained counselor call 1-800-273-TALK (toll free, 24 hour hotline) go to [facility] [address] [place] [phone]) call 911   Patient verbalizes understanding of instructions and care plan provided today and agrees to view in [place]. Active MyChart status confirmed with patient  Heart Failure Action Plan A heart failure action plan helps you  understand what to do when you have symptoms of heart failure. Your action plan is a color-coded plan that lists the symptoms to watch for and indicates what actions to take. If you have symptoms in the red zone, you need medical care right away. If you have symptoms in the yellow zone, you are having problems. If you have symptoms in the green zone, you are doing well. Follow the plan that was created by you and your health care provider. Review your plan each time you visit your health care provider. Red zone These signs and symptoms mean you should get medical help right away: You have trouble breathing when resting. You have a dry cough that is getting worse. You have swelling or pain in your legs or abdomen that is getting worse. You suddenly gain more than 2-3 lb (0.9-1.4 kg) in 24 hours, or more than 5 lb (2.3 kg) in a week. This amount may be more or less depending on your condition. You have trouble staying awake or you feel confused. You have chest pain. You do not have an appetite. You pass out. You have worsening sadness or depression. If you have any of these symptoms, call your local emergency services (911 in the [place] right away. Do not drive yourself to the hospital. Yellow zone These signs and symptoms mean your condition may be getting worse and you should make some changes: You have trouble breathing when you are active, or you need to sleep with your head raised on extra pillows to help you breathe. You have swelling in your legs or abdomen. You gain 2-3 lb (0.9-1.4 kg) in  24 hours, or 5 lb (2.3 kg) in a week. This amount may be more or less depending on your condition. You get tired easily. You have trouble sleeping. You have a dry cough. If you have any of these symptoms: Contact your health care provider within the next day. Your health care provider may adjust your medicines. Green zone These signs mean you are doing well and can continue what you are doing: You  do not have shortness of breath. You have very little swelling or no new swelling. Your weight is stable (no gain or loss). You have a normal activity level. You do not have chest pain or any other new symptoms. Follow these instructions at home: Take over-the-counter and prescription medicines only as told by your health care provider. Weigh yourself daily. Your target weight is __________ lb (__________ kg). Call your health care provider if you gain more than __________ lb (__________ kg) in 24 hours, or more than __________ lb (__________ kg) in a week. Health care provider name: _____________________________________________________ Health care provider phone number: _____________________________________________________ Eat a heart-healthy diet. Work with a diet and nutrition specialist (dietitian) to create an eating plan that is best for you. Keep all follow-up visits. This is important. Where to find more information American Heart Association: www.heart.org Summary A heart failure action plan helps you understand what to do when you have symptoms of heart failure. Follow the action plan that was created by you and your health care provider. Get help right away if you have any symptoms in the red zone. This information is not intended to replace advice given to you by your health care provider. Make sure you discuss any questions you have with your health care provider. Document Revised: 10/26/2019 Document Reviewed: 10/26/2019 Elsevier Patient Education  2022 [employer].

## 2021-05-23 DIAGNOSIS — Z961 Presence of intraocular lens: Secondary | ICD-10-CM | POA: Diagnosis not present

## 2021-05-23 DIAGNOSIS — I5032 Chronic diastolic (congestive) heart failure: Secondary | ICD-10-CM | POA: Diagnosis not present

## 2021-05-23 DIAGNOSIS — I110 Hypertensive heart disease with heart failure: Secondary | ICD-10-CM | POA: Diagnosis not present

## 2021-05-23 DIAGNOSIS — H53432 Sector or arcuate defects, left eye: Secondary | ICD-10-CM | POA: Diagnosis not present

## 2021-05-23 DIAGNOSIS — H40053 Ocular hypertension, bilateral: Secondary | ICD-10-CM | POA: Diagnosis not present

## 2021-05-23 DIAGNOSIS — H35033 Hypertensive retinopathy, bilateral: Secondary | ICD-10-CM | POA: Diagnosis not present

## 2021-06-02 ENCOUNTER — Other Ambulatory Visit: Payer: Self-pay

## 2021-06-02 ENCOUNTER — Ambulatory Visit: Payer: Medicare Other

## 2021-06-02 DIAGNOSIS — I6350 Cerebral infarction due to unspecified occlusion or stenosis of unspecified cerebral artery: Secondary | ICD-10-CM

## 2021-06-02 DIAGNOSIS — G459 Transient cerebral ischemic attack, unspecified: Secondary | ICD-10-CM

## 2021-06-02 DIAGNOSIS — Z7901 Long term (current) use of anticoagulants: Secondary | ICD-10-CM

## 2021-06-02 DIAGNOSIS — I480 Paroxysmal atrial fibrillation: Secondary | ICD-10-CM

## 2021-06-02 DIAGNOSIS — I4891 Unspecified atrial fibrillation: Secondary | ICD-10-CM

## 2021-06-02 DIAGNOSIS — Z9889 Other specified postprocedural states: Secondary | ICD-10-CM | POA: Diagnosis not present

## 2021-06-02 DIAGNOSIS — I059 Rheumatic mitral valve disease, unspecified: Secondary | ICD-10-CM | POA: Diagnosis not present

## 2021-06-02 LAB — POCT INR: INR: 2.9 (ref 2.0–3.0)

## 2021-06-02 NOTE — Patient Instructions (Signed)
Description   ?Start taking 1.5 tablets daily except 1 tablet on Tuesdays.  Recheck INR in 2 weeks.  ?Coumadin Clinic [phone]. Be consistent with Ensure (1 daily).  ?  ?  ?

## 2021-06-13 ENCOUNTER — Ambulatory Visit: Payer: Medicare Other | Admitting: Internal Medicine

## 2021-06-14 ENCOUNTER — Ambulatory Visit: Payer: Medicare Other | Admitting: Internal Medicine

## 2021-06-14 ENCOUNTER — Other Ambulatory Visit: Payer: Self-pay | Admitting: Internal Medicine

## 2021-06-14 ENCOUNTER — Other Ambulatory Visit: Payer: Self-pay

## 2021-06-14 DIAGNOSIS — M545 Low back pain, unspecified: Secondary | ICD-10-CM

## 2021-06-14 DIAGNOSIS — E1159 Type 2 diabetes mellitus with other circulatory complications: Secondary | ICD-10-CM | POA: Diagnosis not present

## 2021-06-14 DIAGNOSIS — I2510 Atherosclerotic heart disease of native coronary artery without angina pectoris: Secondary | ICD-10-CM

## 2021-06-14 DIAGNOSIS — G8929 Other chronic pain: Secondary | ICD-10-CM

## 2021-06-14 DIAGNOSIS — E441 Mild protein-calorie malnutrition: Secondary | ICD-10-CM

## 2021-06-14 DIAGNOSIS — Z7901 Long term (current) use of anticoagulants: Secondary | ICD-10-CM | POA: Diagnosis not present

## 2021-06-14 DIAGNOSIS — A0472 Enterocolitis due to Clostridium difficile, not specified as recurrent: Secondary | ICD-10-CM | POA: Diagnosis not present

## 2021-06-14 MED ORDER — ALLOPURINOL 100 MG PO TABS: 100.0000 mg | ORAL_TABLET | Freq: Every day | ORAL | 0 refills | Status: DC

## 2021-06-14 NOTE — Assessment & Plan Note (Signed)
No rlapse ?On [place] ?

## 2021-06-14 NOTE — Patient Instructions (Signed)
Blue- Emu ?

## 2021-06-14 NOTE — Assessment & Plan Note (Signed)
On diet Check A1c 

## 2021-06-14 NOTE — Assessment & Plan Note (Signed)
Cont on Coumadin °

## 2021-06-14 NOTE — Assessment & Plan Note (Signed)
Blue- Emu ?

## 2021-06-14 NOTE — Assessment & Plan Note (Signed)
Cont on ASA, diltiazem d/c, Lipitor, Bisoprolol  ?

## 2021-06-14 NOTE — Assessment & Plan Note (Addendum)
Drink Ensure ?Gained 5 lbs ?The lowest wt was 143 lbs ?

## 2021-06-14 NOTE — Progress Notes (Signed)
? ?Subjective:  ?Patient ID: CIPRIANO MILLIKAN, male    DOB: 06/01/1933  Age: 88 y.o. MRN: [id number] ? ?CC: Follow-up (No concerns) ? ? ?HPI ?[name] presents for wt loss, hearing loss, PAF, rash and itching 90% better. He wants to try back Entresto. The lowest wt was 143 lbs ? ?Outpatient Medications Prior to Visit  ?Medication Sig Dispense Refill  ? acetaminophen (TYLENOL) 500 MG tablet Take 500 mg by mouth every 6 (six) hours as needed for fever.    ? aspirin 81 MG EC tablet Take 81 mg by mouth daily.    ? atorvastatin (LIPITOR) 20 MG tablet TAKE 1 TABLET BY MOUTH TWO TIMES A WEEK (Patient taking differently: Take 20 mg by mouth 2 (two) times a week. on Monday and Friday night) 26 tablet 3  ? bisoprolol (ZEBETA) 5 MG tablet Take 1 tablet (5 mg total) by mouth daily. 90 tablet 3  ? Cholecalciferol (VITAMIN D3) 50 MCG (2000 UT) capsule TAKE 1 CAPSULE BY MOUTH EVERY DAY (Patient taking differently: Take 2,000 Units by mouth daily.) 100 capsule 3  ? diphenhydrAMINE (BENADRYL ALLERGY) 25 MG tablet Take 1 tablet (25 mg total) by mouth every 6 (six) hours as needed for itching (rash). 60 tablet 1  ? dipyridamole (PERSANTINE) 50 MG tablet Take 1 tablet (50 mg total) by mouth 3 (three) times daily. 270 tablet 3  ? feeding supplement (ENSURE ENLIVE / ENSURE PLUS) LIQD Take 237 mLs by mouth 2 (two) times daily between meals. 5000 mL 0  ? ferrous [id number] C-folic acid (TRINSICON / FOLTRIN) capsule Take 1 capsule by mouth 2 (two) times daily after a meal. (Patient taking differently: Take 1 capsule by mouth daily.) 60 capsule 1  ? furosemide (LASIX) 40 MG tablet TAKE 1 TABLET BY MOUTH DAILY. GENERIC EQUIVALENT FOR LASIX 90 tablet 1  ? latanoprost (XALATAN) 0.005 % ophthalmic solution Place 1 drop into both eyes at bedtime.    ? triamcinolone cream (KENALOG) 0.1 % Apply 1 application topically 4 (four) times daily. On rash 450 g 1  ? valsartan (DIOVAN) 40 MG tablet Take 0.5 tablets (20 mg total) by mouth  2 (two) times daily. 90 tablet 3  ? warfarin (COUMADIN) 5 MG tablet TAKE 1 TABLET BY MOUTH AS DIRECTED BY THE COUMADIN CLINIC (Patient taking differently: Take 2.5-5 mg by mouth See admin instructions. Take 2.'5mg'$  by mouth daily on Monday and Friday, then take '5mg'$  all other days or as directed by the Coumadin clinic) 100 tablet 1  ? ?No facility-administered medications prior to visit.  ? ? ?ROS: ?Review of Systems  ?Constitutional:  Positive for fatigue. Negative for appetite change and unexpected weight change.  ?HENT:  Negative for congestion, nosebleeds, sneezing, sore throat and trouble swallowing.   ?Eyes:  Negative for itching and visual disturbance.  ?Respiratory:  Negative for cough.   ?Cardiovascular:  Negative for chest pain, palpitations and leg swelling.  ?Gastrointestinal:  Negative for abdominal distention, blood in stool, diarrhea and nausea.  ?Genitourinary:  Negative for frequency and hematuria.  ?Musculoskeletal:  Positive for arthralgias. Negative for back pain, gait problem, joint swelling and neck pain.  ?Skin:  Negative for rash.  ?Neurological:  Negative for dizziness, tremors, speech difficulty and weakness.  ?Psychiatric/Behavioral:  Negative for agitation, dysphoric mood and sleep disturbance. The patient is not nervous/anxious.   ? ?Objective:  ?BP 136/62   Pulse 67   Temp 97.7 ?F (36.5 ?C) (Oral)   Ht '5\' 9"'$  (1.753 m)  Wt 152 lb 2 oz (69 kg)   SpO2 99%   BMI 22.46 kg/m?  ? ?BP Readings from Last 3 Encounters:  ?06/14/21 136/62  ?05/16/21 130/80  ?04/24/21 134/76  ? ? ?Wt Readings from Last 3 Encounters:  ?06/14/21 152 lb 2 oz (69 kg)  ?05/16/21 152 lb 6.4 oz (69.1 kg)  ?04/24/21 156 lb 3.2 oz (70.9 kg)  ? ? ?Physical Exam ?Constitutional:   ?   General: He is not in acute distress. ?   Appearance: Normal appearance. He is well-developed.  ?   Comments: NAD  ?Eyes:  ?   Conjunctiva/sclera: Conjunctivae normal.  ?   Pupils: Pupils are equal, round, and reactive to light.  ?Neck:  ?    Thyroid: No thyromegaly.  ?   Vascular: No JVD.  ?Cardiovascular:  ?   Rate and Rhythm: Normal rate and regular rhythm.  ?   Heart sounds: Normal heart sounds. No murmur heard. ?  No friction rub. No gallop.  ?Pulmonary:  ?   Effort: Pulmonary effort is normal. No respiratory distress.  ?   Breath sounds: Normal breath sounds. No wheezing or rales.  ?Chest:  ?   Chest wall: No tenderness.  ?Abdominal:  ?   General: Bowel sounds are normal. There is no distension.  ?   Palpations: Abdomen is soft. There is no mass.  ?   Tenderness: There is no abdominal tenderness. There is no guarding or rebound.  ?Musculoskeletal:     ?   General: No tenderness. Normal range of motion.  ?   Cervical back: Normal range of motion.  ?Lymphadenopathy:  ?   Cervical: No cervical adenopathy.  ?Skin: ?   General: Skin is warm and dry.  ?   Findings: No rash.  ?Neurological:  ?   Mental Status: He is alert and oriented to person, place, and time.  ?   Cranial Nerves: No cranial nerve deficit.  ?   Motor: No abnormal muscle tone.  ?   Coordination: Coordination normal.  ?   Gait: Gait normal.  ?   Deep Tendon Reflexes: Reflexes are normal and symmetric.  ?Psychiatric:     ?   Behavior: Behavior normal.     ?   Thought Content: Thought content normal.     ?   Judgment: Judgment normal.  ?Hearing aids are in ?Looks better ? ?Lab Results  ?Component Value Date  ? WBC 4.2 03/15/2021  ? HGB 10.5 (L) 03/15/2021  ? HCT 33.0 (L) 03/15/2021  ? PLT 250.0 03/15/2021  ? GLUCOSE 104 (H) 04/18/2021  ? CHOL 153 05/01/2021  ? TRIG 73 05/01/2021  ? HDL 66 05/01/2021  ? [place] 73 05/01/2021  ? ALT 18 04/18/2021  ? AST 34 04/18/2021  ? NA 137 04/18/2021  ? K 4.2 04/18/2021  ? CL 103 04/18/2021  ? CREATININE 0.87 04/18/2021  ? BUN 31 (H) 04/18/2021  ? CO2 27 04/18/2021  ? TSH 6.199 (H) 02/04/2021  ? PSA 0.87 10/20/2014  ? INR 2.9 06/02/2021  ? HGBA1C 4.9 02/04/2021  ? ? ?ECHOCARDIOGRAM COMPLETE ? ?Result Date: 02/27/2021 ?   ECHOCARDIOGRAM REPORT   Patient  Name:   KORTEZ MURTAGH Date of Exam: 02/27/2021 Medical Rec #:  [id number]         Height:       69.0 in Accession #:    [id number]        Weight:       167.6 lb Date of  Birth:  1933-09-20         BSA:          1.917 m? Patient Age:    85 years          BP:           124/56 mmHg Patient Gender: M                 HR:           69 bpm. Exam Location:  Inpatient Procedure: 2D Echo, 3D Echo, Cardiac Doppler and Color Doppler Indications:     I50.40* Unspecified combined systolic (congestive) and                  diastolic (congestive) heart failure  History:         Patient has prior history of Echocardiogram examinations, most                  recent 03/09/2019. CHF, Abnormal ECG, Pulmonary HTN and TIA,                  Mitral Valve Disease, Arrythmias:Atrial Fibrillation and NSVT,                  Signs/Symptoms:Chest Pain and Syncope; Risk                  Factors:Hypertension and Diabetes. Mechanical mitral valve.                   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 Procedure Date: 32.  Sonographer:     Roseanna Rainbow RDCS Referring Phys:  [id number] [place] Diagnosing Phys: Oswaldo Milian MD  Sonographer Comments: Technically difficult study due to poor echo windows. IMPRESSIONS  1. Left ventricular ejection fraction, by estimation, is 40 to 45%. The left ventricle has mildly decreased function. The left ventricle demonstrates regional wall motion abnormalities (see scoring diagram/findings for description). Septal hypokinesis. There is moderate left ventricular hypertrophy. Left ventricular diastolic parameters are indeterminate.  2. Right ventricular systolic function is moderately reduced. The right ventricular size is moderately enlarged. There is moderately elevated pulmonary artery systolic pressure. The estimated right ventricular systolic pressure is 63.8 mmHg.  3. Left atrial size was severely dilated.  4. Right atrial  size was severely dilated.  5. 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 Procedure Date: 27. Trivial mitral valve regurgitation.     MG 61mHg at 63bpm, EOA 2.4 cm^2  6. The tricus

## 2021-06-16 ENCOUNTER — Other Ambulatory Visit: Payer: Self-pay

## 2021-06-16 ENCOUNTER — Ambulatory Visit: Payer: Medicare Other

## 2021-06-16 DIAGNOSIS — Z9889 Other specified postprocedural states: Secondary | ICD-10-CM

## 2021-06-16 DIAGNOSIS — I6350 Cerebral infarction due to unspecified occlusion or stenosis of unspecified cerebral artery: Secondary | ICD-10-CM

## 2021-06-16 DIAGNOSIS — I059 Rheumatic mitral valve disease, unspecified: Secondary | ICD-10-CM

## 2021-06-16 DIAGNOSIS — G459 Transient cerebral ischemic attack, unspecified: Secondary | ICD-10-CM

## 2021-06-16 DIAGNOSIS — I480 Paroxysmal atrial fibrillation: Secondary | ICD-10-CM

## 2021-06-16 DIAGNOSIS — Z7901 Long term (current) use of anticoagulants: Secondary | ICD-10-CM

## 2021-06-16 DIAGNOSIS — I4891 Unspecified atrial fibrillation: Secondary | ICD-10-CM

## 2021-06-16 LAB — POCT INR: INR: 3 (ref 2.0–3.0)

## 2021-06-16 NOTE — Patient Instructions (Signed)
-   continue taking 1.5 tablets daily except 1 tablet on Tuesdays.   ?- Recheck INR in 3 weeks.  ?Coumadin Clinic [phone]. Be consistent with Ensure (1 daily).  ?

## 2021-07-07 ENCOUNTER — Other Ambulatory Visit: Payer: Self-pay

## 2021-07-07 ENCOUNTER — Ambulatory Visit: Payer: Medicare Other | Admitting: *Deleted

## 2021-07-07 DIAGNOSIS — Z9889 Other specified postprocedural states: Secondary | ICD-10-CM | POA: Diagnosis not present

## 2021-07-07 DIAGNOSIS — I6350 Cerebral infarction due to unspecified occlusion or stenosis of unspecified cerebral artery: Secondary | ICD-10-CM | POA: Diagnosis not present

## 2021-07-07 DIAGNOSIS — I059 Rheumatic mitral valve disease, unspecified: Secondary | ICD-10-CM | POA: Diagnosis not present

## 2021-07-07 DIAGNOSIS — Z7901 Long term (current) use of anticoagulants: Secondary | ICD-10-CM

## 2021-07-07 DIAGNOSIS — Z5181 Encounter for therapeutic drug level monitoring: Secondary | ICD-10-CM

## 2021-07-07 DIAGNOSIS — I4891 Unspecified atrial fibrillation: Secondary | ICD-10-CM | POA: Diagnosis not present

## 2021-07-07 LAB — POCT INR: INR: 3.3 — AB (ref 2.0–3.0)

## 2021-07-07 MED ORDER — WARFARIN SODIUM 5 MG PO TABS: ORAL_TABLET | ORAL | 0 refills | Status: DC

## 2021-07-07 NOTE — Patient Instructions (Addendum)
Description   ?-Continue to take Warfarin 1.5 tablets daily except for 1 tablet on Tuesdays.  ?- Recheck INR in 1 week.  ?Coumadin Clinic [phone].  ?-Pt is going to stop/wean down on how much ensure he is drinking (07/07/2021) ?  ?  ?

## 2021-07-07 NOTE — Telephone Encounter (Signed)
Prescription refill request received for warfarin ?[facility] Form 04/24/2021  ?Next INR check: 4/24 ?Warfarin tablet strength: '5mg'$   ?

## 2021-07-11 ENCOUNTER — Telehealth: Payer: Self-pay | Admitting: Internal Medicine

## 2021-07-11 MED ORDER — ALLOPURINOL 100 MG PO TABS: 100.0000 mg | ORAL_TABLET | Freq: Every day | ORAL | 3 refills | Status: DC

## 2021-07-11 NOTE — Telephone Encounter (Signed)
1.Medication Requested: allopurinol (ZYLOPRIM) 100 MG tablet ? ?2. Pharmacy (Name, Street, [place]): [id number] - [place], [address] AT [address] ? ?3. On Med List: Y ? ?4. Last Visit with PCP: 06-14-2021 ? ?5. Next visit date with PCP: 07-26-2021 ? ? ?*Pt requesting 90 day supply w/ 3 refills* ?

## 2021-07-11 NOTE — Telephone Encounter (Signed)
Notified pt rx sent to [facility].../lmb 

## 2021-07-19 ENCOUNTER — Ambulatory Visit: Payer: Medicare Other

## 2021-07-19 DIAGNOSIS — I6350 Cerebral infarction due to unspecified occlusion or stenosis of unspecified cerebral artery: Secondary | ICD-10-CM | POA: Diagnosis not present

## 2021-07-19 DIAGNOSIS — Z9889 Other specified postprocedural states: Secondary | ICD-10-CM | POA: Diagnosis not present

## 2021-07-19 DIAGNOSIS — I059 Rheumatic mitral valve disease, unspecified: Secondary | ICD-10-CM | POA: Diagnosis not present

## 2021-07-19 DIAGNOSIS — I4891 Unspecified atrial fibrillation: Secondary | ICD-10-CM

## 2021-07-19 DIAGNOSIS — Z7901 Long term (current) use of anticoagulants: Secondary | ICD-10-CM

## 2021-07-19 DIAGNOSIS — I480 Paroxysmal atrial fibrillation: Secondary | ICD-10-CM | POA: Diagnosis not present

## 2021-07-19 LAB — POCT INR: INR: 3.8 — AB (ref 2.0–3.0)

## 2021-07-19 NOTE — Patient Instructions (Addendum)
?   ?  Description   ?- START taking Warfarin 1 tablet daily except for 1.5 tablet on Mondays, Wednesdays, and Fridays ?- Recheck INR in 10 days.  ?Coumadin Clinic [phone].  ?-Pt is STOPPING Ensures  ?  ?   ?

## 2021-07-21 ENCOUNTER — Other Ambulatory Visit (INDEPENDENT_AMBULATORY_CARE_PROVIDER_SITE_OTHER): Payer: Medicare Other

## 2021-07-21 DIAGNOSIS — E1159 Type 2 diabetes mellitus with other circulatory complications: Secondary | ICD-10-CM | POA: Diagnosis not present

## 2021-07-21 DIAGNOSIS — Z7901 Long term (current) use of anticoagulants: Secondary | ICD-10-CM

## 2021-07-21 DIAGNOSIS — I2510 Atherosclerotic heart disease of native coronary artery without angina pectoris: Secondary | ICD-10-CM | POA: Diagnosis not present

## 2021-07-21 LAB — CBC WITH DIFFERENTIAL/PLATELET
Basophils Absolute: 0.1 10*3/uL (ref 0.0–0.1)
Basophils Relative: 0.9 % (ref 0.0–3.0)
Eosinophils Absolute: 0.9 10*3/uL — ABNORMAL HIGH (ref 0.0–0.7)
Eosinophils Relative: 14.2 % — ABNORMAL HIGH (ref 0.0–5.0)
HCT: 36.4 % — ABNORMAL LOW (ref 39.0–52.0)
Hemoglobin: 12.1 g/dL — ABNORMAL LOW (ref 13.0–17.0)
Lymphocytes Relative: 28.2 % (ref 12.0–46.0)
Lymphs Abs: 1.7 10*3/uL (ref 0.7–4.0)
MCHC: 33.1 g/dL (ref 30.0–36.0)
MCV: 92.7 fl (ref 78.0–100.0)
Monocytes Absolute: 0.5 10*3/uL (ref 0.1–1.0)
Monocytes Relative: 7.8 % (ref 3.0–12.0)
Neutro Abs: 3 10*3/uL (ref 1.4–7.7)
Neutrophils Relative %: 48.9 % (ref 43.0–77.0)
Platelets: 167 10*3/uL (ref 150.0–400.0)
RBC: 3.93 Mil/uL — ABNORMAL LOW (ref 4.22–5.81)
RDW: 19.2 % — ABNORMAL HIGH (ref 11.5–15.5)
WBC: 6.2 10*3/uL (ref 4.0–10.5)

## 2021-07-21 LAB — COMPREHENSIVE METABOLIC PANEL
ALT: 20 U/L (ref 0–53)
AST: 28 U/L (ref 0–37)
Albumin: 4.1 g/dL (ref 3.5–5.2)
Alkaline Phosphatase: 74 U/L (ref 39–117)
BUN: 44 mg/dL — ABNORMAL HIGH (ref 6–23)
CO2: 26 mEq/L (ref 19–32)
Calcium: 9.2 mg/dL (ref 8.4–10.5)
Chloride: 105 mEq/L (ref 96–112)
Creatinine, Ser: 1.21 mg/dL (ref 0.40–1.50)
GFR: 54.39 mL/min — ABNORMAL LOW (ref >60.00)
Glucose, Bld: 93 mg/dL (ref 70–99)
Potassium: 4.2 mEq/L (ref 3.5–5.1)
Sodium: 137 mEq/L (ref 135–145)
Total Bilirubin: 0.8 mg/dL (ref 0.2–1.2)
Total Protein: 7 g/dL (ref 6.0–8.3)

## 2021-07-26 ENCOUNTER — Ambulatory Visit: Payer: Medicare Other | Admitting: Internal Medicine

## 2021-07-26 ENCOUNTER — Encounter: Payer: Self-pay | Admitting: Internal Medicine

## 2021-07-26 DIAGNOSIS — N183 Chronic kidney disease, stage 3 unspecified: Secondary | ICD-10-CM | POA: Diagnosis not present

## 2021-07-26 DIAGNOSIS — E1159 Type 2 diabetes mellitus with other circulatory complications: Secondary | ICD-10-CM

## 2021-07-26 DIAGNOSIS — I480 Paroxysmal atrial fibrillation: Secondary | ICD-10-CM | POA: Diagnosis not present

## 2021-07-26 DIAGNOSIS — I2510 Atherosclerotic heart disease of native coronary artery without angina pectoris: Secondary | ICD-10-CM

## 2021-07-26 DIAGNOSIS — E441 Mild protein-calorie malnutrition: Secondary | ICD-10-CM

## 2021-07-26 NOTE — Assessment & Plan Note (Signed)
Cont w/good hydration ?No NSAIDs ?

## 2021-07-26 NOTE — Assessment & Plan Note (Signed)
ON DIET ASA, Lipitor 

## 2021-07-26 NOTE — Progress Notes (Signed)
? ?Subjective:  ?Patient ID: Anthony Gould, male    DOB: December 23, 1933  Age: 88 y.o. MRN: [id number] ? ?CC: No chief complaint on file. ? ? ?HPI ?Anthony Gould presents for diarrhea - stopped, dyslipidemia, gout, wt loss ?Anthony Gould gained 7 lbs ?C/o more bruising  ? ?Outpatient Medications Prior to Visit  ?Medication Sig Dispense Refill  ? acetaminophen (TYLENOL) 500 MG tablet Take 500 mg by mouth every 6 (six) hours as needed for fever.    ? allopurinol (ZYLOPRIM) 100 MG tablet Take 1 tablet (100 mg total) by mouth daily. 90 tablet 3  ? aspirin 81 MG EC tablet Take 81 mg by mouth daily.    ? atorvastatin (LIPITOR) 20 MG tablet TAKE 1 TABLET BY MOUTH TWO TIMES A WEEK (Patient taking differently: Take 20 mg by mouth 2 (two) times a week. on Monday and Friday night) 26 tablet 3  ? bisoprolol (ZEBETA) 5 MG tablet Take 1 tablet (5 mg total) by mouth daily. 90 tablet 3  ? Cholecalciferol (VITAMIN D3) 50 MCG (2000 UT) capsule TAKE 1 CAPSULE BY MOUTH EVERY DAY (Patient taking differently: Take 2,000 Units by mouth daily.) 100 capsule 3  ? diphenhydrAMINE (BENADRYL ALLERGY) 25 MG tablet Take 1 tablet (25 mg total) by mouth every 6 (six) hours as needed for itching (rash). 60 tablet 1  ? dipyridamole (PERSANTINE) 50 MG tablet Take 1 tablet (50 mg total) by mouth 3 (three) times daily. 270 tablet 3  ? feeding supplement (ENSURE ENLIVE / ENSURE PLUS) LIQD Take 237 mLs by mouth 2 (two) times daily between meals. 5000 mL 0  ? ferrous [id number] C-folic acid (TRINSICON / FOLTRIN) capsule Take 1 capsule by mouth 2 (two) times daily after a meal. (Patient taking differently: Take 1 capsule by mouth daily.) 60 capsule 1  ? furosemide (LASIX) 40 MG tablet TAKE 1 TABLET BY MOUTH DAILY. GENERIC EQUIVALENT FOR LASIX 90 tablet 1  ? latanoprost (XALATAN) 0.005 % ophthalmic solution Place 1 drop into both eyes at bedtime.    ? triamcinolone cream (KENALOG) 0.1 % Apply 1 application topically 4 (four) times daily. On rash 450 g 1  ?  valsartan (DIOVAN) 40 MG tablet Take 0.5 tablets (20 mg total) by mouth 2 (two) times daily. 90 tablet 3  ? warfarin (COUMADIN) 5 MG tablet Take 1 to 1&1/2 tablets by mouth daily as directed by the coumadin clinic. 130 tablet 0  ? ?No facility-administered medications prior to visit.  ? ? ?ROS: ?Review of Systems  ?Constitutional:  Positive for fatigue. Negative for appetite change and unexpected weight change.  ?HENT:  Negative for congestion, nosebleeds, sneezing, sore throat and trouble swallowing.   ?Eyes:  Negative for itching and visual disturbance.  ?Respiratory:  Negative for cough.   ?Cardiovascular:  Negative for chest pain, palpitations and leg swelling.  ?Gastrointestinal:  Negative for abdominal distention, blood in stool, diarrhea and nausea.  ?Genitourinary:  Negative for frequency and hematuria.  ?Musculoskeletal:  Positive for arthralgias and gait problem. Negative for back pain, joint swelling and neck pain.  ?Skin:  Negative for rash.  ?Neurological:  Negative for dizziness, tremors, speech difficulty and weakness.  ?Hematological:  Bruises/bleeds easily.  ?Psychiatric/Behavioral:  Negative for agitation, dysphoric mood and sleep disturbance. The patient is not nervous/anxious.   ?Multiple bruises ? ?Objective:  ?BP 98/60 (BP Location: Left Arm, Patient Position: Sitting, Cuff Size: Normal)   Pulse 76   Temp 97.8 ?F (36.6 ?C) (Oral)   Ht '5\' 9"'$  (1.753 m)  Wt 155 lb (70.3 kg)   SpO2 98%   BMI 22.89 kg/m?  ? ?BP Readings from Last 3 Encounters:  ?07/26/21 98/60  ?06/14/21 136/62  ?05/16/21 130/80  ? ? ?Wt Readings from Last 3 Encounters:  ?07/26/21 155 lb (70.3 kg)  ?06/14/21 152 lb 2 oz (69 kg)  ?05/16/21 152 lb 6.4 oz (69.1 kg)  ? ? ?Physical Exam ?Constitutional:   ?   General: He is not in acute distress. ?   Appearance: He is well-developed.  ?   Comments: NAD  ?HENT:  ?   Nose: No congestion.  ?Eyes:  ?   Conjunctiva/sclera: Conjunctivae normal.  ?   Pupils: Pupils are equal, round, and  reactive to light.  ?Neck:  ?   Thyroid: No thyromegaly.  ?   Vascular: No JVD.  ?Cardiovascular:  ?   Rate and Rhythm: Normal rate and regular rhythm.  ?   Heart sounds: Normal heart sounds. No murmur heard. ?  No friction rub. No gallop.  ?Pulmonary:  ?   Effort: Pulmonary effort is normal. No respiratory distress.  ?   Breath sounds: Normal breath sounds. No wheezing or rales.  ?Chest:  ?   Chest wall: No tenderness.  ?Abdominal:  ?   General: Bowel sounds are normal. There is no distension.  ?   Palpations: Abdomen is soft. There is no mass.  ?   Tenderness: There is no abdominal tenderness. There is no guarding or rebound.  ?Musculoskeletal:     ?   General: No tenderness. Normal range of motion.  ?   Cervical back: Normal range of motion.  ?Lymphadenopathy:  ?   Cervical: No cervical adenopathy.  ?Skin: ?   General: Skin is warm and dry.  ?   Findings: Bruising present. No rash.  ?Neurological:  ?   Mental Status: He is alert and oriented to person, place, and time.  ?   Cranial Nerves: No cranial nerve deficit.  ?   Motor: No abnormal muscle tone.  ?   Coordination: Coordination normal.  ?   Gait: Gait normal.  ?   Deep Tendon Reflexes: Reflexes are normal and symmetric.  ?Psychiatric:     ?   Behavior: Behavior normal.     ?   Thought Content: Thought content normal.     ?   Judgment: Judgment normal.  ? ?Gait is better ? ? ?Lab Results  ?Component Value Date  ? WBC 6.2 07/21/2021  ? HGB 12.1 (L) 07/21/2021  ? HCT 36.4 (L) 07/21/2021  ? PLT 167.0 07/21/2021  ? GLUCOSE 93 07/21/2021  ? CHOL 153 05/01/2021  ? TRIG 73 05/01/2021  ? HDL 66 05/01/2021  ? [place] 73 05/01/2021  ? ALT 20 07/21/2021  ? AST 28 07/21/2021  ? NA 137 07/21/2021  ? K 4.2 07/21/2021  ? CL 105 07/21/2021  ? CREATININE 1.21 07/21/2021  ? BUN 44 (H) 07/21/2021  ? CO2 26 07/21/2021  ? TSH 6.199 (H) 02/04/2021  ? PSA 0.87 10/20/2014  ? INR 3.8 (A) 07/19/2021  ? HGBA1C 4.9 02/04/2021  ? ? ?ECHOCARDIOGRAM COMPLETE ? ?Result Date: 02/27/2021 ?    ECHOCARDIOGRAM REPORT   Patient Name:   Anthony Gould Date of Exam: 02/27/2021 Medical Rec #:  [id number]         Height:       69.0 in Accession #:    [id number]        Weight:  167.6 lb Date of Birth:  [birth year]         BSA:          1.917 m? Patient Age:    83 years          BP:           124/56 mmHg Patient Gender: M                 HR:           69 bpm. Exam Location:  Inpatient Procedure: 2D Echo, 3D Echo, Cardiac Doppler and Color Doppler Indications:     I50.40* Unspecified combined systolic (congestive) and                  diastolic (congestive) heart failure  History:         Patient has prior history of Echocardiogram examinations, most                  recent 03/09/2019. CHF, Abnormal ECG, Pulmonary HTN and TIA,                  Mitral Valve Disease, Arrythmias:Atrial Fibrillation and NSVT,                  Signs/Symptoms:Chest Pain and Syncope; Risk                  Factors:Hypertension and Diabetes. Mechanical mitral valve.                   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 Procedure Date: 13.  Sonographer:     Roseanna Rainbow RDCS Referring Phys:  [id number] [place] Diagnosing Phys: Oswaldo Milian MD  Sonographer Comments: Technically difficult study due to poor echo windows. IMPRESSIONS  1. Left ventricular ejection fraction, by estimation, is 40 to 45%. The left ventricle has mildly decreased function. The left ventricle demonstrates regional wall motion abnormalities (see scoring diagram/findings for description). Septal hypokinesis. There is moderate left ventricular hypertrophy. Left ventricular diastolic parameters are indeterminate.  2. Right ventricular systolic function is moderately reduced. The right ventricular size is moderately enlarged. There is moderately elevated pulmonary artery systolic pressure. The estimated right ventricular systolic pressure is 53.7 mmHg.  3. Left atrial size was  severely dilated.  4. Right atrial size was severely dilated.  5. 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 Procedure Date: 24. Trivial mitral valve regurgitation.     MG 30mHg at

## 2021-07-26 NOTE — Assessment & Plan Note (Addendum)
Wt Readings from Last 3 Encounters:  ?06/14/21 152 lb 2 oz (69 kg)  ?05/16/21 152 lb 6.4 oz (69.1 kg)  ?04/24/21 156 lb 3.2 oz (70.9 kg)  ?Better -  stable wt ? ?

## 2021-07-26 NOTE — Assessment & Plan Note (Signed)
On ASA,  Lipitor, Bisoprolol  ?

## 2021-07-31 ENCOUNTER — Ambulatory Visit: Payer: Medicare Other | Admitting: *Deleted

## 2021-07-31 DIAGNOSIS — Z7901 Long term (current) use of anticoagulants: Secondary | ICD-10-CM | POA: Diagnosis not present

## 2021-07-31 DIAGNOSIS — I4891 Unspecified atrial fibrillation: Secondary | ICD-10-CM | POA: Diagnosis not present

## 2021-07-31 DIAGNOSIS — G459 Transient cerebral ischemic attack, unspecified: Secondary | ICD-10-CM

## 2021-07-31 DIAGNOSIS — I059 Rheumatic mitral valve disease, unspecified: Secondary | ICD-10-CM

## 2021-07-31 DIAGNOSIS — I6350 Cerebral infarction due to unspecified occlusion or stenosis of unspecified cerebral artery: Secondary | ICD-10-CM | POA: Diagnosis not present

## 2021-07-31 DIAGNOSIS — Z9889 Other specified postprocedural states: Secondary | ICD-10-CM

## 2021-07-31 DIAGNOSIS — I480 Paroxysmal atrial fibrillation: Secondary | ICD-10-CM

## 2021-07-31 LAB — POCT INR: INR: 3.2 — AB (ref 2.0–3.0)

## 2021-07-31 NOTE — Patient Instructions (Addendum)
Description   ?Continue taking Warfarin 1 tablet daily except for 1.5 tablet on Mondays, Wednesdays, and Fridays. Recheck INR in 11 days. Coumadin Clinic [phone].  ?Pt is STOPPED Ensure Nutritional Drink.  ?  ?  ? ?

## 2021-08-08 DIAGNOSIS — T783XXA Angioneurotic edema, initial encounter: Secondary | ICD-10-CM | POA: Diagnosis not present

## 2021-08-08 DIAGNOSIS — E559 Vitamin D deficiency, unspecified: Secondary | ICD-10-CM | POA: Diagnosis not present

## 2021-08-08 DIAGNOSIS — R21 Rash and other nonspecific skin eruption: Secondary | ICD-10-CM | POA: Diagnosis not present

## 2021-08-11 ENCOUNTER — Ambulatory Visit: Payer: Medicare Other

## 2021-08-11 DIAGNOSIS — I4891 Unspecified atrial fibrillation: Secondary | ICD-10-CM

## 2021-08-11 DIAGNOSIS — I059 Rheumatic mitral valve disease, unspecified: Secondary | ICD-10-CM

## 2021-08-11 DIAGNOSIS — Z9889 Other specified postprocedural states: Secondary | ICD-10-CM

## 2021-08-11 DIAGNOSIS — I6350 Cerebral infarction due to unspecified occlusion or stenosis of unspecified cerebral artery: Secondary | ICD-10-CM | POA: Diagnosis not present

## 2021-08-11 DIAGNOSIS — Z7901 Long term (current) use of anticoagulants: Secondary | ICD-10-CM

## 2021-08-11 LAB — POCT INR: INR: 3 (ref 2.0–3.0)

## 2021-08-11 NOTE — Patient Instructions (Signed)
Description   Continue taking Warfarin 1 tablet daily except for 1.5 tablet on Mondays, Wednesdays, and Fridays.  Recheck INR in 3 weeks.  Coumadin Clinic [phone].  Pt is STOPPED Ensure Nutritional Drink.

## 2021-08-16 ENCOUNTER — Telehealth: Payer: Medicare Other

## 2021-08-22 ENCOUNTER — Encounter: Payer: Self-pay | Admitting: *Deleted

## 2021-08-22 ENCOUNTER — Ambulatory Visit (INDEPENDENT_AMBULATORY_CARE_PROVIDER_SITE_OTHER): Payer: Medicare Other | Admitting: *Deleted

## 2021-08-22 DIAGNOSIS — I5032 Chronic diastolic (congestive) heart failure: Secondary | ICD-10-CM

## 2021-08-22 DIAGNOSIS — I10 Essential (primary) hypertension: Secondary | ICD-10-CM

## 2021-08-22 NOTE — Chronic Care Management (AMB) (Addendum)
Chronic Care Management   CCM RN Visit Note  08/22/2021 Name: Anthony Gould  Subjective: Anthony Gould is a 88 y.o. year old male who is a primary care patient of Plotnikov, Evie Lacks, MD. The care management team was consulted for assistance with disease management and care coordination needs.    CCM RN CM case closure  for  case closure  in response to provider referral for case management and/or care coordination services.   Consent to Services:  The patient was given information about Chronic Care Management services, agreed to services, and gave verbal consent prior to initiation of services.  Please see initial visit note for detailed documentation.  Patient agreed to services and verbal consent obtained.   Assessment: Review of patient past medical history, allergies, medications, health status, including review of consultants reports, laboratory and other test data, was performed as part of comprehensive evaluation and provision of chronic care management services.  CCM Care Plan  Allergies  Allergen Reactions   Diltiazem Other (See Comments)    "Bradycardia," per pt; tolerates Amlodipine   Sulfa Antibiotics Rash and Other (See Comments)    Reaction not fully recalled    Sulfonamide Derivatives Rash and Other (See Comments)    Reaction not fully recalled    Outpatient Encounter Medications as of 08/22/2021  Medication Sig   acetaminophen (TYLENOL) 500 MG tablet Take 500 mg by mouth every 6 (six) hours as needed for fever.   allopurinol (ZYLOPRIM) 100 MG tablet Take 1 tablet (100 mg total) by mouth daily.   aspirin 81 MG EC tablet Take 81 mg by mouth daily.   atorvastatin (LIPITOR) 20 MG tablet TAKE 1 TABLET BY MOUTH TWO TIMES A WEEK (Patient taking differently: Take 20 mg by mouth 2 (two) times a week. on Monday and Friday night)   bisoprolol (ZEBETA) 5 MG tablet Take 1 tablet (5 mg total) by mouth daily.   Cholecalciferol (VITAMIN D3)  50 MCG (2000 UT) capsule TAKE 1 CAPSULE BY MOUTH EVERY DAY (Patient taking differently: Take 2,000 Units by mouth daily.)   diphenhydrAMINE (BENADRYL ALLERGY) 25 MG tablet Take 1 tablet (25 mg total) by mouth every 6 (six) hours as needed for itching (rash).   dipyridamole (PERSANTINE) 50 MG tablet Take 1 tablet (50 mg total) by mouth 3 (three) times daily.   feeding supplement (ENSURE ENLIVE / ENSURE PLUS) LIQD Take 237 mLs by mouth 2 (two) times daily between meals.   ferrous [id number] C-folic acid (TRINSICON / FOLTRIN) capsule Take 1 capsule by mouth 2 (two) times daily after a meal. (Patient taking differently: Take 1 capsule by mouth daily.)   furosemide (LASIX) 40 MG tablet TAKE 1 TABLET BY MOUTH DAILY. GENERIC EQUIVALENT FOR LASIX   latanoprost (XALATAN) 0.005 % ophthalmic solution Place 1 drop into both eyes at bedtime.   triamcinolone cream (KENALOG) 0.1 % Apply 1 application topically 4 (four) times daily. On rash   valsartan (DIOVAN) 40 MG tablet Take 0.5 tablets (20 mg total) by mouth 2 (two) times daily.   warfarin (COUMADIN) 5 MG tablet Take 1 to 1&1/2 tablets by mouth daily as directed by the coumadin clinic.   No facility-administered encounter medications on file as of 08/22/2021.   Patient Active Problem List   Diagnosis Date Noted   Rash 05/16/2021   C. difficile diarrhea 03/15/2021   Malnutrition ([place]) 03/15/2021   Fall 03/15/2021   Hypophosphatemia 03/01/2021   Diarrhea 02/28/2021   Biventricular heart failure (  [place]) 02/28/2021   Recurrent colitis due to Clostridium difficile 02/28/2021   Acute on chronic diastolic CHF (congestive heart failure) ([place]) 02/27/2021   Persistent atrial fibrillation ([place]) 02/27/2021   Sepsis ([place]) 02/04/2021   Infection of hematoma of wound 01/17/2021   Appendiceal abscess 01/10/2021   Hyponatremia 12/30/2020   Hypokalemia 12/30/2020   Iron deficiency anemia 12/30/2020   Postoperative infected hematoma due to E. coli 12/29/2020    Supratherapeutic INR 12/29/2020   Anemia 12/28/2020   H/O mitral valve replacement with mechanical valve    Acute appendicitis 12/03/2020   Insomnia 09/27/2020   Grief at loss of child 09/01/2020   Right hip pain 08/10/2020   Elevated TSH 07/07/2020   CRI (chronic renal insufficiency), stage 3 (moderate) ([place]) 04/08/2020   Skin cancer of nose 12/07/2019   Neoplasm of uncertain behavior of skin 10/01/2019   Hypomagnesemia 04/02/2019   Syncope 03/05/2019   Abdominal pain 01/20/2019   Vitamin D deficiency 11/28/2018   Low back pain 11/26/2018   Skin cancer 11/26/2018   Melanoma in situ ([place]) 08/26/2018   Foot lesion 04/23/2018   Hematuria 12/18/2017   Night sweats 12/18/2017   Chronic diastolic heart failure ([place]) 09/16/2016   Food allergy 02/13/2016   Cirrhosis of liver without ascites ([place]) 10/12/2015   Nausea with vomiting 10/12/2015   Emesis 09/20/2015   Aspiration pneumonia ([place]) 09/03/2015   Food poisoning 09/02/2015   Hematemesis 09/02/2015   Cough 09/02/2015   Flank pain, acute 05/02/2015   Actinic keratoses 10/20/2014   NSVT (nonsustained ventricular tachycardia) ([place]) 02/16/2013   Routine health maintenance 02/06/2012   Paroxysmal atrial fibrillation ([place]) 09/06/2011   Long term (current) use of anticoagulants 07/04/2010   Gout 06/27/2010   Mitral valve disorder 06/06/2010   Chest pain 02/28/2010   TOXIC LABYRINTHITIS 08/04/2009   Hearing loss 07/07/2009   DYSPEPSIA, CHRONIC 06/23/2007   DM2 (diabetes mellitus, type 2) ([place]) 02/08/2007   Hyperlipidemia 01/31/2007   Essential hypertension 01/31/2007   Coronary atherosclerosis 01/31/2007   PAF (paroxysmal atrial fibrillation) ([place]) 01/31/2007   Cerebral artery occlusion with cerebral infarction ([place]) 01/31/2007   Transient cerebral ischemia 01/31/2007   MITRAL VALVE REPLACEMENT, HX OF 01/31/2007   Conditions to be addressed/monitored:  CHF and HTN  Care Plan : RN Care Manager Plan of Care  Updates made by  Anthony Royalty, RN since 08/22/2021 12:00 AM     Problem: Chronic Disease Management Needs   Priority: High     Long-Range Goal: Ongoing adherence to established plan of care for long term chronic disease management   Start Date: 03/16/2021  Expected End Date: 03/16/2022  Note:   Current Barriers:  Chronic Disease Management support and education needs related to CHF, HTN, and C-difficile Hard of Hearing Two recent hospitalizations: November 12-20, 2022: Sepsis secondary to C-Difficile December 4-9, 2022: Dehydration secondary to AKI/ ongoing C-Difficile  RNCM Clinical Goal(s):  Patient will demonstrate ongoing health management independence as evidenced by adherence to plan of care for CHF; HTN; C-Difficile        through collaboration with RN Care manager, provider, and care team.   Interventions: 1:1 collaboration with primary care provider regarding development and update of comprehensive plan of care as evidenced by provider attestation and co-signature Inter-disciplinary care team collaboration (see longitudinal plan of care) Evaluation of current treatment plan related to  self management and patient's adherence to plan as established by provider Initial assessment completed 03/16/22 08/22/21- noted from review of EHR that patient cancelled our  previously scheduled follow up phone visit on 08/16/21- cancelled on 08/11/21; cancellation notes confirm that patient no longer wishes to participate in CCM program; case closure accordingly- will make PCP aware of same      Plan: No further follow up required: case closure today- patient cancelled scheduled CCM RN CM scheduled phone visit and confirmed when cancelling that he no longer wishes to participate in CCM program PCP made aware of CCM RN CM case closure today CCM RN CM will be happy to re-engage with patient as indicated in future after PCP conversation with patient along with new referral for CCM services  Anthony Rack, RN, BSN, [place] [phone]: direct office    Medical screening examination/treatment/procedure(s) were performed by non-physician practitioner and as supervising physician I was immediately available for consultation/collaboration.  I agree with above. Lew Dawes, MD

## 2021-08-23 DIAGNOSIS — I10 Essential (primary) hypertension: Secondary | ICD-10-CM

## 2021-08-23 DIAGNOSIS — I509 Heart failure, unspecified: Secondary | ICD-10-CM

## 2021-09-01 ENCOUNTER — Ambulatory Visit: Payer: Medicare Other | Admitting: *Deleted

## 2021-09-01 DIAGNOSIS — I480 Paroxysmal atrial fibrillation: Secondary | ICD-10-CM | POA: Diagnosis not present

## 2021-09-01 DIAGNOSIS — Z7901 Long term (current) use of anticoagulants: Secondary | ICD-10-CM | POA: Diagnosis not present

## 2021-09-01 DIAGNOSIS — Z9889 Other specified postprocedural states: Secondary | ICD-10-CM

## 2021-09-01 DIAGNOSIS — G459 Transient cerebral ischemic attack, unspecified: Secondary | ICD-10-CM

## 2021-09-01 DIAGNOSIS — I4891 Unspecified atrial fibrillation: Secondary | ICD-10-CM

## 2021-09-01 DIAGNOSIS — I6350 Cerebral infarction due to unspecified occlusion or stenosis of unspecified cerebral artery: Secondary | ICD-10-CM | POA: Diagnosis not present

## 2021-09-01 DIAGNOSIS — I059 Rheumatic mitral valve disease, unspecified: Secondary | ICD-10-CM

## 2021-09-01 LAB — POCT INR: INR: 3.7 — AB (ref 2.0–3.0)

## 2021-09-01 NOTE — Patient Instructions (Signed)
Description   Tomorrow take 1/2 tablet then continue taking Warfarin 1 tablet daily except for 1.5 tablet on Mondays, Wednesdays, and Fridays. Recheck INR in 3 weeks. [facility] [phone].  Pt is STOPPED Ensure Nutritional Drink.

## 2021-09-08 ENCOUNTER — Other Ambulatory Visit: Payer: Medicare Other

## 2021-09-08 ENCOUNTER — Other Ambulatory Visit: Payer: Self-pay

## 2021-09-08 ENCOUNTER — Telehealth: Payer: Self-pay | Admitting: Internal Medicine

## 2021-09-08 DIAGNOSIS — A09 Infectious gastroenteritis and colitis, unspecified: Secondary | ICD-10-CM | POA: Diagnosis not present

## 2021-09-08 NOTE — Telephone Encounter (Signed)
Anthony Gould can use Imodium as needed Bring a stool specimens for C. difficile test if she continues to have diarrhea.  Thanks

## 2021-09-08 NOTE — Telephone Encounter (Signed)
Contacted patient with dr.plot recommendations use Imodium as needed Bring a stool specimens for C. difficile test if she continues to have diarrhea.  Thanks.  Pt agree  [employer]

## 2021-09-08 NOTE — Telephone Encounter (Signed)
Pt is reporting a change in bowels,patient stated bowels have been looser than in the past. Pt states its not diarrhea,feels normal not sick just concerned.pt states he is going more frequently-4-5 times daily normally-2-3  Pt is asking if he needs to do anything about bowel movement changes.  Feet,ankles,legs are swelling,taking 1.5 lasix  pt reports 13 lb increase since last ov  Pt is asking if he needs to do anything about bowel movement changes and weight gain.  Please advise

## 2021-09-10 LAB — CLOSTRIDIUM DIFFICILE EIA: C difficile Toxins A+B, EIA: NEGATIVE

## 2021-09-12 ENCOUNTER — Other Ambulatory Visit: Payer: Self-pay | Admitting: *Deleted

## 2021-09-12 DIAGNOSIS — I480 Paroxysmal atrial fibrillation: Secondary | ICD-10-CM

## 2021-09-12 MED ORDER — WARFARIN SODIUM 5 MG PO TABS: ORAL_TABLET | ORAL | 1 refills | Status: DC

## 2021-09-21 ENCOUNTER — Other Ambulatory Visit (INDEPENDENT_AMBULATORY_CARE_PROVIDER_SITE_OTHER): Payer: Medicare Other

## 2021-09-21 DIAGNOSIS — I480 Paroxysmal atrial fibrillation: Secondary | ICD-10-CM | POA: Diagnosis not present

## 2021-09-21 DIAGNOSIS — E1159 Type 2 diabetes mellitus with other circulatory complications: Secondary | ICD-10-CM | POA: Diagnosis not present

## 2021-09-21 LAB — CBC WITH DIFFERENTIAL/PLATELET
Basophils Absolute: 0 10*3/uL (ref 0.0–0.1)
Basophils Relative: 0.8 % (ref 0.0–3.0)
Eosinophils Absolute: 0.6 10*3/uL (ref 0.0–0.7)
Eosinophils Relative: 11.1 % — ABNORMAL HIGH (ref 0.0–5.0)
HCT: 33 % — ABNORMAL LOW (ref 39.0–52.0)
Hemoglobin: 11.1 g/dL — ABNORMAL LOW (ref 13.0–17.0)
Lymphocytes Relative: 21.5 % (ref 12.0–46.0)
Lymphs Abs: 1.1 10*3/uL (ref 0.7–4.0)
MCHC: 33.6 g/dL (ref 30.0–36.0)
MCV: 97.4 fl (ref 78.0–100.0)
Monocytes Absolute: 0.5 10*3/uL (ref 0.1–1.0)
Monocytes Relative: 8.7 % (ref 3.0–12.0)
Neutro Abs: 3.1 10*3/uL (ref 1.4–7.7)
Neutrophils Relative %: 57.9 % (ref 43.0–77.0)
Platelets: 146 10*3/uL — ABNORMAL LOW (ref 150.0–400.0)
RBC: 3.39 Mil/uL — ABNORMAL LOW (ref 4.22–5.81)
RDW: 16.1 % — ABNORMAL HIGH (ref 11.5–15.5)
WBC: 5.3 10*3/uL (ref 4.0–10.5)

## 2021-09-21 LAB — COMPREHENSIVE METABOLIC PANEL
ALT: 11 U/L (ref 0–53)
AST: 25 U/L (ref 0–37)
Albumin: 4.1 g/dL (ref 3.5–5.2)
Alkaline Phosphatase: 73 U/L (ref 39–117)
BUN: 28 mg/dL — ABNORMAL HIGH (ref 6–23)
CO2: 25 mEq/L (ref 19–32)
Calcium: 9.1 mg/dL (ref 8.4–10.5)
Chloride: 98 mEq/L (ref 96–112)
Creatinine, Ser: 1.44 mg/dL (ref 0.40–1.50)
GFR: 44.09 mL/min — ABNORMAL LOW (ref >60.00)
Glucose, Bld: 95 mg/dL (ref 70–99)
Potassium: 3.7 mEq/L (ref 3.5–5.1)
Sodium: 132 mEq/L — ABNORMAL LOW (ref 135–145)
Total Bilirubin: 1.2 mg/dL (ref 0.2–1.2)
Total Protein: 6.9 g/dL (ref 6.0–8.3)

## 2021-09-22 ENCOUNTER — Ambulatory Visit: Payer: Medicare Other | Admitting: *Deleted

## 2021-09-22 DIAGNOSIS — Z9889 Other specified postprocedural states: Secondary | ICD-10-CM | POA: Diagnosis not present

## 2021-09-22 DIAGNOSIS — I480 Paroxysmal atrial fibrillation: Secondary | ICD-10-CM | POA: Diagnosis not present

## 2021-09-22 DIAGNOSIS — I6350 Cerebral infarction due to unspecified occlusion or stenosis of unspecified cerebral artery: Secondary | ICD-10-CM | POA: Diagnosis not present

## 2021-09-22 DIAGNOSIS — G459 Transient cerebral ischemic attack, unspecified: Secondary | ICD-10-CM

## 2021-09-22 DIAGNOSIS — Z7901 Long term (current) use of anticoagulants: Secondary | ICD-10-CM | POA: Diagnosis not present

## 2021-09-22 DIAGNOSIS — I059 Rheumatic mitral valve disease, unspecified: Secondary | ICD-10-CM

## 2021-09-22 DIAGNOSIS — I4891 Unspecified atrial fibrillation: Secondary | ICD-10-CM | POA: Diagnosis not present

## 2021-09-22 LAB — POCT INR: INR: 6.2 — AB (ref 2.0–3.0)

## 2021-09-22 NOTE — Patient Instructions (Addendum)
Description   Do not take any Warfarin Saturday, No Warfarin Sunday then check INR on Monday before taking Warfarin.  Normal dose: Warfarin 1 tablet daily except for 1.5 tablet on Mondays, Wednesdays, and Fridays. [facility]in Clinic [phone] or [phone] Pt is STOPPED Ensure Nutritional Drink.

## 2021-09-25 ENCOUNTER — Ambulatory Visit: Payer: Medicare Other | Admitting: Pharmacist

## 2021-09-25 DIAGNOSIS — I059 Rheumatic mitral valve disease, unspecified: Secondary | ICD-10-CM

## 2021-09-25 DIAGNOSIS — Z9889 Other specified postprocedural states: Secondary | ICD-10-CM

## 2021-09-25 DIAGNOSIS — I4891 Unspecified atrial fibrillation: Secondary | ICD-10-CM

## 2021-09-25 DIAGNOSIS — I480 Paroxysmal atrial fibrillation: Secondary | ICD-10-CM

## 2021-09-25 DIAGNOSIS — Z7901 Long term (current) use of anticoagulants: Secondary | ICD-10-CM

## 2021-09-25 DIAGNOSIS — I6350 Cerebral infarction due to unspecified occlusion or stenosis of unspecified cerebral artery: Secondary | ICD-10-CM

## 2021-09-25 DIAGNOSIS — G459 Transient cerebral ischemic attack, unspecified: Secondary | ICD-10-CM

## 2021-09-25 LAB — POCT INR: INR: 3.6 — AB (ref 2.0–3.0)

## 2021-09-25 NOTE — Patient Instructions (Signed)
Description   Restart warfarin today.  Change schedule to 1.5 tablets in Wednesday and Friday, 1 tablet all there days of the week. Recheck in 2 weeks. [facility] [phone] or [phone] Pt is STOPPED Ensure Nutritional Drink.

## 2021-09-27 ENCOUNTER — Encounter: Payer: Self-pay | Admitting: Internal Medicine

## 2021-09-27 ENCOUNTER — Ambulatory Visit: Payer: Medicare Other | Admitting: Internal Medicine

## 2021-09-27 VITALS — BP 130/86 | HR 47 | Temp 97.9°F | Ht 69.0 in | Wt 151.0 lb

## 2021-09-27 DIAGNOSIS — I5082 Biventricular heart failure: Secondary | ICD-10-CM

## 2021-09-27 DIAGNOSIS — N183 Chronic kidney disease, stage 3 unspecified: Secondary | ICD-10-CM

## 2021-09-27 DIAGNOSIS — R5383 Other fatigue: Secondary | ICD-10-CM | POA: Diagnosis not present

## 2021-09-27 DIAGNOSIS — I10 Essential (primary) hypertension: Secondary | ICD-10-CM

## 2021-09-27 DIAGNOSIS — I480 Paroxysmal atrial fibrillation: Secondary | ICD-10-CM

## 2021-09-27 DIAGNOSIS — E1159 Type 2 diabetes mellitus with other circulatory complications: Secondary | ICD-10-CM | POA: Diagnosis not present

## 2021-09-27 DIAGNOSIS — D500 Iron deficiency anemia secondary to blood loss (chronic): Secondary | ICD-10-CM | POA: Diagnosis not present

## 2021-09-27 MED ORDER — ENTRESTO 24-26 MG PO TABS: 1.0000 | ORAL_TABLET | Freq: Two times a day (BID) | ORAL | 5 refills | Status: DC

## 2021-09-27 NOTE — Assessment & Plan Note (Signed)
BP Readings from Last 3 Encounters:  09/27/21 130/86  07/26/21 98/60  06/14/21 136/62

## 2021-09-27 NOTE — Progress Notes (Signed)
Subjective:  Patient ID: Anthony Gould, male    DOB: 12-05-1933  Age: 88 y.o. MRN: [id number]  CC: No chief complaint on file.   HPI Anthony Gould presents for PAF, wt loss - now wt gain: [name] thinks it is water - ?19 lbs in 2 months C/o some wheezing C/o fatigue on beta blockers - [name] wants to stop bisoprolol  Outpatient Medications Prior to Visit  Medication Sig Dispense Refill   acetaminophen (TYLENOL) 500 MG tablet Take 500 mg by mouth every 6 (six) hours as needed for fever.     allopurinol (ZYLOPRIM) 100 MG tablet Take 1 tablet (100 mg total) by mouth daily. 90 tablet 3   aspirin 81 MG EC tablet Take 81 mg by mouth daily.     bisoprolol (ZEBETA) 5 MG tablet Take 1 tablet (5 mg total) by mouth daily. 90 tablet 3   Cholecalciferol (VITAMIN D3) 50 MCG (2000 UT) capsule TAKE 1 CAPSULE BY MOUTH EVERY DAY (Patient taking differently: Take 2,000 Units by mouth daily.) 100 capsule 3   dipyridamole (PERSANTINE) 50 MG tablet Take 1 tablet (50 mg total) by mouth 3 (three) times daily. 270 tablet 3   escitalopram (LEXAPRO) 5 MG tablet      ferrous [id number] C-folic acid (TRINSICON / FOLTRIN) capsule Take 1 capsule by mouth 2 (two) times daily after a meal. (Patient taking differently: Take 1 capsule by mouth daily.) 60 capsule 1   furosemide (LASIX) 40 MG tablet TAKE 1 TABLET BY MOUTH DAILY. GENERIC EQUIVALENT FOR LASIX 90 tablet 1   HYDROcodone-acetaminophen (NORCO) 7.5-325 MG tablet TAKE 1/2 TO 1 TABLET BY MOUTH EVERY 6 HOURS AS NEEDED FOR MODERATE PAIN     latanoprost (XALATAN) 0.005 % ophthalmic solution Place 1 drop into both eyes at bedtime.     pantoprazole (PROTONIX) 40 MG tablet Take 1 tablet by mouth daily.     triamcinolone cream (KENALOG) 0.1 % Apply 1 application topically 4 (four) times daily. On rash 450 g 1   warfarin (COUMADIN) 5 MG tablet Take 1 to 1&1/2 tablets by mouth daily as directed by the Anticoagulation Clinic. 130 tablet 1   atorvastatin (LIPITOR)  20 MG tablet TAKE 1 TABLET BY MOUTH TWO TIMES A WEEK (Patient taking differently: Take 20 mg by mouth 2 (two) times a week. on Monday and Friday night) 26 tablet 3   diphenhydrAMINE (BENADRYL ALLERGY) 25 MG tablet Take 1 tablet (25 mg total) by mouth every 6 (six) hours as needed for itching (rash). 60 tablet 1   feeding supplement (ENSURE ENLIVE / ENSURE PLUS) LIQD Take 237 mLs by mouth 2 (two) times daily between meals. 5000 mL 0   valsartan (DIOVAN) 40 MG tablet Take 0.5 tablets (20 mg total) by mouth 2 (two) times daily. 90 tablet 3   No facility-administered medications prior to visit.    ROS: Review of Systems  Constitutional:  Negative for appetite change, fatigue and unexpected weight change.  HENT:  Negative for congestion, nosebleeds, sneezing, sore throat and trouble swallowing.   Eyes:  Negative for itching and visual disturbance.  Respiratory:  Positive for shortness of breath and wheezing. Negative for cough.   Cardiovascular:  Positive for leg swelling. Negative for chest pain and palpitations.  Gastrointestinal:  Negative for abdominal distention, blood in stool, diarrhea and nausea.  Genitourinary:  Negative for frequency and hematuria.  Musculoskeletal:  Negative for back pain, gait problem, joint swelling and neck pain.  Skin:  Negative for rash.  Neurological:  Negative for dizziness, tremors, speech difficulty and weakness.  Psychiatric/Behavioral:  Negative for agitation, dysphoric mood and sleep disturbance. The patient is not nervous/anxious.     Objective:  BP 130/86 (BP Location: Left Arm, Patient Position: Sitting, Cuff Size: Normal)   Pulse (!) 47   Temp 97.9 F (36.6 C) (Oral)   Ht '5\' 9"'$  (1.753 m)   Wt 151 lb (68.5 kg)   SpO2 97%   BMI 22.30 kg/m   BP Readings from Last 3 Encounters:  09/27/21 130/86  07/26/21 98/60  06/14/21 136/62    Wt Readings from Last 3 Encounters:  09/27/21 151 lb (68.5 kg)  07/26/21 155 lb (70.3 kg)  06/14/21 152 lb 2 oz  (69 kg)    Physical Exam Constitutional:      General: He is not in acute distress.    Appearance: Normal appearance. He is well-developed.     Comments: NAD  Eyes:     Conjunctiva/sclera: Conjunctivae normal.     Pupils: Pupils are equal, round, and reactive to light.  Neck:     Thyroid: No thyromegaly.     Vascular: No JVD.  Cardiovascular:     Rate and Rhythm: Normal rate. Rhythm irregular.     Heart sounds: Murmur heard.     No friction rub. No gallop.  Pulmonary:     Effort: Pulmonary effort is normal. No respiratory distress.     Breath sounds: Normal breath sounds. No wheezing or rales.  Chest:     Chest wall: No tenderness.  Abdominal:     General: Bowel sounds are normal. There is no distension.     Palpations: Abdomen is soft. There is no mass.     Tenderness: There is no abdominal tenderness. There is no guarding or rebound.  Musculoskeletal:        General: No tenderness. Normal range of motion.     Cervical back: Normal range of motion.     Right lower leg: Edema present.     Left lower leg: Edema present.  Lymphadenopathy:     Cervical: No cervical adenopathy.  Skin:    General: Skin is warm and dry.     Findings: No rash.  Neurological:     Mental Status: He is alert and oriented to person, place, and time.     Cranial Nerves: No cranial nerve deficit.     Motor: No abnormal muscle tone.     Coordination: Coordination normal.     Gait: Gait normal.     Deep Tendon Reflexes: Reflexes are normal and symmetric.  Psychiatric:        Behavior: Behavior normal.        Thought Content: Thought content normal.        Judgment: Judgment normal.   Trace to 1+ edema B  Lab Results  Component Value Date   WBC 5.3 09/21/2021   HGB 11.1 (L) 09/21/2021   HCT 33.0 (L) 09/21/2021   PLT 146.0 (L) 09/21/2021   GLUCOSE 95 09/21/2021   CHOL 153 05/01/2021   TRIG 73 05/01/2021   HDL 66 05/01/2021   LDLCALC 73 05/01/2021   ALT 11 09/21/2021   AST 25 09/21/2021    NA 132 (L) 09/21/2021   K 3.7 09/21/2021   CL 98 09/21/2021   CREATININE 1.44 09/21/2021   BUN 28 (H) 09/21/2021   CO2 25 09/21/2021   TSH 6.199 (H) 02/04/2021   PSA 0.87 10/20/2014   INR 3.6 (A) 09/25/2021  HGBA1C 4.9 02/04/2021    ECHOCARDIOGRAM COMPLETE  Result Date: 02/27/2021    ECHOCARDIOGRAM REPORT   Patient Name:   KADENCE MIMBS Date of Exam: 02/27/2021 Medical Rec #:  [id number]         Height:       69.0 in Accession #:    [id number]        Weight:       167.6 lb Date of Birth:  [birth year]         BSA:          1.917 m Patient Age:    77 years          BP:           124/56 mmHg Patient Gender: M                 HR:           69 bpm. Exam Location:  Inpatient Procedure: 2D Echo, 3D Echo, Cardiac Doppler and Color Doppler Indications:     I50.40* Unspecified combined systolic (congestive) and                  diastolic (congestive) heart failure  History:         Patient has prior history of Echocardiogram examinations, most                  recent 03/09/2019. CHF, Abnormal ECG, Pulmonary HTN and TIA,                  Mitral Valve Disease, Arrythmias:Atrial Fibrillation and NSVT,                  Signs/Symptoms:Chest Pain and Syncope; Risk                  Factors:Hypertension and Diabetes. Mechanical mitral valve.                   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 Procedure Date: 66.  Sonographer:     Roseanna Rainbow RDCS Referring Phys:  [id number] [place] Diagnosing Phys: Oswaldo Milian MD  Sonographer Comments: Technically difficult study due to poor echo windows. IMPRESSIONS  1. Left ventricular ejection fraction, by estimation, is 40 to 45%. The left ventricle has mildly decreased function. The left ventricle demonstrates regional wall motion abnormalities (see scoring diagram/findings for description). Septal hypokinesis. There is moderate left ventricular hypertrophy. Left ventricular diastolic  parameters are indeterminate.  2. Right ventricular systolic function is moderately reduced. The right ventricular size is moderately enlarged. There is moderately elevated pulmonary artery systolic pressure. The estimated right ventricular systolic pressure is 17.4 mmHg.  3. Left atrial size was severely dilated.  4. Right atrial size was severely dilated.  5. 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 Procedure Date: 31. Trivial mitral valve regurgitation.     MG 17mHg at 63bpm, EOA 2.4 cm^2  6. The tricuspid valve is abnormal. Tricuspid valve regurgitation is severe.  7. The aortic valve is tricuspid. Aortic valve regurgitation is trivial. Aortic valve sclerosis is present, with no evidence of aortic valve stenosis.  8. The inferior vena cava is dilated in size with <50% respiratory variability, suggesting right atrial pressure of 15 mmHg. FINDINGS  Left Ventricle: Left ventricular ejection fraction, by estimation, is  40 to 45%. The left ventricle has mildly decreased function. The left ventricle demonstrates regional wall motion abnormalities. The left ventricular internal cavity size was normal in size. There is moderate left ventricular hypertrophy. Left ventricular diastolic parameters are indeterminate.  LV Wall Scoring: The entire septum is hypokinetic. The entire anterior wall, entire lateral wall, entire inferior wall, and apex are normal. Right Ventricle: The right ventricular size is moderately enlarged. Right vetricular wall thickness was not well visualized. Right ventricular systolic function is moderately reduced. There is moderately elevated pulmonary artery systolic pressure. The tricuspid regurgitant velocity is 3.09 m/s, and with an assumed right atrial pressure of 15 mmHg, the estimated right ventricular systolic pressure is 77.4 mmHg. Left Atrium: Left atrial size was severely dilated. Right Atrium: Right atrial size was severely dilated. Pericardium: There is no  evidence of pericardial effusion. Mitral Valve: The mitral valve has been repaired/replaced. Trivial mitral valve regurgitation. There is a unknown size Medtronic tilting disc valve present in the mitral position. Procedure Date: 30. MV peak gradient, 13.5 mmHg. The mean mitral valve gradient is 4.0 mmHg. Tricuspid Valve: The tricuspid valve is abnormal. Tricuspid valve regurgitation is severe. Aortic Valve: The aortic valve is tricuspid. Aortic valve regurgitation is trivial. Aortic valve sclerosis is present, with no evidence of aortic valve stenosis. Pulmonic Valve: The pulmonic valve was not well visualized. Pulmonic valve regurgitation is trivial. Aorta: The aortic root and ascending aorta are structurally normal, with no evidence of dilitation. Venous: The inferior vena cava is dilated in size with less than 50% respiratory variability, suggesting right atrial pressure of 15 mmHg. IAS/Shunts: The interatrial septum was not well visualized.  LEFT VENTRICLE PLAX 2D LVIDd:         5.10 cm      Diastology LVIDs:         4.00 cm      LV e' medial:    7.40 cm/s LV PW:         2.00 cm      LV E/e' medial:  22.2 LV IVS:        0.80 cm      LV e' lateral:   7.29 cm/s LVOT diam:     2.40 cm      LV E/e' lateral: 22.5 LV SV:         97 LV SV Index:   51 LVOT Area:     4.52 cm  LV Volumes (MOD) LV vol d, MOD A2C: 135.0 ml LV vol d, MOD A4C: 105.0 ml LV vol s, MOD A2C: 69.9 ml LV vol s, MOD A4C: 59.0 ml LV SV MOD A2C:     65.1 ml LV SV MOD A4C:     105.0 ml LV SV MOD BP:      53.6 ml RIGHT VENTRICLE            IVC RV S prime:     8.33 cm/s  IVC diam: 2.70 cm TAPSE (M-mode): 1.4 cm LEFT ATRIUM            Index        RIGHT ATRIUM           Index LA diam:      5.80 cm  3.03 cm/m   RA Area:     39.60 cm LA Vol (A2C): 72.1 ml  37.62 ml/m  RA Volume:   166.00 ml 86.62 ml/m LA Vol (A4C): 140.0 ml 73.05 ml/m  AORTIC VALVE  PULMONIC VALVE LVOT Vmax:   104.00 cm/s PR End Diast Vel: 2.22 msec LVOT Vmean:  67.300  cm/s LVOT VTI:    0.215 m  AORTA Ao Root diam: 3.60 cm Ao Asc diam:  3.50 cm MITRAL VALVE                TRICUSPID VALVE MV Area (PHT): 3.48 cm     TR Peak grad:   38.2 mmHg MV Area VTI:   2.61 cm     TR Vmax:        309.00 cm/s MV Peak grad:  13.5 mmHg MV Mean grad:  4.0 mmHg     SHUNTS MV Vmax:       1.84 m/s     Systemic VTI:  0.22 m MV Vmean:      88.9 cm/s    Systemic Diam: 2.40 cm MV Decel Time: 218 msec MV E velocity: 164.00 cm/s Oswaldo Milian MD Electronically signed by Oswaldo Milian MD Signature Date/Time: 02/27/2021/6:41:19 PM    Final (Updated)     Assessment & Plan:   Problem List Items Addressed This Visit     Anemia - Primary   Relevant Orders   Iron, TIBC and Ferritin Panel   Biventricular heart failure (HCC)    C/o fatigue on beta blockers - [name] wants to stop bisoprolol Worse Re-start Entresto. Stop Valsartan Increase Furosemide to 1.5 tab in am if needed F/u w/Cardiology soon - Dr Angelena Form      Relevant Medications   sacubitril-valsartan (ENTRESTO) 24-26 MG   CRI (chronic renal insufficiency), stage 3 (moderate) (HCC)    Hydrate well Monitor GFR      Relevant Orders   Comprehensive metabolic panel   CBC with Differential/Platelet   Iron, TIBC and Ferritin Panel   DM2 (diabetes mellitus, type 2) ([place])    On diet Monitor labs      Relevant Orders   Comprehensive metabolic panel   CBC with Differential/Platelet   Iron, TIBC and Ferritin Panel   Essential hypertension    BP Readings from Last 3 Encounters:  09/27/21 130/86  07/26/21 98/60  06/14/21 136/62        Relevant Medications   sacubitril-valsartan (ENTRESTO) 24-26 MG   Fatigue    C/o fatigue on beta blockers, worse - [name] wants to stop bisoprolol. Ok to hold for a week to see if better      Relevant Orders   Iron, TIBC and Ferritin Panel   PAF (paroxysmal atrial fibrillation) (HCC)    Cont on Coumadin      Relevant Medications   sacubitril-valsartan (ENTRESTO) 24-26 MG    Paroxysmal atrial fibrillation (HCC)    F/u w/Dr Angelena Form      Relevant Medications   sacubitril-valsartan (ENTRESTO) 24-26 MG      Meds ordered this encounter  Medications   sacubitril-valsartan (ENTRESTO) 24-26 MG    Sig: Take 1 tablet by mouth 2 (two) times daily.    Dispense:  60 tablet    Refill:  5      Follow-up: Return in about 6 weeks (around 11/08/2021) for a follow-up visit.  Walker Kehr, MD

## 2021-09-27 NOTE — Assessment & Plan Note (Signed)
F/u w/Dr Angelena Form

## 2021-09-27 NOTE — Assessment & Plan Note (Signed)
C/o fatigue on beta blockers, worse - [name] wants to stop bisoprolol. Ok to hold for a week to see if better

## 2021-09-27 NOTE — Assessment & Plan Note (Addendum)
C/o fatigue on beta blockers - [name] wants to stop bisoprolol Worse Re-start Entresto. Stop Valsartan Increase Furosemide to 1.5 tab in am if needed F/u w/Cardiology soon - Dr Angelena Form

## 2021-09-27 NOTE — Assessment & Plan Note (Signed)
Cont on Coumadin °

## 2021-09-27 NOTE — Assessment & Plan Note (Signed)
On diet Monitor labs

## 2021-09-27 NOTE — Assessment & Plan Note (Signed)
Hydrate well ?Monitor GFR ?

## 2021-10-09 ENCOUNTER — Ambulatory Visit: Payer: Medicare Other

## 2021-10-09 DIAGNOSIS — I4891 Unspecified atrial fibrillation: Secondary | ICD-10-CM | POA: Diagnosis not present

## 2021-10-09 DIAGNOSIS — Z5181 Encounter for therapeutic drug level monitoring: Secondary | ICD-10-CM | POA: Diagnosis not present

## 2021-10-09 DIAGNOSIS — I6350 Cerebral infarction due to unspecified occlusion or stenosis of unspecified cerebral artery: Secondary | ICD-10-CM | POA: Diagnosis not present

## 2021-10-09 DIAGNOSIS — I059 Rheumatic mitral valve disease, unspecified: Secondary | ICD-10-CM

## 2021-10-09 DIAGNOSIS — I480 Paroxysmal atrial fibrillation: Secondary | ICD-10-CM | POA: Diagnosis not present

## 2021-10-09 DIAGNOSIS — Z9889 Other specified postprocedural states: Secondary | ICD-10-CM | POA: Diagnosis not present

## 2021-10-09 DIAGNOSIS — Z7901 Long term (current) use of anticoagulants: Secondary | ICD-10-CM

## 2021-10-09 LAB — POCT INR: INR: 5.5 — AB (ref 2.0–3.0)

## 2021-10-09 NOTE — Patient Instructions (Signed)
HOLD TUESDAY AND WEDNESDAY AND THEN DECREASE TO  1 tablet DAILY. Recheck in 1 week. [facility] [phone] or [phone] Pt is STOPPED Ensure Nutritional Drink.

## 2021-10-12 DIAGNOSIS — T7840XA Allergy, unspecified, initial encounter: Secondary | ICD-10-CM | POA: Diagnosis not present

## 2021-10-12 DIAGNOSIS — R21 Rash and other nonspecific skin eruption: Secondary | ICD-10-CM | POA: Diagnosis not present

## 2021-10-16 ENCOUNTER — Ambulatory Visit: Payer: Medicare Other

## 2021-10-16 DIAGNOSIS — I6350 Cerebral infarction due to unspecified occlusion or stenosis of unspecified cerebral artery: Secondary | ICD-10-CM

## 2021-10-16 DIAGNOSIS — Z9889 Other specified postprocedural states: Secondary | ICD-10-CM

## 2021-10-16 DIAGNOSIS — I059 Rheumatic mitral valve disease, unspecified: Secondary | ICD-10-CM | POA: Diagnosis not present

## 2021-10-16 DIAGNOSIS — I4891 Unspecified atrial fibrillation: Secondary | ICD-10-CM | POA: Diagnosis not present

## 2021-10-16 DIAGNOSIS — Z5181 Encounter for therapeutic drug level monitoring: Secondary | ICD-10-CM

## 2021-10-16 DIAGNOSIS — I480 Paroxysmal atrial fibrillation: Secondary | ICD-10-CM

## 2021-10-16 DIAGNOSIS — Z7901 Long term (current) use of anticoagulants: Secondary | ICD-10-CM

## 2021-10-16 LAB — POCT INR: INR: 2.8 (ref 2.0–3.0)

## 2021-10-16 NOTE — Patient Instructions (Signed)
Continue 1 tablet DAILY. Recheck in 3 weeks. [facility] [phone] or [phone]

## 2021-10-17 DIAGNOSIS — I10 Essential (primary) hypertension: Secondary | ICD-10-CM | POA: Diagnosis not present

## 2021-10-17 DIAGNOSIS — F4323 Adjustment disorder with mixed anxiety and depressed mood: Secondary | ICD-10-CM | POA: Diagnosis not present

## 2021-10-17 DIAGNOSIS — N1831 Chronic kidney disease, stage 3a: Secondary | ICD-10-CM | POA: Diagnosis not present

## 2021-10-17 DIAGNOSIS — R21 Rash and other nonspecific skin eruption: Secondary | ICD-10-CM | POA: Diagnosis not present

## 2021-10-20 ENCOUNTER — Telehealth: Payer: Self-pay | Admitting: Cardiovascular Disease

## 2021-10-20 NOTE — Telephone Encounter (Signed)
Returned call to patient.  He restarted Entresto earlier this month and continues lasix 40 mg daily.  He has been noting increased swelling in his feet and legs each day as the day goes on.  His weight at home is 171.6 and has been increasing about 2-3 pounds per week for the last few weeks.  He is wheezing walking around while on the phone.  I have asked him to increase lasix 40 mg to BID for 3 day and then go back to 40 mg daily. He has done this in the past as directed by Dr. Angelena Form.   He will call Monday if he has not improved.  I was able to move up his 66mfollow up from 8/25 to 8/7 with Dr. MAngelena Form

## 2021-10-20 NOTE — Telephone Encounter (Signed)
Pt c/o Shortness Of Breath: STAT if SOB developed within the last 24 hours or pt is noticeably SOB on the phone  1. Are you currently SOB (can you hear that pt is SOB on the phone)?   No  2. How long have you been experiencing SOB?   Has been ongoing since late June or early July  3. Are you SOB when sitting or when up moving around?  When moving around and when lying in bed on his left side  4. Are you currently experiencing any other symptoms?   Patient stated he has swelling in legs and feet and some wheezing.  Patient stated he has not had weight gain but is currently taking  furosemide (LASIX) 40 MG tablet.

## 2021-10-24 NOTE — Telephone Encounter (Signed)
Pt calling in b/c he had not heard from [facility], he thought she was going to call him to follow up.  Pt informed that according to the note that he was to call us and let us know how things were going. He reports that wheezing is pretty much improved. His weight is down to 170 lb today. His "bags of water under my eyes has improved".   Pt is back to taking Lasix once every morning. Reports that BLEE is not improved.  States "it is unchanged and the way it was before". Reports that BLEE is "swollen back up" by 10:00/11:00 am every morning. Pt aware I will forward to the nurse for review w/ MD tomorrow. Patient verbalized understanding and agreeable to plan.

## 2021-10-24 NOTE — Telephone Encounter (Signed)
Pt is requesting call in regards to the changes made in his medication. He would like clarification on when to stop taking it the way given or how long he is to continue taking it this way. Please advise.

## 2021-10-25 ENCOUNTER — Ambulatory Visit: Payer: Medicare Other | Admitting: Internal Medicine

## 2021-10-25 ENCOUNTER — Encounter: Payer: Self-pay | Admitting: Internal Medicine

## 2021-10-25 ENCOUNTER — Ambulatory Visit (INDEPENDENT_AMBULATORY_CARE_PROVIDER_SITE_OTHER): Payer: Medicare Other

## 2021-10-25 DIAGNOSIS — E1159 Type 2 diabetes mellitus with other circulatory complications: Secondary | ICD-10-CM

## 2021-10-25 DIAGNOSIS — R5383 Other fatigue: Secondary | ICD-10-CM | POA: Diagnosis not present

## 2021-10-25 DIAGNOSIS — M1000 Idiopathic gout, unspecified site: Secondary | ICD-10-CM

## 2021-10-25 DIAGNOSIS — J811 Chronic pulmonary edema: Secondary | ICD-10-CM | POA: Diagnosis not present

## 2021-10-25 DIAGNOSIS — I480 Paroxysmal atrial fibrillation: Secondary | ICD-10-CM

## 2021-10-25 DIAGNOSIS — I5082 Biventricular heart failure: Secondary | ICD-10-CM

## 2021-10-25 DIAGNOSIS — D500 Iron deficiency anemia secondary to blood loss (chronic): Secondary | ICD-10-CM | POA: Diagnosis not present

## 2021-10-25 DIAGNOSIS — N183 Chronic kidney disease, stage 3 unspecified: Secondary | ICD-10-CM | POA: Diagnosis not present

## 2021-10-25 DIAGNOSIS — R0600 Dyspnea, unspecified: Secondary | ICD-10-CM | POA: Diagnosis not present

## 2021-10-25 LAB — COMPREHENSIVE METABOLIC PANEL
ALT: 9 U/L (ref 0–53)
AST: 25 U/L (ref 0–37)
Albumin: 3.9 g/dL (ref 3.5–5.2)
Alkaline Phosphatase: 69 U/L (ref 39–117)
BUN: 22 mg/dL (ref 6–23)
CO2: 29 mEq/L (ref 19–32)
Calcium: 9 mg/dL (ref 8.4–10.5)
Chloride: 99 mEq/L (ref 96–112)
Creatinine, Ser: 1.32 mg/dL (ref 0.40–1.50)
GFR: 48.91 mL/min — ABNORMAL LOW (ref >60.00)
Glucose, Bld: 146 mg/dL — ABNORMAL HIGH (ref 70–99)
Potassium: 3.8 mEq/L (ref 3.5–5.1)
Sodium: 136 mEq/L (ref 135–145)
Total Bilirubin: 1.2 mg/dL (ref 0.2–1.2)
Total Protein: 6.9 g/dL (ref 6.0–8.3)

## 2021-10-25 LAB — CBC WITH DIFFERENTIAL/PLATELET
Basophils Absolute: 0 10*3/uL (ref 0.0–0.1)
Basophils Relative: 1.1 % (ref 0.0–3.0)
Eosinophils Absolute: 0.4 10*3/uL (ref 0.0–0.7)
Eosinophils Relative: 8.9 % — ABNORMAL HIGH (ref 0.0–5.0)
HCT: 33.7 % — ABNORMAL LOW (ref 39.0–52.0)
Hemoglobin: 11.2 g/dL — ABNORMAL LOW (ref 13.0–17.0)
Lymphocytes Relative: 19.2 % (ref 12.0–46.0)
Lymphs Abs: 0.8 10*3/uL (ref 0.7–4.0)
MCHC: 33.1 g/dL (ref 30.0–36.0)
MCV: 98.6 fl (ref 78.0–100.0)
Monocytes Absolute: 0.4 10*3/uL (ref 0.1–1.0)
Monocytes Relative: 9.4 % (ref 3.0–12.0)
Neutro Abs: 2.6 10*3/uL (ref 1.4–7.7)
Neutrophils Relative %: 61.4 % (ref 43.0–77.0)
Platelets: 146 10*3/uL — ABNORMAL LOW (ref 150.0–400.0)
RBC: 3.42 Mil/uL — ABNORMAL LOW (ref 4.22–5.81)
RDW: 16.1 % — ABNORMAL HIGH (ref 11.5–15.5)
WBC: 4.2 10*3/uL (ref 4.0–10.5)

## 2021-10-25 LAB — IRON,TIBC AND FERRITIN PANEL
%SAT: 16 % (calc) — ABNORMAL LOW (ref 20–48)
Ferritin: 30 ng/mL (ref 24–380)
Iron: 56 ug/dL (ref 50–180)
TIBC: 356 mcg/dL (calc) (ref 250–425)

## 2021-10-25 MED ORDER — TORSEMIDE 20 MG PO TABS
20.0000 mg | ORAL_TABLET | Freq: Two times a day (BID) | ORAL | 5 refills | Status: DC
Start: 2021-10-25 — End: 2021-11-13

## 2021-10-25 MED ORDER — ALLOPURINOL 100 MG PO TABS: 50.0000 mg | ORAL_TABLET | Freq: Every day | ORAL | 3 refills | Status: DC

## 2021-10-25 NOTE — Assessment & Plan Note (Addendum)
Worse - coughing and wheezing x 2 weeks. Nonsmoker. No h/o asthma. C/o swelling on the legs - worse C/o DOE, no orthopnea  Start on Torsemide 20 mg bid. D/c Lasix. Monitor GFR, K Will get a CXR Cardiology appt next Monday Reduce salt

## 2021-10-25 NOTE — Telephone Encounter (Addendum)
Left message for patient to call back. Reviewed w Dr. Angelena Form.  He had labs today at PCP.  Creatinine is wnl 1.32 and GFR is 48.91.  He is scheduled to see Dr. Angelena Form on 10/30/21.  Per Dr. Angelena Form, okay for pt to continue furosemide 20 mg BID until seen.    I spoke with the patient and he was seen at PCP today.  He had labs and a CXR today.  Today PCP changed his medication from furosemide 20 mg to torsemide 20 mg BID.

## 2021-10-25 NOTE — Assessment & Plan Note (Signed)
ON DIET ASA, Lipitor

## 2021-10-25 NOTE — Assessment & Plan Note (Signed)
Allopurinol -- reduce to 50 mg/d, no gout attacks

## 2021-10-25 NOTE — Telephone Encounter (Signed)
Pt is returning call. Requesting call back.  

## 2021-10-25 NOTE — Assessment & Plan Note (Signed)
In A fib now 

## 2021-10-25 NOTE — Assessment & Plan Note (Signed)
Monitor GFR, Hgb Treat CHF

## 2021-10-25 NOTE — Progress Notes (Signed)
Subjective:  Patient ID: Anthony Gould, male    DOB: 02-May-1933  Age: 88 y.o. MRN: [id number]  CC: No chief complaint on file.   HPI Anthony Gould presents for coughing and wheezing x 2 weeks. Nonsmoker. No h/o asthma. C/o swelling on the legs - worse C/o DOE, no orthopnea  Outpatient Medications Prior to Visit  Medication Sig Dispense Refill   acetaminophen (TYLENOL) 500 MG tablet Take 500 mg by mouth every 6 (six) hours as needed for fever.     aspirin 81 MG EC tablet Take 81 mg by mouth daily.     bisoprolol (ZEBETA) 5 MG tablet Take 1 tablet (5 mg total) by mouth daily. 90 tablet 3   Cholecalciferol (VITAMIN D3) 50 MCG (2000 UT) capsule TAKE 1 CAPSULE BY MOUTH EVERY DAY (Patient taking differently: Take 2,000 Units by mouth daily.) 100 capsule 3   dipyridamole (PERSANTINE) 50 MG tablet Take 1 tablet (50 mg total) by mouth 3 (three) times daily. 270 tablet 3   escitalopram (LEXAPRO) 5 MG tablet      ferrous [id number] C-folic acid (TRINSICON / FOLTRIN) capsule Take 1 capsule by mouth 2 (two) times daily after a meal. (Patient taking differently: Take 1 capsule by mouth daily.) 60 capsule 1   HYDROcodone-acetaminophen (NORCO) 7.5-325 MG tablet TAKE 1/2 TO 1 TABLET BY MOUTH EVERY 6 HOURS AS NEEDED FOR MODERATE PAIN     latanoprost (XALATAN) 0.005 % ophthalmic solution Place 1 drop into both eyes at bedtime.     pantoprazole (PROTONIX) 40 MG tablet Take 1 tablet by mouth daily.     sacubitril-valsartan (ENTRESTO) 24-26 MG Take 1 tablet by mouth 2 (two) times daily. 60 tablet 5   triamcinolone cream (KENALOG) 0.1 % Apply 1 application topically 4 (four) times daily. On rash 450 g 1   warfarin (COUMADIN) 5 MG tablet Take 1 to 1&1/2 tablets by mouth daily as directed by the Anticoagulation Clinic. 130 tablet 1   allopurinol (ZYLOPRIM) 100 MG tablet Take 1 tablet (100 mg total) by mouth daily. 90 tablet 3   furosemide (LASIX) 40 MG tablet TAKE 1 TABLET BY MOUTH DAILY.  GENERIC EQUIVALENT FOR LASIX 90 tablet 1   No facility-administered medications prior to visit.    ROS: Review of Systems  Constitutional:  Positive for fatigue. Negative for appetite change and unexpected weight change.  HENT:  Negative for congestion, nosebleeds, sneezing, sore throat and trouble swallowing.   Eyes:  Negative for itching and visual disturbance.  Respiratory:  Positive for shortness of breath and wheezing. Negative for cough and chest tightness.   Cardiovascular:  Positive for leg swelling. Negative for chest pain and palpitations.  Gastrointestinal:  Negative for abdominal distention, blood in stool, diarrhea and nausea.  Genitourinary:  Negative for frequency and hematuria.  Musculoskeletal:  Negative for back pain, gait problem, joint swelling and neck pain.  Skin:  Negative for rash.  Neurological:  Positive for weakness. Negative for dizziness, tremors and speech difficulty.  Psychiatric/Behavioral:  Negative for agitation, dysphoric mood and sleep disturbance. The patient is not nervous/anxious.     Objective:  BP 118/68 (BP Location: Left Arm, Patient Position: Sitting, Cuff Size: Normal)   Pulse 76   Temp 98.2 F (36.8 C) (Oral)   Ht '5\' 9"'$  (1.753 m)   Wt 176 lb (79.8 kg)   SpO2 96%   BMI 25.99 kg/m   BP Readings from Last 3 Encounters:  10/25/21 118/68  09/27/21 130/86  07/26/21 98/60  Wt Readings from Last 3 Encounters:  10/25/21 176 lb (79.8 kg)  09/27/21 151 lb (68.5 kg)  07/26/21 155 lb (70.3 kg)    Physical Exam Constitutional:      General: He is not in acute distress.    Appearance: He is well-developed.     Comments: NAD  Eyes:     Conjunctiva/sclera: Conjunctivae normal.     Pupils: Pupils are equal, round, and reactive to light.  Neck:     Thyroid: No thyromegaly.     Vascular: No JVD.  Cardiovascular:     Rate and Rhythm: Normal rate. Rhythm irregular.     Heart sounds: Normal heart sounds. No murmur heard.    No friction  rub. No gallop.  Pulmonary:     Effort: Pulmonary effort is normal. No respiratory distress.     Breath sounds: Normal breath sounds. No wheezing or rales.  Chest:     Chest wall: No tenderness.  Abdominal:     General: Bowel sounds are normal. There is no distension.     Palpations: Abdomen is soft. There is no mass.     Tenderness: There is no abdominal tenderness. There is no guarding or rebound.  Musculoskeletal:        General: Swelling present. No tenderness. Normal range of motion.     Cervical back: Normal range of motion.  Lymphadenopathy:     Cervical: No cervical adenopathy.  Skin:    General: Skin is warm and dry.     Findings: No rash.  Neurological:     Mental Status: He is alert and oriented to person, place, and time.     Cranial Nerves: No cranial nerve deficit.     Motor: No weakness or abnormal muscle tone.     Coordination: Coordination normal.     Gait: Gait normal.     Deep Tendon Reflexes: Reflexes are normal and symmetric.  Psychiatric:        Behavior: Behavior normal.        Thought Content: Thought content normal.        Judgment: Judgment normal.    Ankles 1-2+ edema up to distal thighs - trace edema   Lab Results  Component Value Date   WBC 5.3 09/21/2021   HGB 11.1 (L) 09/21/2021   HCT 33.0 (L) 09/21/2021   PLT 146.0 (L) 09/21/2021   GLUCOSE 95 09/21/2021   CHOL 153 05/01/2021   TRIG 73 05/01/2021   HDL 66 05/01/2021   LDLCALC 73 05/01/2021   ALT 11 09/21/2021   AST 25 09/21/2021   NA 132 (L) 09/21/2021   K 3.7 09/21/2021   CL 98 09/21/2021   CREATININE 1.44 09/21/2021   BUN 28 (H) 09/21/2021   CO2 25 09/21/2021   TSH 6.199 (H) 02/04/2021   PSA 0.87 10/20/2014   INR 2.8 10/16/2021   HGBA1C 4.9 02/04/2021    ECHOCARDIOGRAM COMPLETE  Result Date: 02/27/2021    ECHOCARDIOGRAM REPORT   Patient Name:   Anthony Gould Date of Exam: 02/27/2021 Medical Rec #:  [id number]         Height:       69.0 in Accession #:    [id number]         Weight:       167.6 lb Date of Birth:  12-Jan-1934         BSA:          1.917 m Patient Age:    25 years  BP:           124/56 mmHg Patient Gender: M                 HR:           69 bpm. Exam Location:  Inpatient Procedure: 2D Echo, 3D Echo, Cardiac Doppler and Color Doppler Indications:     I50.40* Unspecified combined systolic (congestive) and                  diastolic (congestive) heart failure  History:         Patient has prior history of Echocardiogram examinations, most                  recent 03/09/2019. CHF, Abnormal ECG, Pulmonary HTN and TIA,                  Mitral Valve Disease, Arrythmias:Atrial Fibrillation and NSVT,                  Signs/Symptoms:Chest Pain and Syncope; Risk                  Factors:Hypertension and Diabetes. Mechanical mitral valve.                   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 Procedure Date: 50.  Sonographer:     Roseanna Rainbow RDCS Referring Phys:  [id number] [place] Diagnosing Phys: Oswaldo Milian MD  Sonographer Comments: Technically difficult study due to poor echo windows. IMPRESSIONS  1. Left ventricular ejection fraction, by estimation, is 40 to 45%. The left ventricle has mildly decreased function. The left ventricle demonstrates regional wall motion abnormalities (see scoring diagram/findings for description). Septal hypokinesis. There is moderate left ventricular hypertrophy. Left ventricular diastolic parameters are indeterminate.  2. Right ventricular systolic function is moderately reduced. The right ventricular size is moderately enlarged. There is moderately elevated pulmonary artery systolic pressure. The estimated right ventricular systolic pressure is 02.6 mmHg.  3. Left atrial size was severely dilated.  4. Right atrial size was severely dilated.  5. 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 Procedure Date: 26. Trivial mitral  valve regurgitation.     MG 33mHg at 63bpm, EOA 2.4 cm^2  6. The tricuspid valve is abnormal. Tricuspid valve regurgitation is severe.  7. The aortic valve is tricuspid. Aortic valve regurgitation is trivial. Aortic valve sclerosis is present, with no evidence of aortic valve stenosis.  8. The inferior vena cava is dilated in size with <50% respiratory variability, suggesting right atrial pressure of 15 mmHg. FINDINGS  Left Ventricle: Left ventricular ejection fraction, by estimation, is 40 to 45%. The left ventricle has mildly decreased function. The left ventricle demonstrates regional wall motion abnormalities. The left ventricular internal cavity size was normal in size. There is moderate left ventricular hypertrophy. Left ventricular diastolic parameters are indeterminate.  LV Wall Scoring: The entire septum is hypokinetic. The entire anterior wall, entire lateral wall, entire inferior wall, and apex are normal. Right Ventricle: The right ventricular size is moderately enlarged. Right vetricular wall thickness was not well visualized. Right ventricular systolic function is moderately reduced. There is moderately elevated pulmonary artery systolic pressure. The tricuspid regurgitant velocity is 3.09 m/s, and with an assumed right atrial pressure of 15 mmHg, the estimated right  ventricular systolic pressure is 65.4 mmHg. Left Atrium: Left atrial size was severely dilated. Right Atrium: Right atrial size was severely dilated. Pericardium: There is no evidence of pericardial effusion. Mitral Valve: The mitral valve has been repaired/replaced. Trivial mitral valve regurgitation. There is a unknown size Medtronic tilting disc valve present in the mitral position. Procedure Date: 16. MV peak gradient, 13.5 mmHg. The mean mitral valve gradient is 4.0 mmHg. Tricuspid Valve: The tricuspid valve is abnormal. Tricuspid valve regurgitation is severe. Aortic Valve: The aortic valve is tricuspid. Aortic valve regurgitation  is trivial. Aortic valve sclerosis is present, with no evidence of aortic valve stenosis. Pulmonic Valve: The pulmonic valve was not well visualized. Pulmonic valve regurgitation is trivial. Aorta: The aortic root and ascending aorta are structurally normal, with no evidence of dilitation. Venous: The inferior vena cava is dilated in size with less than 50% respiratory variability, suggesting right atrial pressure of 15 mmHg. IAS/Shunts: The interatrial septum was not well visualized.  LEFT VENTRICLE PLAX 2D LVIDd:         5.10 cm      Diastology LVIDs:         4.00 cm      LV e' medial:    7.40 cm/s LV PW:         2.00 cm      LV E/e' medial:  22.2 LV IVS:        0.80 cm      LV e' lateral:   7.29 cm/s LVOT diam:     2.40 cm      LV E/e' lateral: 22.5 LV SV:         97 LV SV Index:   51 LVOT Area:     4.52 cm  LV Volumes (MOD) LV vol d, MOD A2C: 135.0 ml LV vol d, MOD A4C: 105.0 ml LV vol s, MOD A2C: 69.9 ml LV vol s, MOD A4C: 59.0 ml LV SV MOD A2C:     65.1 ml LV SV MOD A4C:     105.0 ml LV SV MOD BP:      53.6 ml RIGHT VENTRICLE            IVC RV S prime:     8.33 cm/s  IVC diam: 2.70 cm TAPSE (M-mode): 1.4 cm LEFT ATRIUM            Index        RIGHT ATRIUM           Index LA diam:      5.80 cm  3.03 cm/m   RA Area:     39.60 cm LA Vol (A2C): 72.1 ml  37.62 ml/m  RA Volume:   166.00 ml 86.62 ml/m LA Vol (A4C): 140.0 ml 73.05 ml/m  AORTIC VALVE             PULMONIC VALVE LVOT Vmax:   104.00 cm/s PR End Diast Vel: 2.22 msec LVOT Vmean:  67.300 cm/s LVOT VTI:    0.215 m  AORTA Ao Root diam: 3.60 cm Ao Asc diam:  3.50 cm MITRAL VALVE                TRICUSPID VALVE MV Area (PHT): 3.48 cm     TR Peak grad:   38.2 mmHg MV Area VTI:   2.61 cm     TR Vmax:        309.00 cm/s MV Peak grad:  13.5 mmHg MV Mean grad:  4.0 mmHg  SHUNTS MV Vmax:       1.84 m/s     Systemic VTI:  0.22 m MV Vmean:      88.9 cm/s    Systemic Diam: 2.40 cm MV Decel Time: 218 msec MV E velocity: 164.00 cm/s Oswaldo Milian MD  Electronically signed by Oswaldo Milian MD Signature Date/Time: 02/27/2021/6:41:19 PM    Final (Updated)     Assessment & Plan:   Problem List Items Addressed This Visit     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 Nonsmoker. No h/o asthma. C/o swelling on the legs - worse C/o DOE, no orthopnea  Start on Torsemide 20 mg bid. D/c Lasix. Monitor GFR, K Will get a CXR Cardiology appt next Monday Reduce salt      Relevant Medications   torsemide (DEMADEX) 20 MG tablet   Other Relevant Orders   DG Chest 2 View   CRI (chronic renal insufficiency), stage 3 (moderate) (HCC)    Allopurinol -- reduce to 50 mg/d, no gout attacks      DM2 (diabetes mellitus, type 2) (HCC)    ON DIET ASA, Lipitor      Fatigue    Monitor GFR, Hgb Treat CHF      Gout    Allopurinol -- reduce to 50 mg/d, no gout attacks      PAF (paroxysmal atrial fibrillation) (HCC)    In A fib now      Relevant Medications   torsemide (DEMADEX) 20 MG tablet      Meds ordered this encounter  Medications   torsemide (DEMADEX) 20 MG tablet    Sig: Take 1 tablet (20 mg total) by mouth 2 (two) times daily.    Dispense:  60 tablet    Refill:  5   allopurinol (ZYLOPRIM) 100 MG tablet    Sig: Take 0.5 tablets (50 mg total) by mouth daily.    Dispense:  90 tablet    Refill:  3      Follow-up: Return in about 4 weeks (around 11/22/2021) for a follow-up visit.  Walker Kehr, MD

## 2021-10-26 ENCOUNTER — Other Ambulatory Visit: Payer: Self-pay | Admitting: Internal Medicine

## 2021-10-26 ENCOUNTER — Telehealth: Payer: Self-pay | Admitting: Internal Medicine

## 2021-10-26 MED ORDER — CEFDINIR 300 MG PO CAPS: 300.0000 mg | ORAL_CAPSULE | Freq: Two times a day (BID) | ORAL | 0 refills | Status: DC

## 2021-10-26 NOTE — Telephone Encounter (Signed)
Patient would like to confirm that the Cefdinir that he was prescribed is necessary for him to take.  He does not feel good about taking this medication before he talks to someone.

## 2021-10-29 NOTE — Telephone Encounter (Signed)
Keep cefdinir on hand for now.  If shortness of breath and swelling are better after using a diuretic, no need to use the antibiotic at the moment.  Thanks

## 2021-10-30 ENCOUNTER — Encounter: Payer: Self-pay | Admitting: Cardiovascular Disease

## 2021-10-30 ENCOUNTER — Ambulatory Visit: Payer: Medicare Other | Admitting: Cardiovascular Disease

## 2021-10-30 VITALS — BP 136/68 | HR 77 | Ht 69.0 in | Wt 175.8 lb

## 2021-10-30 DIAGNOSIS — I5043 Acute on chronic combined systolic (congestive) and diastolic (congestive) heart failure: Secondary | ICD-10-CM

## 2021-10-30 DIAGNOSIS — E7800 Pure hypercholesterolemia, unspecified: Secondary | ICD-10-CM

## 2021-10-30 DIAGNOSIS — I059 Rheumatic mitral valve disease, unspecified: Secondary | ICD-10-CM

## 2021-10-30 DIAGNOSIS — I4821 Permanent atrial fibrillation: Secondary | ICD-10-CM

## 2021-10-30 DIAGNOSIS — I10 Essential (primary) hypertension: Secondary | ICD-10-CM | POA: Diagnosis not present

## 2021-10-30 NOTE — Telephone Encounter (Signed)
Notified pt with MD response../[email or web address] 

## 2021-10-30 NOTE — Patient Instructions (Signed)
Medication Instructions:  For 3 days - increase torsemide 20 mg to --TWO TABLETS (40 MG) TWICE A DAY FOR 3 DAYS, then return to 1 tablet (20 mg) twice a day  *If you need a refill on your cardiac medications before your next appointment, please call your pharmacy*   Lab Work: In about one week, return for blood work Artist)  If you have labs (blood work) drawn today and your tests are completely normal, you will receive your results only by: [employer] (if you have MyChart) OR A paper copy in the mail If you have any lab test that is abnormal or we need to change your treatment, we will call you to review the results.   Testing/Procedures: please schedule echo 11/08/21 - get labs same day Your physician has requested that you have an echocardiogram. Echocardiography is a painless test that uses sound waves to create images of your heart. It provides your doctor with information about the size and shape of your heart and how well your heart's chambers and valves are working. This procedure takes approximately one hour. There are no restrictions for this procedure.   Follow-Up: At [facility], you and your health needs are our priority.  As part of our continuing mission to provide you with exceptional heart care, we have created designated Provider Care Teams.  These Care Teams include your primary Cardiologist (physician) and Advanced Practice Providers (APPs -  Physician Assistants and Nurse Practitioners) who all work together to provide you with the care you need, when you need it.   Your next appointment:   2 week(s)  The format for your next appointment:   In Person  Provider:   Lauree Chandler, MD  OR Advanced Practice Provider (NP or PA)    Important Information About Sugar

## 2021-10-30 NOTE — Progress Notes (Signed)
Chief Complaint  Patient presents with   Follow-up    Atrial fibrillation   History of Present Illness: 88 yo male with history of severe mitral regurgitation s/p MVR in 1986, permanent atrial fibrillation, pulmonary HTN, HTN, HLD, DM, CKD stage III, and prior TIA who is here today for cardiac follow up. He has been followed by Dr. Saunders Revel. I saw him for the first time in August 2020. He had MVR in 1986. He has been on coumadin. He has had prior TIA and has been on ASA and dypridamole. Syncope in 2020. Echo in December 2020 with LVEF=55-60%, moderate LVH, MVR with moderate MR. Cardiac monitor in 2020 with atrial fib, occasional PVCs, NSVT. He was admitted in September 2022 for an appendectomy. He developed an abscess and was readmitted in October 2022 for treatment. He then developed C diff colitis and was admitted for several weeks. His norvasc and Lisinopril were stopped due to hypotension. I saw him in December 2022 and he c/o worsened LE edema. We discussed increasing his Lasix to 40 mg BID for several days. He was admitted that night to [facility] with worsened diarrhea and found to be C. Diff positive again. Echo during that visit with LVEF=45-50%.  He was diuresed with IV lasix and started on Entresto. He was admitted to [facility] in December 2022 with C diff colitis. Echo 04/17/21 with LVEF=40% with global hypokinesis. Moderate RV dysfunction with dilated RV. Severe dilation both atria. Normally functioning mitral valve replacement with mild MR.   He is here today for follow up. The patient denies any chest pain, dyspnea, palpitations, orthopnea, PND, dizziness, near syncope or syncope. He has had recent worsened LE edema, dyspnea and fatigue. He was felt to be volume overloaded when he was seen by Dr. Alain Marion and his Lasix was changed to Torsemide. Chest x-ray 10/25/21 with evidence of pulmonary edema and right pleural effusion. He has been taking Torsemide 20 mg po BID for the past five days and has noticed  some improvement in his dyspnea and LE edema.   Primary Care Physician: Cassandria Anger, MD  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   NSVT (nonsustained ventricular tachycardia) (HCC)    Peripheral edema    venous insufficiency   Permanent atrial fibrillation ([place])    Pulmonary hypertension ([place])    Syncope    TIA (transient ischemic attack)    while on coumadin   Type II or unspecified type diabetes mellitus without mention of complication, not stated as uncontrolled    lifestyle mangement   Ventricular ectopy    symptomatic    Past Surgical History:  Procedure Laterality Date   CHOLECYSTECTOMY, LAPAROSCOPIC  2003   IR PATIENT EVAL TECH 0-60 MINS  01/09/2021   IR RADIOLOGIST EVAL & MGMT  01/19/2021   IR RADIOLOGIST EVAL & MGMT  02/02/2021   LAPAROSCOPIC APPENDECTOMY N/A 12/05/2020   Procedure: APPENDECTOMY LAPAROSCOPIC;  Surgeon: Stark Klein, MD;  Location: [facility];  Service: General;  Laterality: N/A;   MITRAL VALVE REPLACEMENT     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    Current Outpatient Medications  Medication Sig Dispense Refill   acetaminophen (TYLENOL) 500 MG tablet Take 500 mg by mouth every  6 (six) hours as needed for fever.     allopurinol (ZYLOPRIM) 100 MG tablet Take 0.5 tablets (50 mg total) by mouth daily. 90 tablet 3   aspirin 81 MG EC tablet Take 81 mg by mouth daily.     bisoprolol (ZEBETA) 5 MG tablet Take 1 tablet (5 mg total) by mouth daily. 90 tablet 3   Cholecalciferol (VITAMIN D3) 50 MCG (2000 UT) capsule TAKE 1 CAPSULE BY MOUTH EVERY DAY (Patient taking differently: Take 2,000 Units by mouth daily.) 100 capsule 3   dipyridamole (PERSANTINE) 50 MG tablet Take 1 tablet (50 mg total) by mouth 3 (three) times daily. 270 tablet  3   ferrous [id number] C-folic acid (TRINSICON / FOLTRIN) capsule Take 1 capsule by mouth 2 (two) times daily after a meal. (Patient taking differently: Take 1 capsule by mouth daily.) 60 capsule 1   HYDROcodone-acetaminophen (NORCO) 7.5-325 MG tablet TAKE 1/2 TO 1 TABLET BY MOUTH EVERY 6 HOURS AS NEEDED FOR MODERATE PAIN     latanoprost (XALATAN) 0.005 % ophthalmic solution Place 1 drop into both eyes at bedtime.     pantoprazole (PROTONIX) 40 MG tablet Take 1 tablet by mouth daily.     sacubitril-valsartan (ENTRESTO) 24-26 MG Take 1 tablet by mouth 2 (two) times daily. 60 tablet 5   torsemide (DEMADEX) 20 MG tablet Take 1 tablet (20 mg total) by mouth 2 (two) times daily. 60 tablet 5   triamcinolone cream (KENALOG) 0.1 % Apply 1 application topically 4 (four) times daily. On rash 450 g 1   warfarin (COUMADIN) 5 MG tablet Take 1 to 1&1/2 tablets by mouth daily as directed by the Anticoagulation Clinic. 130 tablet 1   cefdinir (OMNICEF) 300 MG capsule Take 1 capsule (300 mg total) by mouth 2 (two) times daily. (Patient not taking: Reported on 10/30/2021) 20 capsule 0   No current facility-administered medications for this visit.    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  Tobacco Use   Smoking status: Never   Smokeless tobacco: Never  Substance and Sexual Activity   Alcohol use: No    Alcohol/week: 0.0 standard drinks of alcohol   Drug use: No   Sexual activity: Not on  file  Other Topics Concern   Not on file  Social History Narrative   HSG, many management courses Married '58, 2 sons - '61, '62   Social Determinants of Health   Financial Resource Strain: Not on file  Food Insecurity: No Food Insecurity (05/22/2021)   Hunger Vital Sign    Worried About Running Out of Food in the Last Year: Never true    [place] in the Last Year: Never true  Transportation Needs: No Transportation Needs (05/22/2021)   PRAPARE - Hydrologist (Medical): No    Lack of Transportation (Non-Medical): No  Physical Activity: Not on file  Stress: Not on  file  Social Connections: Not on file  Intimate Partner Violence: Not on file    Family History  Problem Relation Age of Onset   Coronary artery disease Father    Diabetes Other    Coronary artery disease Other    Prostate cancer Neg Hx    Colon cancer Neg Hx     Review of Systems:  As stated in the HPI and otherwise negative.   BP 136/68   Pulse 77   Ht '5\' 9"'$  (1.753 m)   Wt 175 lb 12.8 oz (79.7 kg)   SpO2 98%   BMI 25.96 kg/m   Physical Examination:  General: Well developed, well nourished, NAD  HEENT: OP clear, mucus membranes moist  SKIN: warm, dry. No rashes. Neuro: No focal deficits  Musculoskeletal: Muscle strength 5/5 all ext  Psychiatric: Mood and affect normal  Neck: No JVD, no carotid bruits, no thyromegaly, no lymphadenopathy.  Lungs:Clear bilaterally, no wheezes, rhonci, crackles Cardiovascular: Irregular irregular with systolic murmur.  Abdomen:Soft. Bowel sounds present. Non-tender.  Extremities: 1-2+ bilateral lower extremity edema.   EKG:  EKG is not ordered today. The ekg ordered today demonstrates   Recent Labs: 02/04/2021: B Natriuretic Peptide 660.5; TSH 6.199 03/03/2021: Magnesium 1.8 10/25/2021: ALT 9; BUN 22; Creatinine, Ser 1.32; Hemoglobin 11.2; Platelets 146.0; Potassium 3.8; Sodium 136   Lipid Panel    Component Value Date/Time   CHOL  153 05/01/2021 0802   TRIG 73 05/01/2021 0802   HDL 66 05/01/2021 0802   CHOLHDL 2.3 05/01/2021 0802   CHOLHDL 2 04/01/2020 0808   VLDL 19.2 04/01/2020 0808   LDLCALC 73 05/01/2021 0802     Wt Readings from Last 3 Encounters:  10/30/21 175 lb 12.8 oz (79.7 kg)  10/25/21 176 lb (79.8 kg)  09/27/21 151 lb (68.5 kg)      Assessment and Plan:   1. Persistent atrial fibrillation: Rate controlled atrial fib today. Will continue bisoprolol and coumadin.      2. Mitral valve disease: He is s/p mechanical mitral valve replacement. This is working well by echo in January 2023. Will continue ASA and coumadin. He will use SBE prophylaxis as needed prior to dental procedures. Repeat echo now with recent worsened dyspnea and volume overload, systolic murmur on exam.   3. HTN: BP is controlled. No changes  4. HLD: Lipids followed in primary care. LDL near goal in January 2023. Will continue statin.    5. Chronic diastolic CHF: Weight is up 19 lbs since he was seen here in January 2023. He has evidence of pulmonary edema on x-ray last week and volume overload on exam today. Will increase Torsemide to 40 mg po BID for 3 days then back to 20 mg BID. He will follow daily weights. BMET in one week.    Labs/ tests ordered today include:   Orders Placed This Encounter  Procedures   Basic metabolic panel   ECHOCARDIOGRAM COMPLETE     Disposition:   F/U with me in 2 weeks   Signed, Lauree Chandler, MD 10/30/2021 3:44 PM    [facility] [address], [place], [place]  [address] Phone: [phone]; Fax: [phone]

## 2021-11-06 ENCOUNTER — Ambulatory Visit: Payer: Medicare Other

## 2021-11-06 DIAGNOSIS — Z7901 Long term (current) use of anticoagulants: Secondary | ICD-10-CM

## 2021-11-06 DIAGNOSIS — I4891 Unspecified atrial fibrillation: Secondary | ICD-10-CM

## 2021-11-06 DIAGNOSIS — I6350 Cerebral infarction due to unspecified occlusion or stenosis of unspecified cerebral artery: Secondary | ICD-10-CM | POA: Diagnosis not present

## 2021-11-06 DIAGNOSIS — Z9889 Other specified postprocedural states: Secondary | ICD-10-CM

## 2021-11-06 DIAGNOSIS — I059 Rheumatic mitral valve disease, unspecified: Secondary | ICD-10-CM

## 2021-11-06 LAB — POCT INR: INR: 4.3 — AB (ref 2.0–3.0)

## 2021-11-06 NOTE — Patient Instructions (Signed)
Description   Hold Warfarin tomorrow and then START taking 1 tablet daily EXCEPT 0.5 tablet on Fridays. Recheck in 2 weeks. Coumadin Clinic [phone] or [phone]

## 2021-11-08 ENCOUNTER — Other Ambulatory Visit: Payer: Medicare Other

## 2021-11-08 ENCOUNTER — Ambulatory Visit (HOSPITAL_COMMUNITY): Payer: Medicare Other | Attending: Cardiology

## 2021-11-08 DIAGNOSIS — I059 Rheumatic mitral valve disease, unspecified: Secondary | ICD-10-CM | POA: Insufficient documentation

## 2021-11-08 DIAGNOSIS — I5043 Acute on chronic combined systolic (congestive) and diastolic (congestive) heart failure: Secondary | ICD-10-CM

## 2021-11-08 DIAGNOSIS — R0609 Other forms of dyspnea: Secondary | ICD-10-CM

## 2021-11-08 LAB — ECHOCARDIOGRAM COMPLETE
Area-P 1/2: 2.35 cm2
MV VTI: 1.1 cm2
S' Lateral: 4.5 cm

## 2021-11-08 NOTE — Progress Notes (Unsigned)
Office Visit    Patient Name: [name] Date of Encounter: 11/13/2021  Primary Care Provider:  Cassandria Anger, MD Primary Cardiologist:  Lauree Chandler, MD Primary Electrophysiologist: None  Chief Complaint    Anthony Gould is a 88 y.o. male with PMH of severe MR with regurg s/p MVR in 1986, permanent AF, pulmonary HTN, HTN, HLD, DM, chronic kidney disease stage III TIA who presents today for follow-up for lower extremity edema.  Past Medical History    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   NSVT (nonsustained ventricular tachycardia) (HCC)    Peripheral edema    venous insufficiency   Permanent atrial fibrillation ([place])    Pulmonary hypertension ([place])    Syncope    TIA (transient ischemic attack)    while on coumadin   Type II or unspecified type diabetes mellitus without mention of complication, not stated as uncontrolled    lifestyle mangement   Ventricular ectopy    symptomatic   Past Surgical History:  Procedure Laterality Date   CHOLECYSTECTOMY, LAPAROSCOPIC  2003   IR PATIENT EVAL TECH 0-60 MINS  01/09/2021   IR RADIOLOGIST EVAL & MGMT  01/19/2021   IR RADIOLOGIST EVAL & MGMT  02/02/2021   LAPAROSCOPIC APPENDECTOMY N/A 12/05/2020   Procedure: APPENDECTOMY LAPAROSCOPIC;  Surgeon: Stark Klein, MD;  Location: [facility];  Service: General;  Laterality: N/A;   MITRAL VALVE REPLACEMENT     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    Allergies  Allergies  Allergen Reactions   Diltiazem Other (See Comments)    "Bradycardia," per pt; tolerates Amlodipine   Sulfa Antibiotics Rash and Other (See Comments)    Reaction not fully recalled    Sulfonamide Derivatives Rash and Other (See Comments)    Reaction not fully  recalled     History of Present Illness    Anthony Gould is a 88 year old male with the above-mentioned past medical history who presents today for follow-up of lower extremity edema.  He is currently followed by Dr. Angelena Form since 2020.  Patient underwent MVR repair in 1986 and has been on Coumadin for treatment.  Patient wore cardiac monitor in 2020 that revealed occasional PVCs with NSVT and atrial fibrillation.  He had a hospitalization and 11/2020 for an appendectomy and developed an abscess was readmitted in 12/2020 for treatment.  During admission patient developed C. difficile colitis that required several weeks hospital stay.  2D echo during admission revealed LVEF=45-50%.  He was diuresed with IV lasix and started on Entresto.  He was readmitted 02/2021 with continued C. difficile colitis.  2D echo completed 03/2021 revealed EF of 40% with global hypokinesis. Moderate RV dysfunction with dilated RV. Severe dilation both atria.  He was seen by Dr. Angelena Form on 10/2021 with complaint of dyspnea and lower extremity edema.  He had received good urinary output with torsemide 20 mg twice daily over the course of 5 days and dyspnea had improved slightly.  His torsemide was increased to 40 mg twice daily for 3 days then return to 20 mg twice daily.  2D echo was completed 11/08/2021 that showed EF was 40%, now 35%. He has no valve issues.  [name] was seen today  for follow-up with his wife in office.  He reports that he has had some improvement with swelling however he still does not feel well and tires easily with activity.  He is volume up on examination and states that he feels very weak after walking short distances.  He is fully compliant with his medications and denies any side effects currently.  We discussed at length the results of his 2D echo and neck steps in treatment for his CHF.  He has elected to try medication before [facility]. Patient denies chest pain, palpitations, dyspnea, PND,  orthopnea, nausea, vomiting, dizziness, syncope, edema, weight gain, or early satiety.   Home Medications    Current Outpatient Medications  Medication Sig Dispense Refill   acetaminophen (TYLENOL) 500 MG tablet Take 500 mg by mouth every 6 (six) hours as needed for fever.     allopurinol (ZYLOPRIM) 100 MG tablet Take 0.5 tablets (50 mg total) by mouth daily. 90 tablet 3   aspirin 81 MG EC tablet Take 81 mg by mouth daily.     bisoprolol (ZEBETA) 5 MG tablet Take 1 tablet (5 mg total) by mouth daily. 90 tablet 3   cefdinir (OMNICEF) 300 MG capsule Take 1 capsule (300 mg total) by mouth 2 (two) times daily. 20 capsule 0   Cholecalciferol (VITAMIN D3) 50 MCG (2000 UT) capsule TAKE 1 CAPSULE BY MOUTH EVERY DAY (Patient taking differently: Take 2,000 Units by mouth daily.) 100 capsule 3   dipyridamole (PERSANTINE) 50 MG tablet Take 1 tablet (50 mg total) by mouth 3 (three) times daily. 270 tablet 3   ferrous [id number] C-folic acid (TRINSICON / FOLTRIN) capsule Take 1 capsule by mouth 2 (two) times daily after a meal. (Patient taking differently: Take 1 capsule by mouth daily.) 60 capsule 1   HYDROcodone-acetaminophen (NORCO) 7.5-325 MG tablet TAKE 1/2 TO 1 TABLET BY MOUTH EVERY 6 HOURS AS NEEDED FOR MODERATE PAIN     latanoprost (XALATAN) 0.005 % ophthalmic solution Place 1 drop into both eyes at bedtime.     pantoprazole (PROTONIX) 40 MG tablet Take 1 tablet by mouth daily.     sacubitril-valsartan (ENTRESTO) 24-26 MG Take 1 tablet by mouth 2 (two) times daily. 60 tablet 5   torsemide (DEMADEX) 20 MG tablet Take 1 tablet (20 mg total) by mouth 2 (two) times daily. 60 tablet 5   triamcinolone cream (KENALOG) 0.1 % Apply 1 application topically 4 (four) times daily. On rash 450 g 1   warfarin (COUMADIN) 5 MG tablet Take 1 to 1&1/2 tablets by mouth daily as directed by the Anticoagulation Clinic. 130 tablet 1   No current facility-administered medications for this visit.     Review of  Systems  Please see the history of present illness.    (+) Lower extremity swelling (+) Shortness of breath  All other systems reviewed and are otherwise negative except as noted above.  Physical Exam    Wt Readings from Last 3 Encounters:  11/13/21 174 lb (78.9 kg)  10/30/21 175 lb 12.8 oz (79.7 kg)  10/25/21 176 lb (79.8 kg)   VS: Vitals:   11/13/21 1442  BP: (!) 140/68  Pulse: 74  SpO2: 97%  ,Body mass index is 25.7 kg/m.  Constitutional:      Appearance: Volume overload not in distress.  Neck:     Vascular: JVD elevated on examination Pulmonary:     Effort: Pulmonary effort is normal.     Breath sounds: Inspiratory wheezing. No rales. Diminished in the  bases Cardiovascular:  Irregularly irregular normal S1. Normal S2.      Murmurs:   Edema:    Peripheral edema present 2+ in lower extremities.  Abdominal:     Palpations: Abdomen is firm to touch there is no hepatomegaly.  Skin:    General: Skin is warm and dry.  Neurological:     General: No focal deficit present.     Mental Status: Alert and oriented to person, place and time.     Cranial Nerves: Cranial nerves are intact.  EKG/LABS/Other Studies Reviewed    ECG personally reviewed by me today -wide QRS rhythm with occasional PVCs with left axis deviation and rate of 740 .  Risk Assessment/Calculations:    CHA2DS2-VASc Score = 3   This indicates a 3.2% annual risk of stroke. The patient's score is based upon: CHF History: 0 HTN History: 1 Diabetes History: 0 Stroke History: 0 Vascular Disease History: 0 Age Score: 2 Gender Score: 0           Lab Results  Component Value Date   WBC 4.2 10/25/2021   HGB 11.2 (L) 10/25/2021   HCT 33.7 (L) 10/25/2021   MCV 98.6 10/25/2021   PLT 146.0 (L) 10/25/2021   Lab Results  Component Value Date   CREATININE 1.44 (H) 11/08/2021   BUN 25 11/08/2021   NA 137 11/08/2021   K 3.6 11/08/2021   CL 98 11/08/2021   CO2 23 11/08/2021   Lab Results   Component Value Date   ALT 9 10/25/2021   AST 25 10/25/2021   ALKPHOS 69 10/25/2021   BILITOT 1.2 10/25/2021   Lab Results  Component Value Date   CHOL 153 05/01/2021   HDL 66 05/01/2021   LDLCALC 73 05/01/2021   TRIG 73 05/01/2021   CHOLHDL 2.3 05/01/2021    Lab Results  Component Value Date   HGBA1C 4.9 02/04/2021    Assessment & Plan    1.  Chronic diastolic CHF: -2D echo completed 11/08/2028 with EF was 40%, now 35%. He has no valve issues. -We will have patient increase torsemide 20 mg twice daily for 5 days -Patient is volume up severely on examination today -I consulted the DOD Dr. Acie Fredrickson regarding patient's volume and possible need for ED visit for diuresis.  It was determined that patient would be safe to go home and diuresis with p.o. medications at this time. -We will increase Entresto to 49/51 mg -Patient will monitor blood pressures at home and report any systolic less than 517 -We will repeat BMET in 1 week -Patient is volume up severely on examination today Low sodium diet, fluid restriction <2L, and daily weights encouraged. Educated to contact our office for weight gain of 2 lbs overnight or 5 lbs in one week.   2.  Persistent atrial fibrillation: -Patient is rate controlled on exam today -Rate is currently controlled at 70 BP -Continue bisoprolol and Coumadin 5 mg daily  3.  HTN: -Patient's blood pressure today was well controlled at 126/74 -Continue bisoprolol as noted above and Entresto 24/26 mg -Patient advised to document blood pressures and report any systolic less than 001  4.  HLD: -Patient's last LDL cholesterol was 66 at goal of less than 70 -Continue low-sodium heart healthy diet  5.  Mitral valve disease:  -Patient advised regarding SBE prophylaxis for any dental procedures   Disposition: Follow-up with Lauree Chandler, MD or APP in 2 weeks  Shared Decision Making/Informed Consent The risks [stroke (1 in 1000),  death (1 in 60),  kidney failure [usually temporary] (1 in 500), bleeding (1 in 200), allergic reaction [possibly serious] (1 in 200)], benefits (diagnostic support and management of coronary artery disease) and alternatives of a cardiac catheterization were discussed in detail with Mr. Scorsone and he is willing to proceed.   Medication Adjustments/Labs and Tests Ordered: Current medicines are reviewed at length with the patient today.  Concerns regarding medicines are outlined above.   Signed, Mable Fill, Marissa Nestle, NP 11/13/2021, 3:10 PM [place]

## 2021-11-09 LAB — BASIC METABOLIC PANEL
BUN/Creatinine Ratio: 17 (ref 10–24)
BUN: 25 mg/dL (ref 8–27)
CO2: 23 mmol/L (ref 20–29)
Calcium: 8.8 mg/dL (ref 8.6–10.2)
Chloride: 98 mmol/L (ref 96–106)
Creatinine, Ser: 1.44 mg/dL — ABNORMAL HIGH (ref 0.76–1.27)
Glucose: 99 mg/dL (ref 70–99)
Potassium: 3.6 mmol/L (ref 3.5–5.2)
Sodium: 137 mmol/L (ref 134–144)
eGFR: 47 mL/min/{1.73_m2} — ABNORMAL LOW (ref >59)

## 2021-11-13 ENCOUNTER — Ambulatory Visit: Payer: Medicare Other | Admitting: Nurse Practitioner

## 2021-11-13 ENCOUNTER — Encounter: Payer: Self-pay | Admitting: Nurse Practitioner

## 2021-11-13 ENCOUNTER — Telehealth: Payer: Self-pay | Admitting: Nurse Practitioner

## 2021-11-13 VITALS — BP 140/68 | HR 74 | Ht 69.0 in | Wt 174.0 lb

## 2021-11-13 DIAGNOSIS — I6350 Cerebral infarction due to unspecified occlusion or stenosis of unspecified cerebral artery: Secondary | ICD-10-CM | POA: Diagnosis not present

## 2021-11-13 DIAGNOSIS — E7800 Pure hypercholesterolemia, unspecified: Secondary | ICD-10-CM

## 2021-11-13 DIAGNOSIS — I5043 Acute on chronic combined systolic (congestive) and diastolic (congestive) heart failure: Secondary | ICD-10-CM | POA: Diagnosis not present

## 2021-11-13 DIAGNOSIS — I10 Essential (primary) hypertension: Secondary | ICD-10-CM

## 2021-11-13 DIAGNOSIS — Z952 Presence of prosthetic heart valve: Secondary | ICD-10-CM

## 2021-11-13 MED ORDER — ENTRESTO 49-51 MG PO TABS: 1.0000 | ORAL_TABLET | Freq: Two times a day (BID) | ORAL | 1 refills | Status: DC

## 2021-11-13 MED ORDER — TORSEMIDE 40 MG PO TABS: 40.0000 mg | ORAL_TABLET | Freq: Every day | ORAL | 1 refills | Status: DC

## 2021-11-13 NOTE — Patient Instructions (Addendum)
Medication Instructions:  INCREASE Torsemide '40mg'$  Take 1 tablet daily INCREASE Entresto to 49/'51mg'$  Take 1 tablet twice a day  *If you need a refill on your cardiac medications before your next appointment, please call your pharmacy*   Lab Work: BMET IN 1 WEEK If you have labs (blood work) drawn today and your tests are completely normal, you will receive your results only by: [facility] (if you have MyChart) OR A paper copy in the mail If you have any lab test that is abnormal or we need to change your treatment, we will call you to review the results.   Testing/Procedures: NONE ORDERED   Follow-Up: At [facility], you and your health needs are our priority.  As part of our continuing mission to provide you with exceptional heart care, we have created designated Provider Care Teams.  These Care Teams include your primary Cardiologist (physician) and Advanced Practice Providers (APPs -  Physician Assistants and Nurse Practitioners) who all work together to provide you with the care you need, when you need it.  We recommend signing up for the patient portal called "MyChart".  Sign up information is provided on this After Visit Summary.  MyChart is used to connect with patients for Virtual Visits (Telemedicine).  Patients are able to view lab/test results, encounter notes, upcoming appointments, etc.  Non-urgent messages can be sent to your provider as well.   To learn more about what you can do with MyChart, go to [email or web address].    Your next appointment:   2 week(s)  The format for your next appointment:   In Person  Provider:   Lauree Chandler, MD or APP on day cardiologist in office   Other Instructions CHECK [place]

## 2021-11-13 NOTE — Telephone Encounter (Signed)
Pt c/o medication issue:  1. Name of Medication: torsemide 40 MG TABS  2. How are you currently taking this medication (dosage and times per day)?   3. Are you having a reaction (difficulty breathing--STAT)?   4. What is your medication issue? Pharmacy called stating they just received this prescription but they dont have '40mg'$  tablets for medication and asking if its okay for it to be '20mg'$ .

## 2021-11-13 NOTE — Telephone Encounter (Signed)
Called pharmacy to let them know two 20 mg tablets are fine to equal 40 mg.

## 2021-11-17 ENCOUNTER — Ambulatory Visit: Payer: Medicare Other | Admitting: Cardiovascular Disease

## 2021-11-17 DIAGNOSIS — I471 Supraventricular tachycardia: Secondary | ICD-10-CM | POA: Diagnosis not present

## 2021-11-17 DIAGNOSIS — I10 Essential (primary) hypertension: Secondary | ICD-10-CM | POA: Diagnosis not present

## 2021-11-17 DIAGNOSIS — R011 Cardiac murmur, unspecified: Secondary | ICD-10-CM | POA: Diagnosis not present

## 2021-11-20 ENCOUNTER — Other Ambulatory Visit: Payer: Self-pay

## 2021-11-20 ENCOUNTER — Ambulatory Visit: Payer: Medicare Other | Attending: Cardiovascular Disease

## 2021-11-20 DIAGNOSIS — I480 Paroxysmal atrial fibrillation: Secondary | ICD-10-CM

## 2021-11-20 DIAGNOSIS — I059 Rheumatic mitral valve disease, unspecified: Secondary | ICD-10-CM | POA: Diagnosis not present

## 2021-11-20 DIAGNOSIS — I5043 Acute on chronic combined systolic (congestive) and diastolic (congestive) heart failure: Secondary | ICD-10-CM

## 2021-11-20 DIAGNOSIS — Z9889 Other specified postprocedural states: Secondary | ICD-10-CM

## 2021-11-20 DIAGNOSIS — I4891 Unspecified atrial fibrillation: Secondary | ICD-10-CM

## 2021-11-20 DIAGNOSIS — I6350 Cerebral infarction due to unspecified occlusion or stenosis of unspecified cerebral artery: Secondary | ICD-10-CM | POA: Diagnosis not present

## 2021-11-20 DIAGNOSIS — Z7901 Long term (current) use of anticoagulants: Secondary | ICD-10-CM

## 2021-11-20 DIAGNOSIS — R197 Diarrhea, unspecified: Secondary | ICD-10-CM

## 2021-11-20 DIAGNOSIS — Z5181 Encounter for therapeutic drug level monitoring: Secondary | ICD-10-CM | POA: Diagnosis not present

## 2021-11-20 LAB — POCT INR: INR: 4 — AB (ref 2.0–3.0)

## 2021-11-20 NOTE — Patient Instructions (Signed)
Hold Warfarin tomorrow and then START taking 1 tablet daily EXCEPT 0.5 tablet on Monday AND Friday. Recheck in 2 weeks. Coumadin Clinic [phone] or [phone]

## 2021-11-21 ENCOUNTER — Telehealth: Payer: Self-pay | Admitting: Cardiovascular Disease

## 2021-11-21 LAB — BASIC METABOLIC PANEL
BUN/Creatinine Ratio: 24 (ref 10–24)
BUN: 32 mg/dL — ABNORMAL HIGH (ref 8–27)
CO2: 22 mmol/L (ref 20–29)
Calcium: 8.4 mg/dL — ABNORMAL LOW (ref 8.6–10.2)
Chloride: 99 mmol/L (ref 96–106)
Creatinine, Ser: 1.31 mg/dL — ABNORMAL HIGH (ref 0.76–1.27)
Glucose: 96 mg/dL (ref 70–99)
Potassium: 4 mmol/L (ref 3.5–5.2)
Sodium: 136 mmol/L (ref 134–144)
eGFR: 53 mL/min/{1.73_m2} — ABNORMAL LOW (ref >59)

## 2021-11-21 NOTE — Telephone Encounter (Signed)
Pt returning nurses call regarding lab results. Please advise 

## 2021-11-21 NOTE — Telephone Encounter (Signed)
Reviewed results with patient.  Continues feeling well on current diuretic regimen.

## 2021-11-22 ENCOUNTER — Ambulatory Visit: Payer: Medicare Other | Admitting: Internal Medicine

## 2021-11-22 ENCOUNTER — Encounter: Payer: Self-pay | Admitting: Internal Medicine

## 2021-11-22 DIAGNOSIS — I480 Paroxysmal atrial fibrillation: Secondary | ICD-10-CM | POA: Diagnosis not present

## 2021-11-22 DIAGNOSIS — I5082 Biventricular heart failure: Secondary | ICD-10-CM

## 2021-11-22 DIAGNOSIS — N183 Chronic kidney disease, stage 3 unspecified: Secondary | ICD-10-CM | POA: Diagnosis not present

## 2021-11-22 DIAGNOSIS — E1159 Type 2 diabetes mellitus with other circulatory complications: Secondary | ICD-10-CM

## 2021-11-22 MED ORDER — METHYLPREDNISOLONE 4 MG PO TBPK: ORAL_TABLET | ORAL | 0 refills | Status: DC

## 2021-11-22 NOTE — Progress Notes (Signed)
Subjective:  Patient ID: Anthony Gould, male    DOB: 12/21/1933  Age: 88 y.o. MRN: [id number]  CC: Follow-up (4 week f/u)   HPI Anthony Gould presents for CHF, HTN, DM2, CRI  Outpatient Medications Prior to Visit  Medication Sig Dispense Refill   acetaminophen (TYLENOL) 500 MG tablet Take 500 mg by mouth every 6 (six) hours as needed for fever.     allopurinol (ZYLOPRIM) 100 MG tablet Take 0.5 tablets (50 mg total) by mouth daily. 90 tablet 3   aspirin 81 MG EC tablet Take 81 mg by mouth daily.     bisoprolol (ZEBETA) 5 MG tablet Take 1 tablet (5 mg total) by mouth daily. 90 tablet 3   Cholecalciferol (VITAMIN D3) 50 MCG (2000 UT) capsule TAKE 1 CAPSULE BY MOUTH EVERY DAY (Patient taking differently: Take 2,000 Units by mouth daily.) 100 capsule 3   dipyridamole (PERSANTINE) 50 MG tablet Take 1 tablet (50 mg total) by mouth 3 (three) times daily. 270 tablet 3   ferrous [id number] C-folic acid (TRINSICON / FOLTRIN) capsule Take 1 capsule by mouth 2 (two) times daily after a meal. (Patient taking differently: Take 1 capsule by mouth daily.) 60 capsule 1   HYDROcodone-acetaminophen (NORCO) 7.5-325 MG tablet TAKE 1/2 TO 1 TABLET BY MOUTH EVERY 6 HOURS AS NEEDED FOR MODERATE PAIN     latanoprost (XALATAN) 0.005 % ophthalmic solution Place 1 drop into both eyes at bedtime.     pantoprazole (PROTONIX) 40 MG tablet Take 1 tablet by mouth daily.     sacubitril-valsartan (ENTRESTO) 49-51 MG Take 1 tablet by mouth 2 (two) times daily. 60 tablet 1   torsemide 40 MG TABS Take 40 mg by mouth daily. 30 tablet 1   triamcinolone cream (KENALOG) 0.1 % Apply 1 application topically 4 (four) times daily. On rash 450 g 1   warfarin (COUMADIN) 5 MG tablet Take 1 to 1&1/2 tablets by mouth daily as directed by the Anticoagulation Clinic. 130 tablet 1   cefdinir (OMNICEF) 300 MG capsule Take 1 capsule (300 mg total) by mouth 2 (two) times daily. (Patient not taking: Reported on 11/22/2021) 20  capsule 0   No facility-administered medications prior to visit.    ROS: Review of Systems  Constitutional:  Negative for appetite change, fatigue and unexpected weight change.  HENT:  Negative for congestion, nosebleeds, sneezing, sore throat and trouble swallowing.   Eyes:  Negative for itching and visual disturbance.  Respiratory:  Negative for cough.   Cardiovascular:  Negative for chest pain, palpitations and leg swelling.  Gastrointestinal:  Negative for abdominal distention, blood in stool, diarrhea and nausea.  Genitourinary:  Negative for frequency and hematuria.  Musculoskeletal:  Positive for arthralgias and gait problem. Negative for back pain, joint swelling and neck pain.  Skin:  Negative for rash.  Neurological:  Negative for dizziness, tremors, speech difficulty and weakness.  Psychiatric/Behavioral:  Negative for agitation, dysphoric mood and sleep disturbance. The patient is not nervous/anxious.     Objective:  BP 130/68 (BP Location: Left Arm)   Pulse 66   Temp 97.9 F (36.6 C) (Oral)   Ht '5\' 9"'$  (1.753 m)   Wt 172 lb 12.8 oz (78.4 kg)   SpO2 97%   BMI 25.52 kg/m   BP Readings from Last 3 Encounters:  11/22/21 130/68  11/13/21 (!) 140/68  10/30/21 136/68    Wt Readings from Last 3 Encounters:  11/22/21 172 lb 12.8 oz (78.4 kg)  11/13/21 174 lb (  78.9 kg)  10/30/21 175 lb 12.8 oz (79.7 kg)    Physical Exam Constitutional:      General: He is not in acute distress.    Appearance: He is well-developed.     Comments: NAD  Eyes:     Conjunctiva/sclera: Conjunctivae normal.     Pupils: Pupils are equal, round, and reactive to light.  Neck:     Thyroid: No thyromegaly.     Vascular: No JVD.  Cardiovascular:     Rate and Rhythm: Normal rate and regular rhythm.     Heart sounds: Normal heart sounds. No murmur heard.    No friction rub. No gallop.  Pulmonary:     Effort: Pulmonary effort is normal. No respiratory distress.     Breath sounds: Normal  breath sounds. No wheezing or rales.  Chest:     Chest wall: No tenderness.  Abdominal:     General: Bowel sounds are normal. There is no distension.     Palpations: Abdomen is soft. There is no mass.     Tenderness: There is no abdominal tenderness. There is no guarding or rebound.  Musculoskeletal:        General: No tenderness. Normal range of motion.     Cervical back: Normal range of motion.  Lymphadenopathy:     Cervical: No cervical adenopathy.  Skin:    General: Skin is warm and dry.     Findings: No rash.  Neurological:     Mental Status: He is alert and oriented to person, place, and time.     Cranial Nerves: No cranial nerve deficit.     Motor: No abnormal muscle tone.     Coordination: Coordination normal.     Gait: Gait normal.     Deep Tendon Reflexes: Reflexes are normal and symmetric.  Psychiatric:        Behavior: Behavior normal.        Thought Content: Thought content normal.        Judgment: Judgment normal.   Using a cane L 1st MTP w/pain  Lab Results  Component Value Date   WBC 4.2 10/25/2021   HGB 11.2 (L) 10/25/2021   HCT 33.7 (L) 10/25/2021   PLT 146.0 (L) 10/25/2021   GLUCOSE 96 11/20/2021   CHOL 153 05/01/2021   TRIG 73 05/01/2021   HDL 66 05/01/2021   LDLCALC 73 05/01/2021   ALT 9 10/25/2021   AST 25 10/25/2021   NA 136 11/20/2021   K 4.0 11/20/2021   CL 99 11/20/2021   CREATININE 1.31 (H) 11/20/2021   BUN 32 (H) 11/20/2021   CO2 22 11/20/2021   TSH 6.199 (H) 02/04/2021   PSA 0.87 10/20/2014   INR 4.0 (A) 11/20/2021   HGBA1C 4.9 02/04/2021    ECHOCARDIOGRAM COMPLETE  Result Date: 02/27/2021    ECHOCARDIOGRAM REPORT   Patient Name:   Anthony Gould Date of Exam: 02/27/2021 Medical Rec #:  [id number]         Height:       69.0 in Accession #:    [id number]        Weight:       167.6 lb Date of Birth:  1933/09/16         BSA:          1.917 m Patient Age:    9 years          BP:           124/56 mmHg Patient Gender:  M                  HR:           69 bpm. Exam Location:  Inpatient Procedure: 2D Echo, 3D Echo, Cardiac Doppler and Color Doppler Indications:     I50.40* Unspecified combined systolic (congestive) and                  diastolic (congestive) heart failure  History:         Patient has prior history of Echocardiogram examinations, most                  recent 03/09/2019. CHF, Abnormal ECG, Pulmonary HTN and TIA,                  Mitral Valve Disease, Arrythmias:Atrial Fibrillation and NSVT,                  Signs/Symptoms:Chest Pain and Syncope; Risk                  Factors:Hypertension and Diabetes. Mechanical mitral valve.                   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 Procedure Date: 46.  Sonographer:     Roseanna Rainbow RDCS Referring Phys:  [id number] [place] Diagnosing Phys: Oswaldo Milian MD  Sonographer Comments: Technically difficult study due to poor echo windows. IMPRESSIONS  1. Left ventricular ejection fraction, by estimation, is 40 to 45%. The left ventricle has mildly decreased function. The left ventricle demonstrates regional wall motion abnormalities (see scoring diagram/findings for description). Septal hypokinesis. There is moderate left ventricular hypertrophy. Left ventricular diastolic parameters are indeterminate.  2. Right ventricular systolic function is moderately reduced. The right ventricular size is moderately enlarged. There is moderately elevated pulmonary artery systolic pressure. The estimated right ventricular systolic pressure is 39.7 mmHg.  3. Left atrial size was severely dilated.  4. Right atrial size was severely dilated.  5. 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 Procedure Date: 51. Trivial mitral valve regurgitation.     MG 81mHg at 63bpm, EOA 2.4 cm^2  6. The tricuspid valve is abnormal. Tricuspid valve regurgitation is severe.  7. The aortic valve is tricuspid. Aortic  valve regurgitation is trivial. Aortic valve sclerosis is present, with no evidence of aortic valve stenosis.  8. The inferior vena cava is dilated in size with <50% respiratory variability, suggesting right atrial pressure of 15 mmHg. FINDINGS  Left Ventricle: Left ventricular ejection fraction, by estimation, is 40 to 45%. The left ventricle has mildly decreased function. The left ventricle demonstrates regional wall motion abnormalities. The left ventricular internal cavity size was normal in size. There is moderate left ventricular hypertrophy. Left ventricular diastolic parameters are indeterminate.  LV Wall Scoring: The entire septum is hypokinetic. The entire anterior wall, entire lateral wall, entire inferior wall, and apex are normal. Right Ventricle: The right ventricular size is moderately enlarged. Right vetricular wall thickness was not well visualized. Right ventricular systolic function is moderately reduced. There is moderately elevated pulmonary artery systolic pressure. The tricuspid regurgitant velocity is 3.09 m/s, and with an assumed right atrial pressure of 15 mmHg, the estimated right ventricular systolic pressure is 567.3mmHg. Left Atrium: Left atrial size was severely dilated.  Right Atrium: Right atrial size was severely dilated. Pericardium: There is no evidence of pericardial effusion. Mitral Valve: The mitral valve has been repaired/replaced. Trivial mitral valve regurgitation. There is a unknown size Medtronic tilting disc valve present in the mitral position. Procedure Date: 61. MV peak gradient, 13.5 mmHg. The mean mitral valve gradient is 4.0 mmHg. Tricuspid Valve: The tricuspid valve is abnormal. Tricuspid valve regurgitation is severe. Aortic Valve: The aortic valve is tricuspid. Aortic valve regurgitation is trivial. Aortic valve sclerosis is present, with no evidence of aortic valve stenosis. Pulmonic Valve: The pulmonic valve was not well visualized. Pulmonic valve  regurgitation is trivial. Aorta: The aortic root and ascending aorta are structurally normal, with no evidence of dilitation. Venous: The inferior vena cava is dilated in size with less than 50% respiratory variability, suggesting right atrial pressure of 15 mmHg. IAS/Shunts: The interatrial septum was not well visualized.  LEFT VENTRICLE PLAX 2D LVIDd:         5.10 cm      Diastology LVIDs:         4.00 cm      LV e' medial:    7.40 cm/s LV PW:         2.00 cm      LV E/e' medial:  22.2 LV IVS:        0.80 cm      LV e' lateral:   7.29 cm/s LVOT diam:     2.40 cm      LV E/e' lateral: 22.5 LV SV:         97 LV SV Index:   51 LVOT Area:     4.52 cm  LV Volumes (MOD) LV vol d, MOD A2C: 135.0 ml LV vol d, MOD A4C: 105.0 ml LV vol s, MOD A2C: 69.9 ml LV vol s, MOD A4C: 59.0 ml LV SV MOD A2C:     65.1 ml LV SV MOD A4C:     105.0 ml LV SV MOD BP:      53.6 ml RIGHT VENTRICLE            IVC RV S prime:     8.33 cm/s  IVC diam: 2.70 cm TAPSE (M-mode): 1.4 cm LEFT ATRIUM            Index        RIGHT ATRIUM           Index LA diam:      5.80 cm  3.03 cm/m   RA Area:     39.60 cm LA Vol (A2C): 72.1 ml  37.62 ml/m  RA Volume:   166.00 ml 86.62 ml/m LA Vol (A4C): 140.0 ml 73.05 ml/m  AORTIC VALVE             PULMONIC VALVE LVOT Vmax:   104.00 cm/s PR End Diast Vel: 2.22 msec LVOT Vmean:  67.300 cm/s LVOT VTI:    0.215 m  AORTA Ao Root diam: 3.60 cm Ao Asc diam:  3.50 cm MITRAL VALVE                TRICUSPID VALVE MV Area (PHT): 3.48 cm     TR Peak grad:   38.2 mmHg MV Area VTI:   2.61 cm     TR Vmax:        309.00 cm/s MV Peak grad:  13.5 mmHg MV Mean grad:  4.0 mmHg     SHUNTS MV Vmax:       1.84 m/s  Systemic VTI:  0.22 m MV Vmean:      88.9 cm/s    Systemic Diam: 2.40 cm MV Decel Time: 218 msec MV E velocity: 164.00 cm/s Oswaldo Milian MD Electronically signed by Oswaldo Milian MD Signature Date/Time: 02/27/2021/6:41:19 PM    Final (Updated)     Assessment & Plan:   Problem List Items Addressed  This Visit     Biventricular heart failure ([place])    On Torsemide 40 mg qd. Cont on [employer] were OK      Relevant Orders   Comprehensive metabolic panel   CRI (chronic renal insufficiency), stage 3 (moderate) (HCC)    Monitor GFR, K      DM2 (diabetes mellitus, type 2) (HCC)    Cont on ADA diet On ASA, Lipitor      PAF (paroxysmal atrial fibrillation) (HCC)    Cont w/Bisoprolol, Coumadin         Meds ordered this encounter  Medications   methylPREDNISolone (MEDROL DOSEPAK) 4 MG TBPK tablet    Sig: As directed for gout attack    Dispense:  21 tablet    Refill:  0      Follow-up: Return in about 6 weeks (around 01/03/2022) for a follow-up visit.  Walker Kehr, MD

## 2021-11-22 NOTE — Assessment & Plan Note (Signed)
Cont on ADA diet On ASA, Lipitor

## 2021-11-22 NOTE — Assessment & Plan Note (Signed)
On Torsemide 40 mg qd. Cont on [employer] were [employer]

## 2021-11-22 NOTE — Assessment & Plan Note (Signed)
Monitor GFR, K

## 2021-11-28 DIAGNOSIS — H26493 Other secondary cataract, bilateral: Secondary | ICD-10-CM | POA: Diagnosis not present

## 2021-11-28 DIAGNOSIS — H35033 Hypertensive retinopathy, bilateral: Secondary | ICD-10-CM | POA: Diagnosis not present

## 2021-11-28 DIAGNOSIS — H40053 Ocular hypertension, bilateral: Secondary | ICD-10-CM | POA: Diagnosis not present

## 2021-11-28 DIAGNOSIS — H53432 Sector or arcuate defects, left eye: Secondary | ICD-10-CM | POA: Diagnosis not present

## 2021-11-29 ENCOUNTER — Ambulatory Visit: Payer: Medicare Other | Attending: Cardiovascular Disease | Admitting: Cardiovascular Disease

## 2021-11-29 ENCOUNTER — Encounter: Payer: Self-pay | Admitting: Cardiovascular Disease

## 2021-11-29 VITALS — BP 122/62 | HR 77 | Ht 69.0 in | Wt 166.8 lb

## 2021-11-29 DIAGNOSIS — I10 Essential (primary) hypertension: Secondary | ICD-10-CM

## 2021-11-29 DIAGNOSIS — E7800 Pure hypercholesterolemia, unspecified: Secondary | ICD-10-CM

## 2021-11-29 DIAGNOSIS — I5042 Chronic combined systolic (congestive) and diastolic (congestive) heart failure: Secondary | ICD-10-CM

## 2021-11-29 DIAGNOSIS — I059 Rheumatic mitral valve disease, unspecified: Secondary | ICD-10-CM | POA: Diagnosis not present

## 2021-11-29 DIAGNOSIS — I4821 Permanent atrial fibrillation: Secondary | ICD-10-CM

## 2021-11-29 NOTE — Progress Notes (Signed)
Chief Complaint  Patient presents with   Follow-up    Systolic CHF, MV disease   History of Present Illness: 88 yo male with history of severe mitral regurgitation s/p MVR in 1986, permanent atrial fibrillation, pulmonary HTN, HTN, HLD, DM, CKD stage III, and prior TIA who is here today for cardiac follow up. He has been followed by Dr. Saunders Revel. I saw him for the first time in August 2020. He had MVR in 1986. He has been on coumadin. He has had prior TIA and has been on ASA and dypridamole. Syncope in 2020. Echo in December 2020 with LVEF=55-60%, moderate LVH, MVR with moderate MR. Cardiac monitor in 2020 with atrial fib, occasional PVCs, NSVT. He was admitted in September 2022 for an appendectomy. He developed an abscess and was readmitted in October 2022 for treatment. He then developed C diff colitis and was admitted for several weeks. His norvasc and Lisinopril were stopped due to hypotension. I saw him in December 2022 and he c/o worsened LE edema. We discussed increasing his Lasix to 40 mg BID for several days. He was admitted that night to [facility] with worsened diarrhea and found to be C. Diff positive again. Echo during that visit with LVEF=45-50%.  He was diuresed with IV lasix and started on Entresto. He was admitted to [facility] in December 2022 with C diff colitis. Echo 04/17/21 with LVEF=40% with global hypokinesis. Moderate RV dysfunction with dilated RV. Severe dilation both atria. Normally functioning mitral valve replacement with mild MR. He had worsened LE edema, dyspnea and fatigue in July 2023. He was felt to be volume overloaded when he was seen by Dr. Alain Marion and his Lasix was changed to Torsemide. Chest x-ray 10/25/21 with evidence of pulmonary edema and right pleural effusion. I saw him in the office on 10/30/21 and he reported improvement in his dyspnea and LE edema. Echo 11/08/21 with LVEF=35% with global hypokinesis. Mechanical MV working well.   He is here today for follow up. The patient  denies any chest pain, dyspnea, palpitations, lower extremity edema, orthopnea, PND, dizziness, near syncope or syncope.   Primary Care Physician: Cassandria Anger, MD  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   NSVT (nonsustained ventricular tachycardia) (HCC)    Peripheral edema    venous insufficiency   Permanent atrial fibrillation ([place])    Pulmonary hypertension ([place])    Syncope    TIA (transient ischemic attack)    while on coumadin   Type II or unspecified type diabetes mellitus without mention of complication, not stated as uncontrolled    lifestyle mangement   Ventricular ectopy    symptomatic    Past Surgical History:  Procedure Laterality Date   CHOLECYSTECTOMY, LAPAROSCOPIC  2003   IR PATIENT EVAL TECH 0-60 MINS  01/09/2021   IR RADIOLOGIST EVAL & MGMT  01/19/2021   IR RADIOLOGIST EVAL & MGMT  02/02/2021   LAPAROSCOPIC APPENDECTOMY N/A 12/05/2020   Procedure: APPENDECTOMY LAPAROSCOPIC;  Surgeon: Stark Klein, MD;  Location: [facility];  Service: General;  Laterality: N/A;   MITRAL VALVE REPLACEMENT     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    Current Outpatient Medications  Medication Sig Dispense Refill   acetaminophen (  TYLENOL) 500 MG tablet Take 500 mg by mouth every 6 (six) hours as needed for fever.     allopurinol (ZYLOPRIM) 100 MG tablet Take 0.5 tablets (50 mg total) by mouth daily. 90 tablet 3   aspirin 81 MG EC tablet Take 81 mg by mouth daily.     bisoprolol (ZEBETA) 5 MG tablet Take 1 tablet (5 mg total) by mouth daily. 90 tablet 3   Cholecalciferol (VITAMIN D3) 50 MCG (2000 UT) capsule TAKE 1 CAPSULE BY MOUTH EVERY DAY (Patient taking differently: Take 2,000 Units by mouth daily.) 100 capsule 3   dipyridamole (PERSANTINE) 50 MG  tablet Take 1 tablet (50 mg total) by mouth 3 (three) times daily. 270 tablet 3   ferrous [id number] C-folic acid (TRINSICON / FOLTRIN) capsule Take 1 capsule by mouth 2 (two) times daily after a meal. (Patient taking differently: Take 1 capsule by mouth daily.) 60 capsule 1   HYDROcodone-acetaminophen (NORCO) 7.5-325 MG tablet TAKE 1/2 TO 1 TABLET BY MOUTH EVERY 6 HOURS AS NEEDED FOR MODERATE PAIN     latanoprost (XALATAN) 0.005 % ophthalmic solution Place 1 drop into both eyes at bedtime.     methylPREDNISolone (MEDROL DOSEPAK) 4 MG TBPK tablet As directed for gout attack 21 tablet 0   pantoprazole (PROTONIX) 40 MG tablet Take 1 tablet by mouth daily.     sacubitril-valsartan (ENTRESTO) 49-51 MG Take 1 tablet by mouth 2 (two) times daily. 60 tablet 1   torsemide 40 MG TABS Take 40 mg by mouth daily. 30 tablet 1   triamcinolone cream (KENALOG) 0.1 % Apply 1 application topically 4 (four) times daily. On rash 450 g 1   warfarin (COUMADIN) 5 MG tablet Take 1 to 1&1/2 tablets by mouth daily as directed by the Anticoagulation Clinic. 130 tablet 1   No current facility-administered medications for this visit.    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  Tobacco Use   Smoking status: Never   Smokeless tobacco: Never  Substance and Sexual Activity   Alcohol use: No    Alcohol/week: 0.0 standard drinks of alcohol   Drug use: No   Sexual activity: Not on file  Other Topics  Concern   Not on file  Social History Narrative   HSG, many management courses Married '58, 2 sons - '61, '62   Social Determinants of Health   Financial Resource Strain: Not on file  Food Insecurity: No Food Insecurity (05/22/2021)   Hunger Vital Sign    Worried About Running Out of Food in the Last Year: Never true    [place] in the Last Year: Never true  Transportation Needs: No Transportation Needs (05/22/2021)   PRAPARE - Hydrologist (Medical): No    Lack of Transportation (Non-Medical): No  Physical Activity: Not on file  Stress: Not on file  Social Connections: Not on file  Intimate Partner Violence: Not on file    Family History  Problem Relation Age of Onset   Coronary artery disease Father    Diabetes Other    Coronary artery disease Other    Prostate cancer Neg Hx    Colon cancer Neg Hx     Review of Systems:  As stated in the HPI and otherwise negative.   BP 122/62   Pulse 77   Ht '5\' 9"'$  (1.753 m)   Wt 166 lb 12.8 oz (75.7 kg)   SpO2 98%   BMI 24.63 kg/m   Physical Examination:  General: Well developed, well nourished, NAD  HEENT: OP clear, mucus membranes moist  SKIN: warm, dry. No rashes. Neuro: No focal deficits  Musculoskeletal: Muscle strength 5/5 all ext  Psychiatric: Mood and affect normal  Neck: No JVD, no carotid bruits, no thyromegaly, no lymphadenopathy.  Lungs:Clear bilaterally, no wheezes, rhonci, crackles Cardiovascular: Regular rate and rhythm. No murmurs, gallops or rubs. Abdomen:Soft. Bowel sounds present. Non-tender.  Extremities: Trace bilateral LE edema.   EKG:  EKG is not ordered today. The ekg ordered today demonstrates   Echo 11/08/21:  1. Left ventricular ejection fraction, by estimation, is 30 to 35%. The  left ventricle has moderately decreased function. The left ventricle  demonstrates global hypokinesis. Left ventricular diastolic parameters are  indeterminate.   2. Right  ventricular systolic function is mildly reduced. The right  ventricular size is severely enlarged. There is mildly elevated pulmonary  artery systolic pressure. The estimated right ventricular systolic  pressure is 40.9 mmHg.   3. Left atrial size was severely dilated.   4. Right atrial size was severely dilated.   5. Mechanical mitral valve (Medtronic-Hall tilting disc). Mean gradient 3  mmHg, no significant mitral regurgitation noted.   6. The tricuspid valve is abnormal. Tricuspid valve regurgitation is  severe.   7. The aortic valve is tricuspid. Aortic valve regurgitation is trivial.  No aortic stenosis is present.   8. Aortic dilatation noted. There is mild dilatation of the ascending  aorta, measuring 39 mm.   9. The inferior vena cava is dilated in size with <50% respiratory  variability, suggesting right atrial pressure of 15 mmHg.   Recent Labs: 02/04/2021: B Natriuretic Peptide 660.5; TSH 6.199 03/03/2021: Magnesium 1.8 10/25/2021: ALT 9; Hemoglobin 11.2; Platelets 146.0 11/20/2021: BUN 32; Creatinine, Ser 1.31; Potassium 4.0; Sodium 136   Lipid Panel    Component Value Date/Time   CHOL 153 05/01/2021 0802   TRIG 73 05/01/2021 0802   HDL 66 05/01/2021 0802   CHOLHDL 2.3 05/01/2021 0802   CHOLHDL 2 04/01/2020 0808   VLDL 19.2 04/01/2020 0808   LDLCALC 73 05/01/2021 0802     Wt Readings from Last 3 Encounters:  11/29/21 166 lb 12.8 oz (75.7 kg)  11/22/21 172 lb 12.8 oz (78.4 kg)  11/13/21 174 lb (78.9 kg)    Assessment and Plan:   1. Persistent atrial fibrillation: Atrial fib with good rate control today. Continue bisoprolol and coumadin.      2. Mitral valve disease: He is s/p mechanical mitral valve replacement. This is working well by echo in August 2023. Will continue ASA and coumadin. He will use SBE prophylaxis as needed prior to dental procedures.   3. HTN: BP is controlled. No changes  4. HLD: Lipids followed in primary care. LDL near goal in January 2023.  Will continue statin.    5. Cardiomyopathy/Chronic systolic CHF: Weight is down 8 lbs in last 16  days. LE edema is much improved. Continue Torsemide 40 mg per day with extra 20 mg as needed for weight gain or LE edema. BMET today  Labs/ tests ordered today include:   Orders Placed This Encounter  Procedures   Basic Metabolic Panel (BMET)   Disposition:   F/U with me or office APP in 3 months.   Signed, Lauree Chandler, MD 11/29/2021 3:30 PM    [address], [place], [place]  [address] Phone: [phone]; Fax: [phone]

## 2021-11-29 NOTE — Patient Instructions (Signed)
Medication Instructions:  Your physician recommends that you continue on your current medications as directed. Please refer to the Current Medication list given to you today.  *If you need a refill on your cardiac medications before your next appointment, please call your pharmacy*   Lab Work: Lab work to be done today--BMP If you have labs (blood work) drawn today and your tests are completely normal, you will receive your results only by: [place] (if you have MyChart) OR A paper copy in the mail If you have any lab test that is abnormal or we need to change your treatment, we will call you to review the results.   Testing/Procedures: none   Follow-Up: At [facility], you and your health needs are our priority.  As part of our continuing mission to provide you with exceptional heart care, we have created designated Provider Care Teams.  These Care Teams include your primary Cardiologist (physician) and Advanced Practice Providers (APPs -  Physician Assistants and Nurse Practitioners) who all work together to provide you with the care you need, when you need it.  We recommend signing up for the patient portal called "MyChart".  Sign up information is provided on this After Visit Summary.  MyChart is used to connect with patients for Virtual Visits (Telemedicine).  Patients are able to view lab/test results, encounter notes, upcoming appointments, etc.  Non-urgent messages can be sent to your provider as well.   To learn more about what you can do with MyChart, go to [email or web address].    Your next appointment:   April 02, 2022 at 3:00  The format for your next appointment:   In Person  Provider:   Lauree Chandler, MD     Other Instructions    Important Information About Sugar

## 2021-11-30 LAB — BASIC METABOLIC PANEL
BUN/Creatinine Ratio: 20 (ref 10–24)
BUN: 27 mg/dL (ref 8–27)
CO2: 22 mmol/L (ref 20–29)
Calcium: 9 mg/dL (ref 8.6–10.2)
Chloride: 97 mmol/L (ref 96–106)
Creatinine, Ser: 1.33 mg/dL — ABNORMAL HIGH (ref 0.76–1.27)
Glucose: 110 mg/dL — ABNORMAL HIGH (ref 70–99)
Potassium: 3.6 mmol/L (ref 3.5–5.2)
Sodium: 137 mmol/L (ref 134–144)
eGFR: 52 mL/min/{1.73_m2} — ABNORMAL LOW (ref >59)

## 2021-12-01 DIAGNOSIS — R011 Cardiac murmur, unspecified: Secondary | ICD-10-CM | POA: Diagnosis not present

## 2021-12-06 ENCOUNTER — Ambulatory Visit: Payer: Medicare Other | Attending: Cardiovascular Disease | Admitting: *Deleted

## 2021-12-06 DIAGNOSIS — I4891 Unspecified atrial fibrillation: Secondary | ICD-10-CM

## 2021-12-06 DIAGNOSIS — I480 Paroxysmal atrial fibrillation: Secondary | ICD-10-CM | POA: Diagnosis not present

## 2021-12-06 DIAGNOSIS — I6350 Cerebral infarction due to unspecified occlusion or stenosis of unspecified cerebral artery: Secondary | ICD-10-CM

## 2021-12-06 DIAGNOSIS — I059 Rheumatic mitral valve disease, unspecified: Secondary | ICD-10-CM

## 2021-12-06 DIAGNOSIS — Z7901 Long term (current) use of anticoagulants: Secondary | ICD-10-CM | POA: Diagnosis not present

## 2021-12-06 DIAGNOSIS — Z9889 Other specified postprocedural states: Secondary | ICD-10-CM | POA: Diagnosis not present

## 2021-12-06 DIAGNOSIS — G459 Transient cerebral ischemic attack, unspecified: Secondary | ICD-10-CM

## 2021-12-06 LAB — POCT INR: INR: 3.2 — AB (ref 2.0–3.0)

## 2021-12-06 NOTE — Patient Instructions (Signed)
Description   Continue taking 1 tablet daily EXCEPT 0.5 tablet on Monday AND Friday. Recheck in 3 weeks. [facility] [phone] or [phone]

## 2021-12-25 ENCOUNTER — Other Ambulatory Visit: Payer: Self-pay | Admitting: Nurse Practitioner

## 2021-12-27 ENCOUNTER — Ambulatory Visit: Payer: Medicare Other | Attending: Cardiology

## 2021-12-27 DIAGNOSIS — Z7901 Long term (current) use of anticoagulants: Secondary | ICD-10-CM

## 2021-12-27 DIAGNOSIS — I480 Paroxysmal atrial fibrillation: Secondary | ICD-10-CM

## 2021-12-27 DIAGNOSIS — I4891 Unspecified atrial fibrillation: Secondary | ICD-10-CM | POA: Diagnosis not present

## 2021-12-27 DIAGNOSIS — Z9889 Other specified postprocedural states: Secondary | ICD-10-CM

## 2021-12-27 DIAGNOSIS — I059 Rheumatic mitral valve disease, unspecified: Secondary | ICD-10-CM | POA: Diagnosis not present

## 2021-12-27 DIAGNOSIS — I6350 Cerebral infarction due to unspecified occlusion or stenosis of unspecified cerebral artery: Secondary | ICD-10-CM | POA: Diagnosis not present

## 2021-12-27 LAB — POCT INR: INR: 2.2 (ref 2.0–3.0)

## 2021-12-27 NOTE — Patient Instructions (Signed)
Description   Take 1.5 tablets today and 1.5 tablets tomorrow and then continue taking 1 tablet daily EXCEPT 0.5 tablet on Monday AND Friday.  Recheck in 2 weeks. [facility] [phone]

## 2022-01-03 ENCOUNTER — Other Ambulatory Visit (INDEPENDENT_AMBULATORY_CARE_PROVIDER_SITE_OTHER): Payer: Medicare Other

## 2022-01-03 DIAGNOSIS — I5082 Biventricular heart failure: Secondary | ICD-10-CM | POA: Diagnosis not present

## 2022-01-03 LAB — COMPREHENSIVE METABOLIC PANEL
ALT: 13 U/L (ref 0–53)
AST: 27 U/L (ref 0–37)
Albumin: 3.9 g/dL (ref 3.5–5.2)
Alkaline Phosphatase: 86 U/L (ref 39–117)
BUN: 37 mg/dL — ABNORMAL HIGH (ref 6–23)
CO2: 28 mEq/L (ref 19–32)
Calcium: 9.1 mg/dL (ref 8.4–10.5)
Chloride: 102 mEq/L (ref 96–112)
Creatinine, Ser: 1.38 mg/dL (ref 0.40–1.50)
GFR: 46.31 mL/min — ABNORMAL LOW (ref >60.00)
Glucose, Bld: 94 mg/dL (ref 70–99)
Potassium: 3.5 mEq/L (ref 3.5–5.1)
Sodium: 138 mEq/L (ref 135–145)
Total Bilirubin: 1 mg/dL (ref 0.2–1.2)
Total Protein: 7.3 g/dL (ref 6.0–8.3)

## 2022-01-08 ENCOUNTER — Encounter: Payer: Self-pay | Admitting: Internal Medicine

## 2022-01-08 ENCOUNTER — Ambulatory Visit (INDEPENDENT_AMBULATORY_CARE_PROVIDER_SITE_OTHER): Payer: Medicare Other | Admitting: Internal Medicine

## 2022-01-08 VITALS — BP 120/62 | HR 71 | Temp 97.8°F | Ht 69.0 in | Wt 156.4 lb

## 2022-01-08 DIAGNOSIS — I480 Paroxysmal atrial fibrillation: Secondary | ICD-10-CM

## 2022-01-08 DIAGNOSIS — M79605 Pain in left leg: Secondary | ICD-10-CM | POA: Diagnosis not present

## 2022-01-08 DIAGNOSIS — N183 Chronic kidney disease, stage 3 unspecified: Secondary | ICD-10-CM | POA: Diagnosis not present

## 2022-01-08 DIAGNOSIS — M79606 Pain in leg, unspecified: Secondary | ICD-10-CM | POA: Insufficient documentation

## 2022-01-08 DIAGNOSIS — I5082 Biventricular heart failure: Secondary | ICD-10-CM

## 2022-01-08 MED ORDER — HYDROCODONE-ACETAMINOPHEN 7.5-325 MG PO TABS: 0.5000 | ORAL_TABLET | Freq: Four times a day (QID) | ORAL | 0 refills | Status: DC | PRN

## 2022-01-08 NOTE — Assessment & Plan Note (Signed)
L knee and hip pain [name] will see Dr Wynelle Link

## 2022-01-08 NOTE — Progress Notes (Signed)
Subjective:  Patient ID: Anthony Gould, male    DOB: 16-Aug-1933  Age: 88 y.o. MRN: [id number]  CC: Follow-up (6 week f/u)   HPI Anthony Gould presents for CHF, edema, GERD, CRI On Torsemide 80 mg/d C/o L knee and L hip pain  Outpatient Medications Prior to Visit  Medication Sig Dispense Refill   acetaminophen (TYLENOL) 500 MG tablet Take 500 mg by mouth every 6 (six) hours as needed for fever.     allopurinol (ZYLOPRIM) 100 MG tablet Take 0.5 tablets (50 mg total) by mouth daily. 90 tablet 3   aspirin 81 MG EC tablet Take 81 mg by mouth daily.     bisoprolol (ZEBETA) 5 MG tablet Take 1 tablet (5 mg total) by mouth daily. 90 tablet 3   Cholecalciferol (VITAMIN D3) 50 MCG (2000 UT) capsule TAKE 1 CAPSULE BY MOUTH EVERY DAY (Patient taking differently: Take 2,000 Units by mouth daily.) 100 capsule 3   dipyridamole (PERSANTINE) 50 MG tablet Take 1 tablet (50 mg total) by mouth 3 (three) times daily. 270 tablet 3   latanoprost (XALATAN) 0.005 % ophthalmic solution Place 1 drop into both eyes at bedtime.     sacubitril-valsartan (ENTRESTO) 49-51 MG Take 1 tablet by mouth 2 (two) times daily. Please keep upcoming appointment for future refills. Thank you. 60 tablet 2   torsemide 40 MG TABS Take 40 mg by mouth daily. 30 tablet 1   triamcinolone cream (KENALOG) 0.1 % Apply 1 application topically 4 (four) times daily. On rash 450 g 1   warfarin (COUMADIN) 5 MG tablet Take 1 to 1&1/2 tablets by mouth daily as directed by the Anticoagulation Clinic. 130 tablet 1   HYDROcodone-acetaminophen (NORCO) 7.5-325 MG tablet TAKE 1/2 TO 1 TABLET BY MOUTH EVERY 6 HOURS AS NEEDED FOR MODERATE PAIN     methylPREDNISolone (MEDROL DOSEPAK) 4 MG TBPK tablet As directed for gout attack (Patient not taking: Reported on 01/08/2022) 21 tablet 0   pantoprazole (PROTONIX) 40 MG tablet Take 1 tablet by mouth daily. (Patient not taking: Reported on 01/08/2022)     ferrous [id number] C-folic acid  (TRINSICON / FOLTRIN) capsule Take 1 capsule by mouth 2 (two) times daily after a meal. (Patient not taking: Reported on 01/08/2022) 60 capsule 1   No facility-administered medications prior to visit.    ROS: Review of Systems  Constitutional:  Positive for unexpected weight change. Negative for appetite change and fatigue.  HENT:  Negative for congestion, nosebleeds, sneezing, sore throat and trouble swallowing.   Eyes:  Negative for itching and visual disturbance.  Respiratory:  Negative for cough, shortness of breath and wheezing.   Cardiovascular:  Negative for chest pain, palpitations and leg swelling.  Gastrointestinal:  Negative for abdominal distention, blood in stool, diarrhea and nausea.  Genitourinary:  Negative for frequency and hematuria.  Musculoskeletal:  Negative for back pain, gait problem, joint swelling and neck pain.  Skin:  Negative for rash.  Neurological:  Negative for dizziness, tremors, speech difficulty and weakness.  Psychiatric/Behavioral:  Negative for agitation, dysphoric mood and sleep disturbance. The patient is not nervous/anxious.     Objective:  BP 120/62 (BP Location: Left Arm)   Pulse 71   Temp 97.8 F (36.6 C) (Oral)   Ht '5\' 9"'$  (1.753 m)   Wt 156 lb 6.4 oz (70.9 kg)   SpO2 97%   BMI 23.10 kg/m   BP Readings from Last 3 Encounters:  01/08/22 120/62  11/29/21 122/62  11/22/21 130/68  Wt Readings from Last 3 Encounters:  01/08/22 156 lb 6.4 oz (70.9 kg)  11/29/21 166 lb 12.8 oz (75.7 kg)  11/22/21 172 lb 12.8 oz (78.4 kg)    Physical Exam Constitutional:      General: He is not in acute distress.    Appearance: Normal appearance. He is well-developed.     Comments: NAD  Eyes:     Conjunctiva/sclera: Conjunctivae normal.     Pupils: Pupils are equal, round, and reactive to light.  Neck:     Thyroid: No thyromegaly.     Vascular: No JVD.  Cardiovascular:     Rate and Rhythm: Normal rate and regular rhythm.     Heart sounds:  Normal heart sounds. No murmur heard.    No friction rub. No gallop.  Pulmonary:     Effort: Pulmonary effort is normal. No respiratory distress.     Breath sounds: Normal breath sounds. No wheezing or rales.  Chest:     Chest wall: No tenderness.  Abdominal:     General: Bowel sounds are normal. There is no distension.     Palpations: Abdomen is soft. There is no mass.     Tenderness: There is no abdominal tenderness. There is no guarding or rebound.  Musculoskeletal:        General: No tenderness. Normal range of motion.     Cervical back: Normal range of motion.  Lymphadenopathy:     Cervical: No cervical adenopathy.  Skin:    General: Skin is warm and dry.     Findings: No rash.  Neurological:     Mental Status: He is alert and oriented to person, place, and time.     Cranial Nerves: No cranial nerve deficit.     Motor: No abnormal muscle tone.     Coordination: Coordination normal.     Gait: Gait normal.     Deep Tendon Reflexes: Reflexes are normal and symmetric.  Psychiatric:        Behavior: Behavior normal.        Thought Content: Thought content normal.        Judgment: Judgment normal.     Lab Results  Component Value Date   WBC 4.2 10/25/2021   HGB 11.2 (L) 10/25/2021   HCT 33.7 (L) 10/25/2021   PLT 146.0 (L) 10/25/2021   GLUCOSE 94 01/03/2022   CHOL 153 05/01/2021   TRIG 73 05/01/2021   HDL 66 05/01/2021   LDLCALC 73 05/01/2021   ALT 13 01/03/2022   AST 27 01/03/2022   NA 138 01/03/2022   K 3.5 01/03/2022   CL 102 01/03/2022   CREATININE 1.38 01/03/2022   BUN 37 (H) 01/03/2022   CO2 28 01/03/2022   TSH 6.199 (H) 02/04/2021   PSA 0.87 10/20/2014   INR 2.2 12/27/2021   HGBA1C 4.9 02/04/2021    ECHOCARDIOGRAM COMPLETE  Result Date: 02/27/2021    ECHOCARDIOGRAM REPORT   Patient Name:   Anthony Gould Date of Exam: 02/27/2021 Medical Rec #:  [id number]         Height:       69.0 in Accession #:    [id number]        Weight:       167.6 lb Date of  Birth:  01/27/1934         BSA:          1.917 m Patient Age:    55 years  BP:           124/56 mmHg Patient Gender: M                 HR:           69 bpm. Exam Location:  Inpatient Procedure: 2D Echo, 3D Echo, Cardiac Doppler and Color Doppler Indications:     I50.40* Unspecified combined systolic (congestive) and                  diastolic (congestive) heart failure  History:         Patient has prior history of Echocardiogram examinations, most                  recent 03/09/2019. CHF, Abnormal ECG, Pulmonary HTN and TIA,                  Mitral Valve Disease, Arrythmias:Atrial Fibrillation and NSVT,                  Signs/Symptoms:Chest Pain and Syncope; Risk                  Factors:Hypertension and Diabetes. Mechanical mitral valve.                   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 Procedure Date: 28.  Sonographer:     Roseanna Rainbow RDCS Referring Phys:  [id number] [place] Diagnosing Phys: Oswaldo Milian MD  Sonographer Comments: Technically difficult study due to poor echo windows. IMPRESSIONS  1. Left ventricular ejection fraction, by estimation, is 40 to 45%. The left ventricle has mildly decreased function. The left ventricle demonstrates regional wall motion abnormalities (see scoring diagram/findings for description). Septal hypokinesis. There is moderate left ventricular hypertrophy. Left ventricular diastolic parameters are indeterminate.  2. Right ventricular systolic function is moderately reduced. The right ventricular size is moderately enlarged. There is moderately elevated pulmonary artery systolic pressure. The estimated right ventricular systolic pressure is 22.0 mmHg.  3. Left atrial size was severely dilated.  4. Right atrial size was severely dilated.  5. 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 Procedure Date: 64. Trivial mitral valve regurgitation.     MG 79mHg  at 63bpm, EOA 2.4 cm^2  6. The tricuspid valve is abnormal. Tricuspid valve regurgitation is severe.  7. The aortic valve is tricuspid. Aortic valve regurgitation is trivial. Aortic valve sclerosis is present, with no evidence of aortic valve stenosis.  8. The inferior vena cava is dilated in size with <50% respiratory variability, suggesting right atrial pressure of 15 mmHg. FINDINGS  Left Ventricle: Left ventricular ejection fraction, by estimation, is 40 to 45%. The left ventricle has mildly decreased function. The left ventricle demonstrates regional wall motion abnormalities. The left ventricular internal cavity size was normal in size. There is moderate left ventricular hypertrophy. Left ventricular diastolic parameters are indeterminate.  LV Wall Scoring: The entire septum is hypokinetic. The entire anterior wall, entire lateral wall, entire inferior wall, and apex are normal. Right Ventricle: The right ventricular size is moderately enlarged. Right vetricular wall thickness was not well visualized. Right ventricular systolic function is moderately reduced. There is moderately elevated pulmonary artery systolic pressure. The tricuspid regurgitant velocity is 3.09 m/s, and with an assumed right atrial pressure of 15 mmHg, the estimated right  ventricular systolic pressure is 82.9 mmHg. Left Atrium: Left atrial size was severely dilated. Right Atrium: Right atrial size was severely dilated. Pericardium: There is no evidence of pericardial effusion. Mitral Valve: The mitral valve has been repaired/replaced. Trivial mitral valve regurgitation. There is a unknown size Medtronic tilting disc valve present in the mitral position. Procedure Date: 15. MV peak gradient, 13.5 mmHg. The mean mitral valve gradient is 4.0 mmHg. Tricuspid Valve: The tricuspid valve is abnormal. Tricuspid valve regurgitation is severe. Aortic Valve: The aortic valve is tricuspid. Aortic valve regurgitation is trivial. Aortic valve sclerosis  is present, with no evidence of aortic valve stenosis. Pulmonic Valve: The pulmonic valve was not well visualized. Pulmonic valve regurgitation is trivial. Aorta: The aortic root and ascending aorta are structurally normal, with no evidence of dilitation. Venous: The inferior vena cava is dilated in size with less than 50% respiratory variability, suggesting right atrial pressure of 15 mmHg. IAS/Shunts: The interatrial septum was not well visualized.  LEFT VENTRICLE PLAX 2D LVIDd:         5.10 cm      Diastology LVIDs:         4.00 cm      LV e' medial:    7.40 cm/s LV PW:         2.00 cm      LV E/e' medial:  22.2 LV IVS:        0.80 cm      LV e' lateral:   7.29 cm/s LVOT diam:     2.40 cm      LV E/e' lateral: 22.5 LV SV:         97 LV SV Index:   51 LVOT Area:     4.52 cm  LV Volumes (MOD) LV vol d, MOD A2C: 135.0 ml LV vol d, MOD A4C: 105.0 ml LV vol s, MOD A2C: 69.9 ml LV vol s, MOD A4C: 59.0 ml LV SV MOD A2C:     65.1 ml LV SV MOD A4C:     105.0 ml LV SV MOD BP:      53.6 ml RIGHT VENTRICLE            IVC RV S prime:     8.33 cm/s  IVC diam: 2.70 cm TAPSE (M-mode): 1.4 cm LEFT ATRIUM            Index        RIGHT ATRIUM           Index LA diam:      5.80 cm  3.03 cm/m   RA Area:     39.60 cm LA Vol (A2C): 72.1 ml  37.62 ml/m  RA Volume:   166.00 ml 86.62 ml/m LA Vol (A4C): 140.0 ml 73.05 ml/m  AORTIC VALVE             PULMONIC VALVE LVOT Vmax:   104.00 cm/s PR End Diast Vel: 2.22 msec LVOT Vmean:  67.300 cm/s LVOT VTI:    0.215 m  AORTA Ao Root diam: 3.60 cm Ao Asc diam:  3.50 cm MITRAL VALVE                TRICUSPID VALVE MV Area (PHT): 3.48 cm     TR Peak grad:   38.2 mmHg MV Area VTI:   2.61 cm     TR Vmax:        309.00 cm/s MV Peak grad:  13.5 mmHg MV Mean grad:  4.0 mmHg  SHUNTS MV Vmax:       1.84 m/s     Systemic VTI:  0.22 m MV Vmean:      88.9 cm/s    Systemic Diam: 2.40 cm MV Decel Time: 218 msec MV E velocity: 164.00 cm/s Oswaldo Milian MD Electronically signed by Oswaldo Milian MD Signature Date/Time: 02/27/2021/6:41:19 PM    Final (Updated)     Assessment & Plan:   Problem List Items Addressed This Visit     Biventricular heart failure (HCC)    Better Reduce Torsemide to 40 or 60 mg/d (using 80 mg/d now). Cont w/Entresto 49/51      CRI (chronic renal insufficiency), stage 3 (moderate) (HCC)    Reduce Torsemide to 40 or 60 mg/d (using 80 mg/d now). Cont w/Entresto 49/51 Hydrate well      Relevant Orders   CBC with Differential/Platelet   Comprehensive metabolic panel   Leg pain - Primary    L knee and hip pain [name] will see Dr Wynelle Link       Relevant Orders   Ambulatory referral to Orthopedic Surgery   PAF (paroxysmal atrial fibrillation) ([place])    In NSR      Relevant Orders   CBC with Differential/Platelet   Comprehensive metabolic panel      Meds ordered this encounter  Medications   HYDROcodone-acetaminophen (NORCO) 7.5-325 MG tablet    Sig: Take 0.5-1 tablets by mouth every 6 (six) hours as needed.    Dispense:  60 tablet    Refill:  0      Follow-up: Return in about 6 weeks (around 02/19/2022) for a follow-up visit.  Walker Kehr, MD

## 2022-01-08 NOTE — Assessment & Plan Note (Signed)
In NSR 

## 2022-01-08 NOTE — Assessment & Plan Note (Signed)
Reduce Torsemide to 40 or 60 mg/d (using 80 mg/d now). Cont w/Entresto 49/51 Hydrate well

## 2022-01-08 NOTE — Patient Instructions (Signed)
TakeTorsemide 40 mg per day with extra 20 mg as needed for weight gain or swelling

## 2022-01-08 NOTE — Assessment & Plan Note (Signed)
Better Reduce Torsemide to 40 or 60 mg/d (using 80 mg/d now). Cont w/Entresto 49/51

## 2022-01-10 ENCOUNTER — Ambulatory Visit: Payer: Medicare Other | Attending: Cardiovascular Disease

## 2022-01-10 DIAGNOSIS — I6350 Cerebral infarction due to unspecified occlusion or stenosis of unspecified cerebral artery: Secondary | ICD-10-CM

## 2022-01-10 DIAGNOSIS — Z7901 Long term (current) use of anticoagulants: Secondary | ICD-10-CM | POA: Diagnosis not present

## 2022-01-10 DIAGNOSIS — I059 Rheumatic mitral valve disease, unspecified: Secondary | ICD-10-CM

## 2022-01-10 DIAGNOSIS — I4891 Unspecified atrial fibrillation: Secondary | ICD-10-CM

## 2022-01-10 DIAGNOSIS — Z9889 Other specified postprocedural states: Secondary | ICD-10-CM

## 2022-01-10 LAB — POCT INR: INR: 2.2 (ref 2.0–3.0)

## 2022-01-10 NOTE — Patient Instructions (Signed)
Description   Take 1.5 tablets today and then START taking 1 tablet daily.  Recheck in 2 weeks. [facility] [phone]

## 2022-01-21 DIAGNOSIS — J069 Acute upper respiratory infection, unspecified: Secondary | ICD-10-CM | POA: Diagnosis not present

## 2022-01-24 ENCOUNTER — Ambulatory Visit: Payer: Medicare Other | Attending: Cardiovascular Disease

## 2022-01-24 DIAGNOSIS — I059 Rheumatic mitral valve disease, unspecified: Secondary | ICD-10-CM | POA: Diagnosis not present

## 2022-01-24 DIAGNOSIS — Z7901 Long term (current) use of anticoagulants: Secondary | ICD-10-CM

## 2022-01-24 DIAGNOSIS — I6350 Cerebral infarction due to unspecified occlusion or stenosis of unspecified cerebral artery: Secondary | ICD-10-CM

## 2022-01-24 DIAGNOSIS — I480 Paroxysmal atrial fibrillation: Secondary | ICD-10-CM | POA: Diagnosis not present

## 2022-01-24 DIAGNOSIS — Z9889 Other specified postprocedural states: Secondary | ICD-10-CM

## 2022-01-24 DIAGNOSIS — I4891 Unspecified atrial fibrillation: Secondary | ICD-10-CM

## 2022-01-24 DIAGNOSIS — Z5181 Encounter for therapeutic drug level monitoring: Secondary | ICD-10-CM

## 2022-01-24 DIAGNOSIS — G459 Transient cerebral ischemic attack, unspecified: Secondary | ICD-10-CM

## 2022-01-24 LAB — POCT INR: INR: 3.1 — AB (ref 2.0–3.0)

## 2022-01-24 NOTE — Patient Instructions (Signed)
Description   Continue taking 1 tablet daily.  Recheck in 3 weeks. [facility] [phone]

## 2022-02-02 DIAGNOSIS — Z01818 Encounter for other preprocedural examination: Secondary | ICD-10-CM | POA: Diagnosis not present

## 2022-02-06 DIAGNOSIS — M25562 Pain in left knee: Secondary | ICD-10-CM | POA: Insufficient documentation

## 2022-02-07 DIAGNOSIS — M25562 Pain in left knee: Secondary | ICD-10-CM | POA: Diagnosis not present

## 2022-02-07 DIAGNOSIS — M170 Bilateral primary osteoarthritis of knee: Secondary | ICD-10-CM | POA: Diagnosis not present

## 2022-02-07 DIAGNOSIS — M25552 Pain in left hip: Secondary | ICD-10-CM | POA: Diagnosis not present

## 2022-02-13 ENCOUNTER — Other Ambulatory Visit (INDEPENDENT_AMBULATORY_CARE_PROVIDER_SITE_OTHER): Payer: Medicare Other

## 2022-02-13 ENCOUNTER — Ambulatory Visit: Payer: Medicare Other | Attending: Cardiovascular Disease

## 2022-02-13 DIAGNOSIS — I480 Paroxysmal atrial fibrillation: Secondary | ICD-10-CM

## 2022-02-13 DIAGNOSIS — Z7901 Long term (current) use of anticoagulants: Secondary | ICD-10-CM

## 2022-02-13 DIAGNOSIS — N183 Chronic kidney disease, stage 3 unspecified: Secondary | ICD-10-CM

## 2022-02-13 DIAGNOSIS — Z9889 Other specified postprocedural states: Secondary | ICD-10-CM

## 2022-02-13 DIAGNOSIS — I6350 Cerebral infarction due to unspecified occlusion or stenosis of unspecified cerebral artery: Secondary | ICD-10-CM

## 2022-02-13 DIAGNOSIS — I4891 Unspecified atrial fibrillation: Secondary | ICD-10-CM

## 2022-02-13 DIAGNOSIS — I059 Rheumatic mitral valve disease, unspecified: Secondary | ICD-10-CM

## 2022-02-13 LAB — COMPREHENSIVE METABOLIC PANEL
ALT: 9 U/L (ref 0–53)
AST: 24 U/L (ref 0–37)
Albumin: 4.1 g/dL (ref 3.5–5.2)
Alkaline Phosphatase: 82 U/L (ref 39–117)
BUN: 42 mg/dL — ABNORMAL HIGH (ref 6–23)
CO2: 27 mEq/L (ref 19–32)
Calcium: 8.8 mg/dL (ref 8.4–10.5)
Chloride: 100 mEq/L (ref 96–112)
Creatinine, Ser: 1.43 mg/dL (ref 0.40–1.50)
GFR: 44.33 mL/min — ABNORMAL LOW (ref >60.00)
Glucose, Bld: 90 mg/dL (ref 70–99)
Potassium: 3.9 mEq/L (ref 3.5–5.1)
Sodium: 135 mEq/L (ref 135–145)
Total Bilirubin: 1.2 mg/dL (ref 0.2–1.2)
Total Protein: 7.2 g/dL (ref 6.0–8.3)

## 2022-02-13 LAB — CBC WITH DIFFERENTIAL/PLATELET
Basophils Absolute: 0.1 10*3/uL (ref 0.0–0.1)
Basophils Relative: 1.3 % (ref 0.0–3.0)
Eosinophils Absolute: 0.4 10*3/uL (ref 0.0–0.7)
Eosinophils Relative: 10.4 % — ABNORMAL HIGH (ref 0.0–5.0)
HCT: 35.9 % — ABNORMAL LOW (ref 39.0–52.0)
Hemoglobin: 11.9 g/dL — ABNORMAL LOW (ref 13.0–17.0)
Lymphocytes Relative: 28.3 % (ref 12.0–46.0)
Lymphs Abs: 1.1 10*3/uL (ref 0.7–4.0)
MCHC: 33.3 g/dL (ref 30.0–36.0)
MCV: 95.8 fl (ref 78.0–100.0)
Monocytes Absolute: 0.4 10*3/uL (ref 0.1–1.0)
Monocytes Relative: 9.4 % (ref 3.0–12.0)
Neutro Abs: 2 10*3/uL (ref 1.4–7.7)
Neutrophils Relative %: 50.6 % (ref 43.0–77.0)
Platelets: 123 10*3/uL — ABNORMAL LOW (ref 150.0–400.0)
RBC: 3.74 Mil/uL — ABNORMAL LOW (ref 4.22–5.81)
RDW: 17.6 % — ABNORMAL HIGH (ref 11.5–15.5)
WBC: 3.9 10*3/uL — ABNORMAL LOW (ref 4.0–10.5)

## 2022-02-13 LAB — POCT INR: INR: 3.1 — AB (ref 2.0–3.0)

## 2022-02-13 NOTE — Patient Instructions (Signed)
Continue taking 1 tablet daily.  Recheck in 4 weeks. [facility] [phone]

## 2022-02-19 ENCOUNTER — Encounter: Payer: Self-pay | Admitting: Internal Medicine

## 2022-02-19 ENCOUNTER — Ambulatory Visit (INDEPENDENT_AMBULATORY_CARE_PROVIDER_SITE_OTHER): Payer: Medicare Other | Admitting: Internal Medicine

## 2022-02-19 VITALS — BP 122/70 | HR 60 | Temp 97.6°F | Ht 69.0 in | Wt 159.0 lb

## 2022-02-19 DIAGNOSIS — I5033 Acute on chronic diastolic (congestive) heart failure: Secondary | ICD-10-CM | POA: Diagnosis not present

## 2022-02-19 DIAGNOSIS — R197 Diarrhea, unspecified: Secondary | ICD-10-CM

## 2022-02-19 DIAGNOSIS — I480 Paroxysmal atrial fibrillation: Secondary | ICD-10-CM | POA: Diagnosis not present

## 2022-02-19 DIAGNOSIS — N183 Chronic kidney disease, stage 3 unspecified: Secondary | ICD-10-CM | POA: Diagnosis not present

## 2022-02-19 DIAGNOSIS — M25562 Pain in left knee: Secondary | ICD-10-CM | POA: Diagnosis not present

## 2022-02-19 MED ORDER — TORSEMIDE 20 MG PO TABS: 60.0000 mg | ORAL_TABLET | Freq: Every day | ORAL | 3 refills | Status: DC

## 2022-02-19 NOTE — Assessment & Plan Note (Signed)
F/u w/Ortho S/p steroid shot Norco prn

## 2022-02-19 NOTE — Assessment & Plan Note (Signed)
Cont w/Bisoprolol, Coumadin

## 2022-02-19 NOTE — Assessment & Plan Note (Addendum)
Edema and DOE f/u - much better on 60 mg Torsemide a day Cont Entresto Check labs - CMET   F/u w/Dr Angelena Form in Jan

## 2022-02-19 NOTE — Progress Notes (Signed)
Subjective:  Patient ID: Anthony Gould, male    DOB: 02-29-1934  Age: 88 y.o. MRN: [id number]  CC: Follow-up   HPI Anthony Gould presents for edema and DOE f/u - much better on 60 mg Torsemide a day. F/u on CRI, A fib  Outpatient Medications Prior to Visit  Medication Sig Dispense Refill   acetaminophen (TYLENOL) 500 MG tablet Take 500 mg by mouth every 6 (six) hours as needed for fever.     allopurinol (ZYLOPRIM) 100 MG tablet Take 0.5 tablets (50 mg total) by mouth daily. 90 tablet 3   aspirin 81 MG EC tablet Take 81 mg by mouth daily.     bisoprolol (ZEBETA) 5 MG tablet Take 1 tablet (5 mg total) by mouth daily. 90 tablet 3   Cholecalciferol (VITAMIN D3) 50 MCG (2000 UT) capsule TAKE 1 CAPSULE BY MOUTH EVERY DAY (Patient taking differently: Take 2,000 Units by mouth daily.) 100 capsule 3   dipyridamole (PERSANTINE) 50 MG tablet Take 1 tablet (50 mg total) by mouth 3 (three) times daily. 270 tablet 3   HYDROcodone-acetaminophen (NORCO) 7.5-325 MG tablet Take 0.5-1 tablets by mouth every 6 (six) hours as needed. 60 tablet 0   latanoprost (XALATAN) 0.005 % ophthalmic solution Place 1 drop into both eyes at bedtime.     methylPREDNISolone (MEDROL DOSEPAK) 4 MG TBPK tablet As directed for gout attack 21 tablet 0   pantoprazole (PROTONIX) 40 MG tablet Take 1 tablet by mouth daily.     sacubitril-valsartan (ENTRESTO) 49-51 MG Take 1 tablet by mouth 2 (two) times daily. Please keep upcoming appointment for future refills. Thank you. 60 tablet 2   triamcinolone cream (KENALOG) 0.1 % Apply 1 application topically 4 (four) times daily. On rash 450 g 1   warfarin (COUMADIN) 5 MG tablet Take 1 to 1&1/2 tablets by mouth daily as directed by the Anticoagulation Clinic. 130 tablet 1   torsemide 40 MG TABS Take 40 mg by mouth daily. 30 tablet 1   No facility-administered medications prior to visit.    ROS: Review of Systems  Constitutional:  Negative for appetite change, fatigue and  unexpected weight change.  HENT:  Negative for congestion, nosebleeds, sneezing, sore throat and trouble swallowing.   Eyes:  Negative for itching and visual disturbance.  Respiratory:  Positive for wheezing. Negative for cough and shortness of breath.   Cardiovascular:  Negative for chest pain, palpitations and leg swelling.  Gastrointestinal:  Negative for abdominal distention, blood in stool, diarrhea and nausea.  Genitourinary:  Negative for frequency and hematuria.  Musculoskeletal:  Positive for myalgias. Negative for back pain, gait problem, joint swelling and neck pain.  Skin:  Negative for rash and wound.  Neurological:  Negative for dizziness, tremors, speech difficulty and weakness.  Psychiatric/Behavioral:  Negative for agitation, dysphoric mood and sleep disturbance. The patient is not nervous/anxious.     Objective:  BP 122/70 (BP Location: Left Arm, Patient Position: Sitting, Cuff Size: Normal)   Pulse 60   Temp 97.6 F (36.4 C) (Oral)   Ht '5\' 9"'$  (1.753 m)   Wt 159 lb (72.1 kg)   SpO2 99%   BMI 23.48 kg/m   BP Readings from Last 3 Encounters:  02/19/22 122/70  01/08/22 120/62  11/29/21 122/62    Wt Readings from Last 3 Encounters:  02/19/22 159 lb (72.1 kg)  01/08/22 156 lb 6.4 oz (70.9 kg)  11/29/21 166 lb 12.8 oz (75.7 kg)    Physical Exam Constitutional:  General: He is not in acute distress.    Appearance: Normal appearance. He is well-developed.     Comments: NAD  Eyes:     Conjunctiva/sclera: Conjunctivae normal.     Pupils: Pupils are equal, round, and reactive to light.  Neck:     Thyroid: No thyromegaly.     Vascular: No JVD.  Cardiovascular:     Rate and Rhythm: Normal rate and regular rhythm.     Heart sounds: Normal heart sounds. No murmur heard.    No friction rub. No gallop.  Pulmonary:     Effort: Pulmonary effort is normal. No respiratory distress.     Breath sounds: Normal breath sounds. No wheezing or rales.  Chest:     Chest  wall: No tenderness.  Abdominal:     General: Bowel sounds are normal. There is no distension.     Palpations: Abdomen is soft. There is no mass.     Tenderness: There is no abdominal tenderness. There is no guarding or rebound.  Musculoskeletal:        General: No tenderness. Normal range of motion.     Cervical back: Normal range of motion.  Lymphadenopathy:     Cervical: No cervical adenopathy.  Skin:    General: Skin is warm and dry.     Findings: No rash.  Neurological:     Mental Status: He is alert and oriented to person, place, and time.     Cranial Nerves: No cranial nerve deficit.     Motor: No abnormal muscle tone.     Coordination: Coordination normal.     Gait: Gait normal.     Deep Tendon Reflexes: Reflexes are normal and symmetric.  Psychiatric:        Behavior: Behavior normal.        Thought Content: Thought content normal.        Judgment: Judgment normal.   L knee w/pain  Lab Results  Component Value Date   WBC 3.9 (L) 02/13/2022   HGB 11.9 (L) 02/13/2022   HCT 35.9 (L) 02/13/2022   PLT 123.0 (L) 02/13/2022   GLUCOSE 90 02/13/2022   CHOL 153 05/01/2021   TRIG 73 05/01/2021   HDL 66 05/01/2021   LDLCALC 73 05/01/2021   ALT 9 02/13/2022   AST 24 02/13/2022   NA 135 02/13/2022   K 3.9 02/13/2022   CL 100 02/13/2022   CREATININE 1.43 02/13/2022   BUN 42 (H) 02/13/2022   CO2 27 02/13/2022   TSH 6.199 (H) 02/04/2021   PSA 0.87 10/20/2014   INR 3.1 (A) 02/13/2022   HGBA1C 4.9 02/04/2021    ECHOCARDIOGRAM COMPLETE  Result Date: 02/27/2021    ECHOCARDIOGRAM REPORT   Patient Name:   Anthony Gould Date of Exam: 02/27/2021 Medical Rec #:  [id number]         Height:       69.0 in Accession #:    [id number]        Weight:       167.6 lb Date of Birth:  May 07, 1933         BSA:          1.917 m Patient Age:    50 years          BP:           124/56 mmHg Patient Gender: M                 HR:  69 bpm. Exam Location:  Inpatient Procedure: 2D Echo, 3D  Echo, Cardiac Doppler and Color Doppler Indications:     I50.40* Unspecified combined systolic (congestive) and                  diastolic (congestive) heart failure  History:         Patient has prior history of Echocardiogram examinations, most                  recent 03/09/2019. CHF, Abnormal ECG, Pulmonary HTN and TIA,                  Mitral Valve Disease, Arrythmias:Atrial Fibrillation and NSVT,                  Signs/Symptoms:Chest Pain and Syncope; Risk                  Factors:Hypertension and Diabetes. Mechanical mitral valve.                   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 Procedure Date: 60.  Sonographer:     Roseanna Rainbow RDCS Referring Phys:  [id number] [place] Diagnosing Phys: Oswaldo Milian MD  Sonographer Comments: Technically difficult study due to poor echo windows. IMPRESSIONS  1. Left ventricular ejection fraction, by estimation, is 40 to 45%. The left ventricle has mildly decreased function. The left ventricle demonstrates regional wall motion abnormalities (see scoring diagram/findings for description). Septal hypokinesis. There is moderate left ventricular hypertrophy. Left ventricular diastolic parameters are indeterminate.  2. Right ventricular systolic function is moderately reduced. The right ventricular size is moderately enlarged. There is moderately elevated pulmonary artery systolic pressure. The estimated right ventricular systolic pressure is 80.9 mmHg.  3. Left atrial size was severely dilated.  4. Right atrial size was severely dilated.  5. 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 Procedure Date: 15. Trivial mitral valve regurgitation.     MG 20mHg at 63bpm, EOA 2.4 cm^2  6. The tricuspid valve is abnormal. Tricuspid valve regurgitation is severe.  7. The aortic valve is tricuspid. Aortic valve regurgitation is trivial. Aortic valve sclerosis is present,  with no evidence of aortic valve stenosis.  8. The inferior vena cava is dilated in size with <50% respiratory variability, suggesting right atrial pressure of 15 mmHg. FINDINGS  Left Ventricle: Left ventricular ejection fraction, by estimation, is 40 to 45%. The left ventricle has mildly decreased function. The left ventricle demonstrates regional wall motion abnormalities. The left ventricular internal cavity size was normal in size. There is moderate left ventricular hypertrophy. Left ventricular diastolic parameters are indeterminate.  LV Wall Scoring: The entire septum is hypokinetic. The entire anterior wall, entire lateral wall, entire inferior wall, and apex are normal. Right Ventricle: The right ventricular size is moderately enlarged. Right vetricular wall thickness was not well visualized. Right ventricular systolic function is moderately reduced. There is moderately elevated pulmonary artery systolic pressure. The tricuspid regurgitant velocity is 3.09 m/s, and with an assumed right atrial pressure of 15 mmHg, the estimated right ventricular systolic pressure is 598.3mmHg. Left Atrium: Left atrial size was severely dilated. Right Atrium: Right atrial size was severely dilated. Pericardium: There is no evidence of pericardial effusion. Mitral Valve: The mitral valve has been repaired/replaced. Trivial mitral valve regurgitation.  There is a unknown size Medtronic tilting disc valve present in the mitral position. Procedure Date: 9. MV peak gradient, 13.5 mmHg. The mean mitral valve gradient is 4.0 mmHg. Tricuspid Valve: The tricuspid valve is abnormal. Tricuspid valve regurgitation is severe. Aortic Valve: The aortic valve is tricuspid. Aortic valve regurgitation is trivial. Aortic valve sclerosis is present, with no evidence of aortic valve stenosis. Pulmonic Valve: The pulmonic valve was not well visualized. Pulmonic valve regurgitation is trivial. Aorta: The aortic root and ascending aorta are  structurally normal, with no evidence of dilitation. Venous: The inferior vena cava is dilated in size with less than 50% respiratory variability, suggesting right atrial pressure of 15 mmHg. IAS/Shunts: The interatrial septum was not well visualized.  LEFT VENTRICLE PLAX 2D LVIDd:         5.10 cm      Diastology LVIDs:         4.00 cm      LV e' medial:    7.40 cm/s LV PW:         2.00 cm      LV E/e' medial:  22.2 LV IVS:        0.80 cm      LV e' lateral:   7.29 cm/s LVOT diam:     2.40 cm      LV E/e' lateral: 22.5 LV SV:         97 LV SV Index:   51 LVOT Area:     4.52 cm  LV Volumes (MOD) LV vol d, MOD A2C: 135.0 ml LV vol d, MOD A4C: 105.0 ml LV vol s, MOD A2C: 69.9 ml LV vol s, MOD A4C: 59.0 ml LV SV MOD A2C:     65.1 ml LV SV MOD A4C:     105.0 ml LV SV MOD BP:      53.6 ml RIGHT VENTRICLE            IVC RV S prime:     8.33 cm/s  IVC diam: 2.70 cm TAPSE (M-mode): 1.4 cm LEFT ATRIUM            Index        RIGHT ATRIUM           Index LA diam:      5.80 cm  3.03 cm/m   RA Area:     39.60 cm LA Vol (A2C): 72.1 ml  37.62 ml/m  RA Volume:   166.00 ml 86.62 ml/m LA Vol (A4C): 140.0 ml 73.05 ml/m  AORTIC VALVE             PULMONIC VALVE LVOT Vmax:   104.00 cm/s PR End Diast Vel: 2.22 msec LVOT Vmean:  67.300 cm/s LVOT VTI:    0.215 m  AORTA Ao Root diam: 3.60 cm Ao Asc diam:  3.50 cm MITRAL VALVE                TRICUSPID VALVE MV Area (PHT): 3.48 cm     TR Peak grad:   38.2 mmHg MV Area VTI:   2.61 cm     TR Vmax:        309.00 cm/s MV Peak grad:  13.5 mmHg MV Mean grad:  4.0 mmHg     SHUNTS MV Vmax:       1.84 m/s     Systemic VTI:  0.22 m MV Vmean:      88.9 cm/s    Systemic Diam: 2.40 cm MV Decel Time:  218 msec MV E velocity: 164.00 cm/s Oswaldo Milian MD Electronically signed by Oswaldo Milian MD Signature Date/Time: 02/27/2021/6:41:19 PM    Final (Updated)     Assessment & Plan:   Problem List Items Addressed This Visit     Acute on chronic diastolic CHF (congestive heart failure)  ([place])    Edema and DOE f/u - much better on 60 mg Torsemide a day Cont Entresto Check labs - CMET   F/u w/Dr Angelena Form in Jan      Relevant Medications   torsemide (DEMADEX) 20 MG tablet   CRI (chronic renal insufficiency), stage 3 (moderate) (HCC) - Primary    Monitor GFR      Relevant Orders   Comprehensive metabolic panel   Diarrhea   Relevant Orders   Clostridium difficile EIA   PAF (paroxysmal atrial fibrillation) (HCC)    Cont w/Bisoprolol, Coumadin      Relevant Medications   torsemide (DEMADEX) 20 MG tablet   Pain in joint of left knee    F/u w/Ortho S/p steroid shot Norco prn         Meds ordered this encounter  Medications   torsemide (DEMADEX) 20 MG tablet    Sig: Take 3 tablets (60 mg total) by mouth daily.    Dispense:  270 tablet    Refill:  3      Follow-up: Return in about 6 weeks (around 04/02/2022) for a follow-up visit.  Walker Kehr, MD

## 2022-02-19 NOTE — Assessment & Plan Note (Signed)
Monitor GFR 

## 2022-03-05 DIAGNOSIS — M1712 Unilateral primary osteoarthritis, left knee: Secondary | ICD-10-CM | POA: Insufficient documentation

## 2022-03-05 DIAGNOSIS — M179 Osteoarthritis of knee, unspecified: Secondary | ICD-10-CM | POA: Insufficient documentation

## 2022-03-05 DIAGNOSIS — M1711 Unilateral primary osteoarthritis, right knee: Secondary | ICD-10-CM | POA: Insufficient documentation

## 2022-03-12 ENCOUNTER — Other Ambulatory Visit: Payer: Self-pay | Admitting: Internal Medicine

## 2022-03-12 ENCOUNTER — Other Ambulatory Visit: Payer: Self-pay

## 2022-03-12 MED ORDER — BISOPROLOL FUMARATE 5 MG PO TABS: 5.0000 mg | ORAL_TABLET | Freq: Every day | ORAL | 2 refills | Status: DC

## 2022-03-13 ENCOUNTER — Ambulatory Visit: Payer: Medicare Other | Attending: Cardiology | Admitting: *Deleted

## 2022-03-13 DIAGNOSIS — I4891 Unspecified atrial fibrillation: Secondary | ICD-10-CM | POA: Diagnosis not present

## 2022-03-13 DIAGNOSIS — Z7901 Long term (current) use of anticoagulants: Secondary | ICD-10-CM

## 2022-03-13 DIAGNOSIS — I6350 Cerebral infarction due to unspecified occlusion or stenosis of unspecified cerebral artery: Secondary | ICD-10-CM | POA: Diagnosis not present

## 2022-03-13 DIAGNOSIS — I480 Paroxysmal atrial fibrillation: Secondary | ICD-10-CM | POA: Diagnosis not present

## 2022-03-13 DIAGNOSIS — Z9889 Other specified postprocedural states: Secondary | ICD-10-CM | POA: Diagnosis not present

## 2022-03-13 DIAGNOSIS — I059 Rheumatic mitral valve disease, unspecified: Secondary | ICD-10-CM | POA: Diagnosis not present

## 2022-03-13 DIAGNOSIS — G459 Transient cerebral ischemic attack, unspecified: Secondary | ICD-10-CM

## 2022-03-13 LAB — POCT INR: INR: 3.3 — AB (ref 2.0–3.0)

## 2022-03-13 NOTE — Patient Instructions (Signed)
Description   Continue taking 1 tablet daily. Recheck in 5 weeks. Coumadin Clinic [phone]

## 2022-03-14 DIAGNOSIS — M1712 Unilateral primary osteoarthritis, left knee: Secondary | ICD-10-CM | POA: Diagnosis not present

## 2022-03-16 DIAGNOSIS — Z0000 Encounter for general adult medical examination without abnormal findings: Secondary | ICD-10-CM | POA: Diagnosis not present

## 2022-03-20 DIAGNOSIS — M1712 Unilateral primary osteoarthritis, left knee: Secondary | ICD-10-CM | POA: Diagnosis not present

## 2022-03-20 DIAGNOSIS — M1711 Unilateral primary osteoarthritis, right knee: Secondary | ICD-10-CM | POA: Diagnosis not present

## 2022-04-01 NOTE — Progress Notes (Unsigned)
No chief complaint on file.  History of Present Illness: 89 yo male with history of severe mitral regurgitation s/p MVR in 1986, permanent atrial fibrillation, pulmonary HTN, HTN, HLD, DM, CKD stage III, and prior TIA who is here today for cardiac follow up. He has been followed by Dr. Saunders Revel. I saw him for the first time in August 2020. He had MVR in 1986. He has been on coumadin. He has had prior TIA and has been on ASA and dypridamole. Syncope in 2020. Echo in December 2020 with LVEF=55-60%, moderate LVH, MVR with moderate MR. Cardiac monitor in 2020 with atrial fib, occasional PVCs, NSVT. He was admitted in September 2022 for an appendectomy. He developed an abscess and was readmitted in October 2022 for treatment. He then developed C diff colitis and was admitted for several weeks. His norvasc and Lisinopril were stopped due to hypotension. I saw him in December 2022 and he c/o worsened LE edema. We discussed increasing his Lasix to 40 mg BID for several days. He was admitted that night to [facility] with worsened diarrhea and found to be C. Diff positive again. Echo during that visit with LVEF=45-50%.  He was diuresed with IV lasix and started on Entresto. He was admitted to [facility] in December 2022 with C diff colitis. Echo 04/17/21 with LVEF=40% with global hypokinesis. Moderate RV dysfunction with dilated RV. Severe dilation both atria. Normally functioning mitral valve replacement with mild MR. He had worsened LE edema, dyspnea and fatigue in July 2023. He was felt to be volume overloaded when he was seen by Dr. Alain Marion and his Lasix was changed to Torsemide. Chest x-ray 10/25/21 with evidence of pulmonary edema and right pleural effusion. I saw him in the office on 10/30/21 and he reported improvement in his dyspnea and LE edema. Echo 11/08/21 with LVEF=35% with global hypokinesis. Mechanical MV working well. I saw him in September 2023 and he was doing well.   He is here today for follow up. The patient denies  any chest pain, dyspnea, palpitations, lower extremity edema, orthopnea, PND, dizziness, near syncope or syncope.   Primary Care Physician: Cassandria Anger, MD  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   NSVT (nonsustained ventricular tachycardia) (HCC)    Peripheral edema    venous insufficiency   Permanent atrial fibrillation ([place])    Pulmonary hypertension ([place])    Syncope    TIA (transient ischemic attack)    while on coumadin   Type II or unspecified type diabetes mellitus without mention of complication, not stated as uncontrolled    lifestyle mangement   Ventricular ectopy    symptomatic    Past Surgical History:  Procedure Laterality Date   CHOLECYSTECTOMY, LAPAROSCOPIC  2003   IR PATIENT EVAL TECH 0-60 MINS  01/09/2021   IR RADIOLOGIST EVAL & MGMT  01/19/2021   IR RADIOLOGIST EVAL & MGMT  02/02/2021   LAPAROSCOPIC APPENDECTOMY N/A 12/05/2020   Procedure: APPENDECTOMY LAPAROSCOPIC;  Surgeon: Stark Klein, MD;  Location: [facility];  Service: General;  Laterality: N/A;   MITRAL VALVE REPLACEMENT     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    Current Outpatient Medications  Medication Sig Dispense Refill   acetaminophen (TYLENOL)  500 MG tablet Take 500 mg by mouth every 6 (six) hours as needed for fever.     allopurinol (ZYLOPRIM) 100 MG tablet Take 0.5 tablets (50 mg total) by mouth daily. 90 tablet 3   aspirin 81 MG EC tablet Take 81 mg by mouth daily.     bisoprolol (ZEBETA) 5 MG tablet Take 1 tablet (5 mg total) by mouth daily. 90 tablet 2   Cholecalciferol (VITAMIN D3) 50 MCG (2000 UT) capsule TAKE 1 CAPSULE BY MOUTH EVERY DAY 100 capsule 3   dipyridamole (PERSANTINE) 50 MG tablet Take 1 tablet (50 mg total) by mouth 3 (three) times daily. 270  tablet 3   HYDROcodone-acetaminophen (NORCO) 7.5-325 MG tablet Take 0.5-1 tablets by mouth every 6 (six) hours as needed. 60 tablet 0   latanoprost (XALATAN) 0.005 % ophthalmic solution Place 1 drop into both eyes at bedtime.     methylPREDNISolone (MEDROL DOSEPAK) 4 MG TBPK tablet As directed for gout attack 21 tablet 0   pantoprazole (PROTONIX) 40 MG tablet Take 1 tablet by mouth daily.     sacubitril-valsartan (ENTRESTO) 49-51 MG Take 1 tablet by mouth 2 (two) times daily. Please keep upcoming appointment for future refills. Thank you. 60 tablet 2   torsemide (DEMADEX) 20 MG tablet Take 3 tablets (60 mg total) by mouth daily. 270 tablet 3   triamcinolone cream (KENALOG) 0.1 % Apply 1 application topically 4 (four) times daily. On rash 450 g 1   warfarin (COUMADIN) 5 MG tablet Take 1 to 1&1/2 tablets by mouth daily as directed by the Anticoagulation Clinic. 130 tablet 1   No current facility-administered medications for this visit.    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  Tobacco Use   Smoking status: Never   Smokeless tobacco: Never  Substance and Sexual Activity   Alcohol use: No    Alcohol/week: 0.0 standard drinks of alcohol   Drug use: No   Sexual activity: Not on file  Other Topics Concern   Not on file  Social History Narrative   HSG, many management courses Married '58, 2 sons - '61, '62   Social Determinants of Health   Financial Resource Strain: Not on  file  Food Insecurity: No Food Insecurity (05/22/2021)   Hunger Vital Sign    Worried About Running Out of Food in the Last Year: Never true    [place] in the Last Year: Never true  Transportation Needs: No Transportation Needs (05/22/2021)   PRAPARE - Hydrologist (Medical): No    Lack of Transportation (Non-Medical): No  Physical Activity: Not on file  Stress: Not on file  Social Connections: Not on file  Intimate Partner Violence: Not on file    Family History  Problem Relation Age of Onset   Coronary artery disease Father    Diabetes Other    Coronary artery disease  Other    Prostate cancer Neg Hx    Colon cancer Neg Hx     Review of Systems:  As stated in the HPI and otherwise negative.   There were no vitals taken for this visit.  Physical Examination: General: Well developed, well nourished, NAD  HEENT: OP clear, mucus membranes moist  SKIN: warm, dry. No rashes. Neuro: No focal deficits  Musculoskeletal: Muscle strength 5/5 all ext  Psychiatric: Mood and affect normal  Neck: No JVD, no carotid bruits, no thyromegaly, no lymphadenopathy.  Lungs:Clear bilaterally, no wheezes, rhonci, crackles Cardiovascular: Regular rate and rhythm. No murmurs, gallops or rubs. Abdomen:Soft. Bowel sounds present. Non-tender.  Extremities: No lower extremity edema. Pulses are 2 + in the bilateral DP/PT.  EKG:  EKG is *** ordered today. The ekg ordered today demonstrates   Echo 11/08/21:  1. Left ventricular ejection fraction, by estimation, is 30 to 35%. The  left ventricle has moderately decreased function. The left ventricle  demonstrates global hypokinesis. Left ventricular diastolic parameters are  indeterminate.   2. Right ventricular systolic function is mildly reduced. The right  ventricular size is severely enlarged. There is mildly elevated pulmonary  artery systolic pressure. The estimated right ventricular systolic  pressure is 22.2  mmHg.   3. Left atrial size was severely dilated.   4. Right atrial size was severely dilated.   5. Mechanical mitral valve (Medtronic-Hall tilting disc). Mean gradient 3  mmHg, no significant mitral regurgitation noted.   6. The tricuspid valve is abnormal. Tricuspid valve regurgitation is  severe.   7. The aortic valve is tricuspid. Aortic valve regurgitation is trivial.  No aortic stenosis is present.   8. Aortic dilatation noted. There is mild dilatation of the ascending  aorta, measuring 39 mm.   9. The inferior vena cava is dilated in size with <50% respiratory  variability, suggesting right atrial pressure of 15 mmHg.   Recent Labs: 02/13/2022: ALT 9; BUN 42; Creatinine, Ser 1.43; Hemoglobin 11.9; Platelets 123.0; Potassium 3.9; Sodium 135   Lipid Panel    Component Value Date/Time   CHOL 153 05/01/2021 0802   TRIG 73 05/01/2021 0802   HDL 66 05/01/2021 0802   CHOLHDL 2.3 05/01/2021 0802   CHOLHDL 2 04/01/2020 0808   VLDL 19.2 04/01/2020 0808   LDLCALC 73 05/01/2021 0802     Wt Readings from Last 3 Encounters:  02/19/22 72.1 kg  01/08/22 70.9 kg  11/29/21 75.7 kg    Assessment and Plan:   1. Persistent atrial fibrillation: Rate controlled. Continue coumadin and beta blocker.   2. Mitral valve disease: He is s/p mechanical mitral valve replacement. This is working well by echo in August 2023. Continue ASA and coumadin. He will use SBE prophylaxis as needed prior to dental procedures.   3. HTN: BP is well controlled. No changes  4. HLD: Lipids followed in primary care. LDL near goal in January 2023. Continue statin  5. Cardiomyopathy/Chronic systolic CHF: Weight is stable. Will continue Torsemide 40 mg per day with an extra 20 mg as needed for weight gain or LE edema.  Labs/ tests ordered today include:  No orders of the defined types were placed in this encounter.  Disposition:   F/U with me in 6 months  Signed, Lauree Chandler, MD 04/01/2022 3:23 PM     [facility] [address], [place], [place]  [address] Phone: [phone]; Fax: [phone]

## 2022-04-02 ENCOUNTER — Ambulatory Visit: Payer: Medicare Other | Attending: Cardiovascular Disease | Admitting: Cardiovascular Disease

## 2022-04-02 ENCOUNTER — Encounter: Payer: Self-pay | Admitting: Cardiovascular Disease

## 2022-04-02 VITALS — BP 146/72 | HR 66 | Ht 69.0 in | Wt 155.0 lb

## 2022-04-02 DIAGNOSIS — E7800 Pure hypercholesterolemia, unspecified: Secondary | ICD-10-CM | POA: Diagnosis not present

## 2022-04-02 DIAGNOSIS — I4821 Permanent atrial fibrillation: Secondary | ICD-10-CM

## 2022-04-02 DIAGNOSIS — I059 Rheumatic mitral valve disease, unspecified: Secondary | ICD-10-CM | POA: Diagnosis not present

## 2022-04-02 DIAGNOSIS — I10 Essential (primary) hypertension: Secondary | ICD-10-CM | POA: Diagnosis not present

## 2022-04-02 DIAGNOSIS — I5042 Chronic combined systolic (congestive) and diastolic (congestive) heart failure: Secondary | ICD-10-CM

## 2022-04-02 NOTE — Patient Instructions (Signed)
Medication Instructions:  No changes *If you need a refill on your cardiac medications before your next appointment, please call your pharmacy*   Lab Work: none   Testing/Procedures: none   Follow-Up: At [facility], you and your health needs are our priority.  As part of our continuing mission to provide you with exceptional heart care, we have created designated Provider Care Teams.  These Care Teams include your primary Cardiologist (physician) and Advanced Practice Providers (APPs -  Physician Assistants and Nurse Practitioners) who all work together to provide you with the care you need, when you need it.   Your next appointment:   6 month(s)  The format for your next appointment:   In Person  Provider:   Lauree Chandler, MD      Important Information About Sugar

## 2022-04-11 ENCOUNTER — Other Ambulatory Visit: Payer: Self-pay | Admitting: Nurse Practitioner

## 2022-04-11 MED ORDER — ENTRESTO 49-51 MG PO TABS: 1.0000 | ORAL_TABLET | Freq: Two times a day (BID) | ORAL | 11 refills | Status: DC

## 2022-04-12 ENCOUNTER — Ambulatory Visit (INDEPENDENT_AMBULATORY_CARE_PROVIDER_SITE_OTHER): Payer: Medicare Other | Admitting: Internal Medicine

## 2022-04-12 ENCOUNTER — Encounter: Payer: Self-pay | Admitting: Internal Medicine

## 2022-04-12 VITALS — BP 122/68 | HR 66 | Temp 98.3°F | Ht 69.0 in | Wt 153.0 lb

## 2022-04-12 DIAGNOSIS — I5032 Chronic diastolic (congestive) heart failure: Secondary | ICD-10-CM

## 2022-04-12 DIAGNOSIS — I480 Paroxysmal atrial fibrillation: Secondary | ICD-10-CM | POA: Diagnosis not present

## 2022-04-12 DIAGNOSIS — I6350 Cerebral infarction due to unspecified occlusion or stenosis of unspecified cerebral artery: Secondary | ICD-10-CM

## 2022-04-12 NOTE — Progress Notes (Signed)
Subjective:  Patient ID: RAHMAAN BIRKELAND, male    DOB: December 24, 1933  Age: 89 y.o. MRN: [id number]  CC: No chief complaint on file.   HPI KELON CONKIN presents for CHF, edema - better.  She is here with his wife F/u gout, GERD  Outpatient Medications Prior to Visit  Medication Sig Dispense Refill   acetaminophen (TYLENOL) 500 MG tablet Take 500 mg by mouth every 6 (six) hours as needed for fever.     allopurinol (ZYLOPRIM) 100 MG tablet Take 0.5 tablets (50 mg total) by mouth daily. 90 tablet 3   aspirin 81 MG EC tablet Take 81 mg by mouth daily.     bisoprolol (ZEBETA) 5 MG tablet Take 1 tablet (5 mg total) by mouth daily. 90 tablet 2   Cholecalciferol (VITAMIN D3) 50 MCG (2000 UT) capsule TAKE 1 CAPSULE BY MOUTH EVERY DAY 100 capsule 3   dipyridamole (PERSANTINE) 50 MG tablet Take 1 tablet (50 mg total) by mouth 3 (three) times daily. 270 tablet 3   HYDROcodone-acetaminophen (NORCO) 7.5-325 MG tablet Take 0.5-1 tablets by mouth every 6 (six) hours as needed. 60 tablet 0   latanoprost (XALATAN) 0.005 % ophthalmic solution Place 1 drop into both eyes at bedtime.     methylPREDNISolone (MEDROL DOSEPAK) 4 MG TBPK tablet As directed for gout attack 21 tablet 0   pantoprazole (PROTONIX) 40 MG tablet Take 1 tablet by mouth daily.     sacubitril-valsartan (ENTRESTO) 49-51 MG Take 1 tablet by mouth 2 (two) times daily. 60 tablet 11   torsemide (DEMADEX) 20 MG tablet Take 3 tablets (60 mg total) by mouth daily. 270 tablet 3   triamcinolone cream (KENALOG) 0.1 % Apply 1 application topically 4 (four) times daily. On rash 450 g 1   warfarin (COUMADIN) 5 MG tablet Take 1 to 1&1/2 tablets by mouth daily as directed by the Anticoagulation Clinic. 130 tablet 1   No facility-administered medications prior to visit.    ROS: Review of Systems  Constitutional:  Positive for fatigue. Negative for appetite change and unexpected weight change.  HENT:  Negative for congestion, nosebleeds, sneezing,  sore throat and trouble swallowing.   Eyes:  Negative for itching and visual disturbance.  Respiratory:  Negative for cough.   Cardiovascular:  Positive for leg swelling. Negative for chest pain and palpitations.  Gastrointestinal:  Negative for abdominal distention, blood in stool, diarrhea and nausea.  Genitourinary:  Negative for frequency and hematuria.  Musculoskeletal:  Positive for arthralgias and gait problem. Negative for back pain, joint swelling and neck pain.  Skin:  Negative for rash.  Neurological:  Negative for dizziness, tremors, speech difficulty and weakness.  Psychiatric/Behavioral:  Negative for agitation, dysphoric mood, sleep disturbance and suicidal ideas. The patient is not nervous/anxious.     Objective:  BP 122/68 (BP Location: Right Arm, Patient Position: Sitting, Cuff Size: Normal)   Pulse 66   Temp 98.3 F (36.8 C) (Oral)   Ht '5\' 9"'$  (1.753 m)   Wt 153 lb (69.4 kg)   SpO2 98%   BMI 22.59 kg/m   BP Readings from Last 3 Encounters:  04/12/22 122/68  04/02/22 (!) 146/72  02/19/22 122/70    Wt Readings from Last 3 Encounters:  04/12/22 153 lb (69.4 kg)  04/02/22 155 lb (70.3 kg)  02/19/22 159 lb (72.1 kg)    Physical Exam Constitutional:      General: He is not in acute distress.    Appearance: Normal appearance. He is  well-developed.     Comments: NAD  Eyes:     Conjunctiva/sclera: Conjunctivae normal.     Pupils: Pupils are equal, round, and reactive to light.  Neck:     Thyroid: No thyromegaly.     Vascular: No JVD.  Cardiovascular:     Rate and Rhythm: Normal rate and regular rhythm.     Heart sounds: Normal heart sounds. No murmur heard.    No friction rub. No gallop.  Pulmonary:     Effort: Pulmonary effort is normal. No respiratory distress.     Breath sounds: Normal breath sounds. No wheezing or rales.  Chest:     Chest wall: No tenderness.  Abdominal:     General: Bowel sounds are normal. There is no distension.     Palpations:  Abdomen is soft. There is no mass.     Tenderness: There is no abdominal tenderness. There is no guarding or rebound.  Musculoskeletal:        General: No swelling or tenderness. Normal range of motion.     Cervical back: Normal range of motion.     Right lower leg: No edema.     Left lower leg: No edema.  Lymphadenopathy:     Cervical: No cervical adenopathy.  Skin:    General: Skin is warm and dry.     Findings: Bruising present. No rash.  Neurological:     Mental Status: He is alert and oriented to person, place, and time. Mental status is at baseline.     Cranial Nerves: No cranial nerve deficit.     Motor: No abnormal muscle tone.     Coordination: Coordination normal.     Gait: Gait abnormal.     Deep Tendon Reflexes: Reflexes are normal and symmetric.  Psychiatric:        Behavior: Behavior normal.        Thought Content: Thought content normal.        Judgment: Judgment normal.    Using a cane L knee w/pain   Lab Results  Component Value Date   WBC 3.9 (L) 02/13/2022   HGB 11.9 (L) 02/13/2022   HCT 35.9 (L) 02/13/2022   PLT 123.0 (L) 02/13/2022   GLUCOSE 90 02/13/2022   CHOL 153 05/01/2021   TRIG 73 05/01/2021   HDL 66 05/01/2021   LDLCALC 73 05/01/2021   ALT 9 02/13/2022   AST 24 02/13/2022   NA 135 02/13/2022   K 3.9 02/13/2022   CL 100 02/13/2022   CREATININE 1.43 02/13/2022   BUN 42 (H) 02/13/2022   CO2 27 02/13/2022   TSH 6.199 (H) 02/04/2021   PSA 0.87 10/20/2014   INR 1.7 (A) 05/24/2022   HGBA1C 4.9 02/04/2021    ECHOCARDIOGRAM COMPLETE  Result Date: 02/27/2021    ECHOCARDIOGRAM REPORT   Patient Name:   MARLO BOSSERT Date of Exam: 02/27/2021 Medical Rec #:  [id number]         Height:       69.0 in Accession #:    [id number]        Weight:       167.6 lb Date of Birth:  [birth year]         BSA:          1.917 m Patient Age:    16 years          BP:           124/56 mmHg Patient Gender: M  HR:           69 bpm. Exam Location:   Inpatient Procedure: 2D Echo, 3D Echo, Cardiac Doppler and Color Doppler Indications:     I50.40* Unspecified combined systolic (congestive) and                  diastolic (congestive) heart failure  History:         Patient has prior history of Echocardiogram examinations, most                  recent 03/09/2019. CHF, Abnormal ECG, Pulmonary HTN and TIA,                  Mitral Valve Disease, Arrythmias:Atrial Fibrillation and NSVT,                  Signs/Symptoms:Chest Pain and Syncope; Risk                  Factors:Hypertension and Diabetes. Mechanical mitral valve.                   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 Procedure Date: 64.  Sonographer:     Roseanna Rainbow RDCS Referring Phys:  [id number] [place] Diagnosing Phys: Oswaldo Milian MD  Sonographer Comments: Technically difficult study due to poor echo windows. IMPRESSIONS  1. Left ventricular ejection fraction, by estimation, is 40 to 45%. The left ventricle has mildly decreased function. The left ventricle demonstrates regional wall motion abnormalities (see scoring diagram/findings for description). Septal hypokinesis. There is moderate left ventricular hypertrophy. Left ventricular diastolic parameters are indeterminate.  2. Right ventricular systolic function is moderately reduced. The right ventricular size is moderately enlarged. There is moderately elevated pulmonary artery systolic pressure. The estimated right ventricular systolic pressure is [id number] mmHg.  3. Left atrial size was severely dilated.  4. Right atrial size was severely dilated.  5. 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 Procedure Date: 28. Trivial mitral valve regurgitation.     MG 60mHg at 63bpm, EOA 2.4 cm^2  6. The tricuspid valve is abnormal. Tricuspid valve regurgitation is severe.  7. The aortic valve is tricuspid. Aortic valve regurgitation is trivial.  Aortic valve sclerosis is present, with no evidence of aortic valve stenosis.  8. The inferior vena cava is dilated in size with <50% respiratory variability, suggesting right atrial pressure of 15 mmHg. FINDINGS  Left Ventricle: Left ventricular ejection fraction, by estimation, is 40 to 45%. The left ventricle has mildly decreased function. The left ventricle demonstrates regional wall motion abnormalities. The left ventricular internal cavity size was normal in size. There is moderate left ventricular hypertrophy. Left ventricular diastolic parameters are indeterminate.  LV Wall Scoring: The entire septum is hypokinetic. The entire anterior wall, entire lateral wall, entire inferior wall, and apex are normal. Right Ventricle: The right ventricular size is moderately enlarged. Right vetricular wall thickness was not well visualized. Right ventricular systolic function is moderately reduced. There is moderately elevated pulmonary artery systolic pressure. The tricuspid regurgitant velocity is 3.09 m/s, and with an assumed right atrial pressure of 15 mmHg, the estimated right ventricular systolic pressure is [id number]. Left Atrium: Left atrial size was severely dilated. Right Atrium: Right atrial size was severely dilated. Pericardium: There is no evidence of pericardial effusion. Mitral  Valve: The mitral valve has been repaired/replaced. Trivial mitral valve regurgitation. There is a unknown size Medtronic tilting disc valve present in the mitral position. Procedure Date: 36. MV peak gradient, 13.5 mmHg. The mean mitral valve gradient is 4.0 mmHg. Tricuspid Valve: The tricuspid valve is abnormal. Tricuspid valve regurgitation is severe. Aortic Valve: The aortic valve is tricuspid. Aortic valve regurgitation is trivial. Aortic valve sclerosis is present, with no evidence of aortic valve stenosis. Pulmonic Valve: The pulmonic valve was not well visualized. Pulmonic valve regurgitation is trivial. Aorta: The aortic  root and ascending aorta are structurally normal, with no evidence of dilitation. Venous: The inferior vena cava is dilated in size with less than 50% respiratory variability, suggesting right atrial pressure of 15 mmHg. IAS/Shunts: The interatrial septum was not well visualized.  LEFT VENTRICLE PLAX 2D LVIDd:         5.10 cm      Diastology LVIDs:         4.00 cm      LV e' medial:    7.40 cm/s LV PW:         2.00 cm      LV E/e' medial:  22.2 LV IVS:        0.80 cm      LV e' lateral:   7.29 cm/s LVOT diam:     2.40 cm      LV E/e' lateral: 22.5 LV SV:         97 LV SV Index:   51 LVOT Area:     4.52 cm  LV Volumes (MOD) LV vol d, MOD A2C: 135.0 ml LV vol d, MOD A4C: 105.0 ml LV vol s, MOD A2C: 69.9 ml LV vol s, MOD A4C: 59.0 ml LV SV MOD A2C:     65.1 ml LV SV MOD A4C:     105.0 ml LV SV MOD BP:      53.6 ml RIGHT VENTRICLE            IVC RV S prime:     8.33 cm/s  IVC diam: 2.70 cm TAPSE (M-mode): 1.4 cm LEFT ATRIUM            Index        RIGHT ATRIUM           Index LA diam:      5.80 cm  3.03 cm/m   RA Area:     39.60 cm LA Vol (A2C): 72.1 ml  37.62 ml/m  RA Volume:   166.00 ml 86.62 ml/m LA Vol (A4C): 140.0 ml 73.05 ml/m  AORTIC VALVE             PULMONIC VALVE LVOT Vmax:   104.00 cm/s PR End Diast Vel: 2.22 msec LVOT Vmean:  67.300 cm/s LVOT VTI:    0.215 m  AORTA Ao Root diam: 3.60 cm Ao Asc diam:  3.50 cm MITRAL VALVE                TRICUSPID VALVE MV Area (PHT): 3.48 cm     TR Peak grad:   38.2 mmHg MV Area VTI:   2.61 cm     TR Vmax:        309.00 cm/s MV Peak grad:  13.5 mmHg MV Mean grad:  4.0 mmHg     SHUNTS MV Vmax:       1.84 m/s     Systemic VTI:  0.22 m MV Vmean:      88.9  cm/s    Systemic Diam: 2.40 cm MV Decel Time: 218 msec MV E velocity: 164.00 cm/s Oswaldo Milian MD Electronically signed by Oswaldo Milian MD Signature Date/Time: 02/27/2021/6:41:19 PM    Final (Updated)     Assessment & Plan:   Problem List Items Addressed This Visit       Cardiovascular and  Mediastinum   PAF (paroxysmal atrial fibrillation) ([place])     Cont w/Bisoprolol, Coumadin      Relevant Orders   Comprehensive metabolic panel   CBC with Differential/Platelet   Chronic diastolic heart failure ([place]) - Primary    Cont on Demadex 40-60 mg/d; Entresto      Relevant Orders   Comprehensive metabolic panel   CBC with Differential/Platelet   Cerebral artery occlusion with cerebral infarction ([place])    On Diltiazem, Bisoprolol, Enalapril, Furosemide, Coumadin, ASA Cont w/Bisoprolol, Coumadin         No orders of the defined types were placed in this encounter.     Follow-up: Return in about 2 months (around 06/11/2022) for a follow-up visit.  Walker Kehr, MD

## 2022-04-12 NOTE — Patient Instructions (Signed)
For a mild COVID-19 case - take zinc 50 mg a day for 1 week, vitamin C 1000 mg daily for 1 week, vitamin D2 50,000 units weekly for 2 months (unless  taking vitamin D daily already), an antioxidant Quercetin 500 mg twice a day for 1 week (if you can get it quick enough). Take Allegra or Benadryl.  Maintain good oral hydration and take Tylenol for high fever.  Call if problems. Isolate for 5 days, then wear a mask for 5 days per CDC.

## 2022-04-12 NOTE — Assessment & Plan Note (Addendum)
  Cont w/Bisoprolol, Coumadin

## 2022-04-12 NOTE — Assessment & Plan Note (Addendum)
Cont on Demadex 40-60 mg/d; Delene Loll

## 2022-04-12 NOTE — Assessment & Plan Note (Signed)
On Diltiazem, Bisoprolol, Enalapril, Furosemide, Coumadin, ASA Cont w/Bisoprolol, Coumadin

## 2022-04-17 ENCOUNTER — Ambulatory Visit: Payer: Medicare Other | Attending: Cardiology

## 2022-04-17 DIAGNOSIS — I059 Rheumatic mitral valve disease, unspecified: Secondary | ICD-10-CM

## 2022-04-17 DIAGNOSIS — Z7901 Long term (current) use of anticoagulants: Secondary | ICD-10-CM

## 2022-04-17 DIAGNOSIS — G459 Transient cerebral ischemic attack, unspecified: Secondary | ICD-10-CM | POA: Diagnosis not present

## 2022-04-17 DIAGNOSIS — I480 Paroxysmal atrial fibrillation: Secondary | ICD-10-CM | POA: Diagnosis not present

## 2022-04-17 DIAGNOSIS — Z9889 Other specified postprocedural states: Secondary | ICD-10-CM | POA: Diagnosis not present

## 2022-04-17 DIAGNOSIS — I6350 Cerebral infarction due to unspecified occlusion or stenosis of unspecified cerebral artery: Secondary | ICD-10-CM | POA: Diagnosis not present

## 2022-04-17 LAB — POCT INR: INR: 2.6 (ref 2.0–3.0)

## 2022-04-17 NOTE — Patient Instructions (Signed)
Description   Take an extra 1/2 tablet today, then resume same dosage of Warfarin 1 tablet daily. Recheck in 3 weeks. [facility] [phone]

## 2022-05-03 DIAGNOSIS — M1712 Unilateral primary osteoarthritis, left knee: Secondary | ICD-10-CM | POA: Diagnosis not present

## 2022-05-03 DIAGNOSIS — M1612 Unilateral primary osteoarthritis, left hip: Secondary | ICD-10-CM | POA: Diagnosis not present

## 2022-05-08 DIAGNOSIS — H26493 Other secondary cataract, bilateral: Secondary | ICD-10-CM | POA: Diagnosis not present

## 2022-05-08 DIAGNOSIS — Z961 Presence of intraocular lens: Secondary | ICD-10-CM | POA: Diagnosis not present

## 2022-05-08 DIAGNOSIS — H18413 Arcus senilis, bilateral: Secondary | ICD-10-CM | POA: Diagnosis not present

## 2022-05-08 DIAGNOSIS — H401131 Primary open-angle glaucoma, bilateral, mild stage: Secondary | ICD-10-CM | POA: Diagnosis not present

## 2022-05-08 DIAGNOSIS — H26491 Other secondary cataract, right eye: Secondary | ICD-10-CM | POA: Diagnosis not present

## 2022-05-09 DIAGNOSIS — M25552 Pain in left hip: Secondary | ICD-10-CM | POA: Diagnosis not present

## 2022-05-11 ENCOUNTER — Ambulatory Visit: Payer: Medicare Other | Attending: Cardiovascular Disease | Admitting: Pharmacist

## 2022-05-11 DIAGNOSIS — G459 Transient cerebral ischemic attack, unspecified: Secondary | ICD-10-CM | POA: Diagnosis not present

## 2022-05-11 DIAGNOSIS — I6350 Cerebral infarction due to unspecified occlusion or stenosis of unspecified cerebral artery: Secondary | ICD-10-CM

## 2022-05-11 DIAGNOSIS — Z7901 Long term (current) use of anticoagulants: Secondary | ICD-10-CM

## 2022-05-11 DIAGNOSIS — I480 Paroxysmal atrial fibrillation: Secondary | ICD-10-CM | POA: Diagnosis not present

## 2022-05-11 DIAGNOSIS — Z9889 Other specified postprocedural states: Secondary | ICD-10-CM

## 2022-05-11 DIAGNOSIS — I059 Rheumatic mitral valve disease, unspecified: Secondary | ICD-10-CM | POA: Diagnosis not present

## 2022-05-11 LAB — POCT INR: INR: 2.1 (ref 2.0–3.0)

## 2022-05-11 NOTE — Patient Instructions (Signed)
Description   Take an extra 75m tablet today and take an extra 1/2 tablet tomorrow, then resume same dosage of Warfarin 1 tablet daily. Recheck in 2 weeks. [facility] [phone]

## 2022-05-16 DIAGNOSIS — H35033 Hypertensive retinopathy, bilateral: Secondary | ICD-10-CM | POA: Diagnosis not present

## 2022-05-16 DIAGNOSIS — H40053 Ocular hypertension, bilateral: Secondary | ICD-10-CM | POA: Diagnosis not present

## 2022-05-16 DIAGNOSIS — H53432 Sector or arcuate defects, left eye: Secondary | ICD-10-CM | POA: Diagnosis not present

## 2022-05-16 DIAGNOSIS — H26493 Other secondary cataract, bilateral: Secondary | ICD-10-CM | POA: Diagnosis not present

## 2022-05-24 ENCOUNTER — Ambulatory Visit: Payer: Medicare Other | Attending: Cardiovascular Disease | Admitting: *Deleted

## 2022-05-24 DIAGNOSIS — I480 Paroxysmal atrial fibrillation: Secondary | ICD-10-CM | POA: Diagnosis not present

## 2022-05-24 DIAGNOSIS — Z9889 Other specified postprocedural states: Secondary | ICD-10-CM | POA: Diagnosis not present

## 2022-05-24 DIAGNOSIS — Z7901 Long term (current) use of anticoagulants: Secondary | ICD-10-CM

## 2022-05-24 DIAGNOSIS — G459 Transient cerebral ischemic attack, unspecified: Secondary | ICD-10-CM | POA: Diagnosis not present

## 2022-05-24 DIAGNOSIS — I059 Rheumatic mitral valve disease, unspecified: Secondary | ICD-10-CM

## 2022-05-24 DIAGNOSIS — I6350 Cerebral infarction due to unspecified occlusion or stenosis of unspecified cerebral artery: Secondary | ICD-10-CM | POA: Diagnosis not present

## 2022-05-24 LAB — POCT INR: INR: 1.7 — AB (ref 2.0–3.0)

## 2022-05-24 NOTE — Patient Instructions (Signed)
Description   Take an extra '5mg'$  tablet of warfarin today and tomorrow take '10mg'$  of warfarin, then START Warfarin 1 tablet daily except 1.5 tablets on Mondays. Recheck in 2 weeks. [facility] [phone]

## 2022-06-03 ENCOUNTER — Encounter: Payer: Self-pay | Admitting: Internal Medicine

## 2022-06-04 ENCOUNTER — Other Ambulatory Visit (INDEPENDENT_AMBULATORY_CARE_PROVIDER_SITE_OTHER): Payer: Medicare Other

## 2022-06-04 DIAGNOSIS — I5032 Chronic diastolic (congestive) heart failure: Secondary | ICD-10-CM

## 2022-06-04 DIAGNOSIS — I480 Paroxysmal atrial fibrillation: Secondary | ICD-10-CM | POA: Diagnosis not present

## 2022-06-04 LAB — COMPREHENSIVE METABOLIC PANEL
ALT: 15 U/L (ref 0–53)
AST: 24 U/L (ref 0–37)
Albumin: 3.5 g/dL (ref 3.5–5.2)
Alkaline Phosphatase: 134 U/L — ABNORMAL HIGH (ref 39–117)
BUN: 27 mg/dL — ABNORMAL HIGH (ref 6–23)
CO2: 26 mEq/L (ref 19–32)
Calcium: 9.2 mg/dL (ref 8.4–10.5)
Chloride: 98 mEq/L (ref 96–112)
Creatinine, Ser: 1.29 mg/dL (ref 0.40–1.50)
GFR: 50.06 mL/min — ABNORMAL LOW (ref >60.00)
Glucose, Bld: 85 mg/dL (ref 70–99)
Potassium: 4.9 mEq/L (ref 3.5–5.1)
Sodium: 130 mEq/L — ABNORMAL LOW (ref 135–145)
Total Bilirubin: 0.7 mg/dL (ref 0.2–1.2)
Total Protein: 6.7 g/dL (ref 6.0–8.3)

## 2022-06-04 LAB — CBC WITH DIFFERENTIAL/PLATELET
Basophils Absolute: 0.1 10*3/uL (ref 0.0–0.1)
Basophils Relative: 1.2 % (ref 0.0–3.0)
Eosinophils Absolute: 0.8 10*3/uL — ABNORMAL HIGH (ref 0.0–0.7)
Eosinophils Relative: 17.7 % — ABNORMAL HIGH (ref 0.0–5.0)
HCT: 35.8 % — ABNORMAL LOW (ref 39.0–52.0)
Hemoglobin: 12.2 g/dL — ABNORMAL LOW (ref 13.0–17.0)
Lymphocytes Relative: 22.5 % (ref 12.0–46.0)
Lymphs Abs: 1 10*3/uL (ref 0.7–4.0)
MCHC: 34.1 g/dL (ref 30.0–36.0)
MCV: 96.6 fl (ref 78.0–100.0)
Monocytes Absolute: 0.3 10*3/uL (ref 0.1–1.0)
Monocytes Relative: 7.6 % (ref 3.0–12.0)
Neutro Abs: 2.2 10*3/uL (ref 1.4–7.7)
Neutrophils Relative %: 51 % (ref 43.0–77.0)
Platelets: 202 10*3/uL (ref 150.0–400.0)
RBC: 3.71 Mil/uL — ABNORMAL LOW (ref 4.22–5.81)
RDW: 14.9 % (ref 11.5–15.5)
WBC: 4.3 10*3/uL (ref 4.0–10.5)

## 2022-06-07 ENCOUNTER — Ambulatory Visit: Payer: Medicare Other

## 2022-06-11 ENCOUNTER — Encounter: Payer: Self-pay | Admitting: Internal Medicine

## 2022-06-11 ENCOUNTER — Ambulatory Visit (INDEPENDENT_AMBULATORY_CARE_PROVIDER_SITE_OTHER): Payer: Medicare Other | Admitting: Internal Medicine

## 2022-06-11 VITALS — BP 124/60 | HR 42 | Temp 97.9°F | Ht 69.0 in | Wt 154.0 lb

## 2022-06-11 DIAGNOSIS — N183 Chronic kidney disease, stage 3 unspecified: Secondary | ICD-10-CM

## 2022-06-11 DIAGNOSIS — I10 Essential (primary) hypertension: Secondary | ICD-10-CM

## 2022-06-11 DIAGNOSIS — E1159 Type 2 diabetes mellitus with other circulatory complications: Secondary | ICD-10-CM

## 2022-06-11 DIAGNOSIS — M542 Cervicalgia: Secondary | ICD-10-CM

## 2022-06-11 DIAGNOSIS — I5082 Biventricular heart failure: Secondary | ICD-10-CM

## 2022-06-11 NOTE — Progress Notes (Addendum)
Subjective:  Patient ID: MONTREY EKLEBERRY, male    DOB: 26-Aug-1933  Age: 89 y.o. MRN: [id number]  CC: Follow-up (2 MNTH F/U)   HPI [name] presents for neck pain - [facility] x 2 wks Using Asper cream F/u CHF, abn labs  Outpatient Medications Prior to Visit  Medication Sig Dispense Refill   acetaminophen (TYLENOL) 500 MG tablet Take 500 mg by mouth every 6 (six) hours as needed for fever.     allopurinol (ZYLOPRIM) 100 MG tablet Take 0.5 tablets (50 mg total) by mouth daily. 90 tablet 3   aspirin 81 MG EC tablet Take 81 mg by mouth daily.     bisoprolol (ZEBETA) 5 MG tablet Take 1 tablet (5 mg total) by mouth daily. 90 tablet 2   Cholecalciferol (VITAMIN D3) 50 MCG (2000 UT) capsule TAKE 1 CAPSULE BY MOUTH EVERY DAY 100 capsule 3   dipyridamole (PERSANTINE) 50 MG tablet Take 1 tablet (50 mg total) by mouth 3 (three) times daily. 270 tablet 3   HYDROcodone-acetaminophen (NORCO) 7.5-325 MG tablet Take 0.5-1 tablets by mouth every 6 (six) hours as needed. 60 tablet 0   latanoprost (XALATAN) 0.005 % ophthalmic solution Place 1 drop into both eyes at bedtime.     methylPREDNISolone (MEDROL DOSEPAK) 4 MG TBPK tablet As directed for gout attack 21 tablet 0   pantoprazole (PROTONIX) 40 MG tablet Take 1 tablet by mouth daily.     sacubitril-valsartan (ENTRESTO) 49-51 MG Take 1 tablet by mouth 2 (two) times daily. 60 tablet 11   torsemide (DEMADEX) 20 MG tablet Take 3 tablets (60 mg total) by mouth daily. 270 tablet 3   warfarin (COUMADIN) 5 MG tablet Take 1 to 1&1/2 tablets by mouth daily as directed by the Anticoagulation Clinic. 130 tablet 1   No facility-administered medications prior to visit.    ROS: Review of Systems  Constitutional:  Negative for appetite change, fatigue and unexpected weight change.  HENT:  Negative for congestion, nosebleeds, sneezing, sore throat and trouble swallowing.   Eyes:  Negative for itching and visual disturbance.  Respiratory:  Negative for cough.    Cardiovascular:  Negative for chest pain, palpitations and leg swelling.  Gastrointestinal:  Negative for abdominal distention, blood in stool, diarrhea and nausea.  Genitourinary:  Negative for frequency and hematuria.  Musculoskeletal:  Positive for neck pain and neck stiffness. Negative for back pain, gait problem and joint swelling.  Skin:  Negative for rash.  Neurological:  Negative for dizziness, tremors, speech difficulty and weakness.  Psychiatric/Behavioral:  Negative for agitation, dysphoric mood and sleep disturbance. The patient is not nervous/anxious.     Objective:  BP 124/60 (BP Location: Left Arm, Patient Position: Sitting, Cuff Size: Large)   Pulse (!) 42   Temp 97.9 F (36.6 C) (Oral)   Ht 5\' 9"  (1.753 m)   Wt 154 lb (69.9 kg)   SpO2 98%   BMI 22.74 kg/m   BP Readings from Last 3 Encounters:  06/11/22 124/60  04/12/22 122/68  04/02/22 (!) 146/72    Wt Readings from Last 3 Encounters:  06/11/22 154 lb (69.9 kg)  04/12/22 153 lb (69.4 kg)  04/02/22 155 lb (70.3 kg)    Physical Exam Constitutional:      General: He is not in acute distress.    Appearance: He is well-developed.     Comments: NAD  Eyes:     Conjunctiva/sclera: Conjunctivae normal.     Pupils: Pupils are equal, round, and reactive  to light.  Neck:     Thyroid: No thyromegaly.     Vascular: No JVD.  Cardiovascular:     Rate and Rhythm: Normal rate and regular rhythm.     Heart sounds: Normal heart sounds. No murmur heard.    No friction rub. No gallop.  Pulmonary:     Effort: Pulmonary effort is normal. No respiratory distress.     Breath sounds: Normal breath sounds. No wheezing or rales.  Chest:     Chest wall: No tenderness.  Abdominal:     General: Bowel sounds are normal. There is no distension.     Palpations: Abdomen is soft. There is no mass.     Tenderness: There is no abdominal tenderness. There is no guarding or rebound.  Musculoskeletal:        General: No tenderness.  Normal range of motion.     Cervical back: Normal range of motion.  Lymphadenopathy:     Cervical: No cervical adenopathy.  Skin:    General: Skin is warm and dry.     Findings: No rash.  Neurological:     Mental Status: He is alert and oriented to person, place, and time.     Cranial Nerves: No cranial nerve deficit.     Motor: No abnormal muscle tone.     Coordination: Coordination normal.     Gait: Gait normal.     Deep Tendon Reflexes: Reflexes are normal and symmetric.  Psychiatric:        Behavior: Behavior normal.        Thought Content: Thought content normal.        Judgment: Judgment normal.     Lab Results  Component Value Date   WBC 4.3 06/04/2022   HGB 12.2 (L) 06/04/2022   HCT 35.8 (L) 06/04/2022   PLT 202.0 06/04/2022   GLUCOSE 85 06/04/2022   CHOL 153 05/01/2021   TRIG 73 05/01/2021   HDL 66 05/01/2021   LDLCALC 73 05/01/2021   ALT 15 06/04/2022   AST 24 06/04/2022   NA 130 (L) 06/04/2022   K 4.9 06/04/2022   CL 98 06/04/2022   CREATININE 1.29 06/04/2022   BUN 27 (H) 06/04/2022   CO2 26 06/04/2022   TSH 6.199 (H) 02/04/2021   PSA 0.87 10/20/2014   INR 1.7 (A) 05/24/2022   HGBA1C 4.9 02/04/2021    ECHOCARDIOGRAM COMPLETE  Result Date: 02/27/2021    ECHOCARDIOGRAM REPORT   Patient Name:   ZAELEN HABERLAND Date of Exam: 02/27/2021 Medical Rec #:  [id number]         Height:       69.0 in Accession #:    [id number]        Weight:       167.6 lb Date of Birth:  [birth year]         BSA:          1.917 m Patient Age:    80 years          BP:           124/56 mmHg Patient Gender: M                 HR:           69 bpm. Exam Location:  Inpatient Procedure: 2D Echo, 3D Echo, Cardiac Doppler and Color Doppler Indications:     I50.40* Unspecified combined systolic (congestive) and  diastolic (congestive) heart failure  History:         Patient has prior history of Echocardiogram examinations, most                  recent 03/09/2019. CHF, Abnormal ECG,  Pulmonary HTN and TIA,                  Mitral Valve Disease, Arrythmias:Atrial Fibrillation and NSVT,                  Signs/Symptoms:Chest Pain and Syncope; Risk                  Factors:Hypertension and Diabetes. Mechanical mitral valve.                   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 Procedure Date: 61.  Sonographer:     Roseanna Rainbow RDCS Referring Phys:  [id number] [place] Diagnosing Phys: Oswaldo Milian MD  Sonographer Comments: Technically difficult study due to poor echo windows. IMPRESSIONS  1. Left ventricular ejection fraction, by estimation, is 40 to 45%. The left ventricle has mildly decreased function. The left ventricle demonstrates regional wall motion abnormalities (see scoring diagram/findings for description). Septal hypokinesis. There is moderate left ventricular hypertrophy. Left ventricular diastolic parameters are indeterminate.  2. Right ventricular systolic function is moderately reduced. The right ventricular size is moderately enlarged. There is moderately elevated pulmonary artery systolic pressure. The estimated right ventricular systolic pressure is [id number] mmHg.  3. Left atrial size was severely dilated.  4. Right atrial size was severely dilated.  5. 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 Procedure Date: 34. Trivial mitral valve regurgitation.     MG 66mmHg at 63bpm, EOA 2.4 cm^2  6. The tricuspid valve is abnormal. Tricuspid valve regurgitation is severe.  7. The aortic valve is tricuspid. Aortic valve regurgitation is trivial. Aortic valve sclerosis is present, with no evidence of aortic valve stenosis.  8. The inferior vena cava is dilated in size with <50% respiratory variability, suggesting right atrial pressure of 15 mmHg. FINDINGS  Left Ventricle: Left ventricular ejection fraction, by estimation, is 40 to 45%. The left ventricle has mildly decreased  function. The left ventricle demonstrates regional wall motion abnormalities. The left ventricular internal cavity size was normal in size. There is moderate left ventricular hypertrophy. Left ventricular diastolic parameters are indeterminate.  LV Wall Scoring: The entire septum is hypokinetic. The entire anterior wall, entire lateral wall, entire inferior wall, and apex are normal. Right Ventricle: The right ventricular size is moderately enlarged. Right vetricular wall thickness was not well visualized. Right ventricular systolic function is moderately reduced. There is moderately elevated pulmonary artery systolic pressure. The tricuspid regurgitant velocity is 3.09 m/s, and with an assumed right atrial pressure of 15 mmHg, the estimated right ventricular systolic pressure is [id number] mmHg. Left Atrium: Left atrial size was severely dilated. Right Atrium: Right atrial size was severely dilated. Pericardium: There is no evidence of pericardial effusion. Mitral Valve: The mitral valve has been repaired/replaced. Trivial mitral valve regurgitation. There is a unknown size Medtronic tilting disc valve present in the mitral position. Procedure Date: 84. MV peak gradient, 13.5 mmHg. The mean mitral valve gradient is 4.0 mmHg. Tricuspid Valve: The tricuspid valve is abnormal. Tricuspid valve regurgitation is severe. Aortic Valve:  The aortic valve is tricuspid. Aortic valve regurgitation is trivial. Aortic valve sclerosis is present, with no evidence of aortic valve stenosis. Pulmonic Valve: The pulmonic valve was not well visualized. Pulmonic valve regurgitation is trivial. Aorta: The aortic root and ascending aorta are structurally normal, with no evidence of dilitation. Venous: The inferior vena cava is dilated in size with less than 50% respiratory variability, suggesting right atrial pressure of 15 mmHg. IAS/Shunts: The interatrial septum was not well visualized.  LEFT VENTRICLE PLAX 2D LVIDd:         5.10 cm       Diastology LVIDs:         4.00 cm      LV e' medial:    7.40 cm/s LV PW:         2.00 cm      LV E/e' medial:  22.2 LV IVS:        0.80 cm      LV e' lateral:   7.29 cm/s LVOT diam:     2.40 cm      LV E/e' lateral: 22.5 LV SV:         97 LV SV Index:   51 LVOT Area:     4.52 cm  LV Volumes (MOD) LV vol d, MOD A2C: 135.0 ml LV vol d, MOD A4C: 105.0 ml LV vol s, MOD A2C: 69.9 ml LV vol s, MOD A4C: 59.0 ml LV SV MOD A2C:     65.1 ml LV SV MOD A4C:     105.0 ml LV SV MOD BP:      53.6 ml RIGHT VENTRICLE            IVC RV S prime:     8.33 cm/s  IVC diam: 2.70 cm TAPSE (M-mode): 1.4 cm LEFT ATRIUM            Index        RIGHT ATRIUM           Index LA diam:      5.80 cm  3.03 cm/m   RA Area:     39.60 cm LA Vol (A2C): 72.1 ml  37.62 ml/m  RA Volume:   166.00 ml 86.62 ml/m LA Vol (A4C): 140.0 ml 73.05 ml/m  AORTIC VALVE             PULMONIC VALVE LVOT Vmax:   104.00 cm/s PR End Diast Vel: 2.22 msec LVOT Vmean:  67.300 cm/s LVOT VTI:    0.215 m  AORTA Ao Root diam: 3.60 cm Ao Asc diam:  3.50 cm MITRAL VALVE                TRICUSPID VALVE MV Area (PHT): 3.48 cm     TR Peak grad:   38.2 mmHg MV Area VTI:   2.61 cm     TR Vmax:        309.00 cm/s MV Peak grad:  13.5 mmHg MV Mean grad:  4.0 mmHg     SHUNTS MV Vmax:       1.84 m/s     Systemic VTI:  0.22 m MV Vmean:      88.9 cm/s    Systemic Diam: 2.40 cm MV Decel Time: 218 msec MV E velocity: 164.00 cm/s Oswaldo Milian MD Electronically signed by Oswaldo Milian MD Signature Date/Time: 02/27/2021/6:41:19 PM    Final (Updated)     Assessment & Plan:   Problem List Items Addressed This Visit  Cardiovascular and Mediastinum   Essential hypertension - Primary   Biventricular heart failure (HCC)    Reduced Torsemide to 10 mg. Cont w/Entresto         Endocrine   DM2 (diabetes mellitus, type 2) (HCC)   Relevant Orders   Hemoglobin A1c   Comprehensive metabolic panel     Genitourinary   CRI (chronic renal insufficiency), stage 3  (moderate) (HCC)    Monitoring GFR        Other   Neck pain    On Asper cream Reolved         No orders of the defined types were placed in this encounter.     Follow-up: Return in about 6 weeks (around 07/23/2022) for a follow-up visit.  Walker Kehr, MD

## 2022-06-11 NOTE — Assessment & Plan Note (Signed)
Monitoring GFR 

## 2022-06-11 NOTE — Assessment & Plan Note (Signed)
Reduced Torsemide to 10 mg. Cont w/Entresto

## 2022-06-11 NOTE — Assessment & Plan Note (Addendum)
On Asper cream Reolved

## 2022-06-11 NOTE — Addendum Note (Signed)
Addended by: Cassandria Anger on: 06/11/2022 03:14 PM   Modules accepted: Level of Service

## 2022-06-12 ENCOUNTER — Ambulatory Visit: Payer: Medicare Other | Attending: Cardiovascular Disease | Admitting: *Deleted

## 2022-06-12 DIAGNOSIS — I059 Rheumatic mitral valve disease, unspecified: Secondary | ICD-10-CM

## 2022-06-12 DIAGNOSIS — Z9889 Other specified postprocedural states: Secondary | ICD-10-CM

## 2022-06-12 DIAGNOSIS — I6350 Cerebral infarction due to unspecified occlusion or stenosis of unspecified cerebral artery: Secondary | ICD-10-CM

## 2022-06-12 DIAGNOSIS — G459 Transient cerebral ischemic attack, unspecified: Secondary | ICD-10-CM | POA: Diagnosis not present

## 2022-06-12 DIAGNOSIS — I480 Paroxysmal atrial fibrillation: Secondary | ICD-10-CM

## 2022-06-12 DIAGNOSIS — Z7901 Long term (current) use of anticoagulants: Secondary | ICD-10-CM

## 2022-06-12 LAB — POCT INR: INR: 1.9 — AB (ref 2.0–3.0)

## 2022-06-12 NOTE — Patient Instructions (Addendum)
Description   Take an extra 5mg  tablet of warfarin today then START Warfarin 1 tablet daily except 1.5 tablets on Mondays, Wednesday, and Friday. Recheck in 2 weeks. [facility] [phone] or [phone]

## 2022-06-19 DIAGNOSIS — H02831 Dermatochalasis of right upper eyelid: Secondary | ICD-10-CM | POA: Diagnosis not present

## 2022-06-19 DIAGNOSIS — Z961 Presence of intraocular lens: Secondary | ICD-10-CM | POA: Diagnosis not present

## 2022-06-19 DIAGNOSIS — H26492 Other secondary cataract, left eye: Secondary | ICD-10-CM | POA: Diagnosis not present

## 2022-06-19 DIAGNOSIS — H02834 Dermatochalasis of left upper eyelid: Secondary | ICD-10-CM | POA: Diagnosis not present

## 2022-06-25 ENCOUNTER — Ambulatory Visit: Payer: Medicare Other | Attending: Internal Medicine | Admitting: *Deleted

## 2022-06-25 ENCOUNTER — Other Ambulatory Visit: Payer: Self-pay | Admitting: *Deleted

## 2022-06-25 DIAGNOSIS — I6350 Cerebral infarction due to unspecified occlusion or stenosis of unspecified cerebral artery: Secondary | ICD-10-CM

## 2022-06-25 DIAGNOSIS — I480 Paroxysmal atrial fibrillation: Secondary | ICD-10-CM

## 2022-06-25 DIAGNOSIS — I059 Rheumatic mitral valve disease, unspecified: Secondary | ICD-10-CM

## 2022-06-25 DIAGNOSIS — Z9889 Other specified postprocedural states: Secondary | ICD-10-CM

## 2022-06-25 DIAGNOSIS — Z7901 Long term (current) use of anticoagulants: Secondary | ICD-10-CM | POA: Diagnosis not present

## 2022-06-25 DIAGNOSIS — G459 Transient cerebral ischemic attack, unspecified: Secondary | ICD-10-CM

## 2022-06-25 LAB — POCT INR: INR: 2.6 (ref 2.0–3.0)

## 2022-06-25 MED ORDER — WARFARIN SODIUM 5 MG PO TABS
ORAL_TABLET | ORAL | 1 refills | Status: DC
Start: 2022-06-25 — End: 2022-11-05

## 2022-06-25 NOTE — Patient Instructions (Addendum)
Description   Today take 2 tablets of warfarin then START Warfarin 1.5 tablets daily except 1 tablet on Tuesday, Thursday, and Saturday. Recheck in 2 weeks. [facility] [phone] or [phone]

## 2022-06-25 NOTE — Telephone Encounter (Signed)
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

## 2022-07-08 ENCOUNTER — Other Ambulatory Visit: Payer: Self-pay | Admitting: Cardiovascular Disease

## 2022-07-08 DIAGNOSIS — I10 Essential (primary) hypertension: Secondary | ICD-10-CM

## 2022-07-08 DIAGNOSIS — I4821 Permanent atrial fibrillation: Secondary | ICD-10-CM

## 2022-07-08 DIAGNOSIS — Z952 Presence of prosthetic heart valve: Secondary | ICD-10-CM

## 2022-07-09 ENCOUNTER — Ambulatory Visit: Payer: Medicare Other | Attending: Cardiovascular Disease | Admitting: *Deleted

## 2022-07-09 DIAGNOSIS — Z9889 Other specified postprocedural states: Secondary | ICD-10-CM

## 2022-07-09 DIAGNOSIS — I480 Paroxysmal atrial fibrillation: Secondary | ICD-10-CM

## 2022-07-09 DIAGNOSIS — G459 Transient cerebral ischemic attack, unspecified: Secondary | ICD-10-CM

## 2022-07-09 DIAGNOSIS — Z7901 Long term (current) use of anticoagulants: Secondary | ICD-10-CM | POA: Diagnosis not present

## 2022-07-09 DIAGNOSIS — I6350 Cerebral infarction due to unspecified occlusion or stenosis of unspecified cerebral artery: Secondary | ICD-10-CM | POA: Diagnosis not present

## 2022-07-09 DIAGNOSIS — I059 Rheumatic mitral valve disease, unspecified: Secondary | ICD-10-CM

## 2022-07-09 LAB — POCT INR: INR: 3.3 — AB (ref 2.0–3.0)

## 2022-07-09 NOTE — Patient Instructions (Signed)
Description   Continue taking Warfarin 1.5 tablets daily except 1 tablet on Tuesday, Thursday, and Saturday. Recheck in 3 weeks. Coumadin Clinic [phone] or [phone]

## 2022-07-10 ENCOUNTER — Other Ambulatory Visit: Payer: Self-pay | Admitting: *Deleted

## 2022-07-10 DIAGNOSIS — I10 Essential (primary) hypertension: Secondary | ICD-10-CM

## 2022-07-10 DIAGNOSIS — I4821 Permanent atrial fibrillation: Secondary | ICD-10-CM

## 2022-07-10 DIAGNOSIS — Z952 Presence of prosthetic heart valve: Secondary | ICD-10-CM

## 2022-07-10 MED ORDER — DIPYRIDAMOLE 50 MG PO TABS
50.0000 mg | ORAL_TABLET | Freq: Three times a day (TID) | ORAL | 3 refills | Status: DC
Start: 2022-07-10 — End: 2022-11-05

## 2022-07-19 ENCOUNTER — Other Ambulatory Visit (INDEPENDENT_AMBULATORY_CARE_PROVIDER_SITE_OTHER): Payer: Medicare Other

## 2022-07-19 DIAGNOSIS — E1159 Type 2 diabetes mellitus with other circulatory complications: Secondary | ICD-10-CM | POA: Diagnosis not present

## 2022-07-19 LAB — COMPREHENSIVE METABOLIC PANEL
ALT: 12 U/L (ref 0–53)
AST: 23 U/L (ref 0–37)
Albumin: 3.9 g/dL (ref 3.5–5.2)
Alkaline Phosphatase: 87 U/L (ref 39–117)
BUN: 27 mg/dL — ABNORMAL HIGH (ref 6–23)
CO2: 28 mEq/L (ref 19–32)
Calcium: 9.2 mg/dL (ref 8.4–10.5)
Chloride: 100 mEq/L (ref 96–112)
Creatinine, Ser: 1.2 mg/dL (ref 0.40–1.50)
GFR: 54.55 mL/min — ABNORMAL LOW (ref >60.00)
Glucose, Bld: 88 mg/dL (ref 70–99)
Potassium: 4.6 mEq/L (ref 3.5–5.1)
Sodium: 137 mEq/L (ref 135–145)
Total Bilirubin: 1 mg/dL (ref 0.2–1.2)
Total Protein: 7 g/dL (ref 6.0–8.3)

## 2022-07-19 LAB — HEMOGLOBIN A1C: Hgb A1c MFr Bld: 5.7 % (ref 4.6–6.5)

## 2022-07-23 ENCOUNTER — Encounter: Payer: Self-pay | Admitting: Internal Medicine

## 2022-07-23 ENCOUNTER — Ambulatory Visit (INDEPENDENT_AMBULATORY_CARE_PROVIDER_SITE_OTHER): Payer: Medicare Other | Admitting: Internal Medicine

## 2022-07-23 VITALS — BP 120/62 | HR 69 | Temp 98.4°F | Ht 69.0 in | Wt 157.0 lb

## 2022-07-23 DIAGNOSIS — N183 Chronic kidney disease, stage 3 unspecified: Secondary | ICD-10-CM

## 2022-07-23 DIAGNOSIS — I480 Paroxysmal atrial fibrillation: Secondary | ICD-10-CM | POA: Diagnosis not present

## 2022-07-23 DIAGNOSIS — E1159 Type 2 diabetes mellitus with other circulatory complications: Secondary | ICD-10-CM | POA: Diagnosis not present

## 2022-07-23 DIAGNOSIS — I5032 Chronic diastolic (congestive) heart failure: Secondary | ICD-10-CM | POA: Diagnosis not present

## 2022-07-23 DIAGNOSIS — D509 Iron deficiency anemia, unspecified: Secondary | ICD-10-CM

## 2022-07-23 MED ORDER — FLUTICASONE PROPIONATE 50 MCG/ACT NA SUSP: 2.0000 | Freq: Every day | NASAL | 6 refills | Status: DC

## 2022-07-23 MED ORDER — IPRATROPIUM BROMIDE 0.03 % NA SOLN: 2.0000 | Freq: Four times a day (QID) | NASAL | 3 refills | Status: DC | PRN

## 2022-07-23 MED ORDER — LORATADINE 10 MG PO TABS
10.0000 mg | ORAL_TABLET | Freq: Every day | ORAL | 11 refills | Status: DC
Start: 2022-07-23 — End: 2023-11-15

## 2022-07-23 NOTE — Assessment & Plan Note (Signed)
On Coumadin 

## 2022-07-23 NOTE — Assessment & Plan Note (Signed)
On ASA, Lipitor 

## 2022-07-23 NOTE — Progress Notes (Signed)
Subjective:  Patient ID: Anthony Gould, male    DOB: 1934/03/08  Age: 89 y.o. MRN: [id number]  CC: Follow-up (6 week f/u)   HPI ASHVIN ADELSON presents for pancreatitis, CHF, GERD f/u [facility] Drone is sick w/a GI virus C/o runny nose - worse  Outpatient Medications Prior to Visit  Medication Sig Dispense Refill   acetaminophen (TYLENOL) 500 MG tablet Take 500 mg by mouth every 6 (six) hours as needed for fever.     allopurinol (ZYLOPRIM) 100 MG tablet Take 0.5 tablets (50 mg total) by mouth daily. 90 tablet 3   aspirin 81 MG EC tablet Take 81 mg by mouth daily.     bisoprolol (ZEBETA) 5 MG tablet Take 1 tablet (5 mg total) by mouth daily. 90 tablet 2   Cholecalciferol (VITAMIN D3) 50 MCG (2000 UT) capsule TAKE 1 CAPSULE BY MOUTH EVERY DAY 100 capsule 3   dipyridamole (PERSANTINE) 50 MG tablet Take 1 tablet (50 mg total) by mouth 3 (three) times daily. 270 tablet 3   HYDROcodone-acetaminophen (NORCO) 7.5-325 MG tablet Take 0.5-1 tablets by mouth every 6 (six) hours as needed. 60 tablet 0   latanoprost (XALATAN) 0.005 % ophthalmic solution Place 1 drop into both eyes at bedtime.     methylPREDNISolone (MEDROL DOSEPAK) 4 MG TBPK tablet As directed for gout attack 21 tablet 0   pantoprazole (PROTONIX) 40 MG tablet Take 1 tablet by mouth daily.     sacubitril-valsartan (ENTRESTO) 49-51 MG Take 1 tablet by mouth 2 (two) times daily. 60 tablet 11   torsemide (DEMADEX) 20 MG tablet Take 3 tablets (60 mg total) by mouth daily. 270 tablet 3   warfarin (COUMADIN) 5 MG tablet Take 1 to 1&1/2 tablets by mouth daily as directed by the Anticoagulation Clinic. 130 tablet 1   No facility-administered medications prior to visit.    ROS: Review of Systems  Constitutional:  Negative for appetite change, fatigue and unexpected weight change.  HENT:  Positive for congestion, postnasal drip, rhinorrhea and sneezing. Negative for nosebleeds, sore throat, trouble swallowing and voice change.   Eyes:   Negative for itching and visual disturbance.  Respiratory:  Negative for cough.   Cardiovascular:  Negative for chest pain, palpitations and leg swelling.  Gastrointestinal:  Negative for abdominal distention, blood in stool, constipation, diarrhea and nausea.  Genitourinary:  Negative for frequency and hematuria.  Musculoskeletal:  Negative for back pain, gait problem, joint swelling and neck pain.  Skin:  Negative for rash.  Neurological:  Negative for dizziness, tremors, speech difficulty and weakness.  Psychiatric/Behavioral:  Negative for agitation, dysphoric mood and sleep disturbance. The patient is not nervous/anxious.     Objective:  BP 120/62 (BP Location: Left Arm, Patient Position: Sitting, Cuff Size: Normal)   Pulse 69   Temp 98.4 F (36.9 C) (Oral)   Ht 5\' 9"  (1.753 m)   Wt 157 lb (71.2 kg)   SpO2 97%   BMI 23.18 kg/m   BP Readings from Last 3 Encounters:  07/23/22 120/62  06/11/22 124/60  04/12/22 122/68    Wt Readings from Last 3 Encounters:  07/23/22 157 lb (71.2 kg)  06/11/22 154 lb (69.9 kg)  04/12/22 153 lb (69.4 kg)    Physical Exam  Lab Results  Component Value Date   WBC 4.3 06/04/2022   HGB 12.2 (L) 06/04/2022   HCT 35.8 (L) 06/04/2022   PLT 202.0 06/04/2022   GLUCOSE 88 07/19/2022   CHOL 153 05/01/2021   TRIG 73  05/01/2021   HDL 66 05/01/2021   LDLCALC 73 05/01/2021   ALT 12 07/19/2022   AST 23 07/19/2022   NA 137 07/19/2022   K 4.6 07/19/2022   CL 100 07/19/2022   CREATININE 1.20 07/19/2022   BUN 27 (H) 07/19/2022   CO2 28 07/19/2022   TSH 6.199 (H) 02/04/2021   PSA 0.87 10/20/2014   INR 3.3 (A) 07/09/2022   HGBA1C 5.7 07/19/2022    ECHOCARDIOGRAM COMPLETE  Result Date: 02/27/2021    ECHOCARDIOGRAM REPORT   Patient Name:   Anthony Gould Date of Exam: 02/27/2021 Medical Rec #:  [id number]         Height:       69.0 in Accession #:    [id number]        Weight:       167.6 lb Date of Birth:  October 22, 1933         BSA:          1.917  m Patient Age:    87 years          BP:           124/56 mmHg Patient Gender: M                 HR:           69 bpm. Exam Location:  Inpatient Procedure: 2D Echo, 3D Echo, Cardiac Doppler and Color Doppler Indications:     I50.40* Unspecified combined systolic (congestive) and                  diastolic (congestive) heart failure  History:         Patient has prior history of Echocardiogram examinations, most                  recent 03/09/2019. CHF, Abnormal ECG, Pulmonary HTN and TIA,                  Mitral Valve Disease, Arrythmias:Atrial Fibrillation and NSVT,                  Signs/Symptoms:Chest Pain and Syncope; Risk                  Factors:Hypertension and Diabetes. Mechanical mitral valve.                   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 Procedure Date: 64.  Sonographer:     Sheralyn Boatman RDCS Referring Phys:  [id number] [facility] [place] Diagnosing Phys: Epifanio Lesches MD  Sonographer Comments: Technically difficult study due to poor echo windows. IMPRESSIONS  1. Left ventricular ejection fraction, by estimation, is 40 to 45%. The left ventricle has mildly decreased function. The left ventricle demonstrates regional wall motion abnormalities (see scoring diagram/findings for description). Septal hypokinesis. There is moderate left ventricular hypertrophy. Left ventricular diastolic parameters are indeterminate.  2. Right ventricular systolic function is moderately reduced. The right ventricular size is moderately enlarged. There is moderately elevated pulmonary artery systolic pressure. The estimated right ventricular systolic pressure is 53.2 mmHg.  3. Left atrial size was severely dilated.  4. Right atrial size was severely dilated.  5. 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 Procedure Date: 28. Trivial mitral valve regurgitation.     MG at 63bpm,  EOA 2.4 cm^2  6. The tricuspid  valve is abnormal. Tricuspid valve regurgitation is severe.  7. The aortic valve is tricuspid. Aortic valve regurgitation is trivial. Aortic valve sclerosis is present, with no evidence of aortic valve stenosis.  8. The inferior vena cava is dilated in size with <50% respiratory variability, suggesting right atrial pressure of 15 mmHg. FINDINGS  Left Ventricle: Left ventricular ejection fraction, by estimation, is 40 to 45%. The left ventricle has mildly decreased function. The left ventricle demonstrates regional wall motion abnormalities. The left ventricular internal cavity size was normal in size. There is moderate left ventricular hypertrophy. Left ventricular diastolic parameters are indeterminate.  LV Wall Scoring: The entire septum is hypokinetic. The entire anterior wall, entire lateral wall, entire inferior wall, and apex are normal. Right Ventricle: The right ventricular size is moderately enlarged. Right vetricular wall thickness was not well visualized. Right ventricular systolic function is moderately reduced. There is moderately elevated pulmonary artery systolic pressure. The tricuspid regurgitant velocity is 3.09 m/s, and with an assumed right atrial pressure of 15 mmHg, the estimated right ventricular systolic pressure is 53.2 mmHg. Left Atrium: Left atrial size was severely dilated. Right Atrium: Right atrial size was severely dilated. Pericardium: There is no evidence of pericardial effusion. Mitral Valve: The mitral valve has been repaired/replaced. Trivial mitral valve regurgitation. There is a unknown size Medtronic tilting disc valve present in the mitral position. Procedure Date: 53. MV peak gradient, 13.5 mmHg. The mean mitral valve gradient is 4.0 mmHg. Tricuspid Valve: The tricuspid valve is abnormal. Tricuspid valve regurgitation is severe. Aortic Valve: The aortic valve is tricuspid. Aortic valve regurgitation is trivial. Aortic valve sclerosis is present, with no evidence of aortic  valve stenosis. Pulmonic Valve: The pulmonic valve was not well visualized. Pulmonic valve regurgitation is trivial. Aorta: The aortic root and ascending aorta are structurally normal, with no evidence of dilitation. Venous: The inferior vena cava is dilated in size with less than 50% respiratory variability, suggesting right atrial pressure of 15 mmHg. IAS/Shunts: The interatrial septum was not well visualized.  LEFT VENTRICLE PLAX 2D LVIDd:         5.10 cm      Diastology LVIDs:         4.00 cm      LV e' medial:    7.40 cm/s LV PW:         2.00 cm      LV E/e' medial:  22.2 LV IVS:        0.80 cm      LV e' lateral:   7.29 cm/s LVOT diam:     2.40 cm      LV E/e' lateral: 22.5 LV SV:         97 LV SV Index:   51 LVOT Area:     4.52 cm  LV Volumes (MOD) LV vol d, MOD A2C: 135.0 ml LV vol d, MOD A4C: 105.0 ml LV vol s, MOD A2C: 69.9 ml LV vol s, MOD A4C: 59.0 ml LV SV MOD A2C:     65.1 ml LV SV MOD A4C:     105.0 ml LV SV MOD BP:      53.6 ml RIGHT VENTRICLE            IVC RV S prime:     8.33 cm/s  IVC diam: 2.70 cm TAPSE (M-mode): 1.4 cm LEFT ATRIUM            Index  RIGHT ATRIUM           Index LA diam:      5.80 cm  3.03 cm/m   RA Area:     39.60 cm LA Vol (A2C): 72.1 ml  37.62 ml/m  RA Volume:   166.00 ml 86.62 ml/m LA Vol (A4C): 140.0 ml 73.05 ml/m  AORTIC VALVE             PULMONIC VALVE LVOT Vmax:   104.00 cm/s PR End Diast Vel: 2.22 msec LVOT Vmean:  67.300 cm/s LVOT VTI:    0.215 m  AORTA Ao Root diam: 3.60 cm Ao Asc diam:  3.50 cm MITRAL VALVE                TRICUSPID VALVE MV Area (PHT): 3.48 cm     TR Peak grad:   38.2 mmHg MV Area VTI:   2.61 cm     TR Vmax:        309.00 cm/s MV Peak grad:  13.5 mmHg MV Mean grad:  4.0 mmHg     SHUNTS MV Vmax:       1.84 m/s     Systemic VTI:  0.22 m MV Vmean:      88.9 cm/s    Systemic Diam: 2.40 cm MV Decel Time: 218 msec MV E velocity: 164.00 cm/s Epifanio Lesches MD Electronically signed by Epifanio Lesches MD Signature Date/Time:  02/27/2021/6:41:19 PM    Final (Updated)     Assessment & Plan:   Problem List Items Addressed This Visit     DM2 (diabetes mellitus, type 2) (HCC) - Primary    On ASA, Lipitor      Paroxysmal atrial fibrillation (HCC)    On Coumadin      Chronic diastolic heart failure (HCC)    On Entresto qd      CRI (chronic renal insufficiency), stage 3 (moderate) (HCC)    On Demadex qod      Iron deficiency anemia    Check CBC         Meds ordered this encounter  Medications   loratadine (CLARITIN) 10 MG tablet    Sig: Take 1 tablet (10 mg total) by mouth daily.    Dispense:  90 tablet    Refill:  11   fluticasone (FLONASE) 50 MCG/ACT nasal spray    Sig: Place 2 sprays into both nostrils daily.    Dispense:  16 g    Refill:  6   ipratropium (ATROVENT) 0.03 % nasal spray    Sig: Place 2 sprays into both nostrils 4 (four) times daily as needed for up to 3 days for rhinitis.    Dispense:  30 mL    Refill:  3      Follow-up: Return in about 2 months (around 09/22/2022) for a follow-up visit.  Sonda Primes, MD

## 2022-07-23 NOTE — Assessment & Plan Note (Signed)
On Demadex qod

## 2022-07-23 NOTE — Assessment & Plan Note (Signed)
Check CBC 

## 2022-07-23 NOTE — Assessment & Plan Note (Signed)
On Entresto qd

## 2022-07-30 ENCOUNTER — Ambulatory Visit: Payer: Medicare Other | Attending: Cardiovascular Disease

## 2022-07-30 DIAGNOSIS — I6350 Cerebral infarction due to unspecified occlusion or stenosis of unspecified cerebral artery: Secondary | ICD-10-CM | POA: Diagnosis not present

## 2022-07-30 DIAGNOSIS — Z7901 Long term (current) use of anticoagulants: Secondary | ICD-10-CM

## 2022-07-30 DIAGNOSIS — G459 Transient cerebral ischemic attack, unspecified: Secondary | ICD-10-CM | POA: Diagnosis not present

## 2022-07-30 DIAGNOSIS — I480 Paroxysmal atrial fibrillation: Secondary | ICD-10-CM | POA: Diagnosis not present

## 2022-07-30 DIAGNOSIS — I059 Rheumatic mitral valve disease, unspecified: Secondary | ICD-10-CM

## 2022-07-30 DIAGNOSIS — Z9889 Other specified postprocedural states: Secondary | ICD-10-CM

## 2022-07-30 LAB — POCT INR: INR: 3.7 — AB (ref 2.0–3.0)

## 2022-07-30 NOTE — Patient Instructions (Signed)
Description   Only take 1/2 tablet tomorrow and then START taking Warfarin 1 tablet daily except 1.5 tablets on Monday, Wednesday and Friday. Recheck in 2 weeks.  [facility] [phone]

## 2022-08-13 ENCOUNTER — Ambulatory Visit: Payer: Medicare Other | Attending: Cardiovascular Disease

## 2022-08-13 DIAGNOSIS — I059 Rheumatic mitral valve disease, unspecified: Secondary | ICD-10-CM | POA: Diagnosis not present

## 2022-08-13 DIAGNOSIS — G459 Transient cerebral ischemic attack, unspecified: Secondary | ICD-10-CM | POA: Diagnosis not present

## 2022-08-13 DIAGNOSIS — I480 Paroxysmal atrial fibrillation: Secondary | ICD-10-CM | POA: Diagnosis not present

## 2022-08-13 DIAGNOSIS — Z9889 Other specified postprocedural states: Secondary | ICD-10-CM

## 2022-08-13 DIAGNOSIS — Z7901 Long term (current) use of anticoagulants: Secondary | ICD-10-CM | POA: Diagnosis not present

## 2022-08-13 DIAGNOSIS — I6350 Cerebral infarction due to unspecified occlusion or stenosis of unspecified cerebral artery: Secondary | ICD-10-CM

## 2022-08-13 LAB — POCT INR: INR: 2.9 (ref 2.0–3.0)

## 2022-08-13 NOTE — Patient Instructions (Signed)
Take 2 tablets today only then continue taking Warfarin 1 tablet daily except 1.5 tablets on Monday, Wednesday and Friday. Recheck in 3 weeks.  [facility] [phone]

## 2022-08-21 ENCOUNTER — Other Ambulatory Visit: Payer: Self-pay | Admitting: Internal Medicine

## 2022-09-03 ENCOUNTER — Ambulatory Visit: Payer: Medicare Other | Attending: Internal Medicine | Admitting: *Deleted

## 2022-09-03 DIAGNOSIS — G459 Transient cerebral ischemic attack, unspecified: Secondary | ICD-10-CM

## 2022-09-03 DIAGNOSIS — Z9889 Other specified postprocedural states: Secondary | ICD-10-CM

## 2022-09-03 DIAGNOSIS — I6350 Cerebral infarction due to unspecified occlusion or stenosis of unspecified cerebral artery: Secondary | ICD-10-CM

## 2022-09-03 DIAGNOSIS — I480 Paroxysmal atrial fibrillation: Secondary | ICD-10-CM | POA: Diagnosis not present

## 2022-09-03 DIAGNOSIS — Z7901 Long term (current) use of anticoagulants: Secondary | ICD-10-CM | POA: Diagnosis not present

## 2022-09-03 DIAGNOSIS — I059 Rheumatic mitral valve disease, unspecified: Secondary | ICD-10-CM

## 2022-09-03 LAB — POCT INR: INR: 3.1 — AB (ref 2.0–3.0)

## 2022-09-03 NOTE — Patient Instructions (Signed)
Description   Continue taking Warfarin 1 tablet daily except 1.5 tablets on Monday, Wednesday and Friday. Recheck in 4 weeks.  [facility] [phone]

## 2022-09-04 DIAGNOSIS — M1612 Unilateral primary osteoarthritis, left hip: Secondary | ICD-10-CM | POA: Diagnosis not present

## 2022-09-04 DIAGNOSIS — M170 Bilateral primary osteoarthritis of knee: Secondary | ICD-10-CM | POA: Diagnosis not present

## 2022-09-12 DIAGNOSIS — M1712 Unilateral primary osteoarthritis, left knee: Secondary | ICD-10-CM | POA: Diagnosis not present

## 2022-09-24 ENCOUNTER — Encounter: Payer: Self-pay | Admitting: Internal Medicine

## 2022-09-24 ENCOUNTER — Ambulatory Visit (INDEPENDENT_AMBULATORY_CARE_PROVIDER_SITE_OTHER): Payer: Medicare Other | Admitting: Internal Medicine

## 2022-09-24 VITALS — BP 120/60 | HR 57 | Temp 97.9°F | Ht 69.0 in | Wt 159.0 lb

## 2022-09-24 DIAGNOSIS — K7460 Unspecified cirrhosis of liver: Secondary | ICD-10-CM | POA: Diagnosis not present

## 2022-09-24 DIAGNOSIS — E1159 Type 2 diabetes mellitus with other circulatory complications: Secondary | ICD-10-CM | POA: Diagnosis not present

## 2022-09-24 DIAGNOSIS — G459 Transient cerebral ischemic attack, unspecified: Secondary | ICD-10-CM | POA: Diagnosis not present

## 2022-09-24 DIAGNOSIS — I5082 Biventricular heart failure: Secondary | ICD-10-CM

## 2022-09-24 DIAGNOSIS — M1000 Idiopathic gout, unspecified site: Secondary | ICD-10-CM | POA: Diagnosis not present

## 2022-09-24 DIAGNOSIS — Z7901 Long term (current) use of anticoagulants: Secondary | ICD-10-CM

## 2022-09-24 DIAGNOSIS — N183 Chronic kidney disease, stage 3 unspecified: Secondary | ICD-10-CM

## 2022-09-24 LAB — CBC WITH DIFFERENTIAL/PLATELET
Basophils Absolute: 0 10*3/uL (ref 0.0–0.1)
Basophils Relative: 0.9 % (ref 0.0–3.0)
Eosinophils Absolute: 0.7 10*3/uL (ref 0.0–0.7)
Eosinophils Relative: 13.4 % — ABNORMAL HIGH (ref 0.0–5.0)
HCT: 36.5 % — ABNORMAL LOW (ref 39.0–52.0)
Hemoglobin: 12.1 g/dL — ABNORMAL LOW (ref 13.0–17.0)
Lymphocytes Relative: 20.4 % (ref 12.0–46.0)
Lymphs Abs: 1 10*3/uL (ref 0.7–4.0)
MCHC: 33.1 g/dL (ref 30.0–36.0)
MCV: 97.9 fl (ref 78.0–100.0)
Monocytes Absolute: 0.5 10*3/uL (ref 0.1–1.0)
Monocytes Relative: 9.5 % (ref 3.0–12.0)
Neutro Abs: 2.7 10*3/uL (ref 1.4–7.7)
Neutrophils Relative %: 55.8 % (ref 43.0–77.0)
Platelets: 202 10*3/uL (ref 150.0–400.0)
RBC: 3.73 Mil/uL — ABNORMAL LOW (ref 4.22–5.81)
RDW: 15.7 % — ABNORMAL HIGH (ref 11.5–15.5)
WBC: 4.9 10*3/uL (ref 4.0–10.5)

## 2022-09-24 LAB — COMPREHENSIVE METABOLIC PANEL
ALT: 17 U/L (ref 0–53)
AST: 25 U/L (ref 0–37)
Albumin: 4.2 g/dL (ref 3.5–5.2)
Alkaline Phosphatase: 98 U/L (ref 39–117)
BUN: 33 mg/dL — ABNORMAL HIGH (ref 6–23)
CO2: 28 mEq/L (ref 19–32)
Calcium: 9.6 mg/dL (ref 8.4–10.5)
Chloride: 96 mEq/L (ref 96–112)
Creatinine, Ser: 1.43 mg/dL (ref 0.40–1.50)
GFR: 44.15 mL/min — ABNORMAL LOW (ref >60.00)
Glucose, Bld: 103 mg/dL — ABNORMAL HIGH (ref 70–99)
Potassium: 4.6 mEq/L (ref 3.5–5.1)
Sodium: 131 mEq/L — ABNORMAL LOW (ref 135–145)
Total Bilirubin: 0.8 mg/dL (ref 0.2–1.2)
Total Protein: 7.5 g/dL (ref 6.0–8.3)

## 2022-09-24 NOTE — Progress Notes (Signed)
Subjective:  Patient ID: MARTON BRAATZ, male    DOB: [birth year]  Age: 89 y.o. MRN: [id number]  CC: Follow-up (2 mnth f/u)   HPI TAYVEN FURE presents for gout, L knee OA - on shots x3. F/u CHF, gout  Outpatient Medications Prior to Visit  Medication Sig Dispense Refill   acetaminophen (TYLENOL) 500 MG tablet Take 500 mg by mouth every 6 (six) hours as needed for fever.     allopurinol (ZYLOPRIM) 100 MG tablet TAKE 1 TABLET(100 MG) BY MOUTH DAILY 90 tablet 3   aspirin 81 MG EC tablet Take 81 mg by mouth daily.     bisoprolol (ZEBETA) 5 MG tablet Take 1 tablet (5 mg total) by mouth daily. 90 tablet 2   Cholecalciferol (VITAMIN D3) 50 MCG (2000 UT) capsule TAKE 1 CAPSULE BY MOUTH EVERY DAY 100 capsule 3   dipyridamole (PERSANTINE) 50 MG tablet Take 1 tablet (50 mg total) by mouth 3 (three) times daily. 270 tablet 3   fluticasone (FLONASE) 50 MCG/ACT nasal spray Place 2 sprays into both nostrils daily. 16 g 6   HYDROcodone-acetaminophen (NORCO) 7.5-325 MG tablet Take 0.5-1 tablets by mouth every 6 (six) hours as needed. 60 tablet 0   latanoprost (XALATAN) 0.005 % ophthalmic solution Place 1 drop into both eyes at bedtime.     loratadine (CLARITIN) 10 MG tablet Take 1 tablet (10 mg total) by mouth daily. 90 tablet 11   methylPREDNISolone (MEDROL DOSEPAK) 4 MG TBPK tablet As directed for gout attack 21 tablet 0   pantoprazole (PROTONIX) 40 MG tablet Take 1 tablet by mouth daily.     sacubitril-valsartan (ENTRESTO) 49-51 MG Take 1 tablet by mouth 2 (two) times daily. 60 tablet 11   torsemide (DEMADEX) 20 MG tablet Take 3 tablets (60 mg total) by mouth daily. 270 tablet 3   warfarin (COUMADIN) 5 MG tablet Take 1 to 1&1/2 tablets by mouth daily as directed by the Anticoagulation Clinic. 130 tablet 1   ipratropium (ATROVENT) 0.03 % nasal spray Place 2 sprays into both nostrils 4 (four) times daily as needed for up to 3 days for rhinitis. 30 mL 3   No facility-administered medications  prior to visit.    ROS: Review of Systems  Constitutional:  Positive for fatigue. Negative for appetite change and unexpected weight change.  HENT:  Negative for congestion, nosebleeds, sneezing, sore throat and trouble swallowing.   Eyes:  Negative for itching and visual disturbance.  Respiratory:  Negative for cough.   Cardiovascular:  Negative for chest pain, palpitations and leg swelling.  Gastrointestinal:  Negative for abdominal distention, blood in stool, diarrhea and nausea.  Genitourinary:  Negative for frequency and hematuria.  Musculoskeletal:  Positive for arthralgias, back pain and gait problem. Negative for joint swelling and neck pain.  Skin:  Negative for rash.  Neurological:  Negative for dizziness, tremors, speech difficulty and weakness.  Psychiatric/Behavioral:  Negative for agitation, dysphoric mood and sleep disturbance. The patient is not nervous/anxious.     Objective:  BP 120/60 (BP Location: Left Arm, Patient Position: Sitting, Cuff Size: Large)   Pulse (!) 57   Temp 97.9 F (36.6 C) (Oral)   Ht 5\' 9"  (1.753 m)   Wt 159 lb (72.1 kg)   SpO2 99%   BMI 23.48 kg/m   BP Readings from Last 3 Encounters:  09/24/22 120/60  07/23/22 120/62  06/11/22 124/60    Wt Readings from Last 3 Encounters:  09/24/22 159 lb (72.1 kg)  07/23/22 157 lb (71.2 kg)  06/11/22 154 lb (69.9 kg)    Physical Exam Constitutional:      General: He is not in acute distress.    Appearance: Normal appearance. He is well-developed.     Comments: NAD  Eyes:     Conjunctiva/sclera: Conjunctivae normal.     Pupils: Pupils are equal, round, and reactive to light.  Neck:     Thyroid: No thyromegaly.     Vascular: No JVD.  Cardiovascular:     Rate and Rhythm: Normal rate and regular rhythm.     Heart sounds: Normal heart sounds. No murmur heard.    No friction rub. No gallop.  Pulmonary:     Effort: Pulmonary effort is normal. No respiratory distress.     Breath sounds: Normal  breath sounds. No wheezing or rales.  Chest:     Chest wall: No tenderness.  Abdominal:     General: Bowel sounds are normal. There is no distension.     Palpations: Abdomen is soft. There is no mass.     Tenderness: There is no abdominal tenderness. There is no guarding or rebound.  Musculoskeletal:        General: No tenderness. Normal range of motion.     Cervical back: Normal range of motion.     Right lower leg: No edema.     Left lower leg: No edema.  Lymphadenopathy:     Cervical: No cervical adenopathy.  Skin:    General: Skin is warm and dry.     Findings: No rash.  Neurological:     Mental Status: He is alert and oriented to person, place, and time.     Cranial Nerves: No cranial nerve deficit.     Motor: No abnormal muscle tone.     Coordination: Coordination normal.     Gait: Gait normal.     Deep Tendon Reflexes: Reflexes are normal and symmetric.  Psychiatric:        Behavior: Behavior normal.        Thought Content: Thought content normal.        Judgment: Judgment normal.   L knee w/mild pain  Lab Results  Component Value Date   WBC 4.3 06/04/2022   HGB 12.2 (L) 06/04/2022   HCT 35.8 (L) 06/04/2022   PLT 202.0 06/04/2022   GLUCOSE 88 07/19/2022   CHOL 153 05/01/2021   TRIG 73 05/01/2021   HDL 66 05/01/2021   LDLCALC 73 05/01/2021   ALT 12 07/19/2022   AST 23 07/19/2022   NA 137 07/19/2022   K 4.6 07/19/2022   CL 100 07/19/2022   CREATININE 1.20 07/19/2022   BUN 27 (H) 07/19/2022   CO2 28 07/19/2022   TSH 6.199 (H) 02/04/2021   PSA 0.87 10/20/2014   INR 3.1 (A) 09/03/2022   HGBA1C 5.7 07/19/2022    ECHOCARDIOGRAM COMPLETE  Result Date: 02/27/2021    ECHOCARDIOGRAM REPORT   Patient Name:   COSTON SHIVE Date of Exam: 02/27/2021 Medical Rec #:  [id number]         Height:       69.0 in Accession #:    [id number]        Weight:       167.6 lb Date of Birth:  01/29/1934         BSA:          1.917 m Patient Age:    32 years  BP:            124/56 mmHg Patient Gender: M                 HR:           69 bpm. Exam Location:  Inpatient Procedure: 2D Echo, 3D Echo, Cardiac Doppler and Color Doppler Indications:     I50.40* Unspecified combined systolic (congestive) and                  diastolic (congestive) heart failure  History:         Patient has prior history of Echocardiogram examinations, most                  recent 03/09/2019. CHF, Abnormal ECG, Pulmonary HTN and TIA,                  Mitral Valve Disease, Arrythmias:Atrial Fibrillation and NSVT,                  Signs/Symptoms:Chest Pain and Syncope; Risk                  Factors:Hypertension and Diabetes. Mechanical mitral valve.                   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 Procedure Date: 74.  Sonographer:     Sheralyn Boatman RDCS Referring Phys:  [id number] [facility] [place] Diagnosing Phys: Epifanio Lesches MD  Sonographer Comments: Technically difficult study due to poor echo windows. IMPRESSIONS  1. Left ventricular ejection fraction, by estimation, is 40 to 45%. The left ventricle has mildly decreased function. The left ventricle demonstrates regional wall motion abnormalities (see scoring diagram/findings for description). Septal hypokinesis. There is moderate left ventricular hypertrophy. Left ventricular diastolic parameters are indeterminate.  2. Right ventricular systolic function is moderately reduced. The right ventricular size is moderately enlarged. There is moderately elevated pulmonary artery systolic pressure. The estimated right ventricular systolic pressure is 53.2 mmHg.  3. Left atrial size was severely dilated.  4. Right atrial size was severely dilated.  5. 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 Procedure Date: 76. Trivial mitral valve regurgitation.     MG at 63bpm, EOA 2.4 cm^2  6. The tricuspid valve is abnormal. Tricuspid valve regurgitation is  severe.  7. The aortic valve is tricuspid. Aortic valve regurgitation is trivial. Aortic valve sclerosis is present, with no evidence of aortic valve stenosis.  8. The inferior vena cava is dilated in size with <50% respiratory variability, suggesting right atrial pressure of 15 mmHg. FINDINGS  Left Ventricle: Left ventricular ejection fraction, by estimation, is 40 to 45%. The left ventricle has mildly decreased function. The left ventricle demonstrates regional wall motion abnormalities. The left ventricular internal cavity size was normal in size. There is moderate left ventricular hypertrophy. Left ventricular diastolic parameters are indeterminate.  LV Wall Scoring: The entire septum is hypokinetic. The entire anterior wall, entire lateral wall, entire inferior wall, and apex are normal. Right Ventricle: The right ventricular size is moderately enlarged. Right vetricular wall thickness was not well visualized. Right ventricular systolic function is moderately reduced. There is moderately elevated pulmonary artery systolic pressure. The tricuspid regurgitant velocity is 3.09 m/s, and with an assumed right atrial pressure of 15 mmHg, the estimated  right ventricular systolic pressure is 53.2 mmHg. Left Atrium: Left atrial size was severely dilated. Right Atrium: Right atrial size was severely dilated. Pericardium: There is no evidence of pericardial effusion. Mitral Valve: The mitral valve has been repaired/replaced. Trivial mitral valve regurgitation. There is a unknown size Medtronic tilting disc valve present in the mitral position. Procedure Date: 80. MV peak gradient, 13.5 mmHg. The mean mitral valve gradient is 4.0 mmHg. Tricuspid Valve: The tricuspid valve is abnormal. Tricuspid valve regurgitation is severe. Aortic Valve: The aortic valve is tricuspid. Aortic valve regurgitation is trivial. Aortic valve sclerosis is present, with no evidence of aortic valve stenosis. Pulmonic Valve: The pulmonic valve  was not well visualized. Pulmonic valve regurgitation is trivial. Aorta: The aortic root and ascending aorta are structurally normal, with no evidence of dilitation. Venous: The inferior vena cava is dilated in size with less than 50% respiratory variability, suggesting right atrial pressure of 15 mmHg. IAS/Shunts: The interatrial septum was not well visualized.  LEFT VENTRICLE PLAX 2D LVIDd:         5.10 cm      Diastology LVIDs:         4.00 cm      LV e' medial:    7.40 cm/s LV PW:         2.00 cm      LV E/e' medial:  22.2 LV IVS:        0.80 cm      LV e' lateral:   7.29 cm/s LVOT diam:     2.40 cm      LV E/e' lateral: 22.5 LV SV:         97 LV SV Index:   51 LVOT Area:     4.52 cm  LV Volumes (MOD) LV vol d, MOD A2C: 135.0 ml LV vol d, MOD A4C: 105.0 ml LV vol s, MOD A2C: 69.9 ml LV vol s, MOD A4C: 59.0 ml LV SV MOD A2C:     65.1 ml LV SV MOD A4C:     105.0 ml LV SV MOD BP:      53.6 ml RIGHT VENTRICLE            IVC RV S prime:     8.33 cm/s  IVC diam: 2.70 cm TAPSE (M-mode): 1.4 cm LEFT ATRIUM            Index        RIGHT ATRIUM           Index LA diam:      5.80 cm  3.03 cm/m   RA Area:     39.60 cm LA Vol (A2C): 72.1 ml  37.62 ml/m  RA Volume:   166.00 ml 86.62 ml/m LA Vol (A4C): 140.0 ml 73.05 ml/m  AORTIC VALVE             PULMONIC VALVE LVOT Vmax:   104.00 cm/s PR End Diast Vel: 2.22 msec LVOT Vmean:  67.300 cm/s LVOT VTI:    0.215 m  AORTA Ao Root diam: 3.60 cm Ao Asc diam:  3.50 cm MITRAL VALVE                TRICUSPID VALVE MV Area (PHT): 3.48 cm     TR Peak grad:   38.2 mmHg MV Area VTI:   2.61 cm     TR Vmax:        309.00 cm/s MV Peak grad:  13.5 mmHg MV Mean grad:  4.0 mmHg  SHUNTS MV Vmax:       1.84 m/s     Systemic VTI:  0.22 m MV Vmean:      88.9 cm/s    Systemic Diam: 2.40 cm MV Decel Time: 218 msec MV E velocity: 164.00 cm/s Epifanio Lesches MD Electronically signed by Epifanio Lesches MD Signature Date/Time: 02/27/2021/6:41:19 PM    Final (Updated)     Assessment &  Plan:   Problem List Items Addressed This Visit     DM2 (diabetes mellitus, type 2) (HCC) - Primary    On ASA, Lipitor      Transient cerebral ischemia    On ASA 81, coumadin      Gout    On Allopurinol -- on  50 mg/d      Long term (current) use of anticoagulants    On Coumadin      Cirrhosis of liver without ascites (HCC)    Off alcohol Monitoring LFTs      CRI (chronic renal insufficiency), stage 3 (moderate) (HCC)    Cont w/Entresto 49/51 Hydrate well      Biventricular heart failure (HCC)    Reduced Torsemide to 10 mg. Cont w/Entresto          No orders of the defined types were placed in this encounter.     Follow-up: No follow-ups on file.  Sonda Primes, MD

## 2022-09-24 NOTE — Assessment & Plan Note (Signed)
On Allopurinol -- on  50 mg/d

## 2022-09-24 NOTE — Assessment & Plan Note (Signed)
On Coumadin 

## 2022-09-24 NOTE — Assessment & Plan Note (Signed)
Off alcohol °Monitoring LFTs °

## 2022-09-24 NOTE — Assessment & Plan Note (Signed)
Cont w/Entresto 49/51 Hydrate well

## 2022-09-24 NOTE — Assessment & Plan Note (Signed)
On ASA 81, coumadin

## 2022-09-24 NOTE — Assessment & Plan Note (Signed)
Reduced Torsemide to 10 mg. Cont w/Entresto  

## 2022-09-24 NOTE — Assessment & Plan Note (Signed)
On ASA, Lipitor 

## 2022-10-01 ENCOUNTER — Ambulatory Visit: Payer: Medicare Other

## 2022-10-01 DIAGNOSIS — Z7901 Long term (current) use of anticoagulants: Secondary | ICD-10-CM | POA: Diagnosis not present

## 2022-10-01 DIAGNOSIS — I059 Rheumatic mitral valve disease, unspecified: Secondary | ICD-10-CM | POA: Diagnosis not present

## 2022-10-01 DIAGNOSIS — I480 Paroxysmal atrial fibrillation: Secondary | ICD-10-CM | POA: Diagnosis not present

## 2022-10-01 DIAGNOSIS — I6350 Cerebral infarction due to unspecified occlusion or stenosis of unspecified cerebral artery: Secondary | ICD-10-CM

## 2022-10-01 DIAGNOSIS — G459 Transient cerebral ischemic attack, unspecified: Secondary | ICD-10-CM | POA: Diagnosis not present

## 2022-10-01 DIAGNOSIS — Z9889 Other specified postprocedural states: Secondary | ICD-10-CM

## 2022-10-01 LAB — POCT INR: INR: 3 (ref 2.0–3.0)

## 2022-10-01 NOTE — Patient Instructions (Signed)
Continue taking Warfarin 1 tablet daily except 1.5 tablets on Monday, Wednesday and Friday. Recheck in 4 weeks.  [facility] [phone]

## 2022-10-09 ENCOUNTER — Telehealth: Payer: Self-pay | Admitting: Radiology

## 2022-10-09 NOTE — Telephone Encounter (Signed)
Was not able to leave voicemail for patient to call back. Please have pt call at [phone] to schedule Medicare Annual Wellness Visit    Last AWV:  no hx of AWV   Please schedule Sequential/Initial AWV with [facility] K. CMA

## 2022-10-25 DIAGNOSIS — M1712 Unilateral primary osteoarthritis, left knee: Secondary | ICD-10-CM | POA: Diagnosis not present

## 2022-10-29 ENCOUNTER — Ambulatory Visit: Payer: Medicare Other | Attending: Cardiovascular Disease | Admitting: *Deleted

## 2022-10-29 DIAGNOSIS — I059 Rheumatic mitral valve disease, unspecified: Secondary | ICD-10-CM

## 2022-10-29 DIAGNOSIS — Z7901 Long term (current) use of anticoagulants: Secondary | ICD-10-CM | POA: Diagnosis not present

## 2022-10-29 DIAGNOSIS — G459 Transient cerebral ischemic attack, unspecified: Secondary | ICD-10-CM

## 2022-10-29 DIAGNOSIS — I480 Paroxysmal atrial fibrillation: Secondary | ICD-10-CM

## 2022-10-29 DIAGNOSIS — Z9889 Other specified postprocedural states: Secondary | ICD-10-CM

## 2022-10-29 DIAGNOSIS — I6350 Cerebral infarction due to unspecified occlusion or stenosis of unspecified cerebral artery: Secondary | ICD-10-CM | POA: Diagnosis not present

## 2022-10-29 LAB — POCT INR: INR: 2 (ref 2.0–3.0)

## 2022-10-29 NOTE — Patient Instructions (Signed)
Description   Today take 2 tablets of warfarin then START taking Warfarin 1.5 tablets daily except 1 tablet on Sunday, Tuesday, and Thursday. Recheck in 3 weeks.  [facility] [phone]

## 2022-11-01 DIAGNOSIS — M1712 Unilateral primary osteoarthritis, left knee: Secondary | ICD-10-CM | POA: Diagnosis not present

## 2022-11-04 ENCOUNTER — Encounter: Payer: Self-pay | Admitting: Physician Assistant

## 2022-11-04 NOTE — Progress Notes (Unsigned)
Cardiology Office Note    Date:  11/05/2022  ID:  HUNG KRAVCHUK, DOB 1934-01-09, MRN [id number] PCP:  Tresa Garter, MD  Cardiologist:  Verne Carrow, MD  Electrophysiologist:  None   Chief Complaint: f/u CHF, MVR  History of Present Illness: .    Anthony Gould is a 89 y.o. male with visit-pertinent history of severe mitral regurgitation s/p [employer] MVR in 1986, permanent atrial fibrillation, chronic HFrEF, pulmonary HTN with elevated PASP, mild dilation of ascending aorta, HTN, HLD, venous insufficiency, PVCs, CKD stage III by labs, cirrhotic changes of the liver by imaging (followed by PCP), former ETOH use, DM, prior TIA on warfarin, syncope, NSVT seen for follow-up.  He has no prior history of coronary disease in his chart and recalled being told he had clean coronaries before his MVR. He had a nuclear stress test in 2014 which was normal along with reassuring echo 2016. He previously reported that ever since his MVR, he has had intermittent episodes of very transient disorientiation lasting about 10 seconds. The episodes used to be more frequent (5-10x/year) but then he got put on ASA and they decreased in frequency. He has also since been maintained in dypridamole and warfarin by his cardiologists (Dr. Okey Dupre then Dr. Clifton Jahquez). He was walking on the treadmill in 2020 when he felt that disorientation feeling followed by syncope (see 04/2019 OV for full details). He saw primary care and orthostatics were negative. 2D echocardiogram 02/2019 showed EF 55-60%, moderate LVH, normal RV, severe LAE/RAE, repaired/placed MV with trivial MR, moderate TR, moderately elevated pulmonary artery pressure without significant change from prior. Cardiac event monitor showed atrial fib with average HR 80bpm, frequent PVCs, brief episodes of NSVT (vs aberrancy) 3-7 beats without sustained events, and rare episodic bradycardia in the mid 40s, mostly during sleeping hours but one occasion  around 5pm. He declined neurology referral/EEG from PCP and also declined referral to EP/discussion of ILR. He had medical admissions in 2022 for appendix issues requiring appy and C diff c/b hypotension. During one of these admissions 02/2021 he was noted to have decline in LVEF and NSVT. This was managed medically and he eventually had titration of GDMT and diuresis during 2023. Last echo 10/2021 showed EF 30-35%, global HK, mildly reduced RVSF, severely enlarged RV, mild pulm HTN, severe BAE, mechanical MV without significant MR, mild dilation of ascending aorta. Medical therapy continued by last OV 03/2022. The patient tells me that cardiac cath was discussed at OV with Anthony Searing, NP, and that the patient elected against proceeding, preferring an approach of medical therapy only.  He is seen back for follow-up today overall doing well. He is accompanied by his wife. Overall he is doing great especially compared to the events of 2022 and 2023. He uses his torsemide in a sliding scale fashion, taking somewhere between 2-3 tablets a day depending on his weight fluctuations and swelling. With this method he's maintained excellent control of symptoms. Denies CP, SOB, palpitations. He is unsure if he is taking bisoprolol or not.  Labwork independently reviewed: 09/2022 Na 131, BUN 33, Cr 1.43, K 4.6, Hgb 12.1, plt 202 (PCP) 06/2022 A1c wnl 04/2021 trig 73, LDL 72  ROS: .    Please see the history of present illness. All other systems are reviewed and otherwise negative.  Studies Reviewed: [name]    EKG:  EKG is ordered today, personally reviewed, demonstrating atrial fibrillation 68bpm with occasional PVCs, right axis deviation, nonspecific STTW changes  CV  Studies: Cardiac studies reviewed are outlined and summarized above. Otherwise please see EMR for full report.   Current Reported Medications:.    Current Meds  Medication Sig   acetaminophen (TYLENOL) 500 MG tablet Take 500 mg by mouth every 6  (six) hours as needed for fever.   allopurinol (ZYLOPRIM) 100 MG tablet TAKE 1 TABLET(100 MG) BY MOUTH DAILY   aspirin 81 MG EC tablet Take 81 mg by mouth daily.   bisoprolol (ZEBETA) 5 MG tablet Take 1 tablet (5 mg total) by mouth daily.   Cholecalciferol (VITAMIN D3) 50 MCG (2000 UT) capsule TAKE 1 CAPSULE BY MOUTH EVERY DAY   dipyridamole (PERSANTINE) 50 MG tablet Take 1 tablet (50 mg total) by mouth 3 (three) times daily.   HYDROcodone-acetaminophen (NORCO) 7.5-325 MG tablet Take 0.5-1 tablets by mouth every 6 (six) hours as needed.   latanoprost (XALATAN) 0.005 % ophthalmic solution Place 1 drop into both eyes at bedtime.   loratadine (CLARITIN) 10 MG tablet Take 1 tablet (10 mg total) by mouth daily.   pantoprazole (PROTONIX) 40 MG tablet Take 1 tablet by mouth daily.   sacubitril-valsartan (ENTRESTO) 49-51 MG Take 1 tablet by mouth 2 (two) times daily.   torsemide (DEMADEX) 20 MG tablet Take 3 tablets (60 mg total) by mouth daily.   warfarin (COUMADIN) 5 MG tablet Take 1 to 1&1/2 tablets by mouth daily as directed by the Anticoagulation Clinic.   [DISCONTINUED] fluticasone (FLONASE) 50 MCG/ACT nasal spray Place 2 sprays into both nostrils daily.   [DISCONTINUED] methylPREDNISolone (MEDROL DOSEPAK) 4 MG TBPK tablet As directed for gout attack    Physical Exam:    VS:  BP 138/76   Pulse 68   Ht 5\' 9"  (1.753 m)   Wt 160 lb 9.6 oz (72.8 kg)   SpO2 98%   BMI 23.72 kg/m    Wt Readings from Last 3 Encounters:  11/05/22 160 lb 9.6 oz (72.8 kg)  09/24/22 159 lb (72.1 kg)  07/23/22 157 lb (71.2 kg)    GEN: Well nourished, well developed in no acute distress NECK: No JVD; No carotid bruits CARDIAC: irregular, rate controlled, valve click without significant murmurs, rubs, gallops RESPIRATORY:  Clear to auscultation without rales, wheezing or rhonchi  ABDOMEN: Soft, non-tender, non-distended EXTREMITIES:  No edema; No acute deformity   Asessement and Plan:.    1. Chronic HFrEF/HTN  - appears euvolemic on exam. He is very pleased with his current regimen. As above he does a sliding scale approach with his torsemide taking 2-3 tablets once a day. Continue Entresto as well. He reports BP has been overall 130s/<80s at home which seems appropriate given age and frailty. We'll see what labs show to help guide whether to consider adding SGLT2i - he reports PCP had been worried about kidneys recently. Recheck BMET today to trend recent hyponatremia, creatinine. He is noted to have occasional PVCs on EKG today which he has a history of, though not seen on EKG in 2023. We discussed further evaluation of this. Ultimately he prefers to start with repeat echocardiogram since he's been doing well. If EF has improved, we can continue current clinical course (with continuation of beta blocker) but if EF worsened, would offer repeat 3 day Zio to quantify PVC burden and refer to EP in case this is contributing to his cardiomyopathy.  He's unsure if he is taking bisoprolol or not so he will check at home. If he is not taking this I would suggest he resume. As  above, the patient declined cardiac catheterization for more definitive evaluation of his cardiomyopathy.  2. Permanent AF, h/o TIA - historically mantained on ASA + dipyridamole + warfarin per his primary cardiologist team (Dr. Okey Dupre then Dr. Clifton Jaece in more recent years). Given clinical stability, will not make any changes to this regimen. Bleeding precautions discussed. No new bleeding reported. Rate appears stable.  3. H/o NSVT, PVCs - no recent syncope. See above re: PVC discussion. Check BMET, Mg, TSH today.  4. Hyperlipidemia - last evaluated 04/2021. He has eaten today. He sees PCP next month so I've asked him to reach out to them to see if they would be willing to check fasting lipids at that visit.  4. MVR - clinically stable. Discussed SBE ppx and importance of dental health. Amoxicillin rx sent in, advised to take 4 tablets 30-60 minutes  prior to any dental work.  Ancillary diagnoses, including mild PHTN and mild dilation of ascending aorta, will also be assessed on echo as above.    Disposition: Tentatively f/u with Dr. Clifton Farzad or me in 6 months, sooner if echo abnormal or SGLT2i decided to be added.  Signed, Laurann Montana, [name]

## 2022-11-05 ENCOUNTER — Encounter: Payer: Self-pay | Admitting: Physician Assistant

## 2022-11-05 ENCOUNTER — Ambulatory Visit: Payer: Medicare Other | Attending: Physician Assistant | Admitting: Physician Assistant

## 2022-11-05 VITALS — BP 138/76 | HR 68 | Ht 69.0 in | Wt 160.6 lb

## 2022-11-05 DIAGNOSIS — I493 Ventricular premature depolarization: Secondary | ICD-10-CM

## 2022-11-05 DIAGNOSIS — I4821 Permanent atrial fibrillation: Secondary | ICD-10-CM

## 2022-11-05 DIAGNOSIS — I480 Paroxysmal atrial fibrillation: Secondary | ICD-10-CM

## 2022-11-05 DIAGNOSIS — Z952 Presence of prosthetic heart valve: Secondary | ICD-10-CM

## 2022-11-05 DIAGNOSIS — I10 Essential (primary) hypertension: Secondary | ICD-10-CM

## 2022-11-05 DIAGNOSIS — E785 Hyperlipidemia, unspecified: Secondary | ICD-10-CM

## 2022-11-05 DIAGNOSIS — I5022 Chronic systolic (congestive) heart failure: Secondary | ICD-10-CM

## 2022-11-05 MED ORDER — WARFARIN SODIUM 5 MG PO TABS
ORAL_TABLET | ORAL | 1 refills | Status: DC
Start: 2022-11-05 — End: 2023-08-22

## 2022-11-05 MED ORDER — ENTRESTO 49-51 MG PO TABS: 1.0000 | ORAL_TABLET | Freq: Two times a day (BID) | ORAL | 3 refills | Status: DC

## 2022-11-05 MED ORDER — AMOXICILLIN 500 MG PO TABS
ORAL_TABLET | ORAL | 6 refills | Status: AC
Start: 2022-11-05 — End: ?

## 2022-11-05 MED ORDER — DIPYRIDAMOLE 50 MG PO TABS
50.0000 mg | ORAL_TABLET | Freq: Three times a day (TID) | ORAL | 3 refills | Status: DC
Start: 2022-11-05 — End: 2023-12-23

## 2022-11-05 MED ORDER — BISOPROLOL FUMARATE 5 MG PO TABS: 5.0000 mg | ORAL_TABLET | Freq: Every day | ORAL | 3 refills | Status: DC

## 2022-11-05 NOTE — Patient Instructions (Addendum)
Medication Instructions:  Start Amoxicillin 500 mg, take 4 tablets by mouth 1 hour prior to dental procedures and cleanings.    *If you need a refill on your cardiac medications before your next appointment, please call your pharmacy*   Lab Work: Bmp, Mag, Tsh- Today   If you have labs (blood work) drawn today and your tests are completely normal, you will receive your results only by: MyChart Message (if you have MyChart) OR A paper copy in the mail If you have any lab test that is abnormal or we need to change your treatment, we will call you to review the results.   Testing/Procedures: Your physician has requested that you have an echocardiogram. Echocardiography is a painless test that uses sound waves to create images of your heart. It provides your doctor with information about the size and shape of your heart and how well your heart's chambers and valves are working. This procedure takes approximately one hour. There are no restrictions for this procedure. Please do NOT wear cologne, perfume, aftershave, or lotions (deodorant is allowed). Please arrive 15 minutes prior to your appointment time.    Follow-Up: At [facility], you and your health needs are our priority.  As part of our continuing mission to provide you with exceptional heart care, we have created designated Provider Care Teams.  These Care Teams include your primary Cardiologist (physician) and Advanced Practice Providers (APPs -  Physician Assistants and Nurse Practitioners) who all work together to provide you with the care you need, when you need it.  We recommend signing up for the patient portal called "MyChart".  Sign up information is provided on this After Visit Summary.  MyChart is used to connect with patients for Virtual Visits (Telemedicine).  Patients are able to view lab/test results, encounter notes, upcoming appointments, etc.  Non-urgent messages can be sent to your provider as well.   To  learn more about what you can do with MyChart, go to [email or web address].    Your next appointment:   6 month(s)  Provider:   Verne Carrow, MD  or Ronie Spies, [name]   Other Instructions Please check your medicine bottles to be sure you are taking bisoprolol 5mg  once daily. We did send in a refill if not - we would like you to be on this medicine.  Please ask your PCP if they would be able to add cholesterol to your labwork next month.

## 2022-11-06 ENCOUNTER — Encounter: Payer: Self-pay | Admitting: Cardiovascular Disease

## 2022-11-08 ENCOUNTER — Encounter (INDEPENDENT_AMBULATORY_CARE_PROVIDER_SITE_OTHER): Payer: Self-pay

## 2022-11-08 DIAGNOSIS — M1712 Unilateral primary osteoarthritis, left knee: Secondary | ICD-10-CM | POA: Diagnosis not present

## 2022-11-12 ENCOUNTER — Ambulatory Visit: Payer: Medicare Other | Admitting: *Deleted

## 2022-11-12 DIAGNOSIS — Z9889 Other specified postprocedural states: Secondary | ICD-10-CM

## 2022-11-12 DIAGNOSIS — I059 Rheumatic mitral valve disease, unspecified: Secondary | ICD-10-CM | POA: Diagnosis not present

## 2022-11-12 DIAGNOSIS — Z7901 Long term (current) use of anticoagulants: Secondary | ICD-10-CM

## 2022-11-12 DIAGNOSIS — G459 Transient cerebral ischemic attack, unspecified: Secondary | ICD-10-CM

## 2022-11-12 DIAGNOSIS — I6350 Cerebral infarction due to unspecified occlusion or stenosis of unspecified cerebral artery: Secondary | ICD-10-CM | POA: Diagnosis not present

## 2022-11-12 DIAGNOSIS — I480 Paroxysmal atrial fibrillation: Secondary | ICD-10-CM | POA: Diagnosis not present

## 2022-11-12 LAB — POCT INR: INR: 2.6 (ref 2.0–3.0)

## 2022-11-12 NOTE — Patient Instructions (Signed)
Description   Today take 2 tablets of warfarin then START taking Warfarin 1.5 tablets daily except 1 tablet on Thursday. Recheck in 2 weeks.  [facility] [phone]

## 2022-11-19 ENCOUNTER — Ambulatory Visit: Payer: Medicare Other

## 2022-11-27 ENCOUNTER — Ambulatory Visit: Payer: Medicare Other | Attending: Cardiology | Admitting: *Deleted

## 2022-11-27 DIAGNOSIS — Z7901 Long term (current) use of anticoagulants: Secondary | ICD-10-CM | POA: Diagnosis not present

## 2022-11-27 DIAGNOSIS — G459 Transient cerebral ischemic attack, unspecified: Secondary | ICD-10-CM

## 2022-11-27 DIAGNOSIS — I480 Paroxysmal atrial fibrillation: Secondary | ICD-10-CM

## 2022-11-27 DIAGNOSIS — I6350 Cerebral infarction due to unspecified occlusion or stenosis of unspecified cerebral artery: Secondary | ICD-10-CM

## 2022-11-27 DIAGNOSIS — I059 Rheumatic mitral valve disease, unspecified: Secondary | ICD-10-CM | POA: Diagnosis not present

## 2022-11-27 DIAGNOSIS — Z9889 Other specified postprocedural states: Secondary | ICD-10-CM

## 2022-11-27 LAB — POCT INR: INR: 3.7 — AB (ref 2.0–3.0)

## 2022-11-27 NOTE — Patient Instructions (Signed)
Description   Have something leafy green tomorrow then START taking Warfarin 1.5 tablets daily except 1 tablet on Thursday. Recheck in 2 weeks.  [facility] [phone]

## 2022-11-28 ENCOUNTER — Ambulatory Visit: Payer: Medicare Other | Admitting: Internal Medicine

## 2022-11-30 ENCOUNTER — Ambulatory Visit (HOSPITAL_COMMUNITY): Payer: Medicare Other | Attending: Cardiology

## 2022-11-30 DIAGNOSIS — I5022 Chronic systolic (congestive) heart failure: Secondary | ICD-10-CM | POA: Insufficient documentation

## 2022-11-30 LAB — ECHOCARDIOGRAM COMPLETE
Area-P 1/2: 3.03 cm2
MV VTI: 1.58 cm2
P 1/2 time: 426 ms
S' Lateral: 5.3 cm

## 2022-12-05 DIAGNOSIS — H40053 Ocular hypertension, bilateral: Secondary | ICD-10-CM | POA: Diagnosis not present

## 2022-12-05 DIAGNOSIS — H53432 Sector or arcuate defects, left eye: Secondary | ICD-10-CM | POA: Diagnosis not present

## 2022-12-06 ENCOUNTER — Telehealth: Payer: Self-pay | Admitting: Internal Medicine

## 2022-12-06 DIAGNOSIS — E785 Hyperlipidemia, unspecified: Secondary | ICD-10-CM

## 2022-12-06 DIAGNOSIS — E1159 Type 2 diabetes mellitus with other circulatory complications: Secondary | ICD-10-CM

## 2022-12-06 DIAGNOSIS — I10 Essential (primary) hypertension: Secondary | ICD-10-CM

## 2022-12-06 NOTE — Telephone Encounter (Signed)
Pt wants to know do he need labs done if so pt would love to come days before appt. Please update.

## 2022-12-10 ENCOUNTER — Ambulatory Visit: Payer: Medicare Other | Attending: Cardiology | Admitting: *Deleted

## 2022-12-10 DIAGNOSIS — Z7901 Long term (current) use of anticoagulants: Secondary | ICD-10-CM | POA: Diagnosis not present

## 2022-12-10 DIAGNOSIS — I059 Rheumatic mitral valve disease, unspecified: Secondary | ICD-10-CM | POA: Diagnosis not present

## 2022-12-10 DIAGNOSIS — I480 Paroxysmal atrial fibrillation: Secondary | ICD-10-CM | POA: Diagnosis not present

## 2022-12-10 DIAGNOSIS — G459 Transient cerebral ischemic attack, unspecified: Secondary | ICD-10-CM

## 2022-12-10 DIAGNOSIS — I6350 Cerebral infarction due to unspecified occlusion or stenosis of unspecified cerebral artery: Secondary | ICD-10-CM

## 2022-12-10 DIAGNOSIS — Z9889 Other specified postprocedural states: Secondary | ICD-10-CM | POA: Diagnosis not present

## 2022-12-10 LAB — POCT INR: INR: 3.9 — AB (ref 2.0–3.0)

## 2022-12-10 NOTE — Patient Instructions (Signed)
Description   Tomorrow take 1 tablet of warfarin and have something leafy green then START taking Warfarin 1.5 tablets daily except 1 tablet on Sunday and Thursday. Recheck in 2 weeks.  [facility] [phone]

## 2022-12-11 ENCOUNTER — Encounter: Payer: Self-pay | Admitting: Internal Medicine

## 2022-12-11 ENCOUNTER — Ambulatory Visit (INDEPENDENT_AMBULATORY_CARE_PROVIDER_SITE_OTHER): Payer: Medicare Other | Admitting: Internal Medicine

## 2022-12-11 VITALS — BP 118/60 | HR 75 | Temp 98.2°F | Ht 69.0 in | Wt 160.0 lb

## 2022-12-11 DIAGNOSIS — I480 Paroxysmal atrial fibrillation: Secondary | ICD-10-CM | POA: Diagnosis not present

## 2022-12-11 DIAGNOSIS — E785 Hyperlipidemia, unspecified: Secondary | ICD-10-CM

## 2022-12-11 DIAGNOSIS — E1159 Type 2 diabetes mellitus with other circulatory complications: Secondary | ICD-10-CM | POA: Diagnosis not present

## 2022-12-11 DIAGNOSIS — R7989 Other specified abnormal findings of blood chemistry: Secondary | ICD-10-CM

## 2022-12-11 DIAGNOSIS — K7460 Unspecified cirrhosis of liver: Secondary | ICD-10-CM

## 2022-12-11 DIAGNOSIS — E871 Hypo-osmolality and hyponatremia: Secondary | ICD-10-CM

## 2022-12-11 DIAGNOSIS — I10 Essential (primary) hypertension: Secondary | ICD-10-CM

## 2022-12-11 DIAGNOSIS — N183 Chronic kidney disease, stage 3 unspecified: Secondary | ICD-10-CM | POA: Diagnosis not present

## 2022-12-11 DIAGNOSIS — I5082 Biventricular heart failure: Secondary | ICD-10-CM

## 2022-12-11 DIAGNOSIS — I5033 Acute on chronic diastolic (congestive) heart failure: Secondary | ICD-10-CM

## 2022-12-11 NOTE — Assessment & Plan Note (Signed)
ECHO is better

## 2022-12-11 NOTE — Assessment & Plan Note (Signed)
On ASA, Lipitor

## 2022-12-11 NOTE — Assessment & Plan Note (Signed)
Repeat labs w/FT4

## 2022-12-11 NOTE — Assessment & Plan Note (Signed)
Dr Clifton Sam Chronic

## 2022-12-11 NOTE — Progress Notes (Signed)
Subjective:  Patient ID: Anthony Gould, male    DOB: December 14, 1933  Age: 89 y.o. MRN: [id number]  CC: Follow-up (2 mnth f/u)   HPI Anthony Gould presents for PAF, CHF, low Na 127, abn TSH  Outpatient Medications Prior to Visit  Medication Sig Dispense Refill   acetaminophen (TYLENOL) 500 MG tablet Take 500 mg by mouth every 6 (six) hours as needed for fever.     allopurinol (ZYLOPRIM) 100 MG tablet TAKE 1 TABLET(100 MG) BY MOUTH DAILY 90 tablet 3   amoxicillin (AMOXIL) 500 MG tablet Take 4 tablets by mouth 1 hour prior to dental procedures and cleanings. 12 tablet 6   aspirin 81 MG EC tablet Take 81 mg by mouth daily.     bisoprolol (ZEBETA) 5 MG tablet Take 1 tablet (5 mg total) by mouth daily. 90 tablet 3   Cholecalciferol (VITAMIN D3) 50 MCG (2000 UT) capsule TAKE 1 CAPSULE BY MOUTH EVERY DAY 100 capsule 3   dipyridamole (PERSANTINE) 50 MG tablet Take 1 tablet (50 mg total) by mouth 3 (three) times daily. 270 tablet 3   HYDROcodone-acetaminophen (NORCO) 7.5-325 MG tablet Take 0.5-1 tablets by mouth every 6 (six) hours as needed. 60 tablet 0   latanoprost (XALATAN) 0.005 % ophthalmic solution Place 1 drop into both eyes at bedtime.     loratadine (CLARITIN) 10 MG tablet Take 1 tablet (10 mg total) by mouth daily. 90 tablet 11   sacubitril-valsartan (ENTRESTO) 49-51 MG Take 1 tablet by mouth 2 (two) times daily. 90 tablet 3   torsemide (DEMADEX) 20 MG tablet Take 3 tablets (60 mg total) by mouth daily. 270 tablet 3   warfarin (COUMADIN) 5 MG tablet Take 1 to 1&1/2 tablets by mouth daily as directed by the Anticoagulation Clinic. 130 tablet 1   pantoprazole (PROTONIX) 40 MG tablet Take 1 tablet by mouth daily.     No facility-administered medications prior to visit.    ROS: Review of Systems  Constitutional:  Positive for fatigue. Negative for appetite change and unexpected weight change.  HENT:  Negative for congestion, nosebleeds, sneezing, sore throat and trouble swallowing.    Eyes:  Negative for itching and visual disturbance.  Respiratory:  Negative for cough and shortness of breath.   Cardiovascular:  Negative for chest pain, palpitations and leg swelling.  Gastrointestinal:  Negative for abdominal distention, blood in stool, diarrhea and nausea.  Genitourinary:  Negative for frequency and hematuria.  Musculoskeletal:  Negative for back pain, gait problem, joint swelling and neck pain.  Skin:  Negative for rash.  Neurological:  Negative for dizziness, tremors, speech difficulty and weakness.  Psychiatric/Behavioral:  Negative for agitation, dysphoric mood and sleep disturbance. The patient is not nervous/anxious.     Objective:  BP 118/60 (BP Location: Right Arm, Patient Position: Sitting, Cuff Size: Large)   Pulse 75   Temp 98.2 F (36.8 C) (Oral)   Ht 5\' 9"  (1.753 m)   Wt 160 lb (72.6 kg)   SpO2 96%   BMI 23.63 kg/m   BP Readings from Last 3 Encounters:  12/11/22 118/60  11/05/22 138/76  09/24/22 120/60    Wt Readings from Last 3 Encounters:  12/11/22 160 lb (72.6 kg)  11/05/22 160 lb 9.6 oz (72.8 kg)  09/24/22 159 lb (72.1 kg)    Physical Exam Constitutional:      General: He is not in acute distress.    Appearance: Normal appearance. He is well-developed.     Comments: NAD  Eyes:     Conjunctiva/sclera: Conjunctivae normal.     Pupils: Pupils are equal, round, and reactive to light.  Neck:     Thyroid: No thyromegaly.     Vascular: No JVD.  Cardiovascular:     Rate and Rhythm: Normal rate and regular rhythm.     Heart sounds: Normal heart sounds. No murmur heard.    No friction rub. No gallop.  Pulmonary:     Effort: Pulmonary effort is normal. No respiratory distress.     Breath sounds: Normal breath sounds. No wheezing or rales.  Chest:     Chest wall: No tenderness.  Abdominal:     General: Bowel sounds are normal. There is no distension.     Palpations: Abdomen is soft. There is no mass.     Tenderness: There is no  abdominal tenderness. There is no guarding or rebound.  Musculoskeletal:        General: No tenderness. Normal range of motion.     Cervical back: Normal range of motion.  Lymphadenopathy:     Cervical: No cervical adenopathy.  Skin:    General: Skin is warm and dry.     Findings: No rash.  Neurological:     Mental Status: He is alert and oriented to person, place, and time.     Cranial Nerves: No cranial nerve deficit.     Motor: No abnormal muscle tone.     Coordination: Coordination normal.     Gait: Gait normal.     Deep Tendon Reflexes: Reflexes are normal and symmetric.  Psychiatric:        Behavior: Behavior normal.        Thought Content: Thought content normal.        Judgment: Judgment normal.     Lab Results  Component Value Date   WBC 4.9 09/24/2022   HGB 12.1 (L) 09/24/2022   HCT 36.5 (L) 09/24/2022   PLT 202.0 09/24/2022   GLUCOSE 101 (H) 11/05/2022   CHOL 153 05/01/2021   TRIG 73 05/01/2021   HDL 66 05/01/2021   LDLCALC 73 05/01/2021   ALT 17 09/24/2022   AST 25 09/24/2022   NA 127 (L) 11/05/2022   K 4.8 11/05/2022   CL 91 (L) 11/05/2022   CREATININE 1.42 (H) 11/05/2022   BUN 38 (H) 11/05/2022   CO2 23 11/05/2022   TSH 11.500 (H) 11/05/2022   PSA 0.87 10/20/2014   INR 3.9 (A) 12/10/2022   HGBA1C 5.7 07/19/2022    ECHOCARDIOGRAM COMPLETE  Result Date: 02/27/2021    ECHOCARDIOGRAM REPORT   Patient Name:   Anthony Gould Date of Exam: 02/27/2021 Medical Rec #:  [id number]         Height:       69.0 in Accession #:    [id number]        Weight:       167.6 lb Date of Birth:  [birth year]         BSA:          1.917 m Patient Age:    87 years          BP:           124/56 mmHg Patient Gender: M                 HR:           69 bpm. Exam Location:  Inpatient Procedure: 2D Echo, 3D Echo, Cardiac Doppler and Color Doppler Indications:  I50.40* Unspecified combined systolic (congestive) and                  diastolic (congestive) heart failure  History:          Patient has prior history of Echocardiogram examinations, most                  recent 03/09/2019. CHF, Abnormal ECG, Pulmonary HTN and TIA,                  Mitral Valve Disease, Arrythmias:Atrial Fibrillation and NSVT,                  Signs/Symptoms:Chest Pain and Syncope; Risk                  Factors:Hypertension and Diabetes. Mechanical mitral valve.                   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 Procedure Date: 44.  Sonographer:     Sheralyn Boatman RDCS Referring Phys:  [id number] [facility] Diagnosing Phys: Epifanio Lesches MD  Sonographer Comments: Technically difficult study due to poor echo windows. IMPRESSIONS  1. Left ventricular ejection fraction, by estimation, is 40 to 45%. The left ventricle has mildly decreased function. The left ventricle demonstrates regional wall motion abnormalities (see scoring diagram/findings for description). Septal hypokinesis. There is moderate left ventricular hypertrophy. Left ventricular diastolic parameters are indeterminate.  2. Right ventricular systolic function is moderately reduced. The right ventricular size is moderately enlarged. There is moderately elevated pulmonary artery systolic pressure. The estimated right ventricular systolic pressure is 53.2 mmHg.  3. Left atrial size was severely dilated.  4. Right atrial size was severely dilated.  5. 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 Procedure Date: 42. Trivial mitral valve regurgitation.     MG at 63bpm, EOA 2.4 cm^2  6. The tricuspid valve is abnormal. Tricuspid valve regurgitation is severe.  7. The aortic valve is tricuspid. Aortic valve regurgitation is trivial. Aortic valve sclerosis is present, with no evidence of aortic valve stenosis.  8. The inferior vena cava is dilated in size with <50% respiratory variability, suggesting right atrial pressure of 15 mmHg. FINDINGS  Left  Ventricle: Left ventricular ejection fraction, by estimation, is 40 to 45%. The left ventricle has mildly decreased function. The left ventricle demonstrates regional wall motion abnormalities. The left ventricular internal cavity size was normal in size. There is moderate left ventricular hypertrophy. Left ventricular diastolic parameters are indeterminate.  LV Wall Scoring: The entire septum is hypokinetic. The entire anterior wall, entire lateral wall, entire inferior wall, and apex are normal. Right Ventricle: The right ventricular size is moderately enlarged. Right vetricular wall thickness was not well visualized. Right ventricular systolic function is moderately reduced. There is moderately elevated pulmonary artery systolic pressure. The tricuspid regurgitant velocity is 3.09 m/s, and with an assumed right atrial pressure of 15 mmHg, the estimated right ventricular systolic pressure is 53.2 mmHg. Left Atrium: Left atrial size was severely dilated. Right Atrium: Right atrial size was severely dilated. Pericardium: There is no evidence of pericardial effusion. Mitral Valve: The mitral valve has been repaired/replaced. Trivial mitral valve regurgitation. There is a unknown size Medtronic tilting disc valve present in the mitral position. Procedure Date: 9. MV peak gradient, 13.5  mmHg. The mean mitral valve gradient is 4.0 mmHg. Tricuspid Valve: The tricuspid valve is abnormal. Tricuspid valve regurgitation is severe. Aortic Valve: The aortic valve is tricuspid. Aortic valve regurgitation is trivial. Aortic valve sclerosis is present, with no evidence of aortic valve stenosis. Pulmonic Valve: The pulmonic valve was not well visualized. Pulmonic valve regurgitation is trivial. Aorta: The aortic root and ascending aorta are structurally normal, with no evidence of dilitation. Venous: The inferior vena cava is dilated in size with less than 50% respiratory variability, suggesting right atrial pressure of 15  mmHg. IAS/Shunts: The interatrial septum was not well visualized.  LEFT VENTRICLE PLAX 2D LVIDd:         5.10 cm      Diastology LVIDs:         4.00 cm      LV e' medial:    7.40 cm/s LV PW:         2.00 cm      LV E/e' medial:  22.2 LV IVS:        0.80 cm      LV e' lateral:   7.29 cm/s LVOT diam:     2.40 cm      LV E/e' lateral: 22.5 LV SV:         97 LV SV Index:   51 LVOT Area:     4.52 cm  LV Volumes (MOD) LV vol d, MOD A2C: 135.0 ml LV vol d, MOD A4C: 105.0 ml LV vol s, MOD A2C: 69.9 ml LV vol s, MOD A4C: 59.0 ml LV SV MOD A2C:     65.1 ml LV SV MOD A4C:     105.0 ml LV SV MOD BP:      53.6 ml RIGHT VENTRICLE            IVC RV S prime:     8.33 cm/s  IVC diam: 2.70 cm TAPSE (M-mode): 1.4 cm LEFT ATRIUM            Index        RIGHT ATRIUM           Index LA diam:      5.80 cm  3.03 cm/m   RA Area:     39.60 cm LA Vol (A2C): 72.1 ml  37.62 ml/m  RA Volume:   166.00 ml 86.62 ml/m LA Vol (A4C): 140.0 ml 73.05 ml/m  AORTIC VALVE             PULMONIC VALVE LVOT Vmax:   104.00 cm/s PR End Diast Vel: 2.22 msec LVOT Vmean:  67.300 cm/s LVOT VTI:    0.215 m  AORTA Ao Root diam: 3.60 cm Ao Asc diam:  3.50 cm MITRAL VALVE                TRICUSPID VALVE MV Area (PHT): 3.48 cm     TR Peak grad:   38.2 mmHg MV Area VTI:   2.61 cm     TR Vmax:        309.00 cm/s MV Peak grad:  13.5 mmHg MV Mean grad:  4.0 mmHg     SHUNTS MV Vmax:       1.84 m/s     Systemic VTI:  0.22 m MV Vmean:      88.9 cm/s    Systemic Diam: 2.40 cm MV Decel Time: 218 msec MV E velocity: 164.00 cm/s Epifanio Lesches MD Electronically signed by Epifanio Lesches MD Signature Date/Time: 02/27/2021/6:41:19 PM  Final (Updated)     Assessment & Plan:   Problem List Items Addressed This Visit     DM2 (diabetes mellitus, type 2) (HCC) - Primary    On ASA, Lipitor      Essential hypertension    SBP 130-140 at home - OK per Dr Shirlee Latch Cont w/Bisoprolol, Diltiazem d/c, ASA, Coumadin, Norvasc      PAF (paroxysmal atrial fibrillation)  (HCC)    Dr Clifton Tyion Chronic       Relevant Orders   T4, free   Cirrhosis of liver without ascites (HCC)    Check LFTs No edema      CRI (chronic renal insufficiency), stage 3 (moderate) (HCC)    Cont w/Entresto 49/51 Hydrate well      Hyponatremia    Recurrent ?etiology Worse Fluids are not exessive: 2-3 bottles a day plus meals Repeat labs      Acute on chronic diastolic CHF (congestive heart failure) (HCC)    ECHO is better      Biventricular heart failure (HCC)    ECHO is better      Abnormal TSH    Repeat labs w/FT4         No orders of the defined types were placed in this encounter.     Follow-up: No follow-ups on file.  Sonda Primes, MD

## 2022-12-11 NOTE — Assessment & Plan Note (Signed)
Cont w/Entresto 49/51 Hydrate well

## 2022-12-11 NOTE — Assessment & Plan Note (Signed)
Check LFTs No edema

## 2022-12-11 NOTE — Assessment & Plan Note (Signed)
SBP 130-140 at home - OK per Dr Shirlee Latch Cont w/Bisoprolol, Diltiazem d/c, ASA, Coumadin, Norvasc

## 2022-12-11 NOTE — Assessment & Plan Note (Signed)
Recurrent ?etiology Worse Fluids are not exessive: 2-3 bottles a day plus meals Repeat labs

## 2022-12-12 LAB — COMPREHENSIVE METABOLIC PANEL WITH GFR
ALT: 44 U/L (ref 0–53)
AST: 52 U/L — ABNORMAL HIGH (ref 0–37)
Albumin: 4.2 g/dL (ref 3.5–5.2)
Alkaline Phosphatase: 107 U/L (ref 39–117)
BUN: 58 mg/dL — ABNORMAL HIGH (ref 6–23)
CO2: 28 meq/L (ref 19–32)
Calcium: 9 mg/dL (ref 8.4–10.5)
Chloride: 97 meq/L (ref 96–112)
Creatinine, Ser: 1.55 mg/dL — ABNORMAL HIGH (ref 0.40–1.50)
GFR: 40.02 mL/min — ABNORMAL LOW (ref >60.00)
Glucose, Bld: 101 mg/dL — ABNORMAL HIGH (ref 70–99)
Potassium: 4.1 meq/L (ref 3.5–5.1)
Sodium: 133 meq/L — ABNORMAL LOW (ref 135–145)
Total Bilirubin: 0.9 mg/dL (ref 0.2–1.2)
Total Protein: 7 g/dL (ref 6.0–8.3)

## 2022-12-12 LAB — CBC WITH DIFFERENTIAL/PLATELET
Basophils Absolute: 0 10*3/uL (ref 0.0–0.1)
Basophils Relative: 1 % (ref 0.0–3.0)
Eosinophils Absolute: 0.7 10*3/uL (ref 0.0–0.7)
Eosinophils Relative: 16.8 % — ABNORMAL HIGH (ref 0.0–5.0)
HCT: 35.4 % — ABNORMAL LOW (ref 39.0–52.0)
Hemoglobin: 11.8 g/dL — ABNORMAL LOW (ref 13.0–17.0)
Lymphocytes Relative: 23.4 % (ref 12.0–46.0)
Lymphs Abs: 1 10*3/uL (ref 0.7–4.0)
MCHC: 33.3 g/dL (ref 30.0–36.0)
MCV: 97.6 fl (ref 78.0–100.0)
Monocytes Absolute: 0.4 10*3/uL (ref 0.1–1.0)
Monocytes Relative: 9.7 % (ref 3.0–12.0)
Neutro Abs: 2.1 10*3/uL (ref 1.4–7.7)
Neutrophils Relative %: 49.1 % (ref 43.0–77.0)
Platelets: 148 10*3/uL — ABNORMAL LOW (ref 150.0–400.0)
RBC: 3.62 Mil/uL — ABNORMAL LOW (ref 4.22–5.81)
RDW: 15.5 % (ref 11.5–15.5)
WBC: 4.2 10*3/uL (ref 4.0–10.5)

## 2022-12-12 LAB — HEMOGLOBIN A1C: Hgb A1c MFr Bld: 6 % (ref 4.6–6.5)

## 2022-12-12 LAB — T4, FREE: Free T4: 1 ng/dL (ref 0.60–1.60)

## 2022-12-12 LAB — TSH: TSH: 13.22 u[IU]/mL — ABNORMAL HIGH (ref 0.35–5.50)

## 2022-12-24 ENCOUNTER — Ambulatory Visit: Payer: Medicare Other | Attending: Internal Medicine

## 2022-12-24 DIAGNOSIS — G459 Transient cerebral ischemic attack, unspecified: Secondary | ICD-10-CM

## 2022-12-24 DIAGNOSIS — Z7901 Long term (current) use of anticoagulants: Secondary | ICD-10-CM | POA: Diagnosis not present

## 2022-12-24 DIAGNOSIS — I059 Rheumatic mitral valve disease, unspecified: Secondary | ICD-10-CM

## 2022-12-24 DIAGNOSIS — I6350 Cerebral infarction due to unspecified occlusion or stenosis of unspecified cerebral artery: Secondary | ICD-10-CM | POA: Diagnosis not present

## 2022-12-24 DIAGNOSIS — I480 Paroxysmal atrial fibrillation: Secondary | ICD-10-CM | POA: Diagnosis not present

## 2022-12-24 DIAGNOSIS — Z9889 Other specified postprocedural states: Secondary | ICD-10-CM

## 2022-12-24 LAB — POCT INR: INR: 2.8 (ref 2.0–3.0)

## 2022-12-24 NOTE — Patient Instructions (Signed)
TAKE ANOTHER 0.5 TABLET TODAY THEN CONTINUE taking Warfarin 1.5 tablets daily except 1 tablet on Sunday and Thursday. Recheck in 3 weeks.  [facility] [phone]

## 2023-01-14 ENCOUNTER — Ambulatory Visit: Payer: Medicare Other | Attending: Cardiovascular Disease | Admitting: *Deleted

## 2023-01-14 DIAGNOSIS — I059 Rheumatic mitral valve disease, unspecified: Secondary | ICD-10-CM | POA: Diagnosis not present

## 2023-01-14 DIAGNOSIS — I480 Paroxysmal atrial fibrillation: Secondary | ICD-10-CM

## 2023-01-14 DIAGNOSIS — G459 Transient cerebral ischemic attack, unspecified: Secondary | ICD-10-CM | POA: Diagnosis not present

## 2023-01-14 DIAGNOSIS — Z7901 Long term (current) use of anticoagulants: Secondary | ICD-10-CM

## 2023-01-14 DIAGNOSIS — I6350 Cerebral infarction due to unspecified occlusion or stenosis of unspecified cerebral artery: Secondary | ICD-10-CM

## 2023-01-14 DIAGNOSIS — Z9889 Other specified postprocedural states: Secondary | ICD-10-CM

## 2023-01-14 LAB — POCT INR: POC INR: 3.2

## 2023-01-14 NOTE — Patient Instructions (Signed)
Description   CONTINUE taking Warfarin 1.5 tablets daily except 1 tablet on Sunday and Thursday. Recheck in 4 weeks.  [facility] [phone]

## 2023-01-21 ENCOUNTER — Ambulatory Visit (INDEPENDENT_AMBULATORY_CARE_PROVIDER_SITE_OTHER): Payer: Medicare Other

## 2023-01-21 DIAGNOSIS — Z23 Encounter for immunization: Secondary | ICD-10-CM | POA: Diagnosis not present

## 2023-01-21 NOTE — Progress Notes (Cosign Needed)
Pt received HD flu w/o complications

## 2023-02-11 ENCOUNTER — Ambulatory Visit: Payer: Medicare Other | Attending: Cardiology

## 2023-02-11 DIAGNOSIS — Z7901 Long term (current) use of anticoagulants: Secondary | ICD-10-CM | POA: Diagnosis not present

## 2023-02-11 DIAGNOSIS — I059 Rheumatic mitral valve disease, unspecified: Secondary | ICD-10-CM | POA: Diagnosis not present

## 2023-02-11 DIAGNOSIS — G459 Transient cerebral ischemic attack, unspecified: Secondary | ICD-10-CM | POA: Diagnosis not present

## 2023-02-11 DIAGNOSIS — I480 Paroxysmal atrial fibrillation: Secondary | ICD-10-CM | POA: Diagnosis not present

## 2023-02-11 DIAGNOSIS — I6350 Cerebral infarction due to unspecified occlusion or stenosis of unspecified cerebral artery: Secondary | ICD-10-CM | POA: Diagnosis not present

## 2023-02-11 DIAGNOSIS — Z9889 Other specified postprocedural states: Secondary | ICD-10-CM

## 2023-02-11 LAB — POCT INR: INR: 3.1 — AB (ref 2.0–3.0)

## 2023-02-11 NOTE — Patient Instructions (Signed)
CONTINUE taking Warfarin 1.5 tablets daily except 1 tablet on Sunday and Thursday. Recheck in 4 weeks.  [facility] [phone]

## 2023-02-18 ENCOUNTER — Other Ambulatory Visit: Payer: Self-pay | Admitting: Internal Medicine

## 2023-02-19 ENCOUNTER — Other Ambulatory Visit: Payer: Self-pay | Admitting: Internal Medicine

## 2023-03-11 ENCOUNTER — Ambulatory Visit: Payer: Medicare Other | Attending: Cardiovascular Disease

## 2023-03-11 DIAGNOSIS — I480 Paroxysmal atrial fibrillation: Secondary | ICD-10-CM

## 2023-03-11 DIAGNOSIS — G459 Transient cerebral ischemic attack, unspecified: Secondary | ICD-10-CM

## 2023-03-11 DIAGNOSIS — Z7901 Long term (current) use of anticoagulants: Secondary | ICD-10-CM

## 2023-03-11 DIAGNOSIS — I059 Rheumatic mitral valve disease, unspecified: Secondary | ICD-10-CM

## 2023-03-11 DIAGNOSIS — Z9889 Other specified postprocedural states: Secondary | ICD-10-CM

## 2023-03-11 DIAGNOSIS — I6350 Cerebral infarction due to unspecified occlusion or stenosis of unspecified cerebral artery: Secondary | ICD-10-CM

## 2023-03-11 LAB — POCT INR: INR: 4.6 — AB (ref 2.0–3.0)

## 2023-03-11 NOTE — Patient Instructions (Signed)
HOLD TOMORROW ONLY THEN CONTINUE taking Warfarin 1.5 tablets daily except 1 tablet on Sunday and Thursday. Recheck in 2 weeks.  [facility] [phone]

## 2023-03-12 ENCOUNTER — Ambulatory Visit: Payer: Medicare Other | Admitting: Internal Medicine

## 2023-03-12 ENCOUNTER — Encounter: Payer: Self-pay | Admitting: Internal Medicine

## 2023-03-12 VITALS — BP 110/60 | HR 70 | Temp 98.6°F | Ht 69.0 in | Wt 163.0 lb

## 2023-03-12 DIAGNOSIS — E1159 Type 2 diabetes mellitus with other circulatory complications: Secondary | ICD-10-CM

## 2023-03-12 DIAGNOSIS — I10 Essential (primary) hypertension: Secondary | ICD-10-CM

## 2023-03-12 DIAGNOSIS — I480 Paroxysmal atrial fibrillation: Secondary | ICD-10-CM

## 2023-03-12 DIAGNOSIS — E785 Hyperlipidemia, unspecified: Secondary | ICD-10-CM | POA: Diagnosis not present

## 2023-03-12 DIAGNOSIS — D500 Iron deficiency anemia secondary to blood loss (chronic): Secondary | ICD-10-CM

## 2023-03-12 MED ORDER — HYDROCODONE-ACETAMINOPHEN 7.5-325 MG PO TABS: 0.5000 | ORAL_TABLET | Freq: Four times a day (QID) | ORAL | 0 refills | Status: DC | PRN

## 2023-03-12 NOTE — Assessment & Plan Note (Addendum)
Dr Clifton Windel Chronic  Cont w/Bisoprolol, Coumadin

## 2023-03-12 NOTE — Assessment & Plan Note (Signed)
SBP 130-140 at home - OK per Dr Shirlee Latch Cont w/Bisoprolol, Diltiazem d/c, ASA, Coumadin, Norvasc

## 2023-03-12 NOTE — Assessment & Plan Note (Signed)
Continue with core 

## 2023-03-12 NOTE — Assessment & Plan Note (Signed)
On ASA, Lipitor 

## 2023-03-12 NOTE — Progress Notes (Signed)
Subjective:  Patient ID: Anthony Gould, male    DOB: [birth year]  Age: 89 y.o. MRN: [id number]  CC: Medical Management of Chronic Issues (3 mnth f/u)   HPI [name] presents for HTN, OA, PAF  Outpatient Medications Prior to Visit  Medication Sig Dispense Refill   allopurinol (ZYLOPRIM) 100 MG tablet TAKE 1 TABLET(100 MG) BY MOUTH DAILY 90 tablet 3   aspirin 81 MG EC tablet Take 81 mg by mouth daily.     bisoprolol (ZEBETA) 5 MG tablet Take 1 tablet (5 mg total) by mouth daily. 90 tablet 3   Cholecalciferol (VITAMIN D3) 50 MCG (2000 UT) capsule TAKE 1 CAPSULE BY MOUTH EVERY DAY 100 capsule 3   dipyridamole (PERSANTINE) 50 MG tablet Take 1 tablet (50 mg total) by mouth 3 (three) times daily. 270 tablet 3   latanoprost (XALATAN) 0.005 % ophthalmic solution Place 1 drop into both eyes at bedtime.     loratadine (CLARITIN) 10 MG tablet Take 1 tablet (10 mg total) by mouth daily. 90 tablet 11   sacubitril-valsartan (ENTRESTO) 49-51 MG Take 1 tablet by mouth 2 (two) times daily. 90 tablet 3   torsemide (DEMADEX) 20 MG tablet Take 3 tablets (60 mg total) by mouth daily. 270 tablet 3   warfarin (COUMADIN) 5 MG tablet Take 1 to 1&1/2 tablets by mouth daily as directed by the Anticoagulation Clinic. 130 tablet 1   acetaminophen (TYLENOL) 500 MG tablet Take 500 mg by mouth every 6 (six) hours as needed for fever.     HYDROcodone-acetaminophen (NORCO) 7.5-325 MG tablet Take 0.5-1 tablets by mouth every 6 (six) hours as needed. 60 tablet 0   amoxicillin (AMOXIL) 500 MG tablet Take 4 tablets by mouth 1 hour prior to dental procedures and cleanings. (Patient not taking: Reported on 03/12/2023) 12 tablet 6   No facility-administered medications prior to visit.    ROS: Review of Systems  Constitutional:  Negative for appetite change, fatigue and unexpected weight change.  HENT:  Negative for congestion, nosebleeds, sneezing, sore throat and trouble swallowing.   Eyes:  Negative for  itching and visual disturbance.  Respiratory:  Negative for cough.   Cardiovascular:  Negative for chest pain, palpitations and leg swelling.  Gastrointestinal:  Negative for abdominal distention, blood in stool, diarrhea and nausea.  Genitourinary:  Negative for frequency and hematuria.  Musculoskeletal:  Positive for arthralgias and gait problem. Negative for back pain, joint swelling and neck pain.  Skin:  Negative for rash.  Neurological:  Negative for dizziness, tremors, speech difficulty and weakness.  Psychiatric/Behavioral:  Negative for agitation, dysphoric mood and sleep disturbance. The patient is not nervous/anxious.     Objective:  BP 110/60 (BP Location: Right Arm, Patient Position: Sitting, Cuff Size: Normal)   Pulse 70   Temp 98.6 F (37 C) (Oral)   Ht 5\' 9"  (1.753 m)   Wt 163 lb (73.9 kg)   SpO2 97%   BMI 24.07 kg/m   BP Readings from Last 3 Encounters:  03/12/23 110/60  12/11/22 118/60  11/05/22 138/76    Wt Readings from Last 3 Encounters:  03/12/23 163 lb (73.9 kg)  12/11/22 160 lb (72.6 kg)  11/05/22 160 lb 9.6 oz (72.8 kg)    Physical Exam Constitutional:      General: He is not in acute distress.    Appearance: Normal appearance. He is well-developed.     Comments: NAD  Eyes:     Conjunctiva/sclera: Conjunctivae normal.  Pupils: Pupils are equal, round, and reactive to light.  Neck:     Thyroid: No thyromegaly.     Vascular: No JVD.  Cardiovascular:     Rate and Rhythm: Normal rate and regular rhythm.     Heart sounds: Normal heart sounds. No murmur heard.    No friction rub. No gallop.  Pulmonary:     Effort: Pulmonary effort is normal. No respiratory distress.     Breath sounds: Normal breath sounds. No wheezing or rales.  Chest:     Chest wall: No tenderness.  Abdominal:     General: Bowel sounds are normal. There is no distension.     Palpations: Abdomen is soft. There is no mass.     Tenderness: There is no abdominal tenderness.  There is no guarding or rebound.  Musculoskeletal:        General: Tenderness present. Normal range of motion.     Cervical back: Normal range of motion.  Lymphadenopathy:     Cervical: No cervical adenopathy.  Skin:    General: Skin is warm and dry.     Findings: No rash.  Neurological:     Mental Status: He is alert and oriented to person, place, and time.     Cranial Nerves: No cranial nerve deficit.     Motor: No abnormal muscle tone.     Coordination: Coordination normal.     Gait: Gait normal.     Deep Tendon Reflexes: Reflexes are normal and symmetric.  Psychiatric:        Behavior: Behavior normal.        Thought Content: Thought content normal.        Judgment: Judgment normal.   Knees w/pain, using a cane  Lab Results  Component Value Date   WBC 4.2 12/12/2022   HGB 11.8 (L) 12/12/2022   HCT 35.4 (L) 12/12/2022   PLT 148.0 (L) 12/12/2022   GLUCOSE 101 (H) 12/12/2022   CHOL 153 05/01/2021   TRIG 73 05/01/2021   HDL 66 05/01/2021   LDLCALC 73 05/01/2021   ALT 44 12/12/2022   AST 52 (H) 12/12/2022   NA 133 (L) 12/12/2022   K 4.1 12/12/2022   CL 97 12/12/2022   CREATININE 1.55 (H) 12/12/2022   BUN 58 (H) 12/12/2022   CO2 28 12/12/2022   TSH 13.22 (H) 12/12/2022   PSA 0.87 10/20/2014   INR 4.6 (A) 03/11/2023   HGBA1C 6.0 12/12/2022    ECHOCARDIOGRAM COMPLETE Result Date: 02/27/2021    ECHOCARDIOGRAM REPORT   Patient Name:   Anthony Gould Date of Exam: 02/27/2021 Medical Rec #:  [id number]         Height:       69.0 in Accession #:    [id number]        Weight:       167.6 lb Date of Birth:  Oct 24, 1933         BSA:          1.917 m Patient Age:    87 years          BP:           124/56 mmHg Patient Gender: M                 HR:           69 bpm. Exam Location:  Inpatient Procedure: 2D Echo, 3D Echo, Cardiac Doppler and Color Doppler Indications:     I50.40* Unspecified combined systolic (congestive)  and                  diastolic (congestive) heart failure   History:         Patient has prior history of Echocardiogram examinations, most                  recent 03/09/2019. CHF, Abnormal ECG, Pulmonary HTN and TIA,                  Mitral Valve Disease, Arrythmias:Atrial Fibrillation and NSVT,                  Signs/Symptoms:Chest Pain and Syncope; Risk                  Factors:Hypertension and Diabetes. Mechanical mitral valve.                   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 Procedure Date: 12.  Sonographer:     Sheralyn Boatman RDCS Referring Phys:  [id number] [facility] Diagnosing Phys: Epifanio Lesches MD  Sonographer Comments: Technically difficult study due to poor echo windows. IMPRESSIONS  1. Left ventricular ejection fraction, by estimation, is 40 to 45%. The left ventricle has mildly decreased function. The left ventricle demonstrates regional wall motion abnormalities (see scoring diagram/findings for description). Septal hypokinesis. There is moderate left ventricular hypertrophy. Left ventricular diastolic parameters are indeterminate.  2. Right ventricular systolic function is moderately reduced. The right ventricular size is moderately enlarged. There is moderately elevated pulmonary artery systolic pressure. The estimated right ventricular systolic pressure is 53.2 mmHg.  3. Left atrial size was severely dilated.  4. Right atrial size was severely dilated.  5. 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 Procedure Date: 68. Trivial mitral valve regurgitation.     MG at 63bpm, EOA 2.4 cm^2  6. The tricuspid valve is abnormal. Tricuspid valve regurgitation is severe.  7. The aortic valve is tricuspid. Aortic valve regurgitation is trivial. Aortic valve sclerosis is present, with no evidence of aortic valve stenosis.  8. The inferior vena cava is dilated in size with <50% respiratory variability, suggesting right atrial pressure of 15 mmHg.  FINDINGS  Left Ventricle: Left ventricular ejection fraction, by estimation, is 40 to 45%. The left ventricle has mildly decreased function. The left ventricle demonstrates regional wall motion abnormalities. The left ventricular internal cavity size was normal in size. There is moderate left ventricular hypertrophy. Left ventricular diastolic parameters are indeterminate.  LV Wall Scoring: The entire septum is hypokinetic. The entire anterior wall, entire lateral wall, entire inferior wall, and apex are normal. Right Ventricle: The right ventricular size is moderately enlarged. Right vetricular wall thickness was not well visualized. Right ventricular systolic function is moderately reduced. There is moderately elevated pulmonary artery systolic pressure. The tricuspid regurgitant velocity is 3.09 m/s, and with an assumed right atrial pressure of 15 mmHg, the estimated right ventricular systolic pressure is 53.2 mmHg. Left Atrium: Left atrial size was severely dilated. Right Atrium: Right atrial size was severely dilated. Pericardium: There is no evidence of pericardial effusion. Mitral Valve: The mitral valve has been repaired/replaced. Trivial mitral valve regurgitation. There is a unknown size Medtronic tilting disc valve present in the mitral position. Procedure Date: 17. MV peak gradient, 13.5 mmHg. The mean mitral valve  gradient is 4.0 mmHg. Tricuspid Valve: The tricuspid valve is abnormal. Tricuspid valve regurgitation is severe. Aortic Valve: The aortic valve is tricuspid. Aortic valve regurgitation is trivial. Aortic valve sclerosis is present, with no evidence of aortic valve stenosis. Pulmonic Valve: The pulmonic valve was not well visualized. Pulmonic valve regurgitation is trivial. Aorta: The aortic root and ascending aorta are structurally normal, with no evidence of dilitation. Venous: The inferior vena cava is dilated in size with less than 50% respiratory variability, suggesting right atrial  pressure of 15 mmHg. IAS/Shunts: The interatrial septum was not well visualized.  LEFT VENTRICLE PLAX 2D LVIDd:         5.10 cm      Diastology LVIDs:         4.00 cm      LV e' medial:    7.40 cm/s LV PW:         2.00 cm      LV E/e' medial:  22.2 LV IVS:        0.80 cm      LV e' lateral:   7.29 cm/s LVOT diam:     2.40 cm      LV E/e' lateral: 22.5 LV SV:         97 LV SV Index:   51 LVOT Area:     4.52 cm  LV Volumes (MOD) LV vol d, MOD A2C: 135.0 ml LV vol d, MOD A4C: 105.0 ml LV vol s, MOD A2C: 69.9 ml LV vol s, MOD A4C: 59.0 ml LV SV MOD A2C:     65.1 ml LV SV MOD A4C:     105.0 ml LV SV MOD BP:      53.6 ml RIGHT VENTRICLE            IVC RV S prime:     8.33 cm/s  IVC diam: 2.70 cm TAPSE (M-mode): 1.4 cm LEFT ATRIUM            Index        RIGHT ATRIUM           Index LA diam:      5.80 cm  3.03 cm/m   RA Area:     39.60 cm LA Vol (A2C): 72.1 ml  37.62 ml/m  RA Volume:   166.00 ml 86.62 ml/m LA Vol (A4C): 140.0 ml 73.05 ml/m  AORTIC VALVE             PULMONIC VALVE LVOT Vmax:   104.00 cm/s PR End Diast Vel: 2.22 msec LVOT Vmean:  67.300 cm/s LVOT VTI:    0.215 m  AORTA Ao Root diam: 3.60 cm Ao Asc diam:  3.50 cm MITRAL VALVE                TRICUSPID VALVE MV Area (PHT): 3.48 cm     TR Peak grad:   38.2 mmHg MV Area VTI:   2.61 cm     TR Vmax:        309.00 cm/s MV Peak grad:  13.5 mmHg MV Mean grad:  4.0 mmHg     SHUNTS MV Vmax:       1.84 m/s     Systemic VTI:  0.22 m MV Vmean:      88.9 cm/s    Systemic Diam: 2.40 cm MV Decel Time: 218 msec MV E velocity: 164.00 cm/s Epifanio Lesches MD Electronically signed by Epifanio Lesches MD Signature Date/Time: 02/27/2021/6:41:19 PM    Final (Updated)  Assessment & Plan:   Problem List Items Addressed This Visit     DM2 (diabetes mellitus, type 2) (HCC) - Primary   On ASA, Lipitor      Relevant Orders   Comprehensive metabolic panel   CBC with Differential/Platelet   Iron, TIBC and Ferritin Panel   Hemoglobin A1c   TSH   T4, free    Lipid panel   Hyperlipidemia   Continue with Lipitor      Relevant Orders   Comprehensive metabolic panel   CBC with Differential/Platelet   Iron, TIBC and Ferritin Panel   Hemoglobin A1c   TSH   T4, free   Lipid panel   Essential hypertension   SBP 130-140 at home - OK per Dr Shirlee Latch Cont w/Bisoprolol, Diltiazem d/c, ASA, Coumadin, Norvasc      Relevant Orders   Comprehensive metabolic panel   CBC with Differential/Platelet   Iron, TIBC and Ferritin Panel   Hemoglobin A1c   TSH   T4, free   Lipid panel   PAF (paroxysmal atrial fibrillation) (HCC)   Dr Clifton Sovereign Chronic       Relevant Orders   Comprehensive metabolic panel   CBC with Differential/Platelet   Iron, TIBC and Ferritin Panel   Hemoglobin A1c   TSH   T4, free   Lipid panel   Iron deficiency anemia   Relevant Orders   CBC with Differential/Platelet   Iron, TIBC and Ferritin Panel      Meds ordered this encounter  Medications   HYDROcodone-acetaminophen (NORCO) 7.5-325 MG tablet    Sig: Take 0.5-1 tablets by mouth every 6 (six) hours as needed for severe pain (pain score 7-10).    Dispense:  60 tablet    Refill:  0      Follow-up: No follow-ups on file.  Sonda Primes, MD

## 2023-03-13 ENCOUNTER — Other Ambulatory Visit (INDEPENDENT_AMBULATORY_CARE_PROVIDER_SITE_OTHER): Payer: Medicare Other

## 2023-03-13 DIAGNOSIS — E1159 Type 2 diabetes mellitus with other circulatory complications: Secondary | ICD-10-CM

## 2023-03-13 DIAGNOSIS — E785 Hyperlipidemia, unspecified: Secondary | ICD-10-CM | POA: Diagnosis not present

## 2023-03-13 DIAGNOSIS — D500 Iron deficiency anemia secondary to blood loss (chronic): Secondary | ICD-10-CM | POA: Diagnosis not present

## 2023-03-13 DIAGNOSIS — I10 Essential (primary) hypertension: Secondary | ICD-10-CM

## 2023-03-13 DIAGNOSIS — I480 Paroxysmal atrial fibrillation: Secondary | ICD-10-CM | POA: Diagnosis not present

## 2023-03-13 LAB — CBC WITH DIFFERENTIAL/PLATELET
Basophils Absolute: 0 10*3/uL (ref 0.0–0.1)
Basophils Relative: 1.2 % (ref 0.0–3.0)
Eosinophils Absolute: 0.6 10*3/uL (ref 0.0–0.7)
Eosinophils Relative: 15.7 % — ABNORMAL HIGH (ref 0.0–5.0)
HCT: 34.6 % — ABNORMAL LOW (ref 39.0–52.0)
Hemoglobin: 11.7 g/dL — ABNORMAL LOW (ref 13.0–17.0)
Lymphocytes Relative: 24.3 % (ref 12.0–46.0)
Lymphs Abs: 1 10*3/uL (ref 0.7–4.0)
MCHC: 33.9 g/dL (ref 30.0–36.0)
MCV: 100.1 fL — ABNORMAL HIGH (ref 78.0–100.0)
Monocytes Absolute: 0.3 10*3/uL (ref 0.1–1.0)
Monocytes Relative: 8 % (ref 3.0–12.0)
Neutro Abs: 2 10*3/uL (ref 1.4–7.7)
Neutrophils Relative %: 50.8 % (ref 43.0–77.0)
Platelets: 147 10*3/uL — ABNORMAL LOW (ref 150.0–400.0)
RBC: 3.46 Mil/uL — ABNORMAL LOW (ref 4.22–5.81)
RDW: 15.2 % (ref 11.5–15.5)
WBC: 4 10*3/uL (ref 4.0–10.5)

## 2023-03-13 LAB — COMPREHENSIVE METABOLIC PANEL
ALT: 15 U/L (ref 0–53)
AST: 25 U/L (ref 0–37)
Albumin: 3.9 g/dL (ref 3.5–5.2)
Alkaline Phosphatase: 94 U/L (ref 39–117)
BUN: 38 mg/dL — ABNORMAL HIGH (ref 6–23)
CO2: 29 meq/L (ref 19–32)
Calcium: 8.9 mg/dL (ref 8.4–10.5)
Chloride: 96 meq/L (ref 96–112)
Creatinine, Ser: 1.38 mg/dL (ref 0.40–1.50)
GFR: 45.92 mL/min — ABNORMAL LOW (ref >60.00)
Glucose, Bld: 100 mg/dL — ABNORMAL HIGH (ref 70–99)
Potassium: 4.1 meq/L (ref 3.5–5.1)
Sodium: 132 meq/L — ABNORMAL LOW (ref 135–145)
Total Bilirubin: 0.8 mg/dL (ref 0.2–1.2)
Total Protein: 7.1 g/dL (ref 6.0–8.3)

## 2023-03-13 LAB — LIPID PANEL
Cholesterol: 203 mg/dL — ABNORMAL HIGH (ref 0–200)
HDL: 68.7 mg/dL (ref >39.00)
LDL Cholesterol: 117 mg/dL — ABNORMAL HIGH (ref 0–99)
NonHDL: 134.32
Total CHOL/HDL Ratio: 3
Triglycerides: 89 mg/dL (ref 0.0–149.0)
VLDL: 17.8 mg/dL (ref 0.0–40.0)

## 2023-03-13 LAB — T4, FREE: Free T4: 0.85 ng/dL (ref 0.60–1.60)

## 2023-03-13 LAB — HEMOGLOBIN A1C: Hgb A1c MFr Bld: 5.7 % (ref 4.6–6.5)

## 2023-03-13 LAB — TSH: TSH: 13.05 u[IU]/mL — ABNORMAL HIGH (ref 0.35–5.50)

## 2023-03-14 LAB — IRON,TIBC AND FERRITIN PANEL
%SAT: 32 % (ref 20–48)
Ferritin: 92 ng/mL (ref 24–380)
Iron: 96 ug/dL (ref 50–180)
TIBC: 304 ug/dL (ref 250–425)

## 2023-03-25 ENCOUNTER — Ambulatory Visit: Payer: Medicare Other | Attending: Internal Medicine

## 2023-03-25 DIAGNOSIS — I059 Rheumatic mitral valve disease, unspecified: Secondary | ICD-10-CM

## 2023-03-25 DIAGNOSIS — Z7901 Long term (current) use of anticoagulants: Secondary | ICD-10-CM

## 2023-03-25 DIAGNOSIS — G459 Transient cerebral ischemic attack, unspecified: Secondary | ICD-10-CM

## 2023-03-25 DIAGNOSIS — I480 Paroxysmal atrial fibrillation: Secondary | ICD-10-CM | POA: Diagnosis not present

## 2023-03-25 DIAGNOSIS — I6350 Cerebral infarction due to unspecified occlusion or stenosis of unspecified cerebral artery: Secondary | ICD-10-CM | POA: Diagnosis not present

## 2023-03-25 DIAGNOSIS — Z9889 Other specified postprocedural states: Secondary | ICD-10-CM

## 2023-03-25 LAB — POCT INR: INR: 3 (ref 2.0–3.0)

## 2023-03-25 NOTE — Patient Instructions (Signed)
Description   Continue taking Warfarin 1.5 tablets daily except 1 tablet on Sunday and Thursday. Recheck in 4 weeks.  Coumadin Clinic [phone]

## 2023-04-08 ENCOUNTER — Other Ambulatory Visit: Payer: Self-pay | Admitting: Internal Medicine

## 2023-04-23 ENCOUNTER — Ambulatory Visit: Payer: Medicare Other | Attending: Cardiology

## 2023-04-23 DIAGNOSIS — I059 Rheumatic mitral valve disease, unspecified: Secondary | ICD-10-CM | POA: Diagnosis not present

## 2023-04-23 DIAGNOSIS — I480 Paroxysmal atrial fibrillation: Secondary | ICD-10-CM | POA: Diagnosis not present

## 2023-04-23 DIAGNOSIS — Z9889 Other specified postprocedural states: Secondary | ICD-10-CM

## 2023-04-23 DIAGNOSIS — G459 Transient cerebral ischemic attack, unspecified: Secondary | ICD-10-CM | POA: Diagnosis not present

## 2023-04-23 DIAGNOSIS — I6350 Cerebral infarction due to unspecified occlusion or stenosis of unspecified cerebral artery: Secondary | ICD-10-CM | POA: Diagnosis not present

## 2023-04-23 DIAGNOSIS — Z7901 Long term (current) use of anticoagulants: Secondary | ICD-10-CM | POA: Diagnosis not present

## 2023-04-23 LAB — POCT INR: INR: 4.8 — AB (ref 2.0–3.0)

## 2023-04-23 NOTE — Patient Instructions (Signed)
Hold tomorrow only then Continue taking Warfarin 1.5 tablets daily except 1 tablet on Sunday and Thursday. Recheck in 3 weeks.  Coumadin Clinic [phone]

## 2023-05-06 NOTE — Progress Notes (Addendum)
Cardiology Office Note    Date:  05/07/2023  ID:  Anthony Gould, DOB 02-14-1934, MRN [id number] PCP:  Tresa Garter, MD  Cardiologist:  Verne Carrow, MD  Electrophysiologist:  None   Chief Complaint: f/u CHF  History of Present Illness: .    Anthony Gould is a 89 y.o. male with visit-pertinent history of severe mitral regurgitation s/p [employer] MVR in 1986, permanent atrial fibrillation, chronic HFrEF, pulmonary HTN, mild dilation of ascending aorta (normal by 11/2023 echo), moderate PR, severe TR, mild-moderate AI, HTN, HLD (followed by PCP), venous insufficiency, PVCs, CKD stage III by labs, cirrhotic changes of the liver by imaging (followed by PCP), former ETOH use, DM, hyponatremia, prior TIA, syncope, NSVT seen for follow-up.  He has no prior history of coronary disease in his chart and recalled being told he had clean coronaries before his MVR. He had a nuclear stress test in 2014 which was normal along. He previously reported that ever since his MVR, he has had intermittent episodes of very transient disorientiation lasting about 10 seconds. The episodes used to be more frequent (up to 10x/yr) but then he got put on ASA and they decreased in frequency. He has also since been maintained in dypridamole and warfarin and aspirin by his cardiologists (Dr. Okey Dupre then Dr. Clifton Shirl). He was walking on the treadmill in 2020 and had disorientation/syncope. He saw primary care and orthostatics were negative. 2D echocardiogram 02/2019 showed EF 55-60%, moderate TR, moderately elevated pulmonary artery pressure without significant change from prior. Cardiac event monitor showed atrial fib with average HR 80bpm, frequent PVCs, brief episodes of NSVT (vs aberrancy) 3-7 beats without sustained events, and rare episodic bradycardia in the mid 40s, mostly during sleeping hours but one occasion around 5pm. He declined neurology referral/EEG from PCP and also declined referral to EP or  ILR. He had admissions in 2022 for appendix issues requiring appendectomy and C diff c/b hypotension. During one of these admissions 02/2021 he was noted to have decline in LVEF to 30-35% and NSVT. This was managed medically with eventual titration of GDMT and diuresis during 2023. The patient preferred medical management approach without cath. F/u echo 11/2022 EF 45-50%, moderately-severely dilated LV, severely enlarged RV, moderate pHTN, severe BAE, moderate PR, mild-moderate AI, severe TR, cannot assess MR due to shadowing from prosthesis.  He is seen for follow-up today with his wife feeling well. He continues to exercise daily doing arm/leg exercises and strength training with bands, basically keeping up the regimen that his prior PT had him doing. He denies any recent issues with CP, SOB, palpitations, syncope or dizziness. He is pleased with the way he is feeling. Initial BP 146/80, repeat 138/68. He reports home BPs <130, sometimes 1-teens. Last INR was 4.8, advised to hold Coumadin x 1 day then resume prior dosing. He states he had no specific reasons for why this was elevated at that time.   Labwork independently reviewed: 02/2023 K 4.1, Cr 1.38, LFTs ok , Hgb 11.7 and plt 147, TSH 13 with normal FT4 felt stable by primary care, LDL 117, trig 89 (PCP) labs)  ROS: .    Please see the history of present illness.  All other systems are reviewed and otherwise negative.  Studies Reviewed: [name]    EKG:  EKG is ordered today, personally reviewed, demonstrating: EKG Interpretation Date/Time:  Tuesday May 07 2023 11:32:56 EST Ventricular Rate:  67 PR Interval:    QRS Duration:  128 QT Interval:  428 QTC Calculation: 452 R Axis:   -39  Text Interpretation: Atrial fibrillation with controlled ventricular response Occasional premature ventricular contractions Left axis deviation Non-specific intra-ventricular conduction block  Similar to previous tracings Confirmed by Ronie Spies [phone]) on  05/07/2023 11:46:00 AM   CV Studies: Cardiac studies reviewed are outlined and summarized above. Otherwise please see EMR for full report.   Current Reported Medications:.    Current Meds  Medication Sig   allopurinol (ZYLOPRIM) 100 MG tablet TAKE 1 TABLET(100 MG) BY MOUTH DAILY   amoxicillin (AMOXIL) 500 MG tablet Take 4 tablets by mouth 1 hour prior to dental procedures and cleanings.   aspirin 81 MG EC tablet Take 81 mg by mouth daily.   bisoprolol (ZEBETA) 5 MG tablet Take 1 tablet (5 mg total) by mouth daily.   Cholecalciferol (VITAMIN D3) 50 MCG (2000 UT) capsule TAKE 1 CAPSULE BY MOUTH EVERY DAY   dipyridamole (PERSANTINE) 50 MG tablet Take 1 tablet (50 mg total) by mouth 3 (three) times daily.   HYDROcodone-acetaminophen (NORCO) 7.5-325 MG tablet Take 0.5-1 tablets by mouth every 6 (six) hours as needed for severe pain (pain score 7-10).   latanoprost (XALATAN) 0.005 % ophthalmic solution Place 1 drop into both eyes at bedtime.   loratadine (CLARITIN) 10 MG tablet Take 1 tablet (10 mg total) by mouth daily.   sacubitril-valsartan (ENTRESTO) 49-51 MG Take 1 tablet by mouth 2 (two) times daily.   torsemide (DEMADEX) 20 MG tablet TAKE 3 TABLETS(60 MG) BY MOUTH DAILY   warfarin (COUMADIN) 5 MG tablet Take 1 to 1&1/2 tablets by mouth daily as directed by the Anticoagulation Clinic.    Physical Exam:    VS:  BP (!) 146/80   Pulse 69   Ht 5\' 9"  (1.753 m)   Wt 162 lb 12.8 oz (73.8 kg)   SpO2 99%   BMI 24.04 kg/m    Wt Readings from Last 3 Encounters:  05/07/23 162 lb 12.8 oz (73.8 kg)  03/12/23 163 lb (73.9 kg)  12/11/22 160 lb (72.6 kg)    GEN: Well nourished, well developed in no acute distress. Hard of hearing, left hearing aids at home. NECK: No JVD; No carotid bruits CARDIAC: irregularly irregular, valve click without isgnificant murmurs, rubs, gallops RESPIRATORY:  Clear to auscultation without rales, wheezing or rhonchi  ABDOMEN: Soft, non-tender,  non-distended EXTREMITIES:  No edema; No acute deformity   Asessement and Plan:.    1. Chronic HFrEF/HTN - appears euvolemic on exam. He is very pleased with his current regimen and how he is feeling. He will continue torsemide 60mg  daily, Entresto 49/51mg  BID, bisoprolol 5mg  daily. Given h/o syncope and hyponatremia along with age and clinical stability, hold off adding SGLT2i for now. He declined previous invasive re-evaluation of decline in LVEF and preferred medical therapy approach. SBP mildly elevated in clinic today, improving on recheck, with reported controlled values at home. Refill Entresto per patient request.  2. Permanent atrial fib, h/o TIA - historically maintained on ASA 81mg  + diypridamole 50mg  TID + warfarin per his primary cardiologist team (originally Dr. Okey Dupre then Dr. Clifton Ryken in more recent years). Denies any s/sx of bleeding. He had recent spurious rise in INR to 4.8 by last check in January. Given his triple therapy, will recheck INR today (sooner than planned anticoag visit) with BMET, CBC to ensure stability, and keep follow-up with Coumadin clinic as planned 05/14/23.  3. NSVT, PVCs - no further syncope. Patient previously declined EP referral. Given clinical stability,  continue beta blocker at present dose and follow.  4. H/o MVR with residual moderate PR, mild-moderate AI, severe TR, moderate pHTN - clinically stable. Will have him follow up clinically in 6 months at which time it can be decided whether we will recheck a surveillance echo versus follow clinically for now. SBE ppx reviewed. He denied needing a refill on amoxicillin at this time.     Disposition: F/u with Dr. Clifton Ulysee in 6 months.  Signed, Laurann Montana, [name]

## 2023-05-07 ENCOUNTER — Encounter: Payer: Self-pay | Admitting: Physician Assistant

## 2023-05-07 ENCOUNTER — Ambulatory Visit: Payer: Medicare Other | Attending: Physician Assistant | Admitting: Physician Assistant

## 2023-05-07 VITALS — BP 138/68 | HR 69 | Ht 69.0 in | Wt 162.8 lb

## 2023-05-07 DIAGNOSIS — I5022 Chronic systolic (congestive) heart failure: Secondary | ICD-10-CM | POA: Diagnosis not present

## 2023-05-07 DIAGNOSIS — I071 Rheumatic tricuspid insufficiency: Secondary | ICD-10-CM

## 2023-05-07 DIAGNOSIS — I351 Nonrheumatic aortic (valve) insufficiency: Secondary | ICD-10-CM

## 2023-05-07 DIAGNOSIS — I4821 Permanent atrial fibrillation: Secondary | ICD-10-CM | POA: Diagnosis not present

## 2023-05-07 DIAGNOSIS — I493 Ventricular premature depolarization: Secondary | ICD-10-CM

## 2023-05-07 DIAGNOSIS — I4729 Other ventricular tachycardia: Secondary | ICD-10-CM

## 2023-05-07 DIAGNOSIS — Z952 Presence of prosthetic heart valve: Secondary | ICD-10-CM

## 2023-05-07 DIAGNOSIS — I272 Pulmonary hypertension, unspecified: Secondary | ICD-10-CM

## 2023-05-07 MED ORDER — SACUBITRIL-VALSARTAN 49-51 MG PO TABS: 1.0000 | ORAL_TABLET | Freq: Two times a day (BID) | ORAL | 2 refills | Status: DC

## 2023-05-07 NOTE — Patient Instructions (Signed)
Medication Instructions:   Your physician recommends that you continue on your current medications as directed. Please refer to the Current Medication list given to you today.   *If you need a refill on your cardiac medications before your next appointment, please call your pharmacy*   Lab Work:  PLEASE GO DOWN STAIRS  [facility]  FIRST FLOOR  [address] ( GET OFF ELEVATORS MAKE A LEFT AND ANOTHER LEFT LAB ON RIGHT DOWN HALLWAY :  BMET CBC AND PT-INR     If you have labs (blood work) drawn today and your tests are completely normal, you will receive your results only by: MyChart Message (if you have MyChart) OR A paper copy in the mail If you have any lab test that is abnormal or we need to change your treatment, we will call you to review the results.    Testing/Procedures: NONE ORDERED  TODAY     Follow-Up: At [facility], you and your health needs are our priority.  As part of our continuing mission to provide you with exceptional heart care, we have created designated Provider Care Teams.  These Care Teams include your primary Cardiologist (physician) and Advanced Practice Providers (APPs -  Physician Assistants and Nurse Practitioners) who all work together to provide you with the care you need, when you need it.  We recommend signing up for the patient portal called "MyChart".  Sign up information is provided on this After Visit Summary.  MyChart is used to connect with patients for Virtual Visits (Telemedicine).  Patients are able to view lab/test results, encounter notes, upcoming appointments, etc.  Non-urgent messages can be sent to your provider as well.   To learn more about what you can do with MyChart, go to [email or web address].    Your next appointment:    6 month(s)   Provider:    Verne Carrow, MD     Other Instructions    1st Floor: - Lobby - Registration  - Pharmacy  - Lab - Cafe  2nd Floor: - PV Lab - Diagnostic Testing (echo,  CT, nuclear med)  3rd Floor: - Vacant  4th Floor: - TCTS (cardiothoracic surgery) - AFib Clinic - Structural Heart Clinic - Vascular Surgery  - Vascular Ultrasound  5th Floor: - [facility] (general and EP) - Clinical Pharmacy for coumadin, hypertension, lipid, weight-loss medications, and med management appointments    Valet parking services will be available as well.

## 2023-05-09 ENCOUNTER — Telehealth: Payer: Self-pay | Admitting: Cardiovascular Disease

## 2023-05-09 NOTE — Telephone Encounter (Signed)
Patient stated he will have his blood work done at his PCP's office and have the results sent to his cardiologist.

## 2023-05-14 ENCOUNTER — Telehealth: Payer: Self-pay | Admitting: *Deleted

## 2023-05-14 ENCOUNTER — Ambulatory Visit: Payer: Medicare Other | Attending: Cardiovascular Disease | Admitting: *Deleted

## 2023-05-14 DIAGNOSIS — I480 Paroxysmal atrial fibrillation: Secondary | ICD-10-CM | POA: Diagnosis not present

## 2023-05-14 DIAGNOSIS — Z9889 Other specified postprocedural states: Secondary | ICD-10-CM

## 2023-05-14 DIAGNOSIS — I6350 Cerebral infarction due to unspecified occlusion or stenosis of unspecified cerebral artery: Secondary | ICD-10-CM | POA: Diagnosis not present

## 2023-05-14 DIAGNOSIS — M1711 Unilateral primary osteoarthritis, right knee: Secondary | ICD-10-CM | POA: Diagnosis not present

## 2023-05-14 DIAGNOSIS — I059 Rheumatic mitral valve disease, unspecified: Secondary | ICD-10-CM | POA: Diagnosis not present

## 2023-05-14 DIAGNOSIS — Z7901 Long term (current) use of anticoagulants: Secondary | ICD-10-CM

## 2023-05-14 DIAGNOSIS — G459 Transient cerebral ischemic attack, unspecified: Secondary | ICD-10-CM | POA: Diagnosis not present

## 2023-05-14 LAB — POCT INR: INR: 3.9 — AB (ref 2.0–3.0)

## 2023-05-14 NOTE — Patient Instructions (Signed)
Description   Call when you get home with prednisone doseage. DO NOT TAKE ANY WARFARIN TOMORROW THEN START taking Warfarin 1.5 tablets daily except 1 tablet on Sunday, Thursday, Saturday. Recheck in 1 week-prednisone starting today.  Coumadin Clinic [phone]

## 2023-05-14 NOTE — Telephone Encounter (Signed)
Spoke with pt and he was able to provide prednisone dose that he will start tomorrow: prednisone 5mg  taper dose from [facility] per pt is 2/19-30mg , 2/20-25mg , 2/21-20mg , 2/22-15mg , 2/23-10mg , 2/24-5mg    Advised pt to not take any warfarin today 05/15/23 as discussed at the visit, and warfarin dose-2/20-5mg , 2/21-2.5mg , 2/22-5mg , 2/23-2.5mg , 2/24-7.5mg  and report to have INR checked 2/25 per pt and weather availability. Advised to call if needed or with any other new meds, or questions.

## 2023-05-21 ENCOUNTER — Ambulatory Visit: Payer: Medicare Other | Attending: Cardiovascular Disease

## 2023-05-21 DIAGNOSIS — I480 Paroxysmal atrial fibrillation: Secondary | ICD-10-CM | POA: Diagnosis not present

## 2023-05-21 DIAGNOSIS — I6350 Cerebral infarction due to unspecified occlusion or stenosis of unspecified cerebral artery: Secondary | ICD-10-CM | POA: Diagnosis not present

## 2023-05-21 DIAGNOSIS — Z7901 Long term (current) use of anticoagulants: Secondary | ICD-10-CM | POA: Diagnosis not present

## 2023-05-21 DIAGNOSIS — I059 Rheumatic mitral valve disease, unspecified: Secondary | ICD-10-CM | POA: Diagnosis not present

## 2023-05-21 DIAGNOSIS — Z9889 Other specified postprocedural states: Secondary | ICD-10-CM

## 2023-05-21 DIAGNOSIS — G459 Transient cerebral ischemic attack, unspecified: Secondary | ICD-10-CM | POA: Diagnosis not present

## 2023-05-21 LAB — POCT INR: INR: 2.6 (ref 2.0–3.0)

## 2023-05-21 NOTE — Patient Instructions (Signed)
 Take 2 tablets today only then continue taking Warfarin 1.5 tablets daily except 1 tablet on Sunday, Thursday, Saturday. Recheck in 3 weeks  [facility] [phone]

## 2023-05-22 ENCOUNTER — Encounter: Payer: Self-pay | Admitting: Internal Medicine

## 2023-05-22 ENCOUNTER — Ambulatory Visit (INDEPENDENT_AMBULATORY_CARE_PROVIDER_SITE_OTHER): Payer: Medicare Other | Admitting: Internal Medicine

## 2023-05-22 VITALS — BP 110/64 | HR 77 | Temp 97.7°F | Ht 69.0 in | Wt 160.0 lb

## 2023-05-22 DIAGNOSIS — E1159 Type 2 diabetes mellitus with other circulatory complications: Secondary | ICD-10-CM

## 2023-05-22 DIAGNOSIS — M25561 Pain in right knee: Secondary | ICD-10-CM

## 2023-05-22 DIAGNOSIS — I5032 Chronic diastolic (congestive) heart failure: Secondary | ICD-10-CM

## 2023-05-22 DIAGNOSIS — Z7901 Long term (current) use of anticoagulants: Secondary | ICD-10-CM

## 2023-05-22 DIAGNOSIS — K7460 Unspecified cirrhosis of liver: Secondary | ICD-10-CM

## 2023-05-22 DIAGNOSIS — E785 Hyperlipidemia, unspecified: Secondary | ICD-10-CM

## 2023-05-22 DIAGNOSIS — I480 Paroxysmal atrial fibrillation: Secondary | ICD-10-CM

## 2023-05-22 NOTE — Progress Notes (Signed)
 Subjective:  Patient ID: Anthony Gould, male    DOB: 1933-09-18  Age: 89 y.o. MRN: [id number]  CC: Medical Management of Chronic Issues (3 mnth f/u)   HPI Anthony Gould presents for a 3 mo f/u - HTN, R knee pain, CHF  Outpatient Medications Prior to Visit  Medication Sig Dispense Refill   allopurinol (ZYLOPRIM) 100 MG tablet TAKE 1 TABLET(100 MG) BY MOUTH DAILY 90 tablet 3   amoxicillin (AMOXIL) 500 MG tablet Take 4 tablets by mouth 1 hour prior to dental procedures and cleanings. 12 tablet 6   aspirin 81 MG EC tablet Take 81 mg by mouth daily.     bisoprolol (ZEBETA) 5 MG tablet Take 1 tablet (5 mg total) by mouth daily. 90 tablet 3   Cholecalciferol (VITAMIN D3) 50 MCG (2000 UT) capsule TAKE 1 CAPSULE BY MOUTH EVERY DAY 100 capsule 3   dipyridamole (PERSANTINE) 50 MG tablet Take 1 tablet (50 mg total) by mouth 3 (three) times daily. 270 tablet 3   HYDROcodone-acetaminophen (NORCO) 7.5-325 MG tablet Take 0.5-1 tablets by mouth every 6 (six) hours as needed for severe pain (pain score 7-10). 60 tablet 0   latanoprost (XALATAN) 0.005 % ophthalmic solution Place 1 drop into both eyes at bedtime.     loratadine (CLARITIN) 10 MG tablet Take 1 tablet (10 mg total) by mouth daily. 90 tablet 11   predniSONE (DELTASONE) 5 MG tablet Take 1 dose pk by oral route.     sacubitril-valsartan (ENTRESTO) 49-51 MG Take 1 tablet by mouth 2 (two) times daily. 180 tablet 2   torsemide (DEMADEX) 20 MG tablet TAKE 3 TABLETS(60 MG) BY MOUTH DAILY 270 tablet 3   warfarin (COUMADIN) 5 MG tablet Take 1 to 1&1/2 tablets by mouth daily as directed by the Anticoagulation Clinic. 130 tablet 1   No facility-administered medications prior to visit.    ROS: Review of Systems  Constitutional:  Positive for fatigue. Negative for appetite change and unexpected weight change.  HENT:  Negative for congestion, nosebleeds, sneezing, sore throat and trouble swallowing.   Eyes:  Negative for itching and visual  disturbance.  Respiratory:  Negative for cough.   Cardiovascular:  Negative for chest pain, palpitations and leg swelling.  Gastrointestinal:  Negative for abdominal distention, blood in stool, diarrhea and nausea.  Genitourinary:  Negative for frequency and hematuria.  Musculoskeletal:  Positive for arthralgias and gait problem. Negative for back pain, joint swelling and neck pain.  Skin:  Negative for rash.  Neurological:  Negative for dizziness, tremors, speech difficulty and weakness.  Psychiatric/Behavioral:  Negative for agitation, dysphoric mood, sleep disturbance and suicidal ideas. The patient is nervous/anxious.     Objective:  BP 110/64   Pulse 77   Temp 97.7 F (36.5 C) (Oral)   Ht 5\' 9"  (1.753 m)   Wt 160 lb (72.6 kg)   SpO2 97%   BMI 23.63 kg/m   BP Readings from Last 3 Encounters:  05/22/23 110/64  05/07/23 138/68  03/12/23 110/60    Wt Readings from Last 3 Encounters:  05/22/23 160 lb (72.6 kg)  05/07/23 162 lb 12.8 oz (73.8 kg)  03/12/23 163 lb (73.9 kg)    Physical Exam Constitutional:      General: He is not in acute distress.    Appearance: Normal appearance. He is well-developed.     Comments: NAD  Eyes:     Conjunctiva/sclera: Conjunctivae normal.     Pupils: Pupils are equal, round, and reactive  to light.  Neck:     Thyroid: No thyromegaly.     Vascular: No JVD.  Cardiovascular:     Rate and Rhythm: Normal rate and regular rhythm.     Heart sounds: Normal heart sounds. No murmur heard.    No friction rub. No gallop.  Pulmonary:     Effort: Pulmonary effort is normal. No respiratory distress.     Breath sounds: Normal breath sounds. No wheezing or rales.  Chest:     Chest wall: No tenderness.  Abdominal:     General: Bowel sounds are normal. There is no distension.     Palpations: Abdomen is soft. There is no mass.     Tenderness: There is no abdominal tenderness. There is no guarding or rebound.  Musculoskeletal:        General:  Tenderness present. Normal range of motion.     Cervical back: Normal range of motion.     Right lower leg: No edema.     Left lower leg: No edema.  Lymphadenopathy:     Cervical: No cervical adenopathy.  Skin:    General: Skin is warm and dry.     Findings: No rash.  Neurological:     Mental Status: He is alert and oriented to person, place, and time.     Cranial Nerves: No cranial nerve deficit.     Motor: No abnormal muscle tone.     Coordination: Coordination normal.     Gait: Gait abnormal.     Deep Tendon Reflexes: Reflexes are normal and symmetric.  Psychiatric:        Behavior: Behavior normal.        Thought Content: Thought content normal.        Judgment: Judgment normal.    R knee w/pain on ROM Using a cane   Lab Results  Component Value Date   WBC 4.0 03/13/2023   HGB 11.7 (L) 03/13/2023   HCT 34.6 (L) 03/13/2023   PLT 147.0 (L) 03/13/2023   GLUCOSE 100 (H) 03/13/2023   CHOL 203 (H) 03/13/2023   TRIG 89.0 03/13/2023   HDL 68.70 03/13/2023   LDLCALC 117 (H) 03/13/2023   ALT 15 03/13/2023   AST 25 03/13/2023   NA 132 (L) 03/13/2023   K 4.1 03/13/2023   CL 96 03/13/2023   CREATININE 1.38 03/13/2023   BUN 38 (H) 03/13/2023   CO2 29 03/13/2023   TSH 13.05 (H) 03/13/2023   PSA 0.87 10/20/2014   INR 2.6 05/21/2023   HGBA1C 5.7 03/13/2023    ECHOCARDIOGRAM COMPLETE Result Date: 02/27/2021    ECHOCARDIOGRAM REPORT   Patient Name:   Anthony Gould Date of Exam: 02/27/2021 Medical Rec #:  [id number]         Height:       69.0 in Accession #:    [id number]        Weight:       167.6 lb Date of Birth:  [birth year]         BSA:          1.917 m Patient Age:    87 years          BP:           124/56 mmHg Patient Gender: M                 HR:           69 bpm. Exam Location:  Inpatient Procedure: 2D Echo, 3D Echo,  Cardiac Doppler and Color Doppler Indications:     I50.40* Unspecified combined systolic (congestive) and                  diastolic (congestive) heart  failure  History:         Patient has prior history of Echocardiogram examinations, most                  recent 03/09/2019. CHF, Abnormal ECG, Pulmonary HTN and TIA,                  Mitral Valve Disease, Arrythmias:Atrial Fibrillation and NSVT,                  Signs/Symptoms:Chest Pain and Syncope; Risk                  Factors:Hypertension and Diabetes. Mechanical mitral valve.                   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 Procedure Date: 54.  Sonographer:     Sheralyn Boatman RDCS Referring Phys:  [id number] [facility] [place] Diagnosing Phys: Epifanio Lesches MD  Sonographer Comments: Technically difficult study due to poor echo windows. IMPRESSIONS  1. Left ventricular ejection fraction, by estimation, is 40 to 45%. The left ventricle has mildly decreased function. The left ventricle demonstrates regional wall motion abnormalities (see scoring diagram/findings for description). Septal hypokinesis. There is moderate left ventricular hypertrophy. Left ventricular diastolic parameters are indeterminate.  2. Right ventricular systolic function is moderately reduced. The right ventricular size is moderately enlarged. There is moderately elevated pulmonary artery systolic pressure. The estimated right ventricular systolic pressure is 53.2 mmHg.  3. Left atrial size was severely dilated.  4. Right atrial size was severely dilated.  5. 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 Procedure Date: 19. Trivial mitral valve regurgitation.     MG at 63bpm, EOA 2.4 cm^2  6. The tricuspid valve is abnormal. Tricuspid valve regurgitation is severe.  7. The aortic valve is tricuspid. Aortic valve regurgitation is trivial. Aortic valve sclerosis is present, with no evidence of aortic valve stenosis.  8. The inferior vena cava is dilated in size with <50% respiratory variability, suggesting right atrial pressure of  15 mmHg. FINDINGS  Left Ventricle: Left ventricular ejection fraction, by estimation, is 40 to 45%. The left ventricle has mildly decreased function. The left ventricle demonstrates regional wall motion abnormalities. The left ventricular internal cavity size was normal in size. There is moderate left ventricular hypertrophy. Left ventricular diastolic parameters are indeterminate.  LV Wall Scoring: The entire septum is hypokinetic. The entire anterior wall, entire lateral wall, entire inferior wall, and apex are normal. Right Ventricle: The right ventricular size is moderately enlarged. Right vetricular wall thickness was not well visualized. Right ventricular systolic function is moderately reduced. There is moderately elevated pulmonary artery systolic pressure. The tricuspid regurgitant velocity is 3.09 m/s, and with an assumed right atrial pressure of 15 mmHg, the estimated right ventricular systolic pressure is 53.2 mmHg. Left Atrium: Left atrial size was severely dilated. Right Atrium: Right atrial size was severely dilated. Pericardium: There is no evidence of pericardial effusion. Mitral Valve: The mitral valve has been repaired/replaced. Trivial mitral valve regurgitation. There is a unknown size Medtronic tilting disc valve present in  the mitral position. Procedure Date: 50. MV peak gradient, 13.5 mmHg. The mean mitral valve gradient is 4.0 mmHg. Tricuspid Valve: The tricuspid valve is abnormal. Tricuspid valve regurgitation is severe. Aortic Valve: The aortic valve is tricuspid. Aortic valve regurgitation is trivial. Aortic valve sclerosis is present, with no evidence of aortic valve stenosis. Pulmonic Valve: The pulmonic valve was not well visualized. Pulmonic valve regurgitation is trivial. Aorta: The aortic root and ascending aorta are structurally normal, with no evidence of dilitation. Venous: The inferior vena cava is dilated in size with less than 50% respiratory variability, suggesting right  atrial pressure of 15 mmHg. IAS/Shunts: The interatrial septum was not well visualized.  LEFT VENTRICLE PLAX 2D LVIDd:         5.10 cm      Diastology LVIDs:         4.00 cm      LV e' medial:    7.40 cm/s LV PW:         2.00 cm      LV E/e' medial:  22.2 LV IVS:        0.80 cm      LV e' lateral:   7.29 cm/s LVOT diam:     2.40 cm      LV E/e' lateral: 22.5 LV SV:         97 LV SV Index:   51 LVOT Area:     4.52 cm  LV Volumes (MOD) LV vol d, MOD A2C: 135.0 ml LV vol d, MOD A4C: 105.0 ml LV vol s, MOD A2C: 69.9 ml LV vol s, MOD A4C: 59.0 ml LV SV MOD A2C:     65.1 ml LV SV MOD A4C:     105.0 ml LV SV MOD BP:      53.6 ml RIGHT VENTRICLE            IVC RV S prime:     8.33 cm/s  IVC diam: 2.70 cm TAPSE (M-mode): 1.4 cm LEFT ATRIUM            Index        RIGHT ATRIUM           Index LA diam:      5.80 cm  3.03 cm/m   RA Area:     39.60 cm LA Vol (A2C): 72.1 ml  37.62 ml/m  RA Volume:   166.00 ml 86.62 ml/m LA Vol (A4C): 140.0 ml 73.05 ml/m  AORTIC VALVE             PULMONIC VALVE LVOT Vmax:   104.00 cm/s PR End Diast Vel: 2.22 msec LVOT Vmean:  67.300 cm/s LVOT VTI:    0.215 m  AORTA Ao Root diam: 3.60 cm Ao Asc diam:  3.50 cm MITRAL VALVE                TRICUSPID VALVE MV Area (PHT): 3.48 cm     TR Peak grad:   38.2 mmHg MV Area VTI:   2.61 cm     TR Vmax:        309.00 cm/s MV Peak grad:  13.5 mmHg MV Mean grad:  4.0 mmHg     SHUNTS MV Vmax:       1.84 m/s     Systemic VTI:  0.22 m MV Vmean:      88.9 cm/s    Systemic Diam: 2.40 cm MV Decel Time: 218 msec MV E velocity: 164.00 cm/s Epifanio Lesches MD Electronically  signed by Epifanio Lesches MD Signature Date/Time: 02/27/2021/6:41:19 PM    Final (Updated)     Assessment & Plan:   Problem List Items Addressed This Visit     DM2 (diabetes mellitus, type 2) (HCC)   On ASA, Lipitor      Relevant Orders   Hemoglobin A1c   Hyperlipidemia - Primary   Relevant Orders   Comprehensive metabolic panel   Protime-INR   CBC with  Differential/Platelet   TSH   T4, free   PAF (paroxysmal atrial fibrillation) (HCC)   In NSR now Dr Clifton Savir  Cont w/Bisoprolol, Coumadin      Relevant Orders   Comprehensive metabolic panel   Protime-INR   CBC with Differential/Platelet   TSH   T4, free   Long term (current) use of anticoagulants   On Coumadin      Cirrhosis of liver without ascites (HCC)   Relevant Orders   Comprehensive metabolic panel   CBC with Differential/Platelet   Ammonia   Chronic diastolic heart failure (HCC)   On Entresto qd      Knee pain, right   OA - seeing Ortho On Norco prn - rare S/p PO steroids - better          No orders of the defined types were placed in this encounter.     Follow-up: Return in about 3 months (around 08/19/2023) for a follow-up visit.  Sonda Primes, MD

## 2023-05-22 NOTE — Assessment & Plan Note (Signed)
 In NSR now Dr Clifton Michael  Cont w/Bisoprolol, Coumadin

## 2023-05-22 NOTE — Assessment & Plan Note (Signed)
 OA - seeing Ortho On Norco prn - rare S/p PO steroids - better

## 2023-05-22 NOTE — Assessment & Plan Note (Signed)
 On ASA, Lipitor

## 2023-05-22 NOTE — Assessment & Plan Note (Signed)
 On Entresto qd

## 2023-05-22 NOTE — Assessment & Plan Note (Signed)
 On Coumadin

## 2023-05-23 ENCOUNTER — Telehealth: Payer: Self-pay | Admitting: Physician Assistant

## 2023-05-23 LAB — COMPREHENSIVE METABOLIC PANEL
ALT: 21 U/L (ref 0–53)
AST: 27 U/L (ref 0–37)
Albumin: 4.2 g/dL (ref 3.5–5.2)
Alkaline Phosphatase: 85 U/L (ref 39–117)
BUN: 49 mg/dL — ABNORMAL HIGH (ref 6–23)
CO2: 32 meq/L (ref 19–32)
Calcium: 9.2 mg/dL (ref 8.4–10.5)
Chloride: 97 meq/L (ref 96–112)
Creatinine, Ser: 1.5 mg/dL (ref 0.40–1.50)
GFR: 41.49 mL/min — ABNORMAL LOW (ref >60.00)
Glucose, Bld: 103 mg/dL — ABNORMAL HIGH (ref 70–99)
Potassium: 4.5 meq/L (ref 3.5–5.1)
Sodium: 138 meq/L (ref 135–145)
Total Bilirubin: 0.8 mg/dL (ref 0.2–1.2)
Total Protein: 7.2 g/dL (ref 6.0–8.3)

## 2023-05-23 LAB — TSH: TSH: 9.45 u[IU]/mL — ABNORMAL HIGH (ref 0.35–5.50)

## 2023-05-23 LAB — CBC WITH DIFFERENTIAL/PLATELET
Basophils Absolute: 0 10*3/uL (ref 0.0–0.1)
Basophils Relative: 0.8 % (ref 0.0–3.0)
Eosinophils Absolute: 0.3 10*3/uL (ref 0.0–0.7)
Eosinophils Relative: 7.1 % — ABNORMAL HIGH (ref 0.0–5.0)
HCT: 37.9 % — ABNORMAL LOW (ref 39.0–52.0)
Hemoglobin: 12.6 g/dL — ABNORMAL LOW (ref 13.0–17.0)
Lymphocytes Relative: 29.7 % (ref 12.0–46.0)
Lymphs Abs: 1.4 10*3/uL (ref 0.7–4.0)
MCHC: 33.2 g/dL (ref 30.0–36.0)
MCV: 100.7 fL — ABNORMAL HIGH (ref 78.0–100.0)
Monocytes Absolute: 0.4 10*3/uL (ref 0.1–1.0)
Monocytes Relative: 9.2 % (ref 3.0–12.0)
Neutro Abs: 2.4 10*3/uL (ref 1.4–7.7)
Neutrophils Relative %: 53.2 % (ref 43.0–77.0)
Platelets: 182 10*3/uL (ref 150.0–400.0)
RBC: 3.76 Mil/uL — ABNORMAL LOW (ref 4.22–5.81)
RDW: 15.9 % — ABNORMAL HIGH (ref 11.5–15.5)
WBC: 4.6 10*3/uL (ref 4.0–10.5)

## 2023-05-23 LAB — PROTIME-INR
INR: 3.7 {ratio} — ABNORMAL HIGH (ref 0.8–1.0)
Prothrombin Time: 36.6 s — ABNORMAL HIGH (ref 9.6–13.1)

## 2023-05-23 LAB — T4, FREE: Free T4: 0.94 ng/dL (ref 0.60–1.60)

## 2023-05-23 LAB — AMMONIA: Ammonia: 21 umol/L (ref 11–35)

## 2023-05-23 LAB — HEMOGLOBIN A1C: Hgb A1c MFr Bld: 5.7 % (ref 4.6–6.5)

## 2023-05-23 NOTE — Telephone Encounter (Signed)
 FYI Pt tried to go to [facility] to have labs doen but it was so crowded they left. So he had them done at his PCP today and should be in his chart

## 2023-05-23 NOTE — Telephone Encounter (Signed)
 Called and spoke with pt. Made him aware his INR had resulted from PCP today and INR was 3.7. In the [facility]madin Clinic on 05/21/23, INR was 2.6. Pt's INR goal is 3.0-3.5. Advised pt to eat a serving of greens today and continue taking Warfarin 1.5 tablets daily except 1 tablet on Sunday, Thursday, Saturday. Recheck in 3 weeks on 06/11/23. Pt verbalized understanding and aware someone will call him concerning all other labs.

## 2023-05-25 ENCOUNTER — Encounter: Payer: Self-pay | Admitting: Internal Medicine

## 2023-05-29 DIAGNOSIS — H40053 Ocular hypertension, bilateral: Secondary | ICD-10-CM | POA: Diagnosis not present

## 2023-05-29 DIAGNOSIS — H35033 Hypertensive retinopathy, bilateral: Secondary | ICD-10-CM | POA: Diagnosis not present

## 2023-05-29 DIAGNOSIS — H53432 Sector or arcuate defects, left eye: Secondary | ICD-10-CM | POA: Diagnosis not present

## 2023-05-29 DIAGNOSIS — H524 Presbyopia: Secondary | ICD-10-CM | POA: Diagnosis not present

## 2023-06-11 ENCOUNTER — Ambulatory Visit: Payer: Medicare Other | Attending: Internal Medicine | Admitting: *Deleted

## 2023-06-11 DIAGNOSIS — Z9889 Other specified postprocedural states: Secondary | ICD-10-CM | POA: Diagnosis not present

## 2023-06-11 DIAGNOSIS — I6350 Cerebral infarction due to unspecified occlusion or stenosis of unspecified cerebral artery: Secondary | ICD-10-CM

## 2023-06-11 DIAGNOSIS — I059 Rheumatic mitral valve disease, unspecified: Secondary | ICD-10-CM | POA: Diagnosis not present

## 2023-06-11 DIAGNOSIS — Z7901 Long term (current) use of anticoagulants: Secondary | ICD-10-CM | POA: Diagnosis not present

## 2023-06-11 DIAGNOSIS — G459 Transient cerebral ischemic attack, unspecified: Secondary | ICD-10-CM

## 2023-06-11 DIAGNOSIS — I480 Paroxysmal atrial fibrillation: Secondary | ICD-10-CM

## 2023-06-11 LAB — POCT INR: INR: 3.1 — AB (ref 2.0–3.0)

## 2023-06-11 NOTE — Patient Instructions (Addendum)
 Description   Continue taking Warfarin 1.5 tablets daily except 1 tablet on Sunday, Thursday, Saturday. Recheck in 4 weeks.  [facility] [phone]

## 2023-06-12 ENCOUNTER — Other Ambulatory Visit: Payer: Self-pay | Admitting: Internal Medicine

## 2023-06-20 DIAGNOSIS — M1712 Unilateral primary osteoarthritis, left knee: Secondary | ICD-10-CM | POA: Diagnosis not present

## 2023-06-27 DIAGNOSIS — M1711 Unilateral primary osteoarthritis, right knee: Secondary | ICD-10-CM | POA: Diagnosis not present

## 2023-07-04 DIAGNOSIS — M1711 Unilateral primary osteoarthritis, right knee: Secondary | ICD-10-CM | POA: Diagnosis not present

## 2023-07-09 ENCOUNTER — Ambulatory Visit: Attending: Cardiology | Admitting: *Deleted

## 2023-07-09 DIAGNOSIS — Z9889 Other specified postprocedural states: Secondary | ICD-10-CM | POA: Diagnosis not present

## 2023-07-09 DIAGNOSIS — I6350 Cerebral infarction due to unspecified occlusion or stenosis of unspecified cerebral artery: Secondary | ICD-10-CM | POA: Diagnosis not present

## 2023-07-09 DIAGNOSIS — G459 Transient cerebral ischemic attack, unspecified: Secondary | ICD-10-CM | POA: Diagnosis not present

## 2023-07-09 DIAGNOSIS — Z7901 Long term (current) use of anticoagulants: Secondary | ICD-10-CM

## 2023-07-09 DIAGNOSIS — I480 Paroxysmal atrial fibrillation: Secondary | ICD-10-CM | POA: Diagnosis not present

## 2023-07-09 DIAGNOSIS — I059 Rheumatic mitral valve disease, unspecified: Secondary | ICD-10-CM | POA: Diagnosis not present

## 2023-07-09 LAB — POCT INR: INR: 4.2 — AB (ref 2.0–3.0)

## 2023-07-09 NOTE — Patient Instructions (Addendum)
 Description   Do not take any warfarin tomorrow then continue taking Warfarin 1.5 tablets daily except 1 tablet on Sunday, Thursday, Saturday. Recheck in 3 weeks.  [facility] [phone]

## 2023-07-30 ENCOUNTER — Ambulatory Visit: Attending: Cardiovascular Disease

## 2023-07-30 DIAGNOSIS — I6350 Cerebral infarction due to unspecified occlusion or stenosis of unspecified cerebral artery: Secondary | ICD-10-CM | POA: Diagnosis not present

## 2023-07-30 DIAGNOSIS — I059 Rheumatic mitral valve disease, unspecified: Secondary | ICD-10-CM

## 2023-07-30 DIAGNOSIS — G459 Transient cerebral ischemic attack, unspecified: Secondary | ICD-10-CM

## 2023-07-30 DIAGNOSIS — Z7901 Long term (current) use of anticoagulants: Secondary | ICD-10-CM | POA: Diagnosis not present

## 2023-07-30 DIAGNOSIS — I480 Paroxysmal atrial fibrillation: Secondary | ICD-10-CM

## 2023-07-30 DIAGNOSIS — Z9889 Other specified postprocedural states: Secondary | ICD-10-CM

## 2023-07-30 LAB — POCT INR: INR: 4.4 — AB (ref 2.0–3.0)

## 2023-07-30 NOTE — Patient Instructions (Addendum)
 Do not take any warfarin tomorrow then Decrease to 1 tablet daily except 1.5 tablets on Monday, Wednesday and Friday. Recheck in 3 weeks.  [facility] [phone]

## 2023-08-20 ENCOUNTER — Ambulatory Visit: Payer: Medicare Other | Admitting: Internal Medicine

## 2023-08-21 ENCOUNTER — Encounter: Payer: Self-pay | Admitting: Internal Medicine

## 2023-08-21 ENCOUNTER — Ambulatory Visit (INDEPENDENT_AMBULATORY_CARE_PROVIDER_SITE_OTHER): Admitting: Internal Medicine

## 2023-08-21 VITALS — BP 110/72 | HR 50 | Temp 97.8°F | Ht 69.0 in | Wt 167.0 lb

## 2023-08-21 DIAGNOSIS — R5383 Other fatigue: Secondary | ICD-10-CM | POA: Diagnosis not present

## 2023-08-21 DIAGNOSIS — E1122 Type 2 diabetes mellitus with diabetic chronic kidney disease: Secondary | ICD-10-CM | POA: Diagnosis not present

## 2023-08-21 DIAGNOSIS — H9393 Unspecified disorder of ear, bilateral: Secondary | ICD-10-CM

## 2023-08-21 DIAGNOSIS — E1159 Type 2 diabetes mellitus with other circulatory complications: Secondary | ICD-10-CM | POA: Diagnosis not present

## 2023-08-21 DIAGNOSIS — I5033 Acute on chronic diastolic (congestive) heart failure: Secondary | ICD-10-CM

## 2023-08-21 DIAGNOSIS — H9391 Unspecified disorder of right ear: Secondary | ICD-10-CM | POA: Insufficient documentation

## 2023-08-21 DIAGNOSIS — I130 Hypertensive heart and chronic kidney disease with heart failure and stage 1 through stage 4 chronic kidney disease, or unspecified chronic kidney disease: Secondary | ICD-10-CM | POA: Diagnosis not present

## 2023-08-21 DIAGNOSIS — M1712 Unilateral primary osteoarthritis, left knee: Secondary | ICD-10-CM

## 2023-08-21 DIAGNOSIS — N1832 Chronic kidney disease, stage 3b: Secondary | ICD-10-CM

## 2023-08-21 DIAGNOSIS — H9392 Unspecified disorder of left ear: Secondary | ICD-10-CM

## 2023-08-21 DIAGNOSIS — I10 Essential (primary) hypertension: Secondary | ICD-10-CM | POA: Diagnosis not present

## 2023-08-21 NOTE — Assessment & Plan Note (Signed)
 On ASA, Lipitor

## 2023-08-21 NOTE — Assessment & Plan Note (Signed)
Cont w/Entresto 49/51 Hydrate well

## 2023-08-21 NOTE — Assessment & Plan Note (Signed)
 Chronic fatigue, multifactorial Monitor GFR, Hgb Treat CHF

## 2023-08-21 NOTE — Progress Notes (Signed)
 Subjective:  Patient ID: Anthony Gould, male    DOB: 28-Sep-1933  Age: 89 y.o. MRN: [id number]  CC: Medical Management of Chronic Issues (3 mnth f/u)   HPI Anthony Gould presents for CHF, liver disease, OA - knees  Outpatient Medications Prior to Visit  Medication Sig Dispense Refill   allopurinol  (ZYLOPRIM ) 100 MG tablet TAKE 1 TABLET(100 MG) BY MOUTH DAILY 90 tablet 3   aspirin  81 MG EC tablet Take 81 mg by mouth daily.     bisoprolol  (ZEBETA ) 5 MG tablet Take 1 tablet (5 mg total) by mouth daily. 90 tablet 3   Cholecalciferol  (VITAMIN D3) 50 MCG (2000 UT) capsule TAKE 1 CAPSULE BY MOUTH EVERY DAY 100 capsule 3   dipyridamole  (PERSANTINE ) 50 MG tablet Take 1 tablet (50 mg total) by mouth 3 (three) times daily. 270 tablet 3   HYDROcodone -acetaminophen  (NORCO) 7.5-325 MG tablet Take 0.5-1 tablets by mouth every 6 (six) hours as needed for severe pain (pain score 7-10). 60 tablet 0   latanoprost  (XALATAN ) 0.005 % ophthalmic solution Place 1 drop into both eyes at bedtime.     loratadine  (CLARITIN ) 10 MG tablet Take 1 tablet (10 mg total) by mouth daily. 90 tablet 11   predniSONE (DELTASONE) 5 MG tablet Take 1 dose pk by oral route.     sacubitril -valsartan  (ENTRESTO ) 49-51 MG Take 1 tablet by mouth 2 (two) times daily. 180 tablet 2   torsemide  (DEMADEX ) 20 MG tablet TAKE 3 TABLETS(60 MG) BY MOUTH DAILY 270 tablet 3   warfarin (COUMADIN ) 5 MG tablet Take 1 to 1&1/2 tablets by mouth daily as directed by the Anticoagulation Clinic. 130 tablet 1   amoxicillin  (AMOXIL ) 500 MG tablet Take 4 tablets by mouth 1 hour prior to dental procedures and cleanings. (Patient not taking: Reported on 08/21/2023) 12 tablet 6   No facility-administered medications prior to visit.    ROS: Review of Systems  Constitutional:  Positive for fatigue. Negative for appetite change and unexpected weight change.  HENT:  Negative for congestion, nosebleeds, sneezing, sore throat and trouble swallowing.    Eyes:  Negative for itching and visual disturbance.  Respiratory:  Negative for cough.   Cardiovascular:  Negative for chest pain, palpitations and leg swelling.  Gastrointestinal:  Negative for abdominal distention, blood in stool, diarrhea and nausea.  Genitourinary:  Negative for frequency and hematuria.  Musculoskeletal:  Positive for arthralgias and gait problem. Negative for back pain, joint swelling and neck pain.  Skin:  Negative for rash.  Neurological:  Negative for dizziness, tremors, speech difficulty and weakness.  Psychiatric/Behavioral:  Negative for agitation, dysphoric mood, sleep disturbance and suicidal ideas. The patient is not nervous/anxious.     Objective:  BP 110/72   Pulse (!) 50   Temp 97.8 F (36.6 C) (Oral)   Ht 5\' 9"  (1.753 m)   Wt 167 lb (75.8 kg)   SpO2 98%   BMI 24.66 kg/m   BP Readings from Last 3 Encounters:  08/21/23 110/72  05/22/23 110/64  05/07/23 138/68    Wt Readings from Last 3 Encounters:  08/21/23 167 lb (75.8 kg)  05/22/23 160 lb (72.6 kg)  05/07/23 162 lb 12.8 oz (73.8 kg)    Physical Exam Constitutional:      General: He is not in acute distress.    Appearance: Normal appearance. He is well-developed.     Comments: NAD  Eyes:     Conjunctiva/sclera: Conjunctivae normal.     Pupils: Pupils are  equal, round, and reactive to light.  Neck:     Thyroid : No thyromegaly.     Vascular: No JVD.  Cardiovascular:     Rate and Rhythm: Normal rate and regular rhythm.     Heart sounds: Normal heart sounds. No murmur heard.    No friction rub. No gallop.  Pulmonary:     Effort: Pulmonary effort is normal. No respiratory distress.     Breath sounds: Normal breath sounds. No wheezing or rales.  Chest:     Chest wall: No tenderness.  Abdominal:     General: Bowel sounds are normal. There is no distension.     Palpations: Abdomen is soft. There is no mass.     Tenderness: There is no abdominal tenderness. There is no guarding or  rebound.  Musculoskeletal:        General: No tenderness. Normal range of motion.     Cervical back: Normal range of motion.  Lymphadenopathy:     Cervical: No cervical adenopathy.  Skin:    General: Skin is warm and dry.     Findings: No rash.  Neurological:     Mental Status: He is alert and oriented to person, place, and time.     Cranial Nerves: No cranial nerve deficit.     Motor: No abnormal muscle tone.     Coordination: Coordination normal.     Gait: Gait normal.     Deep Tendon Reflexes: Reflexes are normal and symmetric.  Psychiatric:        Behavior: Behavior normal.        Thought Content: Thought content normal.        Judgment: Judgment normal.   Using a cane Nodule on skin below R ear    Lab Results  Component Value Date   WBC 4.6 05/23/2023   HGB 12.6 (L) 05/23/2023   HCT 37.9 (L) 05/23/2023   PLT 182.0 05/23/2023   GLUCOSE 103 (H) 05/23/2023   CHOL 203 (H) 03/13/2023   TRIG 89.0 03/13/2023   HDL 68.70 03/13/2023   LDLCALC 117 (H) 03/13/2023   ALT 21 05/23/2023   AST 27 05/23/2023   NA 138 05/23/2023   K 4.5 05/23/2023   CL 97 05/23/2023   CREATININE 1.50 05/23/2023   BUN 49 (H) 05/23/2023   CO2 32 05/23/2023   TSH 9.45 (H) 05/23/2023   PSA 0.87 10/20/2014   INR 4.4 (A) 07/30/2023   HGBA1C 5.7 05/23/2023    ECHOCARDIOGRAM COMPLETE Result Date: 02/27/2021    ECHOCARDIOGRAM REPORT   Patient Name:   Anthony Gould Date of Exam: 02/27/2021 Medical Rec #:  [id number]         Height:       69.0 in Accession #:    [id number]        Weight:       167.6 lb Date of Birth:  Dec 04, 1933         BSA:          1.917 m Patient Age:    87 years          BP:           124/56 mmHg Patient Gender: M                 HR:           69 bpm. Exam Location:  Inpatient Procedure: 2D Echo, 3D Echo, Cardiac Doppler and Color Doppler Indications:     I50.40* Unspecified combined systolic (  congestive) and                  diastolic (congestive) heart failure  History:          Patient has prior history of Echocardiogram examinations, most                  recent 03/09/2019. CHF, Abnormal ECG, Pulmonary HTN and TIA,                  Mitral Valve Disease, Arrythmias:Atrial Fibrillation and NSVT,                  Signs/Symptoms:Chest Pain and Syncope; Risk                  Factors:Hypertension and Diabetes. Mechanical mitral valve.                   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 Procedure Date: 43.  Sonographer:     Raynelle Callow RDCS Referring Phys:  [id number] [facility] [place] Diagnosing Phys: Carson Clara MD  Sonographer Comments: Technically difficult study due to poor echo windows. IMPRESSIONS  1. Left ventricular ejection fraction, by estimation, is 40 to 45%. The left ventricle has mildly decreased function. The left ventricle demonstrates regional wall motion abnormalities (see scoring diagram/findings for description). Septal hypokinesis. There is moderate left ventricular hypertrophy. Left ventricular diastolic parameters are indeterminate.  2. Right ventricular systolic function is moderately reduced. The right ventricular size is moderately enlarged. There is moderately elevated pulmonary artery systolic pressure. The estimated right ventricular systolic pressure is 53.2 mmHg.  3. Left atrial size was severely dilated.  4. Right atrial size was severely dilated.  5. 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 Procedure Date: 26. Trivial mitral valve regurgitation.     MG at 63bpm, EOA 2.4 cm^2  6. The tricuspid valve is abnormal. Tricuspid valve regurgitation is severe.  7. The aortic valve is tricuspid. Aortic valve regurgitation is trivial. Aortic valve sclerosis is present, with no evidence of aortic valve stenosis.  8. The inferior vena cava is dilated in size with <50% respiratory variability, suggesting right atrial pressure of 15 mmHg. FINDINGS  Left  Ventricle: Left ventricular ejection fraction, by estimation, is 40 to 45%. The left ventricle has mildly decreased function. The left ventricle demonstrates regional wall motion abnormalities. The left ventricular internal cavity size was normal in size. There is moderate left ventricular hypertrophy. Left ventricular diastolic parameters are indeterminate.  LV Wall Scoring: The entire septum is hypokinetic. The entire anterior wall, entire lateral wall, entire inferior wall, and apex are normal. Right Ventricle: The right ventricular size is moderately enlarged. Right vetricular wall thickness was not well visualized. Right ventricular systolic function is moderately reduced. There is moderately elevated pulmonary artery systolic pressure. The tricuspid regurgitant velocity is 3.09 m/s, and with an assumed right atrial pressure of 15 mmHg, the estimated right ventricular systolic pressure is 53.2 mmHg. Left Atrium: Left atrial size was severely dilated. Right Atrium: Right atrial size was severely dilated. Pericardium: There is no evidence of pericardial effusion. Mitral Valve: The mitral valve has been repaired/replaced. Trivial mitral valve regurgitation. There is a unknown size Medtronic tilting disc valve present in the mitral position. Procedure Date: 66. MV peak gradient, 13.5 mmHg. The mean mitral  valve gradient is 4.0 mmHg. Tricuspid Valve: The tricuspid valve is abnormal. Tricuspid valve regurgitation is severe. Aortic Valve: The aortic valve is tricuspid. Aortic valve regurgitation is trivial. Aortic valve sclerosis is present, with no evidence of aortic valve stenosis. Pulmonic Valve: The pulmonic valve was not well visualized. Pulmonic valve regurgitation is trivial. Aorta: The aortic root and ascending aorta are structurally normal, with no evidence of dilitation. Venous: The inferior vena cava is dilated in size with less than 50% respiratory variability, suggesting right atrial pressure of 15  mmHg. IAS/Shunts: The interatrial septum was not well visualized.  LEFT VENTRICLE PLAX 2D LVIDd:         5.10 cm      Diastology LVIDs:         4.00 cm      LV e' medial:    7.40 cm/s LV PW:         2.00 cm      LV E/e' medial:  22.2 LV IVS:        0.80 cm      LV e' lateral:   7.29 cm/s LVOT diam:     2.40 cm      LV E/e' lateral: 22.5 LV SV:         97 LV SV Index:   51 LVOT Area:     4.52 cm  LV Volumes (MOD) LV vol d, MOD A2C: 135.0 ml LV vol d, MOD A4C: 105.0 ml LV vol s, MOD A2C: 69.9 ml LV vol s, MOD A4C: 59.0 ml LV SV MOD A2C:     65.1 ml LV SV MOD A4C:     105.0 ml LV SV MOD BP:      53.6 ml RIGHT VENTRICLE            IVC RV S prime:     8.33 cm/s  IVC diam: 2.70 cm TAPSE (M-mode): 1.4 cm LEFT ATRIUM            Index        RIGHT ATRIUM           Index LA diam:      5.80 cm  3.03 cm/m   RA Area:     39.60 cm LA Vol (A2C): 72.1 ml  37.62 ml/m  RA Volume:   166.00 ml 86.62 ml/m LA Vol (A4C): 140.0 ml 73.05 ml/m  AORTIC VALVE             PULMONIC VALVE LVOT Vmax:   104.00 cm/s PR End Diast Vel: 2.22 msec LVOT Vmean:  67.300 cm/s LVOT VTI:    0.215 m  AORTA Ao Root diam: 3.60 cm Ao Asc diam:  3.50 cm MITRAL VALVE                TRICUSPID VALVE MV Area (PHT): 3.48 cm     TR Peak grad:   38.2 mmHg MV Area VTI:   2.61 cm     TR Vmax:        309.00 cm/s MV Peak grad:  13.5 mmHg MV Mean grad:  4.0 mmHg     SHUNTS MV Vmax:       1.84 m/s     Systemic VTI:  0.22 m MV Vmean:      88.9 cm/s    Systemic Diam: 2.40 cm MV Decel Time: 218 msec MV E velocity: 164.00 cm/s Carson Clara MD Electronically signed by Carson Clara MD Signature Date/Time: 02/27/2021/6:41:19 PM    Final (Updated)  Assessment & Plan:   Problem List Items Addressed This Visit     DM2 (diabetes mellitus, type 2) (HCC)   On ASA, Lipitor      Relevant Orders   Comprehensive metabolic panel with GFR   CBC with Differential/Platelet   TSH   T4, free   Essential hypertension   SBP 130-140 at home - OK per Dr  Mitzie Anda Cont w/Bisoprolol , Diltiazem  d/c, ASA, Coumadin , Norvasc       Relevant Orders   Comprehensive metabolic panel with GFR   CBC with Differential/Platelet   TSH   T4, free   CKD (chronic kidney disease), stage III (HCC)   Cont w/Entresto  49/51 Hydrate well      Acute on chronic diastolic CHF (congestive heart failure) (HCC)   ECHO is better      Fatigue - Primary   Chronic fatigue, multifactorial Monitor GFR, Hgb Treat CHF      Relevant Orders   Comprehensive metabolic panel with GFR   CBC with Differential/Platelet   TSH   T4, free   Osteoarthritis of left knee   Dr France Ina Getting injections      Lesion of right ear   New- see photo Probable cancer Will ref to see Dr Ramon Burn         No orders of the defined types were placed in this encounter.     Follow-up: Return in about 2 months (around 10/21/2023) for a follow-up visit.  Anitra Barn, MD

## 2023-08-21 NOTE — Assessment & Plan Note (Signed)
 Dr France Ina Getting injections

## 2023-08-21 NOTE — Assessment & Plan Note (Signed)
ECHO is better

## 2023-08-21 NOTE — Assessment & Plan Note (Signed)
SBP 130-140 at home - OK per Dr Shirlee Latch Cont w/Bisoprolol, Diltiazem d/c, ASA, Coumadin, Norvasc

## 2023-08-21 NOTE — Assessment & Plan Note (Addendum)
 New- see photo Probable cancer Will ref to see Dr Ramon Burn

## 2023-08-22 ENCOUNTER — Other Ambulatory Visit (INDEPENDENT_AMBULATORY_CARE_PROVIDER_SITE_OTHER)

## 2023-08-22 ENCOUNTER — Ambulatory Visit: Attending: Cardiovascular Disease

## 2023-08-22 DIAGNOSIS — E1159 Type 2 diabetes mellitus with other circulatory complications: Secondary | ICD-10-CM | POA: Diagnosis not present

## 2023-08-22 DIAGNOSIS — G459 Transient cerebral ischemic attack, unspecified: Secondary | ICD-10-CM | POA: Diagnosis not present

## 2023-08-22 DIAGNOSIS — I059 Rheumatic mitral valve disease, unspecified: Secondary | ICD-10-CM

## 2023-08-22 DIAGNOSIS — I6350 Cerebral infarction due to unspecified occlusion or stenosis of unspecified cerebral artery: Secondary | ICD-10-CM

## 2023-08-22 DIAGNOSIS — Z7901 Long term (current) use of anticoagulants: Secondary | ICD-10-CM

## 2023-08-22 DIAGNOSIS — Z9889 Other specified postprocedural states: Secondary | ICD-10-CM

## 2023-08-22 DIAGNOSIS — M1712 Unilateral primary osteoarthritis, left knee: Secondary | ICD-10-CM | POA: Diagnosis not present

## 2023-08-22 DIAGNOSIS — R5383 Other fatigue: Secondary | ICD-10-CM

## 2023-08-22 DIAGNOSIS — I480 Paroxysmal atrial fibrillation: Secondary | ICD-10-CM | POA: Diagnosis not present

## 2023-08-22 DIAGNOSIS — I10 Essential (primary) hypertension: Secondary | ICD-10-CM | POA: Diagnosis not present

## 2023-08-22 LAB — CBC WITH DIFFERENTIAL/PLATELET
Basophils Absolute: 0 10*3/uL (ref 0.0–0.1)
Basophils Relative: 1 % (ref 0.0–3.0)
Eosinophils Absolute: 0.5 10*3/uL (ref 0.0–0.7)
Eosinophils Relative: 11.2 % — ABNORMAL HIGH (ref 0.0–5.0)
HCT: 35.1 % — ABNORMAL LOW (ref 39.0–52.0)
Hemoglobin: 11.8 g/dL — ABNORMAL LOW (ref 13.0–17.0)
Lymphocytes Relative: 22.9 % (ref 12.0–46.0)
Lymphs Abs: 1 10*3/uL (ref 0.7–4.0)
MCHC: 33.6 g/dL (ref 30.0–36.0)
MCV: 98 fl (ref 78.0–100.0)
Monocytes Absolute: 0.4 10*3/uL (ref 0.1–1.0)
Monocytes Relative: 9.9 % (ref 3.0–12.0)
Neutro Abs: 2.5 10*3/uL (ref 1.4–7.7)
Neutrophils Relative %: 55 % (ref 43.0–77.0)
Platelets: 155 10*3/uL (ref 150.0–400.0)
RBC: 3.59 Mil/uL — ABNORMAL LOW (ref 4.22–5.81)
RDW: 15.3 % (ref 11.5–15.5)
WBC: 4.5 10*3/uL (ref 4.0–10.5)

## 2023-08-22 LAB — POCT INR: INR: 3.9 — AB (ref 2.0–3.0)

## 2023-08-22 LAB — COMPREHENSIVE METABOLIC PANEL WITH GFR
ALT: 13 U/L (ref 0–53)
AST: 21 U/L (ref 0–37)
Albumin: 4.3 g/dL (ref 3.5–5.2)
Alkaline Phosphatase: 93 U/L (ref 39–117)
BUN: 42 mg/dL — ABNORMAL HIGH (ref 6–23)
CO2: 29 meq/L (ref 19–32)
Calcium: 9.2 mg/dL (ref 8.4–10.5)
Chloride: 93 meq/L — ABNORMAL LOW (ref 96–112)
Creatinine, Ser: 1.47 mg/dL (ref 0.40–1.50)
GFR: 42.44 mL/min — ABNORMAL LOW (ref >60.00)
Glucose, Bld: 97 mg/dL (ref 70–99)
Potassium: 4.1 meq/L (ref 3.5–5.1)
Sodium: 130 meq/L — ABNORMAL LOW (ref 135–145)
Total Bilirubin: 0.9 mg/dL (ref 0.2–1.2)
Total Protein: 7.1 g/dL (ref 6.0–8.3)

## 2023-08-22 LAB — TSH: TSH: 14.16 u[IU]/mL — ABNORMAL HIGH (ref 0.35–5.50)

## 2023-08-22 LAB — T4, FREE: Free T4: 0.94 ng/dL (ref 0.60–1.60)

## 2023-08-22 MED ORDER — WARFARIN SODIUM 5 MG PO TABS: ORAL_TABLET | ORAL | 1 refills | Status: DC

## 2023-08-22 NOTE — Patient Instructions (Addendum)
 Description   Do not take any warfarin tomorrow then Decrease to 1 tablet daily except 1.5 tablets on Mondays and Wednesdays.  Recheck in 3 weeks.  Coumadin  Clinic [phone]

## 2023-08-27 ENCOUNTER — Ambulatory Visit: Payer: Self-pay | Admitting: Internal Medicine

## 2023-08-29 DIAGNOSIS — M1712 Unilateral primary osteoarthritis, left knee: Secondary | ICD-10-CM | POA: Diagnosis not present

## 2023-09-05 DIAGNOSIS — M1712 Unilateral primary osteoarthritis, left knee: Secondary | ICD-10-CM | POA: Diagnosis not present

## 2023-09-10 ENCOUNTER — Other Ambulatory Visit (HOSPITAL_COMMUNITY): Payer: Self-pay

## 2023-09-12 ENCOUNTER — Ambulatory Visit: Attending: Cardiovascular Disease

## 2023-09-12 DIAGNOSIS — Z9889 Other specified postprocedural states: Secondary | ICD-10-CM

## 2023-09-12 DIAGNOSIS — I6350 Cerebral infarction due to unspecified occlusion or stenosis of unspecified cerebral artery: Secondary | ICD-10-CM | POA: Diagnosis not present

## 2023-09-12 DIAGNOSIS — I059 Rheumatic mitral valve disease, unspecified: Secondary | ICD-10-CM | POA: Diagnosis not present

## 2023-09-12 DIAGNOSIS — G459 Transient cerebral ischemic attack, unspecified: Secondary | ICD-10-CM | POA: Diagnosis not present

## 2023-09-12 DIAGNOSIS — I480 Paroxysmal atrial fibrillation: Secondary | ICD-10-CM | POA: Diagnosis not present

## 2023-09-12 DIAGNOSIS — Z7901 Long term (current) use of anticoagulants: Secondary | ICD-10-CM

## 2023-09-12 DIAGNOSIS — L989 Disorder of the skin and subcutaneous tissue, unspecified: Secondary | ICD-10-CM | POA: Diagnosis not present

## 2023-09-12 LAB — POCT INR: INR: 4.2 — AB (ref 2.0–3.0)

## 2023-09-12 NOTE — Progress Notes (Signed)
Please see anticoagulation encounter.

## 2023-09-12 NOTE — Patient Instructions (Signed)
 Description   Do not take any warfarin tomorrow then Decrease to 1 tablet daily except 1.5 tablets on Wednesdays.  Recheck in 3 weeks.  Coumadin  Clinic [phone]

## 2023-10-03 ENCOUNTER — Ambulatory Visit: Attending: Cardiovascular Disease

## 2023-10-03 DIAGNOSIS — I480 Paroxysmal atrial fibrillation: Secondary | ICD-10-CM | POA: Diagnosis not present

## 2023-10-03 DIAGNOSIS — Z9889 Other specified postprocedural states: Secondary | ICD-10-CM

## 2023-10-03 DIAGNOSIS — I059 Rheumatic mitral valve disease, unspecified: Secondary | ICD-10-CM

## 2023-10-03 DIAGNOSIS — Z7901 Long term (current) use of anticoagulants: Secondary | ICD-10-CM | POA: Diagnosis not present

## 2023-10-03 DIAGNOSIS — G459 Transient cerebral ischemic attack, unspecified: Secondary | ICD-10-CM | POA: Diagnosis not present

## 2023-10-03 DIAGNOSIS — I6350 Cerebral infarction due to unspecified occlusion or stenosis of unspecified cerebral artery: Secondary | ICD-10-CM

## 2023-10-03 LAB — POCT INR: INR: 3.1 — AB (ref 2.0–3.0)

## 2023-10-03 NOTE — Progress Notes (Signed)
Please see anticoagulation encounter.

## 2023-10-03 NOTE — Patient Instructions (Signed)
 Description   Continue to 1 tablet daily except 1.5 tablets on Wednesdays.  Recheck in 4 weeks.  Coumadin  Clinic [phone]

## 2023-10-24 ENCOUNTER — Encounter: Payer: Self-pay | Admitting: Internal Medicine

## 2023-10-24 ENCOUNTER — Ambulatory Visit (INDEPENDENT_AMBULATORY_CARE_PROVIDER_SITE_OTHER): Admitting: Internal Medicine

## 2023-10-24 VITALS — BP 132/76 | HR 51 | Temp 97.7°F | Ht 69.0 in | Wt 163.0 lb

## 2023-10-24 DIAGNOSIS — I2510 Atherosclerotic heart disease of native coronary artery without angina pectoris: Secondary | ICD-10-CM

## 2023-10-24 DIAGNOSIS — D500 Iron deficiency anemia secondary to blood loss (chronic): Secondary | ICD-10-CM

## 2023-10-24 DIAGNOSIS — E1159 Type 2 diabetes mellitus with other circulatory complications: Secondary | ICD-10-CM

## 2023-10-24 DIAGNOSIS — I480 Paroxysmal atrial fibrillation: Secondary | ICD-10-CM

## 2023-10-24 DIAGNOSIS — L989 Disorder of the skin and subcutaneous tissue, unspecified: Secondary | ICD-10-CM | POA: Diagnosis not present

## 2023-10-24 DIAGNOSIS — N1832 Chronic kidney disease, stage 3b: Secondary | ICD-10-CM | POA: Diagnosis not present

## 2023-10-24 DIAGNOSIS — M1000 Idiopathic gout, unspecified site: Secondary | ICD-10-CM

## 2023-10-24 NOTE — Assessment & Plan Note (Signed)
On ASA,  Lipitor, Bisoprolol  ?

## 2023-10-24 NOTE — Assessment & Plan Note (Signed)
 On ASA, Lipitor

## 2023-10-24 NOTE — Assessment & Plan Note (Signed)
Cont w/Entresto 49/51 Hydrate well

## 2023-10-24 NOTE — Assessment & Plan Note (Signed)
 Check CBC

## 2023-10-24 NOTE — Assessment & Plan Note (Signed)
 No substantial relapse

## 2023-10-24 NOTE — Progress Notes (Signed)
 Subjective:  Patient ID: Anthony Gould, male    DOB: 09/23/1933  Age: 89 y.o. MRN: [id number]  CC: No chief complaint on file.   HPI Anthony Gould presents for   Outpatient Medications Prior to Visit  Medication Sig Dispense Refill   allopurinol  (ZYLOPRIM ) 100 MG tablet TAKE 1 TABLET(100 MG) BY MOUTH DAILY 90 tablet 3   aspirin  81 MG EC tablet Take 81 mg by mouth daily.     bisoprolol  (ZEBETA ) 5 MG tablet Take 1 tablet (5 mg total) by mouth daily. 90 tablet 3   Cholecalciferol  (VITAMIN D3) 50 MCG (2000 UT) capsule TAKE 1 CAPSULE BY MOUTH EVERY DAY 100 capsule 3   dipyridamole  (PERSANTINE ) 50 MG tablet Take 1 tablet (50 mg total) by mouth 3 (three) times daily. 270 tablet 3   HYDROcodone -acetaminophen  (NORCO) 7.5-325 MG tablet Take 0.5-1 tablets by mouth every 6 (six) hours as needed for severe pain (pain score 7-10). 60 tablet 0   latanoprost  (XALATAN ) 0.005 % ophthalmic solution Place 1 drop into both eyes at bedtime.     loratadine  (CLARITIN ) 10 MG tablet Take 1 tablet (10 mg total) by mouth daily. 90 tablet 11   predniSONE (DELTASONE) 5 MG tablet Take 1 dose pk by oral route.     sacubitril -valsartan  (ENTRESTO ) 49-51 MG Take 1 tablet by mouth 2 (two) times daily. 180 tablet 2   torsemide  (DEMADEX ) 20 MG tablet TAKE 3 TABLETS(60 MG) BY MOUTH DAILY 270 tablet 3   warfarin (COUMADIN ) 5 MG tablet Take 1 to 1&1/2 tablets by mouth daily as directed by the Anticoagulation Clinic. 100 tablet 1   amoxicillin  (AMOXIL ) 500 MG tablet Take 4 tablets by mouth 1 hour prior to dental procedures and cleanings. (Patient not taking: Reported on 10/24/2023) 12 tablet 6   No facility-administered medications prior to visit.    ROS: Review of Systems  Constitutional:  Negative for appetite change, fatigue and unexpected weight change.  HENT:  Negative for congestion, nosebleeds, sneezing, sore throat and trouble swallowing.   Eyes:  Negative for itching and visual disturbance.  Respiratory:   Negative for cough.   Cardiovascular:  Negative for chest pain, palpitations and leg swelling.  Gastrointestinal:  Negative for abdominal distention, blood in stool, diarrhea and nausea.  Genitourinary:  Negative for frequency and hematuria.  Musculoskeletal:  Positive for back pain and gait problem. Negative for joint swelling and neck pain.  Skin:  Negative for rash.  Neurological:  Negative for dizziness, tremors, speech difficulty and weakness.  Psychiatric/Behavioral:  Negative for agitation, dysphoric mood and sleep disturbance. The patient is not nervous/anxious.     Objective:  BP 132/76   Pulse (!) 51   Temp 97.7 F (36.5 C) (Oral)   Ht 5' 9 (1.753 m)   Wt 163 lb (73.9 kg)   SpO2 100%   BMI 24.07 kg/m   BP Readings from Last 3 Encounters:  10/24/23 132/76  08/21/23 110/72  05/22/23 110/64    Wt Readings from Last 3 Encounters:  10/24/23 163 lb (73.9 kg)  08/21/23 167 lb (75.8 kg)  05/22/23 160 lb (72.6 kg)    Physical Exam Constitutional:      General: He is not in acute distress.    Appearance: He is well-developed.     Comments: NAD  Eyes:     Conjunctiva/sclera: Conjunctivae normal.     Pupils: Pupils are equal, round, and reactive to light.  Neck:     Thyroid : No thyromegaly.  Vascular: No JVD.  Cardiovascular:     Rate and Rhythm: Normal rate and regular rhythm.     Heart sounds: Normal heart sounds. No murmur heard.    No friction rub. No gallop.  Pulmonary:     Effort: Pulmonary effort is normal. No respiratory distress.     Breath sounds: Normal breath sounds. No wheezing or rales.  Chest:     Chest wall: No tenderness.  Abdominal:     General: Bowel sounds are normal. There is no distension.     Palpations: Abdomen is soft. There is no mass.     Tenderness: There is no abdominal tenderness. There is no guarding or rebound.  Musculoskeletal:        General: No tenderness. Normal range of motion.     Cervical back: Normal range of motion.   Lymphadenopathy:     Cervical: No cervical adenopathy.  Skin:    General: Skin is warm and dry.     Findings: No rash.  Neurological:     Mental Status: He is alert and oriented to person, place, and time.     Cranial Nerves: No cranial nerve deficit.     Motor: No abnormal muscle tone.     Coordination: Coordination normal.     Gait: Gait normal.     Deep Tendon Reflexes: Reflexes are normal and symmetric.  Psychiatric:        Behavior: Behavior normal.        Thought Content: Thought content normal.        Judgment: Judgment normal.   Using a cane  Lab Results  Component Value Date   WBC 4.5 08/22/2023   HGB 11.8 (L) 08/22/2023   HCT 35.1 (L) 08/22/2023   PLT 155.0 08/22/2023   GLUCOSE 97 08/22/2023   CHOL 203 (H) 03/13/2023   TRIG 89.0 03/13/2023   HDL 68.70 03/13/2023   LDLCALC 117 (H) 03/13/2023   ALT 13 08/22/2023   AST 21 08/22/2023   NA 130 (L) 08/22/2023   K 4.1 08/22/2023   CL 93 (L) 08/22/2023   CREATININE 1.47 08/22/2023   BUN 42 (H) 08/22/2023   CO2 29 08/22/2023   TSH 14.16 (H) 08/22/2023   PSA 0.87 10/20/2014   INR 3.1 (A) 10/03/2023   HGBA1C 5.7 05/23/2023    ECHOCARDIOGRAM COMPLETE Result Date: 02/27/2021    ECHOCARDIOGRAM REPORT   Patient Name:   Anthony Gould Date of Exam: 02/27/2021 Medical Rec #:  [id number]         Height:       69.0 in Accession #:    [id number]        Weight:       167.6 lb Date of Birth:  Jun 28, 1933         BSA:          1.917 m Patient Age:    87 years          BP:           124/56 mmHg Patient Gender: M                 HR:           69 bpm. Exam Location:  Inpatient Procedure: 2D Echo, 3D Echo, Cardiac Doppler and Color Doppler Indications:     I50.40* Unspecified combined systolic (congestive) and                  diastolic (congestive) heart failure  History:  Patient has prior history of Echocardiogram examinations, most                  recent 03/09/2019. CHF, Abnormal ECG, Pulmonary HTN and TIA,                   Mitral Valve Disease, Arrythmias:Atrial Fibrillation and NSVT,                  Signs/Symptoms:Chest Pain and Syncope; Risk                  Factors:Hypertension and Diabetes. Mechanical mitral valve.                   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 Procedure Date: 58.  Sonographer:     Ellouise Mose RDCS Referring Phys:  [id number] [facility] [place] Diagnosing Phys: Lonni Nanas MD  Sonographer Comments: Technically difficult study due to poor echo windows. IMPRESSIONS  1. Left ventricular ejection fraction, by estimation, is 40 to 45%. The left ventricle has mildly decreased function. The left ventricle demonstrates regional wall motion abnormalities (see scoring diagram/findings for description). Septal hypokinesis. There is moderate left ventricular hypertrophy. Left ventricular diastolic parameters are indeterminate.  2. Right ventricular systolic function is moderately reduced. The right ventricular size is moderately enlarged. There is moderately elevated pulmonary artery systolic pressure. The estimated right ventricular systolic pressure is 53.2 mmHg.  3. Left atrial size was severely dilated.  4. Right atrial size was severely dilated.  5. 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 Procedure Date: 82. Trivial mitral valve regurgitation.     MG at 63bpm, EOA 2.4 cm^2  6. The tricuspid valve is abnormal. Tricuspid valve regurgitation is severe.  7. The aortic valve is tricuspid. Aortic valve regurgitation is trivial. Aortic valve sclerosis is present, with no evidence of aortic valve stenosis.  8. The inferior vena cava is dilated in size with <50% respiratory variability, suggesting right atrial pressure of 15 mmHg. FINDINGS  Left Ventricle: Left ventricular ejection fraction, by estimation, is 40 to 45%. The left ventricle has mildly decreased function. The left ventricle demonstrates  regional wall motion abnormalities. The left ventricular internal cavity size was normal in size. There is moderate left ventricular hypertrophy. Left ventricular diastolic parameters are indeterminate.  LV Wall Scoring: The entire septum is hypokinetic. The entire anterior wall, entire lateral wall, entire inferior wall, and apex are normal. Right Ventricle: The right ventricular size is moderately enlarged. Right vetricular wall thickness was not well visualized. Right ventricular systolic function is moderately reduced. There is moderately elevated pulmonary artery systolic pressure. The tricuspid regurgitant velocity is 3.09 m/s, and with an assumed right atrial pressure of 15 mmHg, the estimated right ventricular systolic pressure is 53.2 mmHg. Left Atrium: Left atrial size was severely dilated. Right Atrium: Right atrial size was severely dilated. Pericardium: There is no evidence of pericardial effusion. Mitral Valve: The mitral valve has been repaired/replaced. Trivial mitral valve regurgitation. There is a unknown size Medtronic tilting disc valve present in the mitral position. Procedure Date: 70. MV peak gradient, 13.5 mmHg. The mean mitral valve gradient is 4.0 mmHg. Tricuspid Valve: The tricuspid valve is abnormal. Tricuspid valve regurgitation is severe. Aortic Valve: The aortic valve is tricuspid. Aortic valve regurgitation is trivial. Aortic valve sclerosis is  present, with no evidence of aortic valve stenosis. Pulmonic Valve: The pulmonic valve was not well visualized. Pulmonic valve regurgitation is trivial. Aorta: The aortic root and ascending aorta are structurally normal, with no evidence of dilitation. Venous: The inferior vena cava is dilated in size with less than 50% respiratory variability, suggesting right atrial pressure of 15 mmHg. IAS/Shunts: The interatrial septum was not well visualized.  LEFT VENTRICLE PLAX 2D LVIDd:         5.10 cm      Diastology LVIDs:         4.00 cm      LV e'  medial:    7.40 cm/s LV PW:         2.00 cm      LV E/e' medial:  22.2 LV IVS:        0.80 cm      LV e' lateral:   7.29 cm/s LVOT diam:     2.40 cm      LV E/e' lateral: 22.5 LV SV:         97 LV SV Index:   51 LVOT Area:     4.52 cm  LV Volumes (MOD) LV vol d, MOD A2C: 135.0 ml LV vol d, MOD A4C: 105.0 ml LV vol s, MOD A2C: 69.9 ml LV vol s, MOD A4C: 59.0 ml LV SV MOD A2C:     65.1 ml LV SV MOD A4C:     105.0 ml LV SV MOD BP:      53.6 ml RIGHT VENTRICLE            IVC RV S prime:     8.33 cm/s  IVC diam: 2.70 cm TAPSE (M-mode): 1.4 cm LEFT ATRIUM            Index        RIGHT ATRIUM           Index LA diam:      5.80 cm  3.03 cm/m   RA Area:     39.60 cm LA Vol (A2C): 72.1 ml  37.62 ml/m  RA Volume:   166.00 ml 86.62 ml/m LA Vol (A4C): 140.0 ml 73.05 ml/m  AORTIC VALVE             PULMONIC VALVE LVOT Vmax:   104.00 cm/s PR End Diast Vel: 2.22 msec LVOT Vmean:  67.300 cm/s LVOT VTI:    0.215 m  AORTA Ao Root diam: 3.60 cm Ao Asc diam:  3.50 cm MITRAL VALVE                TRICUSPID VALVE MV Area (PHT): 3.48 cm     TR Peak grad:   38.2 mmHg MV Area VTI:   2.61 cm     TR Vmax:        309.00 cm/s MV Peak grad:  13.5 mmHg MV Mean grad:  4.0 mmHg     SHUNTS MV Vmax:       1.84 m/s     Systemic VTI:  0.22 m MV Vmean:      88.9 cm/s    Systemic Diam: 2.40 cm MV Decel Time: 218 msec MV E velocity: 164.00 cm/s Lonni Nanas MD Electronically signed by Lonni Nanas MD Signature Date/Time: 02/27/2021/6:41:19 PM    Final (Updated)     Assessment & Plan:   Problem List Items Addressed This Visit     Anemia   Check CBC      CKD (chronic kidney disease),  stage III (HCC)   Cont w/Entresto  49/51 Hydrate well      Coronary atherosclerosis   On ASA,  Lipitor, Bisoprolol        DM2 (diabetes mellitus, type 2) (HCC) - Primary   On ASA, Lipitor      Relevant Orders   Comprehensive metabolic panel with GFR   Gout   No substantial relapse      PAF (paroxysmal atrial fibrillation) (HCC)     Cont w/Bisoprolol , Coumadin       Relevant Orders   Magnesium       No orders of the defined types were placed in this encounter.     Follow-up: Return in about 3 months (around 01/24/2024) for a follow-up visit.  Marolyn Noel, MD

## 2023-10-25 ENCOUNTER — Other Ambulatory Visit (INDEPENDENT_AMBULATORY_CARE_PROVIDER_SITE_OTHER)

## 2023-10-25 DIAGNOSIS — E1159 Type 2 diabetes mellitus with other circulatory complications: Secondary | ICD-10-CM | POA: Diagnosis not present

## 2023-10-25 DIAGNOSIS — I480 Paroxysmal atrial fibrillation: Secondary | ICD-10-CM

## 2023-10-25 LAB — COMPREHENSIVE METABOLIC PANEL WITH GFR
ALT: 15 U/L (ref 0–53)
AST: 25 U/L (ref 0–37)
Albumin: 4.1 g/dL (ref 3.5–5.2)
Alkaline Phosphatase: 77 U/L (ref 39–117)
BUN: 46 mg/dL — ABNORMAL HIGH (ref 6–23)
CO2: 29 meq/L (ref 19–32)
Calcium: 9 mg/dL (ref 8.4–10.5)
Chloride: 95 meq/L — ABNORMAL LOW (ref 96–112)
Creatinine, Ser: 1.44 mg/dL (ref 0.40–1.50)
GFR: 43.45 mL/min — ABNORMAL LOW (ref >60.00)
Glucose, Bld: 94 mg/dL (ref 70–99)
Potassium: 4.2 meq/L (ref 3.5–5.1)
Sodium: 132 meq/L — ABNORMAL LOW (ref 135–145)
Total Bilirubin: 0.9 mg/dL (ref 0.2–1.2)
Total Protein: 6.9 g/dL (ref 6.0–8.3)

## 2023-10-25 LAB — MAGNESIUM: Magnesium: 1.9 mg/dL (ref 1.5–2.5)

## 2023-10-29 ENCOUNTER — Ambulatory Visit: Payer: Self-pay | Admitting: Internal Medicine

## 2023-10-31 ENCOUNTER — Ambulatory Visit

## 2023-11-04 ENCOUNTER — Ambulatory Visit: Attending: Cardiovascular Disease

## 2023-11-04 DIAGNOSIS — G459 Transient cerebral ischemic attack, unspecified: Secondary | ICD-10-CM | POA: Diagnosis not present

## 2023-11-04 DIAGNOSIS — Z9889 Other specified postprocedural states: Secondary | ICD-10-CM

## 2023-11-04 DIAGNOSIS — Z7901 Long term (current) use of anticoagulants: Secondary | ICD-10-CM

## 2023-11-04 DIAGNOSIS — I059 Rheumatic mitral valve disease, unspecified: Secondary | ICD-10-CM | POA: Diagnosis not present

## 2023-11-04 DIAGNOSIS — I480 Paroxysmal atrial fibrillation: Secondary | ICD-10-CM | POA: Diagnosis not present

## 2023-11-04 DIAGNOSIS — I6350 Cerebral infarction due to unspecified occlusion or stenosis of unspecified cerebral artery: Secondary | ICD-10-CM

## 2023-11-04 LAB — POCT INR: INR: 2.8 (ref 2.0–3.0)

## 2023-11-04 NOTE — Progress Notes (Signed)
 INR-2.8 Please see anticoagulation encounter

## 2023-11-04 NOTE — Patient Instructions (Signed)
 Description   Take 1.5 tablets today and then continue to 1 tablet daily except 1.5 tablets on Wednesdays.  Recheck in 4 weeks.  Coumadin  Clinic [phone]

## 2023-11-14 ENCOUNTER — Other Ambulatory Visit: Payer: Self-pay | Admitting: Internal Medicine

## 2023-12-02 ENCOUNTER — Ambulatory Visit

## 2023-12-13 ENCOUNTER — Ambulatory Visit: Attending: Cardiovascular Disease

## 2023-12-13 DIAGNOSIS — I480 Paroxysmal atrial fibrillation: Secondary | ICD-10-CM | POA: Diagnosis not present

## 2023-12-13 DIAGNOSIS — G459 Transient cerebral ischemic attack, unspecified: Secondary | ICD-10-CM

## 2023-12-13 DIAGNOSIS — I059 Rheumatic mitral valve disease, unspecified: Secondary | ICD-10-CM | POA: Diagnosis not present

## 2023-12-13 DIAGNOSIS — I6350 Cerebral infarction due to unspecified occlusion or stenosis of unspecified cerebral artery: Secondary | ICD-10-CM | POA: Diagnosis not present

## 2023-12-13 DIAGNOSIS — Z7901 Long term (current) use of anticoagulants: Secondary | ICD-10-CM

## 2023-12-13 DIAGNOSIS — Z9889 Other specified postprocedural states: Secondary | ICD-10-CM

## 2023-12-13 LAB — POCT INR: INR: 2.8 (ref 2.0–3.0)

## 2023-12-13 NOTE — Progress Notes (Signed)
 INR 2.8 Please see anticoagulation encounter Take 1.5 tablets today and then continue to 1 tablet daily except 1.5 tablets on Wednesdays.  Recheck in 4 weeks.  Coumadin  Clinic [phone]

## 2023-12-13 NOTE — Patient Instructions (Signed)
 Take 1.5 tablets today and then continue to 1 tablet daily except 1.5 tablets on Wednesdays.  Recheck in 4 weeks.  Coumadin  Clinic [phone]

## 2023-12-18 DIAGNOSIS — H40053 Ocular hypertension, bilateral: Secondary | ICD-10-CM | POA: Diagnosis not present

## 2023-12-18 DIAGNOSIS — H53432 Sector or arcuate defects, left eye: Secondary | ICD-10-CM | POA: Diagnosis not present

## 2023-12-18 DIAGNOSIS — H35033 Hypertensive retinopathy, bilateral: Secondary | ICD-10-CM | POA: Diagnosis not present

## 2023-12-23 ENCOUNTER — Ambulatory Visit: Attending: Cardiovascular Disease | Admitting: Cardiovascular Disease

## 2023-12-23 ENCOUNTER — Other Ambulatory Visit: Payer: Self-pay | Admitting: Physician Assistant

## 2023-12-23 ENCOUNTER — Encounter: Payer: Self-pay | Admitting: Cardiovascular Disease

## 2023-12-23 VITALS — BP 134/58 | HR 57 | Ht 69.0 in | Wt 162.4 lb

## 2023-12-23 DIAGNOSIS — I10 Essential (primary) hypertension: Secondary | ICD-10-CM | POA: Diagnosis not present

## 2023-12-23 DIAGNOSIS — I5022 Chronic systolic (congestive) heart failure: Secondary | ICD-10-CM

## 2023-12-23 DIAGNOSIS — I059 Rheumatic mitral valve disease, unspecified: Secondary | ICD-10-CM | POA: Diagnosis not present

## 2023-12-23 DIAGNOSIS — I4821 Permanent atrial fibrillation: Secondary | ICD-10-CM

## 2023-12-23 DIAGNOSIS — Z952 Presence of prosthetic heart valve: Secondary | ICD-10-CM

## 2023-12-23 DIAGNOSIS — E7800 Pure hypercholesterolemia, unspecified: Secondary | ICD-10-CM

## 2023-12-23 MED ORDER — SACUBITRIL-VALSARTAN 49-51 MG PO TABS: 1.0000 | ORAL_TABLET | Freq: Two times a day (BID) | ORAL | 3 refills | Status: AC

## 2023-12-23 MED ORDER — BISOPROLOL FUMARATE 5 MG PO TABS: 5.0000 mg | ORAL_TABLET | Freq: Every day | ORAL | 3 refills | Status: AC

## 2023-12-23 NOTE — Progress Notes (Signed)
 Chief Complaint  Patient presents with   Follow-up    Atrial fibrillation   History of Present Illness: 89 yo male with history of severe mitral regurgitation s/p MVR in 1986, permanent atrial fibrillation, pulmonary HTN, HTN, HLD, DM, CKD stage III, and prior TIA who is here today for cardiac follow up. He had MVR in 1986. He has been on coumadin . He has had prior TIA and has been on ASA and dypridamole. Cardiac monitor in 2020 with atrial fib, occasional PVCs, NSVT. He had worsened LE edema, dyspnea and fatigue in July 2023 secondary to acute CHF and his Lasix  was changed to Torsemide . Echo 11/08/21 with LVEF=35% with global hypokinesis. Mechanical MV working well. He did well with medical therapy  over the next year. Echo September 2024 with LVEF=45-50% with global hypokinesis.  Normal RV function. Severe bi-atrial enlargement. Moderate pulmonic valve insufficiency. Severe tricuspid valve regurgitation. Normally functioning mitral valve replacement. Mild to moderate aortic valve insufficiency.   He is here today for follow up. The patient denies any chest pain, dyspnea, palpitations, lower extremity edema, orthopnea, PND, dizziness, near syncope or syncope.   Primary Care Physician: Garald Karlynn GAILS, MD  Past Medical History:  Diagnosis Date   Blood transfusion without reported diagnosis    C. difficile colitis    Chronic HFrEF (heart failure with reduced ejection fraction) (HCC)    CKD (chronic kidney disease), stage III (HCC)    Elevated TSH    HTN (hypertension)    Hyperlipidemia    Mitral regurgitation    s/p MVR with Medtronic Hall MVR 1986   NSVT (nonsustained ventricular tachycardia) (HCC)    Peripheral edema    venous insufficiency   Permanent atrial fibrillation (HCC)    Pulmonary hypertension (HCC)    Syncope    TIA (transient ischemic attack)    while on coumadin    Type II or unspecified type diabetes mellitus without mention of complication, not stated as  uncontrolled    lifestyle mangement   Ventricular ectopy    symptomatic    Past Surgical History:  Procedure Laterality Date   CHOLECYSTECTOMY, LAPAROSCOPIC  2003   IR PATIENT EVAL TECH 0-60 MINS  01/09/2021   IR RADIOLOGIST EVAL & MGMT  01/19/2021   IR RADIOLOGIST EVAL & MGMT  02/02/2021   LAPAROSCOPIC APPENDECTOMY N/A 12/05/2020   Procedure: APPENDECTOMY LAPAROSCOPIC;  Surgeon: Aron Shoulders, MD;  Location: [facility] OR;  Service: General;  Laterality: N/A;   MITRAL VALVE REPLACEMENT     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    Current Outpatient Medications  Medication Sig Dispense Refill   allopurinol  (ZYLOPRIM ) 100 MG tablet TAKE 1 TABLET(100 MG) BY MOUTH DAILY 90 tablet 3   amoxicillin  (AMOXIL ) 500 MG tablet Take 4 tablets by mouth 1 hour prior to dental procedures and cleanings. 12 tablet 6   aspirin  81 MG EC tablet Take 81 mg by mouth daily.     Cholecalciferol  (VITAMIN D3) 50 MCG (2000 UT) capsule TAKE 1 CAPSULE BY MOUTH EVERY DAY 100 capsule 3   dipyridamole  (PERSANTINE ) 50 MG tablet TAKE 1 TABLET(50 MG) BY MOUTH THREE TIMES DAILY 270 tablet 3   HYDROcodone -acetaminophen  (NORCO) 7.5-325 MG tablet Take 0.5-1 tablets by mouth every 6 (six) hours as needed for severe pain (pain score 7-10). 60 tablet 0   latanoprost  (XALATAN ) 0.005 % ophthalmic solution Place 1 drop into both eyes at bedtime.     loratadine  (CLARITIN ) 10 MG tablet TAKE 1 TABLET(10 MG) BY MOUTH  DAILY 90 tablet 11   predniSONE (DELTASONE) 5 MG tablet Take 1 dose pk by oral route.     torsemide  (DEMADEX ) 20 MG tablet TAKE 3 TABLETS(60 MG) BY MOUTH DAILY 270 tablet 3   warfarin (COUMADIN ) 5 MG tablet Take 1 to 1&1/2 tablets by mouth daily as directed by the Anticoagulation Clinic. 100 tablet 1   bisoprolol  (ZEBETA ) 5 MG tablet Take 1 tablet (5 mg total) by mouth daily. 90 tablet 3   sacubitril -valsartan  (ENTRESTO ) 49-51 MG Take 1 tablet by mouth 2 (two) times daily. 180 tablet 3   No current facility-administered  medications for this visit.    Allergies  Allergen Reactions   Diltiazem  Other (See Comments)    Bradycardia, per pt; tolerates Amlodipine    Sulfa Antibiotics Rash and Other (See Comments)    Reaction not fully recalled    Sulfonamide Derivatives Rash and Other (See Comments)    Reaction not fully recalled     Social History   Socioeconomic History   Marital status: Married    Spouse name: Erminio   Number of children: 2   Years of education: Not on file   Highest education level: Not on file  Occupational History   Occupation: Psychologist, educational in Public affairs consultant: RETIRED  Tobacco Use   Smoking status: Never   Smokeless tobacco: Never  Substance and Sexual Activity   Alcohol use: No    Alcohol/week: 0.0 standard drinks of alcohol   Drug use: No   Sexual activity: Not on file  Other Topics Concern   Not on file  Social History Narrative   HSG, many management courses Married '58, 2 sons - '61, '62   Social Drivers of Corporate investment banker Strain: Not on file  Food Insecurity: No Food Insecurity (05/22/2021)   Hunger Vital Sign    Worried About Running Out of Food in the Last Year: Never true    Ran Out of Food in the Last Year: Never true  Transportation Needs: No Transportation Needs (05/22/2021)   PRAPARE - Administrator, Civil Service (Medical): No    Lack of Transportation (Non-Medical): No  Physical Activity: Not on file  Stress: Not on file  Social Connections: Not on file  Intimate Partner Violence: Not on file    Family History  Problem Relation Age of Onset   Coronary artery disease Father    Diabetes Other    Coronary artery disease Other    Prostate cancer Neg Hx    Colon cancer Neg Hx     Review of Systems:  As stated in the HPI and otherwise negative.   BP (!) 134/58   Pulse (!) 57   Ht 5' 9 (1.753 m)   Wt 162 lb 6.4 oz (73.7 kg)   SpO2 99%   BMI 23.98 kg/m   Physical Examination: General: Well developed, well  nourished, NAD  HEENT: OP clear, mucus membranes moist  SKIN: warm, dry. No rashes. Neuro: No focal deficits  Musculoskeletal: Muscle strength 5/5 all ext  Psychiatric: Mood and affect normal  Neck: No JVD, no carotid bruits, no thyromegaly, no lymphadenopathy.  Lungs:Clear bilaterally, no wheezes, rhonci, crackles Cardiovascular: Irreg irreg. Systolic and diastolic murmur. Valve click.   Abdomen:Soft. Bowel sounds present. Non-tender.  Extremities: No lower extremity edema. Pulses are 2 + in the bilateral DP/PT.  EKG:  EKG is not ordered today. The ekg ordered today demonstrates   Recent Labs: 08/22/2023: Hemoglobin 11.8; Platelets 155.0;  TSH 14.16 10/25/2023: ALT 15; BUN 46; Creatinine, Ser 1.44; Magnesium  1.9; Potassium 4.2; Sodium 132   Lipid Panel    Component Value Date/Time   CHOL 203 (H) 03/13/2023 0910   CHOL 153 05/01/2021 0802   TRIG 89.0 03/13/2023 0910   HDL 68.70 03/13/2023 0910   HDL 66 05/01/2021 0802   CHOLHDL 3 03/13/2023 0910   VLDL 17.8 03/13/2023 0910   LDLCALC 117 (H) 03/13/2023 0910   LDLCALC 73 05/01/2021 0802     Wt Readings from Last 3 Encounters:  12/23/23 162 lb 6.4 oz (73.7 kg)  10/24/23 163 lb (73.9 kg)  08/21/23 167 lb (75.8 kg)    Assessment and Plan:   1. Persistent atrial fibrillation: Rate controlled atrial fibrillation today. Continue beta blocker and coumadin   2. Mitral valve disease: He is s/p mechanical mitral valve replacement. This is working well by echo in September 2024. Continue ASA and coumadin . He will use SBE prophylaxis as needed prior to dental procedures.   3. HTN: BP is controlled. Continue current therapy  4. HLD: Lipids followed in primary care. Continue statin  5. Cardiomyopathy/Chronic systolic CHF: Wt is stable. No volume overload on exam. Continue Torsemide  60 mg per day with an extra 20 mg as needed for weight gain or LE edema. Continue Entresto  and Bisoprolol .   Labs/ tests ordered today include:  No orders of  the defined types were placed in this encounter.  Disposition:   F/U with me in 12 months  Signed, Lonni Cash, MD 12/23/2023 4:42 PM    [facility] [address] [place], [place], [place]  [address] Phone: [phone]; Fax: [phone]

## 2023-12-23 NOTE — Patient Instructions (Signed)
 Medication Instructions:  Your physician recommends that you continue on your current medications as directed. Please refer to the Current Medication list given to you today.  *If you need a refill on your cardiac medications before your next appointment, please call your pharmacy*  Lab Work: none If you have labs (blood work) drawn today and your tests are completely normal, you will receive your results only by: MyChart Message (if you have MyChart) OR A paper copy in the mail If you have any lab test that is abnormal or we need to change your treatment, we will call you to review the results.  Testing/Procedures: none  Follow-Up: At [facility], you and your health needs are our priority.  As part of our continuing mission to provide you with exceptional heart care, our providers are all part of one team.  This team includes your primary Cardiologist (physician) and Advanced Practice Providers or APPs (Physician Assistants and Nurse Practitioners) who all work together to provide you with the care you need, when you need it.  Your next appointment:   12 month(s)  Provider:   Lonni Cash, MD    We recommend signing up for the patient portal called MyChart.  Sign up information is provided on this After Visit Summary.  MyChart is used to connect with patients for Virtual Visits (Telemedicine).  Patients are able to view lab/test results, encounter notes, upcoming appointments, etc.  Non-urgent messages can be sent to your provider as well.   To learn more about what you can do with MyChart, go to [email or web address].   Other Instructions Lab corp locations   [place] - [phone] [address] ([facility]) - [address] [phone] [address] - [address] (1st floor, next to pharmacy)

## 2023-12-27 ENCOUNTER — Ambulatory Visit

## 2023-12-27 DIAGNOSIS — Z23 Encounter for immunization: Secondary | ICD-10-CM

## 2023-12-27 NOTE — Progress Notes (Signed)
 Patient visits today for their high dose flu vaccine. Patient informed of what she had received and tolerated the injection well. Patient notified to reach out to the office if needed.

## 2024-01-10 ENCOUNTER — Ambulatory Visit

## 2024-01-14 ENCOUNTER — Ambulatory Visit: Attending: Cardiovascular Disease | Admitting: Pharmacist

## 2024-01-14 DIAGNOSIS — G459 Transient cerebral ischemic attack, unspecified: Secondary | ICD-10-CM | POA: Diagnosis not present

## 2024-01-14 DIAGNOSIS — Z7901 Long term (current) use of anticoagulants: Secondary | ICD-10-CM

## 2024-01-14 DIAGNOSIS — I059 Rheumatic mitral valve disease, unspecified: Secondary | ICD-10-CM

## 2024-01-14 DIAGNOSIS — I6350 Cerebral infarction due to unspecified occlusion or stenosis of unspecified cerebral artery: Secondary | ICD-10-CM

## 2024-01-14 DIAGNOSIS — I349 Nonrheumatic mitral valve disorder, unspecified: Secondary | ICD-10-CM | POA: Diagnosis not present

## 2024-01-14 DIAGNOSIS — I480 Paroxysmal atrial fibrillation: Secondary | ICD-10-CM | POA: Diagnosis not present

## 2024-01-14 DIAGNOSIS — Z9889 Other specified postprocedural states: Secondary | ICD-10-CM | POA: Diagnosis not present

## 2024-01-14 LAB — POCT INR: INR: 3.9 — AB (ref 2.0–3.0)

## 2024-01-14 NOTE — Progress Notes (Signed)
 Description   INR 3.9: Tomorrow only take 1 tablet and then continue 1 tablet daily except 1.5 tablets on Wednesdays.  Recheck in 4 weeks.  Coumadin  Clinic [phone]

## 2024-01-14 NOTE — Patient Instructions (Signed)
 Description   INR 3.9: Tomorrow only take 1 tablet and then continue 1 tablet daily except 1.5 tablets on Wednesdays.  Recheck in 4 weeks.  Coumadin  Clinic [phone]

## 2024-01-27 ENCOUNTER — Encounter: Payer: Self-pay | Admitting: Internal Medicine

## 2024-01-27 ENCOUNTER — Ambulatory Visit: Admitting: Internal Medicine

## 2024-01-27 VITALS — BP 142/78 | HR 65 | Temp 97.9°F | Ht 69.0 in | Wt 161.6 lb

## 2024-01-27 DIAGNOSIS — I480 Paroxysmal atrial fibrillation: Secondary | ICD-10-CM | POA: Diagnosis not present

## 2024-01-27 DIAGNOSIS — M170 Bilateral primary osteoarthritis of knee: Secondary | ICD-10-CM

## 2024-01-27 DIAGNOSIS — E1159 Type 2 diabetes mellitus with other circulatory complications: Secondary | ICD-10-CM

## 2024-01-27 DIAGNOSIS — N1832 Chronic kidney disease, stage 3b: Secondary | ICD-10-CM | POA: Diagnosis not present

## 2024-01-27 DIAGNOSIS — I5022 Chronic systolic (congestive) heart failure: Secondary | ICD-10-CM | POA: Insufficient documentation

## 2024-01-27 DIAGNOSIS — R109 Unspecified abdominal pain: Secondary | ICD-10-CM

## 2024-01-27 DIAGNOSIS — Z8673 Personal history of transient ischemic attack (TIA), and cerebral infarction without residual deficits: Secondary | ICD-10-CM

## 2024-01-27 NOTE — Assessment & Plan Note (Signed)
Cont w/Entresto 49/51 Hydrate well

## 2024-01-27 NOTE — Assessment & Plan Note (Signed)
 On ASA, Lipitor

## 2024-01-27 NOTE — Assessment & Plan Note (Signed)
 Resolved

## 2024-01-27 NOTE — Progress Notes (Signed)
 Subjective:  Patient ID: Anthony Gould, male    DOB: May 10, 1933  Age: 89 y.o. MRN: [id number]  CC: Medical Management of Chronic Issues (3 Month follow up)   HPI Anthony Gould presents for OA, CHF, CAD, CKD F/u on abd pain - no relapse  Outpatient Medications Prior to Visit  Medication Sig Dispense Refill   allopurinol  (ZYLOPRIM ) 100 MG tablet TAKE 1 TABLET(100 MG) BY MOUTH DAILY 90 tablet 3   amoxicillin  (AMOXIL ) 500 MG tablet Take 4 tablets by mouth 1 hour prior to dental procedures and cleanings. 12 tablet 6   aspirin  81 MG EC tablet Take 81 mg by mouth daily.     bisoprolol  (ZEBETA ) 5 MG tablet Take 1 tablet (5 mg total) by mouth daily. 90 tablet 3   Cholecalciferol  (VITAMIN D3) 50 MCG (2000 UT) capsule TAKE 1 CAPSULE BY MOUTH EVERY DAY 100 capsule 3   dipyridamole  (PERSANTINE ) 50 MG tablet TAKE 1 TABLET(50 MG) BY MOUTH THREE TIMES DAILY 270 tablet 3   HYDROcodone -acetaminophen  (NORCO) 7.5-325 MG tablet Take 0.5-1 tablets by mouth every 6 (six) hours as needed for severe pain (pain score 7-10). 60 tablet 0   latanoprost  (XALATAN ) 0.005 % ophthalmic solution Place 1 drop into both eyes at bedtime.     loratadine  (CLARITIN ) 10 MG tablet TAKE 1 TABLET(10 MG) BY MOUTH DAILY 90 tablet 11   predniSONE (DELTASONE) 5 MG tablet Take 1 dose pk by oral route.     sacubitril -valsartan  (ENTRESTO ) 49-51 MG Take 1 tablet by mouth 2 (two) times daily. 180 tablet 3   torsemide  (DEMADEX ) 20 MG tablet TAKE 3 TABLETS(60 MG) BY MOUTH DAILY 270 tablet 3   warfarin (COUMADIN ) 5 MG tablet Take 1 to 1&1/2 tablets by mouth daily as directed by the Anticoagulation Clinic. 100 tablet 1   No facility-administered medications prior to visit.    ROS: Review of Systems  Constitutional:  Negative for appetite change, fatigue and unexpected weight change.  HENT:  Negative for congestion, nosebleeds, sneezing, sore throat and trouble swallowing.   Eyes:  Negative for itching and visual disturbance.   Respiratory:  Negative for cough.   Cardiovascular:  Negative for chest pain, palpitations and leg swelling.  Gastrointestinal:  Negative for abdominal distention, blood in stool, diarrhea and nausea.  Genitourinary:  Negative for frequency and hematuria.  Musculoskeletal:  Positive for arthralgias and gait problem. Negative for back pain, joint swelling and neck pain.  Skin:  Negative for rash.  Neurological:  Negative for dizziness, tremors, speech difficulty and weakness.  Psychiatric/Behavioral:  Negative for agitation, dysphoric mood, sleep disturbance and suicidal ideas. The patient is not nervous/anxious.     Objective:  BP (!) 142/78   Pulse 65   Temp 97.9 F (36.6 C)   Ht 5' 9 (1.753 m)   Wt 161 lb 9.6 oz (73.3 kg)   SpO2 99%   BMI 23.86 kg/m   BP Readings from Last 3 Encounters:  01/27/24 (!) 142/78  12/23/23 (!) 134/58  10/24/23 132/76    Wt Readings from Last 3 Encounters:  01/27/24 161 lb 9.6 oz (73.3 kg)  12/23/23 162 lb 6.4 oz (73.7 kg)  10/24/23 163 lb (73.9 kg)    Physical Exam Constitutional:      General: He is not in acute distress.    Appearance: He is well-developed.     Comments: NAD  Eyes:     Conjunctiva/sclera: Conjunctivae normal.     Pupils: Pupils are equal, round, and reactive  to light.  Neck:     Thyroid : No thyromegaly.     Vascular: No JVD.  Cardiovascular:     Rate and Rhythm: Normal rate and regular rhythm.     Heart sounds: Normal heart sounds. No murmur heard.    No friction rub. No gallop.  Pulmonary:     Effort: Pulmonary effort is normal. No respiratory distress.     Breath sounds: Normal breath sounds. No wheezing or rales.  Chest:     Chest wall: No tenderness.  Abdominal:     General: Bowel sounds are normal. There is no distension.     Palpations: Abdomen is soft. There is no mass.     Tenderness: There is no abdominal tenderness. There is no guarding or rebound.  Musculoskeletal:        General: Tenderness  present. Normal range of motion.     Cervical back: Normal range of motion.     Right lower leg: No edema.     Left lower leg: No edema.  Lymphadenopathy:     Cervical: No cervical adenopathy.  Skin:    General: Skin is warm and dry.     Findings: No rash.  Neurological:     Mental Status: He is alert and oriented to person, place, and time.     Cranial Nerves: No cranial nerve deficit.     Motor: No abnormal muscle tone.     Coordination: Coordination normal.     Gait: Gait normal.     Deep Tendon Reflexes: Reflexes are normal and symmetric.  Psychiatric:        Behavior: Behavior normal.        Thought Content: Thought content normal.        Judgment: Judgment normal.   L knee w/pain  Lab Results  Component Value Date   WBC 4.5 08/22/2023   HGB 11.8 (L) 08/22/2023   HCT 35.1 (L) 08/22/2023   PLT 155.0 08/22/2023   GLUCOSE 94 10/25/2023   CHOL 203 (H) 03/13/2023   TRIG 89.0 03/13/2023   HDL 68.70 03/13/2023   LDLCALC 117 (H) 03/13/2023   ALT 15 10/25/2023   AST 25 10/25/2023   NA 132 (L) 10/25/2023   K 4.2 10/25/2023   CL 95 (L) 10/25/2023   CREATININE 1.44 10/25/2023   BUN 46 (H) 10/25/2023   CO2 29 10/25/2023   TSH 14.16 (H) 08/22/2023   PSA 0.87 10/20/2014   INR 3.9 (A) 01/14/2024   HGBA1C 5.7 05/23/2023    ECHOCARDIOGRAM COMPLETE Result Date: 02/27/2021    ECHOCARDIOGRAM REPORT   Patient Name:   Anthony Gould Date of Exam: 02/27/2021 Medical Rec #:  [id number]         Height:       69.0 in Accession #:    [id number]        Weight:       167.6 lb Date of Birth:  [birth year]         BSA:          1.917 m Patient Age:    87 years          BP:           124/56 mmHg Patient Gender: M                 HR:           69 bpm. Exam Location:  Inpatient Procedure: 2D Echo, 3D Echo, Cardiac Doppler and Color Doppler Indications:  I50.40* Unspecified combined systolic (congestive) and                  diastolic (congestive) heart failure  History:         Patient has prior  history of Echocardiogram examinations, most                  recent 03/09/2019. CHF, Abnormal ECG, Pulmonary HTN and TIA,                  Mitral Valve Disease, Arrythmias:Atrial Fibrillation and NSVT,                  Signs/Symptoms:Chest Pain and Syncope; Risk                  Factors:Hypertension and Diabetes. Mechanical mitral valve.                   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 Procedure Date: 30.  Sonographer:     Ellouise Mose RDCS Referring Phys:  [id number] [facility] Diagnosing Phys: Lonni Nanas MD  Sonographer Comments: Technically difficult study due to poor echo windows. IMPRESSIONS  1. Left ventricular ejection fraction, by estimation, is 40 to 45%. The left ventricle has mildly decreased function. The left ventricle demonstrates regional wall motion abnormalities (see scoring diagram/findings for description). Septal hypokinesis. There is moderate left ventricular hypertrophy. Left ventricular diastolic parameters are indeterminate.  2. Right ventricular systolic function is moderately reduced. The right ventricular size is moderately enlarged. There is moderately elevated pulmonary artery systolic pressure. The estimated right ventricular systolic pressure is 53.2 mmHg.  3. Left atrial size was severely dilated.  4. Right atrial size was severely dilated.  5. 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 Procedure Date: 43. Trivial mitral valve regurgitation.     MG at 63bpm, EOA 2.4 cm^2  6. The tricuspid valve is abnormal. Tricuspid valve regurgitation is severe.  7. The aortic valve is tricuspid. Aortic valve regurgitation is trivial. Aortic valve sclerosis is present, with no evidence of aortic valve stenosis.  8. The inferior vena cava is dilated in size with <50% respiratory variability, suggesting right atrial pressure of 15 mmHg. FINDINGS  Left Ventricle: Left  ventricular ejection fraction, by estimation, is 40 to 45%. The left ventricle has mildly decreased function. The left ventricle demonstrates regional wall motion abnormalities. The left ventricular internal cavity size was normal in size. There is moderate left ventricular hypertrophy. Left ventricular diastolic parameters are indeterminate.  LV Wall Scoring: The entire septum is hypokinetic. The entire anterior wall, entire lateral wall, entire inferior wall, and apex are normal. Right Ventricle: The right ventricular size is moderately enlarged. Right vetricular wall thickness was not well visualized. Right ventricular systolic function is moderately reduced. There is moderately elevated pulmonary artery systolic pressure. The tricuspid regurgitant velocity is 3.09 m/s, and with an assumed right atrial pressure of 15 mmHg, the estimated right ventricular systolic pressure is 53.2 mmHg. Left Atrium: Left atrial size was severely dilated. Right Atrium: Right atrial size was severely dilated. Pericardium: There is no evidence of pericardial effusion. Mitral Valve: The mitral valve has been repaired/replaced. Trivial mitral valve regurgitation. There is a unknown size Medtronic tilting disc valve present in the mitral position. Procedure Date: 66. MV peak gradient, 13.5  mmHg. The mean mitral valve gradient is 4.0 mmHg. Tricuspid Valve: The tricuspid valve is abnormal. Tricuspid valve regurgitation is severe. Aortic Valve: The aortic valve is tricuspid. Aortic valve regurgitation is trivial. Aortic valve sclerosis is present, with no evidence of aortic valve stenosis. Pulmonic Valve: The pulmonic valve was not well visualized. Pulmonic valve regurgitation is trivial. Aorta: The aortic root and ascending aorta are structurally normal, with no evidence of dilitation. Venous: The inferior vena cava is dilated in size with less than 50% respiratory variability, suggesting right atrial pressure of 15 mmHg. IAS/Shunts:  The interatrial septum was not well visualized.  LEFT VENTRICLE PLAX 2D LVIDd:         5.10 cm      Diastology LVIDs:         4.00 cm      LV e' medial:    7.40 cm/s LV PW:         2.00 cm      LV E/e' medial:  22.2 LV IVS:        0.80 cm      LV e' lateral:   7.29 cm/s LVOT diam:     2.40 cm      LV E/e' lateral: 22.5 LV SV:         97 LV SV Index:   51 LVOT Area:     4.52 cm  LV Volumes (MOD) LV vol d, MOD A2C: 135.0 ml LV vol d, MOD A4C: 105.0 ml LV vol s, MOD A2C: 69.9 ml LV vol s, MOD A4C: 59.0 ml LV SV MOD A2C:     65.1 ml LV SV MOD A4C:     105.0 ml LV SV MOD BP:      53.6 ml RIGHT VENTRICLE            IVC RV S prime:     8.33 cm/s  IVC diam: 2.70 cm TAPSE (M-mode): 1.4 cm LEFT ATRIUM            Index        RIGHT ATRIUM           Index LA diam:      5.80 cm  3.03 cm/m   RA Area:     39.60 cm LA Vol (A2C): 72.1 ml  37.62 ml/m  RA Volume:   166.00 ml 86.62 ml/m LA Vol (A4C): 140.0 ml 73.05 ml/m  AORTIC VALVE             PULMONIC VALVE LVOT Vmax:   104.00 cm/s PR End Diast Vel: 2.22 msec LVOT Vmean:  67.300 cm/s LVOT VTI:    0.215 m  AORTA Ao Root diam: 3.60 cm Ao Asc diam:  3.50 cm MITRAL VALVE                TRICUSPID VALVE MV Area (PHT): 3.48 cm     TR Peak grad:   38.2 mmHg MV Area VTI:   2.61 cm     TR Vmax:        309.00 cm/s MV Peak grad:  13.5 mmHg MV Mean grad:  4.0 mmHg     SHUNTS MV Vmax:       1.84 m/s     Systemic VTI:  0.22 m MV Vmean:      88.9 cm/s    Systemic Diam: 2.40 cm MV Decel Time: 218 msec MV E velocity: 164.00 cm/s Lonni Nanas MD Electronically signed by Lonni Nanas MD Signature Date/Time: 02/27/2021/6:41:19 PM  Final (Updated)     Assessment & Plan:   Problem List Items Addressed This Visit     Abdominal pain   Resolved      Chronic HFrEF (heart failure with reduced ejection fraction) (HCC)   On Entresto  F/u w/Cardiology      CKD (chronic kidney disease), stage III (HCC)   Cont w/Entresto  49/51 Hydrate well      DM2 (diabetes mellitus,  type 2) (HCC)   On ASA, Lipitor      History of CVA (cerebrovascular accident)   On Diltiazem , Bisoprolol , Enalapril , Furosemide , Coumadin , ASA Cont w/Bisoprolol , Coumadin       Knee osteoarthritis - Primary   L>>R pain Aspercream prn F/u w/Dr Aluisio       PAF (paroxysmal atrial fibrillation) (HCC)    Cont w/Bisoprolol , Coumadin          No orders of the defined types were placed in this encounter.     Follow-up: No follow-ups on file.  Marolyn Noel, MD

## 2024-01-27 NOTE — Assessment & Plan Note (Signed)
On Diltiazem, Bisoprolol, Enalapril, Furosemide, Coumadin, ASA Cont w/Bisoprolol, Coumadin

## 2024-01-27 NOTE — Assessment & Plan Note (Signed)
 On Entresto  F/u w/Cardiology

## 2024-01-27 NOTE — Assessment & Plan Note (Signed)
 L>>R pain Aspercream prn F/u w/Dr Aluisio

## 2024-01-30 ENCOUNTER — Other Ambulatory Visit: Payer: Self-pay

## 2024-01-30 ENCOUNTER — Telehealth: Payer: Self-pay | Admitting: Cardiovascular Disease

## 2024-01-30 DIAGNOSIS — I480 Paroxysmal atrial fibrillation: Secondary | ICD-10-CM

## 2024-01-30 MED ORDER — WARFARIN SODIUM 5 MG PO TABS: ORAL_TABLET | ORAL | 1 refills | Status: AC

## 2024-01-30 NOTE — Telephone Encounter (Signed)
*  STAT* If patient is at the pharmacy, call can be transferred to refill team.   1. Which medications need to be refilled? (please list name of each medication and dose if known) warfarin (COUMADIN ) 5 MG tablet    2. Would you like to learn more about the convenience, safety, & potential cost savings by using the [facility]?      3. Are you open to using the [facility] (Type [facility].  ).   4. Which pharmacy/location (including street and city if local pharmacy) is medication to be sent to? [id number] - [place], [place] - [address]    5. Do they need a 30 day or 90 day supply? 30 day

## 2024-01-31 ENCOUNTER — Other Ambulatory Visit: Payer: Self-pay | Admitting: Internal Medicine

## 2024-01-31 NOTE — Telephone Encounter (Signed)
 Copied from CRM [id number]. Topic: Clinical - Medication Refill >> Jan 31, 2024 10:15 AM Ahlexyia S wrote: Medication: torsemide  (DEMADEX ) 20 MG tablet  Has the patient contacted their pharmacy? Yes, pharmacy contacted pt and was told that there are no remaining refills. (Agent: If no, request that the patient contact the pharmacy for the refill. If patient does not wish to contact the pharmacy document the reason why and proceed with request.) (Agent: If yes, when and what did the pharmacy advise?)  This is the patient's preferred pharmacy:  [id number] - [place], [place] - [address] AT [facility] [address] [place] [address] Phone: [phone] Fax: [phone]  Is this the correct pharmacy for this prescription? Yes If no, delete pharmacy and type the correct one.   Has the prescription been filled recently? No  Is the patient out of the medication? Yes  Has the patient been seen for an appointment in the last year OR does the patient have an upcoming appointment? Yes  Can we respond through MyChart? No, prefers phone calls.  Agent: Please be advised that Rx refills may take up to 3 business days. We ask that you follow-up with your pharmacy.

## 2024-02-03 ENCOUNTER — Other Ambulatory Visit: Payer: Self-pay | Admitting: Internal Medicine

## 2024-02-04 MED ORDER — TORSEMIDE 20 MG PO TABS: 20.0000 mg | ORAL_TABLET | Freq: Every day | ORAL | 3 refills | Status: DC

## 2024-02-11 ENCOUNTER — Ambulatory Visit: Attending: Cardiovascular Disease | Admitting: Pharmacist

## 2024-02-11 DIAGNOSIS — G459 Transient cerebral ischemic attack, unspecified: Secondary | ICD-10-CM | POA: Diagnosis not present

## 2024-02-11 DIAGNOSIS — I349 Nonrheumatic mitral valve disorder, unspecified: Secondary | ICD-10-CM | POA: Diagnosis not present

## 2024-02-11 DIAGNOSIS — I6350 Cerebral infarction due to unspecified occlusion or stenosis of unspecified cerebral artery: Secondary | ICD-10-CM | POA: Diagnosis not present

## 2024-02-11 DIAGNOSIS — I059 Rheumatic mitral valve disease, unspecified: Secondary | ICD-10-CM

## 2024-02-11 DIAGNOSIS — Z9889 Other specified postprocedural states: Secondary | ICD-10-CM

## 2024-02-11 DIAGNOSIS — I480 Paroxysmal atrial fibrillation: Secondary | ICD-10-CM

## 2024-02-11 DIAGNOSIS — Z7901 Long term (current) use of anticoagulants: Secondary | ICD-10-CM

## 2024-02-11 LAB — POCT INR: INR: 3.4 — AB (ref 2.0–3.0)

## 2024-02-11 NOTE — Progress Notes (Signed)
 Description   INR 3.4: Tomorrow only take 1 tablet and then continue 1 tablet daily except 1.5 tablets on Wednesdays.  Recheck in 4 weeks.  Coumadin  Clinic [phone]

## 2024-02-11 NOTE — Patient Instructions (Signed)
 Description   INR 3.4: Tomorrow only take 1 tablet and then continue 1 tablet daily except 1.5 tablets on Wednesdays.  Recheck in 4 weeks.  Coumadin  Clinic [phone]

## 2024-03-04 ENCOUNTER — Telehealth: Payer: Self-pay

## 2024-03-04 NOTE — Telephone Encounter (Signed)
 Copied from CRM #[id number]. Topic: Clinical - Prescription Issue >> Mar 04, 2024  9:02 AM Willma SAUNDERS wrote: Reason for CRM: Patient recently picked up his prescription for torsemide  (DEMADEX ) 20 MG tablet. States he is supposed to take it 3 times daily and supposed to get 270 pills, but he was only given 30 pills upon pickup.  Patient can be reached at [phone]

## 2024-03-09 NOTE — Telephone Encounter (Unsigned)
 Copied from CRM [id number]. Topic: Clinical - Prescription Issue >> Mar 09, 2024 12:38 PM Brittany M wrote: Reason for CRM: Patient calling to check on prescription of torsemide  (DEMADEX ) 20 MG tablet.He said that the medication needs to be changed to 3 a day and count 270 pills. He was only given 30 days and to take 1 a day..- patient only has enough left for 3 days.

## 2024-03-10 ENCOUNTER — Other Ambulatory Visit: Payer: Self-pay

## 2024-03-10 ENCOUNTER — Ambulatory Visit

## 2024-03-10 MED ORDER — TORSEMIDE 20 MG PO TABS: 60.0000 mg | ORAL_TABLET | Freq: Every day | ORAL | 3 refills | Status: AC

## 2024-03-10 NOTE — Telephone Encounter (Signed)
 Pts medication has been sent to his pharmacy with the correct dosage and amount... The following is from the pts pharmacy Receipt confirmed by pharmacy (02/04/2024 10:29 AM EST)   Please inform the pt of the above information upon his call back to the clinic.

## 2024-03-12 ENCOUNTER — Ambulatory Visit: Attending: Cardiovascular Disease

## 2024-03-12 DIAGNOSIS — Z9889 Other specified postprocedural states: Secondary | ICD-10-CM

## 2024-03-12 DIAGNOSIS — I480 Paroxysmal atrial fibrillation: Secondary | ICD-10-CM | POA: Diagnosis not present

## 2024-03-12 DIAGNOSIS — I349 Nonrheumatic mitral valve disorder, unspecified: Secondary | ICD-10-CM | POA: Diagnosis not present

## 2024-03-12 DIAGNOSIS — I6350 Cerebral infarction due to unspecified occlusion or stenosis of unspecified cerebral artery: Secondary | ICD-10-CM

## 2024-03-12 DIAGNOSIS — I059 Rheumatic mitral valve disease, unspecified: Secondary | ICD-10-CM

## 2024-03-12 DIAGNOSIS — G459 Transient cerebral ischemic attack, unspecified: Secondary | ICD-10-CM

## 2024-03-12 DIAGNOSIS — Z7901 Long term (current) use of anticoagulants: Secondary | ICD-10-CM

## 2024-03-12 DIAGNOSIS — Z5181 Encounter for therapeutic drug level monitoring: Secondary | ICD-10-CM | POA: Diagnosis not present

## 2024-03-12 LAB — POCT INR: POC INR: 3.3

## 2024-03-12 NOTE — Patient Instructions (Signed)
 Description   INR 3.3: Continue 1 tablet daily except 1.5 tablets on Wednesdays.  Recheck in 5 weeks.  Coumadin  Clinic [phone]

## 2024-03-12 NOTE — Progress Notes (Signed)
 Lab Results  Component Value Date   INR 3.3 03/12/2024   INR 3.4 (A) 02/11/2024   INR 3.9 (A) 01/14/2024   PROTIME 22.2 09/06/2008    Description   INR 3.3: Continue 1 tablet daily except 1.5 tablets on Wednesdays.  Recheck in 5 weeks.  Coumadin  Clinic [phone]

## 2024-04-14 ENCOUNTER — Other Ambulatory Visit: Payer: Self-pay | Admitting: Internal Medicine

## 2024-04-16 ENCOUNTER — Ambulatory Visit

## 2024-04-16 DIAGNOSIS — Z7901 Long term (current) use of anticoagulants: Secondary | ICD-10-CM

## 2024-04-16 DIAGNOSIS — G459 Transient cerebral ischemic attack, unspecified: Secondary | ICD-10-CM | POA: Diagnosis not present

## 2024-04-16 DIAGNOSIS — I349 Nonrheumatic mitral valve disorder, unspecified: Secondary | ICD-10-CM

## 2024-04-16 DIAGNOSIS — Z9889 Other specified postprocedural states: Secondary | ICD-10-CM

## 2024-04-16 DIAGNOSIS — I480 Paroxysmal atrial fibrillation: Secondary | ICD-10-CM

## 2024-04-16 DIAGNOSIS — I6350 Cerebral infarction due to unspecified occlusion or stenosis of unspecified cerebral artery: Secondary | ICD-10-CM | POA: Diagnosis not present

## 2024-04-16 DIAGNOSIS — I059 Rheumatic mitral valve disease, unspecified: Secondary | ICD-10-CM

## 2024-04-16 LAB — POCT INR: INR: 3.3 — AB (ref 2.0–3.0)

## 2024-04-16 NOTE — Patient Instructions (Signed)
 Continue 1 tablet daily except 1.5 tablets on Wednesdays.  Recheck in 5 weeks.  Coumadin  Clinic [phone]

## 2024-04-16 NOTE — Progress Notes (Signed)
 INR 3.3   Continue 1 tablet daily except 1.5 tablets on Wednesdays.  Recheck in 5 weeks.  Coumadin  Clinic [phone]

## 2024-04-28 ENCOUNTER — Encounter: Payer: Self-pay | Admitting: Internal Medicine

## 2024-04-28 ENCOUNTER — Ambulatory Visit: Admitting: Internal Medicine

## 2024-04-28 VITALS — BP 146/78 | HR 63 | Ht 69.0 in | Wt 160.6 lb

## 2024-04-28 DIAGNOSIS — N1832 Chronic kidney disease, stage 3b: Secondary | ICD-10-CM

## 2024-04-28 DIAGNOSIS — M1000 Idiopathic gout, unspecified site: Secondary | ICD-10-CM

## 2024-04-28 DIAGNOSIS — E1159 Type 2 diabetes mellitus with other circulatory complications: Secondary | ICD-10-CM

## 2024-04-28 DIAGNOSIS — E559 Vitamin D deficiency, unspecified: Secondary | ICD-10-CM

## 2024-04-28 DIAGNOSIS — I4819 Other persistent atrial fibrillation: Secondary | ICD-10-CM

## 2024-04-28 DIAGNOSIS — I480 Paroxysmal atrial fibrillation: Secondary | ICD-10-CM

## 2024-04-28 DIAGNOSIS — K7460 Unspecified cirrhosis of liver: Secondary | ICD-10-CM

## 2024-04-28 DIAGNOSIS — Z7901 Long term (current) use of anticoagulants: Secondary | ICD-10-CM

## 2024-04-28 DIAGNOSIS — I10 Essential (primary) hypertension: Secondary | ICD-10-CM

## 2024-04-28 DIAGNOSIS — I5032 Chronic diastolic (congestive) heart failure: Secondary | ICD-10-CM

## 2024-04-28 DIAGNOSIS — I5033 Acute on chronic diastolic (congestive) heart failure: Secondary | ICD-10-CM

## 2024-04-28 MED ORDER — HYDROCODONE-ACETAMINOPHEN 7.5-325 MG PO TABS: 0.5000 | ORAL_TABLET | Freq: Four times a day (QID) | ORAL | 0 refills | Status: AC | PRN

## 2024-04-28 NOTE — Assessment & Plan Note (Signed)
Cont w/Entresto 49/51 Hydrate well

## 2024-04-28 NOTE — Assessment & Plan Note (Signed)
SBP 130-140 at home - OK per Dr Shirlee Latch Cont w/Bisoprolol, Diltiazem d/c, ASA, Coumadin, Norvasc

## 2024-04-28 NOTE — Assessment & Plan Note (Signed)
 On Coumadin 

## 2024-04-28 NOTE — Assessment & Plan Note (Signed)
 On Entresto qd

## 2024-04-28 NOTE — Assessment & Plan Note (Signed)
Norco prn ? Potential benefits of a short or long term opioids use as well as potential risks (i.e. addiction risk, apnea etc) and complications (i.e. Somnolence, constipation and others) were explained to the patient and were aknowledged. ? ?

## 2024-04-28 NOTE — Assessment & Plan Note (Signed)
ECHO is better

## 2024-04-28 NOTE — Assessment & Plan Note (Signed)
 On ASA, Lipitor

## 2024-04-28 NOTE — Assessment & Plan Note (Signed)
 On Vit D

## 2024-04-29 ENCOUNTER — Other Ambulatory Visit

## 2024-04-29 DIAGNOSIS — E1159 Type 2 diabetes mellitus with other circulatory complications: Secondary | ICD-10-CM | POA: Diagnosis not present

## 2024-04-29 DIAGNOSIS — I480 Paroxysmal atrial fibrillation: Secondary | ICD-10-CM

## 2024-04-29 DIAGNOSIS — K7460 Unspecified cirrhosis of liver: Secondary | ICD-10-CM

## 2024-04-29 DIAGNOSIS — E559 Vitamin D deficiency, unspecified: Secondary | ICD-10-CM | POA: Diagnosis not present

## 2024-04-29 LAB — HEMOGLOBIN A1C: Hgb A1c MFr Bld: 5.6 % (ref 4.6–6.5)

## 2024-04-29 LAB — CBC WITH DIFFERENTIAL/PLATELET
Basophils Absolute: 0 10*3/uL (ref 0.0–0.1)
Basophils Relative: 1.1 % (ref 0.0–3.0)
Eosinophils Absolute: 0.5 10*3/uL (ref 0.0–0.7)
Eosinophils Relative: 12.6 % — ABNORMAL HIGH (ref 0.0–5.0)
HCT: 34.9 % — ABNORMAL LOW (ref 39.0–52.0)
Hemoglobin: 11.8 g/dL — ABNORMAL LOW (ref 13.0–17.0)
Lymphocytes Relative: 28.9 % (ref 12.0–46.0)
Lymphs Abs: 1.2 10*3/uL (ref 0.7–4.0)
MCHC: 33.8 g/dL (ref 30.0–36.0)
MCV: 98.3 fl (ref 78.0–100.0)
Monocytes Absolute: 0.3 10*3/uL (ref 0.1–1.0)
Monocytes Relative: 8.1 % (ref 3.0–12.0)
Neutro Abs: 2.1 10*3/uL (ref 1.4–7.7)
Neutrophils Relative %: 49.3 % (ref 43.0–77.0)
Platelets: 156 10*3/uL (ref 150.0–400.0)
RBC: 3.56 Mil/uL — ABNORMAL LOW (ref 4.22–5.81)
RDW: 15.1 % (ref 11.5–15.5)
WBC: 4.3 10*3/uL (ref 4.0–10.5)

## 2024-04-29 LAB — COMPREHENSIVE METABOLIC PANEL WITH GFR
ALT: 19 U/L (ref 3–53)
AST: 25 U/L (ref 5–37)
Albumin: 4.2 g/dL (ref 3.5–5.2)
Alkaline Phosphatase: 86 U/L (ref 39–117)
BUN: 37 mg/dL — ABNORMAL HIGH (ref 6–23)
CO2: 30 meq/L (ref 19–32)
Calcium: 9.3 mg/dL (ref 8.4–10.5)
Chloride: 100 meq/L (ref 96–112)
Creatinine, Ser: 1.41 mg/dL (ref 0.40–1.50)
GFR: 44.4 mL/min — ABNORMAL LOW
Glucose, Bld: 100 mg/dL — ABNORMAL HIGH (ref 70–99)
Potassium: 3.8 meq/L (ref 3.5–5.1)
Sodium: 138 meq/L (ref 135–145)
Total Bilirubin: 0.8 mg/dL (ref 0.2–1.2)
Total Protein: 7 g/dL (ref 6.0–8.3)

## 2024-04-29 LAB — VITAMIN D 25 HYDROXY (VIT D DEFICIENCY, FRACTURES): VITD: 57.53 ng/mL (ref 30.00–100.00)

## 2024-04-29 LAB — TSH: TSH: 10.94 u[IU]/mL — ABNORMAL HIGH (ref 0.35–5.50)

## 2024-04-29 LAB — T4, FREE: Free T4: 0.8 ng/dL (ref 0.60–1.60)

## 2024-04-30 ENCOUNTER — Ambulatory Visit: Payer: Self-pay | Admitting: Internal Medicine

## 2024-05-01 LAB — AFP TUMOR MARKER: AFP-Tumor Marker: 3.4 ng/mL

## 2024-05-21 ENCOUNTER — Ambulatory Visit

## 2024-07-27 ENCOUNTER — Ambulatory Visit: Admitting: Internal Medicine
